# Patient Record
Sex: Female | Born: 1955 | Race: Black or African American | Hispanic: No | Marital: Single | Smoking: Never smoker
Health system: Southern US, Community
[De-identification: ages and names within clinical notes are randomized; demographics above are authoritative.]

## PROBLEM LIST (undated history)

## (undated) DIAGNOSIS — D649 Anemia, unspecified: Secondary | ICD-10-CM

## (undated) DIAGNOSIS — I4891 Unspecified atrial fibrillation: Secondary | ICD-10-CM

## (undated) DIAGNOSIS — E119 Type 2 diabetes mellitus without complications: Secondary | ICD-10-CM

## (undated) DIAGNOSIS — G43909 Migraine, unspecified, not intractable, without status migrainosus: Secondary | ICD-10-CM

## (undated) DIAGNOSIS — F329 Major depressive disorder, single episode, unspecified: Secondary | ICD-10-CM

## (undated) DIAGNOSIS — M797 Fibromyalgia: Secondary | ICD-10-CM

## (undated) DIAGNOSIS — I3139 Other pericardial effusion (noninflammatory): Secondary | ICD-10-CM

## (undated) DIAGNOSIS — E240 Pituitary-dependent Cushing's disease: Secondary | ICD-10-CM

## (undated) DIAGNOSIS — G5622 Lesion of ulnar nerve, left upper limb: Secondary | ICD-10-CM

## (undated) DIAGNOSIS — I10 Essential (primary) hypertension: Secondary | ICD-10-CM

## (undated) DIAGNOSIS — D3500 Benign neoplasm of unspecified adrenal gland: Secondary | ICD-10-CM

## (undated) DIAGNOSIS — E785 Hyperlipidemia, unspecified: Secondary | ICD-10-CM

## (undated) DIAGNOSIS — J45909 Unspecified asthma, uncomplicated: Secondary | ICD-10-CM

## (undated) DIAGNOSIS — F32A Depression, unspecified: Secondary | ICD-10-CM

## (undated) DIAGNOSIS — N189 Chronic kidney disease, unspecified: Secondary | ICD-10-CM

## (undated) DIAGNOSIS — I313 Pericardial effusion (noninflammatory): Secondary | ICD-10-CM

## (undated) DIAGNOSIS — T783XXA Angioneurotic edema, initial encounter: Secondary | ICD-10-CM

## (undated) DIAGNOSIS — G5711 Meralgia paresthetica, right lower limb: Secondary | ICD-10-CM

## (undated) DIAGNOSIS — L509 Urticaria, unspecified: Secondary | ICD-10-CM

## (undated) DIAGNOSIS — E039 Hypothyroidism, unspecified: Secondary | ICD-10-CM

## (undated) DIAGNOSIS — R809 Proteinuria, unspecified: Secondary | ICD-10-CM

## (undated) DIAGNOSIS — M069 Rheumatoid arthritis, unspecified: Secondary | ICD-10-CM

## (undated) HISTORY — DX: Type 2 diabetes mellitus without complications: E11.9

## (undated) HISTORY — DX: Fibromyalgia: M79.7

## (undated) HISTORY — DX: Pituitary-dependent Cushing's disease: E24.0

## (undated) HISTORY — DX: Hypothyroidism, unspecified: E03.9

## (undated) HISTORY — DX: Pericardial effusion (noninflammatory): I31.3

## (undated) HISTORY — DX: Anemia, unspecified: D64.9

## (undated) HISTORY — DX: Migraine, unspecified, not intractable, without status migrainosus: G43.909

## (undated) HISTORY — DX: Depression, unspecified: F32.A

## (undated) HISTORY — DX: Major depressive disorder, single episode, unspecified: F32.9

## (undated) HISTORY — PX: ADENOIDECTOMY: SUR15

## (undated) HISTORY — DX: Other pericardial effusion (noninflammatory): I31.39

## (undated) HISTORY — DX: Unspecified asthma, uncomplicated: J45.909

## (undated) HISTORY — DX: Angioneurotic edema, initial encounter: T78.3XXA

## (undated) HISTORY — PX: APPENDECTOMY: SHX54

## (undated) HISTORY — DX: Benign neoplasm of unspecified adrenal gland: D35.00

## (undated) HISTORY — DX: Urticaria, unspecified: L50.9

## (undated) HISTORY — DX: Essential (primary) hypertension: I10

## (undated) HISTORY — PX: TOTAL VAGINAL HYSTERECTOMY: SHX2548

## (undated) HISTORY — DX: Proteinuria, unspecified: R80.9

## (undated) HISTORY — PX: CYST REMOVAL LEG: SHX6280

## (undated) HISTORY — PX: OTHER SURGICAL HISTORY: SHX169

## (undated) HISTORY — PX: TONSILLECTOMY: SUR1361

## (undated) HISTORY — DX: Unspecified atrial fibrillation: I48.91

## (undated) HISTORY — DX: Meralgia paresthetica, right lower limb: G57.11

## (undated) HISTORY — DX: Lesion of ulnar nerve, left upper limb: G56.22

## (undated) HISTORY — PX: UMBILICAL HERNIA REPAIR: SUR1181

## (undated) HISTORY — DX: Hyperlipidemia, unspecified: E78.5

## (undated) HISTORY — DX: Rheumatoid arthritis, unspecified: M06.9

---

## 1999-04-24 ENCOUNTER — Encounter: Admission: RE | Admit: 1999-04-24 | Discharge: 1999-07-23 | Payer: Self-pay | Admitting: Endocrinology

## 1999-05-25 ENCOUNTER — Encounter (HOSPITAL_BASED_OUTPATIENT_CLINIC_OR_DEPARTMENT_OTHER): Payer: Self-pay | Admitting: General Surgery

## 1999-05-29 ENCOUNTER — Ambulatory Visit (HOSPITAL_COMMUNITY): Admission: RE | Admit: 1999-05-29 | Discharge: 1999-05-30 | Payer: Self-pay | Admitting: General Surgery

## 2000-02-06 ENCOUNTER — Other Ambulatory Visit: Admission: RE | Admit: 2000-02-06 | Discharge: 2000-02-06 | Payer: Self-pay | Admitting: Obstetrics and Gynecology

## 2001-05-13 ENCOUNTER — Other Ambulatory Visit: Admission: RE | Admit: 2001-05-13 | Discharge: 2001-05-13 | Payer: Self-pay | Admitting: Obstetrics and Gynecology

## 2001-05-14 ENCOUNTER — Encounter: Admission: RE | Admit: 2001-05-14 | Discharge: 2001-05-14 | Payer: Self-pay | Admitting: Obstetrics and Gynecology

## 2001-05-14 ENCOUNTER — Encounter: Payer: Self-pay | Admitting: Obstetrics and Gynecology

## 2002-06-23 ENCOUNTER — Other Ambulatory Visit: Admission: RE | Admit: 2002-06-23 | Discharge: 2002-06-23 | Payer: Self-pay | Admitting: Obstetrics and Gynecology

## 2003-07-29 ENCOUNTER — Encounter: Payer: Self-pay | Admitting: Obstetrics and Gynecology

## 2003-07-29 ENCOUNTER — Encounter: Admission: RE | Admit: 2003-07-29 | Discharge: 2003-07-29 | Payer: Self-pay | Admitting: Obstetrics and Gynecology

## 2005-01-15 ENCOUNTER — Encounter: Admission: RE | Admit: 2005-01-15 | Discharge: 2005-01-15 | Payer: Self-pay

## 2005-07-23 ENCOUNTER — Emergency Department (HOSPITAL_COMMUNITY): Admission: EM | Admit: 2005-07-23 | Discharge: 2005-07-23 | Payer: Self-pay | Admitting: Emergency Medicine

## 2006-03-27 ENCOUNTER — Encounter: Admission: RE | Admit: 2006-03-27 | Discharge: 2006-03-27 | Payer: Self-pay | Admitting: Endocrinology

## 2006-06-18 ENCOUNTER — Encounter: Admission: RE | Admit: 2006-06-18 | Discharge: 2006-06-18 | Payer: Self-pay | Admitting: Endocrinology

## 2006-07-16 ENCOUNTER — Encounter: Admission: RE | Admit: 2006-07-16 | Discharge: 2006-07-16 | Payer: Self-pay | Admitting: Obstetrics and Gynecology

## 2006-10-28 ENCOUNTER — Emergency Department (HOSPITAL_COMMUNITY): Admission: EM | Admit: 2006-10-28 | Discharge: 2006-10-28 | Payer: Self-pay | Admitting: Family Medicine

## 2007-03-26 ENCOUNTER — Encounter: Admission: RE | Admit: 2007-03-26 | Discharge: 2007-03-26 | Payer: Self-pay | Admitting: Endocrinology

## 2007-04-18 ENCOUNTER — Ambulatory Visit (HOSPITAL_COMMUNITY): Admission: RE | Admit: 2007-04-18 | Discharge: 2007-04-18 | Payer: Self-pay | Admitting: *Deleted

## 2007-04-18 ENCOUNTER — Encounter (INDEPENDENT_AMBULATORY_CARE_PROVIDER_SITE_OTHER): Payer: Self-pay | Admitting: *Deleted

## 2007-10-08 ENCOUNTER — Ambulatory Visit (HOSPITAL_COMMUNITY): Admission: RE | Admit: 2007-10-08 | Discharge: 2007-10-08 | Payer: Self-pay | Admitting: Cardiology

## 2007-11-14 ENCOUNTER — Encounter: Admission: RE | Admit: 2007-11-14 | Discharge: 2007-11-14 | Payer: Self-pay | Admitting: Obstetrics and Gynecology

## 2009-01-14 ENCOUNTER — Encounter: Admission: RE | Admit: 2009-01-14 | Discharge: 2009-01-14 | Payer: Self-pay | Admitting: Internal Medicine

## 2009-01-26 ENCOUNTER — Encounter: Admission: RE | Admit: 2009-01-26 | Discharge: 2009-01-26 | Payer: Self-pay | Admitting: Endocrinology

## 2010-08-17 ENCOUNTER — Encounter: Admission: RE | Admit: 2010-08-17 | Discharge: 2010-08-17 | Payer: Self-pay | Admitting: Obstetrics and Gynecology

## 2011-03-13 NOTE — Op Note (Signed)
NAMEJOEE, IOVINE                  ACCOUNT NO.:  [id number]   MEDICAL RECORD NO.:  [id number]          PATIENT TYPE:  AMB   LOCATION:  ENDO                         FACILITY:  [facility]   PHYSICIAN:  Georgiana Spinner, M.D.    DATE OF BIRTH:  April 21, 1956   DATE OF PROCEDURE:  04/18/2007  DATE OF DISCHARGE:                               OPERATIVE REPORT   PROCEDURE:  Upper endoscopy.   INDICATIONS:  Gastroesophageal reflux disease.   ANESTHESIA:  Demerol 75 mg, Versed 7 mg.   DESCRIPTION OF PROCEDURE:  With the patient mildly sedated in the left  lateral decubitus position, the Pentax videoscopic endoscope was  inserted into the mouth and passed under direct vision through the  esophagus which appeared normal.  There was no evidence of Barrett's  esophagus.  We entered into the stomach.  The fundus and body appeared  normal.  The antrum showed patchy area of gastritis that was  photographed and biopsied.  The duodenal bulb and second portion of the  duodenum appeared normal.  From this point, the endoscope was slowly  withdrawn taking circumferential views of the duodenal mucosa until the  endoscope had been pulled back into the stomach and placed in  retroflexion to view the stomach from below. The endoscope was  straightened and withdrawn taking circumferential views of the remaining  gastric and esophageal mucosa.  The patient's vital signs and pulse  oximeter remained stable.  The patient tolerated the procedure well  without apparent complications.   FINDINGS:  Patchy area of gastritis biopsied.  Await biopsy report.  The  patient will call me for results and follow-up with me as an outpatient.  Proceed to colonoscopy.           ______________________________  Georgiana Spinner, M.D.     GMO/MEDQ  D:  04/18/2007  T:  04/18/2007  Job:  [id number]

## 2011-03-13 NOTE — Op Note (Signed)
Jocelyn Kaufman, Jocelyn Kaufman                  ACCOUNT NO.:  [id number]   MEDICAL RECORD NO.:  [id number]          PATIENT TYPE:  AMB   LOCATION:  ENDO                         FACILITY:  [facility]   PHYSICIAN:  Georgiana Spinner, M.D.    DATE OF BIRTH:  04-20-1956   DATE OF PROCEDURE:  04/18/2007  DATE OF DISCHARGE:                               OPERATIVE REPORT   PROCEDURE:  Colonoscopy.   INDICATIONS:  Colon cancer screening.   ANESTHESIA:  Demerol 75 mg, Versed 5 mg.   DESCRIPTION OF PROCEDURE:  With the patient mildly sedated in the left  lateral decubitus position, the Pentax videoscopic colonoscope was  inserted in the rectum and passed under direct vision to the cecum  identified by the ileocecal valve and appendiceal orifice, both which  were photographed. From this point, the colonoscope was slowly withdrawn  taking circumferential views of the colonic mucosa as we withdrew all  the way to the rectum which appeared normal on direct and showed  hemorrhoids on retroflexed view. The endoscope was straightened and  withdrawn.  The patient's vital signs and pulse oximeter remained  stable.  The patient tolerated the procedure well without apparent  complications.   FINDINGS:  Internal hemorrhoids, otherwise, unremarkable colonoscopic  examination to the cecum.   PLAN:  Consider repeat examination in 5-10 years.  See endoscopy note  for further details of follow-up.           ______________________________  Georgiana Spinner, M.D.     GMO/MEDQ  D:  04/18/2007  T:  04/18/2007  Job:  [id number]

## 2011-03-13 NOTE — Cardiovascular Report (Signed)
Jocelyn Kaufman, Jocelyn Kaufman                  ACCOUNT NO.:  [id number]   MEDICAL RECORD NO.:  [id number]          PATIENT TYPE:  OIB   LOCATION:  2899                         FACILITY:  [facility]   PHYSICIAN:  Antionette Char, MD    DATE OF BIRTH:  11-Mar-1956   DATE OF PROCEDURE:  10/08/2007  DATE OF DISCHARGE:                            CARDIAC CATHETERIZATION   SURGEON:  Antionette Char, MD.   PROCEDURES:  1. Left heart catheterization.  2. Coronary cineangiography.  3. Left ventricular angiography.  4. Angio-Seal of the right femoral artery.   INDICATIONS FOR PROCEDURE:  This 55 year old female has had marked  anterior chest pain which led to a treadmill exercise tolerance test on  October 02, 2007.  That test was positive for chest pain and borderline  ST-segment changes and with continuation of her chest pain, she was  scheduled for cardiac cath at [facility].  Her pain was  described as typical angina with a pressure and tightness sensation  occurred with rest and with exertion. She also has a long history of  hypertension.   PROCEDURE:  After signing an informed consent, the patient was  premedicated with 50 mg of Benadryl IV because of her SHELLFISH allergy  history and then brought to the cardiac catheterization lab at [facility]. In the cath lab, her right groin was prepped and draped  in a sterile fashion and anesthetized locally with 1% lidocaine.  A 6-  French introducer sheath was inserted percutaneously into the right  femoral artery. 6-French #4 Judkins coronary catheters were used to make  injections into the coronary arteries.  A 6-French pigtail catheter was  used to measure pressures in the left ventricle and aorta and to make a  midstream injection into the left ventricle.  The patient tolerated the  procedure well and no complications were noted. At the end of the  procedure, the catheter and sheath were removed from the right femoral  artery and  hemostasis was easily obtained with an Angio-Seal closure  system.   MEDICATIONS GIVEN:  Versed 1 mg IV.   HEMODYNAMIC DATA:  There was no gradient across the aortic valve.   CINE FINDINGS:  CORONARY CINEANGIOGRAPHY LEFT CORONARY ARTERY:  The  ostium and left main appear normal. Left anterior descending appears  normal.  Circumflex coronary artery appears normal. Right coronary  artery appears normal.   LEFT VENTRICULAR CINE ANGIOGRAM:  The left ventricular chamber size  contractility and wall thickness appear normal.  The left ventricular  contraction pattern is normal with an ejection fraction estimated  between 60 and 70%.  There were no abnormally moving segments.  The  mitral valve appeared normal.  There was no mitral insufficiency.   FINAL DIAGNOSIS:  1. Normal coronary arteries.  2. Normal left ventricular function, ejection fraction 60-70%.  3. Successful Angio-Seal of the right femoral artery.   DISPOSITION:  Will continue on medical treatment.  We will discharge her  home today when stable and have a follow-up office visit in 2 weeks.      Jonny Ruiz  Felipe Drone, MD  Electronically Signed     JRT/MEDQ  D:  10/08/2007  T:  10/08/2007  Job:  [id number]

## 2011-07-06 ENCOUNTER — Ambulatory Visit (HOSPITAL_COMMUNITY)
Admission: RE | Admit: 2011-07-06 | Discharge: 2011-07-06 | Disposition: A | Discharge status: Home / Self Care | Payer: BC Managed Care – PPO | Source: Ambulatory Visit | Attending: Internal Medicine | Admitting: Internal Medicine

## 2011-07-06 DIAGNOSIS — E785 Hyperlipidemia, unspecified: Secondary | ICD-10-CM | POA: Insufficient documentation

## 2011-07-06 DIAGNOSIS — E119 Type 2 diabetes mellitus without complications: Secondary | ICD-10-CM | POA: Insufficient documentation

## 2011-07-06 DIAGNOSIS — R209 Unspecified disturbances of skin sensation: Secondary | ICD-10-CM | POA: Insufficient documentation

## 2011-07-06 DIAGNOSIS — R9439 Abnormal result of other cardiovascular function study: Secondary | ICD-10-CM | POA: Insufficient documentation

## 2011-07-06 DIAGNOSIS — I519 Heart disease, unspecified: Secondary | ICD-10-CM | POA: Insufficient documentation

## 2011-07-06 DIAGNOSIS — R079 Chest pain, unspecified: Secondary | ICD-10-CM | POA: Insufficient documentation

## 2011-07-06 DIAGNOSIS — R0602 Shortness of breath: Secondary | ICD-10-CM | POA: Insufficient documentation

## 2011-07-06 DIAGNOSIS — I10 Essential (primary) hypertension: Secondary | ICD-10-CM | POA: Insufficient documentation

## 2011-07-06 LAB — GLUCOSE, CAPILLARY
Glucose-Capillary: 318 mg/dL — ABNORMAL HIGH (ref 70–99)
Glucose-Capillary: 303 mg/dL — ABNORMAL HIGH (ref 70–99)
Glucose-Capillary: 239 mg/dL — ABNORMAL HIGH (ref 70–99)

## 2011-07-08 NOTE — Cardiovascular Report (Signed)
  Jocelyn, Kaufman NO.:  [id number]  MEDICAL RECORD NO.:  [id number]  LOCATION:  [facility]                         FACILITY:  [facility]  PHYSICIAN:  [place] Faelyn Sigler, MD         DATE OF BIRTH:  1956/02/12  DATE OF PROCEDURE:  07/06/2011 DATE OF DISCHARGE:                           CARDIAC CATHETERIZATION   OPERATOR:  Dr. [place] Jaxie Racanelli.  This is a left heart catheterization.  INDICATIONS:  Shortness of breath and abnormal nuclear stress test indicating anterior ischemia.  HISTORY OF PRESENT ILLNESS:  [name] is a 55 year old female who had presented to the office with increasing shortness of breath, chest pressure, numbness on the left side of her chest.  Her nuclear stress test was performed which showed a small area of anterior reversibility. It could represent shifting breast artifact or anterior wall ischemia. After some discussion, she was agreeable to cardiac catheterization. She had a previous cardiac catheterization in 2008 which showed normal coronaries.  PROCEDURE:  After informed consent was obtained and procedure on radiation safety, time-out, the patient was brought to cardiac catheterization lab, sterilely prepped and draped in usual fashion.  The area around the right radial artery was identified and a positive Allen's test was noted.  The right radial artery was then accessed with a straight needle and a wire and a 5-French right radial access sheath was placed.  Subsequently the patient underwent cardiac catheterization with a 5-French TIG 4.0 catheter and a JR-4 catheter as the TIG 4.0 was unable to cannulate the right coronary.  Estimated blood loss was less than 10 mL.  There were no acute complications.  The patient received 2 mg of Versed and 50 mcg of fentanyl for moderate sedation and 2 mL of 1% lidocaine for local anesthesia.  A total of 5 mL of radial cocktail were given during the case to prevent spasm.  FINDINGS:  Angiographically  normal coronary arteries.  There is a small left circumflex artery which branches high on the lateral wall and a large dominant right coronary system.  PLAN:  I suspect she had a false positive nuclear stress test possibly due to shifting breast artifact.  At this point, we will continue to workup noncardiac causes of her symptoms and I will contact her for office followup.     [place] Rathana Viveros, MD    CH/MEDQ  D:  07/06/2011  T:  07/06/2011  Job:  [id number]  cc:   Brooke Bonito, M.D.  Electronically Signed by Kirtland Bouchard. Maricus Tanzi M.D. on 07/08/2011 12:28:39 PM

## 2013-04-30 ENCOUNTER — Encounter: Payer: Self-pay | Admitting: *Deleted

## 2013-04-30 ENCOUNTER — Encounter: Payer: BC Managed Care – PPO | Attending: Endocrinology | Admitting: *Deleted

## 2013-04-30 VITALS — Ht 64.0 in | Wt 227.2 lb

## 2013-04-30 DIAGNOSIS — Z713 Dietary counseling and surveillance: Secondary | ICD-10-CM | POA: Insufficient documentation

## 2013-04-30 DIAGNOSIS — IMO0001 Reserved for inherently not codable concepts without codable children: Secondary | ICD-10-CM

## 2013-04-30 DIAGNOSIS — E119 Type 2 diabetes mellitus without complications: Secondary | ICD-10-CM | POA: Insufficient documentation

## 2013-04-30 NOTE — Progress Notes (Signed)
  Medical Nutrition Therapy:  Appt start time: 1500 end time:  1600.  Assessment:  Primary concerns today: diabetes. 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 SMBG once in AM, typical range is 109 - 243 mg/dl. Takes Novolog insulin thru V-Go 40. Boluses 3 clicks at each meal (6 units)  MEDICATIONS: see list   DIETARY INTAKE:  Usual eating pattern includes 3 meals and 1-2 snacks per day.  Everyday foods include good variety of all food groups.  Avoided foods include regular sodas.    24-hr recall:  B ( AM): raisin bran with 2% milk, large coffee with 1 tsp sugar, occasionally juice  Snk ( AM): occasionally fresh fruit  L ( PM): buys or brings from home: left overs, sandwich or salad, water or low calorie Gatorade or diet soda Snk ( PM): occasionally a crunchy food like Cheetos D ( PM): prepares a hot meal: pasta with low sodium tomatoes with or without meat, salad or dark green vegetable, water or low calorie Gatorade or diet soda Snk ( PM): popcorn OR Sweet and spicy chili Doritos OR chocolate Beverages: water or low calorie Gatorade or diet soda, coffee  Usual physical activity: walks as able up to 3 days a week for 20 minutes  Estimated energy needs: 1400 calories 158 g carbohydrates 105 g protein 39 g fat  Progress Towards Goal(s):  In progress.   Nutritional Diagnosis:  NB-1.1 Food and nutrition-related knowledge deficit As related to diabetes control.  As evidenced by A1c of 9.6% on 04/11/2013.    Intervention:  Nutrition carb counting with V-Go insulin delivery device. Discussed moving toward a more intensive insulin delivery with use of Carb Ratio of 1 Click (2 units) per carb choice of 15 grams with relates to 1 unit / 7.5 grams CHO if OK with MD.  Plan:  Aim for 3 Carb Choices per meal (45 grams) +/- 1 either way  Aim for 0-2 Carbs per snack if hungry  Consider reading food labels for Total Carbohydrate of foods Consider checking  BG at alternate times per day to collect more data  Consider 1 Click from V-Go for each Carb Choice or each 15 grams of carbohydrate that you eat  Handouts given during visit include: Living Well with Diabetes Carb Counting and Food Label handouts Meal Plan Card Insulin Action handout  Monitoring/Evaluation:  Dietary intake, exercise, reading food labels,, and body weight prn. Patient states she will call if any questions or concerns in future.

## 2013-04-30 NOTE — Patient Instructions (Addendum)
Plan:  Aim for 3 Carb Choices per meal (45 grams) +/- 1 either way  Aim for 0-2 Carbs per snack if hungry  Consider reading food labels for Total Carbohydrate of foods Consider checking BG at alternate times per day to collect more data  Consider 1 Click from V-Go for each Carb Choice or each 15 grams of carbohydrate that you eat

## 2013-06-02 ENCOUNTER — Ambulatory Visit: Payer: BC Managed Care – PPO | Admitting: *Deleted

## 2014-09-08 ENCOUNTER — Other Ambulatory Visit: Payer: Self-pay | Admitting: Gynecology

## 2014-09-08 DIAGNOSIS — N644 Mastodynia: Secondary | ICD-10-CM

## 2014-09-21 ENCOUNTER — Ambulatory Visit
Admission: RE | Admit: 2014-09-21 | Discharge: 2014-09-21 | Disposition: A | Discharge status: Home / Self Care | Payer: BC Managed Care – PPO | Source: Ambulatory Visit | Attending: Gynecology | Admitting: Gynecology

## 2014-09-21 DIAGNOSIS — N644 Mastodynia: Secondary | ICD-10-CM

## 2015-07-09 DIAGNOSIS — H1010 Acute atopic conjunctivitis, unspecified eye: Secondary | ICD-10-CM | POA: Insufficient documentation

## 2015-07-09 DIAGNOSIS — K219 Gastro-esophageal reflux disease without esophagitis: Secondary | ICD-10-CM | POA: Insufficient documentation

## 2015-07-09 DIAGNOSIS — J309 Allergic rhinitis, unspecified: Principal | ICD-10-CM

## 2015-07-09 DIAGNOSIS — J45909 Unspecified asthma, uncomplicated: Secondary | ICD-10-CM | POA: Insufficient documentation

## 2015-08-16 ENCOUNTER — Ambulatory Visit (INDEPENDENT_AMBULATORY_CARE_PROVIDER_SITE_OTHER): Payer: BC Managed Care – PPO | Admitting: *Deleted

## 2015-08-16 DIAGNOSIS — J309 Allergic rhinitis, unspecified: Secondary | ICD-10-CM | POA: Diagnosis not present

## 2015-08-30 ENCOUNTER — Ambulatory Visit (INDEPENDENT_AMBULATORY_CARE_PROVIDER_SITE_OTHER): Payer: BC Managed Care – PPO | Admitting: *Deleted

## 2015-08-30 DIAGNOSIS — J309 Allergic rhinitis, unspecified: Secondary | ICD-10-CM | POA: Diagnosis not present

## 2015-09-03 ENCOUNTER — Other Ambulatory Visit: Payer: Self-pay | Admitting: Endocrinology

## 2015-09-03 DIAGNOSIS — E049 Nontoxic goiter, unspecified: Secondary | ICD-10-CM

## 2015-09-03 DIAGNOSIS — D3501 Benign neoplasm of right adrenal gland: Secondary | ICD-10-CM

## 2015-09-07 ENCOUNTER — Ambulatory Visit (INDEPENDENT_AMBULATORY_CARE_PROVIDER_SITE_OTHER): Payer: BC Managed Care – PPO | Admitting: Neurology

## 2015-09-07 DIAGNOSIS — J309 Allergic rhinitis, unspecified: Secondary | ICD-10-CM | POA: Diagnosis not present

## 2015-09-08 ENCOUNTER — Other Ambulatory Visit: Payer: Self-pay | Admitting: Endocrinology

## 2015-09-08 DIAGNOSIS — D3501 Benign neoplasm of right adrenal gland: Secondary | ICD-10-CM

## 2015-09-09 ENCOUNTER — Ambulatory Visit
Admission: RE | Admit: 2015-09-09 | Discharge: 2015-09-09 | Disposition: A | Discharge status: Home / Self Care | Payer: BC Managed Care – PPO | Source: Ambulatory Visit | Attending: Endocrinology | Admitting: Endocrinology

## 2015-09-09 DIAGNOSIS — D3501 Benign neoplasm of right adrenal gland: Secondary | ICD-10-CM

## 2015-09-09 DIAGNOSIS — E049 Nontoxic goiter, unspecified: Secondary | ICD-10-CM

## 2015-09-15 ENCOUNTER — Ambulatory Visit (INDEPENDENT_AMBULATORY_CARE_PROVIDER_SITE_OTHER): Payer: BC Managed Care – PPO

## 2015-09-15 DIAGNOSIS — J309 Allergic rhinitis, unspecified: Secondary | ICD-10-CM | POA: Diagnosis not present

## 2015-09-20 ENCOUNTER — Ambulatory Visit (INDEPENDENT_AMBULATORY_CARE_PROVIDER_SITE_OTHER): Payer: BC Managed Care – PPO | Admitting: *Deleted

## 2015-09-20 DIAGNOSIS — J309 Allergic rhinitis, unspecified: Secondary | ICD-10-CM

## 2015-10-20 ENCOUNTER — Ambulatory Visit (INDEPENDENT_AMBULATORY_CARE_PROVIDER_SITE_OTHER): Payer: BC Managed Care – PPO

## 2015-10-20 DIAGNOSIS — J309 Allergic rhinitis, unspecified: Secondary | ICD-10-CM | POA: Diagnosis not present

## 2015-11-04 DIAGNOSIS — J301 Allergic rhinitis due to pollen: Secondary | ICD-10-CM | POA: Diagnosis not present

## 2015-11-07 DIAGNOSIS — J3089 Other allergic rhinitis: Secondary | ICD-10-CM | POA: Diagnosis not present

## 2015-11-29 ENCOUNTER — Ambulatory Visit (INDEPENDENT_AMBULATORY_CARE_PROVIDER_SITE_OTHER): Payer: BC Managed Care – PPO

## 2015-11-29 DIAGNOSIS — J309 Allergic rhinitis, unspecified: Secondary | ICD-10-CM | POA: Diagnosis not present

## 2016-01-10 ENCOUNTER — Ambulatory Visit (INDEPENDENT_AMBULATORY_CARE_PROVIDER_SITE_OTHER): Payer: BC Managed Care – PPO

## 2016-01-10 DIAGNOSIS — J309 Allergic rhinitis, unspecified: Secondary | ICD-10-CM

## 2016-02-09 ENCOUNTER — Ambulatory Visit (INDEPENDENT_AMBULATORY_CARE_PROVIDER_SITE_OTHER): Payer: BC Managed Care – PPO

## 2016-02-09 DIAGNOSIS — J309 Allergic rhinitis, unspecified: Secondary | ICD-10-CM

## 2016-03-01 DIAGNOSIS — Z8639 Personal history of other endocrine, nutritional and metabolic disease: Secondary | ICD-10-CM | POA: Insufficient documentation

## 2016-03-13 ENCOUNTER — Ambulatory Visit (INDEPENDENT_AMBULATORY_CARE_PROVIDER_SITE_OTHER): Payer: BC Managed Care – PPO

## 2016-03-13 DIAGNOSIS — J309 Allergic rhinitis, unspecified: Secondary | ICD-10-CM

## 2016-04-18 ENCOUNTER — Ambulatory Visit (INDEPENDENT_AMBULATORY_CARE_PROVIDER_SITE_OTHER): Payer: BC Managed Care – PPO

## 2016-04-18 DIAGNOSIS — J309 Allergic rhinitis, unspecified: Secondary | ICD-10-CM | POA: Diagnosis not present

## 2016-04-27 ENCOUNTER — Ambulatory Visit (INDEPENDENT_AMBULATORY_CARE_PROVIDER_SITE_OTHER): Payer: BC Managed Care – PPO | Admitting: *Deleted

## 2016-04-27 DIAGNOSIS — J309 Allergic rhinitis, unspecified: Secondary | ICD-10-CM

## 2016-05-04 ENCOUNTER — Ambulatory Visit (INDEPENDENT_AMBULATORY_CARE_PROVIDER_SITE_OTHER): Payer: BC Managed Care – PPO | Admitting: *Deleted

## 2016-05-04 DIAGNOSIS — J309 Allergic rhinitis, unspecified: Secondary | ICD-10-CM | POA: Diagnosis not present

## 2016-05-11 ENCOUNTER — Ambulatory Visit (INDEPENDENT_AMBULATORY_CARE_PROVIDER_SITE_OTHER): Payer: BC Managed Care – PPO | Admitting: *Deleted

## 2016-05-11 DIAGNOSIS — J309 Allergic rhinitis, unspecified: Secondary | ICD-10-CM

## 2016-05-17 ENCOUNTER — Ambulatory Visit (INDEPENDENT_AMBULATORY_CARE_PROVIDER_SITE_OTHER): Payer: BC Managed Care – PPO

## 2016-05-17 DIAGNOSIS — J309 Allergic rhinitis, unspecified: Secondary | ICD-10-CM

## 2016-06-14 ENCOUNTER — Ambulatory Visit (INDEPENDENT_AMBULATORY_CARE_PROVIDER_SITE_OTHER): Payer: BC Managed Care – PPO

## 2016-06-14 DIAGNOSIS — J309 Allergic rhinitis, unspecified: Secondary | ICD-10-CM

## 2016-07-05 ENCOUNTER — Ambulatory Visit (INDEPENDENT_AMBULATORY_CARE_PROVIDER_SITE_OTHER): Payer: BC Managed Care – PPO

## 2016-07-05 DIAGNOSIS — R002 Palpitations: Secondary | ICD-10-CM

## 2016-07-12 ENCOUNTER — Ambulatory Visit (INDEPENDENT_AMBULATORY_CARE_PROVIDER_SITE_OTHER): Payer: BC Managed Care – PPO | Admitting: *Deleted

## 2016-07-12 DIAGNOSIS — J309 Allergic rhinitis, unspecified: Secondary | ICD-10-CM

## 2016-07-18 DIAGNOSIS — J301 Allergic rhinitis due to pollen: Secondary | ICD-10-CM | POA: Diagnosis not present

## 2016-07-19 DIAGNOSIS — J3089 Other allergic rhinitis: Secondary | ICD-10-CM | POA: Diagnosis not present

## 2016-08-16 ENCOUNTER — Ambulatory Visit (INDEPENDENT_AMBULATORY_CARE_PROVIDER_SITE_OTHER): Payer: BC Managed Care – PPO | Admitting: *Deleted

## 2016-08-16 DIAGNOSIS — J309 Allergic rhinitis, unspecified: Secondary | ICD-10-CM | POA: Diagnosis not present

## 2016-08-21 ENCOUNTER — Encounter: Payer: Self-pay | Admitting: Allergy and Immunology

## 2016-08-21 ENCOUNTER — Ambulatory Visit (INDEPENDENT_AMBULATORY_CARE_PROVIDER_SITE_OTHER): Payer: BC Managed Care – PPO | Admitting: Allergy and Immunology

## 2016-08-21 ENCOUNTER — Encounter (INDEPENDENT_AMBULATORY_CARE_PROVIDER_SITE_OTHER): Payer: Self-pay

## 2016-08-21 VITALS — BP 130/70 | HR 88 | Temp 98.7°F | Resp 16 | Ht 64.5 in | Wt 235.4 lb

## 2016-08-21 DIAGNOSIS — J3089 Other allergic rhinitis: Secondary | ICD-10-CM | POA: Diagnosis not present

## 2016-08-21 DIAGNOSIS — J4520 Mild intermittent asthma, uncomplicated: Secondary | ICD-10-CM

## 2016-08-21 MED ORDER — EPINEPHRINE 0.3 MG/0.3ML IJ SOAJ
INTRAMUSCULAR | 1 refills | Status: DC
Start: 2016-08-21 — End: 2019-10-19

## 2016-08-21 MED ORDER — ALBUTEROL SULFATE HFA 108 (90 BASE) MCG/ACT IN AERS: INHALATION_SPRAY | RESPIRATORY_TRACT | 1 refills | Status: DC

## 2016-08-21 NOTE — Patient Instructions (Addendum)
  1. OTC Rhinocort one spray each nostril one time per day. Coupon. Takes days to work  2. Proventil HFA 2 puffs every 4-6 hours if needed  3. OTC antihistamine - Claritin/Zyrtec/Allegra - if needed  4. Continue immunotherapy and EpiPen  5. Obtain fall flu vaccine  6. Return to clinic in 1 year or earlier if problem

## 2016-08-21 NOTE — Progress Notes (Signed)
Follow-up Note  Referring Provider: Anda Kraft, MD Primary Provider: Dwan Bolt, MD Date of Office Visit: 08/21/2016  Subjective:   Jocelyn Kaufman (DOB: 02-07-1956) is a 60 y.o. female who returns to [facility] on 08/21/2016 in re-evaluation of the following:  HPI: Talonda returns to this clinic in reevaluation of her allergic rhinitis and intermittent asthma treated with immunotherapy. I've not seen her in his clinic since September 2015.  Her immunotherapy is going quite well. She is able to go through each season of the year now with very little difficulty. She has not had a flare of her asthma requiring a systemic steroid and does not use a short acting bronchodilator. She's no longer on a controller agent. Her nose is a little stuffy on occasion but otherwise has no other upper airway symptoms.  She had been diagnosed with Cushing's syndrome and is on Korlym and is apparently being evaluated for possible removal of a adrenal adenoma.    Medication List      albuterol 108 (90 Base) MCG/ACT inhaler Commonly known as:  PROVENTIL HFA;VENTOLIN HFA Inhale 2 puffs into the lungs daily as needed for wheezing.   amLODipine 5 MG tablet Commonly known as:  NORVASC Take 5 mg by mouth daily.   aspirin 81 MG tablet Take 81 mg by mouth daily.   B-D ULTRAFINE III SHORT PEN 31G X 8 MM Misc Generic drug:  Insulin Pen Needle 1 each by Other route daily.   BASAGLAR KWIKPEN 100 UNIT/ML Sopn Inject 30 Units into the skin daily.   buPROPion 150 MG 24 hr tablet Commonly known as:  WELLBUTRIN XL Take 150 mg by mouth every morning.   celecoxib 200 MG capsule Commonly known as:  CELEBREX Take 200 mg by mouth daily as needed for pain.   cetirizine 10 MG tablet Commonly known as:  ZYRTEC Take 10 mg by mouth daily.   EPIPEN 2-PAK 0.3 mg/0.3 mL Soaj injection Generic drug:  EPINEPHrine Inject 0.3 mg into the muscle once.   escitalopram 20 MG  tablet Commonly known as:  LEXAPRO Take 20 mg by mouth daily.   Fluticasone-Salmeterol 100-50 MCG/DOSE Aepb Commonly known as:  ADVAIR Inhale 1 puff into the lungs every 12 (twelve) hours.   glyBURIDE-metformin 2.5-500 MG tablet Commonly known as:  GLUCOVANCE Take 1 tablet by mouth 2 (two) times daily with a meal.   INVOKANA 300 MG Tabs tablet Generic drug:  canagliflozin Take 300 mg by mouth daily.   KORLYM 300 MG Tabs Generic drug:  MiFEPRIStone Take 1,200 mg by mouth daily.   latanoprost 0.005 % ophthalmic solution Commonly known as:  XALATAN 1 drop at bedtime.   levothyroxine 150 MCG tablet Commonly known as:  SYNTHROID, LEVOTHROID Take 150 mcg by mouth daily before breakfast.   LIVALO 4 MG Tabs Generic drug:  Pitavastatin Calcium Take 4 mg by mouth daily.   meloxicam 15 MG tablet Commonly known as:  MOBIC Take 15 mg by mouth daily.   MICARDIS HCT 80-12.5 MG tablet Generic drug:  telmisartan-hydrochlorothiazide Take 1 tablet by mouth daily.   NOVOLOG [place] Inject 40 Units into the skin daily.   potassium chloride SA 20 MEQ tablet Commonly known as:  K-DUR,KLOR-CON Take 20 mEq by mouth daily.   spironolactone 25 MG tablet Commonly known as:  ALDACTONE Take 75 mg by mouth daily.   torsemide 20 MG tablet Commonly known as:  DEMADEX Take 20 mg by mouth as needed.   TRAVATAN  Z 0.004 % Soln ophthalmic solution Generic drug:  Travoprost (BAK Free) Place 0.004 drops into both eyes daily.   TRILIPIX 135 MG capsule Generic drug:  Choline Fenofibrate Take 135 mg by mouth daily.   TRULICITY 1.5 0000000 Sopn Generic drug:  Dulaglutide Inject 1.5 mLs into the skin daily.       Past Medical History:  Diagnosis Date  . Diabetes mellitus without complication ([place])   . Hyperlipidemia     Past Surgical History:  Procedure Laterality Date  . TONSILLECTOMY      Allergies  Allergen Reactions  . Percocet [Oxycodone-Acetaminophen] Nausea And Vomiting  .  Prilosec [Omeprazole]   . Victoza [Liraglutide] Itching  . Rogaine [Minoxidil] Rash    Review of systems negative except as noted in HPI / PMHx or noted below:  Review of Systems  Constitutional: Negative.   HENT: Negative.   Eyes: Negative.   Respiratory: Negative.   Cardiovascular: Negative.   Gastrointestinal: Negative.   Genitourinary: Negative.   Musculoskeletal: Negative.   Skin: Negative.   Neurological: Negative.   Endo/Heme/Allergies: Negative.   Psychiatric/Behavioral: Negative.      Objective:   Vitals:   08/21/16 1748  BP: 130/70  Pulse: 88  Resp: 16  Temp: 98.7 F (37.1 C)   Height: 5' 4.5" (163.8 cm)  Weight: 235 lb 6.4 oz (106.8 kg)   Physical Exam  Constitutional: She is well-developed, well-nourished, and in no distress.  HENT:  Head: Normocephalic.  Right Ear: Tympanic membrane, external ear and ear canal normal.  Left Ear: Tympanic membrane, external ear and ear canal normal.  Nose: Nose normal. No mucosal edema or rhinorrhea.  Mouth/Throat: Uvula is midline, oropharynx is clear and moist and mucous membranes are normal. No oropharyngeal exudate.  Eyes: Conjunctivae are normal.  Neck: Trachea normal. No tracheal tenderness present. No tracheal deviation present. No thyromegaly present.  Cardiovascular: Normal rate, regular rhythm, S1 normal, S2 normal and normal heart sounds.   No murmur heard. Pulmonary/Chest: Breath sounds normal. No stridor. No respiratory distress. She has no wheezes. She has no rales.  Musculoskeletal: She exhibits no edema.  Lymphadenopathy:       Head (right side): No tonsillar adenopathy present.       Head (left side): No tonsillar adenopathy present.    She has no cervical adenopathy.  Neurological: She is alert. Gait normal.  Skin: No rash noted. She is not diaphoretic. No erythema. Nails show no clubbing.  Psychiatric: Mood and affect normal.    Diagnostics:    Spirometry was performed and demonstrated an  FEV1 of 1.52 at 72 % of predicted.  The patient had an Asthma Control Test with the following results: ACT Total Score: 22.    Assessment and Plan:   1. Other allergic rhinitis   2. Asthma, mild intermittent, well-controlled     1. OTC Rhinocort one spray each nostril one time per day. Coupon. Takes days to work  2. Proventil HFA 2 puffs every 4-6 hours if needed  3. OTC antihistamine - Claritin/Zyrtec/Allegra - if needed  4. Continue immunotherapy and EpiPen  5. Obtain fall flu vaccine  6. Return to clinic in 1 year or earlier if problem  From an atopic respiratory standpoint Mee is doing quite well on her immunotherapy and we'll continue to have her use this form of treatment and see her back in this clinic in 1 year. I did give her a sample of Rhinocort to use for some of her remaining stuffy nose.  She obviously can use a short acting bronchodilator should she develop any problems with her asthma in the future. I've encouraged her to get the flu vaccine this year.  Allena Katz, MD [place]

## 2016-09-14 ENCOUNTER — Other Ambulatory Visit: Payer: Self-pay | Admitting: Occupational Medicine

## 2016-09-14 ENCOUNTER — Ambulatory Visit: Payer: Self-pay

## 2016-09-14 DIAGNOSIS — R0781 Pleurodynia: Secondary | ICD-10-CM

## 2016-09-14 DIAGNOSIS — M79672 Pain in left foot: Secondary | ICD-10-CM

## 2016-09-26 ENCOUNTER — Ambulatory Visit (INDEPENDENT_AMBULATORY_CARE_PROVIDER_SITE_OTHER): Payer: BC Managed Care – PPO | Admitting: *Deleted

## 2016-09-26 DIAGNOSIS — J3089 Other allergic rhinitis: Secondary | ICD-10-CM | POA: Diagnosis not present

## 2016-10-24 ENCOUNTER — Ambulatory Visit (INDEPENDENT_AMBULATORY_CARE_PROVIDER_SITE_OTHER): Payer: BC Managed Care – PPO | Admitting: *Deleted

## 2016-10-24 DIAGNOSIS — J3089 Other allergic rhinitis: Secondary | ICD-10-CM | POA: Diagnosis not present

## 2016-10-24 DIAGNOSIS — J309 Allergic rhinitis, unspecified: Secondary | ICD-10-CM

## 2016-11-17 NOTE — Addendum Note (Signed)
Addended by: Felipa Emory on: 11/17/2016 10:47 AM   Modules accepted: Orders

## 2016-11-27 ENCOUNTER — Ambulatory Visit (INDEPENDENT_AMBULATORY_CARE_PROVIDER_SITE_OTHER): Payer: BC Managed Care – PPO

## 2016-11-27 DIAGNOSIS — J309 Allergic rhinitis, unspecified: Secondary | ICD-10-CM

## 2016-12-06 ENCOUNTER — Ambulatory Visit (INDEPENDENT_AMBULATORY_CARE_PROVIDER_SITE_OTHER): Payer: BC Managed Care – PPO

## 2016-12-06 DIAGNOSIS — J309 Allergic rhinitis, unspecified: Secondary | ICD-10-CM

## 2016-12-11 ENCOUNTER — Ambulatory Visit (INDEPENDENT_AMBULATORY_CARE_PROVIDER_SITE_OTHER): Payer: BC Managed Care – PPO | Admitting: *Deleted

## 2016-12-11 DIAGNOSIS — J309 Allergic rhinitis, unspecified: Secondary | ICD-10-CM | POA: Diagnosis not present

## 2016-12-18 ENCOUNTER — Ambulatory Visit (INDEPENDENT_AMBULATORY_CARE_PROVIDER_SITE_OTHER): Payer: BC Managed Care – PPO

## 2016-12-18 DIAGNOSIS — J309 Allergic rhinitis, unspecified: Secondary | ICD-10-CM | POA: Diagnosis not present

## 2016-12-25 ENCOUNTER — Ambulatory Visit (INDEPENDENT_AMBULATORY_CARE_PROVIDER_SITE_OTHER): Payer: BC Managed Care – PPO | Admitting: *Deleted

## 2016-12-25 DIAGNOSIS — J309 Allergic rhinitis, unspecified: Secondary | ICD-10-CM

## 2017-01-01 ENCOUNTER — Ambulatory Visit: Payer: BC Managed Care – PPO | Admitting: Diagnostic Neuroimaging

## 2017-01-02 ENCOUNTER — Ambulatory Visit: Payer: BC Managed Care – PPO | Admitting: Neurology

## 2017-01-10 ENCOUNTER — Encounter: Payer: Self-pay | Admitting: Neurology

## 2017-01-10 ENCOUNTER — Ambulatory Visit (INDEPENDENT_AMBULATORY_CARE_PROVIDER_SITE_OTHER): Payer: BC Managed Care – PPO | Admitting: Neurology

## 2017-01-10 VITALS — BP 134/67 | HR 76 | Ht 64.5 in | Wt 225.0 lb

## 2017-01-10 DIAGNOSIS — G5711 Meralgia paresthetica, right lower limb: Secondary | ICD-10-CM | POA: Insufficient documentation

## 2017-01-10 DIAGNOSIS — R202 Paresthesia of skin: Secondary | ICD-10-CM | POA: Diagnosis not present

## 2017-01-10 HISTORY — DX: Meralgia paresthetica, right lower limb: G57.11

## 2017-01-10 NOTE — Progress Notes (Signed)
Reason for visit: Numbness  Referring physician: Dr. Marlou Starks is a 61 y.o. female  History of present illness:  Jocelyn Kaufman is a 61 year old right-handed black female with a history of Cushing's disease and diabetes. She is running hemoglobin A1c levels of about 8.3. The patient began noting some intermittent numbness on the right anterolateral thigh over the last one year, but the symptoms have become persistent since January 2018. The patient has also begun to note some numbness in the toes of the feet bilaterally since January 2018 also associated with numbness of the fourth and fifth fingers of the left hand and some weakness of the left hand that began in January 2018. The patient has difficulty spreading the fingers of the left hand apart, she does note some left shoulder discomfort and pain down the entire spine to the low back. She denies pain radiating down arms or the legs, but she does have a rotator cuff problem with the left shoulder. She denies any weakness in the legs per se, but occasionally she may have spasms in the back and feel as if one or the other leg may collapse. The patient has some stress incontinence of the bladder that is chronic in nature, she denies any difficulty controlling the bowels. She has not had any overt falls. She claims that she is sleeping fairly well, the numbness does not keep her awake. The patient is sent to this office for further evaluation.  Past Medical History:  Diagnosis Date  . Cushing disease ([place])   . Depression   . Diabetes mellitus without complication ([place])   . Hyperlipidemia   . Hypertension   . Meralgia paresthetica of right side 01/10/2017  . Migraine     Past Surgical History:  Procedure Laterality Date  . APPENDECTOMY    . CYST REMOVAL LEG Right    Removed from right thigh joint  . TONSILLECTOMY    . TOTAL VAGINAL HYSTERECTOMY    . UMBILICAL HERNIA REPAIR      Family History  Problem Relation Age of Onset  .  Hypertension Mother   . Stroke Mother   . Kidney failure Mother   . Stroke Father   . COPD Father   . Cancer - Prostate Father   . Colon cancer Father     Social history:  reports that she has never smoked. She has never used smokeless tobacco. She reports that she drinks alcohol. She reports that she does not use drugs.  Medications:  Prior to Admission medications   Medication Sig Start Date End Date Taking? Authorizing Provider  albuterol (PROVENTIL HFA;VENTOLIN HFA) 108 (90 Base) MCG/ACT inhaler Inhale two puffs every four to six hours as needed for cough or wheeze. 08/21/16  Yes [name], MD  amLODipine (NORVASC) 5 MG tablet Take 5 mg by mouth daily. 06/04/16  Yes Historical Provider, MD  Aspirin-Acetaminophen-Caffeine (EXCEDRIN MIGRAINE PO) Take 2 tablets by mouth as needed.   Yes Historical Provider, MD  cetirizine (ZYRTEC) 10 MG tablet Take 10 mg by mouth daily.   Yes Historical Provider, MD  escitalopram (LEXAPRO) 20 MG tablet Take 20 mg by mouth daily.   Yes Historical Provider, MD  FERROUS SULFATE PO Take 142 mcg by mouth 3 (three) times a week.   Yes Historical Provider, MD  glyBURIDE-metformin (GLUCOVANCE) 2.5-500 MG per tablet Take 1 tablet by mouth 2 (two) times daily with a meal.   Yes Historical Provider, MD  hydrALAZINE (APRESOLINE) 25  MG tablet Take 25 mg by mouth 2 (two) times daily.   Yes Historical Provider, MD  ibuprofen (ADVIL,MOTRIN) 200 MG tablet Take 200 mg by mouth every 6 (six) hours as needed.   Yes Historical Provider, MD  Insulin Glargine (BASAGLAR KWIKPEN) 100 UNIT/ML SOPN Inject 30-50 Units into the skin daily.  08/01/16  Yes Historical Provider, MD  levothyroxine (SYNTHROID, LEVOTHROID) 150 MCG tablet Take 150 mcg by mouth daily before breakfast.   Yes Historical Provider, MD  LORazepam (ATIVAN) 0.5 MG tablet Take 0.5 mg by mouth as needed for anxiety.   Yes Historical Provider, MD  metaxalone (SKELAXIN) 800 MG tablet Take 800 mg by mouth 3 (three) times  daily as needed for muscle spasms.   Yes Historical Provider, MD  Multiple Vitamins-Minerals (CENTRUM SILVER PO) Take 1 tablet by mouth daily.   Yes Historical Provider, MD  torsemide (DEMADEX) 20 MG tablet Take 20 mg by mouth as needed. 05/23/16  Yes Historical Provider, MD  TRAVATAN Z 0.004 % SOLN ophthalmic solution Place 0.004 drops into both eyes daily. 07/21/16  Yes Historical Provider, MD  TRULICITY 1.5 KK/9.3GH SOPN Inject 1.5 mLs into the skin daily. 07/20/16  Yes Historical Provider, MD  vitamin B-12 (CYANOCOBALAMIN) 100 MCG tablet Take 100 mcg by mouth 3 (three) times a week.   Yes Historical Provider, MD  EPINEPHrine (EPIPEN 2-PAK) 0.3 mg/0.3 mL IJ SOAJ injection Use as directed for life-threatening allergic reaction. Patient not taking: Reported on 01/10/2017 08/21/16   [name], MD  meloxicam (MOBIC) 15 MG tablet Take 15 mg by mouth daily.    Historical Provider, MD  Pitavastatin Calcium (LIVALO) 4 MG TABS Take 4 mg by mouth daily.    Historical Provider, MD      Allergies  Allergen Reactions  . Percocet [Oxycodone-Acetaminophen] Nausea And Vomiting  . Prilosec [Omeprazole]   . Victoza [Liraglutide] Itching  . Rogaine [Minoxidil] Rash    ROS:  Out of a complete 14 system review of symptoms, the patient complains only of the following symptoms, and all other reviewed systems are negative.  Fatigue Snoring Easy bruising Feeling cold Joint pain, achy muscles Allergies, runny nose, skin sensitivity Confusion, headache, numbness, weakness, dizziness Insomnia, sleepiness  Blood pressure 134/67, pulse 76, height 5' 4.5" (1.638 m), weight 225 lb (102.1 kg).  Physical Exam  General: The patient is alert and cooperative at the time of the examination. The patient is markedly obese.  Eyes: Pupils are equal, round, and reactive to light. Discs are flat bilaterally.  Neck: The neck is supple, no carotid bruits are noted.  Respiratory: The respiratory examination is  clear.  Cardiovascular: The cardiovascular examination reveals a regular rate and rhythm, no obvious murmurs or rubs are noted.  Neuromuscular: Range of movement of the cervical spine and lumbar spine are within normal limits.  Skin: Extremities are without significant edema.  Neurologic Exam  Mental status: The patient is alert and oriented x 3 at the time of the examination. The patient has apparent normal recent and remote memory, with an apparently normal attention span and concentration ability.  Cranial nerves: Facial symmetry is present. There is good sensation of the face to pinprick and soft touch bilaterally. The strength of the facial muscles and the muscles to head turning and shoulder shrug are normal bilaterally. Speech is well enunciated, no aphasia or dysarthria is noted. Extraocular movements are full. Visual fields are full. The tongue is midline, and the patient has symmetric elevation of the soft palate.  No obvious hearing deficits are noted.  Motor: The motor testing reveals 5 over 5 strength of all 4 extremities, with exception of some weakness with abduction of the fingers of the left hand, the distal flexors of the fourth and fifth fingers are normal in strength. Good symmetric motor tone is noted throughout.  Sensory: Sensory testing is intact to pinprick, soft touch, vibration sensation, and position sense on all 4 extremities, with exception of some pinprick stocking pattern sensory deficit across the ankles bilaterally, minimal impairment of position sense in both feet. No evidence of extinction is noted.  Coordination: Cerebellar testing reveals good finger-nose-finger and heel-to-shin bilaterally.  Gait and station: Gait is normal. Tandem gait is slightly unsteady. Romberg is negative. No drift is seen. The patient is able to walk on heels and the toes bilaterally.  Reflexes: Deep tendon reflexes are symmetric, but are slightly decreased bilaterally. Toes are  downgoing bilaterally.   Assessment/Plan:  1. 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 Right meralgia paresthetica  3. Left hand weakness, possible distal ulnar neuropathy  The patient will undergo blood work evaluation today, she will be set up for nerve conduction studies of the left arm and both legs, EMG of the right leg and left arm. She will follow-up for the EMG and nerve conduction study evaluation.  Jill Alexanders MD 01/10/2017 9:04 AM  [facility] [address] [place], [place] [address]  Phone [phone] Fax [phone]

## 2017-01-10 NOTE — Patient Instructions (Signed)
   We will check EMG and NCV to look at the nerve function of the left arm and both legs, and we will get blood work today.

## 2017-01-14 ENCOUNTER — Ambulatory Visit (INDEPENDENT_AMBULATORY_CARE_PROVIDER_SITE_OTHER): Payer: BC Managed Care – PPO | Admitting: Neurology

## 2017-01-14 ENCOUNTER — Ambulatory Visit (INDEPENDENT_AMBULATORY_CARE_PROVIDER_SITE_OTHER): Payer: Self-pay | Admitting: Neurology

## 2017-01-14 ENCOUNTER — Encounter: Payer: Self-pay | Admitting: Neurology

## 2017-01-14 DIAGNOSIS — R202 Paresthesia of skin: Secondary | ICD-10-CM | POA: Diagnosis not present

## 2017-01-14 DIAGNOSIS — G5711 Meralgia paresthetica, right lower limb: Secondary | ICD-10-CM

## 2017-01-14 DIAGNOSIS — G5622 Lesion of ulnar nerve, left upper limb: Secondary | ICD-10-CM | POA: Diagnosis not present

## 2017-01-14 HISTORY — DX: Lesion of ulnar nerve, left upper limb: G56.22

## 2017-01-14 LAB — MULTIPLE MYELOMA PANEL, SERUM
Albumin SerPl Elph-Mcnc: 3.3 g/dL (ref 2.9–4.4)
Albumin/Glob SerPl: 1.2 (ref 0.7–1.7)
Alpha 1: 0.2 g/dL (ref 0.0–0.4)
Alpha2 Glob SerPl Elph-Mcnc: 1 g/dL (ref 0.4–1.0)
B-Globulin SerPl Elph-Mcnc: 1.2 g/dL (ref 0.7–1.3)
Gamma Glob SerPl Elph-Mcnc: 0.6 g/dL (ref 0.4–1.8)
Globulin, Total: 3 g/dL (ref 2.2–3.9)
IgA/Immunoglobulin A, Serum: 87 mg/dL (ref 87–352)
IgG (Immunoglobin G), Serum: 657 mg/dL — ABNORMAL LOW (ref 700–1600)
IgM (Immunoglobulin M), Srm: 80 mg/dL (ref 26–217)
Total Protein: 6.3 g/dL (ref 6.0–8.5)

## 2017-01-14 LAB — ANA W/REFLEX: Anti Nuclear Antibody(ANA): NEGATIVE

## 2017-01-14 LAB — VITAMIN B12: Vitamin B-12: 730 pg/mL (ref 232–1245)

## 2017-01-14 LAB — PAN-ANCA
ANCA Proteinase 3: <3.5 U/mL (ref 0.0–3.5)
Atypical pANCA: <1:20 {titer}
C-ANCA: <1:20 {titer}
Myeloperoxidase Ab: <9 U/mL (ref 0.0–9.0)
P-ANCA: <1:20 {titer}

## 2017-01-14 LAB — RHEUMATOID FACTOR: Rhuematoid fact SerPl-aCnc: 12.8 IU/mL (ref 0.0–13.9)

## 2017-01-14 LAB — B. BURGDORFI ANTIBODIES: Lyme IgG/IgM Ab: <0.91 {ISR} (ref 0.00–0.90)

## 2017-01-14 LAB — ANGIOTENSIN CONVERTING ENZYME: Angio Convert Enzyme: 43 U/L (ref 14–82)

## 2017-01-14 LAB — SEDIMENTATION RATE: Sed Rate: 22 mm/hr (ref 0–40)

## 2017-01-14 NOTE — Progress Notes (Signed)
Please refer to EMG and nerve conduction study procedure note. 

## 2017-01-14 NOTE — Procedures (Signed)
HISTORY:  Jocelyn Kaufman is a 61 year old patient with a history of diabetes who complains of some numbness and weakness of the left hand, numbness of the anterior lateral aspect of the right thigh, and numbness in both feet. The patient is being evaluated for a possible neuropathy and for a possible left ulnar neuropathy.  NERVE CONDUCTION STUDIES:  Nerve conduction studies were performed on the left upper extremity. The distal motor latency for the left median nerve was normal, prolonged for the left ulnar nerve. The motor amplitude for the left median nerve was normal, low for the left ulnar nerve. The nerve conduction velocities for the left median nerve were normal, slowed for the left ulnar nerve above and below the elbow. The sensory latency for the left median nerve was slightly prolonged, and was also prolonged for the left ulnar nerve. The F wave latency for the left median nerve was normal, prolonged for the left ulnar nerve.  Nerve conduction studies were performed on both lower extremities. The distal motor latencies for the peroneal nerves were normal bilaterally, with normal motor amplitudes for these nerves bilaterally. The distal motor latencies for the posterior tibial nerves were prolonged bilaterally with low motor amplitudes seen for these nerves bilaterally. The nerve conduction velocity for the right posterior tibial nerve was slowed, normal for the left posterior tibial nerve. The H reflex latencies were unobtainable bilaterally. The peroneal sensory latencies were unobtainable bilaterally.  EMG STUDIES:  EMG study was performed on the left upper extremity:  The first dorsal interosseous muscle reveals 2 to 4 K units with full recruitment. 1+ fibrillations and positive waves were noted. The abductor pollicis brevis muscle reveals 2 to 4 K units with full recruitment. No fibrillations or positive waves were noted. The extensor indicis proprius muscle reveals 1 to 3 K units  with full recruitment. No fibrillations or positive waves were noted. The pronator teres muscle reveals 2 to 3 K units with full recruitment. No fibrillations or positive waves were noted. The biceps muscle reveals 1 to 2 K units with full recruitment. No fibrillations or positive waves were noted. The triceps muscle reveals 2 to 4 K units with full recruitment. No fibrillations or positive waves were noted. The anterior deltoid muscle reveals 2 to 3 K units with full recruitment. No fibrillations or positive waves were noted. The cervical paraspinal muscles were tested at 2 levels. No abnormalities of insertional activity were seen at either level tested. There was good relaxation.  EMG study was performed on the right lower extremity:  The tibialis anterior muscle reveals 2 to 5K motor units with decreased recruitment. No fibrillations or positive waves were seen. The peroneus tertius muscle reveals 2 to 5K motor units with moderately reduced recruitment. No fibrillations or positive waves were seen. The medial gastrocnemius muscle reveals 1 to 3K motor units with slightly reduced recruitment. No fibrillations or positive waves were seen. The vastus lateralis muscle reveals 2 to 4K motor units with full recruitment. No fibrillations or positive waves were seen. The iliopsoas muscle reveals 2 to 4K motor units with full recruitment. No fibrillations or positive waves were seen. The biceps femoris muscle (long head) reveals 2 to 4K motor units with full recruitment. No fibrillations or positive waves were seen. The lumbosacral paraspinal muscles were tested at 3 levels, and revealed no abnormalities of insertional activity at all 3 levels tested. There was good relaxation.   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 There appears to be evidence of a  primarily distal left ulnar neuropathy, and a borderline left carpal tunnel syndrome. EMG evaluation of the left upper extremity confirms the presence of a primarily distal left ulnar neuropathy without evidence of an overlying cervical radiculopathy. EMG evaluation of the right lower extremity shows primarily distal chronic stable signs of denervation consistent with the diagnosis of peripheral neuropathy. There is no evidence of an overlying lumbosacral radiculopathy.  Jill Alexanders MD 01/14/2017 2:42 PM  [facility] [address] [place], [place] [address]  Phone [phone] Fax [phone]

## 2017-01-15 ENCOUNTER — Telehealth: Payer: Self-pay | Admitting: *Deleted

## 2017-01-15 NOTE — Telephone Encounter (Signed)
-----   Message from Kathrynn Ducking, MD sent at 01/14/2017  4:43 PM EDT -----  The blood work results are unremarkable. Please call the patient.  ----- Message ----- From: Lavone Neri Lab Results In Sent: 01/11/2017   7:43 AM To: Kathrynn Ducking, MD

## 2017-01-15 NOTE — Telephone Encounter (Signed)
Noted, thank you

## 2017-01-15 NOTE — Telephone Encounter (Signed)
LVM for pt to call about lab results.   *Ok to inform pt labs unremarkable per CW, MD

## 2017-01-15 NOTE — Telephone Encounter (Signed)
Patient called office and was notified that labs were unremarkable per nurse and Dr. Jannifer Franklin.  Patient voiced understanding

## 2017-01-24 ENCOUNTER — Ambulatory Visit (INDEPENDENT_AMBULATORY_CARE_PROVIDER_SITE_OTHER): Payer: BC Managed Care – PPO | Admitting: *Deleted

## 2017-01-24 ENCOUNTER — Telehealth: Payer: Self-pay | Admitting: Neurology

## 2017-01-24 DIAGNOSIS — J309 Allergic rhinitis, unspecified: Secondary | ICD-10-CM

## 2017-01-24 NOTE — Telephone Encounter (Signed)
Patient calling to find out if there is anything she needs to get besides elbow brace which she has had a hard time finding. What else does she need to do?

## 2017-01-24 NOTE — Telephone Encounter (Signed)
Dr Willis- please advise 

## 2017-01-24 NOTE — Telephone Encounter (Signed)
I called the patient. The patient has had difficulty getting an athletic elbow pad, apparently they are not stocked locally anymore, she may try to get something on [place].  She will want a copy of her EMG study, I will get this for her.

## 2017-02-11 LAB — GLUCOSE, CAPILLARY

## 2017-02-22 ENCOUNTER — Ambulatory Visit (INDEPENDENT_AMBULATORY_CARE_PROVIDER_SITE_OTHER): Payer: BC Managed Care – PPO

## 2017-02-22 DIAGNOSIS — J309 Allergic rhinitis, unspecified: Secondary | ICD-10-CM

## 2017-03-01 ENCOUNTER — Telehealth: Payer: Self-pay | Admitting: Internal Medicine

## 2017-03-01 ENCOUNTER — Ambulatory Visit: Payer: BC Managed Care – PPO | Admitting: Neurology

## 2017-03-01 NOTE — Telephone Encounter (Signed)
5/4/208 Received faxed referral packet from [facility] for upcoming appointment with Dr. Debara Pickett on 03/14/2017.  Records giving to [facility]. cbr

## 2017-03-07 ENCOUNTER — Encounter: Payer: Self-pay | Admitting: Neurology

## 2017-03-07 ENCOUNTER — Ambulatory Visit (INDEPENDENT_AMBULATORY_CARE_PROVIDER_SITE_OTHER): Payer: BC Managed Care – PPO | Admitting: Neurology

## 2017-03-07 VITALS — BP 126/58 | HR 90 | Resp 16 | Ht 64.5 in | Wt 222.0 lb

## 2017-03-07 DIAGNOSIS — G4733 Obstructive sleep apnea (adult) (pediatric): Secondary | ICD-10-CM

## 2017-03-07 DIAGNOSIS — R519 Headache, unspecified: Secondary | ICD-10-CM

## 2017-03-07 DIAGNOSIS — R400 Somnolence: Secondary | ICD-10-CM

## 2017-03-07 DIAGNOSIS — R351 Nocturia: Secondary | ICD-10-CM

## 2017-03-07 DIAGNOSIS — E669 Obesity, unspecified: Secondary | ICD-10-CM | POA: Diagnosis not present

## 2017-03-07 DIAGNOSIS — R51 Headache: Secondary | ICD-10-CM

## 2017-03-07 NOTE — Patient Instructions (Signed)

## 2017-03-07 NOTE — Progress Notes (Signed)
Subjective:    Patient ID: Jocelyn Kaufman is a 61 y.o. female.  HPI     [name], MD, PhD [facility] [address] [address] [place], [place] [address]  Dear Dr. Wilson Singer,   I saw your patient, Jocelyn Kaufman, upon your kind request in my neurologic clinic today for initial consultation of her sleep disorder, in particular, concern for underlying obstructive sleep apnea. The patient is unaccompanied today. As you know, Jocelyn Kaufman is a 61 year old right-handed woman with an underlying medical history of Cushing's disease, depression, diabetes with suboptimal control, hyperlipidemia, hypertension, migraines, meralgia paresthetica on the right side, and paresthesias, and obesity, who reports snoring and excessive daytime somnolence. I reviewed your office note from 02/27/2017, which you kindly included. Her Epworth Sleepiness Scale score is 9 out of 24, fatigue score is 61 out of 63. She reports a prior diagnosis of obstructive sleep apnea and had sleep study testing over 10 years ago. She has not been using CPAP for several years, perhaps for the past 8 years. She is a nonsmoker and drinks alcohol rarely. She drinks caffeine in the form of coffee about 1 12 oz mugs per day. She is single, has no children, she lives alone. She is on medical leave currently.  She has a bedtime of 9-11 PM and WT is 6-8 AM. She denies RLS symptoms, but has numbness in the feet. She reports a family history of sleep apnea and one of her half-sisters and a nephew. She has nocturia about twice per average night and occasional morning headaches. She has her old CPAP machine but could no longer get supplies and her DME company went out of business.  Her Past Medical History Is Significant For: Past Medical History:  Diagnosis Date  . Adrenal adenoma   . Cushing disease ([place])   . Depression   . Diabetes mellitus without complication ([place])   . Hyperlipidemia   . Hypertension   . Hypothyroidism    . Meralgia paresthetica of right side 01/10/2017  . Migraine   . Pericardial effusion   . 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 APPENDECTOMY    . CYST REMOVAL LEG Right    Removed from right thigh joint  . TONSILLECTOMY    . TOTAL VAGINAL HYSTERECTOMY    . UMBILICAL HERNIA REPAIR      Her Family History Is Significant For: Family History  Problem Relation Age of Onset  . Hypertension Mother   . Stroke Mother   . Kidney failure Mother   . Stroke Father   . COPD Father   . Cancer - Prostate Father   . Colon cancer Father     Her Social History Is Significant For: Social History   Social History  . Marital status: Single    Spouse name: N/A  . Number of children: 0  . Years of education: Masters   Occupational History  . [employer]    Social History Main Topics  . Smoking status: Never Smoker  . Smokeless tobacco: Never Used  . Alcohol use Yes     Comment: occ  . Drug use: No  . Sexual activity: No   Other Topics Concern  . None   Social History Narrative   Lives alone   Caffeine use: 2 cups coffee per day   Tea sometimes   Soda- 3 x/week  Right-handed    Her Allergies Are:  Allergies  Allergen Reactions  . Invokana [Canagliflozin] Other (See Comments)    Urinary incontinence  . Percocet [Oxycodone-Acetaminophen] Nausea And Vomiting  . Prilosec [Omeprazole] Rash  . Rogaine [Minoxidil] Rash  . Victoza [Liraglutide] Itching  :   Her Current Medications Are:  Outpatient Encounter Prescriptions as of 03/07/2017  Medication Sig  . albuterol (PROVENTIL HFA;VENTOLIN HFA) 108 (90 Base) MCG/ACT inhaler Inhale two puffs every four to six hours as needed for cough or wheeze.  Marland Kitchen amLODipine (NORVASC) 5 MG tablet Take 5 mg by mouth daily.  . Aspirin-Acetaminophen-Caffeine (EXCEDRIN MIGRAINE PO) Take 2 tablets by mouth as needed.  . Biotin 1  MG CAPS Take 1 % by mouth daily.  . cetirizine (ZYRTEC) 10 MG tablet Take 10 mg by mouth daily.  . Dulaglutide (TRULICITY) 1.5 BJ/6.2GB SOPN Inject into the skin.  Marland Kitchen EPINEPHrine (EPIPEN 2-PAK) 0.3 mg/0.3 mL IJ SOAJ injection Use as directed for life-threatening allergic reaction.  Marland Kitchen escitalopram (LEXAPRO) 20 MG tablet Take 20 mg by mouth daily.  Marland Kitchen FERROUS SULFATE PO Take 142 mcg by mouth daily.   Marland Kitchen glyBURIDE-metformin (GLUCOVANCE) 2.5-500 MG per tablet Take 1 tablet by mouth 2 (two) times daily with a meal.  . hydrALAZINE (APRESOLINE) 25 MG tablet Take 25 mg by mouth 2 (two) times daily.  Marland Kitchen ibuprofen (ADVIL,MOTRIN) 200 MG tablet Take 200 mg by mouth every 6 (six) hours as needed.  . Insulin Glargine (BASAGLAR KWIKPEN) 100 UNIT/ML SOPN Inject 30-50 Units into the skin daily.   . Levothyroxine Sodium 125 MCG CAPS Take 125 mcg by mouth daily before breakfast.   . LORazepam (ATIVAN) 0.5 MG tablet Take 0.5 mg by mouth as needed for anxiety.  . metaxalone (SKELAXIN) 800 MG tablet Take 800 mg by mouth 3 (three) times daily as needed for muscle spasms.  . Multiple Vitamins-Minerals (CENTRUM SILVER PO) Take 1 tablet by mouth daily.  Marland Kitchen torsemide (DEMADEX) 20 MG tablet Take 20 mg by mouth as needed.  . TRAVATAN Z 0.004 % SOLN ophthalmic solution Place 0.004 drops into both eyes daily.  . vitamin B-12 (CYANOCOBALAMIN) 100 MCG tablet Take 100 mcg by mouth 3 (three) times a week.  . [DISCONTINUED] TRULICITY 1.5 TD/1.7OH SOPN Inject 1.5 mLs into the skin daily.  . [DISCONTINUED] meloxicam (MOBIC) 15 MG tablet Take 15 mg by mouth daily.  . [DISCONTINUED] Pitavastatin Calcium (LIVALO) 4 MG TABS Take 4 mg by mouth daily.   No facility-administered encounter medications on file as of 03/07/2017.   :  Review of Systems:  Out of a complete 14 point review of systems, all are reviewed and negative with the exception of these symptoms as listed below: Review of Systems  Neurological:       Patient had a sleep  study about 10+ years ago, last used CPAP about 8 years ago.  Wakes up in the night to go to the bathroom, snores, witnessed apnea, wakes up feeling, sometimes gets morning headaches, daytime fatigue, takes naps during the day.    Epworth Sleepiness Scale 0= would never doze 1= slight chance of dozing 2= moderate chance of dozing 3= high chance of dozing  Sitting and reading: 1 Watching TV:2 Sitting inactive in a public place (ex. Theater or meeting):0 As a passenger in a car for an hour without a break:1 Lying down to rest in the afternoon:3 Sitting and talking to someone:0 Sitting quietly after lunch (no alcohol):2 In a car, while stopped in traffic:0  Total:9   Objective:  Neurologic Exam  Physical Exam Physical Examination:   Vitals:   03/07/17 1342  BP: (!) 126/58  Pulse: 90  Resp: 16   General Examination: The patient is a very pleasant 61 y.o. female in no acute distress. She appears well-developed and well-nourished and well groomed.   HEENT: Normocephalic, atraumatic, pupils are equal, round and reactive to light and accommodation. Funduscopic exam is normal with sharp disc margins noted. Extraocular tracking is good without limitation to gaze excursion or nystagmus noted. Normal smooth pursuit is noted. Hearing is grossly intact. Tympanic membranes are clear bilaterally. Face is symmetric with normal facial animation and normal facial sensation. Speech is clear with no dysarthria noted. There is no hypophonia. There is no lip, neck/head, jaw or voice tremor. Neck is supple with full range of passive and active motion. There are no carotid bruits on auscultation. Oropharynx exam reveals: mild mouth dryness, adequate dental hygiene and moderate airway crowding, due to wider tongue and redundant soft palate. Mallampati is class II. Tongue protrudes centrally and palate elevates symmetrically. Tonsils are absent. Neck size is 16 5/8 inches. She has a Mild overbite.    Chest:  Clear to auscultation without wheezing, rhonchi or crackles noted.  Heart: S1+S2+0, regular and normal without murmurs, rubs or gallops noted.   Abdomen: Soft, non-tender and non-distended with normal bowel sounds appreciated on auscultation.  Extremities: There is no pitting edema in the distal lower extremities bilaterally. Pedal pulses are intact.  Skin: Warm and dry without trophic changes noted. There are no varicose veins in the distal LEs.  Musculoskeletal: exam reveals no obvious joint deformities, tenderness or joint swelling or erythema.   Neurologically:  Mental status: The patient is awake, alert and oriented in all 4 spheres. Her immediate and remote memory, attention, language skills and fund of knowledge are appropriate. There is no evidence of aphasia, agnosia, apraxia or anomia. Speech is clear with normal prosody and enunciation. Thought process is linear. Mood is normal and affect is normal.  Cranial nerves II - XII are as described above under HEENT exam. In addition: shoulder shrug is normal with equal shoulder height noted. Motor exam: Normal bulk, strength and tone is noted. There is no drift, tremor or rebound. Romberg is negative. Reflexes are 1+ throughout. Fine motor skills and coordination: grossly intact.  Cerebellar testing: No dysmetria or intention tremor on finger to nose testing. Heel to shin is unremarkable bilaterally. There is no truncal or gait ataxia.  Sensory exam: intact to light touch in the upper and lower extremities.  Gait, station and balance: She stands with mild difficulty. No veering to one side is noted. No leaning to one side is noted. Posture is age-appropriate and stance is narrow based. Gait shows normal stride length and normal pace. No problems turning are noted. Tandem walk is somewhat difficult for her but doable. Assessment and Plan:  In summary, NICOLLE HEWARD is a very pleasant 61 y.o.-year old female with an underlying medical history of  Cushing's disease, depression, diabetes with suboptimal control, hyperlipidemia, hypertension, migraines, meralgia paresthetica on the right side, and paresthesias, and obesity, whose history and physical exam are in keeping with obstructive sleep apnea (OSA). I had a long chat with the patient about my findings and the diagnosis of OSA, its prognosis and treatment options. We talked about medical treatments, surgical interventions and non-pharmacological approaches. I explained in particular the risks and ramifications of untreated moderate to severe OSA, especially with respect  to developing cardiovascular disease down the Road, including congestive heart failure, difficult to treat hypertension, cardiac arrhythmias, or stroke. Even type 2 diabetes has, in part, been linked to untreated OSA. Symptoms of untreated OSA include daytime sleepiness, memory problems, mood irritability and mood disorder such as depression and anxiety, lack of energy, as well as recurrent headaches, especially morning headaches. We talked about trying to maintain a ealthy lifestyle in general, as well as the importance of weight control. I encouraged the patient to eat healthy, exercise daily and keep well hydrated, to keep a scheduled bedtime and wake time routine, to not skip any meals and eat healthy snacks in between meals. I advised the patient not to drive when feeling sleepy. I recommended the following at this time: sleep study with potential positive airway pressure titration. (We will score hypopneas at 3%).   I explained the sleep test procedure to the patient and also outlined possible surgical and non-surgical treatment options of OSA, including the use of a custom-made dental device (which would require a referral to a specialist dentist or oral surgeon), upper airway surgical options, such as pillar implants, radiofrequency surgery, tongue base surgery, and UPPP (which would involve a referral to an ENT surgeon).  Rarely, jaw surgery such as mandibular advancement may be considered.  I also explained the CPAP treatment option to the patient, who indicated that she would be willing to try CPAP if the need arises. I explained the importance of being compliant with PAP treatment, not only for insurance purposes but primarily to improve Her symptoms, and for the patient's long term health benefit, including to reduce Her cardiovascular risks. I answered all her questions today and the patient was in agreement. I would like to see her back after the sleep study is completed and encouraged her to call with any interim questions, concerns, problems or updates.   Thank you very much for allowing me to participate in the care of this nice patient. If I can be of any further assistance to you please do not hesitate to call me at [phone].  Sincerely,   [name], MD, PhD

## 2017-03-14 ENCOUNTER — Encounter (INDEPENDENT_AMBULATORY_CARE_PROVIDER_SITE_OTHER): Payer: Self-pay

## 2017-03-14 ENCOUNTER — Ambulatory Visit (INDEPENDENT_AMBULATORY_CARE_PROVIDER_SITE_OTHER): Payer: BC Managed Care – PPO | Admitting: Internal Medicine

## 2017-03-14 ENCOUNTER — Encounter: Payer: Self-pay | Admitting: Internal Medicine

## 2017-03-14 VITALS — BP 126/62 | HR 83 | Ht 64.5 in | Wt 227.6 lb

## 2017-03-14 DIAGNOSIS — I313 Pericardial effusion (noninflammatory): Secondary | ICD-10-CM | POA: Diagnosis not present

## 2017-03-14 DIAGNOSIS — E1149 Type 2 diabetes mellitus with other diabetic neurological complication: Secondary | ICD-10-CM | POA: Diagnosis not present

## 2017-03-14 DIAGNOSIS — E119 Type 2 diabetes mellitus without complications: Secondary | ICD-10-CM | POA: Insufficient documentation

## 2017-03-14 DIAGNOSIS — I3139 Other pericardial effusion (noninflammatory): Secondary | ICD-10-CM

## 2017-03-14 DIAGNOSIS — I10 Essential (primary) hypertension: Secondary | ICD-10-CM | POA: Diagnosis not present

## 2017-03-14 DIAGNOSIS — E782 Mixed hyperlipidemia: Secondary | ICD-10-CM

## 2017-03-14 DIAGNOSIS — Z794 Long term (current) use of insulin: Secondary | ICD-10-CM | POA: Diagnosis not present

## 2017-03-14 NOTE — Patient Instructions (Signed)
Medication Instructions:  Your physician recommends that you continue on your current medications as directed. Please refer to the Current Medication list given to you today.  Labwork: none  Testing/Procedures: Your physician has requested that you have an echocardiogram. Echocardiography is a painless test that uses sound waves to create images of your heart. It provides your doctor with information about the size and shape of your heart and how well your heart's chambers and valves are working. This procedure takes approximately one hour. There are no restrictions for this procedure. [address]  Follow-Up: Your physician recommends that you schedule a follow-up appointment in: after Echo  If you need a refill on your cardiac medications before your next appointment, please call your pharmacy.

## 2017-03-14 NOTE — Progress Notes (Signed)
OFFICE CONSULT NOTE  Chief Complaint:  Intermittent chest pressure  Primary Care Physician: Anda Kraft, MD  HPI:  Jocelyn Kaufman is a 61 y.o. female who is being seen today for the evaluation of intermittent chest pressure at the request of Anda Kraft, MD. Jocelyn Kaufman is a 61 year old female with a history of type 2 diabetes, depression, hyperlipidemia, hypertension, left ulnar neuropathy, possible obstructive sleep apnea, and recent extensive workup at the [facility] for possible Cushing's disease. Per their report it does not seem that she has this although she was noted to have bilateral adrenal hyperplasia. I first met Jocelyn Kaufman in 2012 which time she was hospitalized for chest pain and underwent a heart catheterization after an abnormal nuclear stress test, which I performed demonstrating normal coronary arteries. As part of her workup at the [facility] she underwent an echocardiogram. I reviewed the echocardiogram report but did not have the images to directly review. This demonstrated an LVEF of 55%, no regional wall motion abnormalities, mild left atrial enlargement, and a small, hemodynamically significant pericardial effusion. RVSP was normal at 32 mmHg. The study was performed on 02/11/2017. She says that over the past week or 2 she's had some more intermittent chest pressure. This is substernal and does not radiate. It's not worse with exertion or relieved by rest. The etiology of her pericardial effusion is not clear however NIH suggested a workup for immune disorders. When I asked her about family history, she was somewhat unclear about whether autoimmune disorders were in her family. She also inquired today for me to fill out paperwork supporting disability. She is a 6 grade teacher. I reiterated that I did not believe that at this point she hasn't developed disabling cardiovascular condition, although she has multiple medical problems which are currently being addressed by  her primary care provider.  PMHx:  Past Medical History:  Diagnosis Date  . Adrenal adenoma   . Cushing disease ([place])   . Depression   . Diabetes mellitus without complication ([place])   . Hyperlipidemia   . Hypertension   . Hypothyroidism   . Meralgia paresthetica of right side 01/10/2017  . Migraine   . Pericardial effusion   . 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 APPENDECTOMY    . CYST REMOVAL LEG Right    Removed from right thigh joint  . TONSILLECTOMY    . TOTAL VAGINAL HYSTERECTOMY    . UMBILICAL HERNIA REPAIR      FAMHx:  Family History  Problem Relation Age of Onset  . Hypertension Mother   . Stroke Mother   . Kidney failure Mother   . Heart disease Mother        heart murmur  . Glaucoma Mother   . Stroke Father   . COPD Father   . Cancer - Prostate Father   . Colon cancer Father     SOCHx:   reports that she has never smoked. She has never used smokeless tobacco. She reports that she drinks alcohol. She reports that she does not use drugs.  ALLERGIES:  Allergies  Allergen Reactions  . Invokana [Canagliflozin] Other (See Comments)    Urinary incontinence  . Percocet [Oxycodone-Acetaminophen] Nausea And Vomiting  . Prilosec [Omeprazole] Rash  . Rogaine [Minoxidil] Rash  . Victoza [Liraglutide] Itching    ROS: Pertinent items noted in HPI and remainder of comprehensive ROS otherwise negative.  HOME MEDS: Current Outpatient  Prescriptions on File Prior to Visit  Medication Sig Dispense Refill  . albuterol (PROVENTIL HFA;VENTOLIN HFA) 108 (90 Base) MCG/ACT inhaler Inhale two puffs every four to six hours as needed for cough or wheeze. 1 Inhaler 1  . amLODipine (NORVASC) 5 MG tablet Take 5 mg by mouth daily.    . Aspirin-Acetaminophen-Caffeine (EXCEDRIN MIGRAINE PO) Take 2 tablets by mouth as needed.    . Biotin 1 MG CAPS Take 1 % by mouth daily.    . cetirizine (ZYRTEC) 10 MG tablet Take 10  mg by mouth daily.    . Dulaglutide (TRULICITY) 1.5 WU/9.8JX SOPN Inject into the skin.    Marland Kitchen EPINEPHrine (EPIPEN 2-PAK) 0.3 mg/0.3 mL IJ SOAJ injection Use as directed for life-threatening allergic reaction. 2 Device 1  . escitalopram (LEXAPRO) 20 MG tablet Take 20 mg by mouth daily.    Marland Kitchen FERROUS SULFATE PO Take 142 mcg by mouth daily.     Marland Kitchen glyBURIDE-metformin (GLUCOVANCE) 2.5-500 MG per tablet Take 1 tablet by mouth 2 (two) times daily with a meal.    . hydrALAZINE (APRESOLINE) 25 MG tablet Take 25 mg by mouth 2 (two) times daily.    Marland Kitchen ibuprofen (ADVIL,MOTRIN) 200 MG tablet Take 200 mg by mouth every 6 (six) hours as needed.    . Insulin Glargine (BASAGLAR KWIKPEN) 100 UNIT/ML SOPN Inject 30-50 Units into the skin daily.     Marland Kitchen LORazepam (ATIVAN) 0.5 MG tablet Take 0.5 mg by mouth as needed for anxiety.    . metaxalone (SKELAXIN) 800 MG tablet Take 800 mg by mouth 3 (three) times daily as needed for muscle spasms.    . Multiple Vitamins-Minerals (CENTRUM SILVER PO) Take 1 tablet by mouth daily.    Marland Kitchen torsemide (DEMADEX) 20 MG tablet Take 20 mg by mouth as needed.    . TRAVATAN Z 0.004 % SOLN ophthalmic solution Place 0.004 drops into both eyes daily.    . vitamin B-12 (CYANOCOBALAMIN) 100 MCG tablet Take 100 mcg by mouth 3 (three) times a week.     No current facility-administered medications on file prior to visit.     LABS/IMAGING: No results found for this or any previous visit (from the past 48 hour(s)). No results found.  LIPID PANEL: No results found for: CHOL, TRIG, HDL, CHOLHDL, VLDL, LDLCALC, LDLDIRECT  WEIGHTS: Wt Readings from Last 3 Encounters:  03/14/17 227 lb 9.6 oz (103.2 kg)  03/07/17 222 lb (100.7 kg)  01/10/17 225 lb (102.1 kg)    VITALS: BP 126/62 (BP Location: Right Arm, Patient Position: Sitting, Cuff Size: Large)   Pulse 83   Ht 5' 4.5" (1.638 m)   Wt 227 lb 9.6 oz (103.2 kg)   BMI 38.46 kg/m   EXAM: General appearance: alert, no distress and moderately  obese Neck: no carotid bruit and no JVD Lungs: clear to auscultation bilaterally Heart: regular rate and rhythm, S1, S2 normal and friction rub heard At the apex Abdomen: soft, non-tender; bowel sounds normal; no masses,  no organomegaly Extremities: extremities normal, atraumatic, no cyanosis or edema Pulses: 2+ and symmetric Skin: Skin color, texture, turgor normal. No rashes or lesions Neurologic: Grossly normal Psych: Flat affect  EKG: Normal sinus rhythm at 83 - personally reviewed  ASSESSMENT: 1. Pericardial effusion 2. History of false positive Myoview in 2012 with normal coronaries by catheterization 3. 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   Mrs. Gulotta was found to have an incidental pericardial effusion by echocardiography  at the [facility] in April 2018. She reports recently some intermittent chest pressure which could be related to this and does have a friction rub on exam. I like to repeat a limited echo to see if there's been interval change in the size of her pericardial effusion. She does not seem to have any clinical features of cardiac tamponade. The etiology of her pericardial effusion is unclear. She denies any recent significant viral illness. There is a question of possible underlying autoimmune disease evident multiple medical problems without a clear unifying diagnosis. I would agree with continued rheumatologic workup including sedimentation rate and testing for lupus and/or sarcoidosis. She tells me that she has an upcoming appointment with a rheumatologist at the end of the month. Other possibilities include infection, malignancy or most commonly idiopathic causes of pericardial inflammation. I will follow-up with her after we get results back from her echocardiogram and will follow her rheumatologic testing closely. At this time I don't see a clear indication for anti-inflammatory therapy.  Thanks for the consultation. Plan  follow-up to review her echo in a few weeks.  Pixie Casino, MD, [place]  Attending Cardiologist  Direct Dial: [phone]  Fax: [phone]  Website:  [email or web address] 03/14/2017, 11:34 AM

## 2017-03-17 ENCOUNTER — Ambulatory Visit (INDEPENDENT_AMBULATORY_CARE_PROVIDER_SITE_OTHER): Payer: BC Managed Care – PPO | Admitting: Neurology

## 2017-03-17 DIAGNOSIS — G4733 Obstructive sleep apnea (adult) (pediatric): Secondary | ICD-10-CM | POA: Diagnosis not present

## 2017-03-17 DIAGNOSIS — G4720 Circadian rhythm sleep disorder, unspecified type: Secondary | ICD-10-CM

## 2017-03-19 DIAGNOSIS — D3500 Benign neoplasm of unspecified adrenal gland: Secondary | ICD-10-CM | POA: Insufficient documentation

## 2017-03-19 DIAGNOSIS — G629 Polyneuropathy, unspecified: Secondary | ICD-10-CM | POA: Insufficient documentation

## 2017-03-19 DIAGNOSIS — M4800 Spinal stenosis, site unspecified: Secondary | ICD-10-CM | POA: Insufficient documentation

## 2017-03-22 NOTE — Progress Notes (Signed)
Patient referred by Dr. Wilson Singer, seen by me on 03/07/17, diagnostic PSG on 03/17/17.    Please call and notify the patient that the recent sleep study did confirm the diagnosis of mod to severe obstructive sleep apnea and that I recommend treatment for this in the form of CPAP. This will require a repeat sleep study for proper titration and mask fitting. Please explain to patient and arrange for a CPAP titration study. I have placed an order in the chart. Thanks, and please route to [facility] for scheduling next sleep study.  [name], MD, PhD [facility])

## 2017-03-22 NOTE — Addendum Note (Signed)
Addended by: [name] on: 03/22/2017 04:14 PM   Modules accepted: Orders

## 2017-03-22 NOTE — Procedures (Signed)
PATIENT'S NAME:  Jocelyn Kaufman, Jocelyn Kaufman DOB:      [birth year]      MR#:    [id number]     DATE OF RECORDING: 03/17/2017 REFERRING M.D.:  Anda Kraft, MD Study Performed:   Baseline Polysomnogram HISTORY: 61 year old woman with a history of Cushing's disease, depression, diabetes with suboptimal control, hyperlipidemia, hypertension, migraines, meralgia paresthetica on the right side, and paresthesias, and obesity, who reports snoring and excessive daytime somnolence. The patient endorsed the Epworth Sleepiness Scale at 9 points. The patient's weight 222 pounds with a height of 64 (inches), resulting in a BMI of 37.6 kg/m2. The patient's neck circumference measured 16.6 inches.  CURRENT MEDICATIONS: Albuterol inhaler, Norvasc, Excedrin Migraine, Biotin, Zyrtec, Trulicity, Epipen, Lexapro, Ferrous Sulfate, Glucovance, Apresoline, Advil, Insulin Glargine, Levothyroxine, Ativan, Skelaxin, Centrum Silver, Demadex, Travatan eye drops, Vit B-12,   PROCEDURE:  This is a multichannel digital polysomnogram utilizing the Somnostar 11.2 system.  Electrodes and sensors were applied and monitored per AASM Specifications.   EEG, EOG, Chin and Limb EMG, were sampled at 200 Hz.  ECG, Snore and Nasal Pressure, Thermal Airflow, Respiratory Effort, CPAP Flow and Pressure, Oximetry was sampled at 50 Hz. Digital video and audio were recorded.      BASELINE STUDY  Lights Out was at 21:01 and Lights On at 04:30.  Total recording time (TRT) was 449 minutes, with a total sleep time (TST) of  350 minutes.   The patient's sleep latency was 58 minutes, which is delayed.  REM latency was 115 minutes, which is high normal. The sleep efficiency was 78. %.     SLEEP ARCHITECTURE: WASO (Wake after sleep onset) was 52.5 minutes with moderate sleep fragmentation noted.  There were 39 minutes in Stage N1, 175 minutes Stage N2, 36 minutes Stage N3 and 100 minutes in Stage REM.  The percentage of Stage N1 was 11.1%, which is increased, Stage N2 was  50%, which is normal, Stage N3 was 10.3% and Stage R (REM sleep) was 28.6%, which is increased.  The arousals were noted as: 21 were spontaneous, 0 were associated with PLMs, 12 were associated with respiratory events.    Audio and video analysis did not show any abnormal or unusual movements, behaviors, phonations or vocalizations. The patient took 1 bathroom break. Mild to moderate and rare loud snoring was noted. The EKG was in keeping with normal sinus rhythm (NSR).  RESPIRATORY ANALYSIS:  There were a total of 88 respiratory events:  34 obstructive apneas, 0 central apneas and 0 mixed apneas with a total of 34 apneas and an apnea index (AI) of 5.8 /hour. There were 54 hypopneas with a hypopnea index of 9.3 /hour. The patient also had 0 respiratory event related arousals (RERAs).      The total APNEA/HYPOPNEA INDEX (AHI) was 15.1/hour and the total RESPIRATORY DISTURBANCE INDEX was 15.1 /hour.  58 events occurred in REM sleep and 56 events in NREM. The REM AHI was 34.8 /hour, versus a non-REM AHI of 7.2. The patient spent 138 minutes of total sleep time in the supine position and 212 minutes in non-supine.. The supine AHI was 27.4 versus a non-supine AHI of 7.1.  OXYGEN SATURATION & C02:  The Wake baseline 02 saturation was 98%, with the lowest being 85%. Time spent below 89% saturation equaled 9 minutes.  PERIODIC LIMB MOVEMENTS:  The patient had a total of 10 Periodic Limb Movements.  The Periodic Limb Movement (PLM) index was 1.7 and the PLM Arousal index was 0/hour. Post-study, the  patient indicated that sleep was the same as usual.   IMPRESSION:  1. Obstructive Sleep Apnea (OSA) 2. Dysfunctions associated with sleep stages or arousal from sleep  RECOMMENDATIONS:  1. This study demonstrates moderate to severe obstructive sleep apnea, with a total AHI of 15.1/hour, REM AHI of 34.8/hour, supine AHI of 27.4/hour and O2 nadir of 85%. Treatment with positive airway pressure in the form of  CPAP is recommended. This will require a full night titration study to optimize therapy. Other treatment options may include avoidance of supine sleep position along with weight loss, upper airway or jaw surgery in selected patients or the use of an oral appliance in certain patients. ENT evaluation and/or consultation with a maxillofacial surgeon or dentist may be feasible in some instances.    2. Please note that untreated obstructive sleep apnea carries additional perioperative morbidity. Patients with significant obstructive sleep apnea should receive perioperative PAP therapy and the surgeons and particularly the anesthesiologist should be informed of the diagnosis and the severity of the sleep disordered breathing. 3. This study shows sleep fragmentation and abnormal sleep stage percentages; these are nonspecific findings and per se do not signify an intrinsic sleep disorder or a cause for the patient's sleep-related symptoms. Causes include (but are not limited to) the first night effect of the sleep study, circadian rhythm disturbances, medication effect or an underlying mood disorder or medical problem.  4. The patient should be cautioned not to drive, work at heights, or operate dangerous or heavy equipment when tired or sleepy. Review and reiteration of good sleep hygiene measures should be pursued with any patient. 5. The patient will be seen in follow-up by Dr. [name] at [facility] for discussion of the test results and further management strategies. The referring provider will be notified of the test results.  I certify that I have reviewed the entire raw data recording prior to the issuance of this report in accordance with the Standards of Accreditation of the American Academy of Sleep Medicine (AASM)   [name], MD, PhD Diplomat, American Board of Psychiatry and Neurology (Neurology and Sleep Medicine)

## 2017-03-26 ENCOUNTER — Telehealth: Payer: Self-pay

## 2017-03-26 NOTE — Telephone Encounter (Signed)
-----   Message from [name], MD sent at 03/22/2017  4:14 PM EDT ----- Patient referred by Dr. Wilson Singer, seen by me on 03/07/17, diagnostic PSG on 03/17/17.    Please call and notify the patient that the recent sleep study did confirm the diagnosis of mod to severe obstructive sleep apnea and that I recommend treatment for this in the form of CPAP. This will require a repeat sleep study for proper titration and mask fitting. Please explain to patient and arrange for a CPAP titration study. I have placed an order in the chart. Thanks, and please route to [facility] for scheduling next sleep study.  [name], MD, PhD [facility])

## 2017-03-26 NOTE — Telephone Encounter (Signed)
I spoke to patient and she is aware of results and recommendations. She is willing to proceed with titration study. I will send report to PCP.

## 2017-03-27 ENCOUNTER — Ambulatory Visit (INDEPENDENT_AMBULATORY_CARE_PROVIDER_SITE_OTHER): Payer: BC Managed Care – PPO

## 2017-03-27 DIAGNOSIS — J309 Allergic rhinitis, unspecified: Secondary | ICD-10-CM | POA: Diagnosis not present

## 2017-03-28 ENCOUNTER — Other Ambulatory Visit: Payer: Self-pay

## 2017-03-28 ENCOUNTER — Ambulatory Visit (HOSPITAL_COMMUNITY): Payer: BC Managed Care – PPO | Attending: Internal Medicine

## 2017-03-28 DIAGNOSIS — I119 Hypertensive heart disease without heart failure: Secondary | ICD-10-CM | POA: Diagnosis not present

## 2017-03-28 DIAGNOSIS — E785 Hyperlipidemia, unspecified: Secondary | ICD-10-CM | POA: Insufficient documentation

## 2017-03-28 DIAGNOSIS — I3139 Other pericardial effusion (noninflammatory): Secondary | ICD-10-CM

## 2017-03-28 DIAGNOSIS — I313 Pericardial effusion (noninflammatory): Secondary | ICD-10-CM | POA: Insufficient documentation

## 2017-03-28 DIAGNOSIS — E119 Type 2 diabetes mellitus without complications: Secondary | ICD-10-CM | POA: Insufficient documentation

## 2017-03-29 ENCOUNTER — Telehealth: Payer: Self-pay | Admitting: Internal Medicine

## 2017-03-29 DIAGNOSIS — I3139 Other pericardial effusion (noninflammatory): Secondary | ICD-10-CM

## 2017-03-29 DIAGNOSIS — I313 Pericardial effusion (noninflammatory): Secondary | ICD-10-CM

## 2017-03-29 NOTE — Telephone Encounter (Signed)
Follow Up ° °Pt returning call from earlier. Please call. °

## 2017-04-01 ENCOUNTER — Other Ambulatory Visit: Payer: Self-pay | Admitting: Rheumatology

## 2017-04-01 ENCOUNTER — Encounter: Payer: Self-pay | Admitting: Internal Medicine

## 2017-04-01 DIAGNOSIS — C801 Malignant (primary) neoplasm, unspecified: Secondary | ICD-10-CM

## 2017-04-01 NOTE — Telephone Encounter (Signed)
Had attempted to contact patient regarding echo results. She returned call. Patient aware. Limited echo ordered. Message to scheduling pool to contact patient to arrange limited echo & r/s follow up

## 2017-04-02 ENCOUNTER — Ambulatory Visit (INDEPENDENT_AMBULATORY_CARE_PROVIDER_SITE_OTHER): Payer: BC Managed Care – PPO | Admitting: Neurology

## 2017-04-02 DIAGNOSIS — G4733 Obstructive sleep apnea (adult) (pediatric): Secondary | ICD-10-CM

## 2017-04-02 DIAGNOSIS — G4720 Circadian rhythm sleep disorder, unspecified type: Secondary | ICD-10-CM

## 2017-04-05 NOTE — Addendum Note (Signed)
Addended by: [name] on: 04/05/2017 10:17 AM   Modules accepted: Orders

## 2017-04-05 NOTE — Progress Notes (Signed)
Patient referred by Dr. Wilson Singer, seen by me on 03/07/17, diagnostic PSG on 03/17/17, CPAP study on 04/02/17.  Please call and inform patient that I have entered an order for treatment with positive airway pressure (PAP) treatment of obstructive sleep apnea (OSA). She did well during the latest sleep study with CPAP. We will, therefore, arrange for a machine for home use through a DME (durable medical equipment) company of Her choice; and I will see the patient back in follow-up in about 10 weeks or with nurse pract if needed. Please also explain to the patient that I will be looking out for compliance data, which can be downloaded from the machine (stored on an SD card, that is inserted in the machine) or via remote access through a modem, that is built into the machine. At the time of the followup appointment we will discuss sleep study results and how it is going with PAP treatment at home. Please advise patient to bring Her machine at the time of the first FU visit, even though this is cumbersome. Bringing the machine for every visit after that will likely not be needed, but often helps for the first visit to troubleshoot if needed. Please re-enforce the importance of compliance with treatment and the need for [place] to monitor compliance data - often an insurance requirement and actually good feedback for the patient as far as how they are doing.  Also remind patient, that any interim PAP machine or mask issues should be first addressed with the DME company, as they can often help better with technical and mask fit issues. Please ask if patient has a preference regarding DME company.  Please also make sure, the patient has a follow-up appointment with me or one of our nurse practioners  in about 10 weeks from the setup date, thanks.  Once you have spoken to the patient - and faxed/routed report to PCP and referring MD (if other than PCP), you can close this encounter, thanks,   [name], MD, PhD [facility] ([place])

## 2017-04-05 NOTE — Procedures (Signed)
PATIENT'S NAME:  Jocelyn Kaufman, Jocelyn Kaufman DOB:      [birth year]      MR#:    [id number]     DATE OF RECORDING: 04/02/2017 REFERRING M.D.:  Anda Kraft, MD Study Performed:   CPAP  Titration HISTORY:  61 year old woman with a history of Cushing's disease, depression, diabetes with suboptimal control, hyperlipidemia, hypertension, migraines, meralgia paresthetica on the right side, and paresthesias, and obesity, who returns for CPAP titration. She had a baseline sleep study on 03/17/17 with AHI of 15.1/hour, REM AHI of 34.8 /hour, supine AHI of 27.4/hour, O2 nadir of 85%. BMI of 37.6 kg/m2  CURRENT MEDICATIONS: Albuterol inhaler, Norvasc, Excedrin Migraine, Biotin, Zyrtec, Trulicity, Epipen, Lexapro, Ferrous Sulfate, Glucovance, Apresoline, Advil, Insulin Glargine, Levothyroxine, Ativan, Skelaxin, Centrum Silver, Demadex, Travatan eye drops, Vit B-12,   PROCEDURE:  This is a multichannel digital polysomnogram utilizing the SomnoStar 11.2 system.  Electrodes and sensors were applied and monitored per AASM Specifications.   EEG, EOG, Chin and Limb EMG, were sampled at 200 Hz.  ECG, Snore and Nasal Pressure, Thermal Airflow, Respiratory Effort, CPAP Flow and Pressure, Oximetry was sampled at 50 Hz. Digital video and audio were recorded.      The patient was fitted with medium nasal pillows. CPAP was initiated at 5 cmH20 with heated humidity per AASM standards and pressure was advanced to 7 cmH20 because of hypopneas, apneas and desaturations.  At a PAP pressure of 7 cmH20, there was a reduction of the AHI to 0 with O2 nadir of 90%, supine NREM sleep achieved.   Lights Out was at 20:59 and Lights On at 05:06. Total recording time (TRT) was 486.5 minutes, with a total sleep time (TST) of 384 minutes. The patient's sleep latency was 17.5 minutes, REM latency was 202 minutes, which is delayed. The sleep efficiency was 78.9 %.    SLEEP ARCHITECTURE: WASO (Wake after sleep onset)  was 85 minutes with moderate sleep  fragmentation noted.  There were 15 minutes in Stage N1, 254.5 minutes Stage N2, 53 minutes Stage N3 and 61.5 minutes in Stage REM.  The percentage of Stage N1 was 3.9%, Stage N2 was 66.3%, which is increased, Stage N3 was 13.8%, and Stage R (REM sleep) was 16.%, which is mildly reduced.  The arousals were noted as: 92 were spontaneous, 0 were associated with PLMs, 1 were associated with respiratory events. Audio and video analysis did not show any abnormal or unusual movements, behaviors, phonations or vocalizations.  The patient took 2 bathroom breaks. The EKG was in keeping with normal sinus rhythm (NSR).  RESPIRATORY ANALYSIS:  There was a total of 3 respiratory events: 0 obstructive apneas, 3 central apneas and 0 mixed apneas with a total of 3 apneas and an apnea index (AI) of .5 /hour. There were 0 hypopneas with a hypopnea index of 0/hour. The patient also had 0 respiratory event related arousals (RERAs).      The total APNEA/HYPOPNEA INDEX  (AHI) was .5 /hour and the total RESPIRATORY DISTURBANCE INDEX was .5 .hour  2 events occurred in REM sleep and 1 events in NREM. The REM AHI was 2. /hour versus a non-REM AHI of .2 /hour.  The patient spent 126.5 minutes of total sleep time in the supine position and 258 minutes in non-supine. The supine AHI was 0.0, versus a non-supine AHI of 0.7.  OXYGEN SATURATION & C02:  The baseline 02 saturation was 98%, with the lowest being 90%. Time spent below 89% saturation equaled 0 minutes.  PERIODIC LIMB MOVEMENTS:  The patient had a total of 0 Periodic Limb Movements. The Periodic Limb Movement (PLM) index was 0 and the PLM Arousal index was 0 /hour. Post-study, the patient indicated that sleep was better than usual.   IMPRESSION: 1. Obstructive Sleep Apnea (OSA) 2. Dysfunctions associated with sleep stages or arousal from sleep   RECOMMENDATIONS: 1.This study demonstrates resolution of the patient's obstructive sleep apnea with CPAP therapy. I will,  therefore, start the patient on home CPAP treatment at a pressure of 7 cm via medium nasal pillows with heated humidity. The patient should be reminded to be fully compliant with PAP therapy to improve sleep related symptoms and decrease long term cardiovascular risks. The patient should be reminded, that it may take up to 3 months to get fully used to using PAP with all planned sleep. The earlier full compliance is achieved, the better long term compliance tends to be. Please note that untreated obstructive sleep apnea carries additional perioperative morbidity. Patients with significant obstructive sleep apnea should receive perioperative PAP therapy and the surgeons and particularly the anesthesiologist should be informed of the diagnosis and the severity of the sleep disordered breathing. Other treatment options for OSA may include avoidance of supine sleep position along with weight loss, upper airway or jaw surgery in selected patients or the use of an oral appliance in certain patients. ENT evaluation and/or consultation with a maxillofacial surgeon or dentist may be feasible in some instances.    2.This study shows sleep fragmentation and abnormal sleep stage percentages; these are nonspecific findings and per se do not signify an intrinsic sleep disorder or a cause for the patient's sleep-related symptoms. Causes include (but are not limited to) the first night effect of the sleep study, circadian rhythm disturbances, medication effect or an underlying mood disorder or medical problem.  3.The patient should be cautioned not to drive, work at heights, or operate dangerous or heavy equipment when tired or sleepy. Review and reiteration of good sleep hygiene measures should be pursued with any patient. 4.The patient will be seen in follow-up by Dr. [name] at [facility] for discussion of the test results and further management strategies. The referring provider will be notified of the test results.   I certify  that I have reviewed the entire raw data recording prior to the issuance of this report in accordance with the Standards of Accreditation of the American Academy of Sleep Medicine (AASM)     [name], MD, PhD Diplomat, American Board of Psychiatry and Neurology (Neurology and Sleep Medicine)

## 2017-04-11 ENCOUNTER — Telehealth: Payer: Self-pay

## 2017-04-11 NOTE — Telephone Encounter (Signed)
-----   Message from [name], MD sent at 04/05/2017 10:17 AM EDT ----- Patient referred by Dr. Wilson Singer, seen by me on 03/07/17, diagnostic PSG on 03/17/17, CPAP study on 04/02/17.  Please call and inform patient that I have entered an order for treatment with positive airway pressure (PAP) treatment of obstructive sleep apnea (OSA). She did well during the latest sleep study with CPAP. We will, therefore, arrange for a machine for home use through a DME (durable medical equipment) company of Her choice; and I will see the patient back in follow-up in about 10 weeks or with nurse pract if needed. Please also explain to the patient that I will be looking out for compliance data, which can be downloaded from the machine (stored on an SD card, that is inserted in the machine) or via remote access through a modem, that is built into the machine. At the time of the followup appointment we will discuss sleep study results and how it is going with PAP treatment at home. Please advise patient to bring Her machine at the time of the first FU visit, even though this is cumbersome. Bringing the machine for every visit after that will likely not be needed, but often helps for the first visit to troubleshoot if needed. Please re-enforce the importance of compliance with treatment and the need for [place] to monitor compliance data - often an insurance requirement and actually good feedback for the patient as far as how they are doing.  Also remind patient, that any interim PAP machine or mask issues should be first addressed with the DME company, as they can often help better with technical and mask fit issues. Please ask if patient has a preference regarding DME company.  Please also make sure, the patient has a follow-up appointment with me or one of our nurse practioners  in about 10 weeks from the setup date, thanks.  Once you have spoken to the patient - and faxed/routed report to PCP and referring MD (if other than PCP), you  can close this encounter, thanks,   [name], MD, PhD [facility] ([place])

## 2017-04-11 NOTE — Telephone Encounter (Signed)
I called pt. I advised pt that Dr. [name] reviewed their sleep study results and found that pt did well with the cpap. Dr. [name] recommends that pt start a cpap at home. I reviewed PAP compliance expectations with the pt. Pt is agreeable to starting a CPAP. I advised pt that an order will be sent to a DME, [facility], and [employer] will call the pt within about one week after they file with the pt's insurance. [employer] will show the pt how to use the machine, fit for masks, and troubleshoot the CPAP if needed. A follow up appt was made for insurance purposes with Dr. [name] on Monday, August 27th, 2018 at 10:30am. Pt verbalized understanding to arrive 15 minutes early and bring their CPAP. A letter with all of this information in it will be mailed to the pt as a reminder. I verified with the pt that the address we have on file is correct. Pt asked me to give her [employer]'s address and phone number, which I provided to her.  Pt verbalized understanding of results. Pt had no questions at this time but was encouraged to call back if questions arise.

## 2017-04-13 ENCOUNTER — Ambulatory Visit
Admission: RE | Admit: 2017-04-13 | Discharge: 2017-04-13 | Disposition: A | Discharge status: Home / Self Care | Payer: BC Managed Care – PPO | Source: Ambulatory Visit | Attending: Rheumatology | Admitting: Rheumatology

## 2017-04-13 DIAGNOSIS — C801 Malignant (primary) neoplasm, unspecified: Secondary | ICD-10-CM

## 2017-04-13 MED ORDER — GADOBENATE DIMEGLUMINE 529 MG/ML IV SOLN
20.0000 mL | Freq: Once | INTRAVENOUS | Status: AC | PRN
  Administered 2017-04-13: 20 mL via INTRAVENOUS

## 2017-04-16 NOTE — Progress Notes (Signed)
VIALS EXP 04-19-18  HC/JM

## 2017-04-19 ENCOUNTER — Ambulatory Visit: Payer: BC Managed Care – PPO | Admitting: Internal Medicine

## 2017-04-19 DIAGNOSIS — J301 Allergic rhinitis due to pollen: Secondary | ICD-10-CM | POA: Diagnosis not present

## 2017-04-26 ENCOUNTER — Ambulatory Visit (INDEPENDENT_AMBULATORY_CARE_PROVIDER_SITE_OTHER): Payer: BC Managed Care – PPO

## 2017-04-26 DIAGNOSIS — J309 Allergic rhinitis, unspecified: Secondary | ICD-10-CM | POA: Diagnosis not present

## 2017-04-29 ENCOUNTER — Ambulatory Visit (HOSPITAL_COMMUNITY): Payer: BC Managed Care – PPO | Attending: Internal Medicine

## 2017-04-29 ENCOUNTER — Ambulatory Visit (HOSPITAL_BASED_OUTPATIENT_CLINIC_OR_DEPARTMENT_OTHER): Payer: BC Managed Care – PPO | Admitting: Hematology and Oncology

## 2017-04-29 ENCOUNTER — Ambulatory Visit: Payer: BC Managed Care – PPO

## 2017-04-29 ENCOUNTER — Other Ambulatory Visit: Payer: Self-pay

## 2017-04-29 ENCOUNTER — Encounter: Payer: Self-pay | Admitting: Hematology and Oncology

## 2017-04-29 VITALS — BP 139/55 | HR 95 | Temp 98.2°F | Resp 18 | Ht 64.5 in | Wt 229.9 lb

## 2017-04-29 DIAGNOSIS — M899 Disorder of bone, unspecified: Secondary | ICD-10-CM | POA: Diagnosis not present

## 2017-04-29 DIAGNOSIS — I519 Heart disease, unspecified: Secondary | ICD-10-CM | POA: Insufficient documentation

## 2017-04-29 DIAGNOSIS — I420 Dilated cardiomyopathy: Secondary | ICD-10-CM | POA: Diagnosis not present

## 2017-04-29 DIAGNOSIS — I3139 Other pericardial effusion (noninflammatory): Secondary | ICD-10-CM

## 2017-04-29 DIAGNOSIS — R937 Abnormal findings on diagnostic imaging of other parts of musculoskeletal system: Secondary | ICD-10-CM | POA: Diagnosis not present

## 2017-04-29 DIAGNOSIS — I313 Pericardial effusion (noninflammatory): Secondary | ICD-10-CM | POA: Diagnosis not present

## 2017-04-29 NOTE — Progress Notes (Signed)
Per Dr.Gudena, request PET scan CT from [facility] done on 01/2017. Called [facility] medical records [phone] requested for their fax# [phone]) to send consent form, signed by pt. Requesting for CD to be sent to Dr.Gudena as well. Was told that it can take 10-14 business days to get CD in the mail. Received confirmation that consent form was faxed to number provided.  Called Dr.Norris office per Dr.Gudena request. Md out of office until Thursday. Left msg with nurse with Dr.Gudena call back number for when MD available to call. Dr.Gudena is aware.

## 2017-04-29 NOTE — Assessment & Plan Note (Signed)
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  Review: I discussed the findings of the MRI of the humerus. This is concerning for malignancy. Differential diagnosis could include plasmacytoma, metastatic tumor, sarcoma etc.  Recommendation:  1. Blood test for serum protein electrophoresis and immunofixation to rule out plasmacytoma 2. await recommendations from Dr. Levin Bacon orthopedics. Patient will need a biopsy of this lesion.  Return to clinic after biopsy to discuss the pathology report.

## 2017-04-29 NOTE — Progress Notes (Signed)
[facility] NOTE  Patient Care Team: Anda Kraft, MD as PCP - General (Endocrinology)  CHIEF COMPLAINTS/PURPOSE OF CONSULTATION:  Newly diagnosed left humeral head lesion  HISTORY OF PRESENTING ILLNESS:  Jocelyn Kaufman 61 y.o. female is here because of recent diagnosis of left head of the humerus lesion. 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 She was noted to have bilateral adrenal nodules. Because of this they performed a PET/CT scan in April 2018. Radiology report on the PET scan showed lesion of the left humerus that was hypermetabolic with an SUV score of 4.9. She was referred back to her primary care physician or obtain an MRI of the left shoulder. This was performed on 04/11/2017 and it showed a 3.5 cm diameter 5 similar lesion in the left humerus neck. This was concerning for malignancy. The cause of this she was referred to medical oncology for assistance. Patient has appointment to see orthopedic surgeon Dr. Veverly Fells, who she had seen in the past for right shoulder rotator cuff pain. 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 She had right shoulder pain and is receiving treatments to [facility]. I reviewed her records extensively and collaborated the history with the patient.  MEDICAL HISTORY:  Past Medical History:  Diagnosis Date  . Adrenal adenoma   . 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 Depression   . Diabetes mellitus without complication ([place])   . Hyperlipidemia   . Hypertension   . Hypothyroidism   . Meralgia paresthetica of right side 01/10/2017  . Migraine   . Pericardial effusion   . Ulnar neuropathy at wrist, 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    SURGICAL HISTORY: Past Surgical History:  Procedure Laterality Date  . APPENDECTOMY    . CYST REMOVAL LEG Right    Removed from right thigh joint  . TONSILLECTOMY    . TOTAL VAGINAL HYSTERECTOMY    . UMBILICAL HERNIA REPAIR      SOCIAL HISTORY: Social History    Social History  . Marital status: Single    Spouse name: N/A  . Number of children: 0  . Years of education: Masters   Occupational History  . [employer]    Social History Main Topics  . Smoking status: Never Smoker  . Smokeless tobacco: Never Used  . Alcohol use Yes     Comment: occ  . Drug use: No  . Sexual activity: No   Other Topics Concern  . Not on file   Social History Narrative   Lives alone   Caffeine use: 2 cups coffee per day   Tea sometimes   Soda- 3 x/week   Right-handed    FAMILY HISTORY: Family History  Problem Relation Age of Onset  . Hypertension Mother   . Stroke Mother   . Kidney failure Mother   . Heart disease Mother        heart murmur  . Glaucoma Mother   . Stroke Father   . COPD Father   . Cancer - Prostate Father   . Colon cancer Father     ALLERGIES:  is allergic to invokana [canagliflozin]; percocet [oxycodone-acetaminophen]; prilosec [omeprazole]; rogaine [minoxidil]; and victoza [liraglutide].  MEDICATIONS:  Current Outpatient Prescriptions  Medication Sig Dispense Refill  . albuterol (PROVENTIL HFA;VENTOLIN HFA) 108 (90 Base) MCG/ACT inhaler Inhale two puffs every four to six hours as needed for cough or wheeze. 1 Inhaler 1  . amLODipine (NORVASC) 5 MG tablet Take 5 mg by mouth daily.    Marland Kitchen  Aspirin-Acetaminophen-Caffeine (EXCEDRIN MIGRAINE PO) Take 2 tablets by mouth as needed.    . Biotin 1 MG CAPS Take 1 % by mouth daily.    . cetirizine (ZYRTEC) 10 MG tablet Take 10 mg by mouth daily.    . Dulaglutide (TRULICITY) 1.5 GG/2.6RS SOPN Inject into the skin.    Marland Kitchen EPINEPHrine (EPIPEN 2-PAK) 0.3 mg/0.3 mL IJ SOAJ injection Use as directed for life-threatening allergic reaction. 2 Device 1  . escitalopram (LEXAPRO) 20 MG tablet Take 20 mg by mouth daily.    Marland Kitchen FERROUS SULFATE PO Take 142 mcg by mouth daily.     Marland Kitchen glyBURIDE-metformin (GLUCOVANCE) 2.5-500 MG per tablet Take 1 tablet by mouth 2 (two) times daily with a meal.     . hydrALAZINE (APRESOLINE) 25 MG tablet Take 25 mg by mouth 2 (two) times daily.    Marland Kitchen ibuprofen (ADVIL,MOTRIN) 200 MG tablet Take 200 mg by mouth every 6 (six) hours as needed.    . Insulin Glargine (BASAGLAR KWIKPEN) 100 UNIT/ML SOPN Inject 30-50 Units into the skin daily.     Marland Kitchen levothyroxine (SYNTHROID) 125 MCG tablet Take 125 mcg by mouth daily before breakfast. BRAND ONLY    . LORazepam (ATIVAN) 0.5 MG tablet Take 0.5 mg by mouth as needed for anxiety.    . metaxalone (SKELAXIN) 800 MG tablet Take 800 mg by mouth 3 (three) times daily as needed for muscle spasms.    . Multiple Vitamins-Minerals (CENTRUM SILVER PO) Take 1 tablet by mouth daily.    Marland Kitchen torsemide (DEMADEX) 20 MG tablet Take 20 mg by mouth as needed.    . TRAVATAN Z 0.004 % SOLN ophthalmic solution Place 0.004 drops into both eyes daily.    . vitamin B-12 (CYANOCOBALAMIN) 100 MCG tablet Take 100 mcg by mouth 3 (three) times a week.     No current facility-administered medications for this visit.     REVIEW OF SYSTEMS:   Constitutional: Denies fevers, chills or abnormal night sweats Eyes: Denies blurriness of vision, double vision or watery eyes Ears, nose, mouth, throat, and face: Denies mucositis or sore throat Respiratory: Denies cough, dyspnea or wheezes Cardiovascular: Denies palpitation, chest discomfort or lower extremity swelling Gastrointestinal:  Denies nausea, heartburn or change in bowel habits Skin: Denies abnormal skin rashes Lymphatics: Denies new lymphadenopathy or easy bruising Neurological:Denies numbness, tingling or new weaknesses Behavioral/Psych: Mood is stable, no new changes  Extremities: Patient does not have limitation of range of motion of the left shoulder.  All other systems were reviewed with the patient and are negative.  PHYSICAL EXAMINATION: ECOG PERFORMANCE STATUS: 1 - Symptomatic but completely ambulatory  Vitals:   04/29/17 1224  BP: (!) 139/55  Pulse: 95  Resp: 18  Temp: 98.2  F (36.8 C)   Filed Weights   04/29/17 1224  Weight: 229 lb 14.4 oz (104.3 kg)    GENERAL:alert, no distress and comfortable SKIN: skin color, texture, turgor are normal, no rashes or significant lesions EYES: normal, conjunctiva are pink and non-injected, sclera clear OROPHARYNX:no exudate, no erythema and lips, buccal mucosa, and tongue normal  NECK: supple, thyroid normal size, non-tender, without nodularity LYMPH:  no palpable lymphadenopathy in the cervical, axillary or inguinal LUNGS: clear to auscultation and percussion with normal breathing effort HEART: regular rate & rhythm and no murmurs and no lower extremity edema ABDOMEN:abdomen soft, non-tender and normal bowel sounds Musculoskeletal:no cyanosis of digits and no clubbing  PSYCH: alert & oriented x 3 with fluent speech NEURO: no focal motor/sensory  deficits  RADIOGRAPHIC STUDIES: I have personally reviewed the radiological reports and agreed with the findings in the report.  ASSESSMENT AND PLAN:  Lesion of left humerus PET/CT scan 02/18/2017: Abnormal PET positive uptake in the left humeral head SUV 4.9, bilateral atrial hyperplasia MRI left humerus 04/13/2017: 3.5 a 3.1 cm metaphyseal lesion in 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  Review: I discussed the findings of the MRI of the humerus. This is concerning for malignancy. Differential diagnosis could include plasmacytoma, metastatic tumor, sarcoma etc.  Recommendation:  1. Blood test for serum protein electrophoresis and immunofixation to rule out plasmacytoma 2. await recommendations from Dr. Levin Bacon orthopedics. Patient will need a biopsy of this lesion.  Return to clinic after biopsy to discuss the pathology report.    All questions were answered. The patient knows to call the clinic with any problems, questions or concerns.    Rulon Eisenmenger, MD 04/29/17

## 2017-04-30 ENCOUNTER — Encounter: Payer: Self-pay | Admitting: Internal Medicine

## 2017-04-30 ENCOUNTER — Ambulatory Visit (INDEPENDENT_AMBULATORY_CARE_PROVIDER_SITE_OTHER): Payer: BC Managed Care – PPO | Admitting: Internal Medicine

## 2017-04-30 VITALS — BP 140/62 | HR 76 | Ht 64.5 in | Wt 230.0 lb

## 2017-04-30 DIAGNOSIS — I313 Pericardial effusion (noninflammatory): Secondary | ICD-10-CM | POA: Diagnosis not present

## 2017-04-30 DIAGNOSIS — M069 Rheumatoid arthritis, unspecified: Secondary | ICD-10-CM | POA: Diagnosis not present

## 2017-04-30 DIAGNOSIS — I3139 Other pericardial effusion (noninflammatory): Secondary | ICD-10-CM

## 2017-04-30 LAB — MULTIPLE MYELOMA PANEL, SERUM
Albumin SerPl Elph-Mcnc: 3.6 g/dL (ref 2.9–4.4)
Albumin/Glob SerPl: 1.4 (ref 0.7–1.7)
Alpha 1: 0.1 g/dL (ref 0.0–0.4)
Alpha2 Glob SerPl Elph-Mcnc: 1 g/dL (ref 0.4–1.0)
B-Globulin SerPl Elph-Mcnc: 1.1 g/dL (ref 0.7–1.3)
Gamma Glob SerPl Elph-Mcnc: 0.5 g/dL (ref 0.4–1.8)
Globulin, Total: 2.7 g/dL (ref 2.2–3.9)
IgA, Qn, Serum: 97 mg/dL (ref 87–352)
IgG, Qn, Serum: 542 mg/dL — ABNORMAL LOW (ref 700–1600)
IgM, Qn, Serum: 95 mg/dL (ref 26–217)
Total Protein: 6.3 g/dL (ref 6.0–8.5)

## 2017-04-30 NOTE — Patient Instructions (Signed)
Your physician wants you to follow-up in: 6 months with Dr. Hilty. You will receive a reminder letter in the mail two months in advance. If you don't receive a letter, please call our office to schedule the follow-up appointment.    

## 2017-04-30 NOTE — Progress Notes (Signed)
OFFICE CONSULT NOTE  Chief Complaint:  Follow-up studies  Primary Care Physician: Anda Kraft, MD  HPI:  Jocelyn Kaufman is a 61 y.o. female who is being seen today for the evaluation of intermittent chest pressure at the request of Anda Kraft, MD. Jocelyn Kaufman is a 61 year old female with a history of type 2 diabetes, depression, hyperlipidemia, hypertension, left ulnar neuropathy, possible obstructive sleep apnea, and recent extensive workup at the [facility] for possible Cushing's disease. Per their report it does not seem that she has this although she was noted to have bilateral adrenal hyperplasia. I first met [name] in 2012 which time she was hospitalized for chest pain and underwent a heart catheterization after an abnormal nuclear stress test, which I performed demonstrating normal coronary arteries. As part of her workup at the [facility] she underwent an echocardiogram. I reviewed the echocardiogram report but did not have the images to directly review. This demonstrated an LVEF of 55%, no regional wall motion abnormalities, mild left atrial enlargement, and a small, hemodynamically significant pericardial effusion. RVSP was normal at 32 mmHg. The study was performed on 02/11/2017. She says that over the past week or 2 she's had some more intermittent chest pressure. This is substernal and does not radiate. It's not worse with exertion or relieved by rest. The etiology of her pericardial effusion is not clear however [facility] suggested a workup for immune disorders. When I asked her about family history, she was somewhat unclear about whether autoimmune disorders were in her family. She also inquired today for me to fill out paperwork supporting disability. She is a 6 grade teacher. I reiterated that I did not believe that at this point she hasn't developed disabling cardiovascular condition, although she has multiple medical problems which are currently being addressed by her primary  care provider.  04/30/2017  Santiago Glad Mitchum returns today for follow-up. As per our previous plan she underwent a repeat echocardiogram to evaluate her small pericardial effusion. The study on 03/28/2017 showed an EF of 19-37%, grade 2 diastolic dysfunction and a small to moderate sized pericardial effusion without tamponade features. We attempted to contact the patient on 03/29/2017 and 04/01/2017 but received a smart call blocker response. She was then notified of results and a copy of the echo report was left per her request at the front desk (see notes attached to the echo dated 03/28/2017). Based on those findings and the acute change in size of the pericardial effusion, I recommended a repeat echo in one month to rule out any worsening pericardial effusion. She expressed interest in following up after that limited echo. A limited echo to evaluate the pericardial effusion was performed yesterday on 04/29/2017. I personally reviewed the study and EF has improved to 60-65%, again grade 2 diastolic dysfunction is noted with a much smaller pericardial effusion. There was no evidence of tamponade physiology. When we initially spoke she reported that she had not received any information about the studies and was unclear why she needed to echoes. She seems somewhat frustrated and overwhelmed with the myriad of doctors that she's been seeing. Recently she was seen by Dr. Kathlene November, a rheumatologist, who diagnosed her with rheumatoid arthritis. She's been started on methotrexate. She says that she has not noticed a significant difference in swelling of her hands, however it may be improving her pericardial effusion. She does not report any significant persistent chest discomfort or shortness of breath, rather occasional episodes.  PMHx:  Past Medical History:  Diagnosis Date  . Adrenal adenoma   . 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 Depression   . Diabetes mellitus without complication ([place])   . Hyperlipidemia   . Hypertension     . Hypothyroidism   . Meralgia paresthetica of right side 01/10/2017  . Migraine   . Pericardial effusion   . 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 APPENDECTOMY    . CYST REMOVAL LEG Right    Removed from right thigh joint  . TONSILLECTOMY    . TOTAL VAGINAL HYSTERECTOMY    . UMBILICAL HERNIA REPAIR      FAMHx:  Family History  Problem Relation Age of Onset  . Hypertension Mother   . Stroke Mother   . Kidney failure Mother   . Heart disease Mother        heart murmur  . Glaucoma Mother   . Stroke Father   . COPD Father   . Cancer - Prostate Father   . Colon cancer Father     SOCHx:   reports that she has never smoked. She has never used smokeless tobacco. She reports that she drinks alcohol. She reports that she does not use drugs.  ALLERGIES:  Allergies  Allergen Reactions  . Invokana [Canagliflozin] Other (See Comments)    Urinary incontinence  . Percocet [Oxycodone-Acetaminophen] Nausea And Vomiting  . Prilosec [Omeprazole] Rash  . Rogaine [Minoxidil] Rash  . Victoza [Liraglutide] Itching    ROS: Pertinent items noted in HPI and remainder of comprehensive ROS otherwise negative.  HOME MEDS: Current Outpatient Prescriptions on File Prior to Visit  Medication Sig Dispense Refill  . albuterol (PROVENTIL HFA;VENTOLIN HFA) 108 (90 Base) MCG/ACT inhaler Inhale two puffs every four to six hours as needed for cough or wheeze. 1 Inhaler 1  . amLODipine (NORVASC) 5 MG tablet Take 5 mg by mouth daily.    . Aspirin-Acetaminophen-Caffeine (EXCEDRIN MIGRAINE PO) Take 2 tablets by mouth as needed.    . Biotin 1 MG CAPS Take 1 % by mouth daily.    . cetirizine (ZYRTEC) 10 MG tablet Take 10 mg by mouth daily.    . Dulaglutide (TRULICITY) 1.5 SE/3.9RV SOPN Inject into the skin.    Marland Kitchen EPINEPHrine (EPIPEN 2-PAK) 0.3 mg/0.3 mL IJ SOAJ injection Use as directed for life-threatening allergic reaction. 2 Device 1  .  escitalopram (LEXAPRO) 20 MG tablet Take 20 mg by mouth daily.    Marland Kitchen FERROUS SULFATE PO Take 142 mcg by mouth daily.     Marland Kitchen glyBURIDE-metformin (GLUCOVANCE) 2.5-500 MG per tablet Take 1 tablet by mouth 2 (two) times daily with a meal.    . hydrALAZINE (APRESOLINE) 25 MG tablet Take 25 mg by mouth 2 (two) times daily.    Marland Kitchen ibuprofen (ADVIL,MOTRIN) 200 MG tablet Take 200 mg by mouth every 6 (six) hours as needed.    . Insulin Glargine (BASAGLAR KWIKPEN) 100 UNIT/ML SOPN Inject 30-50 Units into the skin daily.     Marland Kitchen levothyroxine (SYNTHROID) 125 MCG tablet Take 125 mcg by mouth daily before breakfast. BRAND ONLY    . LORazepam (ATIVAN) 0.5 MG tablet Take 0.5 mg by mouth as needed for anxiety.    . metaxalone (SKELAXIN) 800 MG tablet Take 800 mg by mouth 3 (three) times daily as needed for muscle spasms.    . Multiple Vitamins-Minerals (CENTRUM SILVER PO) Take 1 tablet by mouth daily.    Marland Kitchen torsemide (DEMADEX) 20 MG tablet  Take 20 mg by mouth as needed.    . TRAVATAN Z 0.004 % SOLN ophthalmic solution Place 0.004 drops into both eyes daily.    . vitamin B-12 (CYANOCOBALAMIN) 100 MCG tablet Take 100 mcg by mouth 3 (three) times a week.     No current facility-administered medications on file prior to visit.     LABS/IMAGING: No results found for this or any previous visit (from the past 48 hour(s)). No results found.  LIPID PANEL: No results found for: CHOL, TRIG, HDL, CHOLHDL, VLDL, LDLCALC, LDLDIRECT  WEIGHTS: Wt Readings from Last 3 Encounters:  04/30/17 230 lb (104.3 kg)  04/29/17 229 lb 14.4 oz (104.3 kg)  03/14/17 227 lb 9.6 oz (103.2 kg)    VITALS: BP 140/62   Pulse 76   Ht 5' 4.5" (1.638 m)   Wt 230 lb (104.3 kg)   BMI 38.87 kg/m   EXAM: Deferred  EKG: Deferred  ASSESSMENT: 1. Pericardial effusion - possibly related to rheumatoid arthritis 2. Newly diagnosed rheumatoid arthritis 3. History of false positive Myoview in 2012 with normal coronaries by  catheterization 4. 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   Mrs. Lynk has subsequently been found to have rheumatoid arthritis and is on methotrexate. She had an enlarging pericardial effusion however it is now small in size, one month after her most recent study. I suspect this could be related to rheumatoid arthritis. The prevalence of pericardial effusion and RA is about 30% however RA as the cause of pericardial effusion is rare, probably less than 5%. Nevertheless, the goal is treating the underlying RA and the effusion may resolve. Most patients with this are asymptomatic. The literature suggests that there is rarely a need for NSAIDs or colchicine. Steroids would only be used as needed for the RA.  I would recommend follow-up for clinical evaluation in 6 months at which time we may obtain another limited echo to see if her effusion has resolved.  Pixie Casino, MD, [place] [facility]  Attending Cardiologist  Direct Dial: [phone]  Fax: [phone]  Website:  [email or web address] 04/30/2017, 1:51 PM

## 2017-05-24 ENCOUNTER — Ambulatory Visit (INDEPENDENT_AMBULATORY_CARE_PROVIDER_SITE_OTHER): Payer: BC Managed Care – PPO

## 2017-05-24 DIAGNOSIS — J309 Allergic rhinitis, unspecified: Secondary | ICD-10-CM

## 2017-05-29 ENCOUNTER — Telehealth: Payer: Self-pay | Admitting: Internal Medicine

## 2017-05-29 NOTE — Telephone Encounter (Signed)
[facility] requests clearance for: 1. Type of surgery: colonoscopy 2. Date of surgery: Sept. 13, 2018 3. Surgeon: Dr. Benson Norway 4. Medications that need to be held & how long: none specified  5. Fax and/or Phone: (p) [phone]  (f) [phone]

## 2017-06-03 NOTE — Telephone Encounter (Signed)
Clearance routed via EPIC 

## 2017-06-03 NOTE — Telephone Encounter (Signed)
Acceptable risk for colonoscopy from a cardiac standpoint. No anticoagulation to hold.  Dr. Lemmie Evens

## 2017-06-24 ENCOUNTER — Ambulatory Visit (INDEPENDENT_AMBULATORY_CARE_PROVIDER_SITE_OTHER): Payer: BC Managed Care – PPO | Admitting: Neurology

## 2017-06-24 ENCOUNTER — Encounter: Payer: Self-pay | Admitting: Neurology

## 2017-06-24 ENCOUNTER — Ambulatory Visit (INDEPENDENT_AMBULATORY_CARE_PROVIDER_SITE_OTHER): Payer: BC Managed Care – PPO

## 2017-06-24 VITALS — BP 162/84 | HR 92 | Ht 64.0 in | Wt 232.0 lb

## 2017-06-24 DIAGNOSIS — J309 Allergic rhinitis, unspecified: Secondary | ICD-10-CM

## 2017-06-24 DIAGNOSIS — R635 Abnormal weight gain: Secondary | ICD-10-CM

## 2017-06-24 DIAGNOSIS — G4733 Obstructive sleep apnea (adult) (pediatric): Secondary | ICD-10-CM

## 2017-06-24 DIAGNOSIS — Z9989 Dependence on other enabling machines and devices: Secondary | ICD-10-CM | POA: Diagnosis not present

## 2017-06-24 NOTE — Patient Instructions (Signed)
Please continue using your CPAP regularly. While your insurance requires that you use CPAP at least 4 hours each night on 70% of the nights, I recommend, that you not skip any nights and use it throughout the night if you can. Getting used to CPAP and staying with the treatment long term does take time and patience and discipline. Untreated obstructive sleep apnea when it is moderate to severe can have an adverse impact on cardiovascular health and raise her risk for heart disease, arrhythmias, hypertension, congestive heart failure, stroke and diabetes. Untreated obstructive sleep apnea causes sleep disruption, nonrestorative sleep, and sleep deprivation. This can have an impact on your day to day functioning and cause daytime sleepiness and impairment of cognitive function, memory loss, mood disturbance, and problems focussing. Using CPAP regularly can improve these symptoms.  Please contact [facility] for replacement supplies. You are up for changing the filter at this time.   As dicussed, we will increase your CPAP pressure to 8 cm to help optimize your treatment.   Keep up the good work! We will see you back in 6 months for sleep apnea check up. You can see one of our nurse practitioners as you are stable.

## 2017-06-24 NOTE — Progress Notes (Signed)
Subjective:    Patient ID: Jocelyn Kaufman is a 61 y.o. female.  HPI     Interim history:   Jocelyn Kaufman is a 61 year old right-handed woman with an underlying medical history of Cushing's disease, depression, diabetes with suboptimal control, hyperlipidemia, hypertension, migraines, meralgia paresthetica on the right side, and paresthesias, and obesity, who presents for follow-up consultation of her obstructive sleep apnea, after recent sleep study testing. The patient is unaccompanied today. I first met her on 03/07/2017 at the request of her primary care physician, at which time she reported snoring and excessive daytime somnolence. She had a prior diagnosis of OSA with sleep study testing more than 10 years prior and had not been using CPAP for at least 8 years. I suggested we proceed with a sleep study. She had a baseline sleep study, followed by a CPAP titration study. I went over her test results with her in detail today. Baseline sleep study from 03/17/2017 showed a sleep efficiency of 78%, sleep latency of 58 minutes, REM latency was 115 minutes. She had an increased percentage of stage I sleep, slow-wave sleep was 10.3% and REM sleep was mildly increased at 28.6%. She had a total AHI of 15.1 per hour, REM AHI of 34.8 per hour, supine AHI of 27.4 per hour. Average oxygen saturation was 98%, nadir was 85%. She had no significant PLMS or EKG or EEG changes. Based on her test results I suggested she return for a full night CPAP titration study. She had this on 04/02/2017. Sleep efficiency was 78.9%, sleep latency 17.5 minutes, REM latency 202 minutes. She was fitted with medium nasal pillows and CPAP was titrated from 5 cm to 7 cm. On the final pressure her AHI was 0 per hour with supine non-REM sleep achieved an O2 nadir of 90%. She had an increased percentage of stage II sleep, slow-wave sleep was 13.8% and REM sleep was 16%. She had no significant PLMS. Based on her test results I prescribed CPAP therapy  for home use at 7 cm.  Today, 06/24/2017 (all dictated new, as well as above notes, some dictation done in note pad or Word, outside of chart, may appear as copied):   I reviewed her CPAP compliance data from 05/24/2017 through 06/22/2017, which is a total of 30 days, during which time she used her machine 29 days with percent used days greater than 4 hours at 93%, indicating excellent compliance with an average usage of 7 hours and 32 minutes, residual AHI borderline at 5.9 per hour, leaked low with the 95th percentile at 4.2 L/m on a pressure of 7 cm with EPR of 3. Residual AHI appears to be primarily obstructive in nature. She reports doing fairly well with her CPAP, she does notice some improvement in her sleep quality. She has had interim weight gain. At the time of her sleep study she weighed about 222 and current weight is about 232 pounds. She does report increase in weight, particularly secondary to stress and also not being able to exercise very much. She injured her left foot recently, second toe. She does notice an improvement in her sleep consolidation and daytime sluggishness, feels less "foggy headed".   The patient's allergies, current medications, family history, past medical history, past social history, past surgical history and problem list were reviewed and updated as appropriate.   Previously (copied from previous notes for reference):   03/07/2017: (She) reports snoring and excessive daytime somnolence. I reviewed your office note from 02/27/2017, which you  kindly included. Her Epworth Sleepiness Scale score is 9 out of 24, fatigue score is 61 out of 63. She reports a prior diagnosis of obstructive sleep apnea and had sleep study testing over 10 years ago. She has not been using CPAP for several years, perhaps for the past 8 years. She is a nonsmoker and drinks alcohol rarely. She drinks caffeine in the form of coffee about 1 12 oz mugs per day. She is single, has no children, she  lives alone. She is on medical leave currently.  She has a bedtime of 9-11 PM and WT is 6-8 AM. She denies RLS symptoms, but has numbness in the feet. She reports a family history of sleep apnea and one of her half-sisters and a nephew. She has nocturia about twice per average night and occasional morning headaches. She has her old CPAP machine but could no longer get supplies and her DME company went out of business.  Her Past Medical History Is Significant For: Past Medical History:  Diagnosis Date  . Adrenal adenoma   . Cushing disease ([place])   . Depression   . Diabetes mellitus without complication ([place])   . Hyperlipidemia   . Hypertension   . Hypothyroidism   . Meralgia paresthetica of right side 01/10/2017  . Migraine   . Pericardial effusion   . 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 APPENDECTOMY    . CYST REMOVAL LEG Right    Removed from right thigh joint  . TONSILLECTOMY    . TOTAL VAGINAL HYSTERECTOMY    . UMBILICAL HERNIA REPAIR      Her Family History Is Significant For: Family History  Problem Relation Age of Onset  . Hypertension Mother   . Stroke Mother   . Kidney failure Mother   . Heart disease Mother        heart murmur  . Glaucoma Mother   . Stroke Father   . COPD Father   . Cancer - Prostate Father   . Colon cancer Father     Her Social History Is Significant For: Social History   Social History  . Marital status: Single    Spouse name: N/A  . Number of children: 0  . Years of education: Masters   Occupational History  . [employer]    Social History Main Topics  . Smoking status: Never Smoker  . Smokeless tobacco: Never Used  . Alcohol use Yes     Comment: occ  . Drug use: No  . Sexual activity: No   Other Topics Concern  . None   Social History Narrative   Lives alone   Caffeine use: 2 cups coffee per day   Tea  sometimes   Soda- 3 x/week   Right-handed    Her Allergies Are:  Allergies  Allergen Reactions  . Invokana [Canagliflozin] Other (See Comments)    Urinary incontinence  . Percocet [Oxycodone-Acetaminophen] Nausea And Vomiting  . Prilosec [Omeprazole] Rash  . Rogaine [Minoxidil] Rash  . Victoza [Liraglutide] Itching  :   Her Current Medications Are:  Outpatient Encounter Prescriptions as of 06/24/2017  Medication Sig  . albuterol (PROVENTIL HFA;VENTOLIN HFA) 108 (90 Base) MCG/ACT inhaler Inhale two puffs every four to six hours as needed for cough or wheeze.  Marland Kitchen amLODipine (NORVASC) 5 MG tablet Take 5 mg by mouth daily.  . Aspirin-Acetaminophen-Caffeine (EXCEDRIN MIGRAINE PO) Take  2 tablets by mouth as needed.  . Biotin 1 MG CAPS Take 1 % by mouth daily.  . cetirizine (ZYRTEC) 10 MG tablet Take 10 mg by mouth daily.  . Cholecalciferol (VITAMIN D-3) 1000 units CAPS Take 1,000 Units by mouth daily.  . Dulaglutide (TRULICITY) 1.5 ZH/0.8MV SOPN Inject into the skin.  Marland Kitchen EPINEPHrine (EPIPEN 2-PAK) 0.3 mg/0.3 mL IJ SOAJ injection Use as directed for life-threatening allergic reaction.  Marland Kitchen escitalopram (LEXAPRO) 20 MG tablet Take 20 mg by mouth daily.  Marland Kitchen FERROUS SULFATE PO Take 142 mcg by mouth daily.   . folic acid (FOLVITE) 1 MG tablet Take 2 mg by mouth daily.  Marland Kitchen glyBURIDE-metformin (GLUCOVANCE) 2.5-500 MG per tablet Take 1 tablet by mouth 2 (two) times daily with a meal.  . hydrALAZINE (APRESOLINE) 25 MG tablet Take 25 mg by mouth 2 (two) times daily.  Marland Kitchen ibuprofen (ADVIL,MOTRIN) 200 MG tablet Take 200 mg by mouth every 6 (six) hours as needed.  . Insulin Glargine (BASAGLAR KWIKPEN) 100 UNIT/ML SOPN Inject 30-50 Units into the skin daily.   Marland Kitchen levothyroxine (SYNTHROID) 125 MCG tablet Take 125 mcg by mouth daily before breakfast. BRAND ONLY  . LORazepam (ATIVAN) 0.5 MG tablet Take 0.5 mg by mouth as needed for anxiety.  . Magnesium 400 MG TABS Take 1 tablet by mouth daily.  . metaxalone  (SKELAXIN) 800 MG tablet Take 800 mg by mouth 3 (three) times daily as needed for muscle spasms.  . methotrexate (RHEUMATREX) 2.5 MG tablet TK 6 TS PO 1 TIME WEEKLY  . Multiple Vitamins-Minerals (CENTRUM SILVER PO) Take 1 tablet by mouth daily.  . ramipril (ALTACE) 5 MG capsule Take 1 capsule by mouth daily.  . TRAVATAN Z 0.004 % SOLN ophthalmic solution Place 0.004 drops into both eyes daily.  . vitamin B-12 (CYANOCOBALAMIN) 100 MCG tablet Take 100 mcg by mouth 3 (three) times a week.  . [DISCONTINUED] folic acid (FOLVITE) 1 MG tablet TK 1 T PO QD  . [DISCONTINUED] torsemide (DEMADEX) 20 MG tablet Take 20 mg by mouth as needed.  . [DISCONTINUED] Vitamin D, Ergocalciferol, (DRISDOL) 50000 units CAPS capsule Take 50,000 Units by mouth every 7 (seven) days.   No facility-administered encounter medications on file as of 06/24/2017.   :  Review of Systems:  Out of a complete 14 point review of systems, all are reviewed and negative with the exception of these symptoms as listed below: Review of Systems  Neurological:       Pt presents today to discuss her cpap. Pt reports using her cpap compliantly and does sometimes feel better after using her cpap.    Objective:  Neurological Exam  Physical Exam Physical Examination:   Vitals:   06/24/17 1024  BP: (!) 162/84  Pulse: 92    General Examination: The patient is a very pleasant 61 y.o. female in no acute distress. She appears well-developed and well-nourished and well groomed.   HEENT: Normocephalic, atraumatic, pupils are equal. Extraocular tracking is good without limitation to gaze excursion or nystagmus noted. Wears corrective eyeglasses. Normal smooth pursuit is noted. Hearing is grossly intact. Face is symmetric with normal facial animation and normal facial sensation. Speech is clear with no dysarthria noted. There is no hypophonia. There is no lip, neck/head, jaw or voice tremor. Neck is supple with full range of passive and active  motion. There are no carotid bruits on auscultation. Oropharynx exam reveals: mild mouth dryness, adequate dental hygiene and moderate airway crowding, due to wider tongue  and redundant soft palate. Mallampati is class II. Tongue protrudes centrally and palate elevates symmetrically.   Chest: Clear to auscultation without wheezing, rhonchi or crackles noted.  Heart: S1+S2+0, regular and normal without murmurs, rubs or gallops noted.   Abdomen: Soft, non-tender and non-distended with normal bowel sounds appreciated on auscultation.  Extremities: There is no pitting edema in the distal lower extremities bilaterally. Pedal pulses are intact.  Skin: Warm and dry without trophic changes noted. There are no varicose veins in the distal LEs.  Musculoskeletal: exam reveals no obvious joint deformities, tenderness or joint swelling or erythema, with the exception of some mild swelling noted in the proximal finger joints, particularly digits 3 and 4 bilaterally, slight deformity noted left second toe, hammertoe-like appearance.   Neurologically:  Mental status: The patient is awake, alert and oriented in all 4 spheres. Her immediate and remote memory, attention, language skills and fund of knowledge are appropriate. There is no evidence of aphasia, agnosia, apraxia or anomia. Speech is clear with normal prosody and enunciation. Thought process is linear. Mood is normal and affect is normal.  Cranial nerves II - XII are as described above under HEENT exam. In addition: shoulder shrug is normal with equal shoulder height noted. Motor exam: Normal bulk, strength and tone is noted. There is no tremor. Romberg is negative. Reflexes are 1+ throughout. Fine motor skills and coordination: grossly intact.  Cerebellar testing: No dysmetria or intention tremor. There is no truncal or gait ataxia. Does not feel comfortable doing tandem walk.  Sensory exam: intact to light touch in the upper and lower  extremities.  Gait, station and balance: She stands with mild difficulty. No veering to one side is noted. No leaning to one side is noted. Posture is age-appropriate and stance is somewhat wider based. Gait shows normal stride length and normal pace. No problems turning are noted.  Assessment and Plan:  In summary, Jocelyn Kaufman is a very pleasant 61 year old female with an underlying medical history of Cushing's disease, depression, diabetes with suboptimal control, hyperlipidemia, hypertension, migraines, meralgia paresthetica on the right side, and paresthesias, and obesity, who presents for follow-up consultation of her obstructive sleep apnea, after recent sleep study testing. She had a baseline sleep study in May 2018 which indicated moderate obstructive sleep apnea. She return for a CPAP titration study in June 2018 and has done well with CPAP of 7 cm. She had some interim weight gain and her residual AHI is borderline at 5.9 at this time. I would like to increase her set pressure to 8 cm at this time. She is agreeable. She is commended for her treatment adherence and has also noticed improvement of her sleep quality, sleep consolidation and daytime symptoms. She is encouraged to continue with CPAP therapy and work on weight loss. I suggested a six-month follow-up with one of our nurse practitioners. I answered all her questions today and she was in agreement.  I spent 25 minutes in total face-to-face time with the patient, more than 50% of which was spent in counseling and coordination of care, reviewing test results, reviewing medication and discussing or reviewing the diagnosis of OSA, its prognosis and treatment options. Pertinent laboratory and imaging test results that were available during this visit with the patient were reviewed by me and considered in my medical decision making (see chart for details).

## 2017-07-31 ENCOUNTER — Ambulatory Visit (INDEPENDENT_AMBULATORY_CARE_PROVIDER_SITE_OTHER): Payer: BC Managed Care – PPO

## 2017-07-31 DIAGNOSIS — J309 Allergic rhinitis, unspecified: Secondary | ICD-10-CM | POA: Diagnosis not present

## 2017-08-08 ENCOUNTER — Ambulatory Visit (INDEPENDENT_AMBULATORY_CARE_PROVIDER_SITE_OTHER): Payer: BC Managed Care – PPO

## 2017-08-08 DIAGNOSIS — J309 Allergic rhinitis, unspecified: Secondary | ICD-10-CM

## 2017-08-13 ENCOUNTER — Ambulatory Visit (INDEPENDENT_AMBULATORY_CARE_PROVIDER_SITE_OTHER): Payer: BC Managed Care – PPO | Admitting: *Deleted

## 2017-08-13 DIAGNOSIS — J309 Allergic rhinitis, unspecified: Secondary | ICD-10-CM | POA: Diagnosis not present

## 2017-08-19 ENCOUNTER — Ambulatory Visit (INDEPENDENT_AMBULATORY_CARE_PROVIDER_SITE_OTHER): Payer: BC Managed Care – PPO

## 2017-08-19 DIAGNOSIS — J309 Allergic rhinitis, unspecified: Secondary | ICD-10-CM | POA: Diagnosis not present

## 2017-08-26 ENCOUNTER — Ambulatory Visit (INDEPENDENT_AMBULATORY_CARE_PROVIDER_SITE_OTHER): Payer: BC Managed Care – PPO | Admitting: *Deleted

## 2017-08-26 DIAGNOSIS — J309 Allergic rhinitis, unspecified: Secondary | ICD-10-CM

## 2017-09-02 ENCOUNTER — Telehealth: Payer: Self-pay | Admitting: Internal Medicine

## 2017-09-02 NOTE — Telephone Encounter (Signed)
New message    Patient request lab order prior to appt 12/10. Please call if lab work needed per patient.

## 2017-09-02 NOTE — Telephone Encounter (Signed)
Returned call to patient, advised per chart review it seems that she will be due for labs in December.  Advised I would route to primary nurse for lab orders.  Patient aware and verbalized understanding.

## 2017-09-04 NOTE — Telephone Encounter (Signed)
Routed to MD to review and advise on what labs should be ordered, if any, prior to MD OV in December. Could not locate in Epic where Dr. Debara Pickett has ordered labs in the past.

## 2017-09-05 NOTE — Telephone Encounter (Signed)
LM that MD has not recommended any lab work be done prior to Dec visit

## 2017-09-05 NOTE — Telephone Encounter (Signed)
I will not need any labs in addition to what Dr. Shelia Media has already obtained.  Dr. Lemmie Evens

## 2017-09-23 ENCOUNTER — Telehealth: Payer: Self-pay | Admitting: Internal Medicine

## 2017-09-23 NOTE — Telephone Encounter (Signed)
Spoke with patient. She wanted to verify when she was due to see Dr. Debara Pickett. She states she got a notice she was due in Dec, called to schedule and was told she was due in Jan, and appt was made for Dec. Advised she was scheduled to come back 6 months from July 2018 visit - approx Jan 2019.. But is OK to come in for Dec 10 appt. She voiced understanding. She inquired on pericardial effusion and if follow up testing is necessary. Spoke with MD, and per guidelines, this test does not need to be repeated every 6 months unless there are new symptoms. Advised this would be reviewed at her upcoming appt.

## 2017-09-23 NOTE — Telephone Encounter (Signed)
New Message  Pt call requesting to speak with RN about appt scheduled for 12/10. Please call back to discuss

## 2017-09-24 ENCOUNTER — Ambulatory Visit (INDEPENDENT_AMBULATORY_CARE_PROVIDER_SITE_OTHER): Payer: BC Managed Care – PPO | Admitting: *Deleted

## 2017-09-24 DIAGNOSIS — J309 Allergic rhinitis, unspecified: Secondary | ICD-10-CM

## 2017-10-07 ENCOUNTER — Ambulatory Visit: Payer: BC Managed Care – PPO | Admitting: Internal Medicine

## 2017-10-17 ENCOUNTER — Encounter: Payer: Self-pay | Admitting: Internal Medicine

## 2017-10-17 ENCOUNTER — Ambulatory Visit: Payer: BC Managed Care – PPO | Admitting: Internal Medicine

## 2017-10-17 VITALS — BP 148/52 | HR 81 | Ht 64.5 in | Wt 238.0 lb

## 2017-10-17 DIAGNOSIS — I313 Pericardial effusion (noninflammatory): Secondary | ICD-10-CM | POA: Diagnosis not present

## 2017-10-17 DIAGNOSIS — I10 Essential (primary) hypertension: Secondary | ICD-10-CM | POA: Diagnosis not present

## 2017-10-17 DIAGNOSIS — I3139 Other pericardial effusion (noninflammatory): Secondary | ICD-10-CM

## 2017-10-17 DIAGNOSIS — M069 Rheumatoid arthritis, unspecified: Secondary | ICD-10-CM

## 2017-10-17 NOTE — Patient Instructions (Addendum)
Your physician has requested that you have a limited echocardiogram in 6 months @ [address] - [address]. Echocardiography is a painless test that uses sound waves to create images of your heart. It provides your doctor with information about the size and shape of your heart and how well your heart's chambers and valves are working. This procedure takes approximately one hour. There are no restrictions for this procedure.  Your physician wants you to follow-up in: 6 months with Dr. Debara Pickett after limited echo. You will receive a reminder letter in the mail two months in advance. If you don't receive a letter, please call our office to schedule the follow-up appointment.

## 2017-10-17 NOTE — Progress Notes (Signed)
OFFICE CONSULT NOTE  Chief Complaint:  Follow-up pericardial effusion  Primary Care Physician: Deland Pretty, MD  HPI:  Jocelyn Kaufman is a 61 y.o. female who is being seen today for the evaluation of intermittent chest pressure at the request of Deland Pretty, MD. Jocelyn Kaufman is a 61 year old female with a history of type 2 diabetes, depression, hyperlipidemia, hypertension, left ulnar neuropathy, possible obstructive sleep apnea, and recent extensive workup at the [facility] for possible Cushing's disease. Per their report it does not seem that she has this although she was noted to have bilateral adrenal hyperplasia. I first met Jocelyn Kaufman in 2012 which time she was hospitalized for chest pain and underwent a heart catheterization after an abnormal nuclear stress test, which I performed demonstrating normal coronary arteries. As part of her workup at the [facility] she underwent an echocardiogram. I reviewed the echocardiogram report but did not have the images to directly review. This demonstrated an LVEF of 55%, no regional wall motion abnormalities, mild left atrial enlargement, and a small, hemodynamically significant pericardial effusion. RVSP was normal at 32 mmHg. The study was performed on 02/11/2017. She says that over the past week or 2 she's had some more intermittent chest pressure. This is substernal and does not radiate. It's not worse with exertion or relieved by rest. The etiology of her pericardial effusion is not clear however NIH suggested a workup for immune disorders. When I asked her about family history, she was somewhat unclear about whether autoimmune disorders were in her family. She also inquired today for me to fill out paperwork supporting disability. She is a 6 grade teacher. I reiterated that I did not believe that at this point she hasn't developed disabling cardiovascular condition, although she has multiple medical problems which are currently being addressed by  her primary care provider.  04/30/2017  Jocelyn Kaufman returns today for follow-up. As per our previous plan she underwent a repeat echocardiogram to evaluate her small pericardial effusion. The study on 03/28/2017 showed an EF of 62-69%, grade 2 diastolic dysfunction and a small to moderate sized pericardial effusion without tamponade features. We attempted to contact the patient on 03/29/2017 and 04/01/2017 but received a smart call blocker response. She was then notified of results and a copy of the echo report was left per her request at the front desk (see notes attached to the echo dated 03/28/2017). Based on those findings and the acute change in size of the pericardial effusion, I recommended a repeat echo in one month to rule out any worsening pericardial effusion. She expressed interest in following up after that limited echo. A limited echo to evaluate the pericardial effusion was performed yesterday on 04/29/2017. I personally reviewed the study and EF has improved to 60-65%, again grade 2 diastolic dysfunction is noted with a much smaller pericardial effusion. There was no evidence of tamponade physiology. When we initially spoke she reported that she had not received any information about the studies and was unclear why she needed to echoes. She seems somewhat frustrated and overwhelmed with the myriad of doctors that she's been seeing. Recently she was seen by Dr. Kathlene November, a rheumatologist, who diagnosed her with rheumatoid arthritis. She's been started on methotrexate. She says that she has not noticed a significant difference in swelling of her hands, however it may be improving her pericardial effusion. She does not report any significant persistent chest discomfort or shortness of breath, rather occasional episodes.  10/17/2017  Jocelyn Kaufman  returns today for follow-up.  It has been 6 months since I last saw her.  Her recent echo in July 2018 showed a slightly higher EF of 60-65% with a small pericardial  effusion which was improved.  Overall she feels much better.  She has started on a medication called Simponi for her RA in addition to methotrexate.  She is only had one dose but she responded well to it.  She denies any further chest heaviness, pressure pain or shortness of breath.  PMHx:  Past Medical History:  Diagnosis Date  . Adrenal adenoma   . 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 Depression   . Diabetes mellitus without complication ([place])   . Hyperlipidemia   . Hypertension   . Hypothyroidism   . Meralgia paresthetica of right side 01/10/2017  . Migraine   . Pericardial effusion   . 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 APPENDECTOMY    . CYST REMOVAL LEG Right    Removed from right thigh joint  . TONSILLECTOMY    . TOTAL VAGINAL HYSTERECTOMY    . UMBILICAL HERNIA REPAIR      FAMHx:  Family History  Problem Relation Age of Onset  . Hypertension Mother   . Stroke Mother   . Kidney failure Mother   . Heart disease Mother        heart murmur  . Glaucoma Mother   . Stroke Father   . COPD Father   . Cancer - Prostate Father   . Colon cancer Father     SOCHx:   reports that  has never smoked. she has never used smokeless tobacco. She reports that she drinks alcohol. She reports that she does not use drugs.  ALLERGIES:  Allergies  Allergen Reactions  . Invokana [Canagliflozin] Other (See Comments)    Urinary incontinence  . Percocet [Oxycodone-Acetaminophen] Nausea And Vomiting  . Prilosec [Omeprazole] Rash  . Rogaine [Minoxidil] Rash  . Victoza [Liraglutide] Itching    ROS: Pertinent items noted in HPI and remainder of comprehensive ROS otherwise negative.  HOME MEDS: Current Outpatient Medications on File Prior to Visit  Medication Sig Dispense Refill  . albuterol (PROVENTIL HFA;VENTOLIN HFA) 108 (90 Base) MCG/ACT inhaler Inhale two puffs every four to six hours as needed for cough or wheeze. 1 Inhaler 1    . amLODipine (NORVASC) 5 MG tablet Take 5 mg by mouth daily.    . Aspirin-Acetaminophen-Caffeine (EXCEDRIN MIGRAINE PO) Take 2 tablets by mouth as needed.    . Biotin 1 MG CAPS Take 1 % by mouth daily.    . cetirizine (ZYRTEC) 10 MG tablet Take 10 mg by mouth daily.    . Cholecalciferol (VITAMIN D-3) 5000 units TABS Take 5,000 Units by mouth once a week.     . Dulaglutide (TRULICITY) 1.5 EZ/6.6QH SOPN Inject 1.5 mg into the skin once a week.     Marland Kitchen EPINEPHrine (EPIPEN 2-PAK) 0.3 mg/0.3 mL IJ SOAJ injection Use as directed for life-threatening allergic reaction. 2 Device 1  . escitalopram (LEXAPRO) 20 MG tablet Take 20 mg by mouth daily.    . ferrous sulfate 325 (65 FE) MG tablet Take 365 mg by mouth daily.     . folic acid (FOLVITE) 1 MG tablet Take 2 mg by mouth daily.    Marland Kitchen glyBURIDE-metformin (GLUCOVANCE) 2.5-500 MG per tablet Take 1 tablet by mouth 2 (two) times daily with a meal.    .  Golimumab (SIMPONI [place]) Inject into the skin.    . hydrALAZINE (APRESOLINE) 25 MG tablet Take 25 mg by mouth 2 (two) times daily.    Marland Kitchen ibuprofen (ADVIL,MOTRIN) 200 MG tablet Take 200 mg by mouth every 6 (six) hours as needed.    . Insulin Glargine (BASAGLAR KWIKPEN) 100 UNIT/ML SOPN Inject 60-66 Units into the skin daily.     Marland Kitchen levothyroxine (SYNTHROID) 125 MCG tablet Take 125 mcg by mouth daily before breakfast. BRAND ONLY    . LORazepam (ATIVAN) 0.5 MG tablet Take 0.5 mg by mouth as needed for anxiety.    . Magnesium 500 MG TABS Take 1 tablet by mouth daily.    . metaxalone (SKELAXIN) 800 MG tablet Take 800 mg by mouth 3 (three) times daily as needed for muscle spasms.    . methotrexate (RHEUMATREX) 2.5 MG tablet TK 6 TS PO 1 TIME WEEKLY  3  . Methotrexate, Anti-Rheumatic, (METHOTREXATE, PF, ) Inject 0.8 Units into the skin once a week.    . Multiple Vitamins-Minerals (CENTRUM SILVER PO) Take 1 tablet by mouth daily.    . TRAVATAN Z 0.004 % SOLN ophthalmic solution Place 0.004 drops into both eyes daily.     . vitamin B-12 (CYANOCOBALAMIN) 100 MCG tablet Take 100 mcg by mouth daily.      No current facility-administered medications on file prior to visit.     LABS/IMAGING: No results found for this or any previous visit (from the past 48 hour(s)). No results found.  LIPID PANEL: No results found for: CHOL, TRIG, HDL, CHOLHDL, VLDL, LDLCALC, LDLDIRECT  WEIGHTS: Wt Readings from Last 3 Encounters:  10/17/17 238 lb (108 kg)  06/24/17 232 lb (105.2 kg)  04/30/17 230 lb (104.3 kg)    VITALS: BP (!) 148/52   Pulse 81   Ht 5' 4.5" (1.638 m)   Wt 238 lb (108 kg)   BMI 40.22 kg/m   EXAM: General appearance: alert, no distress and morbidly obese Neck: no carotid bruit, no JVD and thyroid not enlarged, symmetric, no tenderness/mass/nodules Lungs: clear to auscultation bilaterally Heart: regular rate and rhythm Abdomen: soft, non-tender; bowel sounds normal; no masses,  no organomegaly Extremities: extremities normal, atraumatic, no cyanosis or edema Pulses: 2+ and symmetric Skin: Skin color, texture, turgor normal. No rashes or lesions Neurologic: Grossly normal Psych: Pleasant  EKG: Normal sinus rhythm at 81-personally reviewed  ASSESSMENT: 1. Pericardial effusion - possibly related to rheumatoid arthritis 2. Newly diagnosed rheumatoid arthritis 3. History of false positive Myoview in 2012 with normal coronaries by catheterization 4. 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   Jocelyn Kaufman has had marked improvement in her pericardial effusion which seems to be mirroring rheumatoid arthritis.  She is recently started on additional medication which she seems to be responding to.  I suspect her effusion will continue to shrink or at least remained stable.  She is asymptomatic at this time.  We will plan to repeat a limited echo in about 6 months and follow-up with her at that time.  Pixie Casino, MD, [place]   Attending Cardiologist  Direct Dial: [phone]  Fax: [phone]  Website:  [email or web address] 10/17/2017, 10:22 AM

## 2017-10-24 ENCOUNTER — Ambulatory Visit (INDEPENDENT_AMBULATORY_CARE_PROVIDER_SITE_OTHER): Payer: BC Managed Care – PPO | Admitting: *Deleted

## 2017-10-24 DIAGNOSIS — J309 Allergic rhinitis, unspecified: Secondary | ICD-10-CM | POA: Diagnosis not present

## 2017-11-25 ENCOUNTER — Ambulatory Visit (INDEPENDENT_AMBULATORY_CARE_PROVIDER_SITE_OTHER): Payer: BC Managed Care – PPO | Admitting: *Deleted

## 2017-11-25 DIAGNOSIS — J309 Allergic rhinitis, unspecified: Secondary | ICD-10-CM

## 2017-12-03 DIAGNOSIS — J301 Allergic rhinitis due to pollen: Secondary | ICD-10-CM | POA: Diagnosis not present

## 2017-12-05 NOTE — Progress Notes (Signed)
VIALS EXP 12-06-18

## 2017-12-17 DIAGNOSIS — M7542 Impingement syndrome of left shoulder: Secondary | ICD-10-CM | POA: Insufficient documentation

## 2017-12-23 ENCOUNTER — Encounter: Payer: Self-pay | Admitting: Neurology

## 2017-12-25 ENCOUNTER — Ambulatory Visit (INDEPENDENT_AMBULATORY_CARE_PROVIDER_SITE_OTHER): Payer: BC Managed Care – PPO

## 2017-12-25 ENCOUNTER — Encounter: Payer: Self-pay | Admitting: Adult Health

## 2017-12-25 ENCOUNTER — Ambulatory Visit: Payer: BC Managed Care – PPO

## 2017-12-25 ENCOUNTER — Ambulatory Visit: Payer: BC Managed Care – PPO | Admitting: Adult Health

## 2017-12-25 VITALS — BP 137/75 | HR 83 | Ht 64.5 in | Wt 237.4 lb

## 2017-12-25 DIAGNOSIS — J309 Allergic rhinitis, unspecified: Secondary | ICD-10-CM

## 2017-12-25 DIAGNOSIS — G4733 Obstructive sleep apnea (adult) (pediatric): Secondary | ICD-10-CM | POA: Diagnosis not present

## 2017-12-25 DIAGNOSIS — Z9989 Dependence on other enabling machines and devices: Secondary | ICD-10-CM | POA: Diagnosis not present

## 2017-12-25 NOTE — Progress Notes (Addendum)
PATIENT: Jocelyn Kaufman DOB: 1956/09/12  REASON FOR VISIT: follow up-obstructive sleep apnea on CPAP HISTORY FROM: patient  HISTORY OF PRESENT ILLNESS: Today 12/25/17 Jocelyn Kaufman is a 62 year old female with a history of obstructive sleep apnea on CPAP.  She returns today for follow-up.  Her CPAP download indicates that she use her machine 30 out of 30 days for compliance of 100%.  She use her machine greater than 4 hours 29 out of 30 days for compliance of 97%.  On average she uses her machine 7 hours and 49 minutes.  Her residual AHI is 5.8 on 8 cm of water with EPR 3.  Her leak in the 95th percentile is 10.4 L/min.  She states that she has a hard time keeping her mask in place at night.  Reports that she may be interested in trying a new mask if possible.  She also notes that she has had weight fluctuations since December.  Reports that this could be a result of her Cushing syndrome.  She returns today for evaluation.  HISTORY 06/24/17 Jocelyn Kaufman): reviewed her CPAP compliance data from 05/24/2017 through 06/22/2017, which is a total of 30 days, during which time she used her machine 29 days with percent used days greater than 4 hours at 93%, indicating excellent compliance with an average usage of 7 hours and 32 minutes, residual AHI borderline at 5.9 per hour, leaked low with the 95th percentile at 4.2 L/m on a pressure of 7 cm with EPR of 3. Residual AHI appears to be primarily obstructive in nature. She reports doing fairly well with her CPAP, she does notice some improvement in her sleep quality. She has had interim weight gain. At the time of her sleep study she weighed about 222 and current weight is about 232 pounds. She does report increase in weight, particularly secondary to stress and also not being able to exercise very much. She injured her left foot recently, second toe. She does notice an improvement in her sleep consolidation and daytime sluggishness, feels less "foggy headed".    REVIEW  OF SYSTEMS: Out of a complete 14 system review of symptoms, the patient complains only of the following symptoms, and all other reviewed systems are negative.  ALLERGIES: Allergies  Allergen Reactions  . Invokana [Canagliflozin] Other (See Comments)    Urinary incontinence  . Percocet [Oxycodone-Acetaminophen] Nausea And Vomiting  . Prilosec [Omeprazole] Rash  . Rogaine [Minoxidil] Rash  . Victoza [Liraglutide] Itching    HOME MEDICATIONS: Outpatient Medications Prior to Visit  Medication Sig Dispense Refill  . albuterol (PROVENTIL HFA;VENTOLIN HFA) 108 (90 Base) MCG/ACT inhaler Inhale two puffs every four to six hours as needed for cough or wheeze. 1 Inhaler 1  . amLODipine (NORVASC) 5 MG tablet Take 5 mg by mouth daily.    . Aspirin-Acetaminophen-Caffeine (EXCEDRIN MIGRAINE PO) Take 2 tablets by mouth as needed.    . Biotin 1 MG CAPS Take 1 % by mouth daily.    . cetirizine (ZYRTEC) 10 MG tablet Take 10 mg by mouth daily.    . Cholecalciferol (VITAMIN D-3) 5000 units TABS Take 5,000 Units by mouth once a week.     . Dulaglutide (TRULICITY) 1.5 YH/8.8IL SOPN Inject 1.5 mg into the skin once a week.     Marland Kitchen EPINEPHrine (EPIPEN 2-PAK) 0.3 mg/0.3 mL IJ SOAJ injection Use as directed for life-threatening allergic reaction. 2 Device 1  . escitalopram (LEXAPRO) 20 MG tablet Take 20 mg by mouth daily.    Marland Kitchen  ferrous sulfate 325 (65 FE) MG tablet Take 365 mg by mouth daily.     . folic acid (FOLVITE) 1 MG tablet Take 2 mg by mouth daily.    Marland Kitchen glyBURIDE-metformin (GLUCOVANCE) 2.5-500 MG per tablet Take 1 tablet by mouth 2 (two) times daily with a meal.    . Golimumab (SIMPONI [place]) Inject into the skin.    . hydrALAZINE (APRESOLINE) 25 MG tablet Take 25 mg by mouth 2 (two) times daily.    Marland Kitchen ibuprofen (ADVIL,MOTRIN) 200 MG tablet Take 200 mg by mouth every 6 (six) hours as needed.    . Insulin Glargine (BASAGLAR KWIKPEN) 100 UNIT/ML SOPN Inject 60-66 Units into the skin daily.     Marland Kitchen levothyroxine  (SYNTHROID) 125 MCG tablet Take 125 mcg by mouth daily before breakfast. BRAND ONLY    . LORazepam (ATIVAN) 0.5 MG tablet Take 0.5 mg by mouth as needed for anxiety.    . Magnesium 500 MG TABS Take 1 tablet by mouth daily.    . metaxalone (SKELAXIN) 800 MG tablet Take 800 mg by mouth 3 (three) times daily as needed for muscle spasms.    . Methotrexate, Anti-Rheumatic, (METHOTREXATE, PF, [place]) Inject 0.8 Units into the skin once a week.    . Multiple Vitamins-Minerals (CENTRUM SILVER PO) Take 1 tablet by mouth daily.    . TRAVATAN Z 0.004 % SOLN ophthalmic solution Place 0.004 drops into both eyes daily.    . vitamin B-12 (CYANOCOBALAMIN) 100 MCG tablet Take 100 mcg by mouth daily.     . methotrexate (RHEUMATREX) 2.5 MG tablet TK 6 TS PO 1 TIME WEEKLY  3   No facility-administered medications prior to visit.     PAST MEDICAL HISTORY: Past Medical History:  Diagnosis Date  . Adrenal adenoma   . 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 Depression   . Diabetes mellitus without complication ([place])   . Hyperlipidemia   . Hypertension   . Hypothyroidism   . Meralgia paresthetica of right side 01/10/2017  . Migraine   . Pericardial effusion   . Ulnar neuropathy at wrist, 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    PAST SURGICAL HISTORY: Past Surgical History:  Procedure Laterality Date  . APPENDECTOMY    . CYST REMOVAL LEG Right    Removed from right thigh joint  . TONSILLECTOMY    . TOTAL VAGINAL HYSTERECTOMY    . UMBILICAL HERNIA REPAIR      FAMILY HISTORY: Family History  Problem Relation Age of Onset  . Hypertension Mother   . Stroke Mother   . Kidney failure Mother   . Heart disease Mother        heart murmur  . Glaucoma Mother   . Stroke Father   . COPD Father   . Cancer - Prostate Father   . Colon cancer Father     SOCIAL HISTORY: Social History   Socioeconomic History  . Marital status: Single    Spouse name: Not on file  . Number of children: 0  . Years of education: Masters  . Highest education  level: Not on file  Social Needs  . Financial resource strain: Not on file  . Food insecurity - worry: Not on file  . Food insecurity - inability: Not on file  . Transportation needs - medical: Not on file  . Transportation needs - non-medical: Not on file  Occupational History  . Occupation: [employer]  Tobacco Use  . Smoking status: Never Smoker  . Smokeless tobacco: Never Used  Substance and Sexual Activity  . Alcohol use: Yes    Comment: occ  . Drug use: No  . Sexual activity: No  Other Topics Concern  . Not on file  Social History Narrative   Lives alone   Caffeine use: 2 cups coffee per day   Tea sometimes   Soda- 3 x/week   Right-handed      PHYSICAL EXAM  Vitals:   12/25/17 0842  Weight: 237 lb 6.4 oz (107.7 kg)  Height: 5' 4.5" (1.638 m)   Body mass index is 40.12 kg/m.  Generalized: Well developed, in no acute distress   Neurological examination  Mentation: Alert oriented to time, place, history taking. Follows all commands speech and language fluent Cranial nerve II-XII: Pupils were equal round reactive to light. Extraocular movements were full, visual field were full on confrontational test. Facial sensation and strength were normal. Uvula tongue midline. Head turning and shoulder shrug  were normal and symmetric. Motor: The motor testing reveals 5 over 5 strength of all 4 extremities. Good symmetric motor tone is noted throughout.  Sensory: Sensory testing is intact to soft touch on all 4 extremities. No evidence of extinction is noted.  Coordination: Cerebellar testing reveals good finger-nose-finger and heel-to-shin bilaterally.  Gait and station: Gait is normal. Tandem gait is normal. Romberg is negative. No drift is seen.  Reflexes: Deep tendon reflexes are symmetric and normal bilaterally.   DIAGNOSTIC DATA (LABS, IMAGING, TESTING) - I reviewed patient records, labs, notes, testing and imaging myself where available.  No results found  for: WBC, HGB, HCT, MCV, PLT    Component Value Date/Time   PROT 6.3 04/29/2017 1325   No results found for: CHOL, HDL, LDLCALC, LDLDIRECT, TRIG, CHOLHDL No results found for: HGBA1C Lab Results  Component Value Date   VITAMINB12 730 01/10/2017   No results found for: TSH    ASSESSMENT AND PLAN 62 y.o. 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 here with:  1.  Obstructive sleep apnea on CPAP  The patient CPAP download shows excellent compliance however her residual AHI remains elevated at 5.8 despite an increase in pressure at the last visit.  The patient has had approximately 5 pound weight gain since the last visit.  We will increase her pressure to 9 cm of water to see if this will further decrease her AHI.  Patient is encouraged to monitor her diet.  She is advised that if her symptoms worsen or she develops new symptoms she should let us know.  She will follow-up in 6 months or sooner if needed.     Ward Givens, MSN, NP-C 12/25/2017, 8:48 AM [facility] [address] [place], [place] [address] [phone]  I reviewed the above note and documentation by the Nurse Practitioner and agree with the history, physical exam, assessment and plan as outlined above. I was immediately available for face-to-face consultation. [name], MD, PhD [facility])

## 2017-12-25 NOTE — Patient Instructions (Addendum)
Your Plan:  Continue using the CPAP nightly. I will increase pressure to 9 cm of water Mask refitting today If your symptoms worsen or you develop new symptoms please let us know.   Thank you for coming to see [place] at [facility]. I hope we have been able to provide you high quality care today.  You may receive a patient satisfaction survey over the next few weeks. We would appreciate your feedback and comments so that we may continue to improve ourselves and the health of our patients.

## 2017-12-25 NOTE — Progress Notes (Signed)
Fax confirmation received cpap order pressure change [facility] [phone]. sy

## 2017-12-25 NOTE — Addendum Note (Signed)
Addended by: Trudie Buckler on: 12/25/2017 01:42 PM   Modules accepted: Orders

## 2017-12-26 NOTE — Addendum Note (Signed)
Addended by: Trudie Buckler on: 12/26/2017 08:02 AM   Modules accepted: Orders

## 2018-01-16 ENCOUNTER — Telehealth: Payer: Self-pay | Admitting: Hematology and Oncology

## 2018-01-16 NOTE — Telephone Encounter (Signed)
Spoke to patient regarding upcoming march appointments per 3/19 sch message.

## 2018-01-17 ENCOUNTER — Other Ambulatory Visit: Payer: Self-pay

## 2018-01-17 DIAGNOSIS — D649 Anemia, unspecified: Secondary | ICD-10-CM

## 2018-01-20 ENCOUNTER — Inpatient Hospital Stay: Payer: BC Managed Care – PPO

## 2018-01-20 ENCOUNTER — Inpatient Hospital Stay: Payer: BC Managed Care – PPO | Attending: Hematology and Oncology | Admitting: Hematology and Oncology

## 2018-01-20 ENCOUNTER — Telehealth: Payer: Self-pay | Admitting: Hematology and Oncology

## 2018-01-20 VITALS — BP 147/67 | HR 86 | Temp 99.1°F | Resp 16 | Ht 64.5 in | Wt 234.0 lb

## 2018-01-20 DIAGNOSIS — D649 Anemia, unspecified: Secondary | ICD-10-CM | POA: Insufficient documentation

## 2018-01-20 DIAGNOSIS — M899 Disorder of bone, unspecified: Secondary | ICD-10-CM | POA: Insufficient documentation

## 2018-01-20 DIAGNOSIS — Z79899 Other long term (current) drug therapy: Secondary | ICD-10-CM | POA: Insufficient documentation

## 2018-01-20 LAB — IRON AND TIBC
Iron: 65 ug/dL (ref 41–142)
Saturation Ratios: 23 % (ref 21–57)
TIBC: 285 ug/dL (ref 236–444)
UIBC: 220 ug/dL

## 2018-01-20 LAB — CBC WITH DIFFERENTIAL (CANCER CENTER ONLY)
Basophils Absolute: 0 10*3/uL (ref 0.0–0.1)
Basophils Relative: 1 %
Eosinophils Absolute: 0.1 10*3/uL (ref 0.0–0.5)
Eosinophils Relative: 2 %
HCT: 30.4 % — ABNORMAL LOW (ref 34.8–46.6)
Hemoglobin: 9.6 g/dL — ABNORMAL LOW (ref 11.6–15.9)
Lymphocytes Relative: 36 %
Lymphs Abs: 1.7 10*3/uL (ref 0.9–3.3)
MCH: 28.2 pg (ref 25.1–34.0)
MCHC: 31.6 g/dL (ref 31.5–36.0)
MCV: 89.2 fL (ref 79.5–101.0)
Monocytes Absolute: 0.3 10*3/uL (ref 0.1–0.9)
Monocytes Relative: 5 %
Neutro Abs: 2.7 10*3/uL (ref 1.5–6.5)
Neutrophils Relative %: 56 %
Platelet Count: 184 10*3/uL (ref 145–400)
RBC: 3.41 MIL/uL — ABNORMAL LOW (ref 3.70–5.45)
RDW: 15.2 % — ABNORMAL HIGH (ref 11.2–14.5)
WBC Count: 4.8 10*3/uL (ref 3.9–10.3)

## 2018-01-20 LAB — RETICULOCYTES
RBC.: 3.57 MIL/uL — ABNORMAL LOW (ref 3.70–5.45)
Retic Count, Absolute: 96.4 10*3/uL — ABNORMAL HIGH (ref 33.7–90.7)
Retic Ct Pct: 2.7 % — ABNORMAL HIGH (ref 0.7–2.1)

## 2018-01-20 LAB — FERRITIN: Ferritin: 88 ng/mL (ref 9–269)

## 2018-01-20 LAB — DIRECT ANTIGLOBULIN TEST (NOT AT ARMC)
DAT, IgG: NEGATIVE
DAT, complement: NEGATIVE

## 2018-01-20 LAB — LACTATE DEHYDROGENASE: LDH: 281 U/L — ABNORMAL HIGH (ref 125–245)

## 2018-01-20 LAB — VITAMIN B12: Vitamin B-12: 750 pg/mL (ref 180–914)

## 2018-01-20 LAB — FOLATE: Folate: 23.5 ng/mL (ref >5.9)

## 2018-01-20 NOTE — Telephone Encounter (Signed)
Gave avs and calendar ° °

## 2018-01-20 NOTE — Progress Notes (Signed)
Patient Care Team: Deland Pretty, MD as PCP - General (Internal Medicine)  DIAGNOSIS:  Encounter Diagnoses  Name Primary?  . Lesion of left humerus   . Normochromic normocytic anemia Yes   CHIEF COMPLIANT: Follow-up to evaluate the cause of normocytic anemia  INTERVAL HISTORY: Jocelyn Kaufman is a 62 year old who had seen previously for a lesion of the left humerus which on biopsy at [facility] came back benign and she is being followed by them for this condition.  I was asked to see her back because of her recent onset of worsening normocytic anemia.  She usually runs a hemoglobin of 10.6.  But over the past 3 months her hemoglobin has declined to 9.6.  Iron studies done over last year as well as recently did not show any evidence of iron deficiency.  She was sent to me for further evaluation and discussion of treatment options. She currently complains of mild to moderate fatigue.  She has had diffuse pains intermittently.  REVIEW OF SYSTEMS:   Constitutional: Denies fevers, chills or abnormal weight loss Eyes: Denies blurriness of vision Ears, nose, mouth, throat, and face: Denies mucositis or sore throat Respiratory: Denies cough, dyspnea or wheezes Cardiovascular: Denies palpitation, chest discomfort Gastrointestinal:  Denies nausea, heartburn or change in bowel habits Skin: Denies abnormal skin rashes Lymphatics: Denies new lymphadenopathy or easy bruising Neurological:Denies numbness, tingling or new weaknesses Behavioral/Psych: Mood is stable, no new changes  Extremities: No lower extremity edema All other systems were reviewed with the patient and are negative.  I have reviewed the past medical history, past surgical history, social history and family history with the patient and they are unchanged from previous note.  ALLERGIES:  is allergic to invokana [canagliflozin]; percocet [oxycodone-acetaminophen]; prilosec [omeprazole]; rogaine [minoxidil]; and victoza  [liraglutide].  MEDICATIONS:  Current Outpatient Medications  Medication Sig Dispense Refill  . albuterol (PROVENTIL HFA;VENTOLIN HFA) 108 (90 Base) MCG/ACT inhaler Inhale two puffs every four to six hours as needed for cough or wheeze. 1 Inhaler 1  . amLODipine (NORVASC) 5 MG tablet Take 5 mg by mouth daily.    . Aspirin-Acetaminophen-Caffeine (EXCEDRIN MIGRAINE PO) Take 2 tablets by mouth as needed.    . Biotin 1 MG CAPS Take 1 % by mouth daily.    . cetirizine (ZYRTEC) 10 MG tablet Take 10 mg by mouth daily.    . Cholecalciferol (VITAMIN D-3) 5000 units TABS Take 5,000 Units by mouth once a week.     . Dulaglutide (TRULICITY) 1.5 ZS/0.1UX SOPN Inject 1.5 mg into the skin once a week.     Marland Kitchen EPINEPHrine (EPIPEN 2-PAK) 0.3 mg/0.3 mL IJ SOAJ injection Use as directed for life-threatening allergic reaction. 2 Device 1  . escitalopram (LEXAPRO) 20 MG tablet Take 20 mg by mouth daily.    . ferrous sulfate 325 (65 FE) MG tablet Take 365 mg by mouth daily.     . folic acid (FOLVITE) 1 MG tablet Take 2 mg by mouth daily.    Marland Kitchen glyBURIDE (DIABETA) 5 MG tablet Take 5 mg by mouth daily with breakfast.    . Golimumab (SIMPONI [place]) Inject into the skin.    . hydrALAZINE (APRESOLINE) 25 MG tablet Take 25 mg by mouth 2 (two) times daily.    Marland Kitchen ibuprofen (ADVIL,MOTRIN) 200 MG tablet Take 200 mg by mouth every 6 (six) hours as needed.    . Insulin Glargine (BASAGLAR KWIKPEN) 100 UNIT/ML SOPN Inject 60-66 Units into the skin daily.     Marland Kitchen  levothyroxine (SYNTHROID) 125 MCG tablet Take 125 mcg by mouth daily before breakfast. BRAND ONLY    . LORazepam (ATIVAN) 0.5 MG tablet Take 0.5 mg by mouth as needed for anxiety.    . Magnesium 500 MG TABS Take 1 tablet by mouth daily.    . metaxalone (SKELAXIN) 800 MG tablet Take 800 mg by mouth 3 (three) times daily as needed for muscle spasms.    . metFORMIN (GLUCOPHAGE-XR) 500 MG 24 hr tablet Take 500 mg by mouth 3 (three) times daily.    . Methotrexate, Anti-Rheumatic,  (METHOTREXATE, PF, [place]) Inject 0.8 Units into the skin once a week.    . Multiple Vitamins-Minerals (CENTRUM SILVER PO) Take 1 tablet by mouth daily.    . TRAVATAN Z 0.004 % SOLN ophthalmic solution Place 0.004 drops into both eyes daily.    . vitamin B-12 (CYANOCOBALAMIN) 100 MCG tablet Take 100 mcg by mouth daily.      No current facility-administered medications for this visit.     PHYSICAL EXAMINATION: ECOG PERFORMANCE STATUS: 1 - Symptomatic but completely ambulatory  Vitals:   01/20/18 0939  BP: (!) 147/67  Pulse: 86  Resp: 16  Temp: 99.1 F (37.3 C)  SpO2: 100%   Filed Weights   01/20/18 0939  Weight: 234 lb (106.1 kg)    GENERAL:alert, no distress and comfortable SKIN: skin color, texture, turgor are normal, no rashes or significant lesions EYES: normal, Conjunctiva are pink and non-injected, sclera clear OROPHARYNX:no exudate, no erythema and lips, buccal mucosa, and tongue normal  NECK: supple, thyroid normal size, non-tender, without nodularity LYMPH:  no palpable lymphadenopathy in the cervical, axillary or inguinal LUNGS: clear to auscultation and percussion with normal breathing effort HEART: regular rate & rhythm and no murmurs and no lower extremity edema ABDOMEN:abdomen soft, non-tender and normal bowel sounds MUSCULOSKELETAL:no cyanosis of digits and no clubbing  NEURO: alert & oriented x 3 with fluent speech, no focal motor/sensory deficits EXTREMITIES: No lower extremity edema  LABORATORY DATA:  I have reviewed the data as listed CMP Latest Ref Rng & Units 04/29/2017 01/10/2017  Total Protein 6.0 - 8.5 g/dL 6.3 6.3    Lab Results  Component Value Date   WBC 4.8 01/20/2018   HCT 30.4 (L) 01/20/2018   MCV 89.2 01/20/2018   PLT 184 01/20/2018   NEUTROABS 2.7 01/20/2018    ASSESSMENT & PLAN:  Lesion of left humerus PET/CT scan 02/18/2017: Abnormal PET positive uptake in the left humeral head SUV 4.9, bilateral atrial hyperplasia MRI left humerus  04/13/2017: 3.5 a 3.1 cm metaphyseal lesion in 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    Normochromic normocytic anemia Patient's labs were reviewed Hemoglobin is 9.6 MCV normal Hemoglobin November 2018: 10.6 Patient has history of hypothyroidism as well as possible adrenal dysfunction issues for which she goes to [facility]  Differential diagnosis: 1. Anemia due to chronic disease and inflammation 2.  combined B-12 and iron deficiency anemias (previous blood work has not revealed definite iron deficiency anemia) 4. Hemolysis: Will check have to LDH reticulocyte count and direct Coombs 5. Hypothyroidism: On thyroid replacement therapy 6. Plasma cell disorders myeloma 7. Bone marrow dysfunction  Workup performed: 1. CBC with differential to evaluate the smear 2. Haptoglobin, LDH, reticulocyte count to evaluate hemolysis 3. SPEP 4. H-73 and folic acid levels 5. Erythropoietin   return to clinic in 1 week to review the results of the blood work     I spent 25 minutes talking to the  patient of which more than half was spent in counseling and coordination of care.  Orders Placed This Encounter  Procedures  . Vitamin B12    Standing Status:   Future    Number of Occurrences:   1    Standing Expiration Date:   01/20/2019  . Folate, Serum    Standing Status:   Future    Number of Occurrences:   1    Standing Expiration Date:   01/20/2019  . Multiple Myeloma Panel (SPEP&IFE w/QIG)    Standing Status:   Future    Number of Occurrences:   1    Standing Expiration Date:   02/24/2019  . Haptoglobin    Standing Status:   Future    Number of Occurrences:   1    Standing Expiration Date:   01/20/2019  . Lactate dehydrogenase (LDH)    Standing Status:   Future    Number of Occurrences:   1    Standing Expiration Date:   01/20/2019  . Reticulocytes    Standing Status:   Future    Number of Occurrences:   1    Standing Expiration Date:   01/21/2019  . Erythropoietin     Standing Status:   Future    Number of Occurrences:   1    Standing Expiration Date:   01/20/2019  . Direct antiglobulin test (not at [facility])    Standing Status:   Future    Number of Occurrences:   1    Standing Expiration Date:   02/24/2019   The patient has a good understanding of the overall plan. she agrees with it. she will call with any problems that may develop before the next visit here.   Viinay K , MD 01/20/18    

## 2018-01-20 NOTE — Assessment & Plan Note (Signed)
Patient's labs were reviewed Hemoglobin is 9.6 MCV normal Hemoglobin November 2018: 10.6 Patient has history of hypothyroidism as well as possible adrenal dysfunction issues for which she goes to [facility]  Differential diagnosis: 1. Anemia due to chronic disease and inflammation 2.  combined B-12 and iron deficiency anemias (previous blood work has not revealed definite iron deficiency anemia) 4. Hemolysis: Will check have to LDH reticulocyte count and direct Coombs 5. Hypothyroidism: On thyroid replacement therapy 6. Plasma cell disorders myeloma 7. Bone marrow dysfunction  Workup performed: 1. CBC with differential to evaluate the smear 2. Haptoglobin, LDH, reticulocyte count to evaluate hemolysis 3. SPEP 4. U-98 and folic acid levels 5. Erythropoietin   return to clinic in 1 week to review the results of the blood work

## 2018-01-20 NOTE — Assessment & Plan Note (Signed)
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

## 2018-01-21 LAB — ERYTHROPOIETIN: Erythropoietin: 24.2 m[IU]/mL — ABNORMAL HIGH (ref 2.6–18.5)

## 2018-01-21 LAB — HAPTOGLOBIN: Haptoglobin: 146 mg/dL (ref 34–200)

## 2018-01-22 LAB — MULTIPLE MYELOMA PANEL, SERUM
Albumin SerPl Elph-Mcnc: 3.7 g/dL (ref 2.9–4.4)
Albumin/Glob SerPl: 1.4 (ref 0.7–1.7)
Alpha 1: 0.2 g/dL (ref 0.0–0.4)
Alpha2 Glob SerPl Elph-Mcnc: 0.9 g/dL (ref 0.4–1.0)
B-Globulin SerPl Elph-Mcnc: 1.1 g/dL (ref 0.7–1.3)
Gamma Glob SerPl Elph-Mcnc: 0.6 g/dL (ref 0.4–1.8)
Globulin, Total: 2.8 g/dL (ref 2.2–3.9)
IgA: 59 mg/dL — ABNORMAL LOW (ref 87–352)
IgG (Immunoglobin G), Serum: 640 mg/dL — ABNORMAL LOW (ref 700–1600)
IgM (Immunoglobulin M), Srm: 65 mg/dL (ref 26–217)
Total Protein ELP: 6.5 g/dL (ref 6.0–8.5)

## 2018-01-23 ENCOUNTER — Ambulatory Visit (INDEPENDENT_AMBULATORY_CARE_PROVIDER_SITE_OTHER): Payer: BC Managed Care – PPO | Admitting: *Deleted

## 2018-01-23 DIAGNOSIS — J309 Allergic rhinitis, unspecified: Secondary | ICD-10-CM

## 2018-01-27 ENCOUNTER — Telehealth: Payer: Self-pay | Admitting: Hematology and Oncology

## 2018-01-27 ENCOUNTER — Inpatient Hospital Stay: Payer: BC Managed Care – PPO | Attending: Hematology and Oncology | Admitting: Hematology and Oncology

## 2018-01-27 DIAGNOSIS — D649 Anemia, unspecified: Secondary | ICD-10-CM

## 2018-01-27 DIAGNOSIS — Z79899 Other long term (current) drug therapy: Secondary | ICD-10-CM | POA: Diagnosis not present

## 2018-01-27 DIAGNOSIS — E039 Hypothyroidism, unspecified: Secondary | ICD-10-CM | POA: Insufficient documentation

## 2018-01-27 NOTE — Telephone Encounter (Signed)
Appointments scheduled AVS/Calendar printed per 4/1 los °

## 2018-01-27 NOTE — Assessment & Plan Note (Signed)
Hemoglobin is 9.6 MCV normal Hemoglobin November 2018: 10.6 Patient has history of hypothyroidism as well as possible adrenal dysfunction issues for which she goes to [facility]  Blood work review: 1.  Erythropoietin: 24.2 2. SPEP: No M protein 3.  Haptoglobin: 146, LDH 281, absolute reticulocyte count 96.4, direct Coombs test negative 4.  B12 750  5.  Folic acid 41.4 6.  Ferritin 88, iron saturation 25 7. Hemoglobin 9.6 with MCV 89  Interpretation: Patient appears to have an increase in the absolute reticulocyte count suggesting adequate bone marrow production.  There is no evidence of E39 or folic acid deficiencies or hemolysis.  Serum protein electrophoresis was also normal.  Erythropoietin is slightly decreased.  Possible etiology: Cannot rule out anemia of chronic disease which includes hypothyroidism and adrenal issues. It is also possible that it could be related to methotrexate. I discussed with her that we could perform bone marrow biopsy to make sure there is no other bone marrow dysfunction disorders.  The other option would be to continue to watch and monitor since she is relatively asymptomatic.

## 2018-01-27 NOTE — Progress Notes (Signed)
Patient Care Team: Deland Pretty, MD as PCP - General (Internal Medicine)  DIAGNOSIS:  Encounter Diagnosis  Name Primary?  . Normochromic normocytic anemia     CHIEF COMPLIANT: Follow-up of normocytic anemia  INTERVAL HISTORY: Jocelyn Kaufman is a 62 year old with above-mentioned history of normocytic anemia who had blood work done last week and is here today to discuss the results.  She has chronic fatigue but no other symptoms or concerns.  REVIEW OF SYSTEMS:   Constitutional: Denies fevers, chills or abnormal weight loss Eyes: Denies blurriness of vision Ears, nose, mouth, throat, and face: Denies mucositis or sore throat Respiratory: Denies cough, dyspnea or wheezes Cardiovascular: Denies palpitation, chest discomfort Gastrointestinal:  Denies nausea, heartburn or change in bowel habits Skin: Denies abnormal skin rashes Lymphatics: Denies new lymphadenopathy or easy bruising Neurological:Denies numbness, tingling or new weaknesses Behavioral/Psych: Mood is stable, no new changes  Extremities: No lower extremity edema  All other systems were reviewed with the patient and are negative.  I have reviewed the past medical history, past surgical history, social history and family history with the patient and they are unchanged from previous note.  ALLERGIES:  is allergic to invokana [canagliflozin]; percocet [oxycodone-acetaminophen]; prilosec [omeprazole]; rogaine [minoxidil]; and victoza [liraglutide].  MEDICATIONS:  Current Outpatient Medications  Medication Sig Dispense Refill  . albuterol (PROVENTIL HFA;VENTOLIN HFA) 108 (90 Base) MCG/ACT inhaler Inhale two puffs every four to six hours as needed for cough or wheeze. 1 Inhaler 1  . amLODipine (NORVASC) 5 MG tablet Take 5 mg by mouth daily.    . Aspirin-Acetaminophen-Caffeine (EXCEDRIN MIGRAINE PO) Take 2 tablets by mouth as needed.    . Biotin 1 MG CAPS Take 1 % by mouth daily.    . cetirizine (ZYRTEC) 10 MG tablet Take 10  mg by mouth daily.    . Cholecalciferol (VITAMIN D-3) 5000 units TABS Take 5,000 Units by mouth once a week.     . Dulaglutide (TRULICITY) 1.5 YQ/6.5HQ SOPN Inject 1.5 mg into the skin once a week.     Marland Kitchen EPINEPHrine (EPIPEN 2-PAK) 0.3 mg/0.3 mL IJ SOAJ injection Use as directed for life-threatening allergic reaction. 2 Device 1  . escitalopram (LEXAPRO) 20 MG tablet Take 20 mg by mouth daily.    . ferrous sulfate 325 (65 FE) MG tablet Take 365 mg by mouth daily.     . folic acid (FOLVITE) 1 MG tablet Take 2 mg by mouth daily.    Marland Kitchen glyBURIDE (DIABETA) 5 MG tablet Take 5 mg by mouth daily with breakfast.    . Golimumab (SIMPONI [place]) Inject into the skin.    . hydrALAZINE (APRESOLINE) 25 MG tablet Take 25 mg by mouth 2 (two) times daily.    Marland Kitchen ibuprofen (ADVIL,MOTRIN) 200 MG tablet Take 200 mg by mouth every 6 (six) hours as needed.    . Insulin Glargine (BASAGLAR KWIKPEN) 100 UNIT/ML SOPN Inject 60-66 Units into the skin daily.     Marland Kitchen levothyroxine (SYNTHROID) 125 MCG tablet Take 125 mcg by mouth daily before breakfast. BRAND ONLY    . LORazepam (ATIVAN) 0.5 MG tablet Take 0.5 mg by mouth as needed for anxiety.    . Magnesium 500 MG TABS Take 1 tablet by mouth daily.    . metaxalone (SKELAXIN) 800 MG tablet Take 800 mg by mouth 3 (three) times daily as needed for muscle spasms.    . metFORMIN (GLUCOPHAGE-XR) 500 MG 24 hr tablet Take 500 mg by mouth 3 (three) times daily.    Marland Kitchen  Methotrexate, Anti-Rheumatic, (METHOTREXATE, PF, [place]) Inject 0.8 Units into the skin once a week.    . Multiple Vitamins-Minerals (CENTRUM SILVER PO) Take 1 tablet by mouth daily.    . TRAVATAN Z 0.004 % SOLN ophthalmic solution Place 0.004 drops into both eyes daily.    . vitamin B-12 (CYANOCOBALAMIN) 100 MCG tablet Take 100 mcg by mouth daily.      No current facility-administered medications for this visit.     PHYSICAL EXAMINATION: ECOG PERFORMANCE STATUS: 1 - Symptomatic but completely ambulatory  Vitals:   01/27/18  0818  BP: 140/64  Pulse: 75  Resp: 17  Temp: 97.9 F (36.6 C)  SpO2: 100%   Filed Weights   01/27/18 0818  Weight: 232 lb 6.4 oz (105.4 kg)    GENERAL:alert, no distress and comfortable SKIN: skin color, texture, turgor are normal, no rashes or significant lesions EYES: normal, Conjunctiva are pink and non-injected, sclera clear OROPHARYNX:no exudate, no erythema and lips, buccal mucosa, and tongue normal  NECK: supple, thyroid normal size, non-tender, without nodularity LYMPH:  no palpable lymphadenopathy in the cervical, axillary or inguinal LUNGS: clear to auscultation and percussion with normal breathing effort HEART: regular rate & rhythm and no murmurs and no lower extremity edema ABDOMEN:abdomen soft, non-tender and normal bowel sounds MUSCULOSKELETAL:no cyanosis of digits and no clubbing  NEURO: alert & oriented x 3 with fluent speech, no focal motor/sensory deficits EXTREMITIES: No lower extremity edema  LABORATORY DATA:  I have reviewed the data as listed CMP Latest Ref Rng & Units 04/29/2017 01/10/2017  Total Protein 6.0 - 8.5 g/dL 6.3 6.3    Lab Results  Component Value Date   WBC 4.8 01/20/2018   HCT 30.4 (L) 01/20/2018   MCV 89.2 01/20/2018   PLT 184 01/20/2018   NEUTROABS 2.7 01/20/2018    ASSESSMENT & PLAN:  Normochromic normocytic anemia Hemoglobin is 9.6 MCV normal Hemoglobin November 2018: 10.6 Patient has history of hypothyroidism as well as possible adrenal dysfunction issues for which she goes to [facility]  Blood work review: 1.  Erythropoietin: 24.2 2. SPEP: No M protein 3.  Haptoglobin: 146, LDH 281, absolute reticulocyte count 96.4, direct Coombs test negative 4.  B12 750  5.  Folic acid 94.7 6.  Ferritin 88, iron saturation 25 7. Hemoglobin 9.6 with MCV 89  Interpretation: Patient appears to have an increase in the absolute reticulocyte count suggesting adequate bone marrow production.  There is no evidence of M54 or folic acid deficiencies or  hemolysis.  Serum protein electrophoresis was also normal.  Erythropoietin is slightly decreased.  Possible etiology: Cannot rule out anemia of chronic disease which includes hypothyroidism and adrenal issues. It is also possible that it could be related to methotrexate. I discussed with her that we could perform bone marrow biopsy to make sure there is no other bone marrow dysfunction disorders.  The other option would be to continue to watch and monitor since she is relatively asymptomatic.  Patient elected to watch and monitor for now.  I will see the patient back in 6 months for follow-up.  She will have blood work with her endocrinologist in June.  Orders Placed This Encounter  Procedures  . Reticulocytes    Standing Status:   Future    Standing Expiration Date:   01/28/2019  . CBC with Differential ([facility] Center Only)    Standing Status:   Future    Standing Expiration Date:   01/28/2019   The patient has a good understanding of  the overall plan. she agrees with it. she will call with any problems that may develop before the next visit here.   Harriette Ohara, MD 01/27/18

## 2018-02-13 ENCOUNTER — Telehealth: Payer: Self-pay | Admitting: Hematology and Oncology

## 2018-02-13 NOTE — Telephone Encounter (Signed)
Left voicemail for patient informing Disability forms have been successfully faxed to [place] at [phone]. Copy has been mailed to patient address on file.

## 2018-02-20 ENCOUNTER — Ambulatory Visit (INDEPENDENT_AMBULATORY_CARE_PROVIDER_SITE_OTHER): Payer: BC Managed Care – PPO | Admitting: *Deleted

## 2018-02-20 DIAGNOSIS — J309 Allergic rhinitis, unspecified: Secondary | ICD-10-CM

## 2018-02-28 ENCOUNTER — Ambulatory Visit (INDEPENDENT_AMBULATORY_CARE_PROVIDER_SITE_OTHER): Payer: BC Managed Care – PPO

## 2018-02-28 DIAGNOSIS — J309 Allergic rhinitis, unspecified: Secondary | ICD-10-CM

## 2018-03-07 ENCOUNTER — Other Ambulatory Visit: Payer: Self-pay | Admitting: Internal Medicine

## 2018-03-07 DIAGNOSIS — M5441 Lumbago with sciatica, right side: Secondary | ICD-10-CM

## 2018-03-07 DIAGNOSIS — M4800 Spinal stenosis, site unspecified: Secondary | ICD-10-CM

## 2018-03-08 ENCOUNTER — Ambulatory Visit
Admission: RE | Admit: 2018-03-08 | Discharge: 2018-03-08 | Disposition: A | Discharge status: Home / Self Care | Payer: BC Managed Care – PPO | Source: Ambulatory Visit | Attending: Internal Medicine | Admitting: Internal Medicine

## 2018-03-08 DIAGNOSIS — M5441 Lumbago with sciatica, right side: Secondary | ICD-10-CM

## 2018-03-08 DIAGNOSIS — M4800 Spinal stenosis, site unspecified: Secondary | ICD-10-CM

## 2018-03-10 ENCOUNTER — Ambulatory Visit (INDEPENDENT_AMBULATORY_CARE_PROVIDER_SITE_OTHER): Payer: BC Managed Care – PPO | Admitting: *Deleted

## 2018-03-10 DIAGNOSIS — J309 Allergic rhinitis, unspecified: Secondary | ICD-10-CM | POA: Diagnosis not present

## 2018-03-18 ENCOUNTER — Ambulatory Visit (INDEPENDENT_AMBULATORY_CARE_PROVIDER_SITE_OTHER): Payer: BC Managed Care – PPO | Admitting: *Deleted

## 2018-03-18 DIAGNOSIS — J309 Allergic rhinitis, unspecified: Secondary | ICD-10-CM | POA: Diagnosis not present

## 2018-03-25 ENCOUNTER — Ambulatory Visit (INDEPENDENT_AMBULATORY_CARE_PROVIDER_SITE_OTHER): Payer: BC Managed Care – PPO | Admitting: *Deleted

## 2018-03-25 DIAGNOSIS — J309 Allergic rhinitis, unspecified: Secondary | ICD-10-CM

## 2018-04-25 ENCOUNTER — Ambulatory Visit (INDEPENDENT_AMBULATORY_CARE_PROVIDER_SITE_OTHER): Payer: BC Managed Care – PPO

## 2018-04-25 DIAGNOSIS — J309 Allergic rhinitis, unspecified: Secondary | ICD-10-CM

## 2018-05-02 ENCOUNTER — Other Ambulatory Visit: Payer: Self-pay

## 2018-05-02 ENCOUNTER — Encounter (INDEPENDENT_AMBULATORY_CARE_PROVIDER_SITE_OTHER): Payer: Self-pay

## 2018-05-02 ENCOUNTER — Ambulatory Visit (HOSPITAL_COMMUNITY): Payer: BC Managed Care – PPO | Attending: Cardiovascular Disease

## 2018-05-02 DIAGNOSIS — E669 Obesity, unspecified: Secondary | ICD-10-CM | POA: Insufficient documentation

## 2018-05-02 DIAGNOSIS — I071 Rheumatic tricuspid insufficiency: Secondary | ICD-10-CM | POA: Insufficient documentation

## 2018-05-02 DIAGNOSIS — E119 Type 2 diabetes mellitus without complications: Secondary | ICD-10-CM | POA: Diagnosis not present

## 2018-05-02 DIAGNOSIS — I371 Nonrheumatic pulmonary valve insufficiency: Secondary | ICD-10-CM | POA: Insufficient documentation

## 2018-05-02 DIAGNOSIS — I10 Essential (primary) hypertension: Secondary | ICD-10-CM | POA: Insufficient documentation

## 2018-05-02 DIAGNOSIS — I313 Pericardial effusion (noninflammatory): Secondary | ICD-10-CM | POA: Insufficient documentation

## 2018-05-02 DIAGNOSIS — I3139 Other pericardial effusion (noninflammatory): Secondary | ICD-10-CM

## 2018-05-26 ENCOUNTER — Ambulatory Visit (INDEPENDENT_AMBULATORY_CARE_PROVIDER_SITE_OTHER): Payer: BC Managed Care – PPO

## 2018-05-26 DIAGNOSIS — J309 Allergic rhinitis, unspecified: Secondary | ICD-10-CM

## 2018-06-27 ENCOUNTER — Encounter: Payer: Self-pay | Admitting: Internal Medicine

## 2018-06-27 ENCOUNTER — Ambulatory Visit: Payer: BC Managed Care – PPO | Admitting: Internal Medicine

## 2018-06-27 ENCOUNTER — Ambulatory Visit (INDEPENDENT_AMBULATORY_CARE_PROVIDER_SITE_OTHER): Payer: BC Managed Care – PPO

## 2018-06-27 VITALS — BP 134/56 | HR 74 | Ht 64.5 in | Wt 233.4 lb

## 2018-06-27 DIAGNOSIS — M069 Rheumatoid arthritis, unspecified: Secondary | ICD-10-CM | POA: Diagnosis not present

## 2018-06-27 DIAGNOSIS — J309 Allergic rhinitis, unspecified: Secondary | ICD-10-CM

## 2018-06-27 DIAGNOSIS — I10 Essential (primary) hypertension: Secondary | ICD-10-CM

## 2018-06-27 DIAGNOSIS — I313 Pericardial effusion (noninflammatory): Secondary | ICD-10-CM | POA: Diagnosis not present

## 2018-06-27 DIAGNOSIS — I3139 Other pericardial effusion (noninflammatory): Secondary | ICD-10-CM

## 2018-06-27 NOTE — Patient Instructions (Signed)
Your physician recommends that you schedule a follow-up appointment as needed with Dr. Hilty.  

## 2018-06-27 NOTE — Progress Notes (Signed)
OFFICE CONSULT NOTE  Chief Complaint:  Follow-up pericardial effusion  Primary Care Physician: Deland Pretty, MD  HPI:  Jocelyn Kaufman is a 62 y.o. female who is being seen today for the evaluation of intermittent chest pressure at the request of Deland Pretty, MD. Jocelyn Kaufman is a 62 year old female with a history of type 2 diabetes, depression, hyperlipidemia, hypertension, left ulnar neuropathy, possible obstructive sleep apnea, and recent extensive workup at the [facility] for possible Cushing's disease. Per their report it does not seem that she has this although she was noted to have bilateral adrenal hyperplasia. I first met Jocelyn Kaufman in 2012 which time she was hospitalized for chest pain and underwent a heart catheterization after an abnormal nuclear stress test, which I performed demonstrating normal coronary arteries. As part of her workup at the [facility] she underwent an echocardiogram. I reviewed the echocardiogram report but did not have the images to directly review. This demonstrated an LVEF of 55%, no regional wall motion abnormalities, mild left atrial enlargement, and a small, hemodynamically significant pericardial effusion. RVSP was normal at 32 mmHg. The study was performed on 02/11/2017. She says that over the past week or 2 she's had some more intermittent chest pressure. This is substernal and does not radiate. It's not worse with exertion or relieved by rest. The etiology of her pericardial effusion is not clear however NIH suggested a workup for immune disorders. When I asked her about family history, she was somewhat unclear about whether autoimmune disorders were in her family. She also inquired today for me to fill out paperwork supporting disability. She is a 6 grade teacher. I reiterated that I did not believe that at this point she hasn't developed disabling cardiovascular condition, although she has multiple medical problems which are currently being addressed by  her primary care provider.  04/30/2017  Jocelyn Kaufman returns today for follow-up. As per our previous plan she underwent a repeat echocardiogram to evaluate her small pericardial effusion. The study on 03/28/2017 showed an EF of 83-15%, grade 2 diastolic dysfunction and a small to moderate sized pericardial effusion without tamponade features. We attempted to contact the patient on 03/29/2017 and 04/01/2017 but received a smart call blocker response. She was then notified of results and a copy of the echo report was left per her request at the front desk (see notes attached to the echo dated 03/28/2017). Based on those findings and the acute change in size of the pericardial effusion, I recommended a repeat echo in one month to rule out any worsening pericardial effusion. She expressed interest in following up after that limited echo. A limited echo to evaluate the pericardial effusion was performed yesterday on 04/29/2017. I personally reviewed the study and EF has improved to 60-65%, again grade 2 diastolic dysfunction is noted with a much smaller pericardial effusion. There was no evidence of tamponade physiology. When we initially spoke she reported that she had not received any information about the studies and was unclear why she needed to echoes. She seems somewhat frustrated and overwhelmed with the myriad of doctors that she's been seeing. Recently she was seen by Dr. Kathlene November, a rheumatologist, who diagnosed her with rheumatoid arthritis. She's been started on methotrexate. She says that she has not noticed a significant difference in swelling of her hands, however it may be improving her pericardial effusion. She does not report any significant persistent chest discomfort or shortness of breath, rather occasional episodes.  10/17/2017  Jocelyn Kaufman  returns today for follow-up.  It has been 6 months since I last saw her.  Her recent echo in July 2018 showed a slightly higher EF of 60-65% with a small pericardial  effusion which was improved.  Overall she feels much better.  She has started on a medication called Simponi for her RA in addition to methotrexate.  She is only had one dose but she responded well to it.  She denies any further chest heaviness, pressure pain or shortness of breath.  06/27/2018  Jocelyn Kaufman is seen today in follow-up.  She underwent a limited echo on 05/02/2018 which showed a small, mostly posterior pericardial effusion which is essentially unchanged.  LVEF was normal at 60 to 65%.  Her echoes have been stable compared to studies back in 2018 and the degree of effusion is much less than was originally identified when she underwent evaluation at the [facility].  This is likely related to RA.  Subsequently she has been diagnosed with fibromyalgia.  She is also on insulin and Jardiance for control of blood sugars.  In general she says she feels much better than she had in the past.  PMHx:  Past Medical History:  Diagnosis Date  . Adrenal adenoma   . 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 Depression   . Diabetes mellitus without complication ([place])   . Hyperlipidemia   . Hypertension   . Hypothyroidism   . Meralgia paresthetica of right side 01/10/2017  . Migraine   . Pericardial effusion   . 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 APPENDECTOMY    . CYST REMOVAL LEG Right    Removed from right thigh joint  . TONSILLECTOMY    . TOTAL VAGINAL HYSTERECTOMY    . UMBILICAL HERNIA REPAIR      FAMHx:  Family History  Problem Relation Age of Onset  . Hypertension Mother   . Stroke Mother   . Kidney failure Mother   . Heart disease Mother        heart murmur  . Glaucoma Mother   . Stroke Father   . COPD Father   . Cancer - Prostate Father   . Colon cancer Father     SOCHx:   reports that she has never smoked. She has never used smokeless tobacco. She reports that she drinks alcohol. She reports that she does not use  drugs.  ALLERGIES:  Allergies  Allergen Reactions  . Invokana [Canagliflozin] Other (See Comments)    Urinary incontinence  . Percocet [Oxycodone-Acetaminophen] Nausea And Vomiting  . Prilosec [Omeprazole] Rash  . Rogaine [Minoxidil] Rash  . Victoza [Liraglutide] Itching    ROS: Pertinent items noted in HPI and remainder of comprehensive ROS otherwise negative.  HOME MEDS: Current Outpatient Medications on File Prior to Visit  Medication Sig Dispense Refill  . albuterol (PROVENTIL HFA;VENTOLIN HFA) 108 (90 Base) MCG/ACT inhaler Inhale two puffs every four to six hours as needed for cough or wheeze. 1 Inhaler 1  . amLODipine (NORVASC) 5 MG tablet Take 5 mg by mouth daily.    . Aspirin-Acetaminophen-Caffeine (EXCEDRIN MIGRAINE PO) Take 2 tablets by mouth as needed.    . Biotin 1 MG CAPS Take 1 % by mouth daily.    . cetirizine (ZYRTEC) 10 MG tablet Take 10 mg by mouth daily.    . Cholecalciferol (VITAMIN D-3 PO) Take 50,000 Units by mouth once a week.     . Dulaglutide (TRULICITY)  1.5 MG/0.5ML SOPN Inject 1.5 mg into the skin once a week.     . empagliflozin (JARDIANCE) 25 MG TABS tablet Take 1 tablet by mouth daily.    Marland Kitchen EPINEPHrine (EPIPEN 2-PAK) 0.3 mg/0.3 mL IJ SOAJ injection Use as directed for life-threatening allergic reaction. 2 Device 1  . escitalopram (LEXAPRO) 20 MG tablet Take 20 mg by mouth daily.    . ferrous sulfate 325 (65 FE) MG tablet Take 365 mg by mouth daily.     . folic acid (FOLVITE) 1 MG tablet Take 2 mg by mouth daily.    . Golimumab (SIMPONI Vand[place]) Inject into the skin.    . hydrALAZINE (APRESOLINE) 25 MG tablet Take 25 mg by mouth 2 (two) times daily.    Marland Kitchen ibuprofen (ADVIL,MOTRIN) 200 MG tablet Take 200 mg by mouth every 6 (six) hours as needed.    . Insulin Glargine (BASAGLAR KWIKPEN) 100 UNIT/ML SOPN Inject 60-66 Units into the skin daily.     . irbesartan-hydrochlorothiazide (AVALIDE) 300-12.5 MG tablet Take 1 tablet by mouth daily.    Marland Kitchen levothyroxine  (SYNTHROID) 125 MCG tablet Take 125 mcg by mouth daily before breakfast. BRAND ONLY    . LORazepam (ATIVAN) 0.5 MG tablet Take 0.5 mg by mouth as needed for anxiety.    . Magnesium 500 MG TABS Take 1 tablet by mouth daily.    . metaxalone (SKELAXIN) 800 MG tablet Take 800 mg by mouth 3 (three) times daily as needed for muscle spasms.    . metFORMIN (GLUCOPHAGE-XR) 500 MG 24 hr tablet Take 500 mg by mouth 3 (three) times daily.    . Methotrexate, Anti-Rheumatic, (METHOTREXATE, PF, [place]) Inject 0.8 Units into the skin once a week.    . Multiple Vitamins-Minerals (CENTRUM SILVER PO) Take 1 tablet by mouth daily.    . TRAVATAN Z 0.004 % SOLN ophthalmic solution Place 0.004 drops into both eyes daily.    . vitamin B-12 (CYANOCOBALAMIN) 100 MCG tablet Take 100 mcg by mouth daily.      No current facility-administered medications on file prior to visit.     LABS/IMAGING: No results found for this or any previous visit (from the past 48 hour(s)). No results found.  LIPID PANEL: No results found for: CHOL, TRIG, HDL, CHOLHDL, VLDL, LDLCALC, LDLDIRECT  WEIGHTS: Wt Readings from Last 3 Encounters:  06/27/18 233 lb 6.4 oz (105.9 kg)  01/27/18 232 lb 6.4 oz (105.4 kg)  01/20/18 234 lb (106.1 kg)    VITALS: BP (!) 134/56   Pulse 74   Ht 5' 4.5" (1.638 m)   Wt 233 lb 6.4 oz (105.9 kg)   BMI 39.44 kg/m   EXAM: General appearance: alert, no distress and morbidly obese Neck: no carotid bruit, no JVD and thyroid not enlarged, symmetric, no tenderness/mass/nodules Lungs: clear to auscultation bilaterally Heart: regular rate and rhythm Abdomen: soft, non-tender; bowel sounds normal; no masses,  no organomegaly Extremities: extremities normal, atraumatic, no cyanosis or edema Pulses: 2+ and symmetric Skin: Skin color, texture, turgor normal. No rashes or lesions Neurologic: Grossly normal Psych: Pleasant  EKG: Normal sinus rhythm 74 -only reviewed  ASSESSMENT: 1. Pericardial effusion  -small, stable, likely related to rheumatoid arthritis 2. Newly diagnosed rheumatoid arthritis 3. Fibromyalgia 4. History of false positive Myoview in 2012 with normal coronaries by catheterization 5. Bilateral adrenal hyperplasia without Cushing's disease (per NIH work-up) 6. Dependent diabetes  PLAN: 1.   Jocelyn Kaufman has had a stable, small mostly posterior pericardial effusion which is  essentially unchanged from studies in 2018.  This is likely related to rheumatoid arthritis which is being treated.  She is also been diagnosed with fibromyalgia and has insulin-dependent diabetes.  Overall she says she feels much better with ongoing treatment.  I do not think she needs ongoing clinical follow-up of her pericardial effusion.  Should it change in size or she would become symptomatic with it, I am happy to see her back, otherwise she can follow-up with me as needed.  Thanks for allowing me to participate in her care.  Pixie Casino, MD, [facility], [place] Director of the [facility] Lipid Disorders &  Cardiovascular [facility] Clinic Diplomate of the American Board of Clinical Lipidology Attending Cardiologist  Direct Dial: [phone]  Fax: [phone]  Website:  [email or web address] 06/27/2018, 9:38 AM

## 2018-07-04 ENCOUNTER — Ambulatory Visit: Payer: BC Managed Care – PPO | Admitting: Family Medicine

## 2018-07-04 ENCOUNTER — Encounter: Payer: Self-pay | Admitting: Family Medicine

## 2018-07-04 VITALS — BP 130/50 | HR 80 | Temp 98.3°F | Resp 20 | Ht 63.6 in | Wt 230.2 lb

## 2018-07-04 DIAGNOSIS — J4520 Mild intermittent asthma, uncomplicated: Secondary | ICD-10-CM | POA: Diagnosis not present

## 2018-07-04 DIAGNOSIS — J309 Allergic rhinitis, unspecified: Secondary | ICD-10-CM | POA: Diagnosis not present

## 2018-07-04 MED ORDER — EPINEPHRINE 0.3 MG/0.3ML IJ SOAJ: 0.3000 mg | Freq: Once | INTRAMUSCULAR | 2 refills | Status: AC

## 2018-07-04 MED ORDER — ALBUTEROL SULFATE HFA 108 (90 BASE) MCG/ACT IN AERS: INHALATION_SPRAY | RESPIRATORY_TRACT | 1 refills | Status: DC

## 2018-07-04 NOTE — Progress Notes (Signed)
[address] [place] [address] Dept: [phone]  FOLLOW UP NOTE  Patient ID: EMRYN FLANERY, female    DOB: [birth year]  Age: 62 y.o. MRN: [id number] Date of Office Visit: 07/04/2018  Assessment  Chief Complaint: Follow-up (review for asthma inhaler)  HPI RANEY ANTWINE is a 62 year old female who presents to the clinic for a follow up visit. She was last seen in this clinic on 08/21/2016 by Dr. Neldon Mc for evaluation of allergic rhinitis and intermittent asthma. At that time, she continued allergen immunotherapy directed toward grass pollen, weed pollen, tree pollen, and dust mite.   At today's visit, she reports her asthma has been well controlled with the exception of an occasional coughing attack which is usually triggered by an odor. Some of her triggers include cigarette smoke, perfume, and automobile exhaust. She uses albuterol via inhaler at the onset of the attack with relief of symptoms. Other than during an attack, she denies shortness of breath and wheeze.   Allergic rhinitis is reported as well controlled with symptoms including nasal congestion and runny nose. She uses cetirizine once a day. She recently began using a CPAP and reports an increase in morning nasal congestion. She has used nasal saline rinses in the past with relief of symptoms. She continues allergen immunotherapy once a month from the red vial with no adverse reactions reported.   Her current medications are listed in the chart.    Drug Allergies:  Allergies  Allergen Reactions  . Invokana [Canagliflozin] Other (See Comments)    Urinary incontinence  . Percocet [Oxycodone-Acetaminophen] Nausea And Vomiting  . Prilosec [Omeprazole] Rash  . Rogaine [Minoxidil] Rash  . Victoza [Liraglutide] Itching    Physical Exam: BP (!) 130/50   Pulse 80   Temp 98.3 F (36.8 C) (Oral)   Resp 20   Ht 5' 3.6" (1.615 m)   Wt 230 lb 3.2 oz (104.4 kg)   SpO2 97%   BMI 40.01 kg/m    Physical Exam    Constitutional: She is oriented to person, place, and time. She appears well-developed and well-nourished.  HENT:  Head: Normocephalic.  Right Ear: External ear normal.  Left Ear: External ear normal.  Mouth/Throat: Oropharynx is clear and moist.  Bilateral nares slightly erythematous and edematous with no nasal drainage noted. Pharynx normal. Ears normal. Eyes normal.  Eyes: Conjunctivae are normal.  Neck: Normal range of motion. Neck supple.  Cardiovascular: Normal rate, regular rhythm and normal heart sounds.  No murmur noted  Pulmonary/Chest: Effort normal and breath sounds normal.  Lungs clear to auscultation  Musculoskeletal: Normal range of motion.  Neurological: She is alert and oriented to person, place, and time.  Skin: Skin is warm and dry.  Psychiatric: She has a normal mood and affect. Her behavior is normal. Judgment and thought content normal.  Vitals reviewed.   Diagnostics: FVC 2.35, FEV1 2.02. Predicted FVC 2.62, predicted FEV1 2.06. Spirometry is within the normal range.  Assessment and Plan: 1. Allergic rhinitis, unspecified seasonality, unspecified trigger   2. Asthma, mild intermittent, well-controlled     Meds ordered this encounter  Medications  . albuterol (PROVENTIL HFA;VENTOLIN HFA) 108 (90 Base) MCG/ACT inhaler    Sig: Inhale two puffs every four to six hours as needed for cough or wheeze.    Dispense:  1 Inhaler    Refill:  1    Patient Instructions   1. Continue Proventil HFA 2 puffs every 4-6 hours if needed  2.  Consider nasal saline spray/gel for nasal symptoms  3. OTC antihistamine - Claritin/Zyrtec/Allegra - if needed  4. Continue immunotherapy and AuviQ  5. Obtain fall flu vaccine  6. Return to clinic in 1 year or earlier if problem   Return in about 1 year (around 07/05/2019), or if symptoms worsen or fail to improve.    Thank you for the opportunity to care for this patient.  Please do not hesitate to contact me with  questions.  Gareth Morgan, FNP [facility]

## 2018-07-04 NOTE — Patient Instructions (Signed)
  1. Continue Proventil HFA 2 puffs every 4-6 hours if needed  2. Consider nasal saline spray/gel for nasal symptoms  3. OTC antihistamine - Claritin/Zyrtec/Allegra - if needed  4. Continue immunotherapy and AuviQ  5. Obtain fall flu vaccine  6. Return to clinic in 1 year or earlier if problem

## 2018-07-06 ENCOUNTER — Inpatient Hospital Stay (HOSPITAL_COMMUNITY): Payer: BC Managed Care – PPO

## 2018-07-06 ENCOUNTER — Emergency Department (HOSPITAL_COMMUNITY): Payer: BC Managed Care – PPO

## 2018-07-06 ENCOUNTER — Inpatient Hospital Stay (HOSPITAL_COMMUNITY)
Admission: EM | Admit: 2018-07-06 | Discharge: 2018-07-12 | DRG: 493 | Disposition: A | Discharge status: Skilled Nursing Facility | Payer: BC Managed Care – PPO | Attending: Internal Medicine | Admitting: Internal Medicine

## 2018-07-06 ENCOUNTER — Other Ambulatory Visit: Payer: Self-pay

## 2018-07-06 ENCOUNTER — Encounter (HOSPITAL_COMMUNITY): Payer: Self-pay | Admitting: Emergency Medicine

## 2018-07-06 DIAGNOSIS — E249 Cushing's syndrome, unspecified: Secondary | ICD-10-CM | POA: Diagnosis present

## 2018-07-06 DIAGNOSIS — Z794 Long term (current) use of insulin: Secondary | ICD-10-CM | POA: Diagnosis not present

## 2018-07-06 DIAGNOSIS — W19XXXA Unspecified fall, initial encounter: Secondary | ICD-10-CM

## 2018-07-06 DIAGNOSIS — Y92009 Unspecified place in unspecified non-institutional (private) residence as the place of occurrence of the external cause: Secondary | ICD-10-CM | POA: Diagnosis not present

## 2018-07-06 DIAGNOSIS — Z8249 Family history of ischemic heart disease and other diseases of the circulatory system: Secondary | ICD-10-CM

## 2018-07-06 DIAGNOSIS — Z6841 Body Mass Index (BMI) 40.0 and over, adult: Secondary | ICD-10-CM

## 2018-07-06 DIAGNOSIS — E119 Type 2 diabetes mellitus without complications: Secondary | ICD-10-CM

## 2018-07-06 DIAGNOSIS — E2740 Unspecified adrenocortical insufficiency: Secondary | ICD-10-CM | POA: Diagnosis present

## 2018-07-06 DIAGNOSIS — I951 Orthostatic hypotension: Secondary | ICD-10-CM | POA: Diagnosis not present

## 2018-07-06 DIAGNOSIS — Z90710 Acquired absence of both cervix and uterus: Secondary | ICD-10-CM

## 2018-07-06 DIAGNOSIS — S42341A Displaced spiral fracture of shaft of humerus, right arm, initial encounter for closed fracture: Secondary | ICD-10-CM | POA: Diagnosis not present

## 2018-07-06 DIAGNOSIS — E785 Hyperlipidemia, unspecified: Secondary | ICD-10-CM | POA: Diagnosis present

## 2018-07-06 DIAGNOSIS — K5900 Constipation, unspecified: Secondary | ICD-10-CM | POA: Diagnosis not present

## 2018-07-06 DIAGNOSIS — R4702 Dysphasia: Secondary | ICD-10-CM | POA: Diagnosis present

## 2018-07-06 DIAGNOSIS — M069 Rheumatoid arthritis, unspecified: Secondary | ICD-10-CM | POA: Diagnosis present

## 2018-07-06 DIAGNOSIS — R29700 NIHSS score 0: Secondary | ICD-10-CM | POA: Diagnosis present

## 2018-07-06 DIAGNOSIS — Z09 Encounter for follow-up examination after completed treatment for conditions other than malignant neoplasm: Secondary | ICD-10-CM

## 2018-07-06 DIAGNOSIS — E1149 Type 2 diabetes mellitus with other diabetic neurological complication: Secondary | ICD-10-CM | POA: Diagnosis not present

## 2018-07-06 DIAGNOSIS — Z79899 Other long term (current) drug therapy: Secondary | ICD-10-CM | POA: Diagnosis not present

## 2018-07-06 DIAGNOSIS — E1165 Type 2 diabetes mellitus with hyperglycemia: Secondary | ICD-10-CM | POA: Diagnosis present

## 2018-07-06 DIAGNOSIS — W1830XA Fall on same level, unspecified, initial encounter: Secondary | ICD-10-CM | POA: Diagnosis present

## 2018-07-06 DIAGNOSIS — E039 Hypothyroidism, unspecified: Secondary | ICD-10-CM | POA: Diagnosis present

## 2018-07-06 DIAGNOSIS — I10 Essential (primary) hypertension: Secondary | ICD-10-CM | POA: Diagnosis present

## 2018-07-06 DIAGNOSIS — G459 Transient cerebral ischemic attack, unspecified: Secondary | ICD-10-CM | POA: Diagnosis present

## 2018-07-06 DIAGNOSIS — G4733 Obstructive sleep apnea (adult) (pediatric): Secondary | ICD-10-CM | POA: Diagnosis present

## 2018-07-06 DIAGNOSIS — R4701 Aphasia: Secondary | ICD-10-CM | POA: Diagnosis present

## 2018-07-06 DIAGNOSIS — R55 Syncope and collapse: Secondary | ICD-10-CM

## 2018-07-06 DIAGNOSIS — Y92018 Other place in single-family (private) house as the place of occurrence of the external cause: Secondary | ICD-10-CM

## 2018-07-06 DIAGNOSIS — I5030 Unspecified diastolic (congestive) heart failure: Secondary | ICD-10-CM | POA: Diagnosis not present

## 2018-07-06 DIAGNOSIS — E0800 Diabetes mellitus due to underlying condition with hyperosmolarity without nonketotic hyperglycemic-hyperosmolar coma (NKHHC): Secondary | ICD-10-CM

## 2018-07-06 DIAGNOSIS — F329 Major depressive disorder, single episode, unspecified: Secondary | ICD-10-CM | POA: Diagnosis present

## 2018-07-06 DIAGNOSIS — Z419 Encounter for procedure for purposes other than remedying health state, unspecified: Secondary | ICD-10-CM

## 2018-07-06 DIAGNOSIS — M25531 Pain in right wrist: Secondary | ICD-10-CM | POA: Diagnosis present

## 2018-07-06 DIAGNOSIS — Z79890 Hormone replacement therapy (postmenopausal): Secondary | ICD-10-CM | POA: Diagnosis not present

## 2018-07-06 LAB — BASIC METABOLIC PANEL
Anion gap: 11 (ref 5–15)
BUN: 31 mg/dL — ABNORMAL HIGH (ref 8–23)
CO2: 20 mmol/L — ABNORMAL LOW (ref 22–32)
Calcium: 9.6 mg/dL (ref 8.9–10.3)
Chloride: 107 mmol/L (ref 98–111)
Creatinine, Ser: 1.31 mg/dL — ABNORMAL HIGH (ref 0.44–1.00)
GFR calc Af Amer: 49 mL/min — ABNORMAL LOW (ref >60)
GFR calc non Af Amer: 43 mL/min — ABNORMAL LOW (ref >60)
Glucose, Bld: 219 mg/dL — ABNORMAL HIGH (ref 70–99)
Potassium: 5.1 mmol/L (ref 3.5–5.1)
Sodium: 138 mmol/L (ref 135–145)

## 2018-07-06 LAB — URINALYSIS, ROUTINE W REFLEX MICROSCOPIC
Bilirubin Urine: NEGATIVE
Glucose, UA: >=500 mg/dL — AB
Hgb urine dipstick: NEGATIVE
Ketones, ur: NEGATIVE mg/dL
Leukocytes, UA: NEGATIVE
Nitrite: NEGATIVE
Protein, ur: 30 mg/dL — AB
Specific Gravity, Urine: 1.015 (ref 1.005–1.030)
pH: 5 (ref 5.0–8.0)

## 2018-07-06 LAB — CBC
HCT: 35 % — ABNORMAL LOW (ref 36.0–46.0)
Hemoglobin: 10.3 g/dL — ABNORMAL LOW (ref 12.0–15.0)
MCH: 27 pg (ref 26.0–34.0)
MCHC: 29.4 g/dL — ABNORMAL LOW (ref 30.0–36.0)
MCV: 91.9 fL (ref 78.0–100.0)
Platelets: 215 10*3/uL (ref 150–400)
RBC: 3.81 MIL/uL — ABNORMAL LOW (ref 3.87–5.11)
RDW: 14.1 % (ref 11.5–15.5)
WBC: 5.9 10*3/uL (ref 4.0–10.5)

## 2018-07-06 LAB — GLUCOSE, CAPILLARY: Glucose-Capillary: 107 mg/dL — ABNORMAL HIGH (ref 70–99)

## 2018-07-06 LAB — HEMOGLOBIN A1C
Hgb A1c MFr Bld: 7.9 % — ABNORMAL HIGH (ref 4.8–5.6)
Mean Plasma Glucose: 180.03 mg/dL

## 2018-07-06 LAB — TROPONIN I: Troponin I: <0.03 ng/mL (ref <0.03)

## 2018-07-06 MED ORDER — ACETAMINOPHEN 325 MG PO TABS: 650.0000 mg | ORAL_TABLET | ORAL | Status: DC | PRN

## 2018-07-06 MED ORDER — INSULIN GLARGINE 100 UNIT/ML ~~LOC~~ SOLN
60.0000 [IU] | Freq: Every day | SUBCUTANEOUS | Status: DC
  Administered 2018-07-06 – 2018-07-11 (×6): 60 [IU] via SUBCUTANEOUS
  Filled 2018-07-06 (×7): qty 0.6

## 2018-07-06 MED ORDER — ONDANSETRON HCL 4 MG/2ML IJ SOLN
4.0000 mg | Freq: Once | INTRAMUSCULAR | Status: AC
  Administered 2018-07-06: 4 mg via INTRAVENOUS
  Filled 2018-07-06: qty 2

## 2018-07-06 MED ORDER — AMLODIPINE BESYLATE 5 MG PO TABS
5.0000 mg | ORAL_TABLET | Freq: Every day | ORAL | Status: DC
  Administered 2018-07-07: 5 mg via ORAL
  Filled 2018-07-06 (×2): qty 1

## 2018-07-06 MED ORDER — DULAGLUTIDE 1.5 MG/0.5ML ~~LOC~~ SOAJ: 1.5000 mg | SUBCUTANEOUS | Status: DC

## 2018-07-06 MED ORDER — ENOXAPARIN SODIUM 40 MG/0.4ML ~~LOC~~ SOLN
40.0000 mg | SUBCUTANEOUS | Status: DC
  Administered 2018-07-06 – 2018-07-11 (×6): 40 mg via SUBCUTANEOUS
  Filled 2018-07-06 (×6): qty 0.4

## 2018-07-06 MED ORDER — SODIUM CHLORIDE 0.9 % IV SOLN
INTRAVENOUS | Status: DC
  Administered 2018-07-06: 13:00:00 via INTRAVENOUS

## 2018-07-06 MED ORDER — ADULT MULTIVITAMIN W/MINERALS CH
1.0000 | ORAL_TABLET | Freq: Every day | ORAL | Status: DC
  Administered 2018-07-07 – 2018-07-12 (×6): 1 via ORAL
  Filled 2018-07-06 (×6): qty 1

## 2018-07-06 MED ORDER — SODIUM CHLORIDE 0.9 % IV SOLN: INTRAVENOUS | Status: DC

## 2018-07-06 MED ORDER — VITAMIN B-12 100 MCG PO TABS
100.0000 ug | ORAL_TABLET | Freq: Every day | ORAL | Status: DC
  Administered 2018-07-07 – 2018-07-12 (×6): 100 ug via ORAL
  Filled 2018-07-06 (×6): qty 1

## 2018-07-06 MED ORDER — MORPHINE SULFATE (PF) 2 MG/ML IV SOLN
2.0000 mg | Freq: Once | INTRAVENOUS | Status: AC
  Administered 2018-07-06: 2 mg via INTRAVENOUS
  Filled 2018-07-06: qty 1

## 2018-07-06 MED ORDER — BIOTIN 1 MG PO CAPS: 1.0000 | ORAL_CAPSULE | Freq: Every day | ORAL | Status: DC

## 2018-07-06 MED ORDER — MORPHINE SULFATE (PF) 4 MG/ML IV SOLN
4.0000 mg | Freq: Once | INTRAVENOUS | Status: AC
  Administered 2018-07-06: 4 mg via INTRAVENOUS
  Filled 2018-07-06: qty 1

## 2018-07-06 MED ORDER — INSULIN ASPART 100 UNIT/ML ~~LOC~~ SOLN
0.0000 [IU] | Freq: Three times a day (TID) | SUBCUTANEOUS | Status: DC
  Administered 2018-07-07: 2 [IU] via SUBCUTANEOUS
  Administered 2018-07-07: 5 [IU] via SUBCUTANEOUS
  Administered 2018-07-07: 2 [IU] via SUBCUTANEOUS
  Administered 2018-07-08: 3 [IU] via SUBCUTANEOUS
  Administered 2018-07-08: 2 [IU] via SUBCUTANEOUS
  Administered 2018-07-08 – 2018-07-10 (×5): 3 [IU] via SUBCUTANEOUS
  Administered 2018-07-11: 2 [IU] via SUBCUTANEOUS
  Administered 2018-07-11: 3 [IU] via SUBCUTANEOUS
  Administered 2018-07-12: 5 [IU] via SUBCUTANEOUS
  Administered 2018-07-12: 3 [IU] via SUBCUTANEOUS
  Administered 2018-07-12: 2 [IU] via SUBCUTANEOUS

## 2018-07-06 MED ORDER — LORATADINE 10 MG PO TABS
10.0000 mg | ORAL_TABLET | Freq: Every day | ORAL | Status: DC
  Administered 2018-07-07 – 2018-07-12 (×6): 10 mg via ORAL
  Filled 2018-07-06 (×6): qty 1

## 2018-07-06 MED ORDER — FOLIC ACID 1 MG PO TABS
2.0000 mg | ORAL_TABLET | Freq: Every day | ORAL | Status: DC
  Administered 2018-07-07 – 2018-07-12 (×6): 2 mg via ORAL
  Filled 2018-07-06 (×6): qty 2

## 2018-07-06 MED ORDER — LORAZEPAM 0.5 MG PO TABS
0.5000 mg | ORAL_TABLET | ORAL | Status: DC | PRN
  Administered 2018-07-06 – 2018-07-07 (×2): 0.5 mg via ORAL
  Filled 2018-07-06 (×2): qty 1

## 2018-07-06 MED ORDER — HYDROCHLOROTHIAZIDE 12.5 MG PO CAPS
12.5000 mg | ORAL_CAPSULE | Freq: Every day | ORAL | Status: DC
  Administered 2018-07-07: 12.5 mg via ORAL
  Filled 2018-07-06 (×2): qty 1

## 2018-07-06 MED ORDER — ACETAMINOPHEN 650 MG RE SUPP: 650.0000 mg | RECTAL | Status: DC | PRN

## 2018-07-06 MED ORDER — CENTRUM SILVER PO TABS: ORAL_TABLET | Freq: Every day | ORAL | Status: DC

## 2018-07-06 MED ORDER — ALBUTEROL SULFATE (2.5 MG/3ML) 0.083% IN NEBU: 3.0000 mL | INHALATION_SOLUTION | RESPIRATORY_TRACT | Status: DC | PRN

## 2018-07-06 MED ORDER — ASPIRIN 325 MG PO TABS
325.0000 mg | ORAL_TABLET | Freq: Every day | ORAL | Status: DC
  Administered 2018-07-07 – 2018-07-12 (×6): 325 mg via ORAL
  Filled 2018-07-06 (×6): qty 1

## 2018-07-06 MED ORDER — IRBESARTAN 300 MG PO TABS
300.0000 mg | ORAL_TABLET | Freq: Every day | ORAL | Status: DC
  Administered 2018-07-07: 300 mg via ORAL
  Filled 2018-07-06 (×2): qty 1

## 2018-07-06 MED ORDER — ASPIRIN 300 MG RE SUPP: 300.0000 mg | Freq: Every day | RECTAL | Status: DC

## 2018-07-06 MED ORDER — METFORMIN HCL ER 750 MG PO TB24
1500.0000 mg | ORAL_TABLET | Freq: Every day | ORAL | Status: DC
  Filled 2018-07-06: qty 2

## 2018-07-06 MED ORDER — HYDRALAZINE HCL 25 MG PO TABS
25.0000 mg | ORAL_TABLET | Freq: Two times a day (BID) | ORAL | Status: DC
  Administered 2018-07-06: 25 mg via ORAL
  Filled 2018-07-06 (×3): qty 1

## 2018-07-06 MED ORDER — VITAMIN D 1000 UNITS PO TABS: 50000.0000 [IU] | ORAL_TABLET | ORAL | Status: DC

## 2018-07-06 MED ORDER — BASAGLAR KWIKPEN 100 UNIT/ML ~~LOC~~ SOPN: 60.0000 [IU] | PEN_INJECTOR | Freq: Every day | SUBCUTANEOUS | Status: DC

## 2018-07-06 MED ORDER — ONDANSETRON HCL 4 MG/2ML IJ SOLN
4.0000 mg | Freq: Four times a day (QID) | INTRAMUSCULAR | Status: DC | PRN
  Administered 2018-07-06: 4 mg via INTRAVENOUS
  Filled 2018-07-06: qty 2

## 2018-07-06 MED ORDER — ESCITALOPRAM OXALATE 10 MG PO TABS
20.0000 mg | ORAL_TABLET | Freq: Every day | ORAL | Status: DC
  Administered 2018-07-07 – 2018-07-12 (×6): 20 mg via ORAL
  Filled 2018-07-06 (×6): qty 2

## 2018-07-06 MED ORDER — IBUPROFEN 200 MG PO TABS
200.0000 mg | ORAL_TABLET | Freq: Four times a day (QID) | ORAL | Status: DC | PRN
  Administered 2018-07-07 – 2018-07-08 (×3): 200 mg via ORAL
  Filled 2018-07-06 (×3): qty 1

## 2018-07-06 MED ORDER — LEVOTHYROXINE SODIUM 25 MCG PO TABS
125.0000 ug | ORAL_TABLET | Freq: Every day | ORAL | Status: DC
  Administered 2018-07-07 – 2018-07-12 (×6): 125 ug via ORAL
  Filled 2018-07-06 (×6): qty 1

## 2018-07-06 MED ORDER — MAGNESIUM 500 MG PO TABS: 500.0000 mg | ORAL_TABLET | Freq: Every day | ORAL | Status: DC

## 2018-07-06 MED ORDER — METAXALONE 800 MG PO TABS
400.0000 mg | ORAL_TABLET | Freq: Every day | ORAL | Status: DC | PRN
  Administered 2018-07-06 – 2018-07-11 (×3): 400 mg via ORAL
  Filled 2018-07-06 (×2): qty 0.5
  Filled 2018-07-06: qty 1
  Filled 2018-07-06 (×2): qty 0.5
  Filled 2018-07-06 (×3): qty 1

## 2018-07-06 MED ORDER — MORPHINE SULFATE (PF) 2 MG/ML IV SOLN
1.0000 mg | INTRAVENOUS | Status: DC | PRN
  Administered 2018-07-07 – 2018-07-09 (×10): 1 mg via INTRAVENOUS
  Filled 2018-07-06 (×12): qty 1

## 2018-07-06 MED ORDER — STROKE: EARLY STAGES OF RECOVERY BOOK
Freq: Once | Status: AC
  Administered 2018-07-07: 06:00:00
  Filled 2018-07-06: qty 1

## 2018-07-06 MED ORDER — MAGNESIUM GLUCONATE 500 MG PO TABS
500.0000 mg | ORAL_TABLET | Freq: Every day | ORAL | Status: DC
  Administered 2018-07-06 – 2018-07-12 (×6): 500 mg via ORAL
  Filled 2018-07-06 (×7): qty 1

## 2018-07-06 MED ORDER — ACETAMINOPHEN 160 MG/5ML PO SOLN: 650.0000 mg | ORAL | Status: DC | PRN

## 2018-07-06 MED ORDER — FERROUS SULFATE 325 (65 FE) MG PO TABS
325.0000 mg | ORAL_TABLET | Freq: Every day | ORAL | Status: DC
  Administered 2018-07-07 – 2018-07-12 (×6): 325 mg via ORAL
  Filled 2018-07-06 (×6): qty 1

## 2018-07-06 MED ORDER — ASPIRIN-ACETAMINOPHEN-CAFFEINE 250-250-65 MG PO TABS
2.0000 | ORAL_TABLET | Freq: Four times a day (QID) | ORAL | Status: DC | PRN
  Administered 2018-07-06: 2 via ORAL
  Filled 2018-07-06 (×2): qty 2

## 2018-07-06 MED ORDER — IRBESARTAN-HYDROCHLOROTHIAZIDE 300-12.5 MG PO TABS: 1.0000 | ORAL_TABLET | Freq: Every day | ORAL | Status: DC

## 2018-07-06 MED ORDER — SENNOSIDES-DOCUSATE SODIUM 8.6-50 MG PO TABS: 1.0000 | ORAL_TABLET | Freq: Every evening | ORAL | Status: DC | PRN

## 2018-07-06 MED ORDER — LATANOPROST 0.005 % OP SOLN
1.0000 [drp] | Freq: Every day | OPHTHALMIC | Status: DC
  Administered 2018-07-06 – 2018-07-11 (×6): 1 [drp] via OPHTHALMIC
  Filled 2018-07-06: qty 2.5

## 2018-07-06 NOTE — Consult Note (Addendum)
NEURO HOSPITALIST      Requesting Physician: Dr. Tomi Bamberger    Chief Complaint: fall/dizziness/arm pain  History obtained from:  Patient     HPI:                                                                                                                                         Jocelyn Kaufman is an 62 y.o. female with PMH of HTN, HLD, DM,  Paresthesias, OSA, cushing's disease who presented to [facility] ED after a fall where she was dizzy. She also noted arm pain and hit her head.   Patient stated that around noon yesterday 07/06/18 she felt dizzy. She described the dizziness as she felt like she was spinning.  Denies any diplopia or other vision changes.  She reports that the  dizziness would happen when she was sitting or when she was standing.  She went to bed last night and woke up twice in the middle of the night to urinate. She reports that she was feeling unsteady when walking to the bathroom and this held on to things. She decided she did not feel well enough to go to church today. She stayed home and watched the church service on the computer. While watching she had a sudden hot flash. She decided to go and try and get some water, but on the way to the kitchen she fell. She hit her right arm on the washer, and she did hit her head on the floor. She is unsure if she ever loss consciousness. She could not get off the floor but was able to pull things down and get to her cell phone. She called her sister. When speaking with her sister she was unable to form complete sentences. She could say things like " need help" fell to floor"  "arm broken". Sister Silva Bandy ( who she could only manage to call her phyll) transported her to the hospital. Her speech difficulties have resolved. Right arm is in a sling and ace bandage. Denies HA, N/V, CP, SOB, numbness or tingling ( outside of her normal paresthesias), ETOH use, drugs or smoking history,  ED course:  CT Head: no  acute abnormality BP:157/75 BG:219 UA + for glucose, protein and bacteria  No prior history of stroke.    Date last known well: Date: 07/06/2018 Time last known well: Unable to determine tPA Given: No: contraindicated ; outside of window   Modified Rankin: Rankin Score=0  NIHSS:0 1a Level of Conscious:0 1b LOC Questions: 0 1c LOC Commands: 0 2 Best Gaze: 0 3 Visual: 0 4 Facial Palsy: 0 5a Motor Arm - left: 0  5b Motor Arm - Right: UN d/t arm splint and immobilizer 6a Motor Leg - Left: 0 6b Motor Leg - Right: 0 7 Limb Ataxia: UN left arm in splint but right is fine  8 Sensory: 0 9 Best Language: 0 10 Dysarthria:0 11 Extinct. and Inattention:0 TOTAL: 0   Past Medical History:  Diagnosis Date  . Adrenal adenoma   . Cushing disease ([place])   . Depression   . Diabetes mellitus without complication ([place])   . Hyperlipidemia   . Hypertension   . Hypothyroidism   . Meralgia paresthetica of right side 01/10/2017  . Migraine   . Pericardial effusion   . 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 APPENDECTOMY    . CYST REMOVAL LEG Right    Removed from right thigh joint  . TONSILLECTOMY    . TOTAL VAGINAL HYSTERECTOMY    . UMBILICAL HERNIA REPAIR      Family History  Problem Relation Age of Onset  . Hypertension Mother   . Stroke Mother   . Kidney failure Mother   . Heart disease Mother        heart murmur  . Glaucoma Mother   . Stroke Father   . COPD Father   . Cancer - Prostate Father   . Colon cancer Father          Social History:  reports that she has never smoked. She has never used smokeless tobacco. She reports that she drinks alcohol. She reports that she does not use drugs.  Allergies:  Allergies  Allergen Reactions  . Invokana [Canagliflozin] Other (See Comments)    Urinary incontinence  . Percocet [Oxycodone-Acetaminophen] Nausea And Vomiting  . Prilosec [Omeprazole] Rash  . Rogaine  [Minoxidil] Rash  . Victoza [Liraglutide] Itching    Medications:                                                                                                                           Current Facility-Administered Medications  Medication Dose Route Frequency Provider Last Rate Last Dose  . 0.9 %  sodium chloride infusion   Intravenous Continuous Dorie Rank, MD 125 mL/hr at 07/06/18 1236     Current Outpatient Medications  Medication Sig Dispense Refill  . acetaminophen (TYLENOL) 500 MG tablet Take 1,000 mg by mouth every 6 (six) hours as needed for mild pain.    Marland Kitchen albuterol (PROVENTIL HFA;VENTOLIN HFA) 108 (90 Base) MCG/ACT inhaler Inhale two puffs every four to six hours as needed for cough or wheeze. (Patient taking differently: Inhale 2 puffs into the lungs every 4 (four) hours as needed for wheezing (or cough). ) 1 Inhaler 1  . amLODipine (NORVASC) 5 MG tablet Take 5 mg by mouth daily.    . Aspirin-Acetaminophen-Caffeine (EXCEDRIN MIGRAINE PO) Take 2 tablets by mouth as needed (migraine).     . Biotin 1 MG CAPS Take 1 capsule by mouth  daily.     . cetirizine (ZYRTEC) 10 MG tablet Take 10 mg by mouth daily.    . Cholecalciferol (VITAMIN D-3 PO) Take 50,000 Units by mouth every Monday.     . Dulaglutide (TRULICITY) 1.5 XN/2.3FT SOPN Inject 1.5 mg into the skin once a week.     . empagliflozin (JARDIANCE) 25 MG TABS tablet Take 25 mg by mouth daily.     Marland Kitchen EPINEPHrine (EPIPEN 2-PAK) 0.3 mg/0.3 mL IJ SOAJ injection Use as directed for life-threatening allergic reaction. (Patient taking differently: Inject 0.3 mg into the muscle once. for life-threatening allergic reaction.) 2 Device 1  . escitalopram (LEXAPRO) 20 MG tablet Take 20 mg by mouth daily.    . ferrous sulfate 325 (65 FE) MG tablet Take 365 mg by mouth daily.     . folic acid (FOLVITE) 1 MG tablet Take 2 mg by mouth daily.    . Golimumab (SIMPONI [place]) Inject into the skin.    . hydrALAZINE (APRESOLINE) 25 MG tablet Take 25 mg by  mouth 2 (two) times daily.    Marland Kitchen ibuprofen (ADVIL,MOTRIN) 200 MG tablet Take 200 mg by mouth every 6 (six) hours as needed for moderate pain.     . Insulin Glargine (BASAGLAR KWIKPEN) 100 UNIT/ML SOPN Inject 60-66 Units into the skin daily.     . irbesartan-hydrochlorothiazide (AVALIDE) 300-12.5 MG tablet Take 1 tablet by mouth daily.    Marland Kitchen levothyroxine (SYNTHROID) 125 MCG tablet Take 125 mcg by mouth daily before breakfast. BRAND ONLY    . LORazepam (ATIVAN) 0.5 MG tablet Take 0.5 mg by mouth as needed for anxiety.    . Magnesium 500 MG TABS Take 500 mg by mouth daily.     . metaxalone (SKELAXIN) 800 MG tablet Take 400 mg by mouth daily as needed for muscle spasms.     . metFORMIN (GLUCOPHAGE-XR) 500 MG 24 hr tablet Take 1,500 mg by mouth daily.     . Methotrexate, Anti-Rheumatic, (METHOTREXATE, PF, [place]) Inject 0.8 Units into the skin once a week.    . Multiple Vitamins-Minerals (CENTRUM SILVER PO) Take 1 tablet by mouth daily.    . TRAVATAN Z 0.004 % SOLN ophthalmic solution Place 0.004 drops into both eyes daily.    . vitamin B-12 (CYANOCOBALAMIN) 100 MCG tablet Take 100 mcg by mouth daily.        ROS:                                                                                                                                       History obtained from the patient  General ROS: negative for - chills, fatigue, fever, night sweats, weight gain or weight loss  Ophthalmic ROS: negative for - blurry vision, double vision, eye pain or loss of vision Respiratory ROS: negative for - cough,  shortness of breath or wheezing Cardiovascular ROS: negative for - chest pain, dyspnea on exertion,  Musculoskeletal ROS: positive for - joint tenderness in right arm and pain Neurological ROS: as noted in HPI   General Examination:                                                                                                      Blood pressure (!) 147/69, pulse 79, temperature 98.6 F (37 C),  temperature source Oral, resp. rate 13, height 5' 3.6" (1.615 m), weight 104.4 kg, SpO2 100 %.  HEENT-  Normocephalic, no lesions, without obvious abnormality.  Normal external eye and conjunctiva. Cardiovascular- S1-S2 audible, pulses palpable throughout   Lungs-no rhonchi or wheezing noted, no excessive working breathing.  Saturations within normal limits on RA Extremities- Warm, dry and intact Musculoskeletal- joint tenderness and pain of right arm and shoulder. Right arm in splint and ace bandage Skin-warm and dry, no hyperpigmentation, vitiligo, or suspicious lesions  Neurological Examination Mental Status: Alert, oriented, thought content appropriate.  Speech fluent without evidence of aphasia.  Able to follow  commands without difficulty. Cranial Nerves: II:  Visual fields grossly normal,  III,IV, VI: ptosis not present, extra-ocular motions intact bilaterally, pupils equal, round, reactive to light and accommodation V,VII: smile symmetric, facial light touch sensation normal bilaterally VIII: hearing normal bilaterally IX,X: uvula rises symmetrically XI: bilateral shoulder shrug XII: midline tongue extension Motor: Right : UE Fractured Left:     Upper extremity   5/5  Lower extremity   5/5     Lower extremity   5/5 Tone and bulk:normal tone throughout; no atrophy noted Sensory: light touch intact throughout, bilaterally Deep Tendon Reflexes: 2+ and symmetric  Patella and left bicep.  Plantars: Right: downgoing   Left: downgoing Cerebellar: normal finger-to-nose on left side, unable to perform on right side d/t right arm fracture normal heel-to-shin test Gait: deferred   Lab Results: Basic Metabolic Panel: Recent Labs  Lab 07/06/18 1144  NA 138  K 5.1  CL 107  CO2 20*  GLUCOSE 219*  BUN 31*  CREATININE 1.31*  CALCIUM 9.6    CBC: Recent Labs  Lab 07/06/18 1144  WBC 5.9  HGB 10.3*  HCT 35.0*  MCV 91.9  PLT 215    Lipid Panel: No results for input(s):  CHOL, TRIG, HDL, CHOLHDL, VLDL, LDLCALC in the last 168 hours.  CBG: No results for input(s): GLUCAP in the last 168 hours.  Imaging: Dg Shoulder Right  Result Date: 07/06/2018 CLINICAL DATA:  Dizzy.  Fall.  Right arm and shoulder pain. EXAM: RIGHT SHOULDER - 2+ VIEW COMPARISON:  None. FINDINGS: An oblique midshaft right humerus fracture demonstrates at least 15 degrees angulation. Fracture demonstrates displacement of 1/2 the shaft width. Degenerative changes present at the shoulder without additional fractures. IMPRESSION: 1. Oblique fracture of the midshaft right humerus with moderate displacement and angulation. Electronically Signed   By: San Morelle M.D.   On: 07/06/2018 13:49   Ct Head Wo Contrast  Result Date: 07/06/2018 CLINICAL DATA:  Fall.  Dizziness. EXAM: CT HEAD WITHOUT CONTRAST TECHNIQUE: Contiguous axial images were obtained from the base of the skull through the  vertex without intravenous contrast. COMPARISON:  None. FINDINGS: Brain: No evidence of acute infarction, hemorrhage, hydrocephalus, extra-axial collection or mass lesion/mass effect. Vascular: Calcified atherosclerosis in the intracranial carotids. Skull: Normal. Negative for fracture or focal lesion. Sinuses/Orbits: No acute finding. Other: None. IMPRESSION: No acute intracranial abnormalities. Electronically Signed   By: Dorise Bullion III M.D   On: 07/06/2018 13:28   Dg Humerus Right  Result Date: 07/06/2018 CLINICAL DATA:  Fall.  Right upper arm pain.  Initial encounter. EXAM: RIGHT HUMERUS - 2+ VIEW COMPARISON:  None. FINDINGS: A spiral fracture of the humeral diaphysis is seen with medial displacement and angulation of the distal fracture fragment. Generalized osteopenia noted. IMPRESSION: Displaced spiral fracture of the humeral diaphysis. Electronically Signed   By: Earle Gell M.D.   On: 07/06/2018 13:49       Laurey Morale, MSN, NP-C [facility] [phone]  07/06/2018, 4:00 PM    Attending physician note to follow with Assessment and plan . I have seen the patient and reviewed the above note.  Assessment: 62 y.o. female with PMH of HTN, HLD, DM,  Paresthesias, OSA, cushing's disease who presented to [facility] ED after a fall where she was dizzy with aphasia.  With her not losing consciousness, and never having any clear symptoms of concussion, I think head injury as an etiology for the difficulty speaking is very unlikely.  She felt cognitively clear and clearly describes that she could think of what she wanted to say but could not make her mouth say it, only coming out as broken words.  This description is most consistent with an expressive aphasia.  At this time, I think that the most likely etiology would be a transient ischemic attack and would treat it as such.   Impression: Stroke Risk Factors - diabetes mellitus, hyperlipidemia and hypertension Etiology : further stroke work up needed   Recommendations:  --MRI Brain  --MRA of the head w/o and neck with contrast   --Echocardiogram -- ASA -- High intensity Statin if LDL > 70 -- HgbA1c, fasting lipid panel -- PT consult, OT consult, Speech consult --Telemetry monitoring --Frequent neuro checks --Stroke swallow screen    --please page stroke NP  Or  PA  Or MD from 8am -4 pm  as this patient from this time will be  followed by the stroke.   You can look them up on [email or web address]  Password [email or web address]  Roland Rack, MD [facility] [phone]  If 7pm- 7am, please page neurology on call as listed in [place].

## 2018-07-06 NOTE — ED Notes (Signed)
Main lab called to add on troponin I.

## 2018-07-06 NOTE — ED Notes (Signed)
Patient back from x-ray 

## 2018-07-06 NOTE — Progress Notes (Signed)
PHARMACIST - PHYSICIAN ORDER COMMUNICATION  CONCERNING: P&T Medication Policy on Herbal Medications  DESCRIPTION:  This patient's order for:  biotin  has been noted.  This product(s) is classified as an "herbal" or natural product. Due to a lack of definitive safety studies or FDA approval, nonstandard manufacturing practices, plus the potential risk of unknown drug-drug interactions while on inpatient medications, the Pharmacy and Therapeutics Committee does not permit the use of "herbal" or natural products of this type within [place].   ACTION TAKEN: The pharmacy department is unable to verify this order at this time and your patient has been informed of this safety policy. Please reevaluate patient's clinical condition at discharge and address if the herbal or natural product(s) should be resumed at that time.  Justine Dines A. Enedelia Martorelli, PharmD, BCPS Clinical Pharmacist [place] Pager: [phone] Please utilize Amion for appropriate phone number to reach the unit pharmacist ([facility])   

## 2018-07-06 NOTE — H&P (Signed)
History and Physical  Jocelyn Kaufman FGH:[id number] DOB: Apr 27, 1956 DOA: 07/06/2018  Referring physician:  Dorie Rank, MD   PCP: Deland Pretty, MD  Outpatient Specialists:none Patient coming from: Home & is able to ambulate   Chief Complaint: Fall  HPI: Jocelyn Kaufman is a 62 y.o. female with medical history significant for adrenal insufficiency questions disease depression diabetes mellitus hypothyroidism dizziness pericardial effusion migraine headache whole reported that she had dizziness yesterday and this morning she did not feel quite right she says she was feeling hot around 10 AM so she got up to get something to drink and to turn on the air conditioner she felt lightheaded and fell she says she did not pass out she hit her head and right arm on the washer and landed on the floor she is complaining of right arm pain it was found to be fractured has a spiral fracture she reported that after she fell she did not feel like she could talk properly she says she was only able to sit to help her fall and could not complete the sentence but this has subsequently resolved.  Patient told me she did not feel dizzy or pass out.  ED Course: Neurology consult was obtained  Review of Systems:  Pt complains of dizziness weakness right arm pain  Pt denies any no chest pain no fever no palpitation no abdominal pain nausea vomiting.  Review of systems are otherwise negative   Past Medical History:  Diagnosis Date  . Adrenal adenoma   . 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 Depression   . Diabetes mellitus without complication ([place])   . Hyperlipidemia   . Hypertension   . Hypothyroidism   . Meralgia paresthetica of right side 01/10/2017  . Migraine   . Pericardial effusion   . 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 APPENDECTOMY    . CYST REMOVAL LEG Right    Removed from right thigh joint  . TONSILLECTOMY    . TOTAL VAGINAL HYSTERECTOMY    .  UMBILICAL HERNIA REPAIR      Social History:  reports that she has never smoked. She has never used smokeless tobacco. She reports that she drinks alcohol. She reports that she does not use drugs.   Allergies  Allergen Reactions  . Invokana [Canagliflozin] Other (See Comments)    Urinary incontinence  . Percocet [Oxycodone-Acetaminophen] Nausea And Vomiting  . Prilosec [Omeprazole] Rash  . Rogaine [Minoxidil] Rash  . Victoza [Liraglutide] Itching    Family History  Problem Relation Age of Onset  . Hypertension Mother   . Stroke Mother   . Kidney failure Mother   . Heart disease Mother        heart murmur  . Glaucoma Mother   . Stroke Father   . COPD Father   . Cancer - Prostate Father   . Colon cancer Father       Prior to Admission medications   Medication Sig Start Date End Date Taking? Authorizing Provider  acetaminophen (TYLENOL) 500 MG tablet Take 1,000 mg by mouth every 6 (six) hours as needed for mild pain.   Yes [provider]  albuterol (PROVENTIL HFA;VENTOLIN HFA) 108 (90 Base) MCG/ACT inhaler Inhale two puffs every four to six hours as needed for cough or wheeze. Patient taking differently: Inhale 2 puffs into the lungs every 4 (four) hours as needed for wheezing (or cough).  07/04/18  Yes Gareth Morgan  M, FNP  amLODipine (NORVASC) 5 MG tablet Take 5 mg by mouth daily. 06/04/16  Yes [provider]  Aspirin-Acetaminophen-Caffeine (EXCEDRIN MIGRAINE PO) Take 2 tablets by mouth as needed (migraine).    Yes [provider]  Biotin 1 MG CAPS Take 1 capsule by mouth daily.    Yes [provider]  cetirizine (ZYRTEC) 10 MG tablet Take 10 mg by mouth daily.   Yes [provider]  Cholecalciferol (VITAMIN D-3 PO) Take 50,000 Units by mouth every Monday.    Yes [provider]  Dulaglutide (TRULICITY) 1.5 TK/1.6WF SOPN Inject 1.5 mg into the skin once a week.    Yes [provider]  empagliflozin (JARDIANCE) 25 MG  TABS tablet Take 25 mg by mouth daily.  05/22/18  Yes [provider]  EPINEPHrine (EPIPEN 2-PAK) 0.3 mg/0.3 mL IJ SOAJ injection Use as directed for life-threatening allergic reaction. Patient taking differently: Inject 0.3 mg into the muscle once. for life-threatening allergic reaction. 08/21/16  Yes Kozlow, Donnamarie Poag, MD  escitalopram (LEXAPRO) 20 MG tablet Take 20 mg by mouth daily.   Yes [provider]  ferrous sulfate 325 (65 FE) MG tablet Take 365 mg by mouth daily.    Yes [provider]  folic acid (FOLVITE) 1 MG tablet Take 2 mg by mouth daily.   Yes [provider]  Golimumab ([place]) Inject into the skin.   Yes [provider]  hydrALAZINE (APRESOLINE) 25 MG tablet Take 25 mg by mouth 2 (two) times daily.   Yes [provider]  ibuprofen (ADVIL,MOTRIN) 200 MG tablet Take 200 mg by mouth every 6 (six) hours as needed for moderate pain.    Yes [provider]  Insulin Glargine (BASAGLAR KWIKPEN) 100 UNIT/ML SOPN Inject 60-66 Units into the skin daily.  08/01/16  Yes [provider]  irbesartan-hydrochlorothiazide (AVALIDE) 300-12.5 MG tablet Take 1 tablet by mouth daily.   Yes [provider]  levothyroxine (SYNTHROID) 125 MCG tablet Take 125 mcg by mouth daily before breakfast. BRAND ONLY   Yes [provider]  LORazepam (ATIVAN) 0.5 MG tablet Take 0.5 mg by mouth as needed for anxiety.   Yes [provider]  Magnesium 500 MG TABS Take 500 mg by mouth daily.    Yes [provider]  metaxalone (SKELAXIN) 800 MG tablet Take 400 mg by mouth daily as needed for muscle spasms.    Yes [provider]  metFORMIN (GLUCOPHAGE-XR) 500 MG 24 hr tablet Take 1,500 mg by mouth daily.    Yes [provider]  Methotrexate, Anti-Rheumatic, (METHOTREXATE, PF, [place]) Inject 0.8 Units into the skin once a week.   Yes [provider]  Multiple Vitamins-Minerals (CENTRUM SILVER  PO) Take 1 tablet by mouth daily.   Yes [provider]  TRAVATAN Z 0.004 % SOLN ophthalmic solution Place 0.004 drops into both eyes daily. 07/21/16  Yes [provider]  vitamin B-12 (CYANOCOBALAMIN) 100 MCG tablet Take 100 mcg by mouth daily.    Yes [provider]    Physical Exam: BP (!) 147/69   Pulse 79   Temp 98.6 F (37 C) (Oral)   Resp 13   Ht 5' 3.6" (1.615 m)   Wt 104.4 kg   SpO2 100%   BMI 40.01 kg/m   General: Well-nourished well-developed alert oriented x3 Eyes: PLA EOMI ENT: Normal external ear no rhinorrhea Neck: Neck is supple Cardiovascular: Regular rate and rhythm no murmur pulses palpable Respiratory: No  respiratory distress no wheezing effort is normal Abdomen: Obese normal active bowel sounds soft nontender no hepatosplenomegaly Skin: Skin is intact warm and dry Musculoskeletal: Right upper extremity there is no swelling there is however disc decreased range of motion and tenderness and bony tenderness on the forearm she is wearing an arm sling and Ace wrap Psychiatric: Affect appropriate behavior appropriate Neurologic: Alert and oriented x3          Labs on Admission:  Basic Metabolic Panel: Recent Labs  Lab 07/06/18 1144  NA 138  K 5.1  CL 107  CO2 20*  GLUCOSE 219*  BUN 31*  CREATININE 1.31*  CALCIUM 9.6   Liver Function Tests: No results for input(s): AST, ALT, ALKPHOS, BILITOT, PROT, ALBUMIN in the last 168 hours. No results for input(s): LIPASE, AMYLASE in the last 168 hours. No results for input(s): AMMONIA in the last 168 hours. CBC: Recent Labs  Lab 07/06/18 1144  WBC 5.9  HGB 10.3*  HCT 35.0*  MCV 91.9  PLT 215   Cardiac Enzymes: Recent Labs  Lab 07/06/18 1144  TROPONINI <0.03    BNP (last 3 results) No results for input(s): BNP in the last 8760 hours.  ProBNP (last 3 results) No results for input(s): PROBNP in the last 8760 hours.  CBG: No results for input(s): GLUCAP in the last 168  hours.  Radiological Exams on Admission: Dg Shoulder Right  Result Date: 07/06/2018 CLINICAL DATA:  Dizzy.  Fall.  Right arm and shoulder pain. EXAM: RIGHT SHOULDER - 2+ VIEW COMPARISON:  None. FINDINGS: An oblique midshaft right humerus fracture demonstrates at least 15 degrees angulation. Fracture demonstrates displacement of 1/2 the shaft width. Degenerative changes present at the shoulder without additional fractures. IMPRESSION: 1. Oblique fracture of the midshaft right humerus with moderate displacement and angulation. Electronically Signed   By: San Morelle M.D.   On: 07/06/2018 13:49   Ct Head Wo Contrast  Result Date: 07/06/2018 CLINICAL DATA:  Fall.  Dizziness. EXAM: CT HEAD WITHOUT CONTRAST TECHNIQUE: Contiguous axial images were obtained from the base of the skull through the vertex without intravenous contrast. COMPARISON:  None. FINDINGS: Brain: No evidence of acute infarction, hemorrhage, hydrocephalus, extra-axial collection or mass lesion/mass effect. Vascular: Calcified atherosclerosis in the intracranial carotids. Skull: Normal. Negative for fracture or focal lesion. Sinuses/Orbits: No acute finding. Other: None. IMPRESSION: No acute intracranial abnormalities. Electronically Signed   By: Dorise Bullion III M.D   On: 07/06/2018 13:28   Dg Humerus Right  Result Date: 07/06/2018 CLINICAL DATA:  Fall.  Right upper arm pain.  Initial encounter. EXAM: RIGHT HUMERUS - 2+ VIEW COMPARISON:  None. FINDINGS: A spiral fracture of the humeral diaphysis is seen with medial displacement and angulation of the distal fracture fragment. Generalized osteopenia noted. IMPRESSION: Displaced spiral fracture of the humeral diaphysis. Electronically Signed   By: Earle Gell M.D.   On: 07/06/2018 13:49    EKG:   Per ER MD  Assessment/Plan Present on Admission: . Essential hypertension . Displaced spiral fracture of shaft of humerus, right arm, initial encounter for closed fracture Presyncope  versus stroke versus TIA Patient will be admitted to inpatient stroke work-up in place Principal Problem:   Fall at home, initial encounter Active Problems:   Displaced spiral fracture of shaft of humerus, right arm, initial encounter for closed fracture   Diabetes mellitus ([place])   Essential hypertension   DVT prophylaxis: Lovenox  Code Status: Full  Family Communication: Sister at bedside  Disposition Plan:  To be determined  Consults called: Neurology and orthopedic  Admission status:  inpatient    Cristal Deer MD [facility] Pager [phone]  If 7PM-7AM, please contact night-coverage [email or web address] Password [facility]  07/06/2018, 6:08 PM

## 2018-07-06 NOTE — ED Provider Notes (Signed)
[facility] EMERGENCY DEPARTMENT Provider Note   CSN: [id number] Arrival date & time: 07/06/18  1118     History   Chief Complaint Chief Complaint  Patient presents with  . Fall  . Dizziness  . Arm Pain  . Head Injury    HPI Jocelyn Kaufman is a 62 y.o. female.  HPI Pt started feeling dizzy yesterday.  She did not feel right this am. Pt was walking around 10am when she suddenly felt hot, lightheaded and fell.  She is hit her washer with her right arm and then she hit the floor.   After the fall she did not feel like she was able to talk properly.  She was only able to say, "fell, need help" and could not complete sentences. That has subsequently resolved.  Pt has pain in her shoulder/arm and her arm feels weak.  SHe denies any other injuries.  Past Medical History:  Diagnosis Date  . Adrenal adenoma   . 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 Depression   . Diabetes mellitus without complication ([place])   . Hyperlipidemia   . Hypertension   . Hypothyroidism   . Meralgia paresthetica of right side 01/10/2017  . Migraine   . Pericardial effusion   . 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 Allergic rhinitis 07/04/2018  . Normochromic normocytic anemia 01/20/2018  . Rheumatoid arthritis ([place]) 04/30/2017  . Lesion of left humerus 04/29/2017  . Pericardial effusion 03/14/2017  . Diabetes mellitus ([place]) 03/14/2017  . Essential hypertension 03/14/2017  . Mixed hyperlipidemia 03/14/2017  . 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 Meralgia paresthetica of right side 01/10/2017  . Allergic rhinoconjunctivitis 07/09/2015  . Asthma, well controlled 07/09/2015  . Laryngopharyngeal reflux (LPR) 07/09/2015    Past Surgical History:  Procedure Laterality Date  . APPENDECTOMY    . CYST REMOVAL LEG Right    Removed from right thigh joint  . TONSILLECTOMY    . TOTAL VAGINAL HYSTERECTOMY    . UMBILICAL HERNIA  REPAIR       OB History   None      Home Medications    Prior to Admission medications   Medication Sig Start Date End Date Taking? Authorizing Provider  acetaminophen (TYLENOL) 500 MG tablet Take 1,000 mg by mouth every 6 (six) hours as needed for mild pain.   Yes [provider]  albuterol (PROVENTIL HFA;VENTOLIN HFA) 108 (90 Base) MCG/ACT inhaler Inhale two puffs every four to six hours as needed for cough or wheeze. Patient taking differently: Inhale 2 puffs into the lungs every 4 (four) hours as needed for wheezing (or cough).  07/04/18  Yes Ambs, Kathrine Cords, FNP  amLODipine (NORVASC) 5 MG tablet Take 5 mg by mouth daily. 06/04/16  Yes [provider]  Aspirin-Acetaminophen-Caffeine (EXCEDRIN MIGRAINE PO) Take 2 tablets by mouth as needed (migraine).    Yes [provider]  Biotin 1 MG CAPS Take 1 capsule by mouth daily.    Yes [provider]  cetirizine (ZYRTEC) 10 MG tablet Take 10 mg by mouth daily.   Yes [provider]  Cholecalciferol (VITAMIN D-3 PO) Take 50,000 Units by mouth every Monday.    Yes [provider]  Dulaglutide (TRULICITY) 1.5 PX/1.0GY SOPN Inject 1.5 mg into the skin once a week.    Yes [provider]  empagliflozin (JARDIANCE) 25 MG TABS tablet Take 25 mg by  mouth daily.  05/22/18  Yes [provider]  EPINEPHrine (EPIPEN 2-PAK) 0.3 mg/0.3 mL IJ SOAJ injection Use as directed for life-threatening allergic reaction. Patient taking differently: Inject 0.3 mg into the muscle once. for life-threatening allergic reaction. 08/21/16  Yes Kozlow, Donnamarie Poag, MD  escitalopram (LEXAPRO) 20 MG tablet Take 20 mg by mouth daily.   Yes [provider]  ferrous sulfate 325 (65 FE) MG tablet Take 365 mg by mouth daily.    Yes [provider]  folic acid (FOLVITE) 1 MG tablet Take 2 mg by mouth daily.   Yes [provider]  Golimumab ([place]) Inject into the skin.   Yes [provider]  hydrALAZINE (APRESOLINE) 25 MG tablet Take 25 mg by mouth 2 (two) times daily.   Yes [provider]  ibuprofen (ADVIL,MOTRIN) 200 MG tablet Take 200 mg by mouth every 6 (six) hours as needed for moderate pain.    Yes [provider]  Insulin Glargine (BASAGLAR KWIKPEN) 100 UNIT/ML SOPN Inject 60-66 Units into the skin daily.  08/01/16  Yes [provider]  irbesartan-hydrochlorothiazide (AVALIDE) 300-12.5 MG tablet Take 1 tablet by mouth daily.   Yes [provider]  levothyroxine (SYNTHROID) 125 MCG tablet Take 125 mcg by mouth daily before breakfast. BRAND ONLY   Yes [provider]  LORazepam (ATIVAN) 0.5 MG tablet Take 0.5 mg by mouth as needed for anxiety.   Yes [provider]  Magnesium 500 MG TABS Take 500 mg by mouth daily.    Yes [provider]  metaxalone (SKELAXIN) 800 MG tablet Take 400 mg by mouth daily as needed for muscle spasms.    Yes [provider]  metFORMIN (GLUCOPHAGE-XR) 500 MG 24 hr tablet Take 1,500 mg by mouth daily.    Yes [provider]  Methotrexate, Anti-Rheumatic, (METHOTREXATE, PF, [place]) Inject 0.8 Units into the skin once a week.   Yes [provider]  Multiple Vitamins-Minerals (CENTRUM SILVER PO) Take 1 tablet by mouth daily.   Yes [provider]  TRAVATAN Z 0.004 % SOLN ophthalmic solution Place 0.004 drops into both eyes daily. 07/21/16  Yes [provider]  vitamin B-12 (CYANOCOBALAMIN) 100 MCG tablet Take 100 mcg by mouth daily.    Yes [provider]    Family History Family History  Problem Relation Age of Onset  . Hypertension Mother   . Stroke Mother   . Kidney failure Mother   . Heart disease Mother        heart murmur  . Glaucoma Mother   . Stroke Father   . COPD Father   . Cancer - Prostate Father   . Colon cancer Father     Social History Social History   Tobacco Use  . Smoking status: Never Smoker  .  Smokeless tobacco: Never Used  Substance Use Topics  . Alcohol use: Yes    Comment: occ  . Drug use: No     Allergies   Invokana [canagliflozin]; Percocet [oxycodone-acetaminophen]; Prilosec [omeprazole]; Rogaine [minoxidil]; and Victoza [liraglutide]   Review of Systems Review of Systems  Constitutional: Negative for fever.  Cardiovascular: Negative for chest pain and palpitations.  Gastrointestinal: Negative for abdominal pain, nausea and vomiting.  Neurological: Positive for dizziness and weakness.       She has felt her body vibrating at times, primarily in her wrists as well as other parts of her body  All other systems reviewed and are negative.  Physical Exam Updated Vital Signs BP (!) 147/69   Pulse 79   Temp 98.6 F (37 C) (Oral)   Resp 13   Ht 1.615 m (5' 3.6")   Wt 104.4 kg   SpO2 100%   BMI 40.01 kg/m   Physical Exam  HENT:  Head: Normocephalic and atraumatic.  Right Ear: External ear normal.  Left Ear: External ear normal.  Eyes: Conjunctivae are normal. Right eye exhibits no discharge. Left eye exhibits no discharge. No scleral icterus.  Neck: Neck supple. No tracheal deviation present.  Cardiovascular: Normal rate, regular rhythm and intact distal pulses.  Pulmonary/Chest: Effort normal and breath sounds normal. No stridor. No respiratory distress. She has no wheezes. She has no rales.  Abdominal: Soft. Bowel sounds are normal. She exhibits no distension. There is no tenderness. There is no rebound and no guarding.  Musculoskeletal: She exhibits no edema.       Right shoulder: She exhibits decreased range of motion, tenderness and bony tenderness. She exhibits no swelling.       Left shoulder: She exhibits no tenderness, no bony tenderness and no swelling.       Right elbow: Normal.      Right wrist: She exhibits no tenderness, no bony tenderness and no swelling.       Left wrist: She exhibits no tenderness, no bony tenderness and no swelling.        Right hip: She exhibits normal range of motion, no tenderness, no bony tenderness and no swelling.       Left hip: She exhibits normal range of motion, no tenderness and no bony tenderness.       Right ankle: She exhibits no swelling. No tenderness.       Left ankle: She exhibits no swelling. No tenderness.       Cervical back: She exhibits no tenderness, no bony tenderness and no swelling.       Thoracic back: She exhibits no tenderness, no bony tenderness and no swelling.       Lumbar back: She exhibits no tenderness, no bony tenderness and no swelling.       Right upper arm: She exhibits tenderness and bony tenderness. She exhibits no swelling.  Neurological: She is alert. She has normal strength. No cranial nerve deficit (no facial droop, extraocular movements intact, no slurred speech) or sensory deficit. She exhibits normal muscle tone. She displays no seizure activity. Coordination normal.  5/5 Equal strength bilateral UE grip, 5/5 plantar flexion bilaterally; normal sensation in all extremities, no facial droop, extraocular movements intact, no slurred speech, no aphasia or dysarthria  Skin: Skin is warm and dry. No rash noted. She is not diaphoretic.  Psychiatric: She has a normal mood and affect.  Nursing note and vitals reviewed.    ED Treatments / Results  Labs (all labs ordered are listed, but only abnormal results are displayed) Labs Reviewed  BASIC METABOLIC PANEL - Abnormal; Notable for the following components:      Result Value   CO2 20 (*)    Glucose, Bld 219 (*)    BUN 31 (*)    Creatinine, Ser 1.31 (*)    GFR calc non Af Amer 43 (*)    GFR calc Af Amer 49 (*)    All other components within normal limits  CBC - Abnormal; Notable for the following components:   RBC 3.81 (*)    Hemoglobin 10.3 (*)    HCT 35.0 (*)    MCHC 29.4 (*)  All other components within normal limits  URINALYSIS, ROUTINE W REFLEX MICROSCOPIC - Abnormal; Notable for the following  components:   Glucose, UA >=500 (*)    Protein, ur 30 (*)    Bacteria, UA RARE (*)    All other components within normal limits  TROPONIN I  CBG MONITORING, ED    EKG EKG Interpretation  Date/Time:  Sunday July 06 2018 11:37:09 EDT Ventricular Rate:  79 PR Interval:  180 QRS Duration: 84 QT Interval:  384 QTC Calculation: 440 R Axis:   -39 Text Interpretation:  Normal sinus rhythm Left axis deviation Low voltage QRS Cannot rule out Anterior infarct , age undetermined Abnormal ECG No significant change since last tracing Confirmed by Dorie Rank [id number]) on 07/06/2018 12:23:22 PM   Radiology Dg Shoulder Right  Result Date: 07/06/2018 CLINICAL DATA:  Dizzy.  Fall.  Right arm and shoulder pain. EXAM: RIGHT SHOULDER - 2+ VIEW COMPARISON:  None. FINDINGS: An oblique midshaft right humerus fracture demonstrates at least 15 degrees angulation. Fracture demonstrates displacement of 1/2 the shaft width. Degenerative changes present at the shoulder without additional fractures. IMPRESSION: 1. Oblique fracture of the midshaft right humerus with moderate displacement and angulation. Electronically Signed   By: San Morelle M.D.   On: 07/06/2018 13:49   Ct Head Wo Contrast  Result Date: 07/06/2018 CLINICAL DATA:  Fall.  Dizziness. EXAM: CT HEAD WITHOUT CONTRAST TECHNIQUE: Contiguous axial images were obtained from the base of the skull through the vertex without intravenous contrast. COMPARISON:  None. FINDINGS: Brain: No evidence of acute infarction, hemorrhage, hydrocephalus, extra-axial collection or mass lesion/mass effect. Vascular: Calcified atherosclerosis in the intracranial carotids. Skull: Normal. Negative for fracture or focal lesion. Sinuses/Orbits: No acute finding. Other: None. IMPRESSION: No acute intracranial abnormalities. Electronically Signed   By: Dorise Bullion III M.D   On: 07/06/2018 13:28   Dg Humerus Right  Result Date: 07/06/2018 CLINICAL DATA:  Fall.  Right upper  arm pain.  Initial encounter. EXAM: RIGHT HUMERUS - 2+ VIEW COMPARISON:  None. FINDINGS: A spiral fracture of the humeral diaphysis is seen with medial displacement and angulation of the distal fracture fragment. Generalized osteopenia noted. IMPRESSION: Displaced spiral fracture of the humeral diaphysis. Electronically Signed   By: Earle Gell M.D.   On: 07/06/2018 13:49    Procedures Procedures (including critical care time)  Medications Ordered in ED Medications  0.9 %  sodium chloride infusion ( Intravenous New Bag/Given 07/06/18 1236)  morphine 4 MG/ML injection 4 mg (4 mg Intravenous Given 07/06/18 1232)  ondansetron (ZOFRAN) injection 4 mg (4 mg Intravenous Given 07/06/18 1232)  morphine 4 MG/ML injection 4 mg (4 mg Intravenous Given 07/06/18 1334)  morphine 4 MG/ML injection 4 mg (4 mg Intravenous Given 07/06/18 1456)     Initial Impression / Assessment and Plan / ED Course  I have reviewed the triage vital signs and the nursing notes.  Pertinent labs & imaging results that were available during my care of the patient were reviewed by me and considered in my medical decision making (see chart for details).  Clinical Course as of Jul 07 1531  Sun Jul 06, 2018  1530 X-rays notable for humerus fracture.  Discussed with Dr Griffin Basil. Coaptation splint was applied.  Will see pt in hospital tomorrow   [JK]  1531 Mild renal insufficiency noted  Basic metabolic panel(!) [JK]  3818 Stable anemia  CBC(!) [JK]    Clinical Course User Index [JK] Dorie Rank, MD  Patient presented to  the emergency room for evaluation of arm injury after a syncopal episode.  Initial ED work-up does not reveal the etiology of her syncopal episode.  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  This does not seem to be severe enough to cause her to have a syncopal episode.  No evidence of any cardiac dysrhythmias in the ED.  Patient clearly had a pretty significant event causing  her fracture humerus.  I think is reasonable to bring her in for observation and cardiac monitoring.  I have consulted with orthopedics regarding her arm injury.  Initial plan is for a coaptation splint.  This was applied by orthopedic tech.  Patient tolerated well.  Final Clinical Impressions(s) / ED Diagnoses   Final diagnoses:  Syncope, unspecified syncope type  Closed displaced spiral fracture of shaft of right humerus, initial encounter      Dorie Rank, MD 07/06/18 [id number]

## 2018-07-06 NOTE — ED Notes (Signed)
Patient transported to CT 

## 2018-07-06 NOTE — Progress Notes (Signed)
Orthopedic Tech Progress Note Patient Details:  Jocelyn Kaufman 01-18-1956 [id number]  Ortho Devices Type of Ortho Device: Coapt, Arm sling Ortho Device/Splint Interventions: Application   Post Interventions Patient Tolerated: Well Instructions Provided: Care of device   [place] Pink 07/06/2018, 2:30 PM

## 2018-07-06 NOTE — ED Triage Notes (Signed)
Pt. Stated, I fell this morning, not sure why, Ive been dizzy. I hit my head, my right side, my rt. Arm.

## 2018-07-07 ENCOUNTER — Inpatient Hospital Stay (HOSPITAL_COMMUNITY): Payer: BC Managed Care – PPO

## 2018-07-07 DIAGNOSIS — E1149 Type 2 diabetes mellitus with other diabetic neurological complication: Secondary | ICD-10-CM

## 2018-07-07 DIAGNOSIS — E249 Cushing's syndrome, unspecified: Secondary | ICD-10-CM

## 2018-07-07 DIAGNOSIS — I10 Essential (primary) hypertension: Secondary | ICD-10-CM

## 2018-07-07 DIAGNOSIS — I5030 Unspecified diastolic (congestive) heart failure: Secondary | ICD-10-CM

## 2018-07-07 DIAGNOSIS — I951 Orthostatic hypotension: Secondary | ICD-10-CM

## 2018-07-07 LAB — LIPID PANEL
Cholesterol: 221 mg/dL — ABNORMAL HIGH (ref 0–200)
HDL: 42 mg/dL (ref >40)
LDL Cholesterol: 126 mg/dL — ABNORMAL HIGH (ref 0–99)
Total CHOL/HDL Ratio: 5.3 RATIO
Triglycerides: 266 mg/dL — ABNORMAL HIGH (ref <150)
VLDL: 53 mg/dL — ABNORMAL HIGH (ref 0–40)

## 2018-07-07 LAB — COMPREHENSIVE METABOLIC PANEL
ALT: 47 U/L — ABNORMAL HIGH (ref 0–44)
AST: 30 U/L (ref 15–41)
Albumin: 3.3 g/dL — ABNORMAL LOW (ref 3.5–5.0)
Alkaline Phosphatase: 60 U/L (ref 38–126)
Anion gap: 9 (ref 5–15)
BUN: 27 mg/dL — ABNORMAL HIGH (ref 8–23)
CO2: 20 mmol/L — ABNORMAL LOW (ref 22–32)
Calcium: 9 mg/dL (ref 8.9–10.3)
Chloride: 110 mmol/L (ref 98–111)
Creatinine, Ser: 1.2 mg/dL — ABNORMAL HIGH (ref 0.44–1.00)
GFR calc Af Amer: 55 mL/min — ABNORMAL LOW (ref >60)
GFR calc non Af Amer: 47 mL/min — ABNORMAL LOW (ref >60)
Glucose, Bld: 132 mg/dL — ABNORMAL HIGH (ref 70–99)
Potassium: 4.6 mmol/L (ref 3.5–5.1)
Sodium: 139 mmol/L (ref 135–145)
Total Bilirubin: 0.7 mg/dL (ref 0.3–1.2)
Total Protein: 6 g/dL — ABNORMAL LOW (ref 6.5–8.1)

## 2018-07-07 LAB — ECHOCARDIOGRAM COMPLETE
Height: 64 in
Weight: 3781.33 oz

## 2018-07-07 LAB — GLUCOSE, CAPILLARY
Glucose-Capillary: 135 mg/dL — ABNORMAL HIGH (ref 70–99)
Glucose-Capillary: 209 mg/dL — ABNORMAL HIGH (ref 70–99)
Glucose-Capillary: 134 mg/dL — ABNORMAL HIGH (ref 70–99)
Glucose-Capillary: 200 mg/dL — ABNORMAL HIGH (ref 70–99)

## 2018-07-07 LAB — HIV ANTIBODY (ROUTINE TESTING W REFLEX): HIV Screen 4th Generation wRfx: NONREACTIVE

## 2018-07-07 MED ORDER — VITAMIN D (ERGOCALCIFEROL) 1.25 MG (50000 UNIT) PO CAPS
50000.0000 [IU] | ORAL_CAPSULE | ORAL | Status: DC
  Administered 2018-07-07: 50000 [IU] via ORAL
  Filled 2018-07-07 (×2): qty 1

## 2018-07-07 MED ORDER — ATORVASTATIN CALCIUM 40 MG PO TABS
40.0000 mg | ORAL_TABLET | Freq: Every day | ORAL | Status: DC
  Administered 2018-07-07 – 2018-07-12 (×6): 40 mg via ORAL
  Filled 2018-07-07 (×6): qty 1

## 2018-07-07 MED ORDER — ATORVASTATIN CALCIUM 10 MG PO TABS: 20.0000 mg | ORAL_TABLET | Freq: Every day | ORAL | Status: DC

## 2018-07-07 NOTE — Evaluation (Signed)
Speech Language Pathology Evaluation Patient Details Name: Jocelyn Kaufman MRN: [id number] DOB: 11-Feb-1956 Today's Date: 07/07/2018 Time: 1205-1230 SLP Time Calculation (min) (ACUTE ONLY): 25 min  Problem List:  Patient Active Problem List   Diagnosis Date Noted  . Fall at home, initial encounter 07/06/2018  . Displaced spiral fracture of shaft of humerus, right arm, initial encounter for closed fracture 07/06/2018  . TIA (transient ischemic attack) 07/06/2018  . Allergic rhinitis 07/04/2018  . Normochromic normocytic anemia 01/20/2018  . Rheumatoid arthritis ([place]) 04/30/2017  . Lesion of left humerus 04/29/2017  . Pericardial effusion 03/14/2017  . Diabetes mellitus ([place]) 03/14/2017  . Essential hypertension 03/14/2017  . Mixed hyperlipidemia 03/14/2017  . 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 Meralgia paresthetica of right side 01/10/2017  . Allergic rhinoconjunctivitis 07/09/2015  . Asthma, well controlled 07/09/2015  . Laryngopharyngeal reflux (LPR) 07/09/2015   Past Medical History:  Past Medical History:  Diagnosis Date  . Adrenal adenoma   . 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 Depression   . Diabetes mellitus without complication ([place])   . Hyperlipidemia   . Hypertension   . Hypothyroidism   . Meralgia paresthetica of right side 01/10/2017  . Migraine   . Pericardial effusion   . 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 APPENDECTOMY    . CYST REMOVAL LEG Right    Removed from right thigh joint  . TONSILLECTOMY    . TOTAL VAGINAL HYSTERECTOMY    . UMBILICAL HERNIA REPAIR     HPI:  Patient is a 62 y/o female who presents s/p fall from home. Also with dizziness and speech difficulties. XRAY of humerus-Displaced spiral fracture of the humeral diaphysis. Head CT, MRI-unremarkable. PMH: DM, HTN, HLD, Depression, Cushings disease   Assessment / Plan / Recommendation Clinical Impression  Pt  presents with normal cognitive-linguistic function and no deficits with expressive/receptive language noted. Pt states that expressive deficits have resolved. Education provided and no further ST services are indicated at this time. ST to sign off.     SLP Assessment  SLP Recommendation/Assessment: Patient does not need any further Speech Lanaguage Pathology Services SLP Visit Diagnosis: Cognitive communication deficit (R41.841)    Follow Up Recommendations  None    Frequency and Duration           SLP Evaluation Cognition  Overall Cognitive Status: Within Functional Limits for tasks assessed Arousal/Alertness: Awake/alert Orientation Level: Oriented X4       Comprehension  Auditory Comprehension Overall Auditory Comprehension: Appears within functional limits for tasks assessed Visual Recognition/Discrimination Discrimination: Within Function Limits Reading Comprehension Reading Status: Within funtional limits    Expression Expression Primary Mode of Expression: Verbal Verbal Expression Overall Verbal Expression: Appears within functional limits for tasks assessed Written Expression Dominant Hand: Right Written Expression: Not tested   Oral / Motor  Motor Speech Overall Motor Speech: Appears within functional limits for tasks assessed   GO                    Ruxin Ransome 07/07/2018, 4:41 PM

## 2018-07-07 NOTE — Evaluation (Signed)
Physical Therapy Evaluation Patient Details Name: Jocelyn Kaufman MRN: [id number] DOB: 05-26-1956 Today's Date: 07/07/2018   History of Present Illness  Patient is a 62 y/o frmale who presents s/p fall from home. Also with dizziness and speech difficulties. XRAY of humerus-Displaced spiral fracture of the humeral diaphysis. Head CT, MRI-unremarkable. PMH: DM, HTN, HLD, Depression, Cushings disease.   Clinical Impression  Patient presents with RUE pain, reported "foggyness," dizziness and impaired mobility s/p above. Tolerated bed mobility, transfers and gait training with Min-Mod A for safety. Increased time to perform all movements. Pt lives alone and Mod I PTA. Currently not able to use RUE functionally due to fx- made NWB even though no orders in chart. May requires vestibular evaluation per MD order however will be limited due to pain in RUE. Pt not able to manage self care at his time. Would benefit from SNF to maximize independence and mobility prior to return home. Will follow acutely.     Follow Up Recommendations SNF;Supervision for mobility/OOB    Equipment Recommendations  Other (comment)(defer)    Recommendations for Other Services       Precautions / Restrictions Precautions Precautions: Fall Precaution Comments: dizziness Required Braces or Orthoses: Sling Restrictions Weight Bearing Restrictions: Yes RUE Weight Bearing: Non weight bearing Other Position/Activity Restrictions: no WB status in chart but documented fx so kept NWB in sling      Mobility  Bed Mobility Overal bed mobility: Needs Assistance Bed Mobility: Supine to Sit     Supine to sit: Mod assist;HOB elevated     General bed mobility comments: Able tobring LEs to EOB but assist needed to elevate trunk, increased time and pain in RUE.   Transfers Overall transfer level: Needs assistance Equipment used: 1 person hand held assist Transfers: Sit to/from Stand Sit to Stand: Min assist         General  transfer comment: Assist to power to standing with lots of cues, initially does not want help. Increased time, able to stand with assist. Unsteady.  Ambulation/Gait Ambulation/Gait assistance: Min assist Gait Distance (Feet): 40 Feet Assistive device: 1 person hand held assist Gait Pattern/deviations: Step-through pattern;Step-to pattern;Decreased stride length;Wide base of support Gait velocity: decreased   General Gait Details: Slow, unsteady gait with 2 instances of LOB with Min A to correct. reports foggy feeling.   Stairs            Wheelchair Mobility    Modified Rankin (Stroke Patients Only) Modified Rankin (Stroke Patients Only) Pre-Morbid Rankin Score: Slight disability Modified Rankin: Moderately severe disability     Balance Overall balance assessment: Needs assistance Sitting-balance support: Feet supported;No upper extremity supported Sitting balance-Leahy Scale: Good     Standing balance support: During functional activity Standing balance-Leahy Scale: Poor Standing balance comment: Requires at least 1 UE support esp due to dizziness.                              Pertinent Vitals/Pain Pain Assessment: Faces Faces Pain Scale: Hurts whole lot Pain Location: right arm Pain Descriptors / Indicators: Grimacing;Guarding;Aching;Discomfort Pain Intervention(s): Monitored during session;Repositioned;RN gave pain meds during session;Limited activity within patient's tolerance    Home Living Family/patient expects to be discharged to:: Private residence Living Arrangements: Alone Available Help at Discharge: Family Type of Home: House Home Access: Stairs to enter Entrance Stairs-Rails: Right Entrance Stairs-Number of Steps: 2 Home Layout: One level Home Equipment: Cane - single point  Prior Function Level of Independence: Independent with assistive device(s)         Comments: Uses SPC PRN for ambulation.      Hand Dominance    Dominant Hand: Right(but uses left hand as well for certain things)    Extremity/Trunk Assessment   Upper Extremity Assessment Upper Extremity Assessment: Defer to OT evaluation;RUE deficits/detail RUE Deficits / Details: Swelling in hand/digits.  RUE: Unable to fully assess due to pain;Unable to fully assess due to immobilization    Lower Extremity Assessment Lower Extremity Assessment: Generalized weakness       Communication   Communication: No difficulties  Cognition Arousal/Alertness: Awake/alert Behavior During Therapy: WFL for tasks assessed/performed Overall Cognitive Status: No family/caregiver present to determine baseline cognitive functioning                                 General Comments: Irritated with being in the hospital. "no one has any answers, 3 peopel come in here and all asking me questions, it is not right." Reports feeling foggy headed.       General Comments      Exercises     Assessment/Plan    PT Assessment Patient needs continued PT services  PT Problem List Decreased strength;Decreased mobility;Decreased range of motion;Decreased activity tolerance;Decreased balance;Pain;Decreased skin integrity       PT Treatment Interventions Functional mobility training;Balance training;Patient/family education;Gait training;Therapeutic activities;Therapeutic exercise;Stair training;Neuromuscular re-education    PT Goals (Current goals can be found in the Care Plan section)  Acute Rehab PT Goals Patient Stated Goal: to feel better and get some answers PT Goal Formulation: With patient Time For Goal Achievement: 07/21/18 Potential to Achieve Goals: Good    Frequency Min 3X/week   Barriers to discharge Decreased caregiver support lives alone    Co-evaluation               AM-PAC PT "6 Clicks" Daily Activity  Outcome Measure Difficulty turning over in bed (including adjusting bedclothes, sheets and blankets)?:  Unable Difficulty moving from lying on back to sitting on the side of the bed? : Unable Difficulty sitting down on and standing up from a chair with arms (e.g., wheelchair, bedside commode, etc,.)?: Unable Help needed moving to and from a bed to chair (including a wheelchair)?: A Little Help needed walking in hospital room?: A Little Help needed climbing 3-5 steps with a railing? : A Lot 6 Click Score: 11    End of Session Equipment Utilized During Treatment: Other (comment);Gait belt(sling RUE) Activity Tolerance: Patient limited by pain;Patient tolerated treatment well Patient left: in bed;with call bell/phone within reach(sitting EOB) Nurse Communication: Mobility status;Weight bearing status PT Visit Diagnosis: Unsteadiness on feet (R26.81);Pain;Difficulty in walking, not elsewhere classified (R26.2) Pain - Right/Left: Right Pain - part of body: Arm    Time: 7989-2119 PT Time Calculation (min) (ACUTE ONLY): 35 min   Charges:   PT Evaluation $PT Eval Moderate Complexity: 1 Mod PT Treatments $Therapeutic Activity: 8-22 mins        Wray Kearns, PT, DPT Acute Rehabilitation Services Pager [phone] Office [phone]      Marguarite Arbour A Sabra Heck 07/07/2018, 9:19 AM

## 2018-07-07 NOTE — Consult Note (Signed)
ORTHOPAEDIC CONSULTATION  REQUESTING PHYSICIAN: Purohit, Konrad Dolores, MD  Chief Complaint: Right humerus fracture  HPI: Jocelyn Kaufman is a 62 y.o. female with syncopal episode and work-up for stroke who had a fall resulting in a right humerus fracture.  Patient states that she was somewhat dizzy prior and fell in the arm.  She had no pain in the arm.  She is being admitted for a work-up for stroke and has multiple other medical comorbidities including diabetes rheumatoid arthritis on methotrexate and adrenal insufficiency.  Patient is right-hand dominant.  Past Medical History:  Diagnosis Date  . Adrenal adenoma   . 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 Depression   . Diabetes mellitus without complication ([place])   . Hyperlipidemia   . Hypertension   . Hypothyroidism   . Meralgia paresthetica of right side 01/10/2017  . Migraine   . Pericardial effusion   . 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 APPENDECTOMY    . CYST REMOVAL LEG Right    Removed from right thigh joint  . TONSILLECTOMY    . TOTAL VAGINAL HYSTERECTOMY    . UMBILICAL HERNIA REPAIR     Social History   Socioeconomic History  . Marital status: Single    Spouse name: Not on file  . Number of children: 0  . Years of education: Masters  . Highest education level: Not on file  Occupational History  . Occupation: [employer]  Social Needs  . Financial resource strain: Not on file  . Food insecurity:    Worry: Not on file    Inability: Not on file  . Transportation needs:    Medical: Not on file    Non-medical: Not on file  Tobacco Use  . Smoking status: Never Smoker  . Smokeless tobacco: Never Used  Substance and Sexual Activity  . Alcohol use: Yes    Comment: occ  . Drug use: No  . Sexual activity: Never  Lifestyle  . Physical activity:    Days per week: Not on file    Minutes per session: Not on file  . Stress: Not on file  Relationships   . Social connections:    Talks on phone: Not on file    Gets together: Not on file    Attends religious service: Not on file    Active member of club or organization: Not on file    Attends meetings of clubs or organizations: Not on file    Relationship status: Not on file  Other Topics Concern  . Not on file  Social History Narrative   Lives alone   Caffeine use: 2 cups coffee per day   Tea sometimes   Soda- 3 x/week   Right-handed   Family History  Problem Relation Age of Onset  . Hypertension Mother   . Stroke Mother   . Kidney failure Mother   . Heart disease Mother        heart murmur  . Glaucoma Mother   . Stroke Father   . COPD Father   . Cancer - Prostate Father   . Colon cancer Father    Allergies  Allergen Reactions  . Invokana [Canagliflozin] Other (See Comments)    Urinary incontinence  . Percocet [Oxycodone-Acetaminophen] Nausea And Vomiting  . Prilosec [Omeprazole] Rash  . Rogaine [Minoxidil] Rash  . Victoza [Liraglutide] Itching   Prior to Admission medications   Medication Sig Start  Date End Date Taking? Authorizing Provider  acetaminophen (TYLENOL) 500 MG tablet Take 1,000 mg by mouth every 6 (six) hours as needed for mild pain.   Yes [provider]  albuterol (PROVENTIL HFA;VENTOLIN HFA) 108 (90 Base) MCG/ACT inhaler Inhale two puffs every four to six hours as needed for cough or wheeze. Patient taking differently: Inhale 2 puffs into the lungs every 4 (four) hours as needed for wheezing (or cough).  07/04/18  Yes Ambs, Kathrine Cords, FNP  amLODipine (NORVASC) 5 MG tablet Take 5 mg by mouth daily. 06/04/16  Yes [provider]  Aspirin-Acetaminophen-Caffeine (EXCEDRIN MIGRAINE PO) Take 2 tablets by mouth as needed (migraine).    Yes [provider]  Biotin 1 MG CAPS Take 1 capsule by mouth daily.    Yes [provider]  cetirizine (ZYRTEC) 10 MG tablet Take 10 mg by mouth daily.   Yes [provider]    Cholecalciferol (VITAMIN D-3 PO) Take 50,000 Units by mouth every Monday.    Yes [provider]  Dulaglutide (TRULICITY) 1.5 DE/0.8XK SOPN Inject 1.5 mg into the skin once a week.    Yes [provider]  empagliflozin (JARDIANCE) 25 MG TABS tablet Take 25 mg by mouth daily.  05/22/18  Yes [provider]  EPINEPHrine (EPIPEN 2-PAK) 0.3 mg/0.3 mL IJ SOAJ injection Use as directed for life-threatening allergic reaction. Patient taking differently: Inject 0.3 mg into the muscle once. for life-threatening allergic reaction. 08/21/16  Yes Kozlow, Donnamarie Poag, MD  escitalopram (LEXAPRO) 20 MG tablet Take 20 mg by mouth daily.   Yes [provider]  ferrous sulfate 325 (65 FE) MG tablet Take 365 mg by mouth daily.    Yes [provider]  folic acid (FOLVITE) 1 MG tablet Take 2 mg by mouth daily.   Yes [provider]  Golimumab (Exline [place]) Inject into the skin.   Yes [provider]  hydrALAZINE (APRESOLINE) 25 MG tablet Take 25 mg by mouth 2 (two) times daily.   Yes [provider]  ibuprofen (ADVIL,MOTRIN) 200 MG tablet Take 200 mg by mouth every 6 (six) hours as needed for moderate pain.    Yes [provider]  Insulin Glargine (BASAGLAR KWIKPEN) 100 UNIT/ML SOPN Inject 60-66 Units into the skin daily.  08/01/16  Yes [provider]  irbesartan-hydrochlorothiazide (AVALIDE) 300-12.5 MG tablet Take 1 tablet by mouth daily.   Yes [provider]  levothyroxine (SYNTHROID) 125 MCG tablet Take 125 mcg by mouth daily before breakfast. BRAND ONLY   Yes [provider]  LORazepam (ATIVAN) 0.5 MG tablet Take 0.5 mg by mouth as needed for anxiety.   Yes [provider]  Magnesium 500 MG TABS Take 500 mg by mouth daily.    Yes [provider]  metaxalone (SKELAXIN) 800 MG tablet Take 400 mg by mouth daily as needed for muscle spasms.    Yes [provider]  metFORMIN (GLUCOPHAGE-XR)  500 MG 24 hr tablet Take 1,500 mg by mouth daily.    Yes [provider]  Methotrexate, Anti-Rheumatic, (METHOTREXATE, PF, [place]) Inject 0.8 Units into the skin once a week.   Yes [provider]  Multiple Vitamins-Minerals (CENTRUM SILVER PO) Take 1 tablet by mouth daily.   Yes [provider]  TRAVATAN Z 0.004 % SOLN ophthalmic solution Place 0.004 drops into both eyes daily. 07/21/16  Yes [provider]  vitamin B-12 (CYANOCOBALAMIN) 100 MCG tablet Take 100 mcg by mouth daily.  Yes [provider]   Dg Chest 2 View  Result Date: 07/06/2018 CLINICAL DATA:  Acute RIGHT chest pain following fall. Initial encounter. EXAM: CHEST - 2 VIEW COMPARISON:  04/05/2017 and prior radiographs FINDINGS: Mild cardiomegaly again noted. There is no evidence of focal airspace disease, pulmonary edema, suspicious pulmonary nodule/mass, pleural effusion, or pneumothorax. No acute bony abnormalities are identified. IMPRESSION: Mild cardiomegaly without evidence of acute cardiopulmonary disease. Electronically Signed   By: Margarette Canada M.D.   On: 07/06/2018 21:34   Dg Shoulder Right  Result Date: 07/06/2018 CLINICAL DATA:  Dizzy.  Fall.  Right arm and shoulder pain. EXAM: RIGHT SHOULDER - 2+ VIEW COMPARISON:  None. FINDINGS: An oblique midshaft right humerus fracture demonstrates at least 15 degrees angulation. Fracture demonstrates displacement of 1/2 the shaft width. Degenerative changes present at the shoulder without additional fractures. IMPRESSION: 1. Oblique fracture of the midshaft right humerus with moderate displacement and angulation. Electronically Signed   By: San Morelle M.D.   On: 07/06/2018 13:49   Ct Head Wo Contrast  Result Date: 07/06/2018 CLINICAL DATA:  Fall.  Dizziness. EXAM: CT HEAD WITHOUT CONTRAST TECHNIQUE: Contiguous axial images were obtained from the base of the skull through the vertex without intravenous contrast. COMPARISON:  None. FINDINGS:  Brain: No evidence of acute infarction, hemorrhage, hydrocephalus, extra-axial collection or mass lesion/mass effect. Vascular: Calcified atherosclerosis in the intracranial carotids. Skull: Normal. Negative for fracture or focal lesion. Sinuses/Orbits: No acute finding. Other: None. IMPRESSION: No acute intracranial abnormalities. Electronically Signed   By: Dorise Bullion III M.D   On: 07/06/2018 13:28   Mr Brain Wo Contrast  Result Date: 07/06/2018 CLINICAL DATA:  Dizziness EXAM: MRI HEAD WITHOUT CONTRAST MRA HEAD WITHOUT CONTRAST TECHNIQUE: Multiplanar, multiecho pulse sequences of the brain and surrounding structures were obtained without intravenous contrast. Angiographic images of the head were obtained using MRA technique without contrast. COMPARISON:  Head CT 07/06/2018 FINDINGS: MRI HEAD FINDINGS BRAIN: There is no acute infarct, acute hemorrhage or mass effect. Partially empty sella. There are no old infarcts. The white matter signal is normal for the patient's age. The CSF spaces are normal for age, with no hydrocephalus. Susceptibility-sensitive sequences show no chronic microhemorrhage or superficial siderosis. SKULL AND UPPER CERVICAL SPINE: The visualized skull base, calvarium, upper cervical spine and extracranial soft tissues are normal. SINUSES/ORBITS: No fluid levels or advanced mucosal thickening. No mastoid or middle ear effusion. The orbits are normal. MRA HEAD FINDINGS Intracranial internal carotid arteries: Moderate-to-severe stenosis of the cavernous segment of the right ICA. Anterior cerebral arteries: Normal. Middle cerebral arteries: Normal. Posterior communicating arteries: Present bilaterally. Posterior cerebral arteries: Normal. Basilar artery: Normal. Vertebral arteries: Left dominant. Normal. Superior cerebellar arteries: Normal. Inferior cerebellar arteries: Normal. IMPRESSION: 1. No acute intracranial abnormality. Normal appearance of the brain for age. 2. 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 Otherwise normal intracranial MRA. Electronically Signed   By: Ulyses Jarred M.D.   On: 07/06/2018 22:26   Dg Humerus Right  Result Date: 07/06/2018 CLINICAL DATA:  Fall.  Right upper arm pain.  Initial encounter. EXAM: RIGHT HUMERUS - 2+ VIEW COMPARISON:  None. FINDINGS: A spiral fracture of the humeral diaphysis is seen with medial displacement and angulation of the distal fracture fragment. Generalized osteopenia noted. IMPRESSION: Displaced spiral fracture of the humeral diaphysis. Electronically Signed   By: Earle Gell M.D.   On: 07/06/2018 13:49   Mr Jodene Nam Head Wo Contrast  Result Date: 07/06/2018 CLINICAL DATA:  Dizziness EXAM: MRI HEAD WITHOUT CONTRAST MRA HEAD WITHOUT CONTRAST TECHNIQUE: Multiplanar, multiecho pulse sequences of the brain and surrounding structures were obtained without intravenous contrast. Angiographic images of the head were obtained using MRA technique without contrast. COMPARISON:  Head CT 07/06/2018 FINDINGS: MRI HEAD FINDINGS BRAIN: There is no acute infarct, acute hemorrhage or mass effect. Partially empty sella. There are no old infarcts. The white matter signal is normal for the patient's age. The CSF spaces are normal for age, with no hydrocephalus. Susceptibility-sensitive sequences show no chronic microhemorrhage or superficial siderosis. SKULL AND UPPER CERVICAL SPINE: The visualized skull base, calvarium, upper cervical spine and extracranial soft tissues are normal. SINUSES/ORBITS: No fluid levels or advanced mucosal thickening. No mastoid or middle ear effusion. The orbits are normal. MRA HEAD FINDINGS Intracranial internal carotid arteries: Moderate-to-severe stenosis of the cavernous segment of the right ICA. Anterior cerebral arteries: Normal. Middle cerebral arteries: Normal. Posterior communicating arteries: Present bilaterally. Posterior cerebral arteries: Normal. Basilar artery: Normal. Vertebral  arteries: Left dominant. Normal. Superior cerebellar arteries: Normal. Inferior cerebellar arteries: Normal. IMPRESSION: 1. No acute intracranial abnormality. Normal appearance of the brain for age. 2. 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 Otherwise normal intracranial MRA. Electronically Signed   By: Ulyses Jarred M.D.   On: 07/06/2018 22:26   Family History Reviewed and non-contributory, no pertinent history of problems with bleeding or anesthesia      Review of Systems 14 system ROS conducted and negative except for that noted in HPI   OBJECTIVE  Vitals: Patient Vitals for the past 8 hrs:  BP Temp Temp src Pulse Resp SpO2  07/07/18 0444 - 99.2 F (37.3 C) Oral - - -  07/07/18 0020 135/78 98.6 F (37 C) Oral 71 16 96 %  07/06/18 2341 140/68 99.6 F (37.6 C) Oral 72 18 100 %   General: Alert, no acute distress Cardiovascular: No pedal edema Respiratory: No cyanosis, no use of accessory musculature GI: No organomegaly, abdomen is soft and non-tender Skin: No lesions in the area of chief complaint other than those listed below in MSK exam.  Neurologic: Sensation intact distally save for the below mentioned MSK exam Psychiatric: Patient is competent for consent with normal mood and affect Lymphatic: No axillary or cervical lymphadenopathy Extremities  RUE: Coaptation splint in place, radial, median, ulnar motor function distally preserved the patient reports some numbness in her fingers diffusely.  Unable to check range of motion of the elbow or shoulder secondary to fracture.  Warm well perfused hand.  Patient's arm is relatively large due to body habitus.    Test Results Imaging X-rays of the humerus reviewed prior to splinting demonstrating almost 30 degrees of varus alignment with a spiral oblique midshaft humerus fracture.  Labs cbc Recent Labs    07/06/18 1144  WBC 5.9  HGB 10.3*  HCT 35.0*  PLT 215    Labs inflam No  results for input(s): CRP in the last 72 hours.  Invalid input(s): ESR  Labs coag No results for input(s): INR, PTT in the last 72 hours.  Invalid input(s): PT  Recent Labs    07/06/18 1144 07/07/18 0514  NA 138 139  K 5.1 4.6  CL 107 110  CO2 20* 20*  GLUCOSE 219* 132*  BUN 31* 27*  CREATININE 1.31* 1.20*  CALCIUM 9.6 9.0     ASSESSMENT AND PLAN: 62 y.o. female with the following: Right humeral shaft fracture spiral oblique with angulation  Long discussion with  the patient regarding her options.  Her body habitus makes splinting difficult but her morbidities make her wrist from surgery somewhat higher than others.  She initially needs a work-up for any acute stroke or other pathology prior to talking about surgical management but if she has no other medical concerns she would potentially be a candidate for surgery.  And we talked about the risk benefits and alternatives of ORIF of the humerus she elected to proceed as long as it is medically appropriate.  Depending on the medical team's work-up will make final decision.  If she is still an inpatient on Wednesday we can perform the surgery then if she is medically optimized otherwise she can be discharged and have outpatient surgery.  We will check in on her progress again before making decision.  The risks benefits and alternatives were discussed with the patient including but not limited to the risks of nonoperative treatment, versus surgical intervention including infection, bleeding, nerve injury, periprosthetic fracture, the need for revision surgery,blood clots, cardiopulmonary complications, morbidity, mortality, among others, and they were willing to proceed.    Specific risks of radial nerve injury and wound healing would be elevated in this patient as well as nonunion and she understands this.

## 2018-07-07 NOTE — Progress Notes (Addendum)
[facility] - DAILY PROGRESS NOTE    HISTORY Jocelyn Kaufman is an 62 y.o. female with PMH of HTN, HLD, DM,  Paresthesias, OSA, cushing's disease who presented to [facility] ED after a fall where she was dizzy. She also noted arm pain and hit her head.   Patient stated that around noon yesterday 07/06/18 she felt dizzy. She described the dizziness as she felt like she was spinning.  Denies any diplopia or other vision changes.  She reports that the  dizziness would happen when she was sitting or when she was standing.  She went to bed last night and woke up twice in the middle of the night to urinate. She reports that she was feeling unsteady when walking to the bathroom and this held on to things. She decided she did not feel well enough to go to church today. She stayed home and watched the church service on the computer. While watching she had a sudden hot flash. She decided to go and try and get some water, but on the way to the kitchen she fell. She hit her right arm on the washer, and she did hit her head on the floor. She is unsure if she ever loss consciousness. She could not get off the floor but was able to pull things down and get to her cell phone. She called her sister. When speaking with her sister she was unable to form complete sentences. She could say things like " need help" fell to floor"  "arm broken". Sister Jocelyn Kaufman ( who she could only manage to call her phyll) transported her to the hospital. Her speech difficulties have resolved. Right arm is in a sling and ace bandage. Denies HA, N/V, CP, SOB, numbness or tingling ( outside of her normal paresthesias), ETOH use, drugs or smoking history,  ED course:  CT Head: no acute abnormality No prior history of stroke.   Date last known well: Date: 07/06/2018 Time last known well: Unable to determine tPA Given: No: contraindicated ; outside of window   Modified Rankin:  Rankin Score=0  NIHSS:0   SUBJECTIVE Patient seen in bed, awake alert oriented x3 no acute distress.  Patient denies dysarthria or slurred speech.  Admits to intermittent dizziness which has been occurring for an extended period of time she can date instances last year where she was dizzy and was hospitalized due to that.  She states symptoms are not only associated with getting up, and did not have any precursors.   She describes her dizziness as her head being in a boat, deneisassociated tinnitus, but admits to hx of ear pain over the last several month.  She saw her allergist who evaluated her ear last weeks and states everything was fine. She admits to nausea without emesis denies chest pain, shortness of breath.  Admits to unsteady gait as she has not been able to balance since her right arm has been placed in a sling.  Plan of care for the day discussed with patient who verbalized understanding.  OBJECTIVE Most recent Vital Signs: Vitals:   07/07/18 0020 07/07/18 0444 07/07/18 0715 07/07/18 1204  BP: 135/78  124/67 (!) 117/94  Pulse: 71  69 69  Resp: 16  16 13   Temp: 98.6 F (37 C) 99.2 F (37.3 C)  98.2 F (36.8 C)  TempSrc: Oral Oral  Oral  SpO2: 96%  96% 100%  Weight:      Height:       CBG (last 3)  Recent Labs    07/06/18 2233 07/07/18 0609 07/07/18 1120  GLUCAP 107* 135* 209*    Physical Exam  HEENT-  Normocephalic, no lesions, without obvious abnormality.  Normal external eye and conjunctiva.   Cardiovascular- S1-S2 audible, pulses palpable throughout   Lungs-no rhonchi or wheezing noted, no excessive working breathing.  Saturations within normal limits Abdomen- All 4 quadrants palpated and nontender Musculoskeletal-right arm in sling, status post right humeral fracture.  Patient able to wiggle fingers equally with strength 5/5 in BUE. Cap refill < 3 secs Skin-warm and dry, no hyperpigmentation, vitiligo, or suspicious lesions  Neuro:  Mental Status: Alert,  oriented, thought content appropriate.  Speech fluent without evidence of aphasia.  Able to follow 3 step commands without difficulty. Cranial Nerves: II:  Visual fields grossly normal,  III,IV, VI: ptosis not present, extra-ocular motions intact bilaterally pupils equal, round, reactive to light and accommodation V,VII: smile symmetric, facial light touch sensation normal bilaterally VIII: hearing intact to voice IX,X: uvula rises symmetrically XI: bilateral shoulder shrug XII: midline tongue extension Motor: Right : Upper extremity   5/5    Left:     Upper extremity   5/5  Lower extremity   5/5     Lower extremity   5/5 Tone and bulk:normal tone throughout; no atrophy noted Sensory:sensation intact throughout, bilaterally Deep Tendon Reflexes: 2+ and symmetric throughout Plantars: Right: downgoing   Left: downgoing Cerebellar: normal finger-to-nose, with left arm, unable to perform in right arm due to sling Gait: Not tested  IV Fluid Intake:   . sodium chloride      MEDICATIONS  . aspirin  300 mg Rectal Daily   Or  . aspirin  325 mg Oral Daily  . atorvastatin  40 mg Oral q1800  . enoxaparin (LOVENOX) injection  40 mg Subcutaneous Q24H  . escitalopram  20 mg Oral Daily  . ferrous sulfate  325 mg Oral Daily  . folic acid  2 mg Oral Daily  . insulin aspart  0-15 Units Subcutaneous TID WC  . insulin glargine  60 Units Subcutaneous QHS  . latanoprost  1 drop Both Eyes Daily  . levothyroxine  125 mcg Oral QAC breakfast  . loratadine  10 mg Oral Daily  . magnesium gluconate  500 mg Oral Daily  . multivitamin with minerals  1 tablet Oral Daily  . vitamin B-12  100 mcg Oral Daily  . Vitamin D (Ergocalciferol)  50,000 Units Oral Q Mon   PRN:  acetaminophen **OR** acetaminophen (TYLENOL) oral liquid 160 mg/5 mL **OR** acetaminophen, albuterol, aspirin-acetaminophen-caffeine, ibuprofen, LORazepam, metaxalone, morphine injection, ondansetron, senna-docusate  Diet:   Diet Order             Diet Carb Modified Fluid consistency: Thin; Room service appropriate? Yes  Diet effective now               CLINICALLY SIGNIFICANT STUDIES Basic Metabolic Panel:  Recent Labs  Lab 07/06/18 1144 07/07/18 0514  NA 138 139  K 5.1 4.6  CL 107 110  CO2 20* 20*  GLUCOSE 219* 132*  BUN 31* 27*  CREATININE 1.31* 1.20*  CALCIUM 9.6 9.0   Liver Function Tests:  Recent  Labs  Lab 07/07/18 0514  AST 30  ALT 47*  ALKPHOS 60  BILITOT 0.7  PROT 6.0*  ALBUMIN 3.3*   CBC:  Recent Labs  Lab 07/06/18 1144  WBC 5.9  HGB 10.3*  HCT 35.0*  MCV 91.9  PLT 215   Coagulation: No results for input(s): LABPROT, INR in the last 168 hours. Cardiac Enzymes:  Recent Labs  Lab 07/06/18 1144  TROPONINI <0.03   Urinalysis:  Recent Labs  Lab 07/06/18 1432  COLORURINE YELLOW  LABSPEC 1.015  PHURINE 5.0  GLUCOSEU >=500*  HGBUR NEGATIVE  BILIRUBINUR NEGATIVE  KETONESUR NEGATIVE  PROTEINUR 30*  NITRITE NEGATIVE  LEUKOCYTESUR NEGATIVE   Lipid Panel    Component Value Date/Time   CHOL 221 (H) 07/07/2018 0514   TRIG 266 (H) 07/07/2018 0514   HDL 42 07/07/2018 0514   CHOLHDL 5.3 07/07/2018 0514   VLDL 53 (H) 07/07/2018 0514   LDLCALC 126 (H) 07/07/2018 0514   HgbA1C  Lab Results  Component Value Date   HGBA1C 7.9 (H) 07/06/2018    Urine Drug Screen:  No results found for: LABOPIA, COCAINSCRNUR, LABBENZ, AMPHETMU, THCU, LABBARB  Alcohol Level: No results for input(s): ETH in the last 168 hours.  Dg Chest 2 View  07/06/2018 IMPRESSION:  Mild cardiomegaly without evidence of acute cardiopulmonary disease.   Dg Shoulder Right 07/06/2018 IMPRESSION:  1. Oblique fracture of the midshaft right humerus with moderate displacement and angulation.   Ct Head Wo Contrast 07/06/2018 IMPRESSION:  No acute intracranial abnormalities.   MrI  Brain Wo Contrast/MRA head  07/06/2018 IMPRESSION: 1. No acute intracranial abnormality. Normal appearance of the brain for age.  2.  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 Otherwise normal intracranial MRA.   Dg Humerus Right 07/06/2018 IMPRESSION:  Displaced spiral fracture of the humeral diaphysis.    EKG : NSR  Outstanding Stroke Work-up Studies:     Echocardiogram:                                                    PENDING B/L Carotid U/S:                                                     Final read PENDING  ASSESSMENT/PLAN Likely Dizziness vs. Orthostatic hypotension from cushing syndrome. Symptoms not consistent with BPPV or meniere's disease  pt description of dizziness is vague including "self spinning" "feels in a boat".   Most time dizziness occurs only when getting up or in sitting position  Orthostatic vital no orthostatic hypotension today but also no symptoms (dizziness) on sitting up or standing up today.  CT of the brain: No acute intracranial abnormalities.   MRI of the brain  No acute intracranial abnormalities.  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 Continue on discharge.  Atorvastatin 40mg  daily started in hospital  Continue Rehab with PT consult, OT consult, Speech consult  Therapy recommendations:  SNF  Disposition:  pending  Hypertension - ? 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   Close follow up   Right humeral shaft fracture spiral oblique  with angulation    pt s/p traumatic fall due to her dizzyness.  Further discussions of ORIF which patient is agreeable to to be had after full stroke work-up completed  If patient medically optimized, she can proceed with surgery earliest Wednesday  Managed by ortho   Hyperlipidemia  Home meds:  None  LDL126 goal < 70  Add lipitor 40 mg daily  Continue statin at discharge  Diabetes Mellitus  HGA1C: 7.9  DM not in control  SSI  On Lanuts  CBG monitoring  Close PCP follow up   Other Stroke Risk Factors  Advanced age  Morbid Obesity  Migraine  Other Active Problems  Rheumatoid arthritis on methotrexate   Hypothyroidism  Anxiety  AKI  Hospital day # 1  SIGNED [facility] Neuro-hospitalist Team [phone] 07/07/2018, 3:49 PM   07/07/2018 ATTENDING ASSESSMENT   ATTENDING NOTE: I reviewed above note and agree with the assessment and plan. I have made any additions or clarifications directly to the above note. Pt was seen and examined.   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    Patient stated that she was doing well until Saturday and noontime she felt dizzy on walking, she then sat down but still feel dizzy which describe as self spinning and feeling in the boat.  She then lying down which she felt better but still did not resolve.  On Sunday she decided not to go to church but watching church activities in computer.  While she was sitting in front of computer, she felt very hot whole body, she stood up to get water and turn on the [facility], as soon as she slid up she fell and hitting her right arm to the washer and hit her head on the ground.  She was then calling her sister in broken sentences, EMS was called by sister at the time.  Patient stated that seems after that her dizziness was  resolved.  She denied any ear ringing or hearing loss.  She had a similar feeling in April 2018 when she was diagnosed with pseudo-Cushing's syndrome in the [facility].  She had a follow-up with [facility] and locally for adrenal insufficiency and adrenal adenoma which she was told to have been growing slowly.  On my examination, patient has no focal neurologic deficit.  Her right arm was wrapped with dressing and sling, and plan for surgery on Wednesday.  No nystagmus, no ataxia.  Orthostatic vitals showed no orthostatic hypotension but also patient has no any dizziness during the orthostatic vital checking.  Stroke work-up so far negative except right ICA siphon moderate stenosis, which likely not related to her symptoms.  A1c 7.9 LDL 126.  Patient symptoms not consistent with peripheral or central vertigo, unlikely BPPV or Mnire's disease.  Stroke work-up showed no stroke.  Her symptoms concerning for orthostatic hypotension in the setting of adrenal insufficiency, adrenal adenoma and a pseudo-Cushing's syndrome.  Recommend to continue  aspirin and Lipitor for stroke prevention given risk factors of hypertension, hyperlipidemia and diabetes, OSA and obesity.  Also recommend close follow-up with endocrinologist and her PCP for BP checking and rule out orthostatic hypotension.  Was educated on hydration and BP check at home.  Neurology will sign off. Please call with questions.  No neurology follow-up needed at this time. Thanks for the consult.   Rosalin Hawking, MD PhD Stroke Neurology 07/07/2018 7:36 PM  I spent  35 minutes in total face-to-face time with the patient, more than 50% of which was spent in counseling and coordination of care, reviewing test results, images and medication, and discussing the diagnosis of orthostatic hypotension, dizziness, diabetes, treatment plan and potential prognosis. This patient's care requiresreview of multiple databases, neurological assessment, discussion with family, other  specialists and medical decision making of high complexity.          To contact Stroke Continuity provider, please refer to [email or web address] After hours, contact General Neurology

## 2018-07-07 NOTE — Progress Notes (Signed)
PROGRESS NOTE    Jocelyn Kaufman  MGQ:[id number] DOB: Apr 05, 1956 DOA: 07/06/2018 PCP: Deland Pretty, MD    Brief Narrative: 62 year old with past medical history relevant for type 2 diabetes on insulin, anxiety/depression, hypothyroidism, possible adrenal insufficiency, headache disorder, hypertension who came in with vertigo and fall concerning for TIA complicated by humeral fracture on right arm.   Assessment & Plan:   Principal Problem:   Fall at home, initial encounter Active Problems:   Diabetes mellitus ([place])   Essential hypertension   Displaced spiral fracture of shaft of humerus, right arm, initial encounter for closed fracture   TIA (transient ischemic attack)   #) Possible TIA: Patient describes some expressive aphasia as well as vertigo.  This is resolved on presentation.  She did not have any chest pain or any palpitations when she had her vertigo. 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 -Orthopedic surgery consult, appreciate recommendations -Tentatively plan for OR on 07/09/2018 if medically cleared -Pain control -OT consult - Stress dose steroids per below  #) Hypertension: -Hold amlodipine 5 mg daily  -hold HCTZ 12.5 mg daily -Hold irbesartan 300 mg daily -Hold hydralazine 25 mg twice daily  #) Type 2 diabetes on insulin:  - continue glargine 60 units nightly -Sliding scale insulin, AC at bedtime -Hold home dulaglutide 1.5 mg weekly - Hold metformin 1500 mg daily  #) Hypothyroidism: - Continue levothyroxine 125 mcg daily  #)  Pain/psych: -Continue escitalopram 20 mg daily - Continue PRN metallic sound - Continue PRN lorazepam  #) Possible adrenal insufficiency: This is been noted in her chart before.  It is unclear where this diagnosis comes from as she is not on chronic steroids.  Will consider giving 1 day of stress dose steroids if patient is going to the operating room.  Fluids: Tolerating p.o. Electrodes: Monitor and supplement Nutrition: Carb restricted diet  Prophylaxis: Enoxaparin  Disposition: Pending TIA work-up and surgery  Full code  Consultants:   Neurology  Orthopedic surgery, [facility]  Procedures:   Echo 07/07/2018: Pending  Vascular ultrasound carotid 07/07/2018: Pending  Antimicrobials:   None   Subjective: Patient has many questions about her current diagnoses.  She apparently is quite worried about the report of a possible art right ICA stenosis noted on MRI.  She otherwise reports that her symptoms have completely resolved.  She will reports pain in her right arm.  She denies any nausea, vomiting, diarrhea, chest pain, cough, congestion, rhinorrhea.  Objective: Vitals:   07/06/18 2200 07/06/18 2341 07/07/18 0020 07/07/18 0444  BP: 136/70 140/68 135/78   Pulse: 71 72 71   Resp: 15 18 16    Temp: 98.7 F (37.1 C) 99.6 F (37.6 C) 98.6 F (37 C) 99.2 F (37.3 C)  TempSrc: Oral Oral Oral Oral  SpO2: 97% 100% 96%   Weight:      Height:       No intake or output data in the 24 hours ending 07/07/18 0851 Filed Weights   07/06/18 1427 07/06/18 2011  Weight: 104.4 kg 107.2 kg    Examination:  General  exam: Appears calm and comfortable  Respiratory system: Clear to auscultation. Respiratory effort normal. Cardiovascular system: Regular rate and rhythm, no murmurs Gastrointestinal system: Abdomen is nondistended, soft and nontender. No organomegaly or masses felt. Normal bowel sounds heard. Central nervous system: Alert and oriented. No focal  neurological deficits.  Right upper extremity limited by splint Extremities: Intact distal pulses in right upper extremity trace lower extremity edema Skin: No rashes over visible skin Psychiatry: Judgement and insight appear normal. Mood & affect appropriate.     Data Reviewed: I have personally reviewed following labs and imaging studies  CBC: Recent Labs  Lab 07/06/18 1144  WBC 5.9  HGB 10.3*  HCT 35.0*  MCV 91.9  PLT 709   Basic Metabolic Panel: Recent Labs  Lab 07/06/18 1144 07/07/18 0514  NA 138 139  K 5.1 4.6  CL 107 110  CO2 20* 20*  GLUCOSE 219* 132*  BUN 31* 27*  CREATININE 1.31* 1.20*  CALCIUM 9.6 9.0   GFR: Estimated Creatinine Clearance: 58.1 mL/min (A) (by C-G formula based on SCr of 1.2 mg/dL (H)). Liver Function Tests: Recent Labs  Lab 07/07/18 0514  AST 30  ALT 47*  ALKPHOS 60  BILITOT 0.7  PROT 6.0*  ALBUMIN 3.3*   No results for input(s): LIPASE, AMYLASE in the last 168 hours. No results for input(s): AMMONIA in the last 168 hours. Coagulation Profile: No results for input(s): INR, PROTIME in the last 168 hours. Cardiac Enzymes: Recent Labs  Lab 07/06/18 1144  TROPONINI <0.03   BNP (last 3 results) No results for input(s): PROBNP in the last 8760 hours. HbA1C: Recent Labs    07/06/18 1957  HGBA1C 7.9*   CBG: Recent Labs  Lab 07/06/18 2233 07/07/18 0609  GLUCAP 107* 135*   Lipid Profile: Recent Labs    07/07/18 0514  CHOL 221*  HDL 42  LDLCALC 126*  TRIG 266*  CHOLHDL 5.3   Thyroid Function Tests: No results for input(s): TSH, T4TOTAL, FREET4, T3FREE, THYROIDAB in the last 72 hours. Anemia Panel: No results for input(s): VITAMINB12, FOLATE, FERRITIN, TIBC, IRON, RETICCTPCT in the last 72 hours. Sepsis Labs: No results for input(s): PROCALCITON, LATICACIDVEN in the last 168 hours.  No results found for this or any previous visit (from the past 240 hour(s)).       Radiology Studies: Dg Chest 2  View  Result Date: 07/06/2018 CLINICAL DATA:  Acute RIGHT chest pain following fall. Initial encounter. EXAM: CHEST - 2 VIEW COMPARISON:  04/05/2017 and prior radiographs FINDINGS: Mild cardiomegaly again noted. There is no evidence of focal airspace disease, pulmonary edema, suspicious pulmonary nodule/mass, pleural effusion, or pneumothorax. No acute bony abnormalities are identified. IMPRESSION: Mild cardiomegaly without evidence of acute cardiopulmonary disease. Electronically Signed   By: Margarette Canada M.D.   On: 07/06/2018 21:34   Dg Shoulder Right  Result Date: 07/06/2018 CLINICAL DATA:  Dizzy.  Fall.  Right arm and shoulder pain. EXAM: RIGHT SHOULDER - 2+ VIEW COMPARISON:  None. FINDINGS: An oblique midshaft right humerus fracture demonstrates at least 15 degrees angulation. Fracture demonstrates displacement of 1/2 the shaft width. Degenerative changes present at the shoulder without additional fractures. IMPRESSION: 1. Oblique fracture of the midshaft right humerus with moderate displacement and angulation. Electronically Signed   By: San Morelle M.D.   On: 07/06/2018 13:49   Ct Head Wo Contrast  Result Date: 07/06/2018 CLINICAL DATA:  Fall.  Dizziness. EXAM: CT HEAD WITHOUT CONTRAST TECHNIQUE: Contiguous axial images were obtained from the  base of the skull through the vertex without intravenous contrast. COMPARISON:  None. FINDINGS: Brain: No evidence of acute infarction, hemorrhage, hydrocephalus, extra-axial collection or mass lesion/mass effect. Vascular: Calcified atherosclerosis in the intracranial carotids. Skull: Normal. Negative for fracture or focal lesion. Sinuses/Orbits: No acute finding. Other: None. IMPRESSION: No acute intracranial abnormalities. Electronically Signed   By: Dorise Bullion III M.D   On: 07/06/2018 13:28   Mr Brain Wo Contrast  Result Date: 07/06/2018 CLINICAL DATA:  Dizziness EXAM: MRI HEAD WITHOUT CONTRAST MRA HEAD WITHOUT CONTRAST TECHNIQUE: Multiplanar,  multiecho pulse sequences of the brain and surrounding structures were obtained without intravenous contrast. Angiographic images of the head were obtained using MRA technique without contrast. COMPARISON:  Head CT 07/06/2018 FINDINGS: MRI HEAD FINDINGS BRAIN: There is no acute infarct, acute hemorrhage or mass effect. Partially empty sella. There are no old infarcts. The white matter signal is normal for the patient's age. The CSF spaces are normal for age, with no hydrocephalus. Susceptibility-sensitive sequences show no chronic microhemorrhage or superficial siderosis. SKULL AND UPPER CERVICAL SPINE: The visualized skull base, calvarium, upper cervical spine and extracranial soft tissues are normal. SINUSES/ORBITS: No fluid levels or advanced mucosal thickening. No mastoid or middle ear effusion. The orbits are normal. MRA HEAD FINDINGS Intracranial internal carotid arteries: Moderate-to-severe stenosis of the cavernous segment of the right ICA. Anterior cerebral arteries: Normal. Middle cerebral arteries: Normal. Posterior communicating arteries: Present bilaterally. Posterior cerebral arteries: Normal. Basilar artery: Normal. Vertebral arteries: Left dominant. Normal. Superior cerebellar arteries: Normal. Inferior cerebellar arteries: Normal. IMPRESSION: 1. No acute intracranial abnormality. Normal appearance of the brain for age. 2. 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 Otherwise normal intracranial MRA. Electronically Signed   By: Ulyses Jarred M.D.   On: 07/06/2018 22:26   Dg Humerus Right  Result Date: 07/06/2018 CLINICAL DATA:  Fall.  Right upper arm pain.  Initial encounter. EXAM: RIGHT HUMERUS - 2+ VIEW COMPARISON:  None. FINDINGS: A spiral fracture of the humeral diaphysis is seen with medial displacement and angulation of the distal fracture fragment. Generalized osteopenia noted. IMPRESSION: Displaced spiral fracture of the humeral diaphysis.  Electronically Signed   By: Earle Gell M.D.   On: 07/06/2018 13:49   Mr Jodene Nam Head Wo Contrast  Result Date: 07/06/2018 CLINICAL DATA:  Dizziness EXAM: MRI HEAD WITHOUT CONTRAST MRA HEAD WITHOUT CONTRAST TECHNIQUE: Multiplanar, multiecho pulse sequences of the brain and surrounding structures were obtained without intravenous contrast. Angiographic images of the head were obtained using MRA technique without contrast. COMPARISON:  Head CT 07/06/2018 FINDINGS: MRI HEAD FINDINGS BRAIN: There is no acute infarct, acute hemorrhage or mass effect. Partially empty sella. There are no old infarcts. The white matter signal is normal for the patient's age. The CSF spaces are normal for age, with no hydrocephalus. Susceptibility-sensitive sequences show no chronic microhemorrhage or superficial siderosis. SKULL AND UPPER CERVICAL SPINE: The visualized skull base, calvarium, upper cervical spine and extracranial soft tissues are normal. SINUSES/ORBITS: No fluid levels or advanced mucosal thickening. No mastoid or middle ear effusion. The orbits are normal. MRA HEAD FINDINGS Intracranial internal carotid arteries: Moderate-to-severe stenosis of the cavernous segment of the right ICA. Anterior cerebral arteries: Normal. Middle cerebral arteries: Normal. Posterior communicating arteries: Present bilaterally. Posterior cerebral arteries: Normal. Basilar artery: Normal. Vertebral arteries: Left dominant. Normal. Superior cerebellar arteries: Normal. Inferior cerebellar arteries: Normal. IMPRESSION: 1. No acute intracranial abnormality. Normal appearance of the brain for age. 2. Moderate-to-severe stenosis of the right internal carotid  artery cavernous segment. Otherwise normal intracranial MRA. Electronically Signed   By: Ulyses Jarred M.D.   On: 07/06/2018 22:26        Scheduled Meds: . amLODipine  5 mg Oral Daily  . aspirin  300 mg Rectal Daily   Or  . aspirin  325 mg Oral Daily  . atorvastatin  20 mg Oral q1800  .  cholecalciferol  50,000 Units Oral Q Mon  . Dulaglutide  1.5 mg Subcutaneous Weekly  . enoxaparin (LOVENOX) injection  40 mg Subcutaneous Q24H  . escitalopram  20 mg Oral Daily  . ferrous sulfate  325 mg Oral Daily  . folic acid  2 mg Oral Daily  . hydrALAZINE  25 mg Oral BID  . irbesartan  300 mg Oral Daily   And  . hydrochlorothiazide  12.5 mg Oral Daily  . insulin aspart  0-15 Units Subcutaneous TID WC  . insulin glargine  60 Units Subcutaneous QHS  . latanoprost  1 drop Both Eyes Daily  . levothyroxine  125 mcg Oral QAC breakfast  . loratadine  10 mg Oral Daily  . magnesium gluconate  500 mg Oral Daily  . metFORMIN  1,500 mg Oral Q breakfast  . multivitamin with minerals  1 tablet Oral Daily  . vitamin B-12  100 mcg Oral Daily   Continuous Infusions: . sodium chloride       LOS: 1 day    Time spent: [place], MD [facility]  If 7PM-7AM, please contact night-coverage [email or web address] Password [email or web address] 07/07/2018, 8:51 AM

## 2018-07-07 NOTE — Progress Notes (Signed)
Echocardiogram 2D Echocardiogram has been performed.  Jocelyn Kaufman 07/07/2018, 3:15 PM

## 2018-07-07 NOTE — Progress Notes (Signed)
*  Preliminary Results* Carotid artery duplex has been completed. Bilateral internal carotid arteries are 1-39%. Vertebral arteries are patent with antegrade flow.  07/07/2018 3:10 PM  Keedan Sample Dawna Part

## 2018-07-07 NOTE — Evaluation (Signed)
Occupational Therapy Evaluation Patient Details Name: Jocelyn Kaufman MRN: [id number] DOB: [birth year] Today's Date: 07/07/2018    History of Present Illness Patient is a 62 y/o female who presents s/p fall from home. Also with dizziness and speech difficulties. XRAY of humerus-Displaced spiral fracture of the humeral diaphysis. Head CT, MRI-unremarkable. PMH: DM, HTN, HLD, Depression, Cushings disease.    Clinical Impression   PT admitted with R humerus displaced spiral fx with pending decisions for possible ORIF . Pt currently with functional limitiations due to the deficits listed below (see OT problem list). Pt currently requires (A) for all Adls due to R UE fx and balance deficits. Pt with dizziness s/p bathroom transfer.  Pt will benefit from skilled OT to increase their independence and safety with adls and balance to allow discharge SNF ( pending progress).     Follow Up Recommendations  SNF    Equipment Recommendations  None recommended by OT    Recommendations for Other Services       Precautions / Restrictions Precautions Precautions: Fall Precaution Comments: dizziness Required Braces or Orthoses: Sling Restrictions Weight Bearing Restrictions: Yes RUE Weight Bearing: Non weight bearing Other Position/Activity Restrictions: no WB status in chart but documented fx so kept NWB in sling      Mobility Bed Mobility Overal bed mobility: Needs Assistance Bed Mobility: Supine to Sit     Supine to sit: Mod assist     General bed mobility comments: requires (A) to elevate trunk from bed surface but able to progress bil LE to EOB   Transfers Overall transfer level: Needs assistance Equipment used: 1 person hand held assist Transfers: Sit to/from Stand Sit to Stand: Min assist              Balance Overall balance assessment: Needs assistance Sitting-balance support: Single extremity supported;Feet supported Sitting balance-Leahy Scale: Good     Standing  balance support: Single extremity supported;During functional activity Standing balance-Leahy Scale: Poor                             ADL either performed or assessed with clinical judgement   ADL Overall ADL's : Needs assistance/impaired     Grooming: Wash/dry hands;Moderate assistance   Upper Body Bathing: Moderate assistance   Lower Body Bathing: Maximal assistance   Upper Body Dressing : Moderate assistance Upper Body Dressing Details (indicate cue type and reason): new gown don due to patient having R UE under gown and states "no one has offered to do other wise" Lower Body Dressing: Maximal assistance   Toilet Transfer: Minimal assistance   Toileting- Clothing Manipulation and Hygiene: Min guard       Functional mobility during ADLs: Minimal assistance General ADL Comments: pt does report some dizziness with bathroom transfer     Vision         Perception     Praxis      Pertinent Vitals/Pain Pain Assessment: Faces Faces Pain Scale: Hurts whole lot Pain Location: right arm Pain Descriptors / Indicators: Grimacing;Guarding;Aching;Discomfort Pain Intervention(s): Monitored during session;Premedicated before session;Repositioned     Hand Dominance Right(but uses left hand as well for certain things)   Extremity/Trunk Assessment Upper Extremity Assessment Upper Extremity Assessment: RUE deficits/detail RUE Deficits / Details: educated on edema management positioning and importance of elevation. pt very guarded R UE RUE: Unable to fully assess due to pain   Lower Extremity Assessment Lower Extremity Assessment: Defer to PT evaluation  Cervical / Trunk Assessment Cervical / Trunk Assessment: Normal   Communication Communication Communication: No difficulties   Cognition Arousal/Alertness: Awake/alert Behavior During Therapy: WFL for tasks assessed/performed Overall Cognitive Status: No family/caregiver present to determine baseline  cognitive functioning                                 General Comments: pt with a flat affect but did smile x2 during session. pt able to correctly recall providers by name and POC currently   General Comments  sling adjusted to proper fit    Exercises     Shoulder Instructions      Home Living Family/patient expects to be discharged to:: Private residence Living Arrangements: Alone Available Help at Discharge: Family Type of Home: House Home Access: Stairs to enter Technical brewer of Steps: 2 Entrance Stairs-Rails: Right Home Layout: One level               Home Equipment: [place] - single point          Prior Functioning/Environment Level of Independence: Independent with assistive device(s)        Comments: Uses SPC PRN for ambulation.         OT Problem List: Decreased strength;Decreased range of motion;Decreased activity tolerance;Impaired balance (sitting and/or standing);Decreased safety awareness;Decreased knowledge of use of DME or AE;Decreased knowledge of precautions;Obesity;Impaired UE functional use;Pain;Increased edema      OT Treatment/Interventions: Self-care/ADL training;Therapeutic exercise;Energy conservation;DME and/or AE instruction;Manual therapy;Modalities;Splinting;Therapeutic activities;Patient/family education;Balance training    OT Goals(Current goals can be found in the care plan section) Acute Rehab OT Goals Patient Stated Goal: to be able to go to and from the bathroom OT Goal Formulation: With patient Time For Goal Achievement: 07/21/18 Potential to Achieve Goals: Good  OT Frequency: Min 2X/week   Barriers to D/C: Decreased caregiver support          Co-evaluation              AM-PAC PT "6 Clicks" Daily Activity     Outcome Measure Help from another person eating meals?: A Little Help from another person taking care of personal grooming?: A Lot Help from another person toileting, which includes  using toliet, bedpan, or urinal?: A Lot Help from another person bathing (including washing, rinsing, drying)?: A Lot Help from another person to put on and taking off regular upper body clothing?: A Lot Help from another person to put on and taking off regular lower body clothing?: A Lot 6 Click Score: 13   End of Session Nurse Communication: Mobility status;Precautions;Weight bearing status  Activity Tolerance: Patient tolerated treatment well Patient left: in bed;with call bell/phone within reach;Other (comment)(transport arriving )  OT Visit Diagnosis: Unsteadiness on feet (R26.81);Repeated falls (R29.6);Muscle weakness (generalized) (M62.81);Pain Pain - Right/Left: Right Pain - part of body: Arm                Time: 2800-3491 OT Time Calculation (min): 25 min Charges:  OT General Charges $OT Visit: 1 Visit OT Evaluation $OT Eval Moderate Complexity: 1 Mod OT Treatments $Self Care/Home Management : 8-22 mins   Jeri Modena, OTR/L  Acute Rehabilitation Services Pager: [phone] Office: [phone] .   Parke Poisson B 07/07/2018, 4:05 PM

## 2018-07-07 NOTE — H&P (View-Only) (Signed)
ORTHOPAEDIC CONSULTATION  REQUESTING PHYSICIAN: Purohit, Konrad Dolores, MD  Chief Complaint: Right humerus fracture  HPI: Jocelyn Kaufman is a 62 y.o. female with syncopal episode and work-up for stroke who had a fall resulting in a right humerus fracture.  Patient states that she was somewhat dizzy prior and fell in the arm.  She had no pain in the arm.  She is being admitted for a work-up for stroke and has multiple other medical comorbidities including diabetes rheumatoid arthritis on methotrexate and adrenal insufficiency.  Patient is right-hand dominant.  Past Medical History:  Diagnosis Date  . Adrenal adenoma   . 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 Depression   . Diabetes mellitus without complication ([place])   . Hyperlipidemia   . Hypertension   . Hypothyroidism   . Meralgia paresthetica of right side 01/10/2017  . Migraine   . Pericardial effusion   . 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 APPENDECTOMY    . CYST REMOVAL LEG Right    Removed from right thigh joint  . TONSILLECTOMY    . TOTAL VAGINAL HYSTERECTOMY    . UMBILICAL HERNIA REPAIR     Social History   Socioeconomic History  . Marital status: Single    Spouse name: Not on file  . Number of children: 0  . Years of education: Masters  . Highest education level: Not on file  Occupational History  . Occupation: [employer]  Social Needs  . Financial resource strain: Not on file  . Food insecurity:    Worry: Not on file    Inability: Not on file  . Transportation needs:    Medical: Not on file    Non-medical: Not on file  Tobacco Use  . Smoking status: Never Smoker  . Smokeless tobacco: Never Used  Substance and Sexual Activity  . Alcohol use: Yes    Comment: occ  . Drug use: No  . Sexual activity: Never  Lifestyle  . Physical activity:    Days per week: Not on file    Minutes per session: Not on file  . Stress: Not on file  Relationships   . Social connections:    Talks on phone: Not on file    Gets together: Not on file    Attends religious service: Not on file    Active member of club or organization: Not on file    Attends meetings of clubs or organizations: Not on file    Relationship status: Not on file  Other Topics Concern  . Not on file  Social History Narrative   Lives alone   Caffeine use: 2 cups coffee per day   Tea sometimes   Soda- 3 x/week   Right-handed   Family History  Problem Relation Age of Onset  . Hypertension Mother   . Stroke Mother   . Kidney failure Mother   . Heart disease Mother        heart murmur  . Glaucoma Mother   . Stroke Father   . COPD Father   . Cancer - Prostate Father   . Colon cancer Father    Allergies  Allergen Reactions  . Invokana [Canagliflozin] Other (See Comments)    Urinary incontinence  . Percocet [Oxycodone-Acetaminophen] Nausea And Vomiting  . Prilosec [Omeprazole] Rash  . Rogaine [Minoxidil] Rash  . Victoza [Liraglutide] Itching   Prior to Admission medications   Medication Sig Start  Date End Date Taking? Authorizing Provider  acetaminophen (TYLENOL) 500 MG tablet Take 1,000 mg by mouth every 6 (six) hours as needed for mild pain.   Yes [provider]  albuterol (PROVENTIL HFA;VENTOLIN HFA) 108 (90 Base) MCG/ACT inhaler Inhale two puffs every four to six hours as needed for cough or wheeze. Patient taking differently: Inhale 2 puffs into the lungs every 4 (four) hours as needed for wheezing (or cough).  07/04/18  Yes Ambs, Kathrine Cords, FNP  amLODipine (NORVASC) 5 MG tablet Take 5 mg by mouth daily. 06/04/16  Yes [provider]  Aspirin-Acetaminophen-Caffeine (EXCEDRIN MIGRAINE PO) Take 2 tablets by mouth as needed (migraine).    Yes [provider]  Biotin 1 MG CAPS Take 1 capsule by mouth daily.    Yes [provider]  cetirizine (ZYRTEC) 10 MG tablet Take 10 mg by mouth daily.   Yes [provider]    Cholecalciferol (VITAMIN D-3 PO) Take 50,000 Units by mouth every Monday.    Yes [provider]  Dulaglutide (TRULICITY) 1.5 DE/0.8XK SOPN Inject 1.5 mg into the skin once a week.    Yes [provider]  empagliflozin (JARDIANCE) 25 MG TABS tablet Take 25 mg by mouth daily.  05/22/18  Yes [provider]  EPINEPHrine (EPIPEN 2-PAK) 0.3 mg/0.3 mL IJ SOAJ injection Use as directed for life-threatening allergic reaction. Patient taking differently: Inject 0.3 mg into the muscle once. for life-threatening allergic reaction. 08/21/16  Yes Kozlow, Donnamarie Poag, MD  escitalopram (LEXAPRO) 20 MG tablet Take 20 mg by mouth daily.   Yes [provider]  ferrous sulfate 325 (65 FE) MG tablet Take 365 mg by mouth daily.    Yes [provider]  folic acid (FOLVITE) 1 MG tablet Take 2 mg by mouth daily.   Yes [provider]  Golimumab (Exline [place]) Inject into the skin.   Yes [provider]  hydrALAZINE (APRESOLINE) 25 MG tablet Take 25 mg by mouth 2 (two) times daily.   Yes [provider]  ibuprofen (ADVIL,MOTRIN) 200 MG tablet Take 200 mg by mouth every 6 (six) hours as needed for moderate pain.    Yes [provider]  Insulin Glargine (BASAGLAR KWIKPEN) 100 UNIT/ML SOPN Inject 60-66 Units into the skin daily.  08/01/16  Yes [provider]  irbesartan-hydrochlorothiazide (AVALIDE) 300-12.5 MG tablet Take 1 tablet by mouth daily.   Yes [provider]  levothyroxine (SYNTHROID) 125 MCG tablet Take 125 mcg by mouth daily before breakfast. BRAND ONLY   Yes [provider]  LORazepam (ATIVAN) 0.5 MG tablet Take 0.5 mg by mouth as needed for anxiety.   Yes [provider]  Magnesium 500 MG TABS Take 500 mg by mouth daily.    Yes [provider]  metaxalone (SKELAXIN) 800 MG tablet Take 400 mg by mouth daily as needed for muscle spasms.    Yes [provider]  metFORMIN (GLUCOPHAGE-XR)  500 MG 24 hr tablet Take 1,500 mg by mouth daily.    Yes [provider]  Methotrexate, Anti-Rheumatic, (METHOTREXATE, PF, [place]) Inject 0.8 Units into the skin once a week.   Yes [provider]  Multiple Vitamins-Minerals (CENTRUM SILVER PO) Take 1 tablet by mouth daily.   Yes [provider]  TRAVATAN Z 0.004 % SOLN ophthalmic solution Place 0.004 drops into both eyes daily. 07/21/16  Yes [provider]  vitamin B-12 (CYANOCOBALAMIN) 100 MCG tablet Take 100 mcg by mouth daily.  Yes [provider]   Dg Chest 2 View  Result Date: 07/06/2018 CLINICAL DATA:  Acute RIGHT chest pain following fall. Initial encounter. EXAM: CHEST - 2 VIEW COMPARISON:  04/05/2017 and prior radiographs FINDINGS: Mild cardiomegaly again noted. There is no evidence of focal airspace disease, pulmonary edema, suspicious pulmonary nodule/mass, pleural effusion, or pneumothorax. No acute bony abnormalities are identified. IMPRESSION: Mild cardiomegaly without evidence of acute cardiopulmonary disease. Electronically Signed   By: Margarette Canada M.D.   On: 07/06/2018 21:34   Dg Shoulder Right  Result Date: 07/06/2018 CLINICAL DATA:  Dizzy.  Fall.  Right arm and shoulder pain. EXAM: RIGHT SHOULDER - 2+ VIEW COMPARISON:  None. FINDINGS: An oblique midshaft right humerus fracture demonstrates at least 15 degrees angulation. Fracture demonstrates displacement of 1/2 the shaft width. Degenerative changes present at the shoulder without additional fractures. IMPRESSION: 1. Oblique fracture of the midshaft right humerus with moderate displacement and angulation. Electronically Signed   By: San Morelle M.D.   On: 07/06/2018 13:49   Ct Head Wo Contrast  Result Date: 07/06/2018 CLINICAL DATA:  Fall.  Dizziness. EXAM: CT HEAD WITHOUT CONTRAST TECHNIQUE: Contiguous axial images were obtained from the base of the skull through the vertex without intravenous contrast. COMPARISON:  None. FINDINGS:  Brain: No evidence of acute infarction, hemorrhage, hydrocephalus, extra-axial collection or mass lesion/mass effect. Vascular: Calcified atherosclerosis in the intracranial carotids. Skull: Normal. Negative for fracture or focal lesion. Sinuses/Orbits: No acute finding. Other: None. IMPRESSION: No acute intracranial abnormalities. Electronically Signed   By: Dorise Bullion III M.D   On: 07/06/2018 13:28   Mr Brain Wo Contrast  Result Date: 07/06/2018 CLINICAL DATA:  Dizziness EXAM: MRI HEAD WITHOUT CONTRAST MRA HEAD WITHOUT CONTRAST TECHNIQUE: Multiplanar, multiecho pulse sequences of the brain and surrounding structures were obtained without intravenous contrast. Angiographic images of the head were obtained using MRA technique without contrast. COMPARISON:  Head CT 07/06/2018 FINDINGS: MRI HEAD FINDINGS BRAIN: There is no acute infarct, acute hemorrhage or mass effect. Partially empty sella. There are no old infarcts. The white matter signal is normal for the patient's age. The CSF spaces are normal for age, with no hydrocephalus. Susceptibility-sensitive sequences show no chronic microhemorrhage or superficial siderosis. SKULL AND UPPER CERVICAL SPINE: The visualized skull base, calvarium, upper cervical spine and extracranial soft tissues are normal. SINUSES/ORBITS: No fluid levels or advanced mucosal thickening. No mastoid or middle ear effusion. The orbits are normal. MRA HEAD FINDINGS Intracranial internal carotid arteries: Moderate-to-severe stenosis of the cavernous segment of the right ICA. Anterior cerebral arteries: Normal. Middle cerebral arteries: Normal. Posterior communicating arteries: Present bilaterally. Posterior cerebral arteries: Normal. Basilar artery: Normal. Vertebral arteries: Left dominant. Normal. Superior cerebellar arteries: Normal. Inferior cerebellar arteries: Normal. IMPRESSION: 1. No acute intracranial abnormality. Normal appearance of the brain for age. 2. 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 Otherwise normal intracranial MRA. Electronically Signed   By: Ulyses Jarred M.D.   On: 07/06/2018 22:26   Dg Humerus Right  Result Date: 07/06/2018 CLINICAL DATA:  Fall.  Right upper arm pain.  Initial encounter. EXAM: RIGHT HUMERUS - 2+ VIEW COMPARISON:  None. FINDINGS: A spiral fracture of the humeral diaphysis is seen with medial displacement and angulation of the distal fracture fragment. Generalized osteopenia noted. IMPRESSION: Displaced spiral fracture of the humeral diaphysis. Electronically Signed   By: Earle Gell M.D.   On: 07/06/2018 13:49   Mr Jodene Nam Head Wo Contrast  Result Date: 07/06/2018 CLINICAL DATA:  Dizziness EXAM: MRI HEAD WITHOUT CONTRAST MRA HEAD WITHOUT CONTRAST TECHNIQUE: Multiplanar, multiecho pulse sequences of the brain and surrounding structures were obtained without intravenous contrast. Angiographic images of the head were obtained using MRA technique without contrast. COMPARISON:  Head CT 07/06/2018 FINDINGS: MRI HEAD FINDINGS BRAIN: There is no acute infarct, acute hemorrhage or mass effect. Partially empty sella. There are no old infarcts. The white matter signal is normal for the patient's age. The CSF spaces are normal for age, with no hydrocephalus. Susceptibility-sensitive sequences show no chronic microhemorrhage or superficial siderosis. SKULL AND UPPER CERVICAL SPINE: The visualized skull base, calvarium, upper cervical spine and extracranial soft tissues are normal. SINUSES/ORBITS: No fluid levels or advanced mucosal thickening. No mastoid or middle ear effusion. The orbits are normal. MRA HEAD FINDINGS Intracranial internal carotid arteries: Moderate-to-severe stenosis of the cavernous segment of the right ICA. Anterior cerebral arteries: Normal. Middle cerebral arteries: Normal. Posterior communicating arteries: Present bilaterally. Posterior cerebral arteries: Normal. Basilar artery: Normal. Vertebral  arteries: Left dominant. Normal. Superior cerebellar arteries: Normal. Inferior cerebellar arteries: Normal. IMPRESSION: 1. No acute intracranial abnormality. Normal appearance of the brain for age. 2. 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 Otherwise normal intracranial MRA. Electronically Signed   By: Ulyses Jarred M.D.   On: 07/06/2018 22:26   Family History Reviewed and non-contributory, no pertinent history of problems with bleeding or anesthesia      Review of Systems 14 system ROS conducted and negative except for that noted in HPI   OBJECTIVE  Vitals: Patient Vitals for the past 8 hrs:  BP Temp Temp src Pulse Resp SpO2  07/07/18 0444 - 99.2 F (37.3 C) Oral - - -  07/07/18 0020 135/78 98.6 F (37 C) Oral 71 16 96 %  07/06/18 2341 140/68 99.6 F (37.6 C) Oral 72 18 100 %   General: Alert, no acute distress Cardiovascular: No pedal edema Respiratory: No cyanosis, no use of accessory musculature GI: No organomegaly, abdomen is soft and non-tender Skin: No lesions in the area of chief complaint other than those listed below in MSK exam.  Neurologic: Sensation intact distally save for the below mentioned MSK exam Psychiatric: Patient is competent for consent with normal mood and affect Lymphatic: No axillary or cervical lymphadenopathy Extremities  RUE: Coaptation splint in place, radial, median, ulnar motor function distally preserved the patient reports some numbness in her fingers diffusely.  Unable to check range of motion of the elbow or shoulder secondary to fracture.  Warm well perfused hand.  Patient's arm is relatively large due to body habitus.    Test Results Imaging X-rays of the humerus reviewed prior to splinting demonstrating almost 30 degrees of varus alignment with a spiral oblique midshaft humerus fracture.  Labs cbc Recent Labs    07/06/18 1144  WBC 5.9  HGB 10.3*  HCT 35.0*  PLT 215    Labs inflam No  results for input(s): CRP in the last 72 hours.  Invalid input(s): ESR  Labs coag No results for input(s): INR, PTT in the last 72 hours.  Invalid input(s): PT  Recent Labs    07/06/18 1144 07/07/18 0514  NA 138 139  K 5.1 4.6  CL 107 110  CO2 20* 20*  GLUCOSE 219* 132*  BUN 31* 27*  CREATININE 1.31* 1.20*  CALCIUM 9.6 9.0     ASSESSMENT AND PLAN: 62 y.o. female with the following: Right humeral shaft fracture spiral oblique with angulation  Long discussion with  the patient regarding her options.  Her body habitus makes splinting difficult but her morbidities make her wrist from surgery somewhat higher than others.  She initially needs a work-up for any acute stroke or other pathology prior to talking about surgical management but if she has no other medical concerns she would potentially be a candidate for surgery.  And we talked about the risk benefits and alternatives of ORIF of the humerus she elected to proceed as long as it is medically appropriate.  Depending on the medical team's work-up will make final decision.  If she is still an inpatient on Wednesday we can perform the surgery then if she is medically optimized otherwise she can be discharged and have outpatient surgery.  We will check in on her progress again before making decision.  The risks benefits and alternatives were discussed with the patient including but not limited to the risks of nonoperative treatment, versus surgical intervention including infection, bleeding, nerve injury, periprosthetic fracture, the need for revision surgery,blood clots, cardiopulmonary complications, morbidity, mortality, among others, and they were willing to proceed.    Specific risks of radial nerve injury and wound healing would be elevated in this patient as well as nonunion and she understands this.

## 2018-07-08 LAB — GLUCOSE, CAPILLARY
Glucose-Capillary: 126 mg/dL — ABNORMAL HIGH (ref 70–99)
Glucose-Capillary: 130 mg/dL — ABNORMAL HIGH (ref 70–99)
Glucose-Capillary: 174 mg/dL — ABNORMAL HIGH (ref 70–99)
Glucose-Capillary: 175 mg/dL — ABNORMAL HIGH (ref 70–99)
Glucose-Capillary: 235 mg/dL — ABNORMAL HIGH (ref 70–99)
Glucose-Capillary: 206 mg/dL — ABNORMAL HIGH (ref 70–99)

## 2018-07-08 LAB — CBC
HCT: 32 % — ABNORMAL LOW (ref 36.0–46.0)
Hemoglobin: 9.2 g/dL — ABNORMAL LOW (ref 12.0–15.0)
MCH: 26.8 pg (ref 26.0–34.0)
MCHC: 28.8 g/dL — ABNORMAL LOW (ref 30.0–36.0)
MCV: 93.3 fL (ref 78.0–100.0)
Platelets: 178 10*3/uL (ref 150–400)
RBC: 3.43 MIL/uL — ABNORMAL LOW (ref 3.87–5.11)
RDW: 14.1 % (ref 11.5–15.5)
WBC: 6.9 10*3/uL (ref 4.0–10.5)

## 2018-07-08 LAB — BASIC METABOLIC PANEL
Anion gap: 9 (ref 5–15)
BUN: 22 mg/dL (ref 8–23)
CO2: 22 mmol/L (ref 22–32)
Calcium: 8.9 mg/dL (ref 8.9–10.3)
Chloride: 108 mmol/L (ref 98–111)
Creatinine, Ser: 1.05 mg/dL — ABNORMAL HIGH (ref 0.44–1.00)
GFR calc Af Amer: >60 mL/min (ref >60)
GFR calc non Af Amer: 56 mL/min — ABNORMAL LOW (ref >60)
Glucose, Bld: 138 mg/dL — ABNORMAL HIGH (ref 70–99)
Potassium: 4.5 mmol/L (ref 3.5–5.1)
Sodium: 139 mmol/L (ref 135–145)

## 2018-07-08 LAB — MAGNESIUM: Magnesium: 1.9 mg/dL (ref 1.7–2.4)

## 2018-07-08 MED ORDER — HYDRALAZINE HCL 25 MG PO TABS
25.0000 mg | ORAL_TABLET | Freq: Two times a day (BID) | ORAL | Status: DC
  Administered 2018-07-08 – 2018-07-12 (×8): 25 mg via ORAL
  Filled 2018-07-08 (×8): qty 1

## 2018-07-08 MED ORDER — IRBESARTAN 300 MG PO TABS
300.0000 mg | ORAL_TABLET | Freq: Every day | ORAL | Status: DC
  Administered 2018-07-08 – 2018-07-12 (×4): 300 mg via ORAL
  Filled 2018-07-08 (×4): qty 1

## 2018-07-08 MED ORDER — AMLODIPINE BESYLATE 5 MG PO TABS
5.0000 mg | ORAL_TABLET | Freq: Every day | ORAL | Status: DC
  Administered 2018-07-08 – 2018-07-12 (×4): 5 mg via ORAL
  Filled 2018-07-08 (×4): qty 1

## 2018-07-08 MED ORDER — HYDROCHLOROTHIAZIDE 12.5 MG PO CAPS
12.5000 mg | ORAL_CAPSULE | Freq: Every day | ORAL | Status: DC
  Administered 2018-07-08 – 2018-07-12 (×4): 12.5 mg via ORAL
  Filled 2018-07-08 (×4): qty 1

## 2018-07-08 MED ORDER — IRBESARTAN-HYDROCHLOROTHIAZIDE 300-12.5 MG PO TABS: 1.0000 | ORAL_TABLET | Freq: Every day | ORAL | Status: DC

## 2018-07-08 MED ORDER — CHLORHEXIDINE GLUCONATE 4 % EX LIQD
60.0000 mL | Freq: Once | CUTANEOUS | Status: DC
  Filled 2018-07-08 (×2): qty 60

## 2018-07-08 NOTE — Progress Notes (Signed)
Physical Therapy Treatment Patient Details Name: Jocelyn Kaufman MRN: [id number] DOB: [birth year] Today's Date: 07/08/2018    History of Present Illness Patient is a 62 y/o female who presents s/p fall from home. Also with dizziness and speech difficulties. XRAY of humerus-Displaced spiral fracture of the humeral diaphysis. Head CT, MRI-unremarkable. PMH: DM, HTN, HLD, Depression, Cushings disease.     PT Comments    Patient completed vestibular testing sans positional due to shoulder fracture.  Feel she may have some motion sensitivity which may be improved with outpatient neurorehab once she is done with shoulder rehab.  Improved balance and stability and endurance during ambulation this session not needing HHA.  Feel she will continue to need SNF level rehab upon d/c due to limitations from shoulder and balance and decreased activity tolerance.  PT to follow acutely.  Follow Up Recommendations  SNF;Supervision for mobility/OOB     Equipment Recommendations  Other (comment)(TBA)    Recommendations for Other Services       Precautions / Restrictions Precautions Precautions: Fall Precaution Comments: dizziness Required Braces or Orthoses: Sling Restrictions RUE Weight Bearing: Non weight bearing Other Position/Activity Restrictions: no WB status in chart but documented fx so kept NWB in sling    Mobility  Bed Mobility Overal bed mobility: Needs Assistance Bed Mobility: Supine to Sit;Sit to Supine     Supine to sit: Mod assist Sit to supine: Min assist   General bed mobility comments: lifting assist for trunk; to supine min A for positioning  Transfers Overall transfer level: Needs assistance Equipment used: None   Sit to Stand: Min assist         General transfer comment: for balance  Ambulation/Gait Ambulation/Gait assistance: Min guard Gait Distance (Feet): 100 Feet(x 2)   Gait Pattern/deviations: Step-through pattern;Decreased stride length     General Gait  Details: stopped x 2 with min c/o SOB due to increased pain in shoulder, no UE support during ambulation, but minguard provided for safety & balance   Stairs             Wheelchair Mobility    Modified Rankin (Stroke Patients Only)       Balance Overall balance assessment: Needs assistance Sitting-balance support: Feet supported Sitting balance-Leahy Scale: Good       Standing balance-Leahy Scale: Fair Standing balance comment: no UE support in standing                            Cognition Arousal/Alertness: Awake/alert Behavior During Therapy: WFL for tasks assessed/performed Overall Cognitive Status: Within Functional Limits for tasks assessed                                        Exercises      General Comments General comments (skin integrity, edema, etc.): Adjusted sling for fit  Vestibular Assessment - 07/08/18 1638      Vestibular Assessment   General Observation  Patient reports maybe 10 year history of "dizziness", but most recent bout of "vertigo" last spring when at [place] for her Cushings.  She reports much worse last Saturday and has some feeing of her spinning, but not room spinning around her.       Symptom Behavior   Type of Dizziness  Spinning    Frequency of Dizziness  intermittent    Duration of Dizziness  unknown  Aggravating Factors  No known aggravating factors    Relieving Factors  Lying supine      Occulomotor Exam   Occulomotor Alignment  Normal    Spontaneous  Absent    Gaze-induced  Absent    Head shaking Horizontal  Absent    Smooth Pursuits  Intact    Saccades  Intact      Vestibulo-Occular Reflex   VOR 1 Head Only (x 1 viewing)  intact x 30 sec with some increased symptoms with horizontal head turns, no dizziness with vertical head movements    VOR to Slow Head Movement  Normal    VOR Cancellation  Corrective saccades   bilaterally     Auditory   Comments  muffled in R vs L with scratch test           Pertinent Vitals/Pain Pain Score: 8  Pain Location: R shoulder Pain Descriptors / Indicators: Aching;Sharp;Grimacing;Guarding Pain Intervention(s): RN gave pain meds during session;Repositioned    Home Living                      Prior Function            PT Goals (current goals can now be found in the care plan section) Progress towards PT goals: Progressing toward goals    Frequency    Min 3X/week      PT Plan Current plan remains appropriate    Co-evaluation              AM-PAC PT "6 Clicks" Daily Activity  Outcome Measure  Difficulty turning over in bed (including adjusting bedclothes, sheets and blankets)?: Unable Difficulty moving from lying on back to sitting on the side of the bed? : Unable Difficulty sitting down on and standing up from a chair with arms (e.g., wheelchair, bedside commode, etc,.)?: Unable Help needed moving to and from a bed to chair (including a wheelchair)?: A Little Help needed walking in hospital room?: A Little Help needed climbing 3-5 steps with a railing? : A Lot 6 Click Score: 11    End of Session Equipment Utilized During Treatment: Gait belt;Other (comment)(sling) Activity Tolerance: Patient tolerated treatment well Patient left: in bed;with call bell/phone within reach;with family/visitor present   PT Visit Diagnosis: Unsteadiness on feet (R26.81);Pain;Difficulty in walking, not elsewhere classified (R26.2) Pain - Right/Left: Right Pain - part of body: Arm     Time: 8502-7741 PT Time Calculation (min) (ACUTE ONLY): 27 min  Charges:  $Gait Training: 8-22 mins $Neuromuscular Re-education: 8-22 mins                     Magda Kiel, [place] [facility] [phone] 07/08/2018    Reginia Naas 07/08/2018, 6:02 PM

## 2018-07-08 NOTE — Care Management Note (Signed)
Case Management Note  Patient Details  Name: ANAIA FRITH MRN: [id number] Date of Birth: [birth year]  Subjective/Objective:     Pt admitted with fall at home. She is from home alone.               Action/Plan: Pt to have OR tomorrow for arm fracture. Recommendations are for SNF. CM following for d/c disposition post surgery.  Expected Discharge Date:                  Expected Discharge Plan:  Skilled Nursing Facility  In-House Referral:  Clinical Social Work  Discharge planning Services     Post Acute Care Choice:    Choice offered to:     DME Arranged:    DME Agency:     HH Arranged:    [place] Agency:     Status of Service:  In process, will continue to follow  If discussed at Long Length of Stay Meetings, dates discussed:    Additional Comments:  Pollie Friar, RN 07/08/2018, 11:42 AM

## 2018-07-08 NOTE — Progress Notes (Signed)
PROGRESS NOTE    Jocelyn Kaufman  PFX:[id number] DOB: 03-22-1956 DOA: 07/06/2018 PCP: Deland Pretty, MD    Brief Narrative: 62 year old with past medical history relevant for type 2 diabetes on insulin, anxiety/depression, hypothyroidism, possible adrenal insufficiency, headache disorder, hypertension who came in with vertigo and fall concerning for TIA complicated by humeral fracture on right arm.   Assessment & Plan:   Principal Problem:   Fall at home, initial encounter Active Problems:   Diabetes mellitus ([place])   Essential hypertension   Displaced spiral fracture of shaft of humerus, right arm, initial encounter for closed fracture   TIA (transient ischemic attack)   #) Possible TIA: Presented with possible for expressive aphasia and vertigo. 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 -Orthopedic surgery consult, appreciate recommendations -Tentatively plan for OR on 07/09/2018 -Pain control -OT consult - Stress dose steroids per below  #) Hypertension: -Restart amlodipine 5 mg daily  -Restart HCTZ 12.5 mg daily -Restart irbesartan 300 mg daily -Restart hydralazine 25 mg twice daily  #) Type 2 diabetes on insulin:  - continue glargine 60 units nightly -Sliding scale insulin, AC at bedtime -Hold home dulaglutide 1.5 mg weekly - Hold metformin 1500 mg daily  #) Hypothyroidism: - Continue levothyroxine 125 mcg daily  #)  Pain/psych: -Continue escitalopram 20 mg daily - Continue PRN metallic sound - Continue PRN lorazepam  #) Possible adrenal insufficiency: This is been noted in her chart before.  It is unclear where this diagnosis comes from as she is not on chronic steroids.  Will consider giving 1 day of stress dose steroids if patient is going to the operating room.  Fluids: Tolerating p.o. Electrodes: Monitor and supplement Nutrition: Carb restricted diet  Prophylaxis: Enoxaparin  Disposition: Pending right arm surgery and skilled nursing facility placement  Full code  Consultants:   Neurology  Orthopedic surgery, [facility]  Procedures:  Echo 07/07/2018:  - Left ventricle: The cavity size was normal. There was mild   concentric hypertrophy. Systolic function was normal. The   estimated ejection fraction was in the range of 60% to 65%. Wall   motion was normal; there were no regional wall motion   abnormalities. Doppler parameters are consistent with abnormal   left ventricular relaxation (grade 1 diastolic dysfunction).   Doppler parameters are consistent with high ventricular filling   pressure. - Aortic valve: Transvalvular velocity was within the normal range.   There was no stenosis. There was no regurgitation. - Mitral valve: Calcified annulus. Transvalvular velocity was   within the normal range. There was no evidence for stenosis.   There was no regurgitation. - Left atrium: The atrium was mildly dilated. - Right ventricle: The cavity size was normal. Wall thickness was   normal. Systolic function was normal. - Tricuspid valve: There was trivial regurgitation. - Pulmonary arteries: Systolic pressure was within the normal   range. - Pericardium, extracardiac: A trivial pericardial effusion was   identified.    Vascular ultrasound carotid 07/07/2018:  Final Interpretation: Right Carotid: Velocities in  the right ICA are consistent with a 1-39%  stenosis.  Left Carotid: Velocities in the left ICA are consistent with a 1-39% stenosis.  Vertebrals: Bilateral vertebral arteries demonstrate antegrade flow.  Antimicrobials:   None   Subjective: Patient reports feeling better.  She still has some arm pain.  She denies any nausea, vomiting, diarrhea, cough, congestion.  Objective: Vitals:   07/07/18 1925 07/07/18 2350 07/08/18 0355 07/08/18 0744  BP: (!) 145/74 124/69  140/72  Pulse: 72 78 69 69  Resp: 16 14  14   Temp: 99.1 F (37.3 C) 98.7 F (37.1 C) 98.2 F (36.8 C) 98.1 F (36.7 C)  TempSrc: Oral Oral Oral Oral  SpO2: 97% 96% 99% 99%  Weight:      Height:        Intake/Output Summary (Last 24 hours) at 07/08/2018 1019 Last data filed at 07/07/2018 1600 Gross per 24 hour  Intake 0 ml  Output -  Net 0 ml   Filed Weights   07/06/18 1427 07/06/18 2011  Weight: 104.4 kg 107.2 kg    Examination:  General exam: Appears calm and comfortable  Respiratory system: Clear to auscultation. Respiratory effort normal. Cardiovascular system: Regular rate and rhythm, no murmurs Gastrointestinal system: Abdomen is nondistended, soft and nontender. No organomegaly or masses felt. Normal bowel sounds heard. Central nervous system: Alert and oriented. No focal neurological deficits.  Right upper extremity limited by splint Extremities: Intact distal pulses in right upper extremity trace lower extremity edema Skin: No rashes over visible skin Psychiatry: Judgement and insight appear normal. Mood & affect appropriate.     Data Reviewed: I have personally reviewed following labs and imaging studies  CBC: Recent Labs  Lab 07/06/18 1144 07/08/18 0622  WBC 5.9 6.9  HGB 10.3* 9.2*  HCT 35.0* 32.0*  MCV 91.9 93.3  PLT 215 144   Basic Metabolic Panel: Recent Labs  Lab 07/06/18 1144 07/07/18 0514 07/08/18 0622  NA 138 139 139  K 5.1 4.6 4.5  CL 107 110 108  CO2 20* 20* 22  GLUCOSE 219* 132* 138*  BUN 31* 27* 22   CREATININE 1.31* 1.20* 1.05*  CALCIUM 9.6 9.0 8.9  MG  --   --  1.9   GFR: Estimated Creatinine Clearance: 66.4 mL/min (A) (by C-G formula based on SCr of 1.05 mg/dL (H)). Liver Function Tests: Recent Labs  Lab 07/07/18 0514  AST 30  ALT 47*  ALKPHOS 60  BILITOT 0.7  PROT 6.0*  ALBUMIN 3.3*   No results for input(s): LIPASE, AMYLASE in the last 168 hours. No results for input(s): AMMONIA in the last 168 hours. Coagulation Profile: No results for input(s): INR, PROTIME in the last 168 hours. Cardiac Enzymes: Recent Labs  Lab 07/06/18 1144  TROPONINI <0.03   BNP (last 3 results) No results for input(s): PROBNP in the last 8760 hours. HbA1C: Recent Labs    07/06/18 1957  HGBA1C 7.9*   CBG: Recent Labs  Lab 07/06/18 2233 07/07/18 0609 07/07/18 1120 07/07/18 1555 07/07/18 2107  GLUCAP 107* 135* 209* 134* 200*   Lipid Profile: Recent Labs    07/07/18 0514  CHOL 221*  HDL 42  LDLCALC 126*  TRIG 266*  CHOLHDL 5.3   Thyroid Function Tests: No results for input(s): TSH, T4TOTAL, FREET4, T3FREE, THYROIDAB in the last 72 hours. Anemia Panel: No results for input(s): VITAMINB12, FOLATE, FERRITIN, TIBC, IRON, RETICCTPCT in the last 72 hours. Sepsis Labs: No results for input(s): PROCALCITON, LATICACIDVEN in the last 168  hours.  No results found for this or any previous visit (from the past 240 hour(s)).       Radiology Studies: Dg Chest 2 View  Result Date: 07/06/2018 CLINICAL DATA:  Acute RIGHT chest pain following fall. Initial encounter. EXAM: CHEST - 2 VIEW COMPARISON:  04/05/2017 and prior radiographs FINDINGS: Mild cardiomegaly again noted. There is no evidence of focal airspace disease, pulmonary edema, suspicious pulmonary nodule/mass, pleural effusion, or pneumothorax. No acute bony abnormalities are identified. IMPRESSION: Mild cardiomegaly without evidence of acute cardiopulmonary disease. Electronically Signed   By: Margarette Canada M.D.   On:  07/06/2018 21:34   Dg Shoulder Right  Result Date: 07/06/2018 CLINICAL DATA:  Dizzy.  Fall.  Right arm and shoulder pain. EXAM: RIGHT SHOULDER - 2+ VIEW COMPARISON:  None. FINDINGS: An oblique midshaft right humerus fracture demonstrates at least 15 degrees angulation. Fracture demonstrates displacement of 1/2 the shaft width. Degenerative changes present at the shoulder without additional fractures. IMPRESSION: 1. Oblique fracture of the midshaft right humerus with moderate displacement and angulation. Electronically Signed   By: San Morelle M.D.   On: 07/06/2018 13:49   Ct Head Wo Contrast  Result Date: 07/06/2018 CLINICAL DATA:  Fall.  Dizziness. EXAM: CT HEAD WITHOUT CONTRAST TECHNIQUE: Contiguous axial images were obtained from the base of the skull through the vertex without intravenous contrast. COMPARISON:  None. FINDINGS: Brain: No evidence of acute infarction, hemorrhage, hydrocephalus, extra-axial collection or mass lesion/mass effect. Vascular: Calcified atherosclerosis in the intracranial carotids. Skull: Normal. Negative for fracture or focal lesion. Sinuses/Orbits: No acute finding. Other: None. IMPRESSION: No acute intracranial abnormalities. Electronically Signed   By: Dorise Bullion III M.D   On: 07/06/2018 13:28   Mr Brain Wo Contrast  Result Date: 07/06/2018 CLINICAL DATA:  Dizziness EXAM: MRI HEAD WITHOUT CONTRAST MRA HEAD WITHOUT CONTRAST TECHNIQUE: Multiplanar, multiecho pulse sequences of the brain and surrounding structures were obtained without intravenous contrast. Angiographic images of the head were obtained using MRA technique without contrast. COMPARISON:  Head CT 07/06/2018 FINDINGS: MRI HEAD FINDINGS BRAIN: There is no acute infarct, acute hemorrhage or mass effect. Partially empty sella. There are no old infarcts. The white matter signal is normal for the patient's age. The CSF spaces are normal for age, with no hydrocephalus. Susceptibility-sensitive sequences  show no chronic microhemorrhage or superficial siderosis. SKULL AND UPPER CERVICAL SPINE: The visualized skull base, calvarium, upper cervical spine and extracranial soft tissues are normal. SINUSES/ORBITS: No fluid levels or advanced mucosal thickening. No mastoid or middle ear effusion. The orbits are normal. MRA HEAD FINDINGS Intracranial internal carotid arteries: Moderate-to-severe stenosis of the cavernous segment of the right ICA. Anterior cerebral arteries: Normal. Middle cerebral arteries: Normal. Posterior communicating arteries: Present bilaterally. Posterior cerebral arteries: Normal. Basilar artery: Normal. Vertebral arteries: Left dominant. Normal. Superior cerebellar arteries: Normal. Inferior cerebellar arteries: Normal. IMPRESSION: 1. No acute intracranial abnormality. Normal appearance of the brain for age. 2. 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 Otherwise normal intracranial MRA. Electronically Signed   By: Ulyses Jarred M.D.   On: 07/06/2018 22:26   Dg Humerus Right  Result Date: 07/06/2018 CLINICAL DATA:  Fall.  Right upper arm pain.  Initial encounter. EXAM: RIGHT HUMERUS - 2+ VIEW COMPARISON:  None. FINDINGS: A spiral fracture of the humeral diaphysis is seen with medial displacement and angulation of the distal fracture fragment. Generalized osteopenia noted. IMPRESSION: Displaced spiral fracture of the humeral diaphysis. Electronically Signed   By: Sharrie Rothman.D.  On: 07/06/2018 13:49   Mr Jodene Nam Head Wo Contrast  Result Date: 07/06/2018 CLINICAL DATA:  Dizziness EXAM: MRI HEAD WITHOUT CONTRAST MRA HEAD WITHOUT CONTRAST TECHNIQUE: Multiplanar, multiecho pulse sequences of the brain and surrounding structures were obtained without intravenous contrast. Angiographic images of the head were obtained using MRA technique without contrast. COMPARISON:  Head CT 07/06/2018 FINDINGS: MRI HEAD FINDINGS BRAIN: There is no acute infarct, acute  hemorrhage or mass effect. Partially empty sella. There are no old infarcts. The white matter signal is normal for the patient's age. The CSF spaces are normal for age, with no hydrocephalus. Susceptibility-sensitive sequences show no chronic microhemorrhage or superficial siderosis. SKULL AND UPPER CERVICAL SPINE: The visualized skull base, calvarium, upper cervical spine and extracranial soft tissues are normal. SINUSES/ORBITS: No fluid levels or advanced mucosal thickening. No mastoid or middle ear effusion. The orbits are normal. MRA HEAD FINDINGS Intracranial internal carotid arteries: Moderate-to-severe stenosis of the cavernous segment of the right ICA. Anterior cerebral arteries: Normal. Middle cerebral arteries: Normal. Posterior communicating arteries: Present bilaterally. Posterior cerebral arteries: Normal. Basilar artery: Normal. Vertebral arteries: Left dominant. Normal. Superior cerebellar arteries: Normal. Inferior cerebellar arteries: Normal. IMPRESSION: 1. No acute intracranial abnormality. Normal appearance of the brain for age. 2. 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 Otherwise normal intracranial MRA. Electronically Signed   By: Ulyses Jarred M.D.   On: 07/06/2018 22:26        Scheduled Meds: . amLODipine  5 mg Oral Daily  . aspirin  300 mg Rectal Daily   Or  . aspirin  325 mg Oral Daily  . atorvastatin  40 mg Oral q1800  . enoxaparin (LOVENOX) injection  40 mg Subcutaneous Q24H  . escitalopram  20 mg Oral Daily  . ferrous sulfate  325 mg Oral Daily  . folic acid  2 mg Oral Daily  . hydrALAZINE  25 mg Oral BID  . irbesartan  300 mg Oral Daily   And  . hydrochlorothiazide  12.5 mg Oral Daily  . insulin aspart  0-15 Units Subcutaneous TID WC  . insulin glargine  60 Units Subcutaneous QHS  . latanoprost  1 drop Both Eyes Daily  . levothyroxine  125 mcg Oral QAC breakfast  . loratadine  10 mg Oral Daily  . magnesium gluconate   500 mg Oral Daily  . multivitamin with minerals  1 tablet Oral Daily  . vitamin B-12  100 mcg Oral Daily  . Vitamin D (Ergocalciferol)  50,000 Units Oral Q Mon   Continuous Infusions: . sodium chloride       LOS: 2 days    Time spent: [place], MD [facility]  If 7PM-7AM, please contact night-coverage [email or web address] Password [email or web address] 07/08/2018, 10:19 AM

## 2018-07-09 ENCOUNTER — Encounter (HOSPITAL_COMMUNITY): Payer: Self-pay | Admitting: Certified Registered"

## 2018-07-09 ENCOUNTER — Inpatient Hospital Stay (HOSPITAL_COMMUNITY): Payer: BC Managed Care – PPO

## 2018-07-09 ENCOUNTER — Encounter (HOSPITAL_COMMUNITY)
Admission: EM | Disposition: A | Discharge status: Skilled Nursing Facility | Payer: Self-pay | Source: Home / Self Care | Attending: Internal Medicine

## 2018-07-09 ENCOUNTER — Inpatient Hospital Stay (HOSPITAL_COMMUNITY): Payer: BC Managed Care – PPO | Admitting: Certified Registered"

## 2018-07-09 DIAGNOSIS — Y92009 Unspecified place in unspecified non-institutional (private) residence as the place of occurrence of the external cause: Secondary | ICD-10-CM

## 2018-07-09 DIAGNOSIS — I10 Essential (primary) hypertension: Secondary | ICD-10-CM

## 2018-07-09 DIAGNOSIS — S42341A Displaced spiral fracture of shaft of humerus, right arm, initial encounter for closed fracture: Principal | ICD-10-CM

## 2018-07-09 DIAGNOSIS — W19XXXA Unspecified fall, initial encounter: Secondary | ICD-10-CM

## 2018-07-09 HISTORY — PX: ORIF HUMERUS FRACTURE: SHX2126

## 2018-07-09 LAB — GLUCOSE, CAPILLARY
Glucose-Capillary: 162 mg/dL — ABNORMAL HIGH (ref 70–99)
Glucose-Capillary: 115 mg/dL — ABNORMAL HIGH (ref 70–99)
Glucose-Capillary: 105 mg/dL — ABNORMAL HIGH (ref 70–99)
Glucose-Capillary: 181 mg/dL — ABNORMAL HIGH (ref 70–99)
Glucose-Capillary: 354 mg/dL — ABNORMAL HIGH (ref 70–99)

## 2018-07-09 LAB — BASIC METABOLIC PANEL
Anion gap: 11 (ref 5–15)
BUN: 22 mg/dL (ref 8–23)
CO2: 23 mmol/L (ref 22–32)
Calcium: 9.5 mg/dL (ref 8.9–10.3)
Chloride: 105 mmol/L (ref 98–111)
Creatinine, Ser: 0.97 mg/dL (ref 0.44–1.00)
GFR calc Af Amer: >60 mL/min (ref >60)
GFR calc non Af Amer: >60 mL/min (ref >60)
Glucose, Bld: 141 mg/dL — ABNORMAL HIGH (ref 70–99)
Potassium: 4.6 mmol/L (ref 3.5–5.1)
Sodium: 139 mmol/L (ref 135–145)

## 2018-07-09 LAB — CBC
HCT: 35.5 % — ABNORMAL LOW (ref 36.0–46.0)
Hemoglobin: 10.6 g/dL — ABNORMAL LOW (ref 12.0–15.0)
MCH: 27 pg (ref 26.0–34.0)
MCHC: 29.9 g/dL — ABNORMAL LOW (ref 30.0–36.0)
MCV: 90.3 fL (ref 78.0–100.0)
Platelets: 151 10*3/uL (ref 150–400)
RBC: 3.93 MIL/uL (ref 3.87–5.11)
RDW: 14.2 % (ref 11.5–15.5)
WBC: 7 10*3/uL (ref 4.0–10.5)

## 2018-07-09 LAB — MRSA PCR SCREENING: MRSA by PCR: NEGATIVE

## 2018-07-09 SURGERY — OPEN REDUCTION INTERNAL FIXATION (ORIF) HUMERAL SHAFT FRACTURE
Anesthesia: General | Laterality: Right

## 2018-07-09 MED ORDER — TOBRAMYCIN SULFATE 1.2 G IJ SOLR
INTRAMUSCULAR | Status: DC | PRN
  Administered 2018-07-09: 1.2 g via TOPICAL

## 2018-07-09 MED ORDER — METOCLOPRAMIDE HCL 5 MG/ML IJ SOLN: 5.0000 mg | Freq: Three times a day (TID) | INTRAMUSCULAR | Status: DC | PRN

## 2018-07-09 MED ORDER — OXYCODONE HCL 5 MG PO TABS
5.0000 mg | ORAL_TABLET | ORAL | Status: DC | PRN
  Administered 2018-07-09 – 2018-07-12 (×10): 10 mg via ORAL
  Filled 2018-07-09 (×10): qty 2

## 2018-07-09 MED ORDER — CEFAZOLIN SODIUM-DEXTROSE 2-4 GM/100ML-% IV SOLN
2.0000 g | INTRAVENOUS | Status: AC
  Administered 2018-07-09: 2 g via INTRAVENOUS
  Filled 2018-07-09: qty 100

## 2018-07-09 MED ORDER — LACTATED RINGERS IV SOLN
INTRAVENOUS | Status: DC | PRN
  Administered 2018-07-09 (×2): via INTRAVENOUS

## 2018-07-09 MED ORDER — DEXAMETHASONE SODIUM PHOSPHATE 10 MG/ML IJ SOLN
INTRAMUSCULAR | Status: DC | PRN
  Administered 2018-07-09: 10 mg via INTRAVENOUS

## 2018-07-09 MED ORDER — PROMETHAZINE HCL 25 MG/ML IJ SOLN: 6.2500 mg | INTRAMUSCULAR | Status: DC | PRN

## 2018-07-09 MED ORDER — GLYCOPYRROLATE 0.2 MG/ML IJ SOLN
INTRAMUSCULAR | Status: DC | PRN
  Administered 2018-07-09 (×2): 0.1 mg via INTRAVENOUS

## 2018-07-09 MED ORDER — PHENYLEPHRINE 40 MCG/ML (10ML) SYRINGE FOR IV PUSH (FOR BLOOD PRESSURE SUPPORT)
PREFILLED_SYRINGE | INTRAVENOUS | Status: DC | PRN
  Administered 2018-07-09 (×3): 40 ug via INTRAVENOUS
  Administered 2018-07-09: 120 ug via INTRAVENOUS

## 2018-07-09 MED ORDER — TOBRAMYCIN SULFATE 1.2 G IJ SOLR
INTRAMUSCULAR | Status: AC
  Filled 2018-07-09: qty 1.2

## 2018-07-09 MED ORDER — BUPIVACAINE HCL (PF) 0.25 % IJ SOLN
INTRAMUSCULAR | Status: DC | PRN
  Administered 2018-07-09: 30 mL

## 2018-07-09 MED ORDER — METOCLOPRAMIDE HCL 5 MG PO TABS: 5.0000 mg | ORAL_TABLET | Freq: Three times a day (TID) | ORAL | Status: DC | PRN

## 2018-07-09 MED ORDER — SODIUM CHLORIDE 0.9 % IV SOLN
INTRAVENOUS | Status: DC | PRN
  Administered 2018-07-09: 50 ug/min via INTRAVENOUS

## 2018-07-09 MED ORDER — HYDROMORPHONE HCL 1 MG/ML IJ SOLN
0.2500 mg | INTRAMUSCULAR | Status: DC | PRN
  Administered 2018-07-09 (×2): 0.5 mg via INTRAVENOUS

## 2018-07-09 MED ORDER — HYDROMORPHONE HCL 1 MG/ML IJ SOLN
INTRAMUSCULAR | Status: AC
  Filled 2018-07-09: qty 1

## 2018-07-09 MED ORDER — VANCOMYCIN HCL 1000 MG IV SOLR
INTRAVENOUS | Status: DC | PRN
  Administered 2018-07-09: 1000 mg via TOPICAL

## 2018-07-09 MED ORDER — SODIUM CHLORIDE 0.9 % IR SOLN
Status: DC | PRN
  Administered 2018-07-09: 1000 mL

## 2018-07-09 MED ORDER — HYDROMORPHONE HCL 1 MG/ML IJ SOLN
0.5000 mg | INTRAMUSCULAR | Status: DC | PRN
  Administered 2018-07-09 – 2018-07-10 (×4): 1 mg via INTRAVENOUS
  Filled 2018-07-09 (×4): qty 1

## 2018-07-09 MED ORDER — ACETAMINOPHEN 500 MG PO TABS
1000.0000 mg | ORAL_TABLET | Freq: Three times a day (TID) | ORAL | Status: DC
  Administered 2018-07-09 – 2018-07-12 (×9): 1000 mg via ORAL
  Filled 2018-07-09 (×9): qty 2

## 2018-07-09 MED ORDER — BUPIVACAINE HCL (PF) 0.25 % IJ SOLN
INTRAMUSCULAR | Status: AC
  Filled 2018-07-09: qty 30

## 2018-07-09 MED ORDER — GLYCOPYRROLATE PF 0.2 MG/ML IJ SOSY
PREFILLED_SYRINGE | INTRAMUSCULAR | Status: AC
  Filled 2018-07-09: qty 1

## 2018-07-09 MED ORDER — LACTATED RINGERS IV SOLN: INTRAVENOUS | Status: DC

## 2018-07-09 MED ORDER — LIDOCAINE 2% (20 MG/ML) 5 ML SYRINGE
INTRAMUSCULAR | Status: DC | PRN
  Administered 2018-07-09: 80 mg via INTRAVENOUS

## 2018-07-09 MED ORDER — DOCUSATE SODIUM 100 MG PO CAPS
100.0000 mg | ORAL_CAPSULE | Freq: Two times a day (BID) | ORAL | Status: DC
  Administered 2018-07-09 – 2018-07-12 (×6): 100 mg via ORAL
  Filled 2018-07-09 (×6): qty 1

## 2018-07-09 MED ORDER — FENTANYL CITRATE (PF) 100 MCG/2ML IJ SOLN
INTRAMUSCULAR | Status: DC | PRN
  Administered 2018-07-09 (×5): 50 ug via INTRAVENOUS

## 2018-07-09 MED ORDER — CEFAZOLIN SODIUM-DEXTROSE 1-4 GM/50ML-% IV SOLN
1.0000 g | Freq: Four times a day (QID) | INTRAVENOUS | Status: AC
  Administered 2018-07-09 – 2018-07-10 (×3): 1 g via INTRAVENOUS
  Filled 2018-07-09 (×3): qty 50

## 2018-07-09 MED ORDER — PROPOFOL 10 MG/ML IV BOLUS
INTRAVENOUS | Status: AC
  Filled 2018-07-09: qty 20

## 2018-07-09 MED ORDER — ONDANSETRON HCL 4 MG/2ML IJ SOLN
INTRAMUSCULAR | Status: DC | PRN
  Administered 2018-07-09: 4 mg via INTRAVENOUS

## 2018-07-09 MED ORDER — FENTANYL CITRATE (PF) 250 MCG/5ML IJ SOLN
INTRAMUSCULAR | Status: AC
  Filled 2018-07-09: qty 5

## 2018-07-09 MED ORDER — LIDOCAINE 2% (20 MG/ML) 5 ML SYRINGE
INTRAMUSCULAR | Status: AC
  Filled 2018-07-09: qty 5

## 2018-07-09 MED ORDER — PROPOFOL 10 MG/ML IV BOLUS
INTRAVENOUS | Status: DC | PRN
  Administered 2018-07-09: 180 mg via INTRAVENOUS

## 2018-07-09 MED ORDER — ROCURONIUM BROMIDE 50 MG/5ML IV SOSY
PREFILLED_SYRINGE | INTRAVENOUS | Status: AC
  Filled 2018-07-09: qty 5

## 2018-07-09 MED ORDER — MIDAZOLAM HCL 2 MG/2ML IJ SOLN
INTRAMUSCULAR | Status: AC
  Filled 2018-07-09: qty 2

## 2018-07-09 MED ORDER — INSULIN ASPART 100 UNIT/ML ~~LOC~~ SOLN
5.0000 [IU] | Freq: Once | SUBCUTANEOUS | Status: AC
  Administered 2018-07-09: 5 [IU] via SUBCUTANEOUS

## 2018-07-09 MED ORDER — ONDANSETRON HCL 4 MG/2ML IJ SOLN
INTRAMUSCULAR | Status: AC
  Filled 2018-07-09: qty 2

## 2018-07-09 MED ORDER — SUGAMMADEX SODIUM 200 MG/2ML IV SOLN
INTRAVENOUS | Status: DC | PRN
  Administered 2018-07-09: 200 mg via INTRAVENOUS

## 2018-07-09 MED ORDER — VANCOMYCIN HCL 1000 MG IV SOLR
INTRAVENOUS | Status: AC
  Filled 2018-07-09: qty 1000

## 2018-07-09 MED ORDER — MEPERIDINE HCL 50 MG/ML IJ SOLN: 6.2500 mg | INTRAMUSCULAR | Status: DC | PRN

## 2018-07-09 SURGICAL SUPPLY — 50 items
AID PSTN UNV HD RSTRNT DISP (MISCELLANEOUS) ×1
BIT DRILL 2.5X2.75 QC CALB (BIT) ×1 IMPLANT
BIT DRILL 3.5X5.5 QC CALB (BIT) ×1 IMPLANT
BIT DRILL QC 3.3X195 (BIT) ×1 IMPLANT
CAP LOCK NCB (Cap) ×5 IMPLANT
CHLORAPREP W/TINT 26ML (MISCELLANEOUS) ×3 IMPLANT
COVER SURGICAL LIGHT HANDLE (MISCELLANEOUS) ×3 IMPLANT
DRAPE C-ARM 42X72 X-RAY (DRAPES) ×2 IMPLANT
DRAPE HALF SHEET 40X57 (DRAPES) ×2 IMPLANT
DRAPE IMP U-DRAPE 54X76 (DRAPES) ×2 IMPLANT
DRAPE ORTHO SPLIT 77X108 STRL (DRAPES) ×4
DRAPE SURG 17X23 STRL (DRAPES) ×2 IMPLANT
DRAPE SURG ORHT 6 SPLT 77X108 (DRAPES) ×2 IMPLANT
DRAPE U-SHAPE 47X51 STRL (DRAPES) ×2 IMPLANT
DRSG AQUACEL AG ADV 3.5X10 (GAUZE/BANDAGES/DRESSINGS) ×1 IMPLANT
ELECT BLADE 4.0 EZ CLEAN MEGAD (MISCELLANEOUS)
ELECT REM PT RETURN 9FT ADLT (ELECTROSURGICAL) ×2
ELECTRODE BLDE 4.0 EZ CLN MEGD (MISCELLANEOUS) IMPLANT
ELECTRODE REM PT RTRN 9FT ADLT (ELECTROSURGICAL) ×1 IMPLANT
GLOVE BIOGEL PI IND STRL 8 (GLOVE) ×1 IMPLANT
GLOVE BIOGEL PI INDICATOR 8 (GLOVE) ×1
GLOVE ECLIPSE 8.0 STRL XLNG CF (GLOVE) ×2 IMPLANT
GOWN STRL REUS W/ TWL LRG LVL3 (GOWN DISPOSABLE) ×3 IMPLANT
GOWN STRL REUS W/ TWL XL LVL3 (GOWN DISPOSABLE) ×1 IMPLANT
GOWN STRL REUS W/TWL LRG LVL3 (GOWN DISPOSABLE) ×6
GOWN STRL REUS W/TWL XL LVL3 (GOWN DISPOSABLE) ×2
KIT BASIN OR (CUSTOM PROCEDURE TRAY) ×2 IMPLANT
KIT TURNOVER KIT B (KITS) ×2 IMPLANT
MANIFOLD NEPTUNE II (INSTRUMENTS) ×2 IMPLANT
NEEDLE 22X1 1/2 (OR ONLY) (NEEDLE) ×1 IMPLANT
NS IRRIG 1000ML POUR BTL (IV SOLUTION) ×2 IMPLANT
PACK SHOULDER (CUSTOM PROCEDURE TRAY) ×2 IMPLANT
PAD ARMBOARD 7.5X6 YLW CONV (MISCELLANEOUS) ×4 IMPLANT
PLATE STRT NCB PA 202 14H (Plate) ×1 IMPLANT
RESTRAINT HEAD UNIVERSAL NS (MISCELLANEOUS) ×2 IMPLANT
SCREW CORTICAL 3.5MM  44MM (Screw) ×1 IMPLANT
SCREW CORTICAL 3.5MM 24MM (Screw) ×2 IMPLANT
SCREW CORTICAL 3.5MM 44MM (Screw) IMPLANT
SCREW NCB 4.0 28MM (Screw) ×2 IMPLANT
SCREW NCB 4.0MX38M (Screw) ×1 IMPLANT
SCREW NCB 4.0X24MM (Screw) ×2 IMPLANT
SCREW NCB 4.0X26MM (Screw) ×3 IMPLANT
SUCTION FRAZIER HANDLE 10FR (MISCELLANEOUS) ×1
SUCTION TUBE FRAZIER 10FR DISP (MISCELLANEOUS) ×1 IMPLANT
SUT VIC AB 2-0 CT1 27 (SUTURE) ×4
SUT VIC AB 2-0 CT1 TAPERPNT 27 (SUTURE) ×2 IMPLANT
SYR CONTROL 10ML LL (SYRINGE) IMPLANT
TAPE STRIPS DRAPE STRL (GAUZE/BANDAGES/DRESSINGS) ×1 IMPLANT
TUBE CONNECTING 12X1/4 (SUCTIONS) ×2 IMPLANT
YANKAUER SUCT BULB TIP NO VENT (SUCTIONS) ×2 IMPLANT

## 2018-07-09 NOTE — Interval H&P Note (Signed)
She was seen in the room per her current request and we again discussed the plan as her plan was on Monday we cleared her surgery with her medical team as of yesterday afternoon and plan was to dicuss in the preop area to answer any lingering questions she has.  An extensive discussion with patient on Monday for approximately 20 to 30 minutes about and of care and we went over that again today.    She was upset the morning of surgery but we talked about surgery again per her request.  I offered to have 1 of my partners perform her surgery if she felt more comfortable with that 3 times and she elected to proceed with me.  We will plan for ORIF of the humerus again discussed specific risk of infection wound healing issues and both fracture around the prosthesis, infection and radial nerve injury.  She understood these things like to proceed.

## 2018-07-09 NOTE — Anesthesia Preprocedure Evaluation (Signed)
Anesthesia Evaluation  Patient identified by MRN, date of birth, ID band Patient awake    Reviewed: Allergy & Precautions, NPO status , Patient's Chart, lab work & pertinent test results  Airway Mallampati: I  TM Distance: >3 FB Neck ROM: Full    Dental  (+) Teeth Intact, Dental Advisory Given   Pulmonary asthma ,    breath sounds clear to auscultation       Cardiovascular hypertension, Pt. on medications  Rhythm:Regular Rate:Normal     Neuro/Psych  Headaches, Depression TIA   GI/Hepatic negative GI ROS, Neg liver ROS,   Endo/Other  diabetes, Type 2, Oral Hypoglycemic AgentsHypothyroidism   Renal/GU      Musculoskeletal  (+) Arthritis ,   Abdominal (+) + obese,   Peds  Hematology   Anesthesia Other Findings   Reproductive/Obstetrics                             Lab Results  Component Value Date   WBC 7.0 07/09/2018   HGB 10.6 (L) 07/09/2018   HCT 35.5 (L) 07/09/2018   MCV 90.3 07/09/2018   PLT 151 07/09/2018   Lab Results  Component Value Date   CREATININE 0.97 07/09/2018   BUN 22 07/09/2018   NA 139 07/09/2018   K 4.6 07/09/2018   CL 105 07/09/2018   CO2 23 07/09/2018   Echo: - Left ventricle: The cavity size was normal. There was mild   concentric hypertrophy. Systolic function was normal. The   estimated ejection fraction was in the range of 60% to 65%. Wall   motion was normal; there were no regional wall motion   abnormalities. Doppler parameters are consistent with abnormal   left ventricular relaxation (grade 1 diastolic dysfunction).   Doppler parameters are consistent with high ventricular filling   pressure. - Aortic valve: Transvalvular velocity was within the normal range.   There was no stenosis. There was no regurgitation. - Mitral valve: Calcified annulus. Transvalvular velocity was   within the normal range. There was no evidence for stenosis.   There was no  regurgitation. - Left atrium: The atrium was mildly dilated. - Right ventricle: The cavity size was normal. Wall thickness was   normal. Systolic function was normal. - Tricuspid valve: There was trivial regurgitation. - Pulmonary arteries: Systolic pressure was within the normal   range. - Pericardium, extracardiac: A trivial pericardial effusion was   identified.  Anesthesia Physical Anesthesia Plan  ASA: III  Anesthesia Plan: General   Post-op Pain Management:    Induction: Intravenous  PONV Risk Score and Plan: 4 or greater and Ondansetron, Dexamethasone, Midazolam and Treatment may vary due to age or medical condition  Airway Management Planned: Oral ETT  Additional Equipment: None  Intra-op Plan:   Post-operative Plan: Extubation in OR  Informed Consent: I have reviewed the patients History and Physical, chart, labs and discussed the procedure including the risks, benefits and alternatives for the proposed anesthesia with the patient or authorized representative who has indicated his/her understanding and acceptance.   Dental advisory given  Plan Discussed with: CRNA  Anesthesia Plan Comments: (Consented for post op block if pain not controlled. )        Anesthesia Quick Evaluation

## 2018-07-09 NOTE — Progress Notes (Signed)
[facility]                                                                              Patient Demographics  Jocelyn Kaufman, is a 62 y.o. female, DOB - 01/16/1956, ZES:[id number]  Admit date - 07/06/2018   Admitting Physician Cristal Deer, MD  Outpatient Primary MD for the patient is Deland Pretty, MD  Outpatient specialists:   LOS - 3  days   Medical records reviewed and are as summarized below:    Chief Complaint  Patient presents with  . Fall  . Dizziness  . Arm Pain  . Head Injury       Brief summary   62 year old with past medical history relevant for type 2 diabetes on insulin, anxiety/depression, hypothyroidism, possible adrenal insufficiency, headache disorder, hypertension who came in with vertigo and fall concerning for TIA complicated by humeral fracture on right arm.   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 -LDL 126, continue Lipitor 40 mg daily -Hemoglobin A1c 7.9, continue aspirin 325 mg daily -Neurology consulted. -PT OT consult recommended skilled nursing facility for rehab  Hypertension -BP currently stable, continue amlodipine, HCTZ, irbesartan, hydralazine  Type 2 diabetes mellitus, insulin-dependent, uncontrolled with hyperglycemia -Hemoglobin A1c 7.9 -Continue Lantus, sliding scale insulin -For now hold Trulicity, metformin   Hypothyroidism -Continue Synthroid  Depression -Stable, no SI/HI, continue Lexapro 20 mg daily  Possible adrenal  insufficiency -Questionable diagnosis, not on chronic steroids outpatient   Code Status: Full CODE STATUS DVT Prophylaxis:  Lovenox  Family Communication: Discussed in detail with the patient, all imaging results, lab results explained to the patient    Disposition Plan: Pending OR  Time Spent in minutes 35 minutes  Procedures:  MRI/MRA, 2D echo, carotid Dopplers  Consultants:   Neurology Orthopedics  Antimicrobials:   None   Medications  Scheduled Meds: . amLODipine  5 mg Oral Daily  . aspirin  300 mg Rectal Daily   Or  . aspirin  325 mg Oral Daily  . atorvastatin  40 mg Oral q1800  . chlorhexidine  60 mL Topical Once  . enoxaparin (LOVENOX) injection  40 mg Subcutaneous Q24H  . escitalopram  20 mg Oral Daily  . ferrous sulfate  325 mg Oral Daily  . folic acid  2 mg Oral Daily  . hydrALAZINE  25 mg Oral BID  . irbesartan  300 mg Oral Daily   And  . hydrochlorothiazide  12.5 mg Oral Daily  . insulin aspart  0-15 Units Subcutaneous TID WC  . insulin glargine  60 Units Subcutaneous QHS  . latanoprost  1 drop Both Eyes Daily  . levothyroxine  125 mcg Oral QAC breakfast  . loratadine  10 mg Oral Daily  .  magnesium gluconate  500 mg Oral Daily  . multivitamin with minerals  1 tablet Oral Daily  . vitamin B-12  100 mcg Oral Daily  . Vitamin D (Ergocalciferol)  50,000 Units Oral Q Mon   Continuous Infusions: . sodium chloride    .  ceFAZolin (ANCEF) IV     PRN Meds:.acetaminophen **OR** acetaminophen (TYLENOL) oral liquid 160 mg/5 mL **OR** acetaminophen, albuterol, aspirin-acetaminophen-caffeine, ibuprofen, LORazepam, metaxalone, morphine injection, ondansetron, senna-docusate   Antibiotics   Anti-infectives (From admission, onward)   Start     Dose/Rate Route Frequency Ordered Stop   07/09/18 0730  ceFAZolin (ANCEF) IVPB 2g/100 mL premix     2 g 200 mL/hr over 30 Minutes Intravenous To Short Stay 07/09/18 0707 07/10/18 0730        Subjective:    Jocelyn Kaufman was seen and examined today.  awaiting surgery.  However wants orthopedics to explain the procedure to her.  Right arm pain 7/10, sharp and constant, in sling. Patient denies dizziness, chest pain, shortness of breath, abdominal pain, N/V/D/C, new weakness, numbess, tingling. No acute events overnight.    Objective:   Vitals:   07/08/18 2031 07/09/18 0036 07/09/18 0444 07/09/18 0736  BP: 132/66 140/69 133/67 (!) 142/57  Pulse: 73 72 61 65  Resp: 18 18 16 12   Temp: 98.5 F (36.9 C) 98 F (36.7 C) 98.2 F (36.8 C)   TempSrc: Oral Oral Oral   SpO2: 99% 97% 99%   Weight:      Height:       No intake or output data in the 24 hours ending 07/09/18 0906   Wt Readings from Last 3 Encounters:  07/06/18 107.2 kg  07/04/18 104.4 kg  06/27/18 105.9 kg     Exam  General: Alert and oriented x 3, NAD  Eyes:   HEENT:  Atraumatic, normocephalic  Cardiovascular: S1 S2 auscultated, no rubs, murmurs or gallops. Regular rate and rhythm.  Respiratory: Clear to auscultation bilaterally, no wheezing, rales or rhonchi  Gastrointestinal: Soft, nontender, nondistended, + bowel sounds  Ext: no pedal edema bilaterally, right arm in sling  Neuro: no new focal neurological deficits  Musculoskeletal: No digital cyanosis, clubbing  Skin: No rashes  Psych: Normal affect and demeanor, alert and oriented x3    Data Reviewed:  I have personally reviewed following labs and imaging studies  Micro Results Recent Results (from the past 240 hour(s))  MRSA PCR Screening     Status: None   Collection Time: 07/09/18  4:07 AM  Result Value Ref Range Status   MRSA by PCR NEGATIVE NEGATIVE Final    Comment:        The GeneXpert MRSA Assay (FDA approved for NASAL specimens only), is one component of a comprehensive MRSA colonization surveillance program. It is not intended to diagnose MRSA infection nor to guide or monitor treatment for MRSA infections. Performed at [facility], [place] [address] [place], [place] [address]     Radiology Reports Dg Chest 2 View  Result Date: 07/06/2018 CLINICAL DATA:  Acute RIGHT chest pain following fall. Initial encounter. EXAM: CHEST - 2 VIEW COMPARISON:  04/05/2017 and prior radiographs FINDINGS: Mild cardiomegaly again noted. There is no evidence of focal airspace disease, pulmonary edema, suspicious pulmonary nodule/mass, pleural effusion, or pneumothorax. No acute bony abnormalities are identified. IMPRESSION: Mild cardiomegaly without evidence of acute cardiopulmonary disease. Electronically Signed   By: Margarette Canada M.D.   On: 07/06/2018 21:34   Dg Shoulder Right  Result Date: 07/06/2018 CLINICAL DATA:  Dizzy.  Fall.  Right arm and shoulder pain. EXAM: RIGHT SHOULDER - 2+ VIEW COMPARISON:  None. FINDINGS: An oblique midshaft right humerus fracture demonstrates at least 15 degrees angulation. Fracture demonstrates displacement of 1/2 the shaft width. Degenerative changes present at the shoulder without additional fractures. IMPRESSION: 1. Oblique fracture of the midshaft right humerus with moderate displacement and angulation. Electronically Signed   By: San Morelle M.D.   On: 07/06/2018 13:49   Ct Head Wo Contrast  Result Date: 07/06/2018 CLINICAL DATA:  Fall.  Dizziness. EXAM: CT HEAD WITHOUT CONTRAST TECHNIQUE: Contiguous axial images were obtained from the base of the skull through the vertex without intravenous contrast. COMPARISON:  None. FINDINGS: Brain: No evidence of acute infarction, hemorrhage, hydrocephalus, extra-axial collection or mass lesion/mass effect. Vascular: Calcified atherosclerosis in the intracranial carotids. Skull: Normal. Negative for fracture or focal lesion. Sinuses/Orbits: No acute finding. Other: None. IMPRESSION: No acute intracranial abnormalities. Electronically Signed   By: Dorise Bullion III M.D   On: 07/06/2018 13:28   Mr Brain Wo Contrast  Result Date: 07/06/2018 CLINICAL DATA:   Dizziness EXAM: MRI HEAD WITHOUT CONTRAST MRA HEAD WITHOUT CONTRAST TECHNIQUE: Multiplanar, multiecho pulse sequences of the brain and surrounding structures were obtained without intravenous contrast. Angiographic images of the head were obtained using MRA technique without contrast. COMPARISON:  Head CT 07/06/2018 FINDINGS: MRI HEAD FINDINGS BRAIN: There is no acute infarct, acute hemorrhage or mass effect. Partially empty sella. There are no old infarcts. The white matter signal is normal for the patient's age. The CSF spaces are normal for age, with no hydrocephalus. Susceptibility-sensitive sequences show no chronic microhemorrhage or superficial siderosis. SKULL AND UPPER CERVICAL SPINE: The visualized skull base, calvarium, upper cervical spine and extracranial soft tissues are normal. SINUSES/ORBITS: No fluid levels or advanced mucosal thickening. No mastoid or middle ear effusion. The orbits are normal. MRA HEAD FINDINGS Intracranial internal carotid arteries: Moderate-to-severe stenosis of the cavernous segment of the right ICA. Anterior cerebral arteries: Normal. Middle cerebral arteries: Normal. Posterior communicating arteries: Present bilaterally. Posterior cerebral arteries: Normal. Basilar artery: Normal. Vertebral arteries: Left dominant. Normal. Superior cerebellar arteries: Normal. Inferior cerebellar arteries: Normal. IMPRESSION: 1. No acute intracranial abnormality. Normal appearance of the brain for age. 2. 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 Otherwise normal intracranial MRA. Electronically Signed   By: Ulyses Jarred M.D.   On: 07/06/2018 22:26   Dg Humerus Right  Result Date: 07/06/2018 CLINICAL DATA:  Fall.  Right upper arm pain.  Initial encounter. EXAM: RIGHT HUMERUS - 2+ VIEW COMPARISON:  None. FINDINGS: A spiral fracture of the humeral diaphysis is seen with medial displacement and angulation of the distal fracture fragment. Generalized  osteopenia noted. IMPRESSION: Displaced spiral fracture of the humeral diaphysis. Electronically Signed   By: Earle Gell M.D.   On: 07/06/2018 13:49   Mr Jodene Nam Head Wo Contrast  Result Date: 07/06/2018 CLINICAL DATA:  Dizziness EXAM: MRI HEAD WITHOUT CONTRAST MRA HEAD WITHOUT CONTRAST TECHNIQUE: Multiplanar, multiecho pulse sequences of the brain and surrounding structures were obtained without intravenous contrast. Angiographic images of the head were obtained using MRA technique without contrast. COMPARISON:  Head CT 07/06/2018 FINDINGS: MRI HEAD FINDINGS BRAIN: There is no acute infarct, acute hemorrhage or mass effect. Partially empty sella. There are no old infarcts. The white matter signal is normal for the patient's age. The CSF spaces are normal for age, with no hydrocephalus. Susceptibility-sensitive sequences show no chronic microhemorrhage  or superficial siderosis. SKULL AND UPPER CERVICAL SPINE: The visualized skull base, calvarium, upper cervical spine and extracranial soft tissues are normal. SINUSES/ORBITS: No fluid levels or advanced mucosal thickening. No mastoid or middle ear effusion. The orbits are normal. MRA HEAD FINDINGS Intracranial internal carotid arteries: Moderate-to-severe stenosis of the cavernous segment of the right ICA. Anterior cerebral arteries: Normal. Middle cerebral arteries: Normal. Posterior communicating arteries: Present bilaterally. Posterior cerebral arteries: Normal. Basilar artery: Normal. Vertebral arteries: Left dominant. Normal. Superior cerebellar arteries: Normal. Inferior cerebellar arteries: Normal. IMPRESSION: 1. No acute intracranial abnormality. Normal appearance of the brain for age. 2. 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 Otherwise normal intracranial MRA. Electronically Signed   By: Ulyses Jarred M.D.   On: 07/06/2018 22:26    Lab Data:  CBC: Recent Labs  Lab 07/06/18 1144 07/08/18 0622 07/09/18 0407   WBC 5.9 6.9 7.0  HGB 10.3* 9.2* 10.6*  HCT 35.0* 32.0* 35.5*  MCV 91.9 93.3 90.3  PLT 215 178 097   Basic Metabolic Panel: Recent Labs  Lab 07/06/18 1144 07/07/18 0514 07/08/18 0622 07/09/18 0407  NA 138 139 139 139  K 5.1 4.6 4.5 4.6  CL 107 110 108 105  CO2 20* 20* 22 23  GLUCOSE 219* 132* 138* 141*  BUN 31* 27* 22 22  CREATININE 1.31* 1.20* 1.05* 0.97  CALCIUM 9.6 9.0 8.9 9.5  MG  --   --  1.9  --    GFR: Estimated Creatinine Clearance: 71.9 mL/min (by C-G formula based on SCr of 0.97 mg/dL). Liver Function Tests: Recent Labs  Lab 07/07/18 0514  AST 30  ALT 47*  ALKPHOS 60  BILITOT 0.7  PROT 6.0*  ALBUMIN 3.3*   No results for input(s): LIPASE, AMYLASE in the last 168 hours. No results for input(s): AMMONIA in the last 168 hours. Coagulation Profile: No results for input(s): INR, PROTIME in the last 168 hours. Cardiac Enzymes: Recent Labs  Lab 07/06/18 1144  TROPONINI <0.03   BNP (last 3 results) No results for input(s): PROBNP in the last 8760 hours. HbA1C: Recent Labs    07/06/18 1957  HGBA1C 7.9*   CBG: Recent Labs  Lab 07/08/18 1129 07/08/18 1609 07/08/18 2013 07/08/18 2202 07/09/18 0647  GLUCAP 174* 175* 235* 206* 162*   Lipid Profile: Recent Labs    07/07/18 0514  CHOL 221*  HDL 42  LDLCALC 126*  TRIG 266*  CHOLHDL 5.3   Thyroid Function Tests: No results for input(s): TSH, T4TOTAL, FREET4, T3FREE, THYROIDAB in the last 72 hours. Anemia Panel: No results for input(s): VITAMINB12, FOLATE, FERRITIN, TIBC, IRON, RETICCTPCT in the last 72 hours. Urine analysis:    Component Value Date/Time   COLORURINE YELLOW 07/06/2018 1432   APPEARANCEUR CLEAR 07/06/2018 1432   LABSPEC 1.015 07/06/2018 1432   PHURINE 5.0 07/06/2018 1432   GLUCOSEU >=500 (A) 07/06/2018 1432   HGBUR NEGATIVE 07/06/2018 1432   BILIRUBINUR NEGATIVE 07/06/2018 1432   KETONESUR NEGATIVE 07/06/2018 1432   PROTEINUR 30 (A) 07/06/2018 1432   NITRITE NEGATIVE  07/06/2018 1432   LEUKOCYTESUR NEGATIVE 07/06/2018 1432     Mariko Nowakowski M.D. [facility] 07/09/2018, 9:06 AM  Pager: [phone] Between 7am to 7pm - call Pager - [phone]  After 7pm go to [email or web address] - password [email or web address]  Call night coverage person covering after 7pm

## 2018-07-09 NOTE — Progress Notes (Signed)
Paged Lamar Blinks, NP notifying of CBG of [phone]. Awaiting orders

## 2018-07-09 NOTE — Progress Notes (Addendum)
Documented on the wrong pt.

## 2018-07-09 NOTE — Transfer of Care (Signed)
Immediate Anesthesia Transfer of Care Note  Patient: Jocelyn Kaufman  Procedure(s) Performed: OPEN REDUCTION INTERNAL FIXATION (ORIF) HUMERAL SHAFT FRACTURE (Right )  Patient Location: PACU  Anesthesia Type:General  Level of Consciousness: awake, alert  and oriented  Airway & Oxygen Therapy: Patient Spontanous Breathing and Patient connected to face mask oxygen  Post-op Assessment: Report given to RN and Post -op Vital signs reviewed and stable  Post vital signs: Reviewed and stable  Last Vitals:  Vitals Value Taken Time  BP 149/77 07/09/2018 12:44 PM  Temp    Pulse 83 07/09/2018 12:46 PM  Resp 19 07/09/2018 12:46 PM  SpO2 98 % 07/09/2018 12:46 PM  Vitals shown include unvalidated device data.  Last Pain:  Vitals:   07/09/18 0840  TempSrc:   PainSc: 8       Patients Stated Pain Goal: 3 (57/50/51 8335)  Complications: No apparent anesthesia complications

## 2018-07-09 NOTE — Op Note (Signed)
Orthopaedic Surgery Operative Note (CSN: [id number])  Jocelyn Kaufman  [birth year] Date of Surgery: 07/06/2018 - 07/09/2018   Diagnoses:  R humeral shaft fracture with significant displacement  Procedure: ORIF right humerus shaft fracture   Operative Finding Successful completion of planned procedure.  We had great fixation with to lag screws and a neutralization plate.  Preoperative x-rays and intraoperative x-rays were reviewed and on one view there is a line that is unclear whether it is a nutrient vessel or in a nondisplaced fracture line extending from proximal to distal direction lateral to medial separate from the obvious butterfly fragment.  We took care to identify this and shoot our screws proximally into the proximal fragment itself attached to the head rather than fracture line.  At the end of the case humerus moved as a unit we had great fixation with 4 locking screws above the fractures.  We decided not to use a humeral head plate as we did not want to violate the had any more than we needed to we had good fixation we felt.  4 nonlocking screws were used distally  Post-operative plan: The patient will be nonweightbearing with range of motion as tolerated starting at 2 weeks due to potential for nondisplaced proximal extension.  The patient will be readmitted to the floor.  DVT prophylaxis per medical team.  Pain control with PRN pain medication preferring oral medicines.  Follow up plan will be scheduled in approximately 7 days for incision check and XR.  Post-Op Diagnosis: Same Surgeons:Primary: Hiram Gash, MD Assistants: Joya Gaskins OPAC Location: [facility] OR ROOM 03 Anesthesia: General Antibiotics: Ancef 2g preop, Vancomycin 1000mg  locally , tobramycin 1.25 g locally Tourniquet time: * No tourniquets in log * Estimated Blood Loss: 338 Complications: None Specimens: None Implants: Implant Name Type Inv. Item Serial No. Manufacturer Lot No. LRB No. Used Action  SCREW CORTICAL 3.5MM  24MM - [id number] Screw SCREW CORTICAL 3.5MM 24MM  [employer](ORTH,TRAU,BIO,SG)  Right 2 Implanted  NCB STRAIGHT 14 HOLE PLATE     [employer]ET ORTHO [employer]  Right 1 Implanted  SCREW NCB 4.0 28MM - [id number] Screw SCREW NCB 4.0 28MM  [employer](ORTH,TRAU,BIO,SG)  Right 2 Implanted  SCREW NCB 4.0MX38M - [id number] Screw SCREW NCB 4.0MX38M  ZIMMER [employer]ON(ORTH,TRAU,BIO,SG)  Right 1 Implanted  SCREW NCB 4.0X26MM - [id number] Screw SCREW NCB 4.0X26MM  [employer]MER [employer]ON(ORTH,TRAU,BIO,SG)  Right 3 Implanted  CAP LOCK NCB - [id number] Cap CAP LOCK NCB  Z[employer]MER [employer]ON(ORTH,TRAU,BIO,SG)  Right 5 Implanted    Indications for Surgery:   Jocelyn Kaufman is a 62 y.o. female with significantly displaced spiral oblique distracted fracture of the humeral shaft.  Patient had significant central obesity and we are worried about her ability to mobilize due to pain as well as her varus angulation and the chance of nonunion.  We discussed at length that the patient was a relative indication for open reduction internal fixation and after discussion with the risk of nonunion in the nonoperative setting the patient understood that her risks were improved with operative management and elected to proceed.  Benefits and risks of operative and nonoperative management were discussed prior to surgery with patient/guardian(s) and informed consent form was completed.  Specific risks including infection, need for additional surgery, radial nerve injury, periprosthetic fracture, loss of fixation, wound healing concerns.   Procedure:   The patient was identified in the preoperative holding area where the surgical site was marked. The patient was taken to the OR where a procedural timeout  was called and the above noted anesthesia was induced.  The patient was positioned prone on a regular bed with a radiolucent table.  Preoperative antibiotics were dosed.  The patient's right humerus was prepped and draped in the usual sterile fashion.  A second  preoperative timeout was called.      We began with an anterior incision overlying the humerus.  We used x-ray to guide our incision length and placement.  We dissected through skin sharply and used a Bovie to achieve hemostasis as we progressed.  We identified the bicipital fascia and open this and retracted the biceps medially to protect the medial neurovascular structures.  This point we noted that the fracture itself had significant periosteal stripping and actually caused a rent longitudinally along the brachialis which we would have otherwise made.  We extended this exposure through the brachialis splitting it in its midportion along its fibers.  We identified the proximal aspect of the fracture that actually extended to just about the insertion of the deltoid.  On fluoroscopy views including live views there was one line that could either be a nutrient vessel seen on the lateral views versus a nondisplaced fracture fragment and we took care to ensure that this was addressed by cheating fixation into the proximal fragment itself rather than into this potential butterfly fragment.  This point the fracture site was cleared of hematoma using a rondure and a curette.  We then were able to use point-to-point reduction clamps to achieve a anatomic reduction of the spiral oblique fracture.  We took care not to use retractors that were sharp or went around the humerus to avoid irritation of the radial nerve near the neurovascular structures.  This point we placed 2 lag screws by technique held the fracture anatomically reduced.  We then were able to place a neutralization hybrid large frag plate with 4.0 screws 14 holes long.  This Biomet NCB plate allowed multiple points of fixation while still allowing multidirectional locking and a large fragment thickness of the plate.  We used fluoroscopy to identify where the plate should be located making sure that we had proximal fixation into the calcar as well as  distally past the fracture.  We used locking screws proximal to the fracture site in the setting of potential for comminution and a long oblique fracture.  We used fluoroscopy to ensure that our proximalmost calcar screw was not within the joint.  Proximal screws were all locked with locking caps in the distal 4 screws were all left non-locking bicortical.  These all had great fixation.  The end of the case the humerus his unit were very happy with her reduction which is anatomic in her fixation.    The incision was thoroughly irrigated before local antibiotics placed within the incision and local anesthetic was infiltrated into the skin.  Incision was closed in a multilayer fashion with absorbable sutures. A sterile dressing was placed.  The patient was awoken from general anesthesia and taken to the PACU in stable condition without complication.   Joya Gaskins, OPA-C, present and scrubbed throughout the case, critical for completion in a timely fashion, and for retraction, instrumentation, closure.

## 2018-07-09 NOTE — Anesthesia Procedure Notes (Signed)
Procedure Name: Intubation Date/Time: 07/09/2018 10:24 AM Performed by: Gwyndolyn Saxon, CRNA Pre-anesthesia Checklist: Patient identified, Emergency Drugs available, Suction available and Patient being monitored Patient Re-evaluated:Patient Re-evaluated prior to induction Oxygen Delivery Method: Circle system utilized Preoxygenation: Pre-oxygenation with 100% oxygen Induction Type: IV induction Ventilation: Mask ventilation without difficulty Laryngoscope Size: Miller and 2 Grade View: Grade I Tube type: Oral Tube size: 7.0 mm Number of attempts: 1 Airway Equipment and Method: Patient positioned with wedge pillow Placement Confirmation: ETT inserted through vocal cords under direct vision,  positive ETCO2,  CO2 detector and breath sounds checked- equal and bilateral Secured at: 20 cm Tube secured with: Tape Dental Injury: Teeth and Oropharynx as per pre-operative assessment

## 2018-07-09 NOTE — Progress Notes (Signed)
Pt was upset this morning because DR Griffin Basil did not come up to the unit to educate her on her procedure. OR called top pick patient up for procedure and pat will no talk to RN or comply with Pre op procedure. Nurse notified short stay RN during hand over report

## 2018-07-09 NOTE — Progress Notes (Signed)
ORTHOPAEDIC PROGRESS NOTE  s/p Procedure(s): OPEN REDUCTION INTERNAL FIXATION (ORIF) HUMERAL SHAFT FRACTURE  SUBJECTIVE: Patient seen in pacu, doing well, minimal pain  OBJECTIVE: PE: +AIN/PIN/Ulnar motor function, intact sensation, dressing reinforced, wwp hand.  No pain with gentle IR/ER of shoulder  Vitals:   07/09/18 1414 07/09/18 1423  BP: 139/72   Pulse: 82 80  Resp: 10 19  Temp:    SpO2: 98% 99%    XR demonstrate near anatomic alignment of fracture, good fixation, no abnormalities.   ASSESSMENT: Jocelyn Kaufman is a 62 y.o. female doing well postoperatively.  PLAN: Weightbearing: NWB  In sling for 2 weeks in light of poor bone quality and comminuted fracture.  Will start ROM as tolerated likely then. Insicional and dressing care: Reinforced aquacel, likely leave in place 7 days Orthopedic device(s): none Showering: prn, keep incision dry VTE prophylaxis: per primary  Pain control: prn meds, patient has nausea with oxycodone so minimize but have non-narcotic adjuvants as well. Follow - up plan: 1 week Contact information:  Weekdays 8-5 Ophelia Charter MD [phone], After hours and holidays please check [email or web address] for group call information for [facility]

## 2018-07-10 DIAGNOSIS — E0800 Diabetes mellitus due to underlying condition with hyperosmolarity without nonketotic hyperglycemic-hyperosmolar coma (NKHHC): Secondary | ICD-10-CM

## 2018-07-10 DIAGNOSIS — Z794 Long term (current) use of insulin: Secondary | ICD-10-CM

## 2018-07-10 LAB — CBC
HCT: 29.1 % — ABNORMAL LOW (ref 36.0–46.0)
Hemoglobin: 8.8 g/dL — ABNORMAL LOW (ref 12.0–15.0)
MCH: 27.2 pg (ref 26.0–34.0)
MCHC: 30.2 g/dL (ref 30.0–36.0)
MCV: 89.8 fL (ref 78.0–100.0)
Platelets: 193 10*3/uL (ref 150–400)
RBC: 3.24 MIL/uL — ABNORMAL LOW (ref 3.87–5.11)
RDW: 14.2 % (ref 11.5–15.5)
WBC: 10.1 10*3/uL (ref 4.0–10.5)

## 2018-07-10 LAB — GLUCOSE, CAPILLARY
Glucose-Capillary: 251 mg/dL — ABNORMAL HIGH (ref 70–99)
Glucose-Capillary: 181 mg/dL — ABNORMAL HIGH (ref 70–99)
Glucose-Capillary: 179 mg/dL — ABNORMAL HIGH (ref 70–99)
Glucose-Capillary: 184 mg/dL — ABNORMAL HIGH (ref 70–99)

## 2018-07-10 LAB — BASIC METABOLIC PANEL
Anion gap: 10 (ref 5–15)
BUN: 29 mg/dL — ABNORMAL HIGH (ref 8–23)
CO2: 22 mmol/L (ref 22–32)
Calcium: 8.9 mg/dL (ref 8.9–10.3)
Chloride: 101 mmol/L (ref 98–111)
Creatinine, Ser: 1.35 mg/dL — ABNORMAL HIGH (ref 0.44–1.00)
GFR calc Af Amer: 48 mL/min — ABNORMAL LOW (ref >60)
GFR calc non Af Amer: 41 mL/min — ABNORMAL LOW (ref >60)
Glucose, Bld: 228 mg/dL — ABNORMAL HIGH (ref 70–99)
Potassium: 5.3 mmol/L — ABNORMAL HIGH (ref 3.5–5.1)
Sodium: 133 mmol/L — ABNORMAL LOW (ref 135–145)

## 2018-07-10 MED ORDER — INSULIN ASPART 100 UNIT/ML ~~LOC~~ SOLN
3.0000 [IU] | Freq: Three times a day (TID) | SUBCUTANEOUS | Status: DC
  Administered 2018-07-10 – 2018-07-12 (×8): 3 [IU] via SUBCUTANEOUS

## 2018-07-10 MED ORDER — MAGNESIUM CITRATE PO SOLN
1.0000 | Freq: Once | ORAL | Status: AC
  Administered 2018-07-11: 1 via ORAL
  Filled 2018-07-10: qty 296

## 2018-07-10 NOTE — Anesthesia Postprocedure Evaluation (Signed)
Anesthesia Post Note  Patient: Jocelyn Kaufman  Procedure(s) Performed: OPEN REDUCTION INTERNAL FIXATION (ORIF) HUMERAL SHAFT FRACTURE (Right )     Patient location during evaluation: PACU Anesthesia Type: General Level of consciousness: awake and alert Pain management: pain level controlled Vital Signs Assessment: post-procedure vital signs reviewed and stable Respiratory status: spontaneous breathing, nonlabored ventilation, respiratory function stable and patient connected to nasal cannula oxygen Cardiovascular status: blood pressure returned to baseline and stable Postop Assessment: no apparent nausea or vomiting Anesthetic complications: no    Last Vitals:  Vitals:   07/10/18 0847 07/10/18 1204  BP: 131/68 (!) 120/59  Pulse: 72 73  Resp: 16 20  Temp: 36.9 C 36.7 C  SpO2: 98% 98%    Last Pain:  Vitals:   07/10/18 1455  TempSrc:   PainSc: 7                  Effie Berkshire

## 2018-07-10 NOTE — NC FL2 (Signed)
[place] LEVEL OF CARE SCREENING TOOL     IDENTIFICATION  Patient Name: Jocelyn Kaufman Birthdate: 06/03/1956 Sex: female Admission Date (Current Location): 07/06/2018  [facility] and [place] Number:  Herbalist and Address:  [facility] [place], [place] [address] [place], [place] [address]      Provider Number: [id number]  Attending Physician Name and Address:  Mendel Corning, MD  Relative Name and Phone Number:       Current Level of Care: Hospital Recommended Level of Care: [place] Prior Approval Number:    Date Approved/Denied:   PASRR Number: Pending  Discharge Plan: SNF    Current Diagnoses: Patient Active Problem List   Diagnosis Date Noted  . Fall at home, initial encounter 07/06/2018  . Displaced spiral fracture of shaft of humerus, right arm, initial encounter for closed fracture 07/06/2018  . TIA (transient ischemic attack) 07/06/2018  . Allergic rhinitis 07/04/2018  . Normochromic normocytic anemia 01/20/2018  . Rheumatoid arthritis ([place]) 04/30/2017  . Lesion of left humerus 04/29/2017  . Pericardial effusion 03/14/2017  . Diabetes mellitus ([place]) 03/14/2017  . Essential hypertension 03/14/2017  . Mixed hyperlipidemia 03/14/2017  . 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 Meralgia paresthetica of right side 01/10/2017  . Allergic rhinoconjunctivitis 07/09/2015  . Asthma, well controlled 07/09/2015  . Laryngopharyngeal reflux (LPR) 07/09/2015    Orientation RESPIRATION BLADDER Height & Weight     Self, Time, Place, Situation  Normal Continent Weight: 236 lb 5.3 oz (107.2 kg) Height:  5' 4.02" (162.6 cm)  BEHAVIORAL SYMPTOMS/MOOD NEUROLOGICAL BOWEL NUTRITION STATUS      Continent Diet(carb modified)  AMBULATORY STATUS COMMUNICATION OF NEEDS Skin   Limited Assist Verbally Surgical wounds(right arm, closed incision; abdominal pad dressing)                       Personal Care Assistance  Level of Assistance  Bathing, Feeding, Dressing Bathing Assistance: Limited assistance Feeding assistance: Limited assistance Dressing Assistance: Limited assistance     Functional Limitations Info  Sight, Hearing, Speech Sight Info: Adequate Hearing Info: Adequate Speech Info: Adequate    SPECIAL CARE FACTORS FREQUENCY  PT (By licensed PT), OT (By licensed OT)     PT Frequency: 5x/wk OT Frequency: 5x/wk            Contractures Contractures Info: Not present    Additional Factors Info  Code Status, Allergies, Psychotropic, Insulin Sliding Scale Code Status Info: Full Allergies Info: Invokana Canagliflozin, Percocet Oxycodone-acetaminophen, Prilosec Omeprazole, Rogaine Minoxidil, Victoza Liraglutide Psychotropic Info: Lexapro 20mg  daily Insulin Sliding Scale Info: 0-15 units 3x/day with meals; 3 units 3x/day with meals; Lantus 60 units daily at bed       Current Medications (07/10/2018):  This is the current hospital active medication list Current Facility-Administered Medications  Medication Dose Route Frequency Provider Last Rate Last Dose  . 0.9 %  sodium chloride infusion   Intravenous Continuous Cristal Deer, MD      . acetaminophen (TYLENOL) tablet 1,000 mg  1,000 mg Oral Q8H Ophelia Charter T, MD   1,000 mg at 07/10/18 0659  . albuterol (PROVENTIL) (2.5 MG/3ML) 0.083% nebulizer solution 3 mL  3 mL Inhalation Q4H PRN Cristal Deer, MD      . amLODipine (NORVASC) tablet 5 mg  5 mg Oral Daily Purohit, Shrey C, MD   5 mg at 07/10/18 0946  . aspirin suppository 300 mg  300 mg Rectal Daily  Cristal Deer, MD       Or  . aspirin tablet 325 mg  325 mg Oral Daily Cristal Deer, MD   325 mg at 07/10/18 0945  . aspirin-acetaminophen-caffeine (EXCEDRIN MIGRAINE) per tablet 2 tablet  2 tablet Oral Q6H PRN Cristal Deer, MD   2 tablet at 07/06/18 2321  . atorvastatin (LIPITOR) tablet 40 mg  40 mg Oral q1800 Purohit, Shrey C, MD   40 mg at 07/09/18 1720  . docusate  sodium (COLACE) capsule 100 mg  100 mg Oral BID Ophelia Charter T, MD   100 mg at 07/10/18 0946  . enoxaparin (LOVENOX) injection 40 mg  40 mg Subcutaneous Q24H Cristal Deer, MD   40 mg at 07/09/18 2247  . escitalopram (LEXAPRO) tablet 20 mg  20 mg Oral Daily Cristal Deer, MD   20 mg at 07/10/18 0946  . ferrous sulfate tablet 325 mg  325 mg Oral Daily Cristal Deer, MD   325 mg at 07/10/18 0945  . folic acid (FOLVITE) tablet 2 mg  2 mg Oral Daily Cristal Deer, MD   2 mg at 07/10/18 0946  . hydrALAZINE (APRESOLINE) tablet 25 mg  25 mg Oral BID Purohit, Shrey C, MD   25 mg at 07/10/18 0945  . irbesartan (AVAPRO) tablet 300 mg  300 mg Oral Daily Purohit, Shrey C, MD   300 mg at 07/10/18 0945   And  . hydrochlorothiazide (MICROZIDE) capsule 12.5 mg  12.5 mg Oral Daily Purohit, Shrey C, MD   12.5 mg at 07/10/18 0946  . HYDROmorphone (DILAUDID) injection 0.5-1 mg  0.5-1 mg Intravenous Q4H PRN Hiram Gash, MD   1 mg at 07/10/18 0819  . ibuprofen (ADVIL,MOTRIN) tablet 200 mg  200 mg Oral Q6H PRN Cristal Deer, MD   200 mg at 07/08/18 1244  . insulin aspart (novoLOG) injection 0-15 Units  0-15 Units Subcutaneous TID WC Cristal Deer, MD   3 Units at 07/09/18 1721  . insulin aspart (novoLOG) injection 3 Units  3 Units Subcutaneous TID WC Rai, Ripudeep K, MD      . insulin glargine (LANTUS) injection 60 Units  60 Units Subcutaneous QHS Cristal Deer, MD   60 Units at 07/09/18 2255  . latanoprost (XALATAN) 0.005 % ophthalmic solution 1 drop  1 drop Both Eyes Daily Cristal Deer, MD   1 drop at 07/09/18 2257  . levothyroxine (SYNTHROID, LEVOTHROID) tablet 125 mcg  125 mcg Oral QAC breakfast Cristal Deer, MD   125 mcg at 07/10/18 0820  . loratadine (CLARITIN) tablet 10 mg  10 mg Oral Daily Cristal Deer, MD   10 mg at 07/10/18 0946  . LORazepam (ATIVAN) tablet 0.5 mg  0.5 mg Oral PRN Cristal Deer, MD   0.5 mg at 07/07/18 2130  . magnesium citrate solution 1 Bottle  1 Bottle  Oral Once Rai, Ripudeep K, MD      . magnesium gluconate (MAGONATE) tablet 500 mg  500 mg Oral Daily Cristal Deer, MD   500 mg at 07/10/18 0946  . metaxalone (SKELAXIN) tablet 400 mg  400 mg Oral Daily PRN Cristal Deer, MD   400 mg at 07/10/18 0402  . metoCLOPramide (REGLAN) tablet 5-10 mg  5-10 mg Oral Q8H PRN Hiram Gash, MD       Or  . metoCLOPramide (REGLAN) injection 5-10 mg  5-10 mg Intravenous Q8H PRN Hiram Gash, MD      . multivitamin with minerals tablet 1 tablet  1 tablet Oral Daily  Cristal Deer, MD   1 tablet at 07/10/18 0945  . ondansetron (ZOFRAN) injection 4 mg  4 mg Intravenous Q6H PRN Cristal Deer, MD   4 mg at 07/06/18 2027  . oxyCODONE (Oxy IR/ROXICODONE) immediate release tablet 5-10 mg  5-10 mg Oral Q4H PRN Hiram Gash, MD   10 mg at 07/10/18 0356  . senna-docusate (Senokot-S) tablet 1 tablet  1 tablet Oral QHS PRN Cristal Deer, MD      . vitamin B-12 (CYANOCOBALAMIN) tablet 100 mcg  100 mcg Oral Daily Cristal Deer, MD   100 mcg at 07/10/18 0946  . Vitamin D (Ergocalciferol) (DRISDOL) capsule 50,000 Units  50,000 Units Oral Q Mon Purohit, Konrad Dolores, MD   50,000 Units at 07/07/18 1224     Discharge Medications: Please see discharge summary for a list of discharge medications.  Relevant Imaging Results:  Relevant Lab Results:   Additional Information SS#: [id number]  Geralynn Ochs, LCSW

## 2018-07-10 NOTE — Progress Notes (Signed)
ORTHOPAEDIC PROGRESS NOTE  s/p Procedure(s): OPEN REDUCTION INTERNAL FIXATION (ORIF) HUMERAL SHAFT FRACTURE  SUBJECTIVE: She did not remember being seen in PACU but talked at length about her surgery today again.  Blood glucose is high and counseled her on the importance of managing this both inpatient and outpatient.  OBJECTIVE: PE: +AIN/PIN/Ulnar motor function, intact sensation, dressing with mild strikethrough but did not need to be replaced today, wwp hand.  No pain with gentle IR/ER of shoulder  Vitals:   07/10/18 0344 07/10/18 0847  BP: (!) 164/76 131/68  Pulse: 78 72  Resp: 13 16  Temp: 98.9 F (37.2 C) 98.5 F (36.9 C)  SpO2: 98% 98%    XR demonstrate near anatomic alignment of fracture, good fixation, no abnormalities.   ASSESSMENT: Jocelyn Kaufman is a 62 y.o. female doing well postoperatively.  PLAN: Weightbearing: NWB  In sling for 2 weeks in light of poor bone quality and comminuted fracture.  Will start ROM as tolerated likely then. Insicional and dressing care: Reinforced aquacel, likely leave in place 7 days Orthopedic device(s): none Showering: prn, keep incision dry VTE prophylaxis: per primary  Pain control: prn meds, patient has nausea with oxycodone so minimize but have non-narcotic adjuvants as well. Follow - up plan: 1 week Contact information:  Weekdays 8-5 Ophelia Charter MD [phone], After hours and holidays please check [email or web address] for group call information for [facility]

## 2018-07-10 NOTE — Progress Notes (Signed)
[facility]                                                                              Patient Demographics  Jocelyn Kaufman, is a 62 y.o. female, DOB - 09-Dec-1955, WYO:[id number]  Admit date - 07/06/2018   Admitting Physician Cristal Deer, MD  Outpatient Primary MD for the patient is Deland Pretty, MD  Outpatient specialists:   LOS - 4  days   Medical records reviewed and are as summarized below:    Chief Complaint  Patient presents with  . Fall  . Dizziness  . Arm Pain  . Head Injury       Brief summary   62 year old with past medical history relevant for type 2 diabetes on insulin, anxiety/depression, hypothyroidism, possible adrenal insufficiency, headache disorder, hypertension who came in with vertigo and fall concerning for TIA complicated by humeral fracture on right arm.   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 -LDL 126, continue Lipitor 40 mg daily -Hemoglobin A1c 7.9, continue aspirin 325 mg daily -Neurology consulted. -PT OT consult recommended skilled nursing facility for rehab  Essential hypertension -BP currently stable, continue amlodipine, HCTZ, irbesartan, hydralazine   Type 2 diabetes mellitus, insulin-dependent, uncontrolled with hyperglycemia -Hemoglobin A1c 7.9 -For now hold Trulicity, metformin -CBGs overnight and this morning elevated, per patient had a late dinner brought by family -Continue  Lantus, placed on NovoLog meal coverage 3 units 3 times daily before meals with sliding scale insulin   Hypothyroidism -Continue Synthroid  Depression -Stable, no SI/HI, continue Lexapro 20 mg daily  Constipation -States has not had BM in the last 3 days, mag citrate x1, continue Senokot as   Code Status: Full CODE STATUS DVT Prophylaxis:  Lovenox  Family Communication: Discussed in detail with the patient, all imaging results, lab results explained to the patient    Disposition Plan: Skilled nursing facility  Time Spent in minutes 25  Procedures:  MRI/MRA, 2D echo, carotid Dopplers ORIF right humeral shaft fracture 9/11  Consultants:   Neurology Orthopedics  Antimicrobials:   None   Medications  Scheduled Meds: . acetaminophen  1,000 mg Oral Q8H  . amLODipine  5 mg Oral Daily  . aspirin  300 mg Rectal Daily   Or  . aspirin  325 mg Oral Daily  . atorvastatin  40 mg Oral q1800  . docusate sodium  100 mg Oral BID  . enoxaparin (LOVENOX) injection  40 mg Subcutaneous Q24H  . escitalopram  20 mg Oral Daily  . ferrous sulfate  325 mg Oral Daily  . folic acid  2 mg Oral Daily  . hydrALAZINE  25 mg Oral BID  .  irbesartan  300 mg Oral Daily   And  . hydrochlorothiazide  12.5 mg Oral Daily  . insulin aspart  0-15 Units Subcutaneous TID WC  . insulin aspart  3 Units Subcutaneous TID WC  . insulin glargine  60 Units Subcutaneous QHS  . latanoprost  1 drop Both Eyes Daily  . levothyroxine  125 mcg Oral QAC breakfast  . loratadine  10 mg Oral Daily  . magnesium citrate  1 Bottle Oral Once  . magnesium gluconate  500 mg Oral Daily  . multivitamin with minerals  1 tablet Oral Daily  . vitamin B-12  100 mcg Oral Daily  . Vitamin D (Ergocalciferol)  50,000 Units Oral Q Mon   Continuous Infusions: . sodium chloride     PRN Meds:.albuterol, aspirin-acetaminophen-caffeine, HYDROmorphone (DILAUDID) injection, ibuprofen, LORazepam, metaxalone, metoCLOPramide **OR**  metoCLOPramide (REGLAN) injection, ondansetron, oxyCODONE, senna-docusate   Antibiotics   Anti-infectives (From admission, onward)   Start     Dose/Rate Route Frequency Ordered Stop   07/09/18 1630  ceFAZolin (ANCEF) IVPB 1 g/50 mL premix     1 g 100 mL/hr over 30 Minutes Intravenous Every 6 hours 07/09/18 1518 07/10/18 0432   07/09/18 1157  tobramycin (NEBCIN) powder  Status:  Discontinued       As needed 07/09/18 1157 07/09/18 1240   07/09/18 1156  vancomycin (VANCOCIN) powder  Status:  Discontinued       As needed 07/09/18 1156 07/09/18 1240   07/09/18 0730  ceFAZolin (ANCEF) IVPB 2g/100 mL premix     2 g 200 mL/hr over 30 Minutes Intravenous To Short Stay 07/09/18 0707 07/09/18 1053        Subjective:   Jocelyn Kaufman was seen and examined today.  Complaining of pain in the right arm, 5/10, also feels constipated.  No vomiting, abdominal pain, chest pain or shortness of breath.    Objective:   Vitals:   07/09/18 2019 07/10/18 0055 07/10/18 0344 07/10/18 0847  BP: (!) 153/73 (!) 145/71 (!) 164/76 131/68  Pulse: 83 95 78 72  Resp: 18 18 13 16   Temp: 98 F (36.7 C) 99 F (37.2 C) 98.9 F (37.2 C) 98.5 F (36.9 C)  TempSrc: Oral Oral Oral   SpO2: 97% 97% 98% 98%  Weight:      Height:        Intake/Output Summary (Last 24 hours) at 07/10/2018 1150 Last data filed at 07/10/2018 0432 [place] per 24 hour  Intake 1227.51 ml  Output 25 ml  Net 1202.51 ml     Wt Readings from Last 3 Encounters:  07/09/18 107.2 kg  07/04/18 104.4 kg  06/27/18 105.9 kg     Exam  General: Alert and oriented x 3, NAD Eyes:  HEENT:  Atraumatic, normocephalic Cardiovascular: S1 S2 auscultated,  Regular rate and rhythm. No pedal edema b/l Respiratory: Clear to auscultation bilaterally, no wheezing, rales or rhonchi Gastrointestinal: Soft, nontender, nondistended, + bowel sounds Ext: no pedal edema bilaterally Neuro: No new deficits Musculoskeletal: Right arm in sling Skin: No  rashes Psych: Normal affect and demeanor, alert and oriented x3   Data Reviewed:  I have personally reviewed following labs and imaging studies  Micro Results Recent Results (from the past 240 hour(s))  MRSA PCR Screening     Status: None   Collection Time: 07/09/18  4:07 AM  Result Value Ref Range Status   MRSA by PCR NEGATIVE NEGATIVE Final    Comment:        The GeneXpert MRSA  Assay (FDA approved for NASAL specimens only), is one component of a comprehensive MRSA colonization surveillance program. It is not intended to diagnose MRSA infection nor to guide or monitor treatment for MRSA infections. Performed at [facility], [place] [address], [place], [place] [address]     Radiology Reports Dg Chest 2 View  Result Date: 07/06/2018 CLINICAL DATA:  Acute RIGHT chest pain following fall. Initial encounter. EXAM: CHEST - 2 VIEW COMPARISON:  04/05/2017 and prior radiographs FINDINGS: Mild cardiomegaly again noted. There is no evidence of focal airspace disease, pulmonary edema, suspicious pulmonary nodule/mass, pleural effusion, or pneumothorax. No acute bony abnormalities are identified. IMPRESSION: Mild cardiomegaly without evidence of acute cardiopulmonary disease. Electronically Signed   By: Margarette Canada M.D.   On: 07/06/2018 21:34   Dg Shoulder Right  Result Date: 07/06/2018 CLINICAL DATA:  Dizzy.  Fall.  Right arm and shoulder pain. EXAM: RIGHT SHOULDER - 2+ VIEW COMPARISON:  None. FINDINGS: An oblique midshaft right humerus fracture demonstrates at least 15 degrees angulation. Fracture demonstrates displacement of 1/2 the shaft width. Degenerative changes present at the shoulder without additional fractures. IMPRESSION: 1. Oblique fracture of the midshaft right humerus with moderate displacement and angulation. Electronically Signed   By: San Morelle M.D.   On: 07/06/2018 13:49   Ct Head Wo Contrast  Result Date: 07/06/2018 CLINICAL DATA:  Fall.  Dizziness. EXAM: CT  HEAD WITHOUT CONTRAST TECHNIQUE: Contiguous axial images were obtained from the base of the skull through the vertex without intravenous contrast. COMPARISON:  None. FINDINGS: Brain: No evidence of acute infarction, hemorrhage, hydrocephalus, extra-axial collection or mass lesion/mass effect. Vascular: Calcified atherosclerosis in the intracranial carotids. Skull: Normal. Negative for fracture or focal lesion. Sinuses/Orbits: No acute finding. Other: None. IMPRESSION: No acute intracranial abnormalities. Electronically Signed   By: Dorise Bullion III M.D   On: 07/06/2018 13:28   Mr Brain Wo Contrast  Result Date: 07/06/2018 CLINICAL DATA:  Dizziness EXAM: MRI HEAD WITHOUT CONTRAST MRA HEAD WITHOUT CONTRAST TECHNIQUE: Multiplanar, multiecho pulse sequences of the brain and surrounding structures were obtained without intravenous contrast. Angiographic images of the head were obtained using MRA technique without contrast. COMPARISON:  Head CT 07/06/2018 FINDINGS: MRI HEAD FINDINGS BRAIN: There is no acute infarct, acute hemorrhage or mass effect. Partially empty sella. There are no old infarcts. The white matter signal is normal for the patient's age. The CSF spaces are normal for age, with no hydrocephalus. Susceptibility-sensitive sequences show no chronic microhemorrhage or superficial siderosis. SKULL AND UPPER CERVICAL SPINE: The visualized skull base, calvarium, upper cervical spine and extracranial soft tissues are normal. SINUSES/ORBITS: No fluid levels or advanced mucosal thickening. No mastoid or middle ear effusion. The orbits are normal. MRA HEAD FINDINGS Intracranial internal carotid arteries: Moderate-to-severe stenosis of the cavernous segment of the right ICA. Anterior cerebral arteries: Normal. Middle cerebral arteries: Normal. Posterior communicating arteries: Present bilaterally. Posterior cerebral arteries: Normal. Basilar artery: Normal. Vertebral arteries: Left dominant. Normal. Superior  cerebellar arteries: Normal. Inferior cerebellar arteries: Normal. IMPRESSION: 1. No acute intracranial abnormality. Normal appearance of the brain for age. 2. 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 Otherwise normal intracranial MRA. Electronically Signed   By: Ulyses Jarred M.D.   On: 07/06/2018 22:26   Dg Humerus Right  Result Date: 07/09/2018 CLINICAL DATA:  Status post right humeral ORIF. EXAM: RIGHT HUMERUS - 2+ VIEW COMPARISON:  July 09, 2018 intraoperative film. FINDINGS: Status post fixation of the right humerus with orthopedic side  plate fixated by horizontal screws without malalignment. IMPRESSION: Status post fixation of the right humerus with orthopedic side plate fixated by horizontal screws without malalignment. Electronically Signed   By: Abelardo Diesel M.D.   On: 07/09/2018 15:16   Dg Humerus Right  Result Date: 07/09/2018 CLINICAL DATA:  Status post ORIF of the right humerus EXAM: RIGHT HUMERUS - 2+ VIEW; DG C-ARM 61-120 MIN COMPARISON:  Preoperative radiograph of the right humerus of July 06, 2018 FINDINGS: Fluoro time reported is 1 minutes, 12 seconds. Six fluoro spot images are reviewed. The patient has undergone plate and screw ORIF for a spiral midshaft humeral fracture. Alignment is now near anatomic. IMPRESSION: Status post ORIF for an angulated midshaft spiral fracture of the right humerus. Electronically Signed   By: David  Martinique M.D.   On: 07/09/2018 12:23   Dg Humerus Right  Result Date: 07/06/2018 CLINICAL DATA:  Fall.  Right upper arm pain.  Initial encounter. EXAM: RIGHT HUMERUS - 2+ VIEW COMPARISON:  None. FINDINGS: A spiral fracture of the humeral diaphysis is seen with medial displacement and angulation of the distal fracture fragment. Generalized osteopenia noted. IMPRESSION: Displaced spiral fracture of the humeral diaphysis. Electronically Signed   By: Earle Gell M.D.   On: 07/06/2018 13:49   Dg C-arm 1-60  Min  Result Date: 07/09/2018 CLINICAL DATA:  Status post ORIF of the right humerus EXAM: RIGHT HUMERUS - 2+ VIEW; DG C-ARM 61-120 MIN COMPARISON:  Preoperative radiograph of the right humerus of July 06, 2018 FINDINGS: Fluoro time reported is 1 minutes, 12 seconds. Six fluoro spot images are reviewed. The patient has undergone plate and screw ORIF for a spiral midshaft humeral fracture. Alignment is now near anatomic. IMPRESSION: Status post ORIF for an angulated midshaft spiral fracture of the right humerus. Electronically Signed   By: David  Martinique M.D.   On: 07/09/2018 12:23   Dg C-arm 1-60 Min  Result Date: 07/09/2018 CLINICAL DATA:  Status post ORIF of the right humerus EXAM: RIGHT HUMERUS - 2+ VIEW; DG C-ARM 61-120 MIN COMPARISON:  Preoperative radiograph of the right humerus of July 06, 2018 FINDINGS: Fluoro time reported is 1 minutes, 12 seconds. Six fluoro spot images are reviewed. The patient has undergone plate and screw ORIF for a spiral midshaft humeral fracture. Alignment is now near anatomic. IMPRESSION: Status post ORIF for an angulated midshaft spiral fracture of the right humerus. Electronically Signed   By: David  Martinique M.D.   On: 07/09/2018 12:23   Mr Jodene Nam Head Wo Contrast  Result Date: 07/06/2018 CLINICAL DATA:  Dizziness EXAM: MRI HEAD WITHOUT CONTRAST MRA HEAD WITHOUT CONTRAST TECHNIQUE: Multiplanar, multiecho pulse sequences of the brain and surrounding structures were obtained without intravenous contrast. Angiographic images of the head were obtained using MRA technique without contrast. COMPARISON:  Head CT 07/06/2018 FINDINGS: MRI HEAD FINDINGS BRAIN: There is no acute infarct, acute hemorrhage or mass effect. Partially empty sella. There are no old infarcts. The white matter signal is normal for the patient's age. The CSF spaces are normal for age, with no hydrocephalus. Susceptibility-sensitive sequences show no chronic microhemorrhage or superficial siderosis. SKULL  AND UPPER CERVICAL SPINE: The visualized skull base, calvarium, upper cervical spine and extracranial soft tissues are normal. SINUSES/ORBITS: No fluid levels or advanced mucosal thickening. No mastoid or middle ear effusion. The orbits are normal. MRA HEAD FINDINGS Intracranial internal carotid arteries: Moderate-to-severe stenosis of the cavernous segment of the right ICA. Anterior cerebral arteries: Normal. Middle cerebral arteries: Normal. Posterior communicating  arteries: Present bilaterally. Posterior cerebral arteries: Normal. Basilar artery: Normal. Vertebral arteries: Left dominant. Normal. Superior cerebellar arteries: Normal. Inferior cerebellar arteries: Normal. IMPRESSION: 1. No acute intracranial abnormality. Normal appearance of the brain for age. 2. 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 Otherwise normal intracranial MRA. Electronically Signed   By: Ulyses Jarred M.D.   On: 07/06/2018 22:26    Lab Data:  CBC: Recent Labs  Lab 07/06/18 1144 07/08/18 0622 07/09/18 0407 07/10/18 0754  WBC 5.9 6.9 7.0 10.1  HGB 10.3* 9.2* 10.6* 8.8*  HCT 35.0* 32.0* 35.5* 29.1*  MCV 91.9 93.3 90.3 89.8  PLT 215 178 151 270   Basic Metabolic Panel: Recent Labs  Lab 07/06/18 1144 07/07/18 0514 07/08/18 0622 07/09/18 0407 07/10/18 0754  NA 138 139 139 139 133*  K 5.1 4.6 4.5 4.6 5.3*  CL 107 110 108 105 101  CO2 20* 20* 22 23 22   GLUCOSE 219* 132* 138* 141* 228*  BUN 31* 27* 22 22 29*  CREATININE 1.31* 1.20* 1.05* 0.97 1.35*  CALCIUM 9.6 9.0 8.9 9.5 8.9  MG  --   --  1.9  --   --    GFR: Estimated Creatinine Clearance: 51.6 mL/min (A) (by C-G formula based on SCr of 1.35 mg/dL (H)). Liver Function Tests: Recent Labs  Lab 07/07/18 0514  AST 30  ALT 47*  ALKPHOS 60  BILITOT 0.7  PROT 6.0*  ALBUMIN 3.3*   No results for input(s): LIPASE, AMYLASE in the last 168 hours. No results for input(s): AMMONIA in the last 168  hours. Coagulation Profile: No results for input(s): INR, PROTIME in the last 168 hours. Cardiac Enzymes: Recent Labs  Lab 07/06/18 1144  TROPONINI <0.03   BNP (last 3 results) No results for input(s): PROBNP in the last 8760 hours. HbA1C: No results for input(s): HGBA1C in the last 72 hours. CBG: Recent Labs  Lab 07/09/18 1247 07/09/18 1709 07/09/18 2135 07/10/18 0658 07/10/18 1135  GLUCAP 105* 181* 354* 251* 181*   Lipid Profile: No results for input(s): CHOL, HDL, LDLCALC, TRIG, CHOLHDL, LDLDIRECT in the last 72 hours. Thyroid Function Tests: No results for input(s): TSH, T4TOTAL, FREET4, T3FREE, THYROIDAB in the last 72 hours. Anemia Panel: No results for input(s): VITAMINB12, FOLATE, FERRITIN, TIBC, IRON, RETICCTPCT in the last 72 hours. Urine analysis:    Component Value Date/Time   COLORURINE YELLOW 07/06/2018 1432   APPEARANCEUR CLEAR 07/06/2018 1432   LABSPEC 1.015 07/06/2018 1432   PHURINE 5.0 07/06/2018 1432   GLUCOSEU >=500 (A) 07/06/2018 1432   HGBUR NEGATIVE 07/06/2018 1432   BILIRUBINUR NEGATIVE 07/06/2018 1432   KETONESUR NEGATIVE 07/06/2018 1432   PROTEINUR 30 (A) 07/06/2018 1432   NITRITE NEGATIVE 07/06/2018 1432   LEUKOCYTESUR NEGATIVE 07/06/2018 1432     Ripudeep Rai M.D. [facility] 07/10/2018, 11:50 AM  Pager: [phone] Between 7am to 7pm - call Pager - [phone]  After 7pm go to [email or web address] - password [email or web address]  Call night coverage person covering after 7pm

## 2018-07-10 NOTE — Clinical Social Work Note (Signed)
Clinical Social Work Assessment  Patient Details  Name: Jocelyn Kaufman MRN: [id number] Date of Birth: 1956-05-11  Date of referral:  07/10/18               Reason for consult:  Facility Placement                Permission sought to share information with:  Facility Art therapist granted to share information::  Yes, Verbal Permission Granted  Name::        Agency::  SNF  Relationship::     Contact Information:     Housing/Transportation Living arrangements for the past 2 months:  Single Family Home Source of Information:  Patient, Medical Team Patient Interpreter Needed:  None Criminal Activity/Legal Involvement Pertinent to Current Situation/Hospitalization:  No - Comment as needed Significant Relationships:  Siblings, Other Family Members Lives with:  Self Do you feel safe going back to the place where you live?  Yes Need for family participation in patient care:  No (Coment)  Care giving concerns:  Patient from home alone and will need short term rehab at discharge.   Social Worker assessment / plan:  CSW met with patient to discuss recommendation for SNF. CSW provided bed offers and discussed expectations and answered questions. CSW to follow up with [facility].  Employment status:  Other Network engineer) Insurance information:  Managed Care PT Recommendations:  [place] / Referral to community resources:  [place]  Patient/Family's Response to care:  Patient agreeable to SNF.  Patient/Family's Understanding of and Emotional Response to Diagnosis, Current Treatment, and Prognosis:  Patient was sleeping upon CSW arrival and struggled with waking up, said she didn't know what questions to ask but just needed more information. Patient discussed how she has been to [facility] with her church, as well as her sister was at [employer], so either of those would be OK.  Emotional Assessment Appearance:  Appears stated  age Attitude/Demeanor/Rapport:  Engaged Affect (typically observed):  Pleasant Orientation:  Oriented to Self, Oriented to Place, Oriented to Situation, Oriented to  Time Alcohol / Substance use:  Not Applicable Psych involvement (Current and /or in the community):  No (Comment)  Discharge Needs  Concerns to be addressed:  Care Coordination Readmission within the last 30 days:  No Current discharge risk:  Physical Impairment, Dependent with Mobility, Lives alone Barriers to Discharge:  Continued Medical Work up, Ship broker, Programmer, applications ([place])   Geralynn Ochs, LCSW 07/10/2018, 4:43 PM

## 2018-07-11 ENCOUNTER — Encounter (HOSPITAL_COMMUNITY): Payer: Self-pay | Admitting: Orthopaedic Surgery

## 2018-07-11 LAB — RENAL FUNCTION PANEL
Albumin: 3.2 g/dL — ABNORMAL LOW (ref 3.5–5.0)
Anion gap: 11 (ref 5–15)
BUN: 43 mg/dL — ABNORMAL HIGH (ref 8–23)
CO2: 22 mmol/L (ref 22–32)
Calcium: 8.8 mg/dL — ABNORMAL LOW (ref 8.9–10.3)
Chloride: 103 mmol/L (ref 98–111)
Creatinine, Ser: 1.6 mg/dL — ABNORMAL HIGH (ref 0.44–1.00)
GFR calc Af Amer: 39 mL/min — ABNORMAL LOW (ref >60)
GFR calc non Af Amer: 33 mL/min — ABNORMAL LOW (ref >60)
Glucose, Bld: 143 mg/dL — ABNORMAL HIGH (ref 70–99)
Phosphorus: 5 mg/dL — ABNORMAL HIGH (ref 2.5–4.6)
Potassium: 4.3 mmol/L (ref 3.5–5.1)
Sodium: 136 mmol/L (ref 135–145)

## 2018-07-11 LAB — GLUCOSE, CAPILLARY
Glucose-Capillary: 114 mg/dL — ABNORMAL HIGH (ref 70–99)
Glucose-Capillary: 139 mg/dL — ABNORMAL HIGH (ref 70–99)
Glucose-Capillary: 152 mg/dL — ABNORMAL HIGH (ref 70–99)
Glucose-Capillary: 243 mg/dL — ABNORMAL HIGH (ref 70–99)

## 2018-07-11 LAB — MAGNESIUM: Magnesium: 2.2 mg/dL (ref 1.7–2.4)

## 2018-07-11 NOTE — Progress Notes (Signed)
Occupational Therapy Treatment Patient Details Name: Jocelyn Kaufman MRN: [id number] DOB: 03-13-1956 Today's Date: 07/11/2018    History of present illness Patient is a 62 y/o female who presents s/p fall from home. Also with dizziness and speech difficulties. XRAY of humerus-Displaced spiral fracture of the humeral diaphysis.  s/p ORIF right humerus. Head CT, MRI-unremarkable. PMH: DM, HTN, HLD, Depression, Cushings disease.    OT comments  Pt making steady progress. Continue to recommend short term rehab at SNF to increase independence with ADL and IADL tasks as pt lives alone and was independent PTA. Will continue to follow acutely.   Follow Up Recommendations  SNF    Equipment Recommendations  None recommended by OT    Recommendations for Other Services      Precautions / Restrictions Precautions Precautions: Fall Required Braces or Orthoses: Sling Restrictions RUE Weight Bearing: Non weight bearing       Mobility Bed Mobility Overal bed mobility: Needs Assistance       Supine to sit: Supervision Sit to supine: Supervision      Transfers Overall transfer level: Needs assistance   Transfers: Sit to/from Stand;Stand Pivot Transfers Sit to Stand: Supervision Stand pivot transfers: Supervision            Balance Overall balance assessment: History of Falls                                         ADL either performed or assessed with clinical judgement   ADL Overall ADL's : Needs assistance/impaired     Grooming: Applying deodorant;Minimal assistance;Sitting   Upper Body Bathing: Minimal assistance;Sitting   Lower Body Bathing: Minimal assistance;Sit to/from stand   Upper Body Dressing : Moderate assistance;Sitting   Lower Body Dressing: Minimal assistance;Sit to/from stand   Toilet Transfer: Min guard;Ambulation           Functional mobility during ADLs: Min guard General ADL Comments: Educated on donning/doffing sling; began  education on compensatory techniques for UB ADL     Vision       Perception     Praxis      Cognition Arousal/Alertness: Awake/alert Behavior During Therapy: Anxious Overall Cognitive Status: Within Functional Limits for tasks assessed                                 General Comments: Appears intact. will further assess for IADL tasks        Exercises Exercises: Other exercises Other Exercises Other Exercises: R elbow A/AAROM with palm sliding on stomach within pain tolerance Other Exercises: R composite flexion/extension Other Exercises:  R supination/pronation Other Exercises: Education on positionining and edema control   Shoulder Instructions       General Comments      Pertinent Vitals/ Pain       Pain Assessment: 0-10 Pain Score: 3  Pain Location: R shoulder Pain Descriptors / Indicators: Aching;Discomfort Pain Intervention(s): Limited activity within patient's tolerance;Ice applied;Repositioned  Home Living                                          Prior Functioning/Environment              Frequency  Min 2X/week  Progress Toward Goals  OT Goals(current goals can now be found in the care plan section)  Progress towards OT goals: Progressing toward goals  Acute Rehab OT Goals Patient Stated Goal: to be able to go to and from the bathroom OT Goal Formulation: With patient Time For Goal Achievement: 07/21/18 Potential to Achieve Goals: Good ADL Goals Pt Will Perform Grooming: with set-up;sitting Pt Will Transfer to Toilet: with min guard assist;ambulating;regular height toilet Additional ADL Goal #1: Pt will demonstrate edema management of R UE ( elevation, hand AROM ice) mod i  Plan Discharge plan remains appropriate    Co-evaluation                 AM-PAC PT "6 Clicks" Daily Activity     Outcome Measure   Help from another person eating meals?: A Little Help from another person taking  care of personal grooming?: A Little Help from another person toileting, which includes using toliet, bedpan, or urinal?: A Little Help from another person bathing (including washing, rinsing, drying)?: A Little Help from another person to put on and taking off regular upper body clothing?: A Lot Help from another person to put on and taking off regular lower body clothing?: A Little 6 Click Score: 17    End of Session    OT Visit Diagnosis: Unsteadiness on feet (R26.81);Repeated falls (R29.6);Muscle weakness (generalized) (M62.81);Pain Pain - Right/Left: Right Pain - part of body: Arm   Activity Tolerance Patient tolerated treatment well   Patient Left in bed;with call bell/phone within reach;with bed alarm set   Nurse Communication Mobility status        Time: 9753-0051 OT Time Calculation (min): 23 min  Charges: OT General Charges $OT Visit: 1 Visit OT Treatments $Self Care/Home Management : 23-37 mins  Maurie Boettcher, OT/L   Acute OT Clinical Specialist [place] Pager [phone] Office [phone]    [facility] 07/11/2018, 5:02 PM

## 2018-07-11 NOTE — Clinical Social Work Note (Addendum)
[facility] started insurance authorization yesterday afternoon.  Dayton Scrape, [place] [phone]  12:13 pm Insurance authorization and PASARR still pending.  Dayton Scrape, [place] [phone]  12:20 pm PASARR obtained: [id number]. Expires 10/13.  Dayton Scrape, [place] [phone]  4:32 pm Insurance authorization still pending. SNF admissions coordinator will follow up in the morning.  Dayton Scrape, [place]

## 2018-07-11 NOTE — Plan of Care (Signed)
  Problem: Education: Goal: Knowledge of General Education information will improve Description: Including pain rating scale, medication(s)/side effects and non-pharmacologic comfort measures Outcome: Progressing   Problem: Health Behavior/Discharge Planning: Goal: Ability to manage health-related needs will improve Outcome: Progressing   Problem: Clinical Measurements: Goal: Ability to maintain clinical measurements within normal limits will improve Outcome: Progressing Goal: Will remain free from infection Outcome: Progressing Goal: Diagnostic test results will improve Outcome: Progressing Goal: Respiratory complications will improve Outcome: Progressing Goal: Cardiovascular complication will be avoided Outcome: Progressing   Problem: Activity: Goal: Risk for activity intolerance will decrease Outcome: Progressing   Problem: Nutrition: Goal: Adequate nutrition will be maintained Outcome: Progressing   Problem: Coping: Goal: Level of anxiety will decrease Outcome: Progressing   Problem: Elimination: Goal: Will not experience complications related to bowel motility Outcome: Progressing Goal: Will not experience complications related to urinary retention Outcome: Progressing   Problem: Pain Managment: Goal: General experience of comfort will improve Outcome: Progressing   Problem: Safety: Goal: Ability to remain free from injury will improve Outcome: Progressing   Problem: Skin Integrity: Goal: Risk for impaired skin integrity will decrease Outcome: Progressing   Problem: Education: Goal: Knowledge of disease or condition will improve Outcome: Progressing Goal: Knowledge of secondary prevention will improve Outcome: Progressing Goal: Knowledge of patient specific risk factors addressed and post discharge goals established will improve Outcome: Progressing Goal: Individualized Educational Video(s) Outcome: Progressing   Problem: Coping: Goal: Will verbalize  positive feelings about self Outcome: Progressing Goal: Will identify appropriate support needs Outcome: Progressing   Problem: Health Behavior/Discharge Planning: Goal: Ability to manage health-related needs will improve Outcome: Progressing   Problem: Self-Care: Goal: Ability to participate in self-care as condition permits will improve Outcome: Progressing Goal: Verbalization of feelings and concerns over difficulty with self-care will improve Outcome: Progressing   

## 2018-07-11 NOTE — Progress Notes (Signed)
[facility]                                                                              Patient Demographics  Jocelyn Kaufman, is a 62 y.o. female, DOB - Feb 03, 1956, FYB:[id number]  Admit date - 07/06/2018   Admitting Physician Cristal Deer, MD  Outpatient Primary MD for the patient is Deland Pretty, MD  Outpatient specialists:   LOS - 5  days   Medical records reviewed and are as summarized below:    Chief Complaint  Patient presents with  . Fall  . Dizziness  . Arm Pain  . Head Injury       Brief summary   62 year old with past medical history relevant for type 2 diabetes on insulin, anxiety/depression, hypothyroidism, possible adrenal insufficiency, headache disorder, hypertension who came in with vertigo, fall, expressive dysphasia that is concerning for TIA.  Following the fall, the patient was noted to have sustained humeral fracture on right arm.  Patient has undergone open reduction and internal fixation.  Orthopedic and neurology input is highly appreciated.  Disposition is being awaited.  Discussed life alert on discharge. ??Need for 30 day Holter monitor on discharge.   Assessment & Plan    Principal Problem: Right humeral fracture (right humeral shaft fracture with significant displacement):  -Status post ORIF right humeral shaft fracture, postop day #2 -Pain control and DVT prophylaxis per orthopedics -Start PT today -Patient lives alone at home.  Consider life alert on discharge.   -Pursue disposition.     Active problems TIA -Presented with expressive aphasia, vertigo. 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 -2D echo 9/9 showed grade 1 diastolic dysfunction -LDL 126, continue Lipitor 40 mg daily -Hemoglobin A1c 7.9, continue aspirin 325 mg daily -Neurology input is appreciated.   -PT OT consult recommended skilled nursing  facility for rehab -Probably need for 30-day Holter monitor.  Essential hypertension -BP currently stable, continue amlodipine, HCTZ, irbesartan, hydralazine  -Optimized  Type 2 diabetes mellitus, insulin-dependent, uncontrolled with hyperglycemia -Hemoglobin A1c 7.9 -For now hold Trulicity, metformin -CBGs overnight and this morning elevated, per patient had a late dinner brought by family -Continue Lantus, placed on NovoLog meal coverage 3 units 3 times daily before meals with sliding scale insulin 07/11/2018: Blood sugar this morning was 114.   Hypothyroidism -Continue Synthroid  Depression -Stable, no SI/HI, continue Lexapro 20 mg daily  Constipation -States has not had BM in the last 3 days, mag citrate x1, continue Senokot as   Code Status: Full CODE STATUS DVT Prophylaxis:  Lovenox  Family Communication:  Disposition Plan: Skilled nursing facility  Time Spent in minutes 25  Procedures:  MRI/MRA, 2D echo, carotid Dopplers ORIF right humeral shaft fracture 9/11  Consultants:   Neurology Orthopedics  Antimicrobials:   None   Medications  Scheduled Meds: . acetaminophen  1,000 mg Oral Q8H  . amLODipine  5 mg Oral Daily  . aspirin  300 mg Rectal Daily   Or  . aspirin  325 mg Oral Daily  . atorvastatin  40  mg Oral q1800  . docusate sodium  100 mg Oral BID  . enoxaparin (LOVENOX) injection  40 mg Subcutaneous Q24H  . escitalopram  20 mg Oral Daily  . ferrous sulfate  325 mg Oral Daily  . folic acid  2 mg Oral Daily  . hydrALAZINE  25 mg Oral BID  . irbesartan  300 mg Oral Daily   And  . hydrochlorothiazide  12.5 mg Oral Daily  . insulin aspart  0-15 Units Subcutaneous TID WC  . insulin aspart  3 Units Subcutaneous TID WC  . insulin glargine  60 Units Subcutaneous QHS  . latanoprost  1 drop Both Eyes Daily  . levothyroxine  125 mcg Oral QAC breakfast  . loratadine  10 mg Oral Daily  . magnesium gluconate  500 mg Oral Daily  . multivitamin with  minerals  1 tablet Oral Daily  . vitamin B-12  100 mcg Oral Daily  . Vitamin D (Ergocalciferol)  50,000 Units Oral Q Mon   Continuous Infusions: . sodium chloride     PRN Meds:.albuterol, aspirin-acetaminophen-caffeine, HYDROmorphone (DILAUDID) injection, ibuprofen, LORazepam, metaxalone, metoCLOPramide **OR** metoCLOPramide (REGLAN) injection, ondansetron, oxyCODONE, senna-docusate   Antibiotics   Anti-infectives (From admission, onward)   Start     Dose/Rate Route Frequency Ordered Stop   07/09/18 1630  ceFAZolin (ANCEF) IVPB 1 g/50 mL premix     1 g 100 mL/hr over 30 Minutes Intravenous Every 6 hours 07/09/18 1518 07/10/18 0432   07/09/18 1157  tobramycin (NEBCIN) powder  Status:  Discontinued       As needed 07/09/18 1157 07/09/18 1240   07/09/18 1156  vancomycin (VANCOCIN) powder  Status:  Discontinued       As needed 07/09/18 1156 07/09/18 1240   07/09/18 0730  ceFAZolin (ANCEF) IVPB 2g/100 mL premix     2 g 200 mL/hr over 30 Minutes Intravenous To Short Stay 07/09/18 0707 07/09/18 1053        Subjective:  Patient seen. No vertigo reported Dizziness Pain is well controlled  Objective:   Vitals:   07/10/18 1951 07/10/18 2341 07/11/18 0401 07/11/18 0856  BP: (!) 114/59 118/61 118/68 125/63  Pulse: 72 71 68 71  Resp: 18 18 18 16   Temp: 99.4 F (37.4 C) 98 F (36.7 C) 98.1 F (36.7 C) 98.4 F (36.9 C)  TempSrc: Oral Oral Oral Oral  SpO2: 98% 96% 95% 100%  Weight:      Height:        Intake/Output Summary (Last 24 hours) at 07/11/2018 1028 Last data filed at 07/11/2018 0858 Gross per 24 hour  Intake 320 ml  Output -  Net 320 ml     Wt Readings from Last 3 Encounters:  07/09/18 107.2 kg  07/04/18 104.4 kg  06/27/18 105.9 kg     Exam  General: Alert and oriented x 3, NAD.  Morbidly obese HEENT:  Atraumatic, normocephalic.  Mild pallor, no jaundice. Cardiovascular: S1 S2 auscultated,  Regular rate and rhythm. No pedal edema b/l Respiratory: Clear  to auscultation bilaterally, no wheezing, rales or rhonchi Gastrointestinal: Obese, soft, nontender.  Organs are difficult to assess. Ext: no pedal edema bilaterally Neuro: No new deficits Musculoskeletal: Right arm in sling Skin: No rashes   Data Reviewed:  I have personally reviewed following labs and imaging studies  Micro Results Recent Results (from the past 240 hour(s))  MRSA PCR Screening     Status: None   Collection Time: 07/09/18  4:07 AM  Result Value Ref Range  Status   MRSA by PCR NEGATIVE NEGATIVE Final    Comment:        The GeneXpert MRSA Assay (FDA approved for NASAL specimens only), is one component of a comprehensive MRSA colonization surveillance program. It is not intended to diagnose MRSA infection nor to guide or monitor treatment for MRSA infections. Performed at [facility], [place] [address] [place], [place] [address]     Radiology Reports Dg Chest 2 View  Result Date: 07/06/2018 CLINICAL DATA:  Acute RIGHT chest pain following fall. Initial encounter. EXAM: CHEST - 2 VIEW COMPARISON:  04/05/2017 and prior radiographs FINDINGS: Mild cardiomegaly again noted. There is no evidence of focal airspace disease, pulmonary edema, suspicious pulmonary nodule/mass, pleural effusion, or pneumothorax. No acute bony abnormalities are identified. IMPRESSION: Mild cardiomegaly without evidence of acute cardiopulmonary disease. Electronically Signed   By: Margarette Canada M.D.   On: 07/06/2018 21:34   Dg Shoulder Right  Result Date: 07/06/2018 CLINICAL DATA:  Dizzy.  Fall.  Right arm and shoulder pain. EXAM: RIGHT SHOULDER - 2+ VIEW COMPARISON:  None. FINDINGS: An oblique midshaft right humerus fracture demonstrates at least 15 degrees angulation. Fracture demonstrates displacement of 1/2 the shaft width. Degenerative changes present at the shoulder without additional fractures. IMPRESSION: 1. Oblique fracture of the midshaft right humerus with moderate displacement and  angulation. Electronically Signed   By: San Morelle M.D.   On: 07/06/2018 13:49   Ct Head Wo Contrast  Result Date: 07/06/2018 CLINICAL DATA:  Fall.  Dizziness. EXAM: CT HEAD WITHOUT CONTRAST TECHNIQUE: Contiguous axial images were obtained from the base of the skull through the vertex without intravenous contrast. COMPARISON:  None. FINDINGS: Brain: No evidence of acute infarction, hemorrhage, hydrocephalus, extra-axial collection or mass lesion/mass effect. Vascular: Calcified atherosclerosis in the intracranial carotids. Skull: Normal. Negative for fracture or focal lesion. Sinuses/Orbits: No acute finding. Other: None. IMPRESSION: No acute intracranial abnormalities. Electronically Signed   By: Dorise Bullion III M.D   On: 07/06/2018 13:28   Mr Brain Wo Contrast  Result Date: 07/06/2018 CLINICAL DATA:  Dizziness EXAM: MRI HEAD WITHOUT CONTRAST MRA HEAD WITHOUT CONTRAST TECHNIQUE: Multiplanar, multiecho pulse sequences of the brain and surrounding structures were obtained without intravenous contrast. Angiographic images of the head were obtained using MRA technique without contrast. COMPARISON:  Head CT 07/06/2018 FINDINGS: MRI HEAD FINDINGS BRAIN: There is no acute infarct, acute hemorrhage or mass effect. Partially empty sella. There are no old infarcts. The white matter signal is normal for the patient's age. The CSF spaces are normal for age, with no hydrocephalus. Susceptibility-sensitive sequences show no chronic microhemorrhage or superficial siderosis. SKULL AND UPPER CERVICAL SPINE: The visualized skull base, calvarium, upper cervical spine and extracranial soft tissues are normal. SINUSES/ORBITS: No fluid levels or advanced mucosal thickening. No mastoid or middle ear effusion. The orbits are normal. MRA HEAD FINDINGS Intracranial internal carotid arteries: Moderate-to-severe stenosis of the cavernous segment of the right ICA. Anterior cerebral arteries: Normal. Middle cerebral  arteries: Normal. Posterior communicating arteries: Present bilaterally. Posterior cerebral arteries: Normal. Basilar artery: Normal. Vertebral arteries: Left dominant. Normal. Superior cerebellar arteries: Normal. Inferior cerebellar arteries: Normal. IMPRESSION: 1. No acute intracranial abnormality. Normal appearance of the brain for age. 2. 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 Otherwise normal intracranial MRA. Electronically Signed   By: Ulyses Jarred M.D.   On: 07/06/2018 22:26   Dg Humerus Right  Result Date: 07/09/2018 CLINICAL DATA:  Status post right humeral ORIF. EXAM:  RIGHT HUMERUS - 2+ VIEW COMPARISON:  July 09, 2018 intraoperative film. FINDINGS: Status post fixation of the right humerus with orthopedic side plate fixated by horizontal screws without malalignment. IMPRESSION: Status post fixation of the right humerus with orthopedic side plate fixated by horizontal screws without malalignment. Electronically Signed   By: Abelardo Diesel M.D.   On: 07/09/2018 15:16   Dg Humerus Right  Result Date: 07/09/2018 CLINICAL DATA:  Status post ORIF of the right humerus EXAM: RIGHT HUMERUS - 2+ VIEW; DG C-ARM 61-120 MIN COMPARISON:  Preoperative radiograph of the right humerus of July 06, 2018 FINDINGS: Fluoro time reported is 1 minutes, 12 seconds. Six fluoro spot images are reviewed. The patient has undergone plate and screw ORIF for a spiral midshaft humeral fracture. Alignment is now near anatomic. IMPRESSION: Status post ORIF for an angulated midshaft spiral fracture of the right humerus. Electronically Signed   By: David  Martinique M.D.   On: 07/09/2018 12:23   Dg Humerus Right  Result Date: 07/06/2018 CLINICAL DATA:  Fall.  Right upper arm pain.  Initial encounter. EXAM: RIGHT HUMERUS - 2+ VIEW COMPARISON:  None. FINDINGS: A spiral fracture of the humeral diaphysis is seen with medial displacement and angulation of the distal fracture fragment.  Generalized osteopenia noted. IMPRESSION: Displaced spiral fracture of the humeral diaphysis. Electronically Signed   By: Earle Gell M.D.   On: 07/06/2018 13:49   Dg C-arm 1-60 Min  Result Date: 07/09/2018 CLINICAL DATA:  Status post ORIF of the right humerus EXAM: RIGHT HUMERUS - 2+ VIEW; DG C-ARM 61-120 MIN COMPARISON:  Preoperative radiograph of the right humerus of July 06, 2018 FINDINGS: Fluoro time reported is 1 minutes, 12 seconds. Six fluoro spot images are reviewed. The patient has undergone plate and screw ORIF for a spiral midshaft humeral fracture. Alignment is now near anatomic. IMPRESSION: Status post ORIF for an angulated midshaft spiral fracture of the right humerus. Electronically Signed   By: David  Martinique M.D.   On: 07/09/2018 12:23   Dg C-arm 1-60 Min  Result Date: 07/09/2018 CLINICAL DATA:  Status post ORIF of the right humerus EXAM: RIGHT HUMERUS - 2+ VIEW; DG C-ARM 61-120 MIN COMPARISON:  Preoperative radiograph of the right humerus of July 06, 2018 FINDINGS: Fluoro time reported is 1 minutes, 12 seconds. Six fluoro spot images are reviewed. The patient has undergone plate and screw ORIF for a spiral midshaft humeral fracture. Alignment is now near anatomic. IMPRESSION: Status post ORIF for an angulated midshaft spiral fracture of the right humerus. Electronically Signed   By: David  Martinique M.D.   On: 07/09/2018 12:23   Mr Jodene Nam Head Wo Contrast  Result Date: 07/06/2018 CLINICAL DATA:  Dizziness EXAM: MRI HEAD WITHOUT CONTRAST MRA HEAD WITHOUT CONTRAST TECHNIQUE: Multiplanar, multiecho pulse sequences of the brain and surrounding structures were obtained without intravenous contrast. Angiographic images of the head were obtained using MRA technique without contrast. COMPARISON:  Head CT 07/06/2018 FINDINGS: MRI HEAD FINDINGS BRAIN: There is no acute infarct, acute hemorrhage or mass effect. Partially empty sella. There are no old infarcts. The white matter signal is normal  for the patient's age. The CSF spaces are normal for age, with no hydrocephalus. Susceptibility-sensitive sequences show no chronic microhemorrhage or superficial siderosis. SKULL AND UPPER CERVICAL SPINE: The visualized skull base, calvarium, upper cervical spine and extracranial soft tissues are normal. SINUSES/ORBITS: No fluid levels or advanced mucosal thickening. No mastoid or middle ear effusion. The orbits are normal. MRA HEAD FINDINGS Intracranial  internal carotid arteries: Moderate-to-severe stenosis of the cavernous segment of the right ICA. Anterior cerebral arteries: Normal. Middle cerebral arteries: Normal. Posterior communicating arteries: Present bilaterally. Posterior cerebral arteries: Normal. Basilar artery: Normal. Vertebral arteries: Left dominant. Normal. Superior cerebellar arteries: Normal. Inferior cerebellar arteries: Normal. IMPRESSION: 1. No acute intracranial abnormality. Normal appearance of the brain for age. 2. 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 Otherwise normal intracranial MRA. Electronically Signed   By: Ulyses Jarred M.D.   On: 07/06/2018 22:26    Lab Data:  CBC: Recent Labs  Lab 07/06/18 1144 07/08/18 0622 07/09/18 0407 07/10/18 0754  WBC 5.9 6.9 7.0 10.1  HGB 10.3* 9.2* 10.6* 8.8*  HCT 35.0* 32.0* 35.5* 29.1*  MCV 91.9 93.3 90.3 89.8  PLT 215 178 151 703   Basic Metabolic Panel: Recent Labs  Lab 07/06/18 1144 07/07/18 0514 07/08/18 0622 07/09/18 0407 07/10/18 0754  NA 138 139 139 139 133*  K 5.1 4.6 4.5 4.6 5.3*  CL 107 110 108 105 101  CO2 20* 20* 22 23 22   GLUCOSE 219* 132* 138* 141* 228*  BUN 31* 27* 22 22 29*  CREATININE 1.31* 1.20* 1.05* 0.97 1.35*  CALCIUM 9.6 9.0 8.9 9.5 8.9  MG  --   --  1.9  --   --    GFR: Estimated Creatinine Clearance: 51.6 mL/min (A) (by C-G formula based on SCr of 1.35 mg/dL (H)). Liver Function Tests: Recent Labs  Lab 07/07/18 0514  AST 30  ALT 47*  ALKPHOS  60  BILITOT 0.7  PROT 6.0*  ALBUMIN 3.3*   No results for input(s): LIPASE, AMYLASE in the last 168 hours. No results for input(s): AMMONIA in the last 168 hours. Coagulation Profile: No results for input(s): INR, PROTIME in the last 168 hours. Cardiac Enzymes: Recent Labs  Lab 07/06/18 1144  TROPONINI <0.03   BNP (last 3 results) No results for input(s): PROBNP in the last 8760 hours. HbA1C: No results for input(s): HGBA1C in the last 72 hours. CBG: Recent Labs  Lab 07/10/18 0658 07/10/18 1135 07/10/18 1706 07/10/18 2143 07/11/18 0613  GLUCAP 251* 181* 179* 184* 114*   Lipid Profile: No results for input(s): CHOL, HDL, LDLCALC, TRIG, CHOLHDL, LDLDIRECT in the last 72 hours. Thyroid Function Tests: No results for input(s): TSH, T4TOTAL, FREET4, T3FREE, THYROIDAB in the last 72 hours. Anemia Panel: No results for input(s): VITAMINB12, FOLATE, FERRITIN, TIBC, IRON, RETICCTPCT in the last 72 hours. Urine analysis:    Component Value Date/Time   COLORURINE YELLOW 07/06/2018 1432   APPEARANCEUR CLEAR 07/06/2018 1432   LABSPEC 1.015 07/06/2018 1432   PHURINE 5.0 07/06/2018 1432   GLUCOSEU >=500 (A) 07/06/2018 1432   HGBUR NEGATIVE 07/06/2018 1432   BILIRUBINUR NEGATIVE 07/06/2018 1432   KETONESUR NEGATIVE 07/06/2018 1432   PROTEINUR 30 (A) 07/06/2018 1432   NITRITE NEGATIVE 07/06/2018 1432   LEUKOCYTESUR NEGATIVE 07/06/2018 1432     Bonnell Public M.D. [facility] 07/11/2018, 10:28 AM  Between 7am to 7pm - call Pager - [phone]  After 7pm go to [email or web address] - password [email or web address]  Call night coverage person covering after 7pm

## 2018-07-11 NOTE — Progress Notes (Signed)
Physical Therapy Treatment Patient Details Name: Jocelyn Kaufman MRN: [id number] DOB: October 24, 1956 Today's Date: 07/11/2018    History of Present Illness Patient is a 62 y/o female who presents s/p fall from home. Also with dizziness and speech difficulties. XRAY of humerus-Displaced spiral fracture of the humeral diaphysis.  s/p ORIF right humerus. Head CT, MRI-unremarkable. PMH: DM, HTN, HLD, Depression, Cushings disease.     PT Comments    Patient progressing well towards PT goals. Improved ambulation distance today but still demonstrates balance deficits and dizziness putting pt at increased risk for falls. Reports continued pain in RUE but improved with proper fitting sling. Eager to get to rehab so pt can maximize independence.  Will follow.   Follow Up Recommendations  SNF;Supervision for mobility/OOB     Equipment Recommendations  None recommended by PT    Recommendations for Other Services       Precautions / Restrictions Precautions Precautions: Fall Precaution Comments: dizziness Required Braces or Orthoses: Sling Restrictions Weight Bearing Restrictions: Yes RUE Weight Bearing: Non weight bearing    Mobility  Bed Mobility Overal bed mobility: Needs Assistance Bed Mobility: Supine to Sit     Supine to sit: Min guard;HOB elevated Sit to supine: Min guard;HOB elevated   General bed mobility comments: Able to bring trunk up to sitting position without assist today with increased time. Able to bring LEs into bed without assist.  Transfers Overall transfer level: Needs assistance Equipment used: None Transfers: Sit to/from Stand Sit to Stand: Min guard         General transfer comment: Min guard for safety/balance.   Ambulation/Gait Ambulation/Gait assistance: Min guard Gait Distance (Feet): 120 Feet Assistive device: None Gait Pattern/deviations: Step-through pattern;Decreased stride length Gait velocity: decreased   General Gait Details: Slow, mildly  unsteady gait esp with head turns. Close Min guard for safety due to balance deficits/dizziness.   Stairs             Wheelchair Mobility    Modified Rankin (Stroke Patients Only) Modified Rankin (Stroke Patients Only) Pre-Morbid Rankin Score: Slight disability Modified Rankin: Moderately severe disability     Balance Overall balance assessment: Needs assistance Sitting-balance support: Feet supported;No upper extremity supported Sitting balance-Leahy Scale: Good     Standing balance support: During functional activity Standing balance-Leahy Scale: Fair Standing balance comment: no UE support in standing                            Cognition Arousal/Alertness: Awake/alert Behavior During Therapy: WFL for tasks assessed/performed Overall Cognitive Status: Within Functional Limits for tasks assessed                                        Exercises      General Comments        Pertinent Vitals/Pain Pain Assessment: Faces Faces Pain Scale: Hurts little more Pain Location: R shoulder Pain Descriptors / Indicators: Aching;Grimacing;Guarding;Dull Pain Intervention(s): Monitored during session;Repositioned    Home Living                      Prior Function            PT Goals (current goals can now be found in the care plan section) Progress towards PT goals: Progressing toward goals    Frequency    Min 3X/week  PT Plan Current plan remains appropriate    Co-evaluation              AM-PAC PT "6 Clicks" Daily Activity  Outcome Measure  Difficulty turning over in bed (including adjusting bedclothes, sheets and blankets)?: None Difficulty moving from lying on back to sitting on the side of the bed? : None Difficulty sitting down on and standing up from a chair with arms (e.g., wheelchair, bedside commode, etc,.)?: A Little Help needed moving to and from a bed to chair (including a wheelchair)?: A  Little Help needed walking in hospital room?: A Little Help needed climbing 3-5 steps with a railing? : A Lot 6 Click Score: 19    End of Session Equipment Utilized During Treatment: Gait belt;Other (comment)(sling) Activity Tolerance: Patient tolerated treatment well Patient left: in bed;with call bell/phone within reach;with bed alarm set Nurse Communication: Mobility status PT Visit Diagnosis: Unsteadiness on feet (R26.81);Pain;Difficulty in walking, not elsewhere classified (R26.2) Pain - Right/Left: Right Pain - part of body: Arm     Time: 0935-1005 PT Time Calculation (min) (ACUTE ONLY): 30 min  Charges:  $Gait Training: 8-22 mins $Therapeutic Activity: 8-22 mins                     Wray Kearns, PT, DPT Acute Rehabilitation Services Pager [phone] Office [place] 07/11/2018, 11:58 AM

## 2018-07-12 LAB — RENAL FUNCTION PANEL
Albumin: 3.2 g/dL — ABNORMAL LOW (ref 3.5–5.0)
Anion gap: 10 (ref 5–15)
BUN: 47 mg/dL — ABNORMAL HIGH (ref 8–23)
CO2: 23 mmol/L (ref 22–32)
Calcium: 8.7 mg/dL — ABNORMAL LOW (ref 8.9–10.3)
Chloride: 103 mmol/L (ref 98–111)
Creatinine, Ser: 1.35 mg/dL — ABNORMAL HIGH (ref 0.44–1.00)
GFR calc Af Amer: 48 mL/min — ABNORMAL LOW (ref >60)
GFR calc non Af Amer: 41 mL/min — ABNORMAL LOW (ref >60)
Glucose, Bld: 128 mg/dL — ABNORMAL HIGH (ref 70–99)
Phosphorus: 4.3 mg/dL (ref 2.5–4.6)
Potassium: 4.6 mmol/L (ref 3.5–5.1)
Sodium: 136 mmol/L (ref 135–145)

## 2018-07-12 LAB — GLUCOSE, CAPILLARY
Glucose-Capillary: 126 mg/dL — ABNORMAL HIGH (ref 70–99)
Glucose-Capillary: 183 mg/dL — ABNORMAL HIGH (ref 70–99)
Glucose-Capillary: 224 mg/dL — ABNORMAL HIGH (ref 70–99)

## 2018-07-12 LAB — MAGNESIUM: Magnesium: 2.6 mg/dL — ABNORMAL HIGH (ref 1.7–2.4)

## 2018-07-12 MED ORDER — DOCUSATE SODIUM 100 MG PO CAPS: 100.0000 mg | ORAL_CAPSULE | Freq: Two times a day (BID) | ORAL | 0 refills | Status: DC

## 2018-07-12 MED ORDER — SENNOSIDES-DOCUSATE SODIUM 8.6-50 MG PO TABS: 1.0000 | ORAL_TABLET | Freq: Every evening | ORAL | 0 refills | Status: DC | PRN

## 2018-07-12 MED ORDER — VITAMIN D (ERGOCALCIFEROL) 1.25 MG (50000 UNIT) PO CAPS: 50000.0000 [IU] | ORAL_CAPSULE | ORAL | 0 refills | Status: DC

## 2018-07-12 MED ORDER — ASPIRIN 325 MG PO TABS: 325.0000 mg | ORAL_TABLET | Freq: Every day | ORAL | 0 refills | Status: DC

## 2018-07-12 MED ORDER — HYDROCODONE-ACETAMINOPHEN 5-325 MG PO TABS: 1.0000 | ORAL_TABLET | ORAL | 0 refills | Status: DC | PRN

## 2018-07-12 MED ORDER — INSULIN ASPART 100 UNIT/ML ~~LOC~~ SOLN: 0.0000 [IU] | Freq: Three times a day (TID) | SUBCUTANEOUS | 11 refills | Status: DC

## 2018-07-12 MED ORDER — INSULIN GLARGINE 100 UNIT/ML ~~LOC~~ SOLN: 60.0000 [IU] | Freq: Every day | SUBCUTANEOUS | 11 refills | Status: DC

## 2018-07-12 MED ORDER — ATORVASTATIN CALCIUM 40 MG PO TABS: 40.0000 mg | ORAL_TABLET | Freq: Every day | ORAL | 0 refills | Status: DC

## 2018-07-12 NOTE — Progress Notes (Signed)
Attempted to call report to [facility]. Receptions took down my name and direct number; and stated that a nurse would call back to get report.

## 2018-07-12 NOTE — Clinical Social Work Note (Signed)
Clinical Social Worker facilitated patient discharge including contacting patient family and facility to confirm patient discharge plans.  Clinical information faxed to facility and family agreeable with plan.  CSW arranged ambulance transport via [facility] to [facility].  RN to call [phone] for report prior to discharge.  Clinical Social Worker will sign off for now as social work intervention is no longer needed. Please consult us again if new need arises.  Raheim Beutler, LCSWA [phone]  

## 2018-07-12 NOTE — Clinical Social Work Placement (Signed)
   CLINICAL SOCIAL WORK PLACEMENT  NOTE  Date:  07/12/2018  Patient Details  Name: Jocelyn Kaufman MRN: [id number] Date of Birth: [birth year]  Clinical Social Work is seeking post-discharge placement for this patient at the [place] level of care (*CSW will initial, date and re-position this form in  chart as items are completed):      Patient/family provided with [place] Work Department's list of facilities offering this level of care within the geographic area requested by the patient (or if unable, by the patient's family).  Yes   Patient/family informed of their freedom to choose among providers that offer the needed level of care, that participate in Medicare, Medicaid or managed care program needed by the patient, have an available bed and are willing to accept the patient.      Patient/family informed of [facility]'s ownership interest in [facility] and [facility], as well as of the fact that they are under no obligation to receive care at these facilities.  PASRR submitted to EDS on       PASRR number received on 07/10/18     Existing PASRR number confirmed on       FL2 transmitted to all facilities in geographic area requested by pt/family on 07/10/18     FL2 transmitted to all facilities within larger geographic area on       Patient informed that his/her managed care company has contracts with or will negotiate with certain facilities, including the following:        Yes   Patient/family informed of bed offers received.  Patient chooses bed at [facility]     Physician recommends and patient chooses bed at      Patient to be transferred to [facility] on 07/12/18.  Patient to be transferred to facility by [facility]     Patient family notified on 07/12/18 of transfer.  Name of family member notified:  Silva Bandy, sister     PHYSICIAN       Additional Comment:    _______________________________________________ Eileen Stanford, LCSW 07/12/2018, 1:21 PM

## 2018-07-12 NOTE — Discharge Summary (Signed)
Physician Discharge Summary  Patient ID: VEE BAHE MRN: [id number] DOB/AGE: 62-Feb-1957 62 y.o.  Admit date: 07/06/2018 Discharge date: 07/12/2018  Admission Diagnoses:  Discharge Diagnoses:  Principal Problem:   Fall at home, initial encounter Active Problems:   Diabetes mellitus ([place])   Essential hypertension   Displaced spiral fracture of shaft of humerus, right arm, initial encounter for closed fracture   TIA (transient ischemic attack)   Discharged Condition: stable  Hospital Course: Patient is a 63 year old African-American female, with past medical history significant for type 2 diabetes on insulin, anxiety/depression, hypothyroidism, possible adrenal insufficiency, headache disorder and hypertension.  Patient was admitted with vertigo, fall, expressive dysphasia that ass concerning for TIA.    Patient was worked up extensively for TIA.  Patient will need 30-day Holter monitoring on discharge.  Following the fall, the patient was noted to have sustained humeral fracture on right arm.  Patient underwent open reduction and internal fixation.  Orthopedic and neurology input is highly appreciated.    Patient will be discharged to skilled nursing facility today.  Right humeral fracture (right humeral shaft fracture with significant displacement):  -Status post ORIF right humeral shaft fracture. -Continue to optimize pain control.   -Follow-up with orthopedic team within 1 to 2 weeks.  TIA -Presented with expressive aphasia, vertigo. 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 -2D echo 07/07/18 showed grade 1 diastolic dysfunction -LDL 126, continue Lipitor 40 mg daily -Hemoglobin A1c 7.9, continue aspirin 325 mg daily -Neurology input is appreciated.   -PT OT consult recommended skilled nursing facility for rehab -Follow up with Cardiology team for 30-day Holter  monitor.  Essential hypertension -BP currently stable, continue amlodipine, HCTZ, irbesartan, hydralazine  -Optimized  Type 2 diabetes mellitus, insulin-dependent, uncontrolled with hyperglycemia -Hemoglobin A1c 7.9 -Please monitor patient's blood sugar closely as patient is on significant amount of insulin.   Hypothyroidism -Continue Synthroid  Depression -Stable -Continue Lexapro 20 mg daily  Consults: orthopedic surgery, neurology.  Discharge Exam: Blood pressure (!) 119/59, pulse 71, temperature 98.3 F (36.8 C), temperature source Oral, resp. rate 18, height 5' 4.02" (1.626 m), weight 107.2 kg, SpO2 100 %.   Disposition: Discharge disposition: 03-Skilled Nursing Facility  Discharge Instructions    Diet - low sodium heart healthy   Complete by:  As directed    Diet Carb Modified   Complete by:  As directed    Increase activity slowly   Complete by:  As directed      Allergies as of 07/12/2018      Reactions   Invokana [canagliflozin] Other (See Comments)   Urinary incontinence   Percocet [oxycodone-acetaminophen] Nausea And Vomiting   Prilosec [omeprazole] Rash   Rogaine [minoxidil] Rash   Victoza [liraglutide] Itching      Medication List    STOP taking these medications   acetaminophen 500 MG tablet Commonly known as:  TYLENOL   BASAGLAR KWIKPEN 100 UNIT/ML Sopn Replaced by:  insulin glargine 100 UNIT/ML injection   Biotin 1 MG Caps   EXCEDRIN MIGRAINE PO   metFORMIN 500 MG 24 hr tablet Commonly known as:  GLUCOPHAGE-XR     TAKE these medications   albuterol 108 (90 Base) MCG/ACT inhaler Commonly known as:  PROVENTIL HFA;VENTOLIN HFA Inhale two puffs every four to six hours as needed for cough or wheeze. What changed:    how much to take  how to take this  when to take this  reasons to take this  additional instructions   amLODipine 5 MG tablet Commonly known as:  NORVASC Take 5 mg by mouth daily.   aspirin 325 MG  tablet Take 1 tablet (325 mg total) by mouth daily. Start taking on:  07/13/2018   atorvastatin 40 MG tablet Commonly known as:  LIPITOR Take 1 tablet (40 mg total) by mouth daily at 6 PM.   CENTRUM SILVER PO Take 1 tablet by mouth daily.   cetirizine 10 MG tablet Commonly known as:  ZYRTEC Take 10 mg by mouth daily.   docusate sodium 100 MG capsule Commonly known as:  COLACE Take 1 capsule (100 mg total) by mouth 2 (two) times daily.   EPINEPHrine 0.3 mg/0.3 mL [place] injection Commonly known as:  EPI-PEN Use as directed for life-threatening allergic reaction. What changed:    how much to take  how to take this  when to take this  additional instructions   escitalopram 20 MG tablet Commonly known as:  LEXAPRO Take 20 mg by mouth daily.   ferrous sulfate 325 (65 FE) MG tablet Take 365 mg by mouth daily.   folic acid 1 MG tablet Commonly known as:  FOLVITE Take 2 mg by mouth daily.   hydrALAZINE 25 MG tablet Commonly known as:  APRESOLINE Take 25 mg by mouth 2 (two) times daily.   HYDROcodone-acetaminophen 5-325 MG tablet Commonly known as:  NORCO/VICODIN Take 1 tablet by mouth every 4 (four) hours as needed for moderate pain.   ibuprofen 200 MG tablet Commonly known as:  ADVIL,MOTRIN Take 200 mg by mouth every 6 (six) hours as needed for moderate pain.   insulin aspart 100 UNIT/ML injection Commonly known as:  novoLOG Inject 0-15 Units into the skin 3 (three) times daily with meals.   insulin glargine 100 UNIT/ML injection Commonly known as:  LANTUS Inject 0.6 mLs (60 Units total) into the skin at bedtime. Replaces:  BASAGLAR KWIKPEN 100 UNIT/ML Sopn   irbesartan-hydrochlorothiazide 300-12.5 MG tablet Commonly known as:  AVALIDE Take 1 tablet by mouth daily.   JARDIANCE 25 MG Tabs tablet Generic drug:  empagliflozin Take 25 mg by mouth daily.   LORazepam 0.5 MG tablet Commonly known as:  ATIVAN Take 0.5 mg by mouth as needed for anxiety.    Magnesium 500 MG Tabs Take 500 mg by mouth daily.   metaxalone 800 MG tablet Commonly known as:  SKELAXIN Take 400 mg by mouth daily as needed for muscle spasms.   METHOTREXATE (PF) [place] Inject 0.8 Units into the skin once a week.   senna-docusate 8.6-50 MG tablet Commonly known as:  Senokot-S Take 1 tablet by mouth at bedtime as needed for mild constipation.   SIMPONI [place] Inject into the skin.   SYNTHROID 125 MCG tablet Generic drug:  levothyroxine Take 125 mcg by mouth daily before breakfast. BRAND ONLY   TRAVATAN Z 0.004 % Soln ophthalmic solution Generic drug:  Travoprost (BAK Free) Place 0.004 drops into both eyes daily.   TRULICITY 1.5 BP/1.0CH Sopn Generic drug:  Dulaglutide Inject 1.5 mg into the skin once a week.   vitamin B-12 100 MCG tablet Commonly known as:  CYANOCOBALAMIN Take 100 mcg by mouth daily.   Vitamin D (Ergocalciferol) 50000 units Caps capsule Commonly known as:  DRISDOL Take 1 capsule (50,000 Units total) by mouth every Monday. Start taking on:  07/14/2018   VITAMIN D-3 PO Take 50,000 Units by mouth every Monday.  Contact information for after-discharge care    [facility] .   Service:  Skilled Nursing Contact information: [place] [phone]             33 minutes spent discharging patient.  SignedBonnell Public 07/12/2018, 12:58 PM

## 2018-07-24 NOTE — Progress Notes (Signed)
Vials exp 07-26-19 

## 2018-07-25 ENCOUNTER — Telehealth: Payer: Self-pay | Admitting: Hematology and Oncology

## 2018-07-25 DIAGNOSIS — J3089 Other allergic rhinitis: Secondary | ICD-10-CM | POA: Diagnosis not present

## 2018-07-25 NOTE — Telephone Encounter (Signed)
LVM for pt regarding VM she left to cancel appts. Will call to r/s

## 2018-07-28 ENCOUNTER — Inpatient Hospital Stay: Payer: BC Managed Care – PPO

## 2018-07-29 ENCOUNTER — Encounter: Payer: Self-pay | Admitting: Occupational Therapy

## 2018-07-29 ENCOUNTER — Ambulatory Visit: Payer: BC Managed Care – PPO | Admitting: Occupational Therapy

## 2018-07-29 ENCOUNTER — Ambulatory Visit: Payer: BC Managed Care – PPO | Attending: Internal Medicine | Admitting: Physical Therapy

## 2018-07-29 ENCOUNTER — Inpatient Hospital Stay: Payer: BC Managed Care – PPO | Admitting: Hematology and Oncology

## 2018-07-29 ENCOUNTER — Other Ambulatory Visit: Payer: Self-pay

## 2018-07-29 ENCOUNTER — Encounter: Payer: Self-pay | Admitting: Physical Therapy

## 2018-07-29 DIAGNOSIS — R2681 Unsteadiness on feet: Secondary | ICD-10-CM

## 2018-07-29 DIAGNOSIS — M25611 Stiffness of right shoulder, not elsewhere classified: Secondary | ICD-10-CM | POA: Insufficient documentation

## 2018-07-29 DIAGNOSIS — M79621 Pain in right upper arm: Secondary | ICD-10-CM | POA: Diagnosis present

## 2018-07-29 DIAGNOSIS — Z9181 History of falling: Secondary | ICD-10-CM | POA: Diagnosis present

## 2018-07-29 DIAGNOSIS — R42 Dizziness and giddiness: Secondary | ICD-10-CM | POA: Diagnosis not present

## 2018-07-29 DIAGNOSIS — M6281 Muscle weakness (generalized): Secondary | ICD-10-CM | POA: Diagnosis present

## 2018-07-29 DIAGNOSIS — I69815 Cognitive social or emotional deficit following other cerebrovascular disease: Secondary | ICD-10-CM

## 2018-07-29 NOTE — Therapy (Signed)
[facility] [address] [place], [place], [address] Phone: [phone]   Fax:  [phone]  Physical Therapy Evaluation  Patient Details  Name: Jocelyn Kaufman MRN: [id number] Date of Birth: January 11, 1956 Referring Provider (PT): Dr. Shelia Media   Encounter Date: 07/29/2018  PT End of Session - 07/29/18 1421    Visit Number  1    Number of Visits  17    Date for PT Re-Evaluation  09/23/18    Authorization Type  BCBS    PT Start Time  2993    PT Stop Time  1402    PT Time Calculation (min)  49 min    Equipment Utilized During Treatment  Other (comment)   sling   Activity Tolerance  Patient tolerated treatment well    Behavior During Therapy  [facility] for tasks assessed/performed;Anxious;Flat affect       Past Medical History:  Diagnosis Date  . Adrenal adenoma   . 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 Depression   . Diabetes mellitus without complication ([place])   . Hyperlipidemia   . Hypertension   . Hypothyroidism   . Meralgia paresthetica of right side 01/10/2017  . Migraine   . Pericardial effusion   . 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 APPENDECTOMY    . CYST REMOVAL LEG Right    Removed from right thigh joint  . ORIF HUMERUS FRACTURE Right 07/09/2018   Procedure: OPEN REDUCTION INTERNAL FIXATION (ORIF) HUMERAL SHAFT FRACTURE;  Surgeon: Hiram Gash, MD;  Location: [facility];  Service: Orthopedics;  Laterality: Right;  . TONSILLECTOMY    . TOTAL VAGINAL HYSTERECTOMY    . UMBILICAL HERNIA REPAIR      There were no vitals filed for this visit.   Subjective Assessment - 07/29/18 1314    Subjective  Patient reporting fatigue today. pain at R arm is 4/10, muscle spasms are improving. Dizziness on 07/05/18 - was so severe that she went to bed at 5pm, on 9/8 - was still dizzy but with less intensity, fell with arm hitting washer - fracture. Had some difficulty speaking  following fall adn last 6 hours. Dizziness has persisted, room is not spinning, she is spinning, also feels like she is on a boat and falling to L. Dizziness is daily - 1 x today when turning around. Dizzy sensation is only a few seconds. wears glasses daily. No falls after.     Pertinent History  Cushings, DM, HTN, meralgia paresthetica, Migraine, pericardial effusion    Limitations  Standing;Walking    Patient Stated Goals  "fix the inner ear, reduce need for cane, feel comfortable with mobility"    Currently in Pain?  Yes    Pain Score  4     Pain Location  Arm    Pain Orientation  Right    Pain Descriptors / Indicators  Aching;Sore    Pain Type  Acute pain    Pain Onset  1 to 4 weeks ago    Pain Frequency  Intermittent         [facility] PT Assessment - 07/29/18 1324      Assessment   Medical Diagnosis  dizziness, fall    Referring Provider (PT)  Dr. Shelia Media    Onset Date/Surgical Date  07/06/18    Next MD Visit  --   ~08/22/18   Prior Therapy  Rehab      Precautions  Precautions  Fall;Shoulder    Shoulder Interventions  Shoulder sling/immobilizer    Required Braces or Orthoses  Sling   R UE     Restrictions   Weight Bearing Restrictions  Yes    RUE Weight Bearing  Non weight bearing      Balance Screen   Has the patient fallen in the past 6 months  Yes    How many times?  1    Has the patient had a decrease in activity level because of a fear of falling?   Yes    Is the patient reluctant to leave their home because of a fear of falling?   No      Home Environment   Living Environment  Private residence    Living Arrangements  Alone    Type of [place] to enter    Entrance Stairs-Number of Steps  3    Entrance Stairs-Rails  Can reach both    [place]  One level    [place] - single point      Prior Function   Level of [place]  Retired    Leisure  gardening, exericse (chair YOGA)       Cognition   Overall Cognitive Status  Within Functional Limits for tasks assessed      Posture/Postural Control   Posture/Postural Control  Postural limitations    Postural Limitations  Rounded Shoulders;Forward head;Posterior pelvic tilt;Weight shift left      Ambulation/Gait   Ambulation/Gait  Yes    Ambulation/Gait Assistance  6: Modified independent (Device/Increase time);5: Supervision    Ambulation/Gait Assistance Details  use of SPC; Supervision due to intermittent dizziness with gait and head turns/body turns    Ambulation Distance (Feet)  100 Feet    Assistive device  Straight cane    Gait Pattern  Step-to pattern;Step-through pattern;Decreased stride length;Trunk flexed   downward gaze   Ambulation Surface  Level;Indoor    Gait velocity  2.75 ft/sec    Stairs  Yes    Stairs Assistance  5: Supervision    Stairs Assistance Details (indicate cue type and reason)  supervision for safety    Stair Management Technique  One rail Left;Alternating pattern;Step to pattern   alternating going up, step to going down   Number of Stairs  4    Height of Stairs  6      Balance   Balance Assessed  Yes      Standardized Balance Assessment   Standardized Balance Assessment  Dynamic Gait Index   Romberg: EO: normal, EC: with LOB, need for support     Dynamic Gait Index   Level Surface  Mild Impairment    Change in Gait Speed  Moderate Impairment    Gait with Horizontal Head Turns  Moderate Impairment    Gait with Vertical Head Turns  Mild Impairment    Gait and Pivot Turn  Moderate Impairment    Step Over Obstacle  Moderate Impairment    Step Around Obstacles  Mild Impairment    Steps  Mild Impairment    Total Score  12    DGI comment:  12/24           Vestibular Assessment - 07/29/18 0001      Vestibular Assessment   General Observation  wears glasses all the time; "dizziness" off and on for last 10 years; reports she often wall  walks in her home      Symptom Behavior    Type of Dizziness  Spinning    Frequency of Dizziness  ~5 times a day    Duration of Dizziness  <10 seconds    Aggravating Factors  Forward bending;Turning body quickly;Turning head quickly;Looking up to the ceiling    Relieving Factors  Head stationary;Rest      Occulomotor Exam   Occulomotor Alignment  Normal    Spontaneous  Absent    Gaze-induced  Absent   with L gaze subjective of L eye "jumping"   Head shaking Horizontal  Absent   "dizzy"   Smooth Pursuits  Intact   "a little bit dizzy"   Saccades  Intact   felt like she was "cross-eyed"     Vestibulo-Occular Reflex   Comment  Head Impulse Testing: corrective saccades bilaterally.           Objective measurements completed on examination: See above findings.              PT Education - 07/29/18 1420    Education Details  exam findings, justification of skilled treatment, fall risk, vestibular PT, initial HEP for VOR x 1    Person(s) Educated  Patient    Methods  Explanation;Demonstration;Handout    Comprehension  Verbalized understanding       PT Short Term Goals - 07/29/18 1431      PT SHORT TERM GOAL #1   Title  patient to be independent with HEP for vestibular and balance activities    Time  4    Period  Weeks    Status  New    Target Date  08/26/18      PT SHORT TERM GOAL #2   Title  patient to improve DGI to >/= 15/24    Baseline  12/24    Time  4    Period  Weeks    Status  New    Target Date  08/26/18      PT SHORT TERM GOAL #3   Title  patient to improve gait speed to >/= 3.5 ft/sec with LRAD at Mod I level over level indoor/outdoor surfaces    Baseline  2.7 ft/sec    Time  4    Period  Weeks    Status  New    Target Date  08/26/18        PT Long Term Goals - 07/29/18 1433      PT LONG TERM GOAL #1   Title  patient to be independent with advanced HEP     Time  8    Period  Weeks    Status  New    Target Date  09/23/18      PT LONG TERM GOAL #2   Title  patient to  improve gait speed to >/= 4.37 ft/sec with LRAD at IND over various indoor and outdoor surfaces    Time  8    Period  Weeks    Status  New    Target Date  09/23/18      PT LONG TERM GOAL #3   Title  patient to improve DGI to >/= 19/24 demonstrating reduced fall risk    Time  8    Period  Weeks    Status  New    Target Date  09/23/18      PT LONG TERM GOAL #4   Title  patient to report reduction in symptom frequency, duration, intensity by >/=  50%    Time  8    Period  Weeks    Status  New    Target Date  09/23/18      PT LONG TERM GOAL #5   Title  BERG/SOT/FGA assessment and goal as indicated    Time  8    Period  Weeks    Status  New    Target Date  09/23/18             Plan - 07/29/18 1422    Clinical Impression Statement  Jocelyn Kaufman is a very pleasant 62 y/o female presenting to [facility] today regarding primary complaints of dizziness and gait instability with noted fall on 07/06/18 with resultant  R humeral spiral fracture requiring ORIF. Patient with reports of dizziness "on and off for 10 years" with feeling as if she is spinning, on a boat or falling to the L. Oculomotor exam normal, however with subjective reports of mild dizziness. Vestibular testing sans positional testing due to R shoulder fracture correlating most with motion sensitivity vs hypofunction. With balance testing patient also in high fall risk per DGI with score of 12/24 and with gait speed of 2.7 ft/sec reduced from that of a "normal walking speed." Today given VOR x1 with good tolerance and understanding. Patient to benefit form skilled PT to address dizziness, gait, balance, and overall fall risk.    History and Personal Factors relevant to plan of care:  Cushings, DM, HTN, meralgia paresthetica, Migraine, pericardial effusion    Clinical Presentation  Evolving    Clinical Presentation due to:  Cushings, DM, HTN, meralgia paresthetica, Migraine, pericardial effusion, use of cane, fear of falling, reduction in  activity, R UE humeral fracture with sling    Clinical Decision Making  Moderate    Rehab Potential  Good    PT Frequency  2x / week    PT Duration  8 weeks    PT Treatment/Interventions  ADLs/Self Care Home Management;Canalith Repostioning;DME Instruction;Balance training;Therapeutic exercise;Therapeutic activities;Functional mobility training;Stair training;Gait training;Neuromuscular re-education;Patient/family education;Manual techniques;Passive range of motion;Vestibular;Visual/perceptual remediation/compensation    PT Next Visit Plan  begin to progress VOR, balance, gait. may need to test SOT or Berg    PT [place] and Agree with Plan of Care  Patient       Patient will benefit from skilled therapeutic intervention in order to improve the following deficits and impairments:  Decreased activity tolerance, Dizziness, Pain, Postural dysfunction, Decreased strength, Decreased balance  Visit Diagnosis: Dizziness and giddiness  Unsteadiness on feet  History of falling     Problem List Patient Active Problem List   Diagnosis Date Noted  . Fall at home, initial encounter 07/06/2018  . Displaced spiral fracture of shaft of humerus, right arm, initial encounter for closed fracture 07/06/2018  . TIA (transient ischemic attack) 07/06/2018  . Allergic rhinitis 07/04/2018  . Normochromic normocytic anemia 01/20/2018  . Rheumatoid arthritis ([place]) 04/30/2017  . Lesion of left humerus 04/29/2017  . Pericardial effusion 03/14/2017  . Diabetes mellitus ([place]) 03/14/2017  . Essential hypertension 03/14/2017  . Mixed hyperlipidemia 03/14/2017  . 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 Meralgia paresthetica of right side 01/10/2017  . Allergic rhinoconjunctivitis 07/09/2015  . Asthma, well controlled 07/09/2015  . Laryngopharyngeal reflux (LPR) 07/09/2015    Lanney Gins, PT, DPT Supplemental Physical Therapist 07/29/18 2:51 PM Pager:  [phone] Office: [facility] [place] [address] [place]  [place], [place], [address] Phone: [phone]   Fax:  [phone]  Name: Jocelyn Kaufman MRN: [id number] Date of Birth: [birth year]

## 2018-07-30 ENCOUNTER — Other Ambulatory Visit: Payer: Self-pay

## 2018-07-30 ENCOUNTER — Encounter: Payer: Self-pay | Admitting: Occupational Therapy

## 2018-07-30 NOTE — Therapy (Signed)
[facility] [address] [place], [place], [address] Phone: [phone]   Fax:  [phone]  Occupational Therapy Evaluation  Patient Details  Name: Jocelyn Kaufman MRN: [id number] Date of Birth: [birth year] Referring Provider (OT): Deland Pretty   Encounter Date: 07/29/2018  OT End of Session - 07/30/18 0823    Visit Number  1    Number of Visits  21    Date for OT Re-Evaluation  09/28/18    Authorization Type  VL=MN    OT Start Time  1445    OT Stop Time  1540    OT Time Calculation (min)  55 min    Behavior During Therapy  Flat affect       Past Medical History:  Diagnosis Date  . Adrenal adenoma   . 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 Depression   . Diabetes mellitus without complication ([place] [facility])   . Hyperlipidemia   . Hypertension   . Hypothyroidism   . Meralgia paresthetica of right side 01/10/2017  . Migraine   . Pericardial effusion   . 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 APPENDECTOMY    . CYST REMOVAL LEG Right    Removed from right thigh joint  . ORIF HUMERUS FRACTURE Right 07/09/2018   Procedure: OPEN REDUCTION INTERNAL FIXATION (ORIF) HUMERAL SHAFT FRACTURE;  Surgeon: Hiram Gash, MD;  Location: [facility];  Service: Orthopedics;  Laterality: Right;  . TONSILLECTOMY    . TOTAL VAGINAL HYSTERECTOMY    . UMBILICAL HERNIA REPAIR      There were no vitals filed for this visit.  Subjective Assessment - 07/30/18 0803    Subjective   Prior to fall patient reports attending chair yoga, coloring class, jewelery making class, and honeybee helpers    Currently in Pain?  Yes    Pain Score  4     Pain Location  Arm    Pain Orientation  Right    Pain Descriptors / Indicators  Aching;Sore;Spasm    Pain Type  Acute pain    Pain Onset  1 to 4 weeks ago    Pain Frequency  Intermittent    Aggravating Factors   fast movement    Pain Relieving Factors  rest,  medication        [facility] OT Assessment - 07/30/18 0001      Assessment   Referring Provider (OT)  Deland Pretty    Onset Date/Surgical Date  07/09/18    Hand Dominance  Right    Next MD Visit  08/22/2018    Prior Therapy  Rehab      Precautions   Precautions  Shoulder;Fall    Type of Shoulder Precautions  NWB, PROM    Shoulder Interventions  Shoulder sling/immobilizer    Required Braces or Orthoses  Sling      Restrictions   Weight Bearing Restrictions  Yes    RUE Weight Bearing  Non weight bearing      Balance Screen   Has the patient fallen in the past 6 months  Yes    How many times?  1    Has the patient had a decrease in activity level because of a fear of falling?   Yes      Prior Function   Level of Independence  Independent;Independent with basic ADLs;Independent with homemaking with ambulation    Vocation  Retired    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, exercise class, gardening      ADL   Eating/Feeding  Modified independent    Grooming  Modified independent    Upper Body Bathing  Modified independent    Lower Body Bathing  Modified independent    Upper Body Dressing  Increased time    Lower Body Dressing  Increased time;Modified independent    Armed forces technical officer  Modified independent    Toileting - Clothing Manipulation  Modified independent    Tub/Shower Transfer  Modified independent    Equipment Used  Cane    ADL comments  Using predominantly left non dominant hand      IADL   Prior Level of Function Shopping  Independent    Prior Level of Function Light Housekeeping  Independent    Light Housekeeping  Performs light daily tasks such as dishwashing, bed making    Prior Level of Function Meal Prep  Independent    Prior Level of Function Barrister's clerk Expression   Dominant Hand  Right      Vision Assessment   Eye Alignment  Within Functional Limits      Cognition   Cognition Comments   Patient reports diagnosis of TIA, and some difficulty with word finding - will continue to monitor cognitive status      Observation/Other Assessments   Skin Integrity  Steri strips over incision    Focus on Therapeutic Outcomes (FOTO)   NA      Posture/Postural Control   Posture/Postural Control  Postural limitations    Postural Limitations  Weight shift left;Posterior pelvic tilt;Rounded Shoulders;Forward head      Sensation   Light Touch  Not tested      Coordination   Gross Motor Movements are Fluid and Coordinated  No    Fine Motor Movements are Fluid and Coordinated  No    Coordination and Movement Description  Patient with AROM restrictions in RUE - Unable to use right hand functionally as she has difficulty placing it in position for function.        Praxis   Praxis  Not tested      ROM / Strength   AROM / PROM / Strength  PROM      PROM   Overall PROM   Deficits;Due to precautions;Due to pain    Overall PROM Comments  Patient in high guard position, pain 4-5/10 at rest    PROM Assessment Site  Elbow;Forearm;Shoulder    Right/Left Shoulder  Right    Right Shoulder Extension  15 Degrees    Right Shoulder Flexion  75 Degrees    Right Shoulder Internal Rotation  45 Degrees   humeral dependent position   Right Shoulder External Rotation  30 Degrees   humeral dependent position   Right Elbow Flexion  -30    Right Forearm Supination  -15 Degrees      Hand Function   Right Hand Gross Grasp  Impaired    Right Hand Grip (lbs)  35    Right Hand Lateral Pinch  11 lbs    Left Hand Gross Grasp  Functional    Left Hand Grip (lbs)  48    Left Hand Lateral Pinch  9 lbs    Comment  Osteo and rheumatoid arthritis                      OT Education - 07/30/18  [place]    Education Details  Reviewed precautions regarding NWB status, reveiwed potential goals of OT and plan of care    Person(s) Educated  Patient    Methods  Explanation    Comprehension  Verbalized  understanding       OT Short Term Goals - 07/30/18 0841      OT SHORT TERM GOAL #1   Title  Patient will complete an HEP designed to increase elbow through hand AROM DUE 08/29/2018    Time  4    Period  Weeks    Status  New    Target Date  08/29/18      OT SHORT TERM GOAL #2   Title  Patient will demonstrate knowledge and effective technique for scar mobilization and management    Time  4    Period  Weeks    Status  New      OT SHORT TERM GOAL #3   Title  Patient will demonstrate understanding of safe transition out of sling schedule    Time  4    Period  Weeks    Status  New      OT SHORT TERM GOAL #4   Title  Patient will complete HEP designed for self ROM for right shoulder    Time  4    Period  Weeks    Status  New      OT SHORT TERM GOAL #5   Title  Patient will incorporate right hand into daily functional tasks as able, incorporating elbow, flroearm, wrist and hand motion, e.g. writing, feeding, oral hygiene, aspects of bathing, dresisng    Time  4    Period  Weeks    Status  New        OT Long Term Goals - 07/30/18 0847      OT LONG TERM GOAL #1   Title  Patient will complete update HEP designed to improve AROM RUE DUE 09/28/18    Time  8    Period  Weeks    Status  New    Target Date  09/28/18      OT LONG TERM GOAL #2   Title  Patient will demonstrate adequate shoulder range of motion and strength to obtain / place a lightweight (LESS THAN 2LB) object on chest height shelf with RUE    Time  8    Period  Weeks    Status  New      OT LONG TERM GOAL #3   Title  Patient will demonstrate adequate strength and range of motion to use a curling iron for hair care - one hand for comb, one hand for iron    Time  8    Period  Weeks    Status  New      OT LONG TERM GOAL #4   Title  Patient will demonstrate adequate strength and range of motion to apply and remove seat belt, includes buckle (driver's side or passenger side) without report of increased pain       OT LONG TERM GOAL #5   Title  Patient will demonstrate adequate range of motion and consistent pressure to use BUE's to guide material through sewing machine    Time  8    Period  Weeks    Status  New            Plan - 07/30/18 0825    Clinical Impression Statement  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  Patient had ORIF to stabilize humerus on 07/09/18, and was then sent for short term placement to SNF for rehab.  Patient is now home, living alone and attending OP rehab OT/PT.  Patient presents during OT evaluation with significant pain right arm (surgical site), decreased balance with mobility, very limited / guaded movement repetoire - limited head turns/nods, slow cautious movement, restrictions for range of motion and weight bearing through RUE, difficulty with word finding-  all which impede her safe and efficient performance of ADL/IADL. Patient will benefit from OP OT to address right arm recovery post ORIF to regain range of motion, strength and functional use of dominant arm, balance during ADL.Adele Dan, AND to return to leisure interests of crafts and community exercise.      Occupational Profile and client history currently impacting functional performance  retired Pharmacist, hospital, Psychologist, occupational, enjoys chair yoga, Radiographer, therapeutic    Occupational performance deficits (Please refer to evaluation for details):  ADL's;IADL's;Rest and Sleep;Leisure;Social Participation    Rehab Potential  Good    Current Impairments/barriers affecting progress:  pain, fear, need to establish trust     OT Frequency  3x / week    OT Duration  4 weeks   then 2x/week for 4 weeks   OT Treatment/Interventions  Self-care/ADL training;Therapeutic exercise;Functional Mobility Training;Balance training;Aquatic Therapy;Ultrasound;Neuromuscular  education;Manual Therapy;Splinting;Energy conservation;Therapeutic activities;Coping strategies training;DME and/or AE instruction;Cognitive remediation/compensation;Electrical Stimulation;Scar mobilization;Moist Heat;Cryotherapy;Passive range of motion;Patient/family education    Plan  Need to initiate HEP - elbow, wrist, forearm hand AROM, Scapular range of motion (elevation / depression, etc), gentle PROM to shoulder supine    Clinical Decision Making  Several treatment options, min-mod task modification necessary    Consulted and Agree with Plan of Care  Patient       Patient will benefit from skilled therapeutic intervention in order to improve the following deficits and impairments:  Decreased endurance, Decreased skin integrity, Increased muscle spasms, Decreased coping skills, Decreased knowledge of precautions, Decreased scar mobility, Impaired perceived functional ability, Improper body mechanics, Decreased activity tolerance, Decreased knowledge of use of DME, Decreased strength, Impaired flexibility, Decreased balance, Decreased mobility, Difficulty walking, Decreased cognition, Decreased range of motion, Increased edema, Pain, Decreased coordination, Decreased safety awareness, Increased fascial restrictions, Impaired UE functional use  Visit Diagnosis: Stiffness of right shoulder, not elsewhere classified - Plan: Ot plan of care cert/re-cert  Pain in right upper arm - Plan: Ot plan of care cert/re-cert  Unsteadiness on feet - Plan: Ot plan of care cert/re-cert  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 Fall at home, initial encounter 07/06/2018  . Displaced spiral fracture of shaft of humerus, right arm, initial encounter for closed fracture 07/06/2018  . TIA  (transient ischemic attack) 07/06/2018  . Allergic rhinitis 07/04/2018  . Normochromic normocytic anemia 01/20/2018  . Rheumatoid arthritis ([facility]) 04/30/2017  . Lesion of left humerus 04/29/2017  . Pericardial effusion 03/14/2017  . Diabetes mellitus ([facility]) 03/14/2017  . Essential hypertension 03/14/2017  . Mixed hyperlipidemia 03/14/2017  . 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 Meralgia paresthetica of right side 01/10/2017  . Allergic rhinoconjunctivitis 07/09/2015  . Asthma, well controlled 07/09/2015  . Laryngopharyngeal  reflux (LPR) 07/09/2015    Mariah Milling, OTR/L 07/30/2018, 8:56 AM  [facility] [address] [place], [place], [address] Phone: [phone]   Fax:  [phone]  Name: RODERICK SWEEZY MRN: [id number] Date of Birth: 06-Jun-1956

## 2018-07-31 ENCOUNTER — Ambulatory Visit: Payer: BC Managed Care – PPO | Admitting: Occupational Therapy

## 2018-07-31 DIAGNOSIS — M25611 Stiffness of right shoulder, not elsewhere classified: Secondary | ICD-10-CM

## 2018-07-31 DIAGNOSIS — M6281 Muscle weakness (generalized): Secondary | ICD-10-CM

## 2018-07-31 DIAGNOSIS — M79621 Pain in right upper arm: Secondary | ICD-10-CM

## 2018-07-31 DIAGNOSIS — R42 Dizziness and giddiness: Secondary | ICD-10-CM | POA: Diagnosis not present

## 2018-07-31 NOTE — Patient Instructions (Signed)
Elbow: Flexion   Use other hand to bend elbow with thumb toward the same shoulder. Do NOT force this motion. Hold _10___ seconds. Repeat _10__ times. Do _4-6___ sessions per day. CAUTION: Movement should be gentle, steady and slow.        SHOULDER: Flexion On Table    Place hands on table, help right arm with left arm straight en elbows sliding towel forwards and backwards.Hold _3__ seconds. __10_ reps per set, 4___ sets per day,   Copyright  VHI. All rights reserved.     AROM: Forearm Pronation / Supination   With _right___ arm in handshake position, slowly rotate palm down until stretch is felt. Relax. Then rotate palm up until stretch is felt. Repeat _15___ times per set. Do _4-6___ sessions per day.  Copyright  VHI. All rights reserved.

## 2018-07-31 NOTE — Therapy (Signed)
[facility] [address] [place], [place], [address] Phone: [phone]   Fax:  [phone]  Occupational Therapy Treatment  Patient Details  Name: Jocelyn Kaufman MRN: [id number] Date of Birth: 03-30-1956 Referring Provider (OT): Deland Pretty   Encounter Date: 07/31/2018  OT End of Session - 07/31/18 1547    Visit Number  2    Number of Visits  21    Date for OT Re-Evaluation  09/28/18    Authorization Type  [employer], VL=[place]    OT Start Time  1321    OT Stop Time  1405    OT Time Calculation (min)  44 min    Activity Tolerance  Patient tolerated treatment well    Behavior During Therapy  Flat affect       Past Medical History:  Diagnosis Date  . Adrenal adenoma   . 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 Depression   . Diabetes mellitus without complication ([facility])   . Hyperlipidemia   . Hypertension   . Hypothyroidism   . Meralgia paresthetica of right side 01/10/2017  . Migraine   . Pericardial effusion   . 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 APPENDECTOMY    . CYST REMOVAL LEG Right    Removed from right thigh joint  . ORIF HUMERUS FRACTURE Right 07/09/2018   Procedure: OPEN REDUCTION INTERNAL FIXATION (ORIF) HUMERAL SHAFT FRACTURE;  Surgeon: Hiram Gash, MD;  Location: [facility];  Service: Orthopedics;  Laterality: Right;  . TONSILLECTOMY    . TOTAL VAGINAL HYSTERECTOMY    . UMBILICAL HERNIA REPAIR      There were no vitals filed for this visit.  Subjective Assessment - 07/31/18 1545    Subjective   Pt reports pain in right arm    Currently in Pain?  Yes    Pain Score  4     Pain Location  Arm    Pain Orientation  Right    Pain Descriptors / Indicators  Aching    Pain Type  Acute pain    Pain Onset  1 to 4 weeks ago    Pain Frequency  Intermittent    Aggravating Factors   moving arm    Pain Relieving Factors  rest, meds    Multiple Pain Sites  No               Treatment: hotpack applied to right shoulder x 10 mins while therapist reviewed goals with pt. Pt is in agreement. Supine pt performed gentle scapular retraction followed by self ROM AA/ROM/P/ROM shoulder flexion to grossly 85* P/ROM shoulder abduction to 75* Pt performed P/ROM/ AA/ROM elbow flexion/ extension in seated and forearm supination and pronation. Pt was instruction ins seated P/ROM shoulder flexion along tabletop with LUE assisting RUE on towel. Pt returned demonstration, Pt reports driving to therapy today. Therapist recommends pt removes sling if she is driving for safety.                OT Short Term Goals - 07/30/18 0841      OT SHORT TERM GOAL #1   Title  Patient will complete an HEP designed to increase elbow through hand AROM DUE 08/29/2018    Time  4    Period  Weeks    Status  New    Target Date  08/29/18      OT SHORT TERM GOAL #2   Title  Patient will demonstrate knowledge and effective technique for scar mobilization and management    Time  4    Period  Weeks    Status  New      OT SHORT TERM GOAL #3   Title  Patient will demonstrate understanding of safe transition out of sling schedule    Time  4    Period  Weeks    Status  New      OT SHORT TERM GOAL #4   Title  Patient will complete HEP designed for self ROM for right shoulder    Time  4    Period  Weeks    Status  New      OT SHORT TERM GOAL #5   Title  Patient will incorporate right hand into daily functional tasks as able, incorporating elbow, flroearm, wrist and hand motion, e.g. writing, feeding, oral hygiene, aspects of bathing, dresisng    Time  4    Period  Weeks    Status  New        OT Long Term Goals - 07/30/18 0847      OT LONG TERM GOAL #1   Title  Patient will complete update HEP designed to improve AROM RUE DUE 09/28/18    Time  8    Period  Weeks    Status  New    Target Date  09/28/18      OT LONG TERM GOAL #2   Title  Patient will  demonstrate adequate shoulder range of motion and strength to obtain / place a lightweight (LESS THAN 2LB) object on chest height shelf with RUE    Time  8    Period  Weeks    Status  New      OT LONG TERM GOAL #3   Title  Patient will demonstrate adequate strength and range of motion to use a curling iron for hair care - one hand for comb, one hand for iron    Time  8    Period  Weeks    Status  New      OT LONG TERM GOAL #4   Title  Patient will demonstrate adequate strength and range of motion to apply and remove seat belt, includes buckle (driver's side or passenger side) without report of increased pain      OT LONG TERM GOAL #5   Title  Patient will demonstrate adequate range of motion and consistent pressure to use BUE's to guide material through sewing machine    Time  8    Period  Weeks    Status  New            Plan - 07/31/18 1551    Clinical Impression Statement  Pt is progressing towards goals. She demonstrates progress towards goals with improving ROM.    Occupational Profile and client history currently impacting functional performance  retired Pharmacist, hospital, Psychologist, occupational, enjoys chair yoga, Radiographer, therapeutic    Occupational performance deficits (Please refer to evaluation for details):  ADL's;IADL's;Rest and Sleep;Leisure;Social Participation    Rehab Potential  Good    Current Impairments/barriers affecting progress:  pain, fear, need to establish trust     OT Frequency  3x / week    OT Duration  4 weeks   followed by 2x week for 4 weeks   OT Treatment/Interventions  Self-care/ADL training;Therapeutic exercise;Functional Mobility Training;Balance training;Aquatic Therapy;Ultrasound;Neuromuscular education;Manual Therapy;Splinting;Energy conservation;Therapeutic activities;Coping strategies training;DME and/or AE instruction;Cognitive remediation/compensation;Electrical Stimulation;Scar mobilization;Moist Heat;Cryotherapy;Passive range of motion;Patient/family education  Plan  continue gentle ROM await clarifiaction from MD     Consulted and Agree with Plan of Care  Patient       Patient will benefit from skilled therapeutic intervention in order to improve the following deficits and impairments:     Visit Diagnosis: Stiffness of right shoulder, not elsewhere classified  Pain in right upper arm  Muscle weakness (generalized)    Problem List Patient Active Problem List   Diagnosis Date Noted  . Fall at home, initial encounter 07/06/2018  . Displaced spiral fracture of shaft of humerus, right arm, initial encounter for closed fracture 07/06/2018  . TIA (transient ischemic attack) 07/06/2018  . Allergic rhinitis 07/04/2018  . Normochromic normocytic anemia 01/20/2018  . Rheumatoid arthritis ([facility]) 04/30/2017  . Lesion of left humerus 04/29/2017  . Pericardial effusion 03/14/2017  . Diabetes mellitus ([facility]) 03/14/2017  . Essential hypertension 03/14/2017  . Mixed hyperlipidemia 03/14/2017  . 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 Meralgia paresthetica of right side 01/10/2017  . Allergic rhinoconjunctivitis 07/09/2015  . Asthma, well controlled 07/09/2015  . Laryngopharyngeal reflux (LPR) 07/09/2015    RINE,KATHRYN 07/31/2018, 3:55 PM  [facility] [address] [place], [place], [address] Phone: [phone]   Fax:  [phone]  Name: MARDEE CLUNE MRN: [id number] Date of Birth: [birth year]

## 2018-08-01 ENCOUNTER — Encounter: Payer: Self-pay | Admitting: Physical Therapy

## 2018-08-01 ENCOUNTER — Ambulatory Visit: Payer: BC Managed Care – PPO | Admitting: Physical Therapy

## 2018-08-01 DIAGNOSIS — R42 Dizziness and giddiness: Secondary | ICD-10-CM | POA: Diagnosis not present

## 2018-08-01 DIAGNOSIS — R2681 Unsteadiness on feet: Secondary | ICD-10-CM

## 2018-08-01 DIAGNOSIS — Z9181 History of falling: Secondary | ICD-10-CM

## 2018-08-01 NOTE — Therapy (Addendum)
[facility] [address] [place], [place], [address] Phone: [phone]   Fax:  [phone]  Physical Therapy Treatment  Patient Details  Name: Jocelyn Kaufman MRN: [id number] Date of Birth: May 20, 1956 Referring Provider (PT): Dr. Shelia Media   Encounter Date: 08/01/2018  PT End of Session - 08/01/18 0805    Visit Number  2    Number of Visits  17    Date for PT Re-Evaluation  09/23/18    Authorization Type  BCBS    PT Start Time  0801    PT Stop Time  0845    PT Time Calculation (min)  44 min    Equipment Utilized During Treatment  --   min guard to supervision   Activity Tolerance  Patient tolerated treatment well    Behavior During Therapy  Flat affect       Past Medical History:  Diagnosis Date  . Adrenal adenoma   . Cushing disease ([place])   . Depression   . Diabetes mellitus without complication ([place])   . Hyperlipidemia   . Hypertension   . Hypothyroidism   . Meralgia paresthetica of right side 01/10/2017  . Migraine   . Pericardial effusion   . 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 APPENDECTOMY    . CYST REMOVAL LEG Right    Removed from right thigh joint  . ORIF HUMERUS FRACTURE Right 07/09/2018   Procedure: OPEN REDUCTION INTERNAL FIXATION (ORIF) HUMERAL SHAFT FRACTURE;  Surgeon: Hiram Gash, MD;  Location: [facility];  Service: Orthopedics;  Laterality: Right;  . TONSILLECTOMY    . TOTAL VAGINAL HYSTERECTOMY    . UMBILICAL HERNIA REPAIR      There were no vitals filed for this visit.  Subjective Assessment - 08/01/18 0804    Subjective  has been coming out of sling some without issue. No falls. VOR lateral is difficult    Pertinent History  Cushings, DM, HTN, meralgia paresthetica, Migraine, pericardial effusion    Patient Stated Goals  "fix the inner ear, reduce need for cane, feel comfortable with mobility"    Currently in Pain?  Yes     Pain Score  3     Pain Location  Arm    Pain Orientation  Right    Pain Descriptors / Indicators  Aching;Sore    Pain Type  Acute pain                       [facility] Adult PT Treatment/Exercise - 08/01/18 0808      Exercises   Exercises  Neck      Neck Exercises: Seated   Cervical Rotation  Both;10 reps   slight dizziness   Lateral Flexion  Both;10 reps   slight dizziness   Other Seated Exercise  scap retraction 10 x 3 sec holds      Vestibular Treatment/Exercise - 08/01/18 0820      Vestibular Treatment/Exercise   Vestibular Treatment Provided  Gaze    Gaze Exercises  X1 Viewing Horizontal;X1 Viewing Vertical      X1 Viewing Horizontal   Foot Position  seated, standing feet apart    Time  --   15-30 sec   Reps  2      X1 Viewing Vertical   Foot Position  seated    Time  --   30 sec   Reps  2  Exercises  Neck Rotation - 10 reps - 2 sets - 1x daily - 7x weekly  Neck Sidebending - 10 reps - 2 sets - 1x daily - 7x weekly  Sit to Stand with Arms Crossed - 10 reps - 2 sets - 1x daily - 7x weekly  Standing Marching - 10 reps - 2 sets - 1x daily - 7x weekly  Standing Hip Abduction with Counter Support - 10 reps - 2 sets - 1x daily - 7x weekly    Balance Exercises - 08/01/18 0828      Balance Exercises: Standing   Marching Limitations  alternating high knee marching x 10 each LE - reduced height with R LE - 1 UE support    Sit to Stand Time  10 reps - no UE support - from mat table     Other Standing Exercises  hip abduction x 10 each LE - 1 UE support - difficulty with R LE           PT Short Term Goals - 07/29/18 1431      PT SHORT TERM GOAL #1   Title  patient to be independent with HEP for vestibular and balance activities    Time  4    Period  Weeks    Status  New    Target Date  08/26/18      PT SHORT TERM GOAL #2   Title  patient to improve DGI to >/= 15/24    Baseline  12/24    Time  4    Period  Weeks    Status  New     Target Date  08/26/18      PT SHORT TERM GOAL #3   Title  patient to improve gait speed to >/= 3.5 ft/sec with LRAD at Mod I level over level indoor/outdoor surfaces    Baseline  2.7 ft/sec    Time  4    Period  Weeks    Status  New    Target Date  08/26/18        PT Long Term Goals - 07/29/18 1433      PT LONG TERM GOAL #1   Title  patient to be independent with advanced HEP     Time  8    Period  Weeks    Status  New    Target Date  09/23/18      PT LONG TERM GOAL #2   Title  patient to improve gait speed to >/= 4.37 ft/sec with LRAD at IND over various indoor and outdoor surfaces    Time  8    Period  Weeks    Status  New    Target Date  09/23/18      PT LONG TERM GOAL #3   Title  patient to improve DGI to >/= 19/24 demonstrating reduced fall risk    Time  8    Period  Weeks    Status  New    Target Date  09/23/18      PT LONG TERM GOAL #4   Title  patient to report reduction in symptom frequency, duration, intensity by >/= 50%    Time  8    Period  Weeks    Status  New    Target Date  09/23/18      PT LONG TERM GOAL #5   Title  BERG/SOT/FGA assessment and goal as indicated    Time  8    Period  Weeks    Status  New    Target Date  09/23/18            Plan - 08/01/18 0806    Clinical Impression Statement  Trevor reporting symptom provocation with horizontal VOR x 1 at home. Today attmepting to progress task into standing with patient only tolerable to ~15 seconds of activity before she needs to come to a sitting position. HEP initiated to promote cervical motion as well as LE strengthening/balance with good tolerance and understanding. Noted difficulty with R LE. WIll continue to progress balance, gait, VOR.     Rehab Potential  Good    PT Frequency  2x / week    PT Duration  8 weeks    PT Treatment/Interventions  ADLs/Self Care Home Management;Canalith Repostioning;DME Instruction;Balance training;Therapeutic exercise;Therapeutic activities;Functional  mobility training;Stair training;Gait training;Neuromuscular re-education;Patient/family education;Manual techniques;Passive range of motion;Vestibular;Visual/perceptual remediation/compensation    PT Next Visit Plan  begin to progress VOR, balance, gait. may need to test SOT or Berg    PT Home Exercise Plan  [id number]    Consulted and Agree with Plan of Care  Patient       Patient will benefit from skilled therapeutic intervention in order to improve the following deficits and impairments:  Decreased activity tolerance, Dizziness, Pain, Postural dysfunction, Decreased strength, Decreased balance  Visit Diagnosis: Dizziness and giddiness  Unsteadiness on feet  History of falling     Problem List Patient Active Problem List   Diagnosis Date Noted  . Fall at home, initial encounter 07/06/2018  . Displaced spiral fracture of shaft of humerus, right arm, initial encounter for closed fracture 07/06/2018  . TIA (transient ischemic attack) 07/06/2018  . Allergic rhinitis 07/04/2018  . Normochromic normocytic anemia 01/20/2018  . Rheumatoid arthritis ([facility]) 04/30/2017  . Lesion of left humerus 04/29/2017  . Pericardial effusion 03/14/2017  . Diabetes mellitus ([place]) 03/14/2017  . Essential hypertension 03/14/2017  . Mixed hyperlipidemia 03/14/2017  . 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 Meralgia paresthetica of right side 01/10/2017  . Allergic rhinoconjunctivitis 07/09/2015  . Asthma, well controlled 07/09/2015  . Laryngopharyngeal reflux (LPR) 07/09/2015     Lanney Gins, PT, DPT Supplemental Physical Therapist 08/01/18 11:46 AM Pager: [phone] Office: [facility] [address] [place], [place], [address] Phone: [phone]   Fax:  [phone]  Name: JALA DUNDON MRN: [id number] Date of Birth: 10-Nov-1955

## 2018-08-04 ENCOUNTER — Encounter: Payer: Self-pay | Admitting: Physical Therapy

## 2018-08-04 ENCOUNTER — Ambulatory Visit: Payer: BC Managed Care – PPO | Admitting: Physical Therapy

## 2018-08-04 DIAGNOSIS — R2681 Unsteadiness on feet: Secondary | ICD-10-CM

## 2018-08-04 DIAGNOSIS — R42 Dizziness and giddiness: Secondary | ICD-10-CM

## 2018-08-04 NOTE — Patient Instructions (Addendum)
Access Code: [email or web address]  URL: [email or web address]  Date: 08/04/2018  Prepared by: Barry Brunner   Exercises  Seated Gaze Stabilization with Head Nod - 2 reps - 30-60 hold - 2-3x daily - 7x weekly  Standing Single Leg Stance with Counter Support - 3 reps - 1 sets - 10 seconds hold - 2x daily - 5x weekly

## 2018-08-04 NOTE — Therapy (Signed)
[facility] [address] [place], [place], [address] Phone: [phone]   Fax:  [phone]  Physical Therapy Treatment  Patient Details  Name: Jocelyn Kaufman MRN: [id number] Date of Birth: 01-Sep-1956 Referring Provider (PT): Dr. Shelia Media   Encounter Date: 08/04/2018  PT End of Session - 08/04/18 1515    Visit Number  3    Number of Visits  17    Date for PT Re-Evaluation  09/23/18    Authorization Type  BCBS    PT Start Time  1405    PT Stop Time  1450    PT Time Calculation (min)  45 min    Equipment Utilized During Treatment  --   min guard to supervision   Activity Tolerance  Patient tolerated treatment well    Behavior During Therapy  Flat affect       Past Medical History:  Diagnosis Date  . Adrenal adenoma   . 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 Depression   . Diabetes mellitus without complication ([facility])   . Hyperlipidemia   . Hypertension   . Hypothyroidism   . Meralgia paresthetica of right side 01/10/2017  . Migraine   . Pericardial effusion   . 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 APPENDECTOMY    . CYST REMOVAL LEG Right    Removed from right thigh joint  . ORIF HUMERUS FRACTURE Right 07/09/2018   Procedure: OPEN REDUCTION INTERNAL FIXATION (ORIF) HUMERAL SHAFT FRACTURE;  Surgeon: Hiram Gash, MD;  Location: [facility];  Service: Orthopedics;  Laterality: Right;  . TONSILLECTOMY    . TOTAL VAGINAL HYSTERECTOMY    . UMBILICAL HERNIA REPAIR      There were no vitals filed for this visit.  Subjective Assessment - 08/04/18 1407    Subjective  Upset because her sling has come apart and cannot use it. Has made her arm a little more sore. Has tried to find at drugstore and DME stores without luck and was hoping we had something. (Later during session she became tearful due to frustration and then upset that she was tearful because "this is not who I  am..Marland KitchenI've been more emotional since the TIA.")    Pertinent History  Cushings, DM, HTN, meralgia paresthetica, Migraine, pericardial effusion, TIA (at time of fall)    Patient Stated Goals  "fix the inner ear, reduce need for cane, feel comfortable with mobility"    Currently in Pain?  Yes    Pain Score  4     Pain Location  Arm    Pain Orientation  Right    Pain Descriptors / Indicators  Aching;Sore    Pain Type  Acute pain    Pain Onset  1 to 4 weeks ago    Pain Frequency  Intermittent    Aggravating Factors   moving arm    Pain Relieving Factors  rest, meds                        Vestibular Treatment/Exercise - 08/04/18 1503      Vestibular Treatment/Exercise   Vestibular Treatment Provided  Gaze    Gaze Exercises  X1 Viewing Horizontal;X1 Viewing Vertical      X1 Viewing Horizontal   Foot Position  seated    Time  --   up to 30 seconds   Reps  4    Comments  initially  pt sitting in chair holding the card with gym in background. She reported seeing "edge of a second card" before beginning head turns; attempted various distances to see if could merge the card into one card and could not; moved to sitting in front of plain door and she reported the "second edge" of the card was minimal, but still there. The "A" was clear. Performed at 3 feet from target with pt tolerating only 20 seconds with nausea increasing.       X1 Viewing Vertical   Foot Position  seated, standing    Time  --   30   Reps  2    Comments  no change in symptoms, cannot move farther away from target as she has progressive lenses and cannot see "A" clearly without glasses beyond 18"-24"         Balance Exercises - 08/04/18 1510      Balance Exercises: Standing   SLS  Eyes open;Upper extremity support 1;Solid surface;2 reps;10 secs    Tandem Gait  Forward;Upper extremity support;2 reps   forward, LUE on counter no imbalance   Retro Gait  --   along counter with slight imbalance when  turned head to look   Sidestepping  --   solid surface, no UE support   Other Standing Exercises  hip abduction x 5 each LE to assess technique- 1 UE support - difficulty with R LE         PT Education - 08/04/18 1514    Education Details  updated HEP explaining intention to progress VORx1 each session if it has become too easy for her; discussed ultimate goal is 2 head turns per second as encouraging her to turn her head faster; instructed pt to call her MD re: sling and if they want her to continue to wear and if they have any in their office.    Person(s) Educated  Patient    Methods  Explanation;Demonstration;Verbal cues;Handout    Comprehension  Verbalized understanding;Returned demonstration;Verbal cues required;Need further instruction       PT Short Term Goals - 07/29/18 1431      PT SHORT TERM GOAL #1   Title  patient to be independent with HEP for vestibular and balance activities    Time  4    Period  Weeks    Status  New    Target Date  08/26/18      PT SHORT TERM GOAL #2   Title  patient to improve DGI to >/= 15/24    Baseline  12/24    Time  4    Period  Weeks    Status  New    Target Date  08/26/18      PT SHORT TERM GOAL #3   Title  patient to improve gait speed to >/= 3.5 ft/sec with LRAD at Mod I level over level indoor/outdoor surfaces    Baseline  2.7 ft/sec    Time  4    Period  Weeks    Status  New    Target Date  08/26/18        PT Long Term Goals - 07/29/18 1433      PT LONG TERM GOAL #1   Title  patient to be independent with advanced HEP     Time  8    Period  Weeks    Status  New    Target Date  09/23/18      PT LONG TERM GOAL #2  Title  patient to improve gait speed to >/= 4.37 ft/sec with LRAD at IND over various indoor and outdoor surfaces    Time  8    Period  Weeks    Status  New    Target Date  09/23/18      PT LONG TERM GOAL #3   Title  patient to improve DGI to >/= 19/24 demonstrating reduced fall risk    Time  8     Period  Weeks    Status  New    Target Date  09/23/18      PT LONG TERM GOAL #4   Title  patient to report reduction in symptom frequency, duration, intensity by >/= 50%    Time  8    Period  Weeks    Status  New    Target Date  09/23/18      PT LONG TERM GOAL #5   Title  BERG/SOT/FGA assessment and goal as indicated    Time  8    Period  Weeks    Status  New    Target Date  09/23/18            Plan - 08/04/18 1517    Clinical Impression Statement  Kenzly expressing her frustration of feeling she has to start "all over again" each time she comes and has a new therapist. Allowed pt to voice her frustrations (as much as she felt comfortable, once she became tearful she stopped talking and took a moment to regain composure). Explained plan will be to assess how she is doing with her exercises for dizziness each visit to assure they are still appropriately challenging. Only VORx1 horizontally causing symptoms (had pt remain seated due to some double-vision) and had her stop doing VORx1 vertically due to no symptoms elicited in sitting or standing (she has progressive lenses and when glasses removed she has to stand 18-25" away from target to see target clearly.) By end of session pt expressed gratitude for the session and explanations. Anticipate she can continue to progress if she consistently does HEP.     Rehab Potential  Good    PT Frequency  2x / week    PT Duration  8 weeks    PT Treatment/Interventions  ADLs/Self Care Home Management;Canalith Repostioning;DME Instruction;Balance training;Therapeutic exercise;Therapeutic activities;Functional mobility training;Stair training;Gait training;Neuromuscular re-education;Patient/family education;Manual techniques;Passive range of motion;Vestibular;Visual/perceptual remediation/compensation    PT Next Visit Plan   progress horizontal VOR (incr head speed, ?move to standing), balance, gait. may need to test SOT or Berg    PT Home Exercise  Plan  [id number]    Consulted and Agree with Plan of Care  Patient       Patient will benefit from skilled therapeutic intervention in order to improve the following deficits and impairments:  Decreased activity tolerance, Dizziness, Pain, Postural dysfunction, Decreased strength, Decreased balance  Visit Diagnosis: Dizziness and giddiness  Unsteadiness on feet     Problem List Patient Active Problem List   Diagnosis Date Noted  . Fall at home, initial encounter 07/06/2018  . Displaced spiral fracture of shaft of humerus, right arm, initial encounter for closed fracture 07/06/2018  . TIA (transient ischemic attack) 07/06/2018  . Allergic rhinitis 07/04/2018  . Normochromic normocytic anemia 01/20/2018  . Rheumatoid arthritis ([facility]) 04/30/2017  . Lesion of left humerus 04/29/2017  . Pericardial effusion 03/14/2017  . Diabetes mellitus ([facility]) 03/14/2017  . Essential hypertension 03/14/2017  . Mixed hyperlipidemia 03/14/2017  . Ulnar neuropathy at  wrist, 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  . Meralgia paresthetica of right side 01/10/2017  . Allergic rhinoconjunctivitis 07/09/2015  . Asthma, well controlled 07/09/2015  . Laryngopharyngeal reflux (LPR) 07/09/2015    Rexanne Mano, PT 08/04/2018, 3:27 PM  [facility] [address] [place], [place], [address] Phone: [phone]   Fax:  [phone]  Name: EILA RUNYAN MRN: [id number] Date of Birth: 23-Aug-1956

## 2018-08-05 ENCOUNTER — Ambulatory Visit: Payer: BC Managed Care – PPO | Admitting: Occupational Therapy

## 2018-08-05 DIAGNOSIS — M25611 Stiffness of right shoulder, not elsewhere classified: Secondary | ICD-10-CM

## 2018-08-05 DIAGNOSIS — R42 Dizziness and giddiness: Secondary | ICD-10-CM | POA: Diagnosis not present

## 2018-08-05 DIAGNOSIS — M79621 Pain in right upper arm: Secondary | ICD-10-CM

## 2018-08-05 DIAGNOSIS — M6281 Muscle weakness (generalized): Secondary | ICD-10-CM

## 2018-08-05 NOTE — Patient Instructions (Addendum)
   Lie on back holding wand. Raise arms over head. Hold 5sec. Repeat 10 times per set.  Do 1-2sessions per day.   ROM: Abduction - Wand    Stand holding wand behind back. Raise arms as far as possible. Repeat 10 times per set.  Do 1-2 sessions per day.   Press-Up With Wand   Press wand up until elbows are straight, then reach wand over head to a pain free range. Hold 5 seconds. Repeat 10 times. Do 1-2 sessions per day.     Active Assistive Shoulder External Rotation    SIT with stick at waist level, LEFT palm up, other palm down. Step to side, push forearm out from body with hand palm down, and keep elbows bent. Hold. Side step and return to start position. Perform 10 reps. 2X DAY      Copyright  VHI. All rights reserved.  Flexion (Eccentric) - Active-Assist (Cane)    Use unaffected arm to push affected arm forward. Avoid hiking shoulder. Keep palm relaxed. Slowly lower affected arm for 3-5 seconds, increasing use of affected arm. 10 reps per set, 2 sets per day.     [email or web address]

## 2018-08-06 ENCOUNTER — Ambulatory Visit: Payer: BC Managed Care – PPO | Admitting: Physical Therapy

## 2018-08-06 ENCOUNTER — Encounter: Payer: Self-pay | Admitting: Physical Therapy

## 2018-08-06 DIAGNOSIS — Z9181 History of falling: Secondary | ICD-10-CM

## 2018-08-06 DIAGNOSIS — R42 Dizziness and giddiness: Secondary | ICD-10-CM

## 2018-08-06 DIAGNOSIS — R2681 Unsteadiness on feet: Secondary | ICD-10-CM

## 2018-08-06 NOTE — Therapy (Signed)
[facility] [address] [place], [place], [address] Phone: [phone]   Fax:  [phone]  Physical Therapy Treatment  Patient Details  Name: Jocelyn Kaufman MRN: [id number] Date of Birth: [birth year] Referring Provider (PT): Dr. Shelia Media   Encounter Date: 08/06/2018  PT End of Session - 08/06/18 0936    Visit Number  4    Number of Visits  17    Date for PT Re-Evaluation  09/23/18    Authorization Type  BCBS    PT Start Time  0802    PT Stop Time  0845    PT Time Calculation (min)  43 min    Equipment Utilized During Treatment  --   min guard to SUP throughout   Activity Tolerance  Patient tolerated treatment well    Behavior During Therapy  [facility] for tasks assessed/performed       Past Medical History:  Diagnosis Date  . Adrenal adenoma   . Cushing disease ([place])   . Depression   . Diabetes mellitus without complication ([place])   . Hyperlipidemia   . Hypertension   . Hypothyroidism   . Meralgia paresthetica of right side 01/10/2017  . Migraine   . Pericardial effusion   . 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 APPENDECTOMY    . CYST REMOVAL LEG Right    Removed from right thigh joint  . ORIF HUMERUS FRACTURE Right 07/09/2018   Procedure: OPEN REDUCTION INTERNAL FIXATION (ORIF) HUMERAL SHAFT FRACTURE;  Surgeon: Hiram Gash, MD;  Location: [facility];  Service: Orthopedics;  Laterality: Right;  . TONSILLECTOMY    . TOTAL VAGINAL HYSTERECTOMY    . UMBILICAL HERNIA REPAIR      There were no vitals filed for this visit.  Subjective Assessment - 08/06/18 0933    Subjective  not in sling - stated she did not need it this morning, but later stating she wished she had it. Carrying cane back into treatment gym.     Pertinent History  [place], DM, HTN, meralgia paresthetica, Migraine, pericardial effusion, TIA (at time of fall)    Patient Stated Goals  "fix the  inner ear, reduce need for cane, feel comfortable with mobility"    Currently in Pain?  Yes    Pain Score  3     Pain Location  Arm    Pain Orientation  Right    Pain Descriptors / Indicators  Aching;Sore    Pain Type  Acute pain          Neuro re-ed: sensory organization test performed with following results: Conditions: 1:  3 trials WNL 2:  2 trials below normal limits 3:  3 trials below normal limits 4:  3 trials below normal limits 5:  3 trials below normal limits - tearful during this condition as patient reports "flashbacks" of fall   6:  1 fall, 1 trial below normal limit, and 1 WNL Composite score: 60 - below normal for age group Sensory Analysis Som: below normal for age 23:    below normal for age 14:  below normal for age Pref: WNL for age Strategy analysis:   All within "road" other than on condition 6 due to fall with no hip activation COG alignment:  Posterior bias with noted preference for weight bearing throughout heels    Balance Exercises - 08/06/18 0947      Balance Exercises: Standing  Other Standing Exercises  fwd/bwd stepping from AirEx with 1 UE support x 10 each LE/direction - fear of falling         PT Education - 08/06/18 0935    Education Details  Education on reasoning to perform and results following SOT.     Person(s) Educated  Patient    Methods  Explanation;Demonstration;Handout    Comprehension  Verbalized understanding       PT Short Term Goals - 07/29/18 1431      PT SHORT TERM GOAL #1   Title  patient to be independent with HEP for vestibular and balance activities    Time  4    Period  Weeks    Status  New    Target Date  08/26/18      PT SHORT TERM GOAL #2   Title  patient to improve DGI to >/= 15/24    Baseline  12/24    Time  4    Period  Weeks    Status  New    Target Date  08/26/18      PT SHORT TERM GOAL #3   Title  patient to improve gait speed to >/= 3.5 ft/sec with LRAD at Mod I level over level  indoor/outdoor surfaces    Baseline  2.7 ft/sec    Time  4    Period  Weeks    Status  New    Target Date  08/26/18        PT Long Term Goals - 08/06/18 0938      PT LONG TERM GOAL #1   Title  patient to be independent with advanced HEP     Time  8    Period  Weeks    Status  New      PT LONG TERM GOAL #2   Title  patient to improve gait speed to >/= 4.37 ft/sec with LRAD at IND over various indoor and outdoor surfaces    Time  8    Period  Weeks    Status  New      PT LONG TERM GOAL #3   Title  patient to improve DGI to >/= 19/24 demonstrating reduced fall risk    Time  8    Period  Weeks    Status  New      PT LONG TERM GOAL #4   Title  patient to report reduction in symptom frequency, duration, intensity by >/= 50%    Time  8    Period  Weeks    Status  New      PT LONG TERM GOAL #5   Title  Patient to improve SOT composite score to WNL for age    Baseline  28    Time  8    Period  Weeks    Status  Revised            Plan - 08/06/18 0939    Clinical Impression Statement  SOT assessment today - patient becoming tearful during exam as she reported "flashbacks" of fall and required increased time to compse herself. Composite score of 60 which is below her age related norms with noted deficts in all three areas - somatosensory, vision, and vestibular. Education on results of testing with good verbal understanding. Following SOT PT session focusing on stance on complaint surface with fwd/bwd stepping with patient requiring 1 UE support.     Rehab Potential  Good    PT Frequency  2x /  week    PT Duration  8 weeks    PT Treatment/Interventions  ADLs/Self Care Home Management;Canalith Repostioning;DME Instruction;Balance training;Therapeutic exercise;Therapeutic activities;Functional mobility training;Stair training;Gait training;Neuromuscular re-education;Patient/family education;Manual techniques;Passive range of motion;Vestibular;Visual/perceptual  remediation/compensation    PT Next Visit Plan   progress horizontal VOR (incr head speed, ?move to standing), balance, gait. may need to test [place] and Agree with Plan of Care  Patient       Patient will benefit from skilled therapeutic intervention in order to improve the following deficits and impairments:  Decreased activity tolerance, Dizziness, Pain, Postural dysfunction, Decreased strength, Decreased balance  Visit Diagnosis: Dizziness and giddiness  Unsteadiness on feet  History of falling     Problem List Patient Active Problem List   Diagnosis Date Noted  . Fall at home, initial encounter 07/06/2018  . Displaced spiral fracture of shaft of humerus, right arm, initial encounter for closed fracture 07/06/2018  . TIA (transient ischemic attack) 07/06/2018  . Allergic rhinitis 07/04/2018  . Normochromic normocytic anemia 01/20/2018  . Rheumatoid arthritis ([place]) 04/30/2017  . Lesion of left humerus 04/29/2017  . Pericardial effusion 03/14/2017  . Diabetes mellitus ([place]) 03/14/2017  . Essential hypertension 03/14/2017  . Mixed hyperlipidemia 03/14/2017  . 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 Meralgia paresthetica of right side 01/10/2017  . Allergic rhinoconjunctivitis 07/09/2015  . Asthma, well controlled 07/09/2015  . Laryngopharyngeal reflux (LPR) 07/09/2015    Lanney Gins, PT, DPT Supplemental Physical Therapist 08/06/18 9:50 AM Pager: [phone] Office: [facility] [address] [place], [place], [address] Phone: [phone]   Fax:  [phone]  Name: Jocelyn Kaufman MRN: [id number] Date of Birth: 1956/04/27

## 2018-08-06 NOTE — Therapy (Signed)
[facility] [address] [place], [place], [address] Phone: [phone]   Fax:  [phone]  Occupational Therapy Treatment  Patient Details  Name: Jocelyn Kaufman MRN: [id number] Date of Birth: [birth year] Referring Provider (OT): Deland Pretty   Encounter Date: 08/05/2018  OT End of Session - 08/06/18 1744    Visit Number  3    Number of Visits  21    Date for OT Re-Evaluation  09/28/18    Authorization Type  BCBS, [place]    OT Start Time  1320   2 units, ice pack end of session   OT Stop Time  1400    OT Time Calculation (min)  40 min    Activity Tolerance  Patient tolerated treatment well    Behavior During Therapy  [facility] for tasks assessed/performed       Past Medical History:  Diagnosis Date  . Adrenal adenoma   . 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 Depression   . Diabetes mellitus without complication ([place])   . Hyperlipidemia   . Hypertension   . Hypothyroidism   . Meralgia paresthetica of right side 01/10/2017  . Migraine   . Pericardial effusion   . 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 APPENDECTOMY    . CYST REMOVAL LEG Right    Removed from right thigh joint  . ORIF HUMERUS FRACTURE Right 07/09/2018   Procedure: OPEN REDUCTION INTERNAL FIXATION (ORIF) HUMERAL SHAFT FRACTURE;  Surgeon: Hiram Gash, MD;  Location: [facility];  Service: Orthopedics;  Laterality: Right;  . TONSILLECTOMY    . TOTAL VAGINAL HYSTERECTOMY    . UMBILICAL HERNIA REPAIR      There were no vitals filed for this visit.  Subjective Assessment - 08/06/18 1742    Subjective   Pt reports she is able to move arm more now    Limitations  Pt is now cleared for ROM as tolerated    Currently in Pain?  Yes    Pain Score  3     Pain Location  Arm    Pain Orientation  Right    Pain Descriptors / Indicators  Aching;Sore    Pain Type  Acute pain    Pain Onset  1 to 4 weeks ago    Pain  Frequency  Intermittent    Aggravating Factors   moving arm    Pain Relieving Factors  rest, meds                Supine gentle AA/ROM/P/ROM for shoulder flexion and abduction  Arm bike x 3 mins, no resistance for increased A/ROM Ice pack to upper arm x 8 mins for pain relief, no adverse reactions.            OT Education - 08/06/18 1745    Education Details  updated HEP for AA/ROM with cane -see pt instructions   Person(s) Educated  Patient    Methods  Explanation    Comprehension  Verbalized understanding , returned demo, needs v.c      OT Short Term Goals - 07/30/18 0841      OT SHORT TERM GOAL #1   Title  Patient will complete an HEP designed to increase elbow through hand AROM DUE 08/29/2018    Time  4    Period  Weeks    Status  New    Target Date  08/29/18  OT SHORT TERM GOAL #2   Title  Patient will demonstrate knowledge and effective technique for scar mobilization and management    Time  4    Period  Weeks    Status  New      OT SHORT TERM GOAL #3   Title  Patient will demonstrate understanding of safe transition out of sling schedule    Time  4    Period  Weeks    Status  New      OT SHORT TERM GOAL #4   Title  Patient will complete HEP designed for self ROM for right shoulder    Time  4    Period  Weeks    Status  New      OT SHORT TERM GOAL #5   Title  Patient will incorporate right hand into daily functional tasks as able, incorporating elbow, flroearm, wrist and hand motion, e.g. writing, feeding, oral hygiene, aspects of bathing, dresisng    Time  4    Period  Weeks    Status  New        OT Long Term Goals - 07/30/18 0847      OT LONG TERM GOAL #1   Title  Patient will complete update HEP designed to improve AROM RUE DUE 09/28/18    Time  8    Period  Weeks    Status  New    Target Date  09/28/18      OT LONG TERM GOAL #2   Title  Patient will demonstrate adequate shoulder range of motion and strength to obtain /  place a lightweight (LESS THAN 2LB) object on chest height shelf with RUE    Time  8    Period  Weeks    Status  New      OT LONG TERM GOAL #3   Title  Patient will demonstrate adequate strength and range of motion to use a curling iron for hair care - one hand for comb, one hand for iron    Time  8    Period  Weeks    Status  New      OT LONG TERM GOAL #4   Title  Patient will demonstrate adequate strength and range of motion to apply and remove seat belt, includes buckle (driver's side or passenger side) without report of increased pain      OT LONG TERM GOAL #5   Title  Patient will demonstrate adequate range of motion and consistent pressure to use BUE's to guide material through sewing machine    Time  8    Period  Weeks    Status  New            Plan - 08/06/18 1747    Clinical Impression Statement  Pt is progressing towards goals. She demonstrates progress towards goals with improving ROM and decreasing pain    Occupational Profile and client history currently impacting functional performance  retired Pharmacist, hospital, Psychologist, occupational, enjoys chair yoga, Radiographer, therapeutic    Occupational performance deficits (Please refer to evaluation for details):  ADL's;IADL's;Rest and Sleep;Leisure;Social Participation    Rehab Potential  Good    Current Impairments/barriers affecting progress:  pain, fear, need to establish trust     OT Frequency  3x / week    OT Duration  4 weeks   followed by 2x week for 4 weeks   OT Treatment/Interventions  Self-care/ADL training;Therapeutic exercise;Functional Mobility Training;Balance training;Aquatic Therapy;Ultrasound;Neuromuscular education;Manual Therapy;Splinting;Energy conservation;Therapeutic activities;Coping strategies training;DME  and/or AE instruction;Cognitive remediation/compensation;Electrical Stimulation;Scar mobilization;Moist Heat;Cryotherapy;Passive range of motion;Patient/family education    Plan  continue ROM as tolerated    Consulted  and Agree with Plan of Care  Patient       Patient will benefit from skilled therapeutic intervention in order to improve the following deficits and impairments:  Decreased endurance, Decreased skin integrity, Increased muscle spasms, Decreased coping skills, Decreased knowledge of precautions, Decreased scar mobility, Impaired perceived functional ability, Improper body mechanics, Decreased activity tolerance, Decreased knowledge of use of DME, Decreased strength, Impaired flexibility, Decreased balance, Decreased mobility, Difficulty walking, Decreased cognition, Decreased range of motion, Increased edema, Pain, Decreased coordination, Decreased safety awareness, Increased fascial restrictions, Impaired UE functional use  Visit Diagnosis: Stiffness of right shoulder, not elsewhere classified  Pain in right upper arm  Muscle weakness (generalized)    Problem List Patient Active Problem List   Diagnosis Date Noted  . Fall at home, initial encounter 07/06/2018  . Displaced spiral fracture of shaft of humerus, right arm, initial encounter for closed fracture 07/06/2018  . TIA (transient ischemic attack) 07/06/2018  . Allergic rhinitis 07/04/2018  . Normochromic normocytic anemia 01/20/2018  . Rheumatoid arthritis ([place]) 04/30/2017  . Lesion of left humerus 04/29/2017  . Pericardial effusion 03/14/2017  . Diabetes mellitus ([place]) 03/14/2017  . Essential hypertension 03/14/2017  . Mixed hyperlipidemia 03/14/2017  . 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 Meralgia paresthetica of right side 01/10/2017  . Allergic rhinoconjunctivitis 07/09/2015  . Asthma, well controlled 07/09/2015  . Laryngopharyngeal reflux (LPR) 07/09/2015    Jocelyn Kaufman 08/06/2018, 5:48 PM  [facility] [address] [place], [place], [address] Phone: [phone]   Fax:  [phone]  Name: Jocelyn Kaufman MRN: [id number] Date of Birth: 03/13/1956

## 2018-08-07 ENCOUNTER — Ambulatory Visit: Payer: BC Managed Care – PPO | Admitting: Occupational Therapy

## 2018-08-07 DIAGNOSIS — M79621 Pain in right upper arm: Secondary | ICD-10-CM

## 2018-08-07 DIAGNOSIS — I69815 Cognitive social or emotional deficit following other cerebrovascular disease: Secondary | ICD-10-CM

## 2018-08-07 DIAGNOSIS — M25611 Stiffness of right shoulder, not elsewhere classified: Secondary | ICD-10-CM

## 2018-08-07 DIAGNOSIS — M6281 Muscle weakness (generalized): Secondary | ICD-10-CM

## 2018-08-07 NOTE — Therapy (Signed)
[facility] [address] [place], [place], [address] Phone: [phone]   Fax:  [phone]  Occupational Therapy Treatment  Patient Details  Name: Jocelyn Kaufman MRN: [id number] Date of Birth: [birth year] Referring Provider (OT): Deland Pretty   Encounter Date: 08/07/2018  OT End of Session - 08/07/18 1421    Visit Number  4    Number of Visits  21    Date for OT Re-Evaluation  09/28/18    Authorization Type  BCBS, [place]    OT Start Time  1405   2 units only ice pack    OT Stop Time  1446    OT Time Calculation (min)  41 min    Activity Tolerance  Patient tolerated treatment well    Behavior During Therapy  [facility] for tasks assessed/performed       Past Medical History:  Diagnosis Date  . Adrenal adenoma   . 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)   . Depression   . Diabetes mellitus without complication ([place])   . Hyperlipidemia   . Hypertension   . Hypothyroidism   . Meralgia paresthetica of right side 01/10/2017  . Migraine   . Pericardial effusion   . 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 APPENDECTOMY    . CYST REMOVAL LEG Right    Removed from right thigh joint  . ORIF HUMERUS FRACTURE Right 07/09/2018   Procedure: OPEN REDUCTION INTERNAL FIXATION (ORIF) HUMERAL SHAFT FRACTURE;  Surgeon: Hiram Gash, MD;  Location: [facility];  Service: Orthopedics;  Laterality: Right;  . TONSILLECTOMY    . TOTAL VAGINAL HYSTERECTOMY    . UMBILICAL HERNIA REPAIR      There were no vitals filed for this visit.  Subjective Assessment - 08/07/18 1418    Limitations  Pt is now cleared for ROM as tolerated    Currently in Pain?  Yes    Pain Score  6     Pain Location  Arm    Pain Orientation  Right    Pain Descriptors / Indicators  Aching    Pain Onset  1 to 4 weeks ago    Pain Frequency  Intermittent    Aggravating Factors   movement    Pain Relieving Factors  rest meds      Multiple Pain Sites  No              Treatment: AA/ROM shoulder flexion and abduction in supine unilaterally with therapist assist, followed by closed chain chest press, shoulder flexion with cane in supine.  Seated internal/ external rotation and shoulder abduction, biceps curls A/ROM/AA/ROM with cane, then A/ROM forearm / supination/ pronation Pt became dizzy and nauseous when she transitioned supine- sit, ginger ale provided Ice pack applied to right shoulder and upper arm x 6 mins for pain relief, no adverse reactions.               OT Short Term Goals - 07/30/18 0841      OT SHORT TERM GOAL #1   Title  Patient will complete an HEP designed to increase elbow through hand AROM DUE 08/29/2018    Time  4    Period  Weeks    Status  New    Target Date  08/29/18      OT SHORT TERM GOAL #2   Title  Patient will demonstrate knowledge and effective technique for scar mobilization and management  Time  4    Period  Weeks    Status  New      OT SHORT TERM GOAL #3   Title  Patient will demonstrate understanding of safe transition out of sling schedule    Time  4    Period  Weeks    Status  New      OT SHORT TERM GOAL #4   Title  Patient will complete HEP designed for self ROM for right shoulder    Time  4    Period  Weeks    Status  New      OT SHORT TERM GOAL #5   Title  Patient will incorporate right hand into daily functional tasks as able, incorporating elbow, flroearm, wrist and hand motion, e.g. writing, feeding, oral hygiene, aspects of bathing, dresisng    Time  4    Period  Weeks    Status  New        OT Long Term Goals - 07/30/18 0847      OT LONG TERM GOAL #1   Title  Patient will complete update HEP designed to improve AROM RUE DUE 09/28/18    Time  8    Period  Weeks    Status  New    Target Date  09/28/18      OT LONG TERM GOAL #2   Title  Patient will demonstrate adequate shoulder range of motion and strength to obtain / place a  lightweight (LESS THAN 2LB) object on chest height shelf with RUE    Time  8    Period  Weeks    Status  New      OT LONG TERM GOAL #3   Title  Patient will demonstrate adequate strength and range of motion to use a curling iron for hair care - one hand for comb, one hand for iron    Time  8    Period  Weeks    Status  New      OT LONG TERM GOAL #4   Title  Patient will demonstrate adequate strength and range of motion to apply and remove seat belt, includes buckle (driver's side or passenger side) without report of increased pain      OT LONG TERM GOAL #5   Title  Patient will demonstrate adequate range of motion and consistent pressure to use BUE's to guide material through sewing machine    Time  8    Period  Weeks    Status  New            Plan - 08/07/18 1510    Clinical Impression Statement  Pt is progressing towards goals. She demonstrates progress towards goals with improving ROM however pt demonstrates significant pain today    Occupational Profile and client history currently impacting functional performance  retired Pharmacist, hospital, Psychologist, occupational, enjoys chair yoga, Radiographer, therapeutic    Occupational performance deficits (Please refer to evaluation for details):  ADL's;IADL's;Rest and Sleep;Leisure;Social Participation    Rehab Potential  Good    Current Impairments/barriers affecting progress:  pain, fear, need to establish trust     OT Frequency  3x / week    OT Duration  4 weeks   followed by 2x week for 4 weeks   OT Treatment/Interventions  Self-care/ADL training;Therapeutic exercise;Functional Mobility Training;Balance training;Aquatic Therapy;Ultrasound;Neuromuscular education;Manual Therapy;Splinting;Energy conservation;Therapeutic activities;Coping strategies training;DME and/or AE instruction;Cognitive remediation/compensation;Electrical Stimulation;Scar mobilization;Moist Heat;Cryotherapy;Passive range of motion;Patient/family education    Plan  continue ROM as  tolerated,  ice for pain managment    Consulted and Agree with Plan of Care  Patient       Patient will benefit from skilled therapeutic intervention in order to improve the following deficits and impairments:  Decreased endurance, Decreased skin integrity, Increased muscle spasms, Decreased coping skills, Decreased knowledge of precautions, Decreased scar mobility, Impaired perceived functional ability, Improper body mechanics, Decreased activity tolerance, Decreased knowledge of use of DME, Decreased strength, Impaired flexibility, Decreased balance, Decreased mobility, Difficulty walking, Decreased cognition, Decreased range of motion, Increased edema, Pain, Decreased coordination, Decreased safety awareness, Increased fascial restrictions, Impaired UE functional use  Visit Diagnosis: Stiffness of right shoulder, not elsewhere classified  Pain in right upper arm  Muscle weakness (generalized)  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 Fall at home, initial encounter 07/06/2018  . Displaced spiral fracture of shaft of humerus, right arm, initial encounter for closed fracture 07/06/2018  . TIA (transient ischemic attack) 07/06/2018  . Allergic rhinitis 07/04/2018  . Normochromic normocytic anemia 01/20/2018  . Rheumatoid arthritis ([place]) 04/30/2017  . Lesion of left humerus 04/29/2017  . Pericardial effusion 03/14/2017  . Diabetes mellitus ([place]) 03/14/2017  . Essential hypertension 03/14/2017  . Mixed hyperlipidemia 03/14/2017  . 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 Meralgia paresthetica of right side 01/10/2017  . Allergic rhinoconjunctivitis 07/09/2015  . Asthma, well controlled 07/09/2015  . Laryngopharyngeal reflux (LPR) 07/09/2015    Rainer Mounce 08/07/2018, 3:11 PM  [facility] [address] [place], [place], [address] Phone: [phone]   Fax:  [phone]  Name: Jocelyn Kaufman MRN: [id number] Date of Birth: 07-19-1956

## 2018-08-11 ENCOUNTER — Ambulatory Visit (INDEPENDENT_AMBULATORY_CARE_PROVIDER_SITE_OTHER): Payer: BC Managed Care – PPO | Admitting: *Deleted

## 2018-08-11 DIAGNOSIS — J309 Allergic rhinitis, unspecified: Secondary | ICD-10-CM | POA: Diagnosis not present

## 2018-08-12 ENCOUNTER — Ambulatory Visit: Payer: BC Managed Care – PPO | Admitting: Occupational Therapy

## 2018-08-12 DIAGNOSIS — I69815 Cognitive social or emotional deficit following other cerebrovascular disease: Secondary | ICD-10-CM

## 2018-08-12 DIAGNOSIS — M79621 Pain in right upper arm: Secondary | ICD-10-CM

## 2018-08-12 DIAGNOSIS — M6281 Muscle weakness (generalized): Secondary | ICD-10-CM

## 2018-08-12 DIAGNOSIS — R42 Dizziness and giddiness: Secondary | ICD-10-CM | POA: Diagnosis not present

## 2018-08-12 DIAGNOSIS — M25611 Stiffness of right shoulder, not elsewhere classified: Secondary | ICD-10-CM

## 2018-08-12 NOTE — Therapy (Signed)
[facility] [address] [place], [place], [address] Phone: [phone]   Fax:  [phone]  Occupational Therapy Treatment  Patient Details  Name: Jocelyn Kaufman MRN: [id number] Date of Birth: [birth year] Referring Provider (OT): Deland Pretty   Encounter Date: 08/12/2018  OT End of Session - 08/12/18 1338    Visit Number  5    Number of Visits  21    Date for OT Re-Evaluation  09/28/18    Authorization Type  BCBS, VL=[place]    OT Start Time  1517    OT Stop Time  1408    OT Time Calculation (min)  51 min    Activity Tolerance  Patient tolerated treatment well    Behavior During Therapy  [facility] for tasks assessed/performed       Past Medical History:  Diagnosis Date  . Adrenal adenoma   . 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 Depression   . Diabetes mellitus without complication ([place])   . Hyperlipidemia   . Hypertension   . Hypothyroidism   . Meralgia paresthetica of right side 01/10/2017  . Migraine   . Pericardial effusion   . 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 APPENDECTOMY    . CYST REMOVAL LEG Right    Removed from right thigh joint  . ORIF HUMERUS FRACTURE Right 07/09/2018   Procedure: OPEN REDUCTION INTERNAL FIXATION (ORIF) HUMERAL SHAFT FRACTURE;  Surgeon: Hiram Gash, MD;  Location: [facility];  Service: Orthopedics;  Laterality: Right;  . TONSILLECTOMY    . TOTAL VAGINAL HYSTERECTOMY    . UMBILICAL HERNIA REPAIR      There were no vitals filed for this visit.  Subjective Assessment - 08/12/18 1350    Limitations  Pt is now cleared for ROM as tolerated    Currently in Pain?  Yes    Pain Score  4     Pain Location  Arm    Pain Orientation  Right    Pain Descriptors / Indicators  Aching    Pain Type  Acute pain    Pain Onset  1 to 4 weeks ago    Pain Frequency  Intermittent    Aggravating Factors   movment    Pain Relieving Factors  rest, meds     Multiple Pain Sites  No                Treatment: AA/ROM shoulder flexion and abduction in supine unilaterally with therapist assist, followed by closed chain chest press, shoulder flexion with cane in supine and the triceps extension A/ROM, min v.c.  Seated internal/ external rotation and shoulder abduction, A/ROM/AA/ROM with cane,  Arm bike x 5 mins level 1 for conditioning/ reciprocal movement Functional mid range reaching to place large pegs into pegboard with RUE, min-mod difficulty/ v.c Pt fatigued after placing 1 row of pegs. Ice pack applied to right shoulder and upper arm x 8 mins for pain relief, no adverse reactions.             OT Short Term Goals - 08/12/18 2012      OT SHORT TERM GOAL #1   Title  Patient will complete an HEP designed to increase elbow through hand AROM DUE 08/29/2018    Time  4    Period  Weeks    Status  On-going      OT SHORT TERM GOAL #2   Title  Patient will demonstrate knowledge and effective technique for scar mobilization and management    Time  4    Period  Weeks    Status  On-going      OT SHORT TERM GOAL #3   Title  Patient will demonstrate understanding of safe transition out of sling schedule    Time  4    Period  Weeks    Status  Achieved      OT SHORT TERM GOAL #4   Title  Patient will complete HEP designed for self ROM for right shoulder    Time  4    Period  Weeks    Status  Achieved      OT SHORT TERM GOAL #5   Title  Patient will incorporate right hand into daily functional tasks as able, incorporating elbow, flroearm, wrist and hand motion, e.g. writing, feeding, oral hygiene, aspects of bathing, dresisng    Time  4    Period  Weeks    Status  On-going        OT Long Term Goals - 08/12/18 2012      OT LONG TERM GOAL #1   Title  Patient will complete update HEP designed to improve AROM RUE DUE 09/28/18    Time  8    Period  Weeks    Status  New      OT LONG TERM GOAL #2   Title  Patient will  demonstrate adequate shoulder range of motion and strength to obtain / place a lightweight (LESS THAN 2LB) object on chest height shelf with RUE    Time  8    Period  Weeks    Status  New      OT LONG TERM GOAL #3   Title  Patient will demonstrate adequate strength and range of motion to use a curling iron for hair care - one hand for comb, one hand for iron    Time  8    Period  Weeks    Status  New      OT LONG TERM GOAL #4   Title  Patient will demonstrate adequate strength and range of motion to apply and remove seat belt, includes buckle (driver's side or passenger side) without report of increased pain      OT LONG TERM GOAL #5   Title  Patient will demonstrate adequate range of motion and consistent pressure to use BUE's to guide material through sewing machine    Time  8    Period  Weeks    Status  New            Plan - 08/12/18 1348    Clinical Impression Statement  Pt is progressing towards goals. She demonstrates progress towards goals with improving ROM. Pt's RUE fatigues quickly during light functional activity.    Occupational Profile and client history currently impacting functional performance  retired Pharmacist, hospital, Psychologist, occupational, enjoys chair yoga, Radiographer, therapeutic    Occupational performance deficits (Please refer to evaluation for details):  ADL's;IADL's;Rest and Sleep;Leisure;Social Participation    Rehab Potential  Good    Current Impairments/barriers affecting progress:  pain, fear, need to establish trust     OT Frequency  3x / week    OT Duration  4 weeks   followed by 2x week x 4 weeks.   OT Treatment/Interventions  Self-care/ADL training;Therapeutic exercise;Functional Mobility Training;Balance training;Aquatic Therapy;Ultrasound;Neuromuscular education;Manual Therapy;Splinting;Energy conservation;Therapeutic activities;Coping strategies training;DME and/or AE instruction;Cognitive remediation/compensation;Electrical Stimulation;Scar mobilization;Moist  Heat;Cryotherapy;Passive range of motion;Patient/family  education    Plan  continue ROM as tolerated, ice for pain managment, light functional use    Consulted and Agree with Plan of Care  Patient       Patient will benefit from skilled therapeutic intervention in order to improve the following deficits and impairments:  Decreased endurance, Decreased skin integrity, Increased muscle spasms, Decreased coping skills, Decreased knowledge of precautions, Decreased scar mobility, Impaired perceived functional ability, Improper body mechanics, Decreased activity tolerance, Decreased knowledge of use of DME, Decreased strength, Impaired flexibility, Decreased balance, Decreased mobility, Difficulty walking, Decreased cognition, Decreased range of motion, Increased edema, Pain, Decreased coordination, Decreased safety awareness, Increased fascial restrictions, Impaired UE functional use  Visit Diagnosis: Stiffness of right shoulder, not elsewhere classified  Pain in right upper arm  Muscle weakness (generalized)  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 Fall at home, initial encounter 07/06/2018  . Displaced spiral fracture of shaft of humerus, right arm, initial encounter for closed fracture 07/06/2018  . TIA (transient ischemic attack) 07/06/2018  . Allergic rhinitis 07/04/2018  . Normochromic normocytic anemia 01/20/2018  . Rheumatoid arthritis ([place]) 04/30/2017  . Lesion of left humerus 04/29/2017  . Pericardial effusion 03/14/2017  . Diabetes mellitus ([place]) 03/14/2017  . Essential hypertension 03/14/2017  . Mixed hyperlipidemia 03/14/2017  . 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 Meralgia paresthetica of right side 01/10/2017  . Allergic rhinoconjunctivitis 07/09/2015  . Asthma, well controlled 07/09/2015  . Laryngopharyngeal reflux (LPR) 07/09/2015     RINE,KATHRYN 08/12/2018, 8:13 PM  [facility] [address] [place], [place], [address] Phone: [phone]   Fax:  [phone]  Name: Jocelyn Kaufman MRN: [id number] Date of Birth: 1956-10-26

## 2018-08-13 ENCOUNTER — Ambulatory Visit: Payer: BC Managed Care – PPO | Admitting: Physical Therapy

## 2018-08-13 ENCOUNTER — Encounter: Payer: Self-pay | Admitting: Physical Therapy

## 2018-08-13 ENCOUNTER — Ambulatory Visit: Payer: BC Managed Care – PPO | Admitting: Occupational Therapy

## 2018-08-13 DIAGNOSIS — R2681 Unsteadiness on feet: Secondary | ICD-10-CM

## 2018-08-13 DIAGNOSIS — M79621 Pain in right upper arm: Secondary | ICD-10-CM

## 2018-08-13 DIAGNOSIS — Z9181 History of falling: Secondary | ICD-10-CM

## 2018-08-13 DIAGNOSIS — M6281 Muscle weakness (generalized): Secondary | ICD-10-CM

## 2018-08-13 DIAGNOSIS — R42 Dizziness and giddiness: Secondary | ICD-10-CM

## 2018-08-13 DIAGNOSIS — M25611 Stiffness of right shoulder, not elsewhere classified: Secondary | ICD-10-CM

## 2018-08-13 NOTE — Therapy (Signed)
[facility] [address] [place], [place], [address] Phone: [phone]   Fax:  [phone]  Physical Therapy Treatment  Patient Details  Name: Jocelyn Kaufman MRN: [id number] Date of Birth: [birth year] Referring Provider (PT): Dr. Shelia Media   Encounter Date: 08/13/2018  PT End of Session - 08/13/18 1025    Visit Number  5    Number of Visits  17    Date for PT Re-Evaluation  09/23/18    Authorization Type  BCBS    PT Start Time  1020    PT Stop Time  1100    PT Time Calculation (min)  40 min    Activity Tolerance  Patient tolerated treatment well    Behavior During Therapy  [facility] for tasks assessed/performed       Past Medical History:  Diagnosis Date  . Adrenal adenoma   . 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 Depression   . Diabetes mellitus without complication ([place])   . Hyperlipidemia   . Hypertension   . Hypothyroidism   . Meralgia paresthetica of right side 01/10/2017  . Migraine   . Pericardial effusion   . 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 APPENDECTOMY    . CYST REMOVAL LEG Right    Removed from right thigh joint  . ORIF HUMERUS FRACTURE Right 07/09/2018   Procedure: OPEN REDUCTION INTERNAL FIXATION (ORIF) HUMERAL SHAFT FRACTURE;  Surgeon: Hiram Gash, MD;  Location: [facility];  Service: Orthopedics;  Laterality: Right;  . TONSILLECTOMY    . TOTAL VAGINAL HYSTERECTOMY    . UMBILICAL HERNIA REPAIR      There were no vitals filed for this visit.  Subjective Assessment - 08/13/18 1022    Subjective  things are going well - no falls; currently wearing heart monitor - takes off on 10/23    Pertinent History  Cushings, DM, HTN, meralgia paresthetica, Migraine, pericardial effusion, TIA (at time of fall)    Patient Stated Goals  "fix the inner ear, reduce need for cane, feel comfortable with mobility"    Currently in Pain?  Yes    Pain Score  2     Pain  Location  Arm    Pain Orientation  Right    Pain Descriptors / Indicators  Aching;Dull    Pain Type  Acute pain                            Balance Exercises - 08/13/18 1322      Balance Exercises: Standing   Standing Eyes Opened  Narrow base of support (BOS);Wide (BOA);Head turns;Foam/compliant surface;3 reps;30 secs   horizontal and vertical - progess wide BOS to narrow   Standing Eyes Closed  Narrow base of support (BOS);Foam/compliant surface;3 reps;30 secs    Rockerboard  Anterior/posterior;EO;5 reps;20 seconds;Lateral   increased difficulty with forward gaze   Other Standing Exercises  stnading on ramp (up/down) with EC 3 x 30 sec          PT Short Term Goals - 07/29/18 1431      PT SHORT TERM GOAL #1   Title  patient to be independent with HEP for vestibular and balance activities    Time  4    Period  Weeks    Status  New    Target Date  08/26/18      PT SHORT TERM GOAL #  2   Title  patient to improve DGI to >/= 15/24    Baseline  12/24    Time  4    Period  Weeks    Status  New    Target Date  08/26/18      PT SHORT TERM GOAL #3   Title  patient to improve gait speed to >/= 3.5 ft/sec with LRAD at Mod I level over level indoor/outdoor surfaces    Baseline  2.7 ft/sec    Time  4    Period  Weeks    Status  New    Target Date  08/26/18        PT Long Term Goals - 08/06/18 0938      PT LONG TERM GOAL #1   Title  patient to be independent with advanced HEP     Time  8    Period  Weeks    Status  New      PT LONG TERM GOAL #2   Title  patient to improve gait speed to >/= 4.37 ft/sec with LRAD at IND over various indoor and outdoor surfaces    Time  8    Period  Weeks    Status  New      PT LONG TERM GOAL #3   Title  patient to improve DGI to >/= 19/24 demonstrating reduced fall risk    Time  8    Period  Weeks    Status  New      PT LONG TERM GOAL #4   Title  patient to report reduction in symptom frequency, duration,  intensity by >/= 50%    Time  8    Period  Weeks    Status  New      PT LONG TERM GOAL #5   Title  Patient to improve SOT composite score to WNL for age    Baseline  60    Time  8    Period  Weeks    Status  Revised            Plan - 08/13/18 1318    Clinical Impression Statement  [name] doing well today - Skiiled PT session today focusing on challenging vestibular system with work on compliant surfaces and with head turns and eyes closed. Patient with seemingly less fear in session allowing herself to porgress and work on more challenging activities. Will continue to progress balance challenge as patient tolerates.     Rehab Potential  Good    PT Frequency  2x / week    PT Duration  8 weeks    PT Treatment/Interventions  ADLs/Self Care Home Management;Canalith Repostioning;DME Instruction;Balance training;Therapeutic exercise;Therapeutic activities;Functional mobility training;Stair training;Gait training;Neuromuscular re-education;Patient/family education;Manual techniques;Passive range of motion;Vestibular;Visual/perceptual remediation/compensation    PT Next Visit Plan   progress horizontal VOR (incr head speed, ?move to standing), balance, gait. may need to test Berg; increase vestibular input with balance    PT Home Exercise Plan  [id number]    Consulted and Agree with Plan of Care  Patient       Patient will benefit from skilled therapeutic intervention in order to improve the following deficits and impairments:  Decreased activity tolerance, Dizziness, Pain, Postural dysfunction, Decreased strength, Decreased balance  Visit Diagnosis: Dizziness and giddiness  Unsteadiness on feet  History of falling     Problem List Patient Active Problem List   Diagnosis Date Noted  . Fall at home, initial encounter 07/06/2018  . Displaced spiral  fracture of shaft of humerus, right arm, initial encounter for closed fracture 07/06/2018  . TIA (transient ischemic attack) 07/06/2018   . Allergic rhinitis 07/04/2018  . Normochromic normocytic anemia 01/20/2018  . Rheumatoid arthritis ([place]) 04/30/2017  . Lesion of left humerus 04/29/2017  . Pericardial effusion 03/14/2017  . Diabetes mellitus ([place]) 03/14/2017  . Essential hypertension 03/14/2017  . Mixed hyperlipidemia 03/14/2017  . 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 Meralgia paresthetica of right side 01/10/2017  . Allergic rhinoconjunctivitis 07/09/2015  . Asthma, well controlled 07/09/2015  . Laryngopharyngeal reflux (LPR) 07/09/2015   Lanney Gins, PT, DPT Supplemental Physical Therapist 08/13/18 1:28 PM Pager: [phone] Office: [facility] [address] [place], [place], [address] Phone: [phone]   Fax:  [phone]  Name: Jocelyn Kaufman MRN: [id number] Date of Birth: 1956-06-17

## 2018-08-13 NOTE — Therapy (Signed)
[facility] [address] [place], [place], [address] Phone: [phone]   Fax:  [phone]  Occupational Therapy Treatment  Patient Details  Name: Jocelyn Kaufman MRN: [id number] Date of Birth: 02-03-1956 Referring Provider (OT): Deland Pretty   Encounter Date: 08/13/2018  OT End of Session - 08/13/18 1149    Visit Number  6    Number of Visits  21    Date for OT Re-Evaluation  09/28/18    Authorization Type  BCBS, VL=[place]    OT Start Time  1100    OT Stop Time  1150   10 min. of ice at end of session   OT Time Calculation (min)  50 min    Activity Tolerance  Patient tolerated treatment well    Behavior During Therapy  [facility] for tasks assessed/performed       Past Medical History:  Diagnosis Date  . Adrenal adenoma   . 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 Depression   . Diabetes mellitus without complication ([place])   . Hyperlipidemia   . Hypertension   . Hypothyroidism   . Meralgia paresthetica of right side 01/10/2017  . Migraine   . Pericardial effusion   . 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 APPENDECTOMY    . CYST REMOVAL LEG Right    Removed from right thigh joint  . ORIF HUMERUS FRACTURE Right 07/09/2018   Procedure: OPEN REDUCTION INTERNAL FIXATION (ORIF) HUMERAL SHAFT FRACTURE;  Surgeon: Hiram Gash, MD;  Location: [facility];  Service: Orthopedics;  Laterality: Right;  . TONSILLECTOMY    . TOTAL VAGINAL HYSTERECTOMY    . UMBILICAL HERNIA REPAIR      There were no vitals filed for this visit.  Subjective Assessment - 08/13/18 1108    Pertinent History  surgery RUE 07/09/18    Limitations  Pt is now cleared for ROM as tolerated    Currently in Pain?  Yes    Pain Score  2    up to 6/10 at times during session   Pain Location  Arm    Pain Orientation  Right    Pain Descriptors / Indicators  Aching;Dull    Pain Type  Acute pain    Pain Onset  1 to 4  weeks ago    Pain Frequency  Intermittent       Supine: BUE sh flex/ext with cane, then with ball. ER/IR first w/ elbows bent towards side, then with arms in abduction using cane. Horizontal abduction/adduction w/ cane (added to HEP). All ex's performed 5-10 reps as tolerated.  Seated: horizontal abduction x 5 reps to Rt side. Shoulder ladder x 5 full reps BUE's.  Standing: bilateral shoulder extension with cane, bilateral IR w/ cane (both added to HEP). Pt w/ increased pain during these ex's due to stiffness.  RUE AA/ROM in higher range sh. Flexion using UE Ranger x 5 reps Ice pack to Rt shoulder and upper arm x 10 min. At end of session                    OT Education - 08/13/18 1145    Education Details  updates to HEP for AA/ROM with cane    Person(s) Educated  Patient    Methods  Explanation;Demonstration;Handout    Comprehension  Verbalized understanding;Returned demonstration       OT Short Term Goals - 08/12/18 2012  OT SHORT TERM GOAL #1   Title  Patient will complete an HEP designed to increase elbow through hand AROM DUE 08/29/2018    Time  4    Period  Weeks    Status  On-going      OT SHORT TERM GOAL #2   Title  Patient will demonstrate knowledge and effective technique for scar mobilization and management    Time  4    Period  Weeks    Status  On-going      OT SHORT TERM GOAL #3   Title  Patient will demonstrate understanding of safe transition out of sling schedule    Time  4    Period  Weeks    Status  Achieved      OT SHORT TERM GOAL #4   Title  Patient will complete HEP designed for self ROM for right shoulder    Time  4    Period  Weeks    Status  Achieved      OT SHORT TERM GOAL #5   Title  Patient will incorporate right hand into daily functional tasks as able, incorporating elbow, flroearm, wrist and hand motion, e.g. writing, feeding, oral hygiene, aspects of bathing, dresisng    Time  4    Period  Weeks    Status  On-going         OT Long Term Goals - 08/12/18 2012      OT LONG TERM GOAL #1   Title  Patient will complete update HEP designed to improve AROM RUE DUE 09/28/18    Time  8    Period  Weeks    Status  New      OT LONG TERM GOAL #2   Title  Patient will demonstrate adequate shoulder range of motion and strength to obtain / place a lightweight (LESS THAN 2LB) object on chest height shelf with RUE    Time  8    Period  Weeks    Status  New      OT LONG TERM GOAL #3   Title  Patient will demonstrate adequate strength and range of motion to use a curling iron for hair care - one hand for comb, one hand for iron    Time  8    Period  Weeks    Status  New      OT LONG TERM GOAL #4   Title  Patient will demonstrate adequate strength and range of motion to apply and remove seat belt, includes buckle (driver's side or passenger side) without report of increased pain      OT LONG TERM GOAL #5   Title  Patient will demonstrate adequate range of motion and consistent pressure to use BUE's to guide material through sewing machine    Time  8    Period  Weeks    Status  New            Plan - 08/13/18 1150    Clinical Impression Statement  Pt gradually progressing with ROM RUE. Pt w/ increased pain above midrange and some sh. hiking    Occupational Profile and client history currently impacting functional performance  retired Pharmacist, hospital, Psychologist, occupational, enjoys chair yoga, Radiographer, therapeutic    Occupational performance deficits (Please refer to evaluation for details):  ADL's;IADL's;Rest and Sleep;Leisure;Social Participation    Rehab Potential  Good    OT Frequency  3x / week    OT Duration  4 weeks  OT Treatment/Interventions  Self-care/ADL training;Therapeutic exercise;Functional Mobility Training;Balance training;Aquatic Therapy;Ultrasound;Neuromuscular education;Manual Therapy;Splinting;Energy conservation;Therapeutic activities;Coping strategies training;DME and/or AE instruction;Cognitive  remediation/compensation;Electrical Stimulation;Scar mobilization;Moist Heat;Cryotherapy;Passive range of motion;Patient/family education    Plan  continue ROM as tolerated, ice for pain managment, light functional use       Patient will benefit from skilled therapeutic intervention in order to improve the following deficits and impairments:  Decreased endurance, Decreased skin integrity, Increased muscle spasms, Decreased coping skills, Decreased knowledge of precautions, Decreased scar mobility, Impaired perceived functional ability, Improper body mechanics, Decreased activity tolerance, Decreased knowledge of use of DME, Decreased strength, Impaired flexibility, Decreased balance, Decreased mobility, Difficulty walking, Decreased cognition, Decreased range of motion, Increased edema, Pain, Decreased coordination, Decreased safety awareness, Increased fascial restrictions, Impaired UE functional use  Visit Diagnosis: Stiffness of right shoulder, not elsewhere classified  Pain in right upper arm  Muscle weakness (generalized)    Problem List Patient Active Problem List   Diagnosis Date Noted  . Fall at home, initial encounter 07/06/2018  . Displaced spiral fracture of shaft of humerus, right arm, initial encounter for closed fracture 07/06/2018  . TIA (transient ischemic attack) 07/06/2018  . Allergic rhinitis 07/04/2018  . Normochromic normocytic anemia 01/20/2018  . Rheumatoid arthritis ([place]) 04/30/2017  . Lesion of left humerus 04/29/2017  . Pericardial effusion 03/14/2017  . Diabetes mellitus ([place]) 03/14/2017  . Essential hypertension 03/14/2017  . Mixed hyperlipidemia 03/14/2017  . 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 Meralgia paresthetica of right side 01/10/2017  . Allergic rhinoconjunctivitis 07/09/2015  . Asthma, well controlled 07/09/2015  . Laryngopharyngeal reflux (LPR) 07/09/2015    Carey Bullocks, OTR/L 08/13/2018, 12:01 PM  [facility] [address] [place], [place], [address] Phone: [phone]   Fax:  [phone]  Name: Jocelyn Kaufman MRN: [id number] Date of Birth: [birth year]

## 2018-08-13 NOTE — Patient Instructions (Signed)
ROM: Horizontal Abduction / Adduction - Wand    Keeping both palms down, push right hand across body with other hand. Then pull back across body, keeping arms parallel to floor. Do not allow trunk to twist.  Repeat __5__ times per set. Do _2___ sessions per day.    ROM: Extension - Wand (Standing)    Stand holding wand behind back. Palms facing forward. Raise arms as far as possible. Do not hike shoulders or lean forward Repeat __5-10__ times per set. Do __2__ sessions per day.  Chest / Shoulder Stretch: Yardstick Along Spine    Hold stick horizontally against buttocks, hands shoulder width apart. Bend elbows, and raise stick along spine. Hold 1 count. Slowly return to starting position. Repeat _5-10___ times.

## 2018-08-15 ENCOUNTER — Encounter: Payer: Self-pay | Admitting: Physical Therapy

## 2018-08-15 ENCOUNTER — Ambulatory Visit: Payer: BC Managed Care – PPO | Admitting: Physical Therapy

## 2018-08-15 DIAGNOSIS — R2681 Unsteadiness on feet: Secondary | ICD-10-CM

## 2018-08-15 DIAGNOSIS — Z9181 History of falling: Secondary | ICD-10-CM

## 2018-08-15 DIAGNOSIS — R42 Dizziness and giddiness: Secondary | ICD-10-CM

## 2018-08-15 NOTE — Therapy (Signed)
[facility] [address] [place], [place], [address] Phone: [phone]   Fax:  [phone]  Physical Therapy Treatment  Patient Details  Name: Jocelyn Kaufman MRN: [id number] Date of Birth: 06-Oct-1956 Referring Provider (PT): Dr. Shelia Media   Encounter Date: 08/15/2018  PT End of Session - 08/15/18 1101    Visit Number  6    Number of Visits  17    Date for PT Re-Evaluation  09/23/18    Authorization Type  BCBS    PT Start Time  1058    PT Stop Time  1138    PT Time Calculation (min)  40 min    Activity Tolerance  Patient tolerated treatment well    Behavior During Therapy  [facility] for tasks assessed/performed       Past Medical History:  Diagnosis Date  . Adrenal adenoma   . 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 Depression   . Diabetes mellitus without complication ([place])   . Hyperlipidemia   . Hypertension   . Hypothyroidism   . Meralgia paresthetica of right side 01/10/2017  . Migraine   . Pericardial effusion   . 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 APPENDECTOMY    . CYST REMOVAL LEG Right    Removed from right thigh joint  . ORIF HUMERUS FRACTURE Right 07/09/2018   Procedure: OPEN REDUCTION INTERNAL FIXATION (ORIF) HUMERAL SHAFT FRACTURE;  Surgeon: Hiram Gash, MD;  Location: [place];  Service: Orthopedics;  Laterality: Right;  . TONSILLECTOMY    . TOTAL VAGINAL HYSTERECTOMY    . UMBILICAL HERNIA REPAIR      There were no vitals filed for this visit.  Subjective Assessment - 08/15/18 1059    Subjective  doing well - no falls; no new complaints    Pertinent History  Cushings, DM, HTN, meralgia paresthetica, Migraine, pericardial effusion, TIA (at time of fall)    Patient Stated Goals  "fix the inner ear, reduce need for cane, feel comfortable with mobility"    Currently in Pain?  Yes    Pain Score  2     Pain Location  Arm    Pain Orientation  Right    Pain Descriptors / Indicators  Aching;Sore;Nagging    Pain Type  Acute pain                       [facility] Adult PT Treatment/Exercise - 08/15/18 0001      Exercises   Exercises  Other Exercises    Other Exercises   step ups: firm surface to 6" step x 12; foam surface to 6" step x 12; firm surface to 6" step + foam x 12          Balance Exercises - 08/15/18 1248      Balance Exercises: Standing   Rockerboard  Anterior/posterior;EO;20 seconds;5 reps   1/2 stagger stance focusing on weight shift   Other Standing Exercises  rockerboard A/P maintianing midlin with EC 3 x 30 sec; forward toe taps to 3 cones x 10 each LE          PT Short Term Goals - 07/29/18 1431      PT SHORT TERM GOAL #1   Title  patient to be independent with HEP for vestibular and balance activities    Time  4    Period  Weeks    Status  New  Target Date  08/26/18      PT SHORT TERM GOAL #2   Title  patient to improve DGI to >/= 15/24    Baseline  12/24    Time  4    Period  Weeks    Status  New    Target Date  08/26/18      PT SHORT TERM GOAL #3   Title  patient to improve gait speed to >/= 3.5 ft/sec with LRAD at Mod I level over level indoor/outdoor surfaces    Baseline  2.7 ft/sec    Time  4    Period  Weeks    Status  New    Target Date  08/26/18        PT Long Term Goals - 08/06/18 0938      PT LONG TERM GOAL #1   Title  patient to be independent with advanced HEP     Time  8    Period  Weeks    Status  New      PT LONG TERM GOAL #2   Title  patient to improve gait speed to >/= 4.37 ft/sec with LRAD at IND over various indoor and outdoor surfaces    Time  8    Period  Weeks    Status  New      PT LONG TERM GOAL #3   Title  patient to improve DGI to >/= 19/24 demonstrating reduced fall risk    Time  8    Period  Weeks    Status  New      PT LONG TERM GOAL #4   Title  patient to report reduction in symptom frequency, duration, intensity by >/= 50%    Time   8    Period  Weeks    Status  New      PT LONG TERM GOAL #5   Title  Patient to improve SOT composite score to WNL for age    Baseline  39    Time  8    Period  Weeks    Status  Revised            Plan - 08/15/18 1101    Clinical Impression Statement  Session today focusing on strengthening and balance. Patient reporting reduced confidence throughout session, however able to perform all activities without LOB or overt instability. Intermittent use of UE at // bars throughout session and does not need physical assist to maintain balance. Will begin assessing STG at next session.     Rehab Potential  Good    PT Frequency  2x / week    PT Duration  8 weeks    PT Treatment/Interventions  ADLs/Self Care Home Management;Canalith Repostioning;DME Instruction;Balance training;Therapeutic exercise;Therapeutic activities;Functional mobility training;Stair training;Gait training;Neuromuscular re-education;Patient/family education;Manual techniques;Passive range of motion;Vestibular;Visual/perceptual remediation/compensation    PT Next Visit Plan   progress horizontal VOR (incr head speed, ?move to standing), balance, gait. may need to test Berg; increase vestibular input with balance    PT Home Exercise Plan  [id number]    Consulted and Agree with Plan of Care  Patient       Patient will benefit from skilled therapeutic intervention in order to improve the following deficits and impairments:  Decreased activity tolerance, Dizziness, Pain, Postural dysfunction, Decreased strength, Decreased balance  Visit Diagnosis: Dizziness and giddiness  Unsteadiness on feet  History of falling     Problem List Patient Active Problem List   Diagnosis Date Noted  .  Fall at home, initial encounter 07/06/2018  . Displaced spiral fracture of shaft of humerus, right arm, initial encounter for closed fracture 07/06/2018  . TIA (transient ischemic attack) 07/06/2018  . Allergic rhinitis 07/04/2018  .  Normochromic normocytic anemia 01/20/2018  . Rheumatoid arthritis ([place]) 04/30/2017  . Lesion of left humerus 04/29/2017  . Pericardial effusion 03/14/2017  . Diabetes mellitus ([place]) 03/14/2017  . Essential hypertension 03/14/2017  . Mixed hyperlipidemia 03/14/2017  . 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 Meralgia paresthetica of right side 01/10/2017  . Allergic rhinoconjunctivitis 07/09/2015  . Asthma, well controlled 07/09/2015  . Laryngopharyngeal reflux (LPR) 07/09/2015    Lanney Gins, PT, DPT Supplemental Physical Therapist 08/15/18 12:53 PM Pager: [phone] Office: [facility] [facility] [address] [place], [place], [address] Phone: [phone]   Fax:  [phone]  Name: BRANDYN THIEN MRN: [id number] Date of Birth: May 29, 1956

## 2018-08-19 ENCOUNTER — Ambulatory Visit (INDEPENDENT_AMBULATORY_CARE_PROVIDER_SITE_OTHER): Payer: BC Managed Care – PPO | Admitting: *Deleted

## 2018-08-19 DIAGNOSIS — J309 Allergic rhinitis, unspecified: Secondary | ICD-10-CM | POA: Diagnosis not present

## 2018-08-20 ENCOUNTER — Ambulatory Visit: Payer: BC Managed Care – PPO | Admitting: Physical Therapy

## 2018-08-20 ENCOUNTER — Ambulatory Visit: Payer: BC Managed Care – PPO | Admitting: Occupational Therapy

## 2018-08-20 ENCOUNTER — Encounter: Payer: Self-pay | Admitting: Physical Therapy

## 2018-08-20 ENCOUNTER — Encounter: Payer: Self-pay | Admitting: Occupational Therapy

## 2018-08-20 DIAGNOSIS — R42 Dizziness and giddiness: Secondary | ICD-10-CM

## 2018-08-20 DIAGNOSIS — M79621 Pain in right upper arm: Secondary | ICD-10-CM

## 2018-08-20 DIAGNOSIS — R2681 Unsteadiness on feet: Secondary | ICD-10-CM

## 2018-08-20 DIAGNOSIS — I69815 Cognitive social or emotional deficit following other cerebrovascular disease: Secondary | ICD-10-CM

## 2018-08-20 DIAGNOSIS — Z9181 History of falling: Secondary | ICD-10-CM

## 2018-08-20 DIAGNOSIS — M25611 Stiffness of right shoulder, not elsewhere classified: Secondary | ICD-10-CM

## 2018-08-20 DIAGNOSIS — M6281 Muscle weakness (generalized): Secondary | ICD-10-CM

## 2018-08-20 NOTE — Therapy (Signed)
[facility] [address] [place], [place], [address] Phone: [phone]   Fax:  [phone]  Occupational Therapy Treatment  Patient Details  Name: Jocelyn Kaufman MRN: [id number] Date of Birth: [birth year] Referring Provider (OT): Deland Pretty   Encounter Date: 08/20/2018  OT End of Session - 08/20/18 1535    Visit Number  7    Number of Visits  21    Date for OT Re-Evaluation  09/28/18    Authorization Type  BCBS, VL=[place]    OT Start Time  1319    OT Stop Time  1400    OT Time Calculation (min)  41 min    Activity Tolerance  Patient tolerated treatment well    Behavior During Therapy  [facility] for tasks assessed/performed       Past Medical History:  Diagnosis Date  . Adrenal adenoma   . 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 Depression   . Diabetes mellitus without complication ([place])   . Hyperlipidemia   . Hypertension   . Hypothyroidism   . Meralgia paresthetica of right side 01/10/2017  . Migraine   . Pericardial effusion   . 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 APPENDECTOMY    . CYST REMOVAL LEG Right    Removed from right thigh joint  . ORIF HUMERUS FRACTURE Right 07/09/2018   Procedure: OPEN REDUCTION INTERNAL FIXATION (ORIF) HUMERAL SHAFT FRACTURE;  Surgeon: Hiram Gash, MD;  Location: [facility];  Service: Orthopedics;  Laterality: Right;  . TONSILLECTOMY    . TOTAL VAGINAL HYSTERECTOMY    . UMBILICAL HERNIA REPAIR      There were no vitals filed for this visit.  Subjective Assessment - 08/20/18 1527    Subjective   Patient reports she is moving arm more freely, but still guards it in crowded places.      Pertinent History  surgery RUE 07/09/18    Limitations  Pt is now cleared for ROM as tolerated    Currently in Pain?  No/denies    Pain Score  0-No pain                   OT Treatments/Exercises (OP) - 08/20/18 0001      ADLs   Leisure   Patient was able to return to jewelery making class, and although she felt her movements were slower than before, she continued to work to improve.  Patient also reports she went to church, choosing early service to avoid crowded situation.      ADL Comments  Discussion with patient about where she is functionally using her right hand.  Patient reports using her arm to bath, to dress, to wash dishes (lightweight) to style her hair, to hold steering wheel, adjust directionals, etc.  Encouraged her to use arm for tasks requiring bigger motions, e.g. removing bed linenes and replacing.        Exercises   Exercises  Shoulder      Shoulder Exercises: Supine   Flexion Limitations  170 AAROM - used breathing - exhalation to aide with range      Shoulder Exercises: Seated   Other Seated Exercises  Used mirror to allow patient to see amount of shoulder hiking withhorizontal abd/adduction cane exercise.  Patient encourgaed to work below 90 degrees of shoulder flexion initially to reduce scap elevation      Shoulder Exercises: Standing   Other Standing Exercises  Reviewed home exercises - especially relating to extension with cane behind back.               OT Education - 08/20/18 1535    Education Details  patient sees MD Friday - encouraged to ask about lifting restrictions    Person(s) Educated  Patient    Methods  Handout;Explanation    Comprehension  Verbalized understanding       OT Short Term Goals - 08/20/18 1540      OT SHORT TERM GOAL #1   Title  Patient will complete an HEP designed to increase elbow through hand AROM DUE 08/29/2018    Status  Achieved      OT SHORT TERM GOAL #2   Title  Patient will demonstrate knowledge and effective technique for scar mobilization and management    Status  Achieved      OT SHORT TERM GOAL #3   Title  Patient will demonstrate understanding of safe transition out of sling schedule    Status  Achieved      OT SHORT TERM GOAL #4   Title   Patient will complete HEP designed for self ROM for right shoulder    Status  Achieved      OT SHORT TERM GOAL #5   Title  Patient will incorporate right hand into daily functional tasks as able, incorporating elbow, flroearm, wrist and hand motion, e.g. writing, feeding, oral hygiene, aspects of bathing, dresisng    Status  Achieved        OT Long Term Goals - 08/20/18 1540      OT [place] #1   Title  Patient will complete update HEP designed to improve AROM RUE DUE 09/28/18    Status  On-going      OT LONG TERM GOAL #2   Title  Patient will demonstrate adequate shoulder range of motion and strength to obtain / place a lightweight (LESS THAN 2LB) object on chest height shelf with RUE    Status  On-going      OT LONG TERM GOAL #3   Title  Patient will demonstrate adequate strength and range of motion to use a curling iron for hair care - one hand for comb, one hand for iron    Status  On-going      OT LONG TERM GOAL #4   Title  Patient will demonstrate adequate strength and range of motion to apply and remove seat belt, includes buckle (driver's side or passenger side) without report of increased pain    Status  On-going      OT LONG TERM GOAL #5   Title  Patient will demonstrate adequate range of motion and consistent pressure to use BUE's to guide material through sewing machine    Status  On-going            Plan - 08/20/18 1536    Clinical Impression Statement  Patient making steady improvement with range of motion and functional use of RUE.      Occupational Profile and client history currently impacting functional performance  retired Pharmacist, hospital, Psychologist, occupational, enjoys chair yoga, Radiographer, therapeutic    Occupational performance deficits (Please refer to evaluation for details):  ADL's;IADL's;Rest and Sleep;Leisure;Social Participation    Rehab Potential  Good    Current Impairments/barriers affecting progress:  pain, fear, need to establish trust     OT Frequency   3x / week    OT Duration  4 weeks    OT  Treatment/Interventions  Self-care/ADL training;Therapeutic exercise;Functional Mobility Training;Balance training;Aquatic Therapy;Ultrasound;Neuromuscular education;Manual Therapy;Splinting;Energy conservation;Therapeutic activities;Coping strategies training;DME and/or AE instruction;Cognitive remediation/compensation;Electrical Stimulation;Scar mobilization;Moist Heat;Cryotherapy;Passive range of motion;Patient/family education    Plan  continue ROM as tolerated, ice for pain managment, light functional use    Clinical Decision Making  Several treatment options, min-mod task modification necessary    Consulted and Agree with Plan of Care  Patient       Patient will benefit from skilled therapeutic intervention in order to improve the following deficits and impairments:  Decreased endurance, Decreased skin integrity, Increased muscle spasms, Decreased coping skills, Decreased knowledge of precautions, Decreased scar mobility, Impaired perceived functional ability, Improper body mechanics, Decreased activity tolerance, Decreased knowledge of use of DME, Decreased strength, Impaired flexibility, Decreased balance, Decreased mobility, Difficulty walking, Decreased cognition, Decreased range of motion, Increased edema, Pain, Decreased coordination, Decreased safety awareness, Increased fascial restrictions, Impaired UE functional use  Visit Diagnosis: Unsteadiness on feet  Stiffness of right shoulder, not elsewhere classified  Pain in right upper arm  Muscle weakness (generalized)  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 Fall at home, initial encounter 07/06/2018  . Displaced spiral fracture of shaft of humerus, right arm, initial encounter for closed fracture 07/06/2018  . TIA (transient ischemic attack) 07/06/2018  . Allergic rhinitis  07/04/2018  . Normochromic normocytic anemia 01/20/2018  . Rheumatoid arthritis ([place]) 04/30/2017  . Lesion of left humerus 04/29/2017  . Pericardial effusion 03/14/2017  . Diabetes mellitus ([place]) 03/14/2017  . Essential hypertension 03/14/2017  . Mixed hyperlipidemia 03/14/2017  . 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 Meralgia paresthetica of right side 01/10/2017  . Allergic rhinoconjunctivitis 07/09/2015  . Asthma, well controlled 07/09/2015  . Laryngopharyngeal reflux (LPR) 07/09/2015    Mariah Milling, OTR/L 08/20/2018, 3:42 PM  [facility] [address] [place], [place], [address] Phone: [phone]   Fax:  [phone]  Name: Jocelyn Kaufman MRN: [id number] Date of Birth: 08/08/1956

## 2018-08-20 NOTE — Therapy (Signed)
[facility] [address] [place], [place], [address] Phone: [phone]   Fax:  [phone]  Physical Therapy Treatment  Patient Details  Name: Jocelyn Kaufman MRN: [id number] Date of Birth: 10/23/1956 Referring Provider (PT): Dr. Shelia Media   Encounter Date: 08/20/2018  PT End of Session - 08/20/18 1633    Visit Number  7    Number of Visits  17    Date for PT Re-Evaluation  09/23/18    Authorization Type  BCBS    PT Start Time  1402    PT Stop Time  1445    PT Time Calculation (min)  43 min    Equipment Utilized During Treatment  Gait belt    Activity Tolerance  Patient tolerated treatment well    Behavior During Therapy  [facility] for tasks assessed/performed       Past Medical History:  Diagnosis Date  . Adrenal adenoma   . 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 Depression   . Diabetes mellitus without complication ([place])   . Hyperlipidemia   . Hypertension   . Hypothyroidism   . Meralgia paresthetica of right side 01/10/2017  . Migraine   . Pericardial effusion   . 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 APPENDECTOMY    . CYST REMOVAL LEG Right    Removed from right thigh joint  . ORIF HUMERUS FRACTURE Right 07/09/2018   Procedure: OPEN REDUCTION INTERNAL FIXATION (ORIF) HUMERAL SHAFT FRACTURE;  Surgeon: Hiram Gash, MD;  Location: [facility];  Service: Orthopedics;  Laterality: Right;  . TONSILLECTOMY    . TOTAL VAGINAL HYSTERECTOMY    . UMBILICAL HERNIA REPAIR      There were no vitals filed for this visit.  Subjective Assessment - 08/20/18 1629    Subjective  doing well - was able to attend church this past wekeend with no issues    Pertinent History  Cushings, DM, HTN, meralgia paresthetica, Migraine, pericardial effusion, TIA (at time of fall)    Patient Stated Goals  "fix the inner ear, reduce need for cane, feel comfortable with mobility"    Currently in  Pain?  No/denies    Pain Score  0-No pain                       [facility] Adult PT Treatment/Exercise - 08/20/18 1631      Neuro Re-ed    Neuro Re-ed Details   standing on ramp with quick head turns: vertical and horizontal 3 x 20 sec each; gait with gaze stabilization on ball CW/CCW x 20 feet each; standing with quick trunk rotations x 10 each side; standing with forward bend x 10; gait with horizontal and vertical head turns x 1 lap around PT gym each, gait with dual task naming animals with 2 laps around PT gym; seated on blue physioball with alternating LAQ and marches x 10 each; seated on blue physioball with perturbations by PT x 2 min - all activities performed without AD or UE support.                PT Short Term Goals - 07/29/18 1431      PT SHORT TERM GOAL #1   Title  patient to be independent with HEP for vestibular and balance activities    Time  4    Period  Weeks    Status  New  Target Date  08/26/18      PT SHORT TERM GOAL #2   Title  patient to improve DGI to >/= 15/24    Baseline  12/24    Time  4    Period  Weeks    Status  New    Target Date  08/26/18      PT SHORT TERM GOAL #3   Title  patient to improve gait speed to >/= 3.5 ft/sec with LRAD at Mod I level over level indoor/outdoor surfaces    Baseline  2.7 ft/sec    Time  4    Period  Weeks    Status  New    Target Date  08/26/18        PT Long Term Goals - 08/06/18 0938      PT LONG TERM GOAL #1   Title  patient to be independent with advanced HEP     Time  8    Period  Weeks    Status  New      PT LONG TERM GOAL #2   Title  patient to improve gait speed to >/= 4.37 ft/sec with LRAD at IND over various indoor and outdoor surfaces    Time  8    Period  Weeks    Status  New      PT LONG TERM GOAL #3   Title  patient to improve DGI to >/= 19/24 demonstrating reduced fall risk    Time  8    Period  Weeks    Status  New      PT LONG TERM GOAL #4   Title  patient to  report reduction in symptom frequency, duration, intensity by >/= 50%    Time  8    Period  Weeks    Status  New      PT LONG TERM GOAL #5   Title  Patient to improve SOT composite score to WNL for age    Baseline  53    Time  8    Period  Weeks    Status  Revised            Plan - 08/20/18 1634    Clinical Impression Statement  All activities in session today able to be performed without AD or UE support demonstrating improve dynamic balance. Challenged balance with head turns, dual task, and on various surfaces with patient doing well - does have intermittent bouts of dizziness, however no need for physical assist for steadying. Discussed VOR x 1 progression today at home - progresed to standing with understanding ot perform in corner with optimal safety precautions. Continues to make good progress towards goals.     Rehab Potential  Good    PT Frequency  2x / week    PT Duration  8 weeks    PT Treatment/Interventions  ADLs/Self Care Home Management;Canalith Repostioning;DME Instruction;Balance training;Therapeutic exercise;Therapeutic activities;Functional mobility training;Stair training;Gait training;Neuromuscular re-education;Patient/family education;Manual techniques;Passive range of motion;Vestibular;Visual/perceptual remediation/compensation    PT Next Visit Plan   progress horizontal VOR (incr head speed, ?move to standing), balance, gait. may need to test Berg; increase vestibular input with balance    PT Home Exercise Plan  [id number]    Consulted and Agree with Plan of Care  Patient       Patient will benefit from skilled therapeutic intervention in order to improve the following deficits and impairments:  Decreased activity tolerance, Dizziness, Pain, Postural dysfunction, Decreased strength, Decreased balance  Visit Diagnosis:  Dizziness and giddiness  Unsteadiness on feet  History of falling     Problem List Patient Active Problem List   Diagnosis Date Noted   . Fall at home, initial encounter 07/06/2018  . Displaced spiral fracture of shaft of humerus, right arm, initial encounter for closed fracture 07/06/2018  . TIA (transient ischemic attack) 07/06/2018  . Allergic rhinitis 07/04/2018  . Normochromic normocytic anemia 01/20/2018  . Rheumatoid arthritis ([place]) 04/30/2017  . Lesion of left humerus 04/29/2017  . Pericardial effusion 03/14/2017  . Diabetes mellitus ([place]) 03/14/2017  . Essential hypertension 03/14/2017  . Mixed hyperlipidemia 03/14/2017  . 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 Meralgia paresthetica of right side 01/10/2017  . Allergic rhinoconjunctivitis 07/09/2015  . Asthma, well controlled 07/09/2015  . Laryngopharyngeal reflux (LPR) 07/09/2015     Lanney Gins, PT, DPT Supplemental Physical Therapist 08/20/18 4:37 PM Pager: [phone] Office: [facility] [address] [place], [place], [address] Phone: [phone]   Fax:  [phone]  Name: Jocelyn Kaufman MRN: [id number] Date of Birth: 1956-10-20

## 2018-08-22 ENCOUNTER — Ambulatory Visit: Payer: BC Managed Care – PPO | Admitting: Occupational Therapy

## 2018-08-22 ENCOUNTER — Ambulatory Visit: Payer: BC Managed Care – PPO | Admitting: Physical Therapy

## 2018-08-26 ENCOUNTER — Ambulatory Visit: Payer: BC Managed Care – PPO | Admitting: Occupational Therapy

## 2018-08-26 ENCOUNTER — Encounter: Payer: Self-pay | Admitting: Physical Therapy

## 2018-08-26 ENCOUNTER — Ambulatory Visit: Payer: BC Managed Care – PPO | Admitting: Physical Therapy

## 2018-08-26 DIAGNOSIS — M79621 Pain in right upper arm: Secondary | ICD-10-CM

## 2018-08-26 DIAGNOSIS — R42 Dizziness and giddiness: Secondary | ICD-10-CM

## 2018-08-26 DIAGNOSIS — R2681 Unsteadiness on feet: Secondary | ICD-10-CM

## 2018-08-26 DIAGNOSIS — I69815 Cognitive social or emotional deficit following other cerebrovascular disease: Secondary | ICD-10-CM

## 2018-08-26 DIAGNOSIS — M25611 Stiffness of right shoulder, not elsewhere classified: Secondary | ICD-10-CM

## 2018-08-26 DIAGNOSIS — M6281 Muscle weakness (generalized): Secondary | ICD-10-CM

## 2018-08-26 NOTE — Patient Instructions (Signed)
1. Grip Strengthening (Resistive Putty)   Squeeze putty using thumb and all fingers. Repeat _20___ times. Do __2__ sessions per day.   2. Roll putty into tube on table and pinch between each finger and thumb x 10 reps each. (can do ring and small finger together)     Copyright  VHI. All rights reserved.   

## 2018-08-26 NOTE — Therapy (Signed)
[facility] [address] [place], [place], [address] Phone: [phone]   Fax:  [phone]  Physical Therapy Treatment  Patient Details  Name: Jocelyn Kaufman MRN: [id number] Date of Birth: [birth year] Referring Provider (PT): Dr. Shelia Media   Encounter Date: 08/26/2018  PT End of Session - 08/26/18 1539    Visit Number  8    Number of Visits  17    Date for PT Re-Evaluation  09/23/18    Authorization Type  BCBS    PT Start Time  1400    PT Stop Time  1445    PT Time Calculation (min)  45 min    Activity Tolerance  Patient tolerated treatment well    Behavior During Therapy  [facility] for tasks assessed/performed       Past Medical History:  Diagnosis Date  . Adrenal adenoma   . 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 Depression   . Diabetes mellitus without complication ([place])   . Hyperlipidemia   . Hypertension   . Hypothyroidism   . Meralgia paresthetica of right side 01/10/2017  . Migraine   . Pericardial effusion   . 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 APPENDECTOMY    . CYST REMOVAL LEG Right    Removed from right thigh joint  . ORIF HUMERUS FRACTURE Right 07/09/2018   Procedure: OPEN REDUCTION INTERNAL FIXATION (ORIF) HUMERAL SHAFT FRACTURE;  Surgeon: Hiram Gash, MD;  Location: [facility];  Service: Orthopedics;  Laterality: Right;  . TONSILLECTOMY    . TOTAL VAGINAL HYSTERECTOMY    . UMBILICAL HERNIA REPAIR      There were no vitals filed for this visit.  Subjective Assessment - 08/26/18 1404    Subjective  Reports experiencing a blood vessel burst in her L eye and continues to experience grey and black "floaters." Began to start using her cane s/p blood vessel burst for safety. Has not experienced any falls recently.     Pertinent History  Cushings, DM, HTN, meralgia paresthetica, Migraine, pericardial effusion, TIA (at time of fall)    Limitations   Standing;Walking    Patient Stated Goals  "fix the inner ear, reduce need for cane, feel comfortable with mobility"    Currently in Pain?  No/denies         [facility] PT Assessment - 08/26/18 1409      Dynamic Gait Index   Level Surface  Normal    Change in Gait Speed  Mild Impairment    Gait with Horizontal Head Turns  Mild Impairment    Gait with Vertical Head Turns  Mild Impairment    Gait and Pivot Turn  Moderate Impairment    Step Over Obstacle  Mild Impairment    Step Around Obstacles  Normal    Steps  Mild Impairment    Total Score  17    DGI comment:  17/24         [facility] Adult PT Treatment/Exercise - 08/26/18 1533      Ambulation/Gait   Ambulation/Gait  Yes    Ambulation/Gait Assistance  5: Supervision;4: Min guard    Ambulation/Gait Assistance Details  Pt performs ambulation trial outdoors with no AD navigating uneven terrain, curbs, ramps, and demonstrates ability to vary gait speed when cued. Patient performs head turns in all directions for increased challenge with therapist cueing to maintain forward gaze. Pt reports increase in dizziness with vertical head nods  2/2 to light sensitivity and occassional symptoms when ambulating up hill requiring therapist to provide min guard for safety.    Ambulation Distance (Feet)  500 Feet    Assistive device  None    Gait Pattern  Step-through pattern;Decreased stride length;Trunk flexed    Ambulation Surface  Level;Indoor;Outdoor;Paved;Unlevel    Gait velocity  3.93 ft/sec with no AD indicative of community ambulator    Stairs  Yes    Stairs Assistance  5: Supervision    Stairs Assistance Details (indicate cue type and reason)  Pt performs initial stair trial with single HR and reciprocal stepping technique ascending and step to pattern descending. Therapist cued patient to attempt to perform stair negotiation with recirpocal stepping when descending with patient able to perform.    Stair Management Technique  One rail  Right;Alternating pattern;Forwards;Step to pattern    Number of Stairs  4   x2 attempts   Height of Stairs  6    Curb  5: Supervision      Vestibular Treatment/Exercise - 08/26/18 1537      Vestibular Treatment/Exercise   Vestibular Treatment Provided  Gaze    Gaze Exercises  X1 Viewing Horizontal      X1 Viewing Horizontal   Foot Position  seated    Time  --   30 seconds    Reps  3    Comments  mild increase in dizziness symptoms with exercise performance            PT Education - 08/26/18 1538    Education Details  Therapist provided education regarding clinical findings for STG reassessment and revised gaze stabilization exercise in seated position 2/2 to increased symptoms during standing.     Person(s) Educated  Patient    Methods  Explanation    Comprehension  Verbalized understanding;Returned demonstration       PT Short Term Goals - 08/26/18 1418      PT SHORT TERM GOAL #1   Title  patient to be independent with HEP for vestibular and balance activities    Baseline  10/29: Patient reports that she was performing her HEP exercises before a blood vessel burst in her L eye on 10/25    Time  4    Period  Weeks    Status  Achieved      PT SHORT TERM GOAL #2   Title  patient to improve DGI to >/= 15/24    Baseline  10/29: 17/24 indicative of high fall risk    Time  4    Period  Weeks    Status  Achieved      PT SHORT TERM GOAL #3   Title  patient to improve gait speed to >/= 3.5 ft/sec with LRAD at Mod I level over level indoor/outdoor surfaces    Baseline  10/29: 3.93 ft/sec with no AD    Time  4    Period  Weeks    Status  Achieved        PT Long Term Goals - 08/06/18 8768      PT LONG TERM GOAL #1   Title  patient to be independent with advanced HEP     Time  8    Period  Weeks    Status  New      PT LONG TERM GOAL #2   Title  patient to improve gait speed to >/= 4.37 ft/sec with LRAD at IND over various indoor and outdoor surfaces    Time  8    Period  Weeks    Status  New      PT LONG TERM GOAL #3   Title  patient to improve DGI to >/= 19/24 demonstrating reduced fall risk    Time  8    Period  Weeks    Status  New      PT LONG TERM GOAL #4   Title  patient to report reduction in symptom frequency, duration, intensity by >/= 50%    Time  8    Period  Weeks    Status  New      PT LONG TERM GOAL #5   Title  Patient to improve SOT composite score to WNL for age    Baseline  56    Time  8    Period  Weeks    Status  Revised            Plan - 08/26/18 1541    Clinical Impression Statement  Today's skilled session focused on reassessment of patient STG's and revising patient gaze stabilization exercise 2/2 to change in status. Overall patient tolerated session well and met 3/3 STG's indicating improvement in safety during community ambulation, reduction in fall risk potential indicated by DGI improvement, and independence with HEP. Therapist revised patient gaze stabilization exercise 2/2 to patient reports of experiencing a blood vessel rupture to L eye creating visual obstructions in which she refers to as "floaters." Pt reports increased challenge performing gaze stabilization exercises in standing 2/2 to significant onset of dizziness and nausea which she attributes to her blood vessel rupture. Based on these findings, therapist modified exercise to be performed in sitting with patient demonstrating and verbalizing understanding.    Rehab Potential  Good    PT Frequency  2x / week    PT Duration  8 weeks    PT Treatment/Interventions  ADLs/Self Care Home Management;Canalith Repostioning;DME Instruction;Balance training;Therapeutic exercise;Therapeutic activities;Functional mobility training;Stair training;Gait training;Neuromuscular re-education;Patient/family education;Manual techniques;Passive range of motion;Vestibular;Visual/perceptual remediation/compensation    PT Next Visit Plan   how is L eye doing/ visual  obstructions, progress horizontal VOR (incr head speed, ?move to standing), balance, gait. may need to test Berg; increase vestibular input with balance    PT Home Exercise Plan  [id number]    Consulted and Agree with Plan of Care  Patient       Patient will benefit from skilled therapeutic intervention in order to improve the following deficits and impairments:  Decreased activity tolerance, Dizziness, Pain, Postural dysfunction, Decreased strength, Decreased balance  Visit Diagnosis: Unsteadiness on feet  Dizziness and giddiness     Problem List Patient Active Problem List   Diagnosis Date Noted  . Fall at home, initial encounter 07/06/2018  . Displaced spiral fracture of shaft of humerus, right arm, initial encounter for closed fracture 07/06/2018  . TIA (transient ischemic attack) 07/06/2018  . Allergic rhinitis 07/04/2018  . Normochromic normocytic anemia 01/20/2018  . Rheumatoid arthritis ([place]) 04/30/2017  . Lesion of left humerus 04/29/2017  . Pericardial effusion 03/14/2017  . Diabetes mellitus ([place]) 03/14/2017  . Essential hypertension 03/14/2017  . Mixed hyperlipidemia 03/14/2017  . 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 Meralgia paresthetica of right side 01/10/2017  . Allergic rhinoconjunctivitis 07/09/2015  . Asthma, well controlled 07/09/2015  . Laryngopharyngeal reflux (LPR) 07/09/2015    Moshe Salisbury Idamae Coccia,SPT 08/26/2018, 3:45 PM  [facility] [address] [place], [place], [address] Phone: [phone]   Fax:  [phone]  Name: PRECILLA PURNELL MRN: [id number] Date of Birth: 09-Apr-1956

## 2018-08-27 ENCOUNTER — Encounter: Payer: Self-pay | Admitting: Occupational Therapy

## 2018-08-27 ENCOUNTER — Encounter: Payer: Self-pay | Admitting: Rehabilitative and Restorative Service Providers"

## 2018-08-27 ENCOUNTER — Ambulatory Visit: Payer: BC Managed Care – PPO | Admitting: Occupational Therapy

## 2018-08-27 ENCOUNTER — Ambulatory Visit: Payer: BC Managed Care – PPO | Admitting: Rehabilitative and Restorative Service Providers"

## 2018-08-27 VITALS — BP 122/68

## 2018-08-27 DIAGNOSIS — M79621 Pain in right upper arm: Secondary | ICD-10-CM

## 2018-08-27 DIAGNOSIS — Z9181 History of falling: Secondary | ICD-10-CM

## 2018-08-27 DIAGNOSIS — M25611 Stiffness of right shoulder, not elsewhere classified: Secondary | ICD-10-CM

## 2018-08-27 DIAGNOSIS — I69815 Cognitive social or emotional deficit following other cerebrovascular disease: Secondary | ICD-10-CM

## 2018-08-27 DIAGNOSIS — R2681 Unsteadiness on feet: Secondary | ICD-10-CM

## 2018-08-27 DIAGNOSIS — R42 Dizziness and giddiness: Secondary | ICD-10-CM

## 2018-08-27 DIAGNOSIS — M6281 Muscle weakness (generalized): Secondary | ICD-10-CM

## 2018-08-27 NOTE — Therapy (Signed)
[facility] [address] [place], [place], [address] Phone: [phone]   Fax:  [phone]  Occupational Therapy Treatment  Patient Details  Name: Jocelyn Kaufman MRN: [id number] Date of Birth: Apr 15, 1956 Referring Provider (OT): Deland Pretty   Encounter Date: 08/27/2018  OT End of Session - 08/27/18 1400    Visit Number  9    Number of Visits  21    Date for OT Re-Evaluation  09/28/18    Authorization Type  BCBS, VL=[place]    OT Start Time  1321   2 units only ice pack at end of session   OT Stop Time  1400    OT Time Calculation (min)  39 min    Activity Tolerance  Patient tolerated treatment well    Behavior During Therapy  [facility] for tasks assessed/performed       Past Medical History:  Diagnosis Date  . Adrenal adenoma   . 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 Depression   . Diabetes mellitus without complication ([place])   . Hyperlipidemia   . Hypertension   . Hypothyroidism   . Meralgia paresthetica of right side 01/10/2017  . Migraine   . Pericardial effusion   . 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 APPENDECTOMY    . CYST REMOVAL LEG Right    Removed from right thigh joint  . ORIF HUMERUS FRACTURE Right 07/09/2018   Procedure: OPEN REDUCTION INTERNAL FIXATION (ORIF) HUMERAL SHAFT FRACTURE;  Surgeon: Hiram Gash, MD;  Location: [facility];  Service: Orthopedics;  Laterality: Right;  . TONSILLECTOMY    . TOTAL VAGINAL HYSTERECTOMY    . UMBILICAL HERNIA REPAIR      There were no vitals filed for this visit.  Subjective Assessment - 08/27/18 1609    Subjective   Pt reports increased shoulder pain today    Limitations  Pt is now cleared for ROM as tolerated, and gentle strengthening as tolerated    Currently in Pain?  Yes    Pain Score  5    following exercises   Pain Location  Arm    Pain Orientation  Right    Pain Descriptors / Indicators  Aching    Pain  Type  Surgical pain    Pain Onset  More than a month ago    Pain Frequency  Intermittent    Aggravating Factors   exercises    Pain Relieving Factors  rest, meds            Treatment: Supine closed chain shoulder flexion and chest press, then unilateral shoulder flexion and circumduction with 1 lbs weight, and then triceps extension A/ROM ,  min facilitation/ v.c AA/ROM shoulder abduction and shoulder flexion within tolerated ROM. Arm bike x 5 mins level 1 for conditioning Ice pack to shoulder and upper arm x 8 mins end of session,for pain relief  no adverse reactions                OT Short Term Goals - 08/20/18 1540      OT SHORT TERM GOAL #1   Title  Patient will complete an HEP designed to increase elbow through hand AROM DUE 08/29/2018    Status  Achieved      OT SHORT TERM GOAL #2   Title  Patient will demonstrate knowledge and effective technique for scar mobilization and management    Status  Achieved  OT SHORT TERM GOAL #3   Title  Patient will demonstrate understanding of safe transition out of sling schedule    Status  Achieved      OT SHORT TERM GOAL #4   Title  Patient will complete HEP designed for self ROM for right shoulder    Status  Achieved      OT SHORT TERM GOAL #5   Title  Patient will incorporate right hand into daily functional tasks as able, incorporating elbow, flroearm, wrist and hand motion, e.g. writing, feeding, oral hygiene, aspects of bathing, dresisng    Status  Achieved        OT Long Term Goals - 08/20/18 1540      OT LONG TERM GOAL #1   Title  Patient will complete update HEP designed to improve AROM RUE DUE 09/28/18    Status  On-going      OT LONG TERM GOAL #2   Title  Patient will demonstrate adequate shoulder range of motion and strength to obtain / place a lightweight (LESS THAN 2LB) object on chest height shelf with RUE    Status  On-going      OT LONG TERM GOAL #3   Title  Patient will demonstrate  adequate strength and range of motion to use a curling iron for hair care - one hand for comb, one hand for iron    Status  On-going      OT LONG TERM GOAL #4   Title  Patient will demonstrate adequate strength and range of motion to apply and remove seat belt, includes buckle (driver's side or passenger side) without report of increased pain    Status  On-going      OT LONG TERM GOAL #5   Title  Patient will demonstrate adequate range of motion and consistent pressure to use BUE's to guide material through sewing machine    Status  On-going            Plan - 08/27/18 1603    Clinical Impression Statement  Pt is progressing towards goals. She demonstrates improving ROM and strength. Pt was expering more pain today, therefore weighted exercises were not issued as HEP.    Occupational Profile and client history currently impacting functional performance  retired Pharmacist, hospital, Psychologist, occupational, enjoys chair yoga, Radiographer, therapeutic    Occupational performance deficits (Please refer to evaluation for details):  ADL's;IADL's;Rest and Sleep;Leisure;Social Participation    OT Frequency  2x / week    OT Duration  8 weeks    OT Treatment/Interventions  Self-care/ADL training;Therapeutic exercise;Functional Mobility Training;Balance training;Aquatic Therapy;Ultrasound;Neuromuscular education;Manual Therapy;Splinting;Energy conservation;Therapeutic activities;Coping strategies training;DME and/or AE instruction;Cognitive remediation/compensation;Electrical Stimulation;Scar mobilization;Moist Heat;Cryotherapy;Passive range of motion;Patient/family education    Plan  continue ROM, and light strengthening, issue updated HEP when possible    Consulted and Agree with Plan of Care  Patient       Patient will benefit from skilled therapeutic intervention in order to improve the following deficits and impairments:  Decreased endurance, Decreased skin integrity, Increased muscle spasms, Decreased coping skills,  Decreased knowledge of precautions, Decreased scar mobility, Improper body mechanics, Decreased activity tolerance, Decreased knowledge of use of DME, Decreased strength, Impaired flexibility, Decreased balance, Decreased mobility, Difficulty walking, Decreased cognition, Decreased range of motion, Increased edema, Pain, Decreased coordination, Decreased safety awareness, Increased fascial restrictions, Impaired UE functional use  Visit Diagnosis: Stiffness of right shoulder, not elsewhere classified  Pain in right upper arm  Muscle weakness (generalized)  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 Fall at home, initial encounter 07/06/2018  . Displaced spiral fracture of shaft of humerus, right arm, initial encounter for closed fracture 07/06/2018  . TIA (transient ischemic attack) 07/06/2018  . Allergic rhinitis 07/04/2018  . Normochromic normocytic anemia 01/20/2018  . Rheumatoid arthritis ([place]) 04/30/2017  . Lesion of left humerus 04/29/2017  . Pericardial effusion 03/14/2017  . Diabetes mellitus ([place]) 03/14/2017  . Essential hypertension 03/14/2017  . Mixed hyperlipidemia 03/14/2017  . 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 Meralgia paresthetica of right side 01/10/2017  . Allergic rhinoconjunctivitis 07/09/2015  . Asthma, well controlled 07/09/2015  . Laryngopharyngeal reflux (LPR) 07/09/2015    Livana Yerian 08/27/2018, 4:11 PM  [facility] [address] [place], [place], [address] Phone: [phone]   Fax:  [phone]  Name: BOOTS MCGLOWN MRN: [id number] Date of Birth: [birth year]

## 2018-08-27 NOTE — Therapy (Signed)
[facility] [address] [place], [place], [address] Phone: [phone]   Fax:  [phone]  Occupational Therapy Treatment  Patient Details  Name: Jocelyn Kaufman MRN: [id number] Date of Birth: 09/13/1956 Referring Provider (OT): Deland Pretty   Encounter Date: 08/26/2018  OT End of Session - 08/26/18 1526    Visit Number  8    Number of Visits  21    Authorization Type  BCBS, VL=[place]    OT Start Time  1451    OT Stop Time  1530    OT Time Calculation (min)  39 min    Activity Tolerance  Patient tolerated treatment well    Behavior During Therapy  [facility] for tasks assessed/performed       Past Medical History:  Diagnosis Date  . Adrenal adenoma   . 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 Depression   . Diabetes mellitus without complication ([place])   . Hyperlipidemia   . Hypertension   . Hypothyroidism   . Meralgia paresthetica of right side 01/10/2017  . Migraine   . Pericardial effusion   . 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 APPENDECTOMY    . CYST REMOVAL LEG Right    Removed from right thigh joint  . ORIF HUMERUS FRACTURE Right 07/09/2018   Procedure: OPEN REDUCTION INTERNAL FIXATION (ORIF) HUMERAL SHAFT FRACTURE;  Surgeon: Hiram Gash, MD;  Location: [facility];  Service: Orthopedics;  Laterality: Right;  . TONSILLECTOMY    . TOTAL VAGINAL HYSTERECTOMY    . UMBILICAL HERNIA REPAIR      There were no vitals filed for this visit.  Subjective Assessment - 08/26/18 1525    Pertinent History  surgery RUE 07/09/18    Limitations  Pt is now cleared for ROM as tolerated, and gentle strengthening as tolerated    Currently in Pain?  No/denies                  Treatment: Supine closed chain shoulder flexion and chest press followed by closed chain shoulder flexion and chest press with 4 lbs weight, then unilateral shoulder flexion with 1 lbs weight, and  triceps extension min facilitation/ v.c Seated mid range closed chain shoulder flexion, and abduction, min v.c to avoid compensation. Arm bike x 5 mins level 1 for conditioning.          OT Education - 08/27/18 0916    Education Details  red putty HEP, see pt instructions    Person(s) Educated  Patient    Methods  Handout;Explanation    Comprehension  Verbalized understanding       OT Short Term Goals - 08/20/18 1540      OT SHORT TERM GOAL #1   Title  Patient will complete an HEP designed to increase elbow through hand AROM DUE 08/29/2018    Status  Achieved      OT SHORT TERM GOAL #2   Title  Patient will demonstrate knowledge and effective technique for scar mobilization and management    Status  Achieved      OT SHORT TERM GOAL #3   Title  Patient will demonstrate understanding of safe transition out of sling schedule    Status  Achieved      OT SHORT TERM GOAL #4   Title  Patient will complete HEP designed for self ROM for right shoulder    Status  Achieved  OT SHORT TERM GOAL #5   Title  Patient will incorporate right hand into daily functional tasks as able, incorporating elbow, flroearm, wrist and hand motion, e.g. writing, feeding, oral hygiene, aspects of bathing, dresisng    Status  Achieved        OT Long Term Goals - 08/20/18 1540      OT [place] #1   Title  Patient will complete update HEP designed to improve AROM RUE DUE 09/28/18    Status  On-going      OT LONG TERM GOAL #2   Title  Patient will demonstrate adequate shoulder range of motion and strength to obtain / place a lightweight (LESS THAN 2LB) object on chest height shelf with RUE    Status  On-going      OT LONG TERM GOAL #3   Title  Patient will demonstrate adequate strength and range of motion to use a curling iron for hair care - one hand for comb, one hand for iron    Status  On-going      OT LONG TERM GOAL #4   Title  Patient will demonstrate adequate strength and  range of motion to apply and remove seat belt, includes buckle (driver's side or passenger side) without report of increased pain    Status  On-going      OT LONG TERM GOAL #5   Title  Patient will demonstrate adequate range of motion and consistent pressure to use BUE's to guide material through sewing machine    Status  On-going            Plan - 08/27/18 0917    Clinical Impression Statement  Patient making steady improvement with range of motion and functional use of RUE.  she was cleared by MD to begin working with weights as tolerated.    Occupational Profile and client history currently impacting functional performance  retired Pharmacist, hospital, Psychologist, occupational, enjoys chair yoga, Radiographer, therapeutic    Occupational performance deficits (Please refer to evaluation for details):  ADL's;IADL's;Rest and Sleep;Leisure;Social Participation    Rehab Potential  Good    Current Impairments/barriers affecting progress:  pain, fear, need to establish trust     OT Frequency  2x / week    OT Duration  8 weeks   frequency corrected original frequency 3x week x 4 weeks followed by 2x week x 4 weeks, pt is being seen 2x week   OT Treatment/Interventions  Self-care/ADL training;Therapeutic exercise;Functional Mobility Training;Balance training;Aquatic Therapy;Ultrasound;Neuromuscular education;Manual Therapy;Splinting;Energy conservation;Therapeutic activities;Coping strategies training;DME and/or AE instruction;Cognitive remediation/compensation;Electrical Stimulation;Scar mobilization;Moist Heat;Cryotherapy;Passive range of motion;Patient/family education    Plan  continue ROM, and light strengthening    Consulted and Agree with Plan of Care  Patient       Patient will benefit from skilled therapeutic intervention in order to improve the following deficits and impairments:  Decreased endurance, Decreased skin integrity, Increased muscle spasms, Decreased coping skills, Decreased knowledge of precautions,  Decreased scar mobility, Improper body mechanics, Decreased activity tolerance, Decreased knowledge of use of DME, Decreased strength, Impaired flexibility, Decreased balance, Decreased mobility, Difficulty walking, Decreased cognition, Decreased range of motion, Increased edema, Pain, Decreased coordination, Decreased safety awareness, Increased fascial restrictions, Impaired UE functional use  Visit Diagnosis: Stiffness of right shoulder, not elsewhere classified  Pain in right upper arm  Muscle weakness (generalized)  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 Fall at  home, initial encounter 07/06/2018  . Displaced spiral fracture of shaft of humerus, right arm, initial encounter for closed fracture 07/06/2018  . TIA (transient ischemic attack) 07/06/2018  . Allergic rhinitis 07/04/2018  . Normochromic normocytic anemia 01/20/2018  . Rheumatoid arthritis ([place]) 04/30/2017  . Lesion of left humerus 04/29/2017  . Pericardial effusion 03/14/2017  . Diabetes mellitus ([place]) 03/14/2017  . Essential hypertension 03/14/2017  . Mixed hyperlipidemia 03/14/2017  . 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 Meralgia paresthetica of right side 01/10/2017  . Allergic rhinoconjunctivitis 07/09/2015  . Asthma, well controlled 07/09/2015  . Laryngopharyngeal reflux (LPR) 07/09/2015    RINE,KATHRYN 08/27/2018, 9:19 AM  [facility] [address] [place], [place], [address] Phone: [phone]   Fax:  [phone]  Name: KEELEE YANKEY MRN: [id number] Date of Birth: 1956-04-10

## 2018-08-28 NOTE — Therapy (Signed)
[facility] [address] [place], [place], [address] Phone: [phone]   Fax:  [phone]  Physical Therapy Treatment  Patient Details  Name: Jocelyn Kaufman MRN: [id number] Date of Birth: 1956/09/14 Referring Provider (PT): Dr. Shelia Media   Encounter Date: 08/27/2018  PT End of Session - 08/28/18 1449    Visit Number  9    Number of Visits  17    Date for PT Re-Evaluation  09/23/18    Authorization Type  BCBS    PT Start Time  1238    PT Stop Time  1320    PT Time Calculation (min)  42 min    Equipment Utilized During Treatment  Gait belt    Activity Tolerance  Patient tolerated treatment well    Behavior During Therapy  [facility] for tasks assessed/performed;Agitated   gets frustrated with PT multiple times during session      Past Medical History:  Diagnosis Date  . Adrenal adenoma   . 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 Depression   . Diabetes mellitus without complication ([place])   . Hyperlipidemia   . Hypertension   . Hypothyroidism   . Meralgia paresthetica of right side 01/10/2017  . Migraine   . Pericardial effusion   . 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 APPENDECTOMY    . CYST REMOVAL LEG Right    Removed from right thigh joint  . ORIF HUMERUS FRACTURE Right 07/09/2018   Procedure: OPEN REDUCTION INTERNAL FIXATION (ORIF) HUMERAL SHAFT FRACTURE;  Surgeon: Hiram Gash, MD;  Location: [facility];  Service: Orthopedics;  Laterality: Right;  . TONSILLECTOMY    . TOTAL VAGINAL HYSTERECTOMY    . UMBILICAL HERNIA REPAIR      Vitals:   08/27/18 1252  BP: 122/68    Subjective Assessment - 08/27/18 1240    Subjective  The patient corrects PT that she is "Ms. Stringfellow" versus "Mrs. Nace".  She notes no changes in vision since yesterday.    She continues with a general dizziness with tendency to move to the left.   The patient also notes she is being referred to  cardiologist due to EKG results of ventricular tachycardia.      Pertinent History  Cushings, DM, HTN, meralgia paresthetica, Migraine, pericardial effusion, TIA (at time of fall)    Patient Stated Goals  "fix the inner ear, reduce need for cane, feel comfortable with mobility"                       [facility] Adult PT Treatment/Exercise - 08/27/18 1250      Ambulation/Gait   Ambulation/Gait  Yes    Ambulation/Gait Assistance  5: Supervision;4: Min guard    Ambulation/Gait Assistance Details  The patient performed gait activities with horizontal head turns for 8 steps to right and left and then dec'ing to every 6 steps x 250 feet.    Assistive device  None    Gait Pattern  Step-through pattern;Decreased stride length;Trunk flexed    Ambulation Surface  Level;Indoor      Neuro Re-ed    Neuro Re-ed Details   Corner standing exercises with feet together on solid surfaces with horiozntal head motion. Patient c/o performing this task b/c this exercise was just taken away from her HEP per her report (PT reviewed in chart gaze x 1 moved from standing to sitting).  We discussed that this  is a different exercise, although feels similar.  She became short of breath and PT had patient sit down.  She discussed feeling unsure of seeing a different PT today and noting that the realization that she has to see a cardiologist is just settling in from an earlier appt today.  Performed seated gaze x 1 adaptation.               PT Short Term Goals - 08/26/18 1418      PT SHORT TERM GOAL #1   Title  patient to be independent with HEP for vestibular and balance activities    Baseline  10/29: Patient reports that she was performing her HEP exercises before a blood vessel burst in her L eye on 10/25    Time  4    Period  Weeks    Status  Achieved      PT SHORT TERM GOAL #2   Title  patient to improve DGI to >/= 15/24    Baseline  10/29: 17/24 indicative of high fall risk    Time  4     Period  Weeks    Status  Achieved      PT SHORT TERM GOAL #3   Title  patient to improve gait speed to >/= 3.5 ft/sec with LRAD at Mod I level over level indoor/outdoor surfaces    Baseline  10/29: 3.93 ft/sec with no AD    Time  4    Period  Weeks    Status  Achieved        PT Long Term Goals - 08/06/18 1638      PT LONG TERM GOAL #1   Title  patient to be independent with advanced HEP     Time  8    Period  Weeks    Status  New      PT LONG TERM GOAL #2   Title  patient to improve gait speed to >/= 4.37 ft/sec with LRAD at IND over various indoor and outdoor surfaces    Time  8    Period  Weeks    Status  New      PT LONG TERM GOAL #3   Title  patient to improve DGI to >/= 19/24 demonstrating reduced fall risk    Time  8    Period  Weeks    Status  New      PT LONG TERM GOAL #4   Title  patient to report reduction in symptom frequency, duration, intensity by >/= 50%    Time  8    Period  Weeks    Status  New      PT LONG TERM GOAL #5   Title  Patient to improve SOT composite score to WNL for age    Baseline  60    Time  8    Period  Weeks    Status  Revised            Plan - 08/28/18 1453    Clinical Impression Statement  The patient is continuing with L eye visual changes (floaters).  Patient tolerated standing exercises well today without c/o nausea during balance activities.  The patient appeared frustrated at various times during our sessions for multiple reasons.     PT Treatment/Interventions  ADLs/Self Care Home Management;Canalith Repostioning;DME Instruction;Balance training;Therapeutic exercise;Therapeutic activities;Functional mobility training;Stair training;Gait training;Neuromuscular re-education;Patient/family education;Manual techniques;Passive range of motion;Vestibular;Visual/perceptual remediation/compensation    PT Next Visit Plan  Status of L eye (  had floaters), increase speed during seated VOR And try to move to standing, Balance and  gait training, increasing vestibular input during balance tasks.    Consulted and Agree with Plan of Care  Patient       Patient will benefit from skilled therapeutic intervention in order to improve the following deficits and impairments:  Decreased activity tolerance, Dizziness, Pain, Postural dysfunction, Decreased strength, Decreased balance  Visit Diagnosis: Dizziness and giddiness  Unsteadiness on feet  History of falling     Problem List Patient Active Problem List   Diagnosis Date Noted  . Fall at home, initial encounter 07/06/2018  . Displaced spiral fracture of shaft of humerus, right arm, initial encounter for closed fracture 07/06/2018  . TIA (transient ischemic attack) 07/06/2018  . Allergic rhinitis 07/04/2018  . Normochromic normocytic anemia 01/20/2018  . Rheumatoid arthritis ([place]) 04/30/2017  . Lesion of left humerus 04/29/2017  . Pericardial effusion 03/14/2017  . Diabetes mellitus ([place]) 03/14/2017  . Essential hypertension 03/14/2017  . Mixed hyperlipidemia 03/14/2017  . 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 Meralgia paresthetica of right side 01/10/2017  . Allergic rhinoconjunctivitis 07/09/2015  . Asthma, well controlled 07/09/2015  . Laryngopharyngeal reflux (LPR) 07/09/2015    Leonte Horrigan, PT 08/28/2018, 2:56 PM  [facility] [address] [place], [place], [address] Phone: [phone]   Fax:  [phone]  Name: VELVIA MEHRER MRN: [id number] Date of Birth: [birth year]

## 2018-09-02 ENCOUNTER — Ambulatory Visit: Payer: BC Managed Care – PPO | Attending: Internal Medicine | Admitting: Occupational Therapy

## 2018-09-02 ENCOUNTER — Ambulatory Visit: Payer: BC Managed Care – PPO | Admitting: Physical Therapy

## 2018-09-02 ENCOUNTER — Encounter: Payer: Self-pay | Admitting: Physical Therapy

## 2018-09-02 DIAGNOSIS — M25611 Stiffness of right shoulder, not elsewhere classified: Secondary | ICD-10-CM | POA: Diagnosis not present

## 2018-09-02 DIAGNOSIS — I69815 Cognitive social or emotional deficit following other cerebrovascular disease: Secondary | ICD-10-CM | POA: Diagnosis present

## 2018-09-02 DIAGNOSIS — R2681 Unsteadiness on feet: Secondary | ICD-10-CM | POA: Insufficient documentation

## 2018-09-02 DIAGNOSIS — M6281 Muscle weakness (generalized): Secondary | ICD-10-CM | POA: Insufficient documentation

## 2018-09-02 DIAGNOSIS — R42 Dizziness and giddiness: Secondary | ICD-10-CM | POA: Diagnosis present

## 2018-09-02 DIAGNOSIS — M79621 Pain in right upper arm: Secondary | ICD-10-CM | POA: Diagnosis present

## 2018-09-02 NOTE — Patient Instructions (Signed)
AROM: Elbow Flexion / Extension    Gently bend elbow as far as possible. Then straighten arm as far as possible. Repeat _10-15___ times per set. Do _1-2__ sessions per day. With 1 lbs weight or can  Flexion - Supine (Dumbbell)      Hold a 1 lbs weight or can in right hand raise over head 10 reps 1-2 x day  Then raise arm overhead and make 10 small circles in 1 direction and 10 small circles in another direction, rest in between.   Copyright  VHI. All rights reserved.     Laying on your back, perform triceps extension by bringing  right arm across your chest with elbow bent , hold back of right arm with left hand, bend and straighten elbow while holding 1  Lbs weight, 10 reps 1x day

## 2018-09-02 NOTE — Therapy (Signed)
[facility] [address] [place], [place], [address] Phone: [phone]   Fax:  [phone]  Physical Therapy Treatment  Patient Details  Name: Jocelyn Kaufman MRN: [id number] Date of Birth: 01-10-1956 Referring Provider (PT): Dr. Shelia Media   Encounter Date: 09/02/2018  PT End of Session - 09/02/18 1220    Visit Number  10    Number of Visits  17    Date for PT Re-Evaluation  09/23/18    Authorization Type  BCBS    PT Start Time  1106   pt in restroom at beginning of session   PT Stop Time  1145    PT Time Calculation (min)  39 min    Equipment Utilized During Treatment  Gait belt    Activity Tolerance  Patient tolerated treatment well    Behavior During Therapy  [facility] for tasks assessed/performed       Past Medical History:  Diagnosis Date  . Adrenal adenoma   . 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 Depression   . Diabetes mellitus without complication ([place])   . Hyperlipidemia   . Hypertension   . Hypothyroidism   . Meralgia paresthetica of right side 01/10/2017  . Migraine   . Pericardial effusion   . 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 APPENDECTOMY    . CYST REMOVAL LEG Right    Removed from right thigh joint  . ORIF HUMERUS FRACTURE Right 07/09/2018   Procedure: OPEN REDUCTION INTERNAL FIXATION (ORIF) HUMERAL SHAFT FRACTURE;  Surgeon: Hiram Gash, MD;  Location: [facility];  Service: Orthopedics;  Laterality: Right;  . TONSILLECTOMY    . TOTAL VAGINAL HYSTERECTOMY    . UMBILICAL HERNIA REPAIR      There were no vitals filed for this visit.  Subjective Assessment - 09/02/18 1109    Subjective  Arm is not too painful today. Feels more heavy than anything. Feels that the floaters in her left eye are making the exercises more difficult and has started having nausea again. Chose to not bring cane in and is using it less. Has been practicing walking in her yard  (very uneven) with and without cane. Reports in addition to the rt lateral thigh numbness/tingling she has had (PMH) she also has numbness and tingling along outside of her rt foot. Reports when she drifts she always goes to her left.     Pertinent History  Cushings, DM, HTN, meralgia paresthetica, Migraine, pericardial effusion, TIA (at time of fall)    Patient Stated Goals  "fix the inner ear, reduce need for cane, feel comfortable with mobility"    Currently in Pain?  Yes    Pain Score  2     Pain Location  Arm    Pain Orientation  Right    Pain Descriptors / Indicators  Aching;Heaviness    Pain Type  Surgical pain    Pain Onset  More than a month ago    Pain Frequency  Intermittent    Aggravating Factors   when using RUE    Pain Relieving Factors  rest, meds                            Balance Exercises - 09/02/18 1211      Balance Exercises: Standing   SLS  Eyes open;Solid surface;Intermittent upper extremity support;3 reps;30 secs   lifted foot on  upright yoga block; std on RLE most difficult; moved to kitchen sink with cabinet door open and foot on shelf; added head turns   Gait with Head Turns  Forward;Retro;4 reps   40 ft hallway; min guard; pt stops and recovers sm imbalance   Retro Gait  Head turns    Other Standing Exercises  walking with head nods did not provoke imbalance      Treadmill at 1.0 mph with bil UE support x 3 min 41 seconds. Gait very symmetric (very slight left hip trendelenburg). Stopped due to left hip "beginning to catch" with pt reporting usually it is her right hip and this has been a prior issue.    PT Education - 09/02/18 1218    Education Details  revised SLS for HEP to practice SLS only on RLE to encourage improved balance/habituation over RLE    Person(s) Educated  Patient    Methods  Explanation;Demonstration;Verbal cues;Handout    Comprehension  Verbalized understanding;Returned demonstration;Verbal cues required;Tactile  cues required       PT Short Term Goals - 08/26/18 1418      PT SHORT TERM GOAL #1   Title  patient to be independent with HEP for vestibular and balance activities    Baseline  10/29: Patient reports that she was performing her HEP exercises before a blood vessel burst in her L eye on 10/25    Time  4    Period  Weeks    Status  Achieved      PT SHORT TERM GOAL #2   Title  patient to improve DGI to >/= 15/24    Baseline  10/29: 17/24 indicative of high fall risk    Time  4    Period  Weeks    Status  Achieved      PT SHORT TERM GOAL #3   Title  patient to improve gait speed to >/= 3.5 ft/sec with LRAD at Mod I level over level indoor/outdoor surfaces    Baseline  10/29: 3.93 ft/sec with no AD    Time  4    Period  Weeks    Status  Achieved        PT Long Term Goals - 08/06/18 2992      PT LONG TERM GOAL #1   Title  patient to be independent with advanced HEP     Time  8    Period  Weeks    Status  New      PT LONG TERM GOAL #2   Title  patient to improve gait speed to >/= 4.37 ft/sec with LRAD at IND over various indoor and outdoor surfaces    Time  8    Period  Weeks    Status  New      PT LONG TERM GOAL #3   Title  patient to improve DGI to >/= 19/24 demonstrating reduced fall risk    Time  8    Period  Weeks    Status  New      PT LONG TERM GOAL #4   Title  patient to report reduction in symptom frequency, duration, intensity by >/= 50%    Time  8    Period  Weeks    Status  New      PT LONG TERM GOAL #5   Title  Patient to improve SOT composite score to WNL for age    Baseline  60    Time  8  Period  Weeks    Status  Revised            Plan - 09/02/18 1326    Clinical Impression Statement  Session focused on dynamic activities stimulating vestibular system to patient's tolerance. Patient continues to report feeling of drifting to her left and worked on RLE SLS tasks (which were more challenging than LLE SLS). She also reports rt lateral  foot numbness (rt only). Patient continues to progress and today tolerated 3 min 41 seconds on the treadmill as beginning to look towards ongoing exercise program when discharges from PT. She reports she enjoys walking outside, however would also use the treadmill at the gym. Will continue to progress towards LTGs     PT Frequency  2x / week    PT Duration  8 weeks    PT Treatment/Interventions  ADLs/Self Care Home Management;Canalith Repostioning;DME Instruction;Balance training;Therapeutic exercise;Therapeutic activities;Functional mobility training;Stair training;Gait training;Neuromuscular re-education;Patient/family education;Manual techniques;Passive range of motion;Vestibular;Visual/perceptual remediation/compensation    PT Next Visit Plan  Check on status of seated VOR And try to move to standing (was having incr difficulty with floaters left eye when last assessed), Balance (including encouraging RLE SLS and confidence over RLE) and gait training, increasing vestibular input during balance tasks.    PT Home Exercise Plan  [id number] (initial neck ROM, STS, etc ex's); [id number] (VOR, SLS)    Consulted and Agree with Plan of Care  Patient       Patient will benefit from skilled therapeutic intervention in order to improve the following deficits and impairments:  Decreased activity tolerance, Dizziness, Pain, Postural dysfunction, Decreased strength, Decreased balance  Visit Diagnosis: Dizziness and giddiness  Unsteadiness on feet     Problem List Patient Active Problem List   Diagnosis Date Noted  . Fall at home, initial encounter 07/06/2018  . Displaced spiral fracture of shaft of humerus, right arm, initial encounter for closed fracture 07/06/2018  . TIA (transient ischemic attack) 07/06/2018  . Allergic rhinitis 07/04/2018  . Normochromic normocytic anemia 01/20/2018  . Rheumatoid arthritis ([place]) 04/30/2017  . Lesion of left humerus 04/29/2017  . Pericardial effusion 03/14/2017   . Diabetes mellitus ([place]) 03/14/2017  . Essential hypertension 03/14/2017  . Mixed hyperlipidemia 03/14/2017  . 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 Meralgia paresthetica of right side 01/10/2017  . Allergic rhinoconjunctivitis 07/09/2015  . Asthma, well controlled 07/09/2015  . Laryngopharyngeal reflux (LPR) 07/09/2015    Rexanne Mano, PT 09/02/2018, 1:47 PM  [facility] [address] [place], [place], [address] Phone: [phone]   Fax:  [phone]  Name: JANELIE GOLTZ MRN: [id number] Date of Birth: 23-Oct-1956

## 2018-09-02 NOTE — Patient Instructions (Signed)
Access Code: [email or web address]  URL: [email or web address]  Date: 09/02/2018  Prepared by: Barry Brunner   Exercises  Seated Gaze Stabilization with Head Nod - 2 reps - 30-60 hold - 2-3x daily - 7x weekly  Standing Single Leg Stance with Counter Support - 3 reps - 1 sets - 30 seconds hold - 1x daily - 5x weekly (see modifications)

## 2018-09-03 ENCOUNTER — Encounter: Payer: Self-pay | Admitting: Internal Medicine

## 2018-09-03 ENCOUNTER — Ambulatory Visit (INDEPENDENT_AMBULATORY_CARE_PROVIDER_SITE_OTHER): Payer: BC Managed Care – PPO | Admitting: Internal Medicine

## 2018-09-03 VITALS — BP 134/58 | HR 73 | Ht 64.5 in | Wt 233.0 lb

## 2018-09-03 DIAGNOSIS — R079 Chest pain, unspecified: Secondary | ICD-10-CM

## 2018-09-03 DIAGNOSIS — Z01812 Encounter for preprocedural laboratory examination: Secondary | ICD-10-CM

## 2018-09-03 DIAGNOSIS — I4729 Other ventricular tachycardia: Secondary | ICD-10-CM

## 2018-09-03 DIAGNOSIS — I472 Ventricular tachycardia: Secondary | ICD-10-CM

## 2018-09-03 DIAGNOSIS — R0602 Shortness of breath: Secondary | ICD-10-CM

## 2018-09-03 MED ORDER — METOPROLOL SUCCINATE ER 25 MG PO TB24: 25.0000 mg | ORAL_TABLET | Freq: Every day | ORAL | 3 refills | Status: DC

## 2018-09-03 NOTE — Therapy (Signed)
[facility] [address] [place], [place], [address] Phone: [phone]   Fax:  [phone]  Occupational Therapy Treatment  Patient Details  Name: Jocelyn Kaufman MRN: [id number] Date of Birth: 24-May-1956 Referring Provider (OT): Deland Pretty   Encounter Date: 09/02/2018  OT End of Session - 09/02/18 1526    Visit Number  10    Number of Visits  21    Date for OT Re-Evaluation  09/28/18    Authorization Type  BCBS, VL=[place]    OT Start Time  1150    OT Stop Time  1230    OT Time Calculation (min)  40 min    Activity Tolerance  Patient tolerated treatment well    Behavior During Therapy  [facility] for tasks assessed/performed       Past Medical History:  Diagnosis Date  . Adrenal adenoma   . 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 Depression   . Diabetes mellitus without complication ([place])   . Hyperlipidemia   . Hypertension   . Hypothyroidism   . Meralgia paresthetica of right side 01/10/2017  . Migraine   . Pericardial effusion   . 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 APPENDECTOMY    . CYST REMOVAL LEG Right    Removed from right thigh joint  . ORIF HUMERUS FRACTURE Right 07/09/2018   Procedure: OPEN REDUCTION INTERNAL FIXATION (ORIF) HUMERAL SHAFT FRACTURE;  Surgeon: Hiram Gash, MD;  Location: [facility];  Service: Orthopedics;  Laterality: Right;  . TONSILLECTOMY    . TOTAL VAGINAL HYSTERECTOMY    . UMBILICAL HERNIA REPAIR      There were no vitals filed for this visit.  Subjective Assessment - 09/02/18 1159    Subjective   Pt reports she has been having alot of muscle spasms    Limitations  Pt is now cleared for ROM as tolerated, and gentle strengthening as tolerated    Currently in Pain?  Yes    Pain Score  2     Pain Location  Arm    Pain Orientation  Right    Pain Descriptors / Indicators  Aching    Pain Type  Surgical pain    Pain Onset  More than a  month ago    Pain Frequency  Intermittent    Aggravating Factors   exercises    Pain Relieving Factors  rest, meds              Treatment:Supine closed chain shoulder flexion and chest press, then unilateral shoulder flexion and circumduction with 1 lbs weight, and then triceps extension A/ROM with 1 lbs weight,  min facilitation/ v.c AA/ROM shoulder abduction and shoulder flexion within tolerated ROM. Arm bike x 5 mins level 1 for conditioning             OT Education - 09/02/18 1414    Education Details  updated strengthening HEP with 1 lbs weight, see pt instructions    Person(s) Educated  Patient    Methods  Handout;Explanation    Comprehension  Verbalized understanding       OT Short Term Goals - 09/02/18 1527      OT SHORT TERM GOAL #1   Title  Patient will complete an HEP designed to increase elbow through hand AROM DUE 08/29/2018    Status  Achieved      OT SHORT TERM GOAL #2   Title  Patient will demonstrate knowledge and effective technique for scar mobilization and management    Status  Achieved      OT SHORT TERM GOAL #3   Title  Patient will demonstrate understanding of safe transition out of sling schedule    Status  Achieved      OT SHORT TERM GOAL #4   Title  Patient will complete HEP designed for self ROM for right shoulder    Status  Achieved      OT SHORT TERM GOAL #5   Title  Patient will incorporate right hand into daily functional tasks as able, incorporating elbow, flroearm, wrist and hand motion, e.g. writing, feeding, oral hygiene, aspects of bathing, dresisng    Status  Achieved        OT Long Term Goals - 08/20/18 1540      OT LONG TERM GOAL #1   Title  Patient will complete update HEP designed to improve AROM RUE DUE 09/28/18    Status  On-going      OT LONG TERM GOAL #2   Title  Patient will demonstrate adequate shoulder range of motion and strength to obtain / place a lightweight (LESS THAN 2LB) object on chest height  shelf with RUE    Status  On-going      OT LONG TERM GOAL #3   Title  Patient will demonstrate adequate strength and range of motion to use a curling iron for hair care - one hand for comb, one hand for iron    Status  On-going      OT LONG TERM GOAL #4   Title  Patient will demonstrate adequate strength and range of motion to apply and remove seat belt, includes buckle (driver's side or passenger side) without report of increased pain    Status  On-going      OT LONG TERM GOAL #5   Title  Patient will demonstrate adequate range of motion and consistent pressure to use BUE's to guide material through sewing machine    Status  On-going            Plan - 09/02/18 1527    Clinical Impression Statement  Pt is progressing towards goals. She demonstrates improving ROM and strength. Pt's pain was better today and there for updated HEP was issued.    Occupational Profile and client history currently impacting functional performance  retired Pharmacist, hospital, Psychologist, occupational, enjoys chair yoga, Radiographer, therapeutic    Occupational performance deficits (Please refer to evaluation for details):  ADL's;IADL's;Rest and Sleep;Leisure;Social Participation    Rehab Potential  Good    OT Frequency  2x / week    OT Duration  8 weeks    OT Treatment/Interventions  Self-care/ADL training;Therapeutic exercise;Functional Mobility Training;Balance training;Aquatic Therapy;Ultrasound;Neuromuscular education;Manual Therapy;Splinting;Energy conservation;Therapeutic activities;Coping strategies training;DME and/or AE instruction;Cognitive remediation/compensation;Electrical Stimulation;Scar mobilization;Moist Heat;Cryotherapy;Passive range of motion;Patient/family education    Plan  continue ROM, and light strengthening, issue updated HEP when possible    Consulted and Agree with Plan of Care  Patient       Patient will benefit from skilled therapeutic intervention in order to improve the following deficits and  impairments:  Decreased endurance, Decreased skin integrity, Increased muscle spasms, Decreased coping skills, Decreased knowledge of precautions, Decreased scar mobility, Improper body mechanics, Decreased activity tolerance, Decreased knowledge of use of DME, Decreased strength, Impaired flexibility, Decreased balance, Decreased mobility, Difficulty walking, Decreased cognition, Decreased range of motion, Increased edema, Pain, Decreased coordination, Decreased safety awareness, Increased fascial restrictions, Impaired UE functional  use  Visit Diagnosis: Stiffness of right shoulder, not elsewhere classified  Pain in right upper arm  Muscle weakness (generalized)    Problem List Patient Active Problem List   Diagnosis Date Noted  . Fall at home, initial encounter 07/06/2018  . Displaced spiral fracture of shaft of humerus, right arm, initial encounter for closed fracture 07/06/2018  . TIA (transient ischemic attack) 07/06/2018  . Allergic rhinitis 07/04/2018  . Normochromic normocytic anemia 01/20/2018  . Rheumatoid arthritis ([place]) 04/30/2017  . Lesion of left humerus 04/29/2017  . Pericardial effusion 03/14/2017  . Diabetes mellitus ([place]) 03/14/2017  . Essential hypertension 03/14/2017  . Mixed hyperlipidemia 03/14/2017  . 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 Meralgia paresthetica of right side 01/10/2017  . Allergic rhinoconjunctivitis 07/09/2015  . Asthma, well controlled 07/09/2015  . Laryngopharyngeal reflux (LPR) 07/09/2015    RINE,KATHRYN 09/03/2018, 4:52 PM Theone Murdoch, OTR/L [phone] Phone: [phone] 4:53 PM 09/03/18 [facility] [address] [place], [place], [address] Phone: [phone]   Fax:  [phone]  Name: Jocelyn Kaufman MRN: [id number] Date of Birth: 1956/02/16

## 2018-09-03 NOTE — Progress Notes (Signed)
OFFICE CONSULT NOTE  Chief Complaint:  NSVT  Primary Care Physician: Deland Pretty, MD  HPI:  Jocelyn Kaufman is a 62 y.o. female who is being seen today for the evaluation of intermittent chest pressure at the request of Deland Pretty, MD. Jocelyn Kaufman is a 63 year old female with a history of type 2 diabetes, depression, hyperlipidemia, hypertension, left ulnar neuropathy, possible obstructive sleep apnea, and recent extensive workup at the [facility] for possible Cushing's disease. Per their report it does not seem that she has this although she was noted to have bilateral adrenal hyperplasia. I first met Jocelyn Kaufman in 2012 which time she was hospitalized for chest pain and underwent a heart catheterization after an abnormal nuclear stress test, which I performed demonstrating normal coronary arteries. As part of her workup at the [facility] she underwent an echocardiogram. I reviewed the echocardiogram report but did not have the images to directly review. This demonstrated an LVEF of 55%, no regional wall motion abnormalities, mild left atrial enlargement, and a small, hemodynamically significant pericardial effusion. RVSP was normal at 32 mmHg. The study was performed on 02/11/2017. She says that over the past week or 2 she's had some more intermittent chest pressure. This is substernal and does not radiate. It's not worse with exertion or relieved by rest. The etiology of her pericardial effusion is not clear however NIH suggested a workup for immune disorders. When I asked her about family history, she was somewhat unclear about whether autoimmune disorders were in her family. She also inquired today for me to fill out paperwork supporting disability. She is a 6 grade teacher. I reiterated that I did not believe that at this point she hasn't developed disabling cardiovascular condition, although she has multiple medical problems which are currently being addressed by her primary care  provider.  04/30/2017  [name] returns today for follow-up. As per our previous plan she underwent a repeat echocardiogram to evaluate her small pericardial effusion. The study on 03/28/2017 showed an EF of 27-51%, grade 2 diastolic dysfunction and a small to moderate sized pericardial effusion without tamponade features. We attempted to contact the patient on 03/29/2017 and 04/01/2017 but received a smart call blocker response. She was then notified of results and a copy of the echo report was left per her request at the front desk (see notes attached to the echo dated 03/28/2017). Based on those findings and the acute change in size of the pericardial effusion, I recommended a repeat echo in one month to rule out any worsening pericardial effusion. She expressed interest in following up after that limited echo. A limited echo to evaluate the pericardial effusion was performed yesterday on 04/29/2017. I personally reviewed the study and EF has improved to 60-65%, again grade 2 diastolic dysfunction is noted with a much smaller pericardial effusion. There was no evidence of tamponade physiology. When we initially spoke she reported that she had not received any information about the studies and was unclear why she needed to echoes. She seems somewhat frustrated and overwhelmed with the myriad of doctors that she's been seeing. Recently she was seen by Dr. Kathlene November, a rheumatologist, who diagnosed her with rheumatoid arthritis. She's been started on methotrexate. She says that she has not noticed a significant difference in swelling of her hands, however it may be improving her pericardial effusion. She does not report any significant persistent chest discomfort or shortness of breath, rather occasional episodes.  10/17/2017  Jocelyn Kaufman returns today  for follow-up.  It has been 6 months since I last saw her.  Her recent echo in July 2018 showed a slightly higher EF of 60-65% with a small pericardial effusion which  was improved.  Overall she feels much better.  She has started on a medication called Simponi for her RA in addition to methotrexate.  She is only had one dose but she responded well to it.  She denies any further chest heaviness, pressure pain or shortness of breath.  06/27/2018  Jocelyn Kaufman is seen today in follow-up.  She underwent a limited echo on 05/02/2018 which showed a small, mostly posterior pericardial effusion which is essentially unchanged.  LVEF was normal at 60 to 65%.  Her echoes have been stable compared to studies back in 2018 and the degree of effusion is much less than was originally identified when she underwent evaluation at the [facility].  This is likely related to RA.  Subsequently she has been diagnosed with fibromyalgia.  She is also on insulin and Jardiance for control of blood sugars.  In general she says she feels much better than she had in the past.  09/05/2018  Jocelyn Kaufman is seen today in follow-up.  Recently I had seen her and she had a stable trivial pericardial effusion.  Based on no subsequent changes I felt follow-up could be as necessary.  Subsequently she was hospitalized after an episode of possible TIA including speech difficulties and associated fall, sustaining a humeral fracture that required open reduction and internal fixation.  Post discharge she was placed on a monitor which did show brief episodes of nonsustained VT and PVCs.  She is subsequently referred back for evaluation of nonsustained VT.  He denies any chest pain.  It was noted that she had normal coronary arteries in 2012 by heart catheterization.  PMHx:  Past Medical History:  Diagnosis Date  . Adrenal adenoma   . 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 Depression   . Diabetes mellitus without complication ([place])   . Hyperlipidemia   . Hypertension   . Hypothyroidism   . Meralgia paresthetica of right side 01/10/2017  . Migraine   . Pericardial effusion   . Ulnar neuropathy at wrist, 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    Past  Surgical History:  Procedure Laterality Date  . APPENDECTOMY    . CYST REMOVAL LEG Right    Removed from right thigh joint  . ORIF HUMERUS FRACTURE Right 07/09/2018   Procedure: OPEN REDUCTION INTERNAL FIXATION (ORIF) HUMERAL SHAFT FRACTURE;  Surgeon: Hiram Gash, MD;  Location: [facility];  Service: Orthopedics;  Laterality: Right;  . TONSILLECTOMY    . TOTAL VAGINAL HYSTERECTOMY    . UMBILICAL HERNIA REPAIR      FAMHx:  Family History  Problem Relation Age of Onset  . Hypertension Mother   . Stroke Mother   . Kidney failure Mother   . Heart disease Mother        heart murmur  . Glaucoma Mother   . Stroke Father   . COPD Father   . Cancer - Prostate Father   . Colon cancer Father     SOCHx:   reports that she has never smoked. She has never used smokeless tobacco. She reports that she drinks alcohol. She reports that she does not use drugs.  ALLERGIES:  Allergies  Allergen Reactions  . Invokana [Canagliflozin] Other (See Comments)    Urinary incontinence  . Percocet [Oxycodone-Acetaminophen] Nausea And Vomiting  . Prilosec [Omeprazole] Rash  .  Rogaine [Minoxidil] Rash  . Victoza [Liraglutide] Itching    ROS: Pertinent items noted in HPI and remainder of comprehensive ROS otherwise negative.  HOME MEDS: Current Outpatient Medications on File Prior to Visit  Medication Sig Dispense Refill  . albuterol (PROVENTIL HFA;VENTOLIN HFA) 108 (90 Base) MCG/ACT inhaler Inhale two puffs every four to six hours as needed for cough or wheeze. (Patient taking differently: Inhale 2 puffs into the lungs every 4 (four) hours as needed for wheezing (or cough). ) 1 Inhaler 1  . amLODipine (NORVASC) 5 MG tablet Take 5 mg by mouth daily.    Marland Kitchen aspirin 325 MG tablet Take 1 tablet (325 mg total) by mouth daily. 30 tablet 0  . atorvastatin (LIPITOR) 40 MG tablet Take 1 tablet (40 mg total) by mouth daily at 6 PM. 30 tablet 0  . cetirizine (ZYRTEC) 10 MG tablet Take 10 mg by mouth daily.    .  Cholecalciferol (VITAMIN D-3 PO) Take 50,000 Units by mouth every Monday.     . docusate sodium (COLACE) 100 MG capsule Take 1 capsule (100 mg total) by mouth 2 (two) times daily. 10 capsule 0  . Dulaglutide (TRULICITY) 1.5 AY/3.0ZS SOPN Inject 1.5 mg into the skin once a week.     . empagliflozin (JARDIANCE) 25 MG TABS tablet Take 25 mg by mouth daily.     Marland Kitchen EPINEPHrine (EPIPEN 2-PAK) 0.3 mg/0.3 mL IJ SOAJ injection Use as directed for life-threatening allergic reaction. (Patient taking differently: Inject 0.3 mg into the muscle once. for life-threatening allergic reaction.) 2 Device 1  . escitalopram (LEXAPRO) 20 MG tablet Take 20 mg by mouth daily.    . ferrous sulfate 325 (65 FE) MG tablet Take 365 mg by mouth daily.     . folic acid (FOLVITE) 1 MG tablet Take 2 mg by mouth daily.    . Golimumab (SIMPONI [place]) Inject into the skin.    . hydrALAZINE (APRESOLINE) 25 MG tablet Take 25 mg by mouth 2 (two) times daily.    . irbesartan-hydrochlorothiazide (AVALIDE) 300-12.5 MG tablet Take 1 tablet by mouth daily.    Marland Kitchen levothyroxine (SYNTHROID) 125 MCG tablet Take 125 mcg by mouth daily before breakfast. BRAND ONLY    . LORazepam (ATIVAN) 0.5 MG tablet Take 0.5 mg by mouth as needed for anxiety.    . Magnesium 500 MG TABS Take 500 mg by mouth daily.     . metaxalone (SKELAXIN) 800 MG tablet Take 400 mg by mouth daily as needed for muscle spasms.     . Methotrexate, Anti-Rheumatic, (METHOTREXATE, PF, ) Inject 0.8 Units into the skin once a week.    . Multiple Vitamins-Minerals (CENTRUM SILVER PO) Take 1 tablet by mouth daily.    . TRAVATAN Z 0.004 % SOLN ophthalmic solution Place 0.004 drops into both eyes daily.    . vitamin B-12 (CYANOCOBALAMIN) 100 MCG tablet Take 100 mcg by mouth daily.     Marland Kitchen VITAMIN D, ERGOCALCIFEROL, PO Take 2,000 mg by mouth 2 (two) times daily.     No current facility-administered medications on file prior to visit.     LABS/IMAGING: No results found for this or any  previous visit (from the past 48 hour(s)). No results found.  LIPID PANEL:    Component Value Date/Time   CHOL 221 (H) 07/07/2018 0514   TRIG 266 (H) 07/07/2018 0514   HDL 42 07/07/2018 0514   CHOLHDL 5.3 07/07/2018 0514   VLDL 53 (H) 07/07/2018 0514   LDLCALC 126 (  H) 07/07/2018 0514    WEIGHTS: Wt Readings from Last 3 Encounters:  09/03/18 233 lb (105.7 kg)  07/09/18 236 lb 5.3 oz (107.2 kg)  07/04/18 230 lb 3.2 oz (104.4 kg)    VITALS: BP (!) 134/58 (BP Location: Left Arm, Patient Position: Sitting, Cuff Size: Large)   Pulse 73   Ht 5' 4.5" (1.638 m)   Wt 233 lb (105.7 kg)   BMI 39.38 kg/m   EXAM: General appearance: alert, no distress and morbidly obese Neck: no carotid bruit, no JVD and thyroid not enlarged, symmetric, no tenderness/mass/nodules Lungs: clear to auscultation bilaterally Heart: regular rate and rhythm Abdomen: soft, non-tender; bowel sounds normal; no masses,  no organomegaly Extremities: extremities normal, atraumatic, no cyanosis or edema Pulses: 2+ and symmetric Skin: Skin color, texture, turgor normal. No rashes or lesions Neurologic: Grossly normal Psych: Pleasant  EKG: Sinus rhythm with frequent PVCs in a trigeminal pattern at 73-personally reviewed  ASSESSMENT: 1. Mechanical fall-no syncope 2. Possible TIA 3. NSVT 4. Pericardial effusion -small, stable, likely related to rheumatoid arthritis 5. Newly diagnosed rheumatoid arthritis 6. Fibromyalgia 7. History of false positive Myoview in 2012 with normal coronaries by catheterization 8. Bilateral adrenal hyperplasia without Cushing's disease (per NIH work-up) 9. Dependent diabetes  PLAN: 1.   Mrs. Maes unfortunately has been unwell over the past couple months with a possible TIA event.  Her monitor did demonstrate some nonsustained VT of unknown significance.  It does not sound like she had a syncopal event, rather mechanical fall.  She did have what sounds like a TIA event.  She had  normal coronaries by cath in 2012.  I would like to repeat an ischemia evaluation with a CT coronary angiogram.  If this does not show obstructive disease then my concern for the nonsustained VT being an issue is less.  He is noted to have some PVCs in a trigeminal pattern today.  I recommend starting low-dose beta-blocker to see if we can suppress those.  Follow-up with me afterwards.  Pixie Casino, MD, [facility], [place] Director of the Advanced Lipid Disorders &  Cardiovascular Risk Reduction Clinic Diplomate of the American Board of Clinical Lipidology Attending Cardiologist  Direct Dial: [phone]  Fax: [phone]  Website:  [email or web address]  09/03/2018, 11:45 AM

## 2018-09-03 NOTE — Patient Instructions (Addendum)
Medication Instructions:  START metoprolol succinate 25mg  daily DECREASE amlodipine to 2.5mg  daily If you need a refill on your cardiac medications before your next appointment, please call your pharmacy.   Lab work: BMET - due 1 week prior to CT test If you have labs (blood work) drawn today and your tests are completely normal, you will receive your results only by: Marland Kitchen MyChart Message (if you have MyChart) OR . A paper copy in the mail If you have any lab test that is abnormal or we need to change your treatment, we will call you to review the results.  Testing/Procedures: Dr. Debara Pickett has ordered a coronary CT angiogram. This will be preauthorized with your insurance and then you will be called to schedule. The test is done at [facility]: At [facility], you and your health needs are our priority.  As part of our continuing mission to provide you with exceptional heart care, we have created designated Provider Care Teams.  These Care Teams include your primary Cardiologist (physician) and Advanced Practice Providers (APPs -  Physician Assistants and Nurse Practitioners) who all work together to provide you with the care you need, when you need it. . You will need a follow up appointment in 8-10 weeks with Dr. Debara Pickett (after CT study)  Any Other Special Instructions Will Be Listed Below (If Applicable).    Please arrive at the [facility] main entrance of [facility] at _____ AM/PM (30-45 minutes prior to test start time)  [facility] [place], [place] [address] [phone]  Proceed to the [facility] Radiology Department (First Floor).  Please follow these instructions carefully (unless otherwise directed):   On the Night Before the Test: . Be sure to Drink plenty of water. . Do not consume any caffeinated/decaffeinated beverages or chocolate 12 hours prior to your test. . Do not take any antihistamines 12 hours prior to your  test.  On the Day of the Test: . Drink plenty of water. Do not drink any water within one hour of the test. . Do not eat any food 4 hours prior to the test. . You may take your regular medications prior to the test.  . Take metoprolol succinate TWO HOURS prior to test  After the Test: . Drink plenty of water. . After receiving IV contrast, you may experience a mild flushed feeling. This is normal. . On occasion, you may experience a mild rash up to 24 hours after the test. This is not dangerous. If this occurs, you can take Benadryl 25 mg and increase your fluid intake. . If you experience trouble breathing, this can be serious. If it is severe call 911 IMMEDIATELY. If it is mild, please call our office. . If you take any of these medications: Glipizide/Metformin, Avandament, Glucavance, please do not take 48 hours after completing test.

## 2018-09-04 ENCOUNTER — Encounter: Payer: Self-pay | Admitting: Rehabilitative and Restorative Service Providers"

## 2018-09-04 ENCOUNTER — Ambulatory Visit: Payer: BC Managed Care – PPO | Admitting: Rehabilitative and Restorative Service Providers"

## 2018-09-04 ENCOUNTER — Ambulatory Visit: Payer: BC Managed Care – PPO | Admitting: Occupational Therapy

## 2018-09-04 DIAGNOSIS — M6281 Muscle weakness (generalized): Secondary | ICD-10-CM

## 2018-09-04 DIAGNOSIS — M25611 Stiffness of right shoulder, not elsewhere classified: Secondary | ICD-10-CM

## 2018-09-04 DIAGNOSIS — R2681 Unsteadiness on feet: Secondary | ICD-10-CM

## 2018-09-04 DIAGNOSIS — I69815 Cognitive social or emotional deficit following other cerebrovascular disease: Secondary | ICD-10-CM

## 2018-09-04 DIAGNOSIS — M79621 Pain in right upper arm: Secondary | ICD-10-CM

## 2018-09-04 DIAGNOSIS — R42 Dizziness and giddiness: Secondary | ICD-10-CM

## 2018-09-04 NOTE — Patient Instructions (Signed)
Access Code: [email or web address]  URL: [email or web address]  Date: 09/04/2018  Prepared by: Rudell Cobb   Exercises Neck Rotation - 10 reps - 2 sets - 1x daily - 7x weekly Neck Sidebending - 10 reps - 2 sets - 1x daily - 7x weekly Sit to Stand with Arms Crossed - 5 reps - 2 sets - 1x daily - 7x weekly Standing Marching - 10 reps - 2 sets - 1x daily - 7x weekly Standing Hip Abduction with Counter Support - 10 reps - 2 sets - 1x daily - 7x weekly Standing Gaze Stabilization with Head Rotation - 2 sets - 30 seconds hold - 2x daily - 7x weekly Single Leg Stance - 3 reps - 1 sets - 10 seconds hold - 1x daily - 7x weekly

## 2018-09-04 NOTE — Therapy (Signed)
[facility] [address] [place], [place], [address] Phone: [phone]   Fax:  [phone]  Occupational Therapy Treatment  Patient Details  Name: [name] MRN: [id number] Date of Birth: 1956-05-28 Referring Provider (OT): Deland Pretty   Encounter Date: 09/04/2018  OT End of Session - 09/04/18 1138    Visit Number  11    Number of Visits  21    Date for OT Re-Evaluation  09/28/18    Authorization Type  BCBS, [place]    OT Start Time  1110   pt in BR   OT Stop Time  1145    OT Time Calculation (min)  35 min    Activity Tolerance  Patient tolerated treatment well    Behavior During Therapy  [facility] for tasks assessed/performed       Past Medical History:  Diagnosis Date  . Adrenal adenoma   . 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 Depression   . Diabetes mellitus without complication ([place])   . Hyperlipidemia   . Hypertension   . Hypothyroidism   . Meralgia paresthetica of right side 01/10/2017  . Migraine   . Pericardial effusion   . 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 APPENDECTOMY    . CYST REMOVAL LEG Right    Removed from right thigh joint  . ORIF HUMERUS FRACTURE Right 07/09/2018   Procedure: OPEN REDUCTION INTERNAL FIXATION (ORIF) HUMERAL SHAFT FRACTURE;  Surgeon: Hiram Gash, MD;  Location: [facility];  Service: Orthopedics;  Laterality: Right;  . TONSILLECTOMY    . TOTAL VAGINAL HYSTERECTOMY    . UMBILICAL HERNIA REPAIR      There were no vitals filed for this visit.  Subjective Assessment - 09/04/18 1137    Subjective   Pt reports they put her on a beta blocker    Limitations  Pt is now cleared for ROM as tolerated, and gentle strengthening as tolerated    Currently in Pain?  No/denies             Treatment: Supine closed chain shoulder flexion and chest press, then unilateral shoulder flexion and circumduction with 1 lbs weight, and then  triceps extension A/ROM with 1 lbs weight,  min facilitation/ v.c AA/ROM shoulder abduction,. Standing for mid range functional reaching, supervision/ min v.c to avoid compensation. Pt fatigues very quickly requiring frequent rest breaks. Arm bike x 6 mins level 3 for conditioning                OT Short Term Goals - 09/02/18 1527      OT SHORT TERM GOAL #1   Title  Patient will complete an HEP designed to increase elbow through hand AROM DUE 08/29/2018    Status  Achieved      OT SHORT TERM GOAL #2   Title  Patient will demonstrate knowledge and effective technique for scar mobilization and management    Status  Achieved      OT SHORT TERM GOAL #3   Title  Patient will demonstrate understanding of safe transition out of sling schedule    Status  Achieved      OT SHORT TERM GOAL #4   Title  Patient will complete HEP designed for self ROM for right shoulder    Status  Achieved      OT SHORT TERM GOAL #5   Title  Patient will incorporate right hand into daily  functional tasks as able, incorporating elbow, flroearm, wrist and hand motion, e.g. writing, feeding, oral hygiene, aspects of bathing, dresisng    Status  Achieved        OT Long Term Goals - 08/20/18 1540      OT [place] #1   Title  Patient will complete update HEP designed to improve AROM RUE DUE 09/28/18    Status  On-going      OT LONG TERM GOAL #2   Title  Patient will demonstrate adequate shoulder range of motion and strength to obtain / place a lightweight (LESS THAN 2LB) object on chest height shelf with RUE    Status  On-going      OT LONG TERM GOAL #3   Title  Patient will demonstrate adequate strength and range of motion to use a curling iron for hair care - one hand for comb, one hand for iron    Status  On-going      OT LONG TERM GOAL #4   Title  Patient will demonstrate adequate strength and range of motion to apply and remove seat belt, includes buckle (driver's side or passenger  side) without report of increased pain    Status  On-going      OT LONG TERM GOAL #5   Title  Patient will demonstrate adequate range of motion and consistent pressure to use BUE's to guide material through sewing machine    Status  On-going            Plan - 09/04/18 1138    Clinical Impression Statement  Pt is progressing towards goals. She demonstrates improving ROM and strength. Pt reports updated HEP is going okay.    Occupational Profile and client history currently impacting functional performance  retired Pharmacist, hospital, Psychologist, occupational, enjoys chair yoga, Radiographer, therapeutic    Occupational performance deficits (Please refer to evaluation for details):  ADL's;IADL's;Rest and Sleep;Leisure;Social Participation    Rehab Potential  Good    Current Impairments/barriers affecting progress:  pain, fear, need to establish trust     OT Frequency  2x / week    OT Duration  8 weeks    OT Treatment/Interventions  Self-care/ADL training;Therapeutic exercise;Functional Mobility Training;Balance training;Aquatic Therapy;Ultrasound;Neuromuscular education;Manual Therapy;Splinting;Energy conservation;Therapeutic activities;Coping strategies training;DME and/or AE instruction;Cognitive remediation/compensation;Electrical Stimulation;Scar mobilization;Moist Heat;Cryotherapy;Passive range of motion;Patient/family education    Plan  continue ROM, and light strengthening, issue updated HEP when possible       Patient will benefit from skilled therapeutic intervention in order to improve the following deficits and impairments:  Decreased endurance, Decreased skin integrity, Increased muscle spasms, Decreased coping skills, Decreased knowledge of precautions, Decreased scar mobility, Improper body mechanics, Decreased activity tolerance, Decreased knowledge of use of DME, Decreased strength, Impaired flexibility, Decreased balance, Decreased mobility, Difficulty walking, Decreased cognition, Decreased range of  motion, Increased edema, Pain, Decreased coordination, Decreased safety awareness, Increased fascial restrictions, Impaired UE functional use  Visit Diagnosis: Stiffness of right shoulder, not elsewhere classified  Pain in right upper arm  Muscle weakness (generalized)  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 Fall at home, initial encounter 07/06/2018  . Displaced spiral fracture of shaft of humerus, right arm, initial encounter for closed fracture 07/06/2018  . TIA (transient ischemic attack) 07/06/2018  . Allergic rhinitis 07/04/2018  . Normochromic normocytic anemia 01/20/2018  . Rheumatoid arthritis ([place]) 04/30/2017  . Lesion of left humerus 04/29/2017  .  Pericardial effusion 03/14/2017  . Diabetes mellitus ([place]) 03/14/2017  . Essential hypertension 03/14/2017  . Mixed hyperlipidemia 03/14/2017  . 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 Meralgia paresthetica of right side 01/10/2017  . Allergic rhinoconjunctivitis 07/09/2015  . Asthma, well controlled 07/09/2015  . Laryngopharyngeal reflux (LPR) 07/09/2015    Abelardo Seidner 09/04/2018, 12:49 PM  [facility] [address] [place], [place], [address] Phone: [phone]   Fax:  [phone]  Name: MALYNDA SMOLINSKI MRN: [id number] Date of Birth: June 22, 1956

## 2018-09-04 NOTE — Therapy (Signed)
[facility] [address] [place], [place], [address] Phone: [phone]   Fax:  [phone]  Physical Therapy Treatment  Patient Details  Name: Jocelyn Kaufman MRN: [id number] Date of Birth: 1956/06/02 Referring Provider (PT): Dr. Shelia Media   Encounter Date: 09/04/2018  PT End of Session - 09/04/18 1208    Visit Number  11    Number of Visits  17    Date for PT Re-Evaluation  09/23/18    Authorization Type  BCBS    PT Start Time  1022    PT Stop Time  1102    PT Time Calculation (min)  40 min    Equipment Utilized During Treatment  Gait belt    Activity Tolerance  Patient tolerated treatment well    Behavior During Therapy  [facility] for tasks assessed/performed       Past Medical History:  Diagnosis Date  . Adrenal adenoma   . 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 Depression   . Diabetes mellitus without complication ([place])   . Hyperlipidemia   . Hypertension   . Hypothyroidism   . Meralgia paresthetica of right side 01/10/2017  . Migraine   . Pericardial effusion   . 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 APPENDECTOMY    . CYST REMOVAL LEG Right    Removed from right thigh joint  . ORIF HUMERUS FRACTURE Right 07/09/2018   Procedure: OPEN REDUCTION INTERNAL FIXATION (ORIF) HUMERAL SHAFT FRACTURE;  Surgeon: Hiram Gash, MD;  Location: [facility];  Service: Orthopedics;  Laterality: Right;  . TONSILLECTOMY    . TOTAL VAGINAL HYSTERECTOMY    . UMBILICAL HERNIA REPAIR      There were no vitals filed for this visit.  Subjective Assessment - 09/04/18 1026    Subjective  The patient notes she is not doing the single leg stance exercise at home b/c it is uncomfortable.  The dizziness is a little bit better, she is still having the floaters in the left eye, which throw her off.  She is  not using the cane at all.  She keeps it in the car.  She has established care with  cardiologist.    Pertinent History  Cushings, DM, HTN, meralgia paresthetica, Migraine, pericardial effusion, TIA (at time of fall)    Patient Stated Goals  "fix the inner ear, reduce need for cane, feel comfortable with mobility"    Currently in Pain?  No/denies   "I know it is there, but the pain is not even registering."                      [facility] Adult PT Treatment/Exercise - 09/04/18 1209      Ambulation/Gait   Ambulation/Gait  Yes    Ambulation/Gait Assistance  6: Modified independent (Device/Increase time);5: Supervision    Ambulation/Gait Assistance Details  The patient ambulates into clinic without a device mod indep (slowed pace with guarded trunk motion).  When performing dynamic gait activitites with horizontal and vertical head motion, the patient requires close supervision.  She has occasional imbalance and is able to stop, realign to midline before continuing gait.      Ambulation Distance (Feet)  400 Feet    Assistive device  None    Gait Pattern  Step-through pattern;Decreased stride length;Trunk flexed    Ambulation Surface  Level;Indoor      Neuro Re-ed    Neuro  Re-ed Details   Gaze x 1 viewing performed in a standing position near UE support if needed.  Initiated standing with slowed pace (patient self regulated), initially x 10 reps, and then for 30 seconds.  The patient performed a review of all prior HEP including marching, single leg stance discussing ways to tolerate this at home.        Exercises   Exercises  Other Exercises    Other Exercises   Reviewed prior HEP including neck AROM, standing hip abduction with correction in hip hiking.               PT Education - 09/04/18 1207    Education Details  CONSOLIDATED HEP to be on one sheet.      Person(s) Educated  Patient    Methods  Explanation;Demonstration;Handout    Comprehension  Verbalized understanding;Returned demonstration       PT Short Term Goals - 08/26/18 1418      PT  SHORT TERM GOAL #1   Title  patient to be independent with HEP for vestibular and balance activities    Baseline  10/29: Patient reports that she was performing her HEP exercises before a blood vessel burst in her L eye on 10/25    Time  4    Period  Weeks    Status  Achieved      PT SHORT TERM GOAL #2   Title  patient to improve DGI to >/= 15/24    Baseline  10/29: 17/24 indicative of high fall risk    Time  4    Period  Weeks    Status  Achieved      PT SHORT TERM GOAL #3   Title  patient to improve gait speed to >/= 3.5 ft/sec with LRAD at Mod I level over level indoor/outdoor surfaces    Baseline  10/29: 3.93 ft/sec with no AD    Time  4    Period  Weeks    Status  Achieved        PT Long Term Goals - 08/06/18 0737      PT LONG TERM GOAL #1   Title  patient to be independent with advanced HEP     Time  8    Period  Weeks    Status  New      PT LONG TERM GOAL #2   Title  patient to improve gait speed to >/= 4.37 ft/sec with LRAD at IND over various indoor and outdoor surfaces    Time  8    Period  Weeks    Status  New      PT LONG TERM GOAL #3   Title  patient to improve DGI to >/= 19/24 demonstrating reduced fall risk    Time  8    Period  Weeks    Status  New      PT LONG TERM GOAL #4   Title  patient to report reduction in symptom frequency, duration, intensity by >/= 50%    Time  8    Period  Weeks    Status  New      PT LONG TERM GOAL #5   Title  Patient to improve SOT composite score to WNL for age    Baseline  60    Time  8    Period  Weeks    Status  Revised            Plan - 09/04/18 1213  Clinical Impression Statement  The patient tolerated standing gaze at self regulated pace today without increased nausea, but with some increase in L side "pulling".  She has some imbalance with dynamic gait activities.  PT working towards remaining LTGs and can encourage return to prior community activities ( chair yoga) as the patient will tolerate.      PT Treatment/Interventions  ADLs/Self Care Home Management;Canalith Repostioning;DME Instruction;Balance training;Therapeutic exercise;Therapeutic activities;Functional mobility training;Stair training;Gait training;Neuromuscular re-education;Patient/family education;Manual techniques;Passive range of motion;Vestibular;Visual/perceptual remediation/compensation    PT Next Visit Plan  Check to see how modifications to SLS went for the patient.  Check to see how gaze x 1 is going.  Dynamic gait, begin working to transition to prior community exercise (chair yoga) as tolerated.      PT Home Exercise Plan  *COMBINED BOTH HEPs to [id number]**    Consulted and Agree with Plan of Care  Patient       Patient will benefit from skilled therapeutic intervention in order to improve the following deficits and impairments:  Decreased activity tolerance, Dizziness, Pain, Postural dysfunction, Decreased strength, Decreased balance  Visit Diagnosis: Muscle weakness (generalized)  Dizziness and giddiness  Unsteadiness on feet     Problem List Patient Active Problem List   Diagnosis Date Noted  . Fall at home, initial encounter 07/06/2018  . Displaced spiral fracture of shaft of humerus, right arm, initial encounter for closed fracture 07/06/2018  . TIA (transient ischemic attack) 07/06/2018  . Allergic rhinitis 07/04/2018  . Normochromic normocytic anemia 01/20/2018  . Rheumatoid arthritis ([place]) 04/30/2017  . Lesion of left humerus 04/29/2017  . Pericardial effusion 03/14/2017  . Diabetes mellitus ([place]) 03/14/2017  . Essential hypertension 03/14/2017  . Mixed hyperlipidemia 03/14/2017  . 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 Meralgia paresthetica of right side 01/10/2017  . Allergic rhinoconjunctivitis 07/09/2015  . Asthma, well controlled 07/09/2015  . Laryngopharyngeal reflux (LPR) 07/09/2015    Nakeda Lebron, PT 09/04/2018, 12:17 PM  [facility] [address] [place], [place], [address] Phone: [phone]   Fax:  [phone]  Name: Jocelyn Kaufman MRN: [id number] Date of Birth: June 12, 1956

## 2018-09-05 ENCOUNTER — Encounter: Payer: Self-pay | Admitting: Internal Medicine

## 2018-09-09 ENCOUNTER — Encounter: Payer: Self-pay | Admitting: Physical Therapy

## 2018-09-09 ENCOUNTER — Ambulatory Visit: Payer: BC Managed Care – PPO | Admitting: Physical Therapy

## 2018-09-09 ENCOUNTER — Ambulatory Visit: Payer: BC Managed Care – PPO | Admitting: Occupational Therapy

## 2018-09-09 DIAGNOSIS — R2681 Unsteadiness on feet: Secondary | ICD-10-CM

## 2018-09-09 DIAGNOSIS — M6281 Muscle weakness (generalized): Secondary | ICD-10-CM

## 2018-09-09 DIAGNOSIS — M25611 Stiffness of right shoulder, not elsewhere classified: Secondary | ICD-10-CM | POA: Diagnosis not present

## 2018-09-09 DIAGNOSIS — M79621 Pain in right upper arm: Secondary | ICD-10-CM

## 2018-09-09 NOTE — Therapy (Signed)
[facility] [address] [place], [place], [address] Phone: [phone]   Fax:  [phone]  Physical Therapy Treatment  Patient Details  Name: Jocelyn Kaufman MRN: [id number] Date of Birth: [birth year] Referring Provider (PT): Dr. Shelia Media   Encounter Date: 09/09/2018  PT End of Session - 09/09/18 1224    Visit Number  12    Number of Visits  17    Date for PT Re-Evaluation  09/23/18    Authorization Type  BCBS    PT Start Time  1107   arrived late   PT Stop Time  1148    PT Time Calculation (min)  41 min    Activity Tolerance  Patient tolerated treatment well    Behavior During Therapy  [facility] for tasks assessed/performed       Past Medical History:  Diagnosis Date  . Adrenal adenoma   . 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 Depression   . Diabetes mellitus without complication ([place])   . Hyperlipidemia   . Hypertension   . Hypothyroidism   . Meralgia paresthetica of right side 01/10/2017  . Migraine   . Pericardial effusion   . 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 APPENDECTOMY    . CYST REMOVAL LEG Right    Removed from right thigh joint  . ORIF HUMERUS FRACTURE Right 07/09/2018   Procedure: OPEN REDUCTION INTERNAL FIXATION (ORIF) HUMERAL SHAFT FRACTURE;  Surgeon: Hiram Gash, MD;  Location: [facility];  Service: Orthopedics;  Laterality: Right;  . TONSILLECTOMY    . TOTAL VAGINAL HYSTERECTOMY    . UMBILICAL HERNIA REPAIR      There were no vitals filed for this visit.  Subjective Assessment - 09/09/18 1109    Subjective  Found out yesterday that both my eyes have floaters and I'm going to need to get injections and then ultimately have to have laser surgery. For post-discharge is considering chair yoga. Has some concerns re: her rt shoulder and chair yoga (directed to discuss further with OT)    Pertinent History  Cushings, DM, HTN, meralgia paresthetica,  Migraine, pericardial effusion, TIA (at time of fall)    Patient Stated Goals  "fix the inner ear, reduce need for cane, feel comfortable with mobility"    Currently in Pain?  Yes    Pain Score  3     Pain Location  Neck    Pain Orientation  Right    Pain Descriptors / Indicators  Guarding;Tender    Pain Type  Acute pain    Pain Onset  In the past 7 days    Pain Frequency  Intermittent    Aggravating Factors   the way she holds her arm sometimes "like it's still in a sling"    Pain Relieving Factors  nothing much recently has helped                       [facility] Adult PT Treatment/Exercise - 09/09/18 1214      Ambulation/Gait   Ambulation/Gait Assistance  7: Independent    Ambulation/Gait Assistance Details  good arm swing, symmetrical step length    Ambulation Distance (Feet)  200 Feet    Assistive device  None    Gait Pattern  Within Functional Limits    Ambulation Surface  Level;Indoor      Exercises   Other Exercises   left step ups with  rt foot tap to 6" step, no UE support x 10; same with RLE x2 but too painful and stopped; attempted standing on step sideways on RLE with LLE hanging off step and lowering/raising left hemi-pelvis-pt with difficulty with this motion, but ultimately gained ROM and completed x 10          Balance Exercises - 09/09/18 1221      Balance Exercises: Standing   Standing Eyes Opened  Narrow base of support (BOS);Wide (BOA);Head turns;Foam/compliant surface   rt foot on 1" foam; lt foot on green theraband foam;    Standing Eyes Closed  Narrow base of support (BOS);Wide (BOA);Head turns;Foam/compliant surface   rt foot on 1" foam; lt foot on green theraband foam;    Gait with Head Turns  --   each diagonal x 40 ft with no dizziness or imbalance;       Self-Care-reviewed Patient goals she verbalized on evaluation and pt feels we have met those goals. Discussed any issues she is continuing to have with mobility and balance. She  reports she walked a long way from church to her car the other day and was fatigued, but did not feel unsteady. Notes she has not used her cane in some time. Discussed option of returning to chair yoga class she enjoyed and she would like to do this (mentioning she feels it will help her mental health as much as her physical health). Referred her to discuss with OT any restrictions they would recommend for return to chair yoga.    PT Short Term Goals - 08/26/18 1418      PT SHORT TERM GOAL #1   Title  patient to be independent with HEP for vestibular and balance activities    Baseline  10/29: Patient reports that she was performing her HEP exercises before a blood vessel burst in her L eye on 10/25    Time  4    Period  Weeks    Status  Achieved      PT SHORT TERM GOAL #2   Title  patient to improve DGI to >/= 15/24    Baseline  10/29: 17/24 indicative of high fall risk    Time  4    Period  Weeks    Status  Achieved      PT SHORT TERM GOAL #3   Title  patient to improve gait speed to >/= 3.5 ft/sec with LRAD at Mod I level over level indoor/outdoor surfaces    Baseline  10/29: 3.93 ft/sec with no AD    Time  4    Period  Weeks    Status  Achieved        PT Long Term Goals - 08/06/18 3532      PT LONG TERM GOAL #1   Title  patient to be independent with advanced HEP     Time  8    Period  Weeks    Status  New      PT LONG TERM GOAL #2   Title  patient to improve gait speed to >/= 4.37 ft/sec with LRAD at IND over various indoor and outdoor surfaces    Time  8    Period  Weeks    Status  New      PT LONG TERM GOAL #3   Title  patient to improve DGI to >/= 19/24 demonstrating reduced fall risk    Time  8    Period  Weeks    Status  New      PT LONG TERM GOAL #4   Title  patient to report reduction in symptom frequency, duration, intensity by >/= 50%    Time  8    Period  Weeks    Status  New      PT LONG TERM GOAL #5   Title  Patient to improve SOT composite  score to WNL for age    Baseline  6    Time  8    Period  Weeks    Status  Revised            Plan - 09/09/18 1226    Clinical Impression Statement  Patient doing very well with balance and vestibular challenges this date. No dizziness throughout session and no imbalance. Discussed with pt if she feels she is feeling close to her baseline in terms of balance. She reports her episodes of dizziness are much less frequent (maybe once per day). She is not sure of a specific trigger, but just waits for a moment and it passes. (? if these episodes are related to her arrhythmias--pt reports she has never felt the "skipped beats." We discussed discharging early (prior to predicted 17 visits) and pt agrees with this plan. Again discussed return to chair yoga and referred her concerns re: her shoulder to OT to discuss. Will check LTGs next visit and plan to discharge   PT Frequency  2x / week    PT Duration  8 weeks    PT Treatment/Interventions  ADLs/Self Care Home Management;Canalith Repostioning;DME Instruction;Balance training;Therapeutic exercise;Therapeutic activities;Functional mobility training;Stair training;Gait training;Neuromuscular re-education;Patient/family education;Manual techniques;Passive range of motion;Vestibular;Visual/perceptual remediation/compensation    PT Next Visit Plan  CHeck LTGs (+/- SOT) and discharge    PT Home Exercise Plan  *COMBINED BOTH HEPs to [id number]**    Consulted and Agree with Plan of Care  Patient       Patient will benefit from skilled therapeutic intervention in order to improve the following deficits and impairments:  Decreased activity tolerance, Dizziness, Pain, Postural dysfunction, Decreased strength, Decreased balance  Visit Diagnosis: Unsteadiness on feet     Problem List Patient Active Problem List   Diagnosis Date Noted  . Fall at home, initial encounter 07/06/2018  . Displaced spiral fracture of shaft of humerus, right arm, initial  encounter for closed fracture 07/06/2018  . TIA (transient ischemic attack) 07/06/2018  . Allergic rhinitis 07/04/2018  . Normochromic normocytic anemia 01/20/2018  . Rheumatoid arthritis ([place]) 04/30/2017  . Lesion of left humerus 04/29/2017  . Pericardial effusion 03/14/2017  . Diabetes mellitus ([place]) 03/14/2017  . Essential hypertension 03/14/2017  . Mixed hyperlipidemia 03/14/2017  . 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 Meralgia paresthetica of right side 01/10/2017  . Allergic rhinoconjunctivitis 07/09/2015  . Asthma, well controlled 07/09/2015  . Laryngopharyngeal reflux (LPR) 07/09/2015    Rexanne Mano, PT 09/09/2018, 12:55 PM  [facility] [address] [place], [place], [address] Phone: [phone]   Fax:  [phone]  Name: LARSEN ZETTEL MRN: [id number] Date of Birth: [birth year]

## 2018-09-09 NOTE — Therapy (Signed)
[facility] [address] [place], [place], [address] Phone: [phone]   Fax:  [phone]  Occupational Therapy Treatment  Patient Details  Name: Jocelyn Kaufman MRN: [id number] Date of Birth: 02-16-1956 Referring Provider (OT): Deland Pretty   Encounter Date: 09/09/2018  OT End of Session - 09/09/18 1257    Visit Number  12    Number of Visits  21    Date for OT Re-Evaluation  09/28/18    Authorization Type  BCBS, VL=[place]    OT Start Time  1150    OT Stop Time  1230    OT Time Calculation (min)  40 min    Activity Tolerance  Patient tolerated treatment well    Behavior During Therapy  [facility] for tasks assessed/performed       Past Medical History:  Diagnosis Date  . Adrenal adenoma   . 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 Depression   . Diabetes mellitus without complication ([place])   . Hyperlipidemia   . Hypertension   . Hypothyroidism   . Meralgia paresthetica of right side 01/10/2017  . Migraine   . Pericardial effusion   . 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 APPENDECTOMY    . CYST REMOVAL LEG Right    Removed from right thigh joint  . ORIF HUMERUS FRACTURE Right 07/09/2018   Procedure: OPEN REDUCTION INTERNAL FIXATION (ORIF) HUMERAL SHAFT FRACTURE;  Surgeon: Hiram Gash, MD;  Location: [facility];  Service: Orthopedics;  Laterality: Right;  . TONSILLECTOMY    . TOTAL VAGINAL HYSTERECTOMY    . UMBILICAL HERNIA REPAIR      There were no vitals filed for this visit.  Subjective Assessment - 09/09/18 1255    Subjective   Pt wants to know if she can start chair yoga    Pertinent History  surgery RUE 07/09/18    Limitations  Pt is now cleared for ROM as tolerated, and gentle strengthening as tolerated    Currently in Pain?  Yes    Pain Score  --   Fluctuates w/ certain movements   Pain Location  Shoulder    Pain Orientation  Right    Pain Descriptors /  Indicators  Guarding;Tender    Pain Onset  More than a month ago    Pain Frequency  Intermittent    Aggravating Factors   certain movements    Pain Relieving Factors  rest       Supine: BUE shoulder flexion with 2 lb weight (palms facing each other), followed by unilateral Rt sh flexion with 1 lb weight x 10 reps each.  Seated: Active Rt shoulder abduction w/ max difficulty x 5 reps. Sh. Ladder x 7 reps.  Standing: BUE IR w/ dowel, followed by unilateral IR RUE. Light modified wt bearing over UE's in standing over table w/ wt shifts (feet slightly back outside BOS). Pt shown how to modify using yoga blocks for wrist pain. Pt also performed modified push ups at wall. Recommended pt not do chair yoga until pt can tolerate and further progress with modified wt bearing ex's today.                       OT Short Term Goals - 09/02/18 1527      OT SHORT TERM GOAL #1   Title  Patient will complete an HEP designed to increase elbow through hand AROM  DUE 08/29/2018    Status  Achieved      OT SHORT TERM GOAL #2   Title  Patient will demonstrate knowledge and effective technique for scar mobilization and management    Status  Achieved      OT SHORT TERM GOAL #3   Title  Patient will demonstrate understanding of safe transition out of sling schedule    Status  Achieved      OT SHORT TERM GOAL #4   Title  Patient will complete HEP designed for self ROM for right shoulder    Status  Achieved      OT SHORT TERM GOAL #5   Title  Patient will incorporate right hand into daily functional tasks as able, incorporating elbow, flroearm, wrist and hand motion, e.g. writing, feeding, oral hygiene, aspects of bathing, dresisng    Status  Achieved        OT Long Term Goals - 08/20/18 1540      OT LONG TERM GOAL #1   Title  Patient will complete update HEP designed to improve AROM RUE DUE 09/28/18    Status  On-going      OT LONG TERM GOAL #2   Title  Patient will demonstrate  adequate shoulder range of motion and strength to obtain / place a lightweight (LESS THAN 2LB) object on chest height shelf with RUE    Status  On-going      OT LONG TERM GOAL #3   Title  Patient will demonstrate adequate strength and range of motion to use a curling iron for hair care - one hand for comb, one hand for iron    Status  On-going      OT LONG TERM GOAL #4   Title  Patient will demonstrate adequate strength and range of motion to apply and remove seat belt, includes buckle (driver's side or passenger side) without report of increased pain    Status  On-going      OT LONG TERM GOAL #5   Title  Patient will demonstrate adequate range of motion and consistent pressure to use BUE's to guide material through sewing machine    Status  On-going            Plan - 09/09/18 1257    Clinical Impression Statement  Pt is progressing towards goals. She demonstrates improving ROM and strength.     Occupational Profile and client history currently impacting functional performance  retired Pharmacist, hospital, Psychologist, occupational, enjoys chair yoga, Radiographer, therapeutic    Occupational performance deficits (Please refer to evaluation for details):  ADL's;IADL's;Rest and Sleep;Leisure;Social Participation    Rehab Potential  Good    Current Impairments/barriers affecting progress:  pain, fear, need to establish trust     OT Frequency  2x / week    OT Duration  8 weeks    OT Treatment/Interventions  Self-care/ADL training;Therapeutic exercise;Functional Mobility Training;Balance training;Aquatic Therapy;Ultrasound;Neuromuscular education;Manual Therapy;Splinting;Energy conservation;Therapeutic activities;Coping strategies training;DME and/or AE instruction;Cognitive remediation/compensation;Electrical Stimulation;Scar mobilization;Moist Heat;Cryotherapy;Passive range of motion;Patient/family education    Plan  continue ROM, and light strengthening, light weight bearing - issue updated HEP (in prep for chair  yoga)     Consulted and Agree with Plan of Care  Patient       Patient will benefit from skilled therapeutic intervention in order to improve the following deficits and impairments:  Decreased endurance, Decreased skin integrity, Increased muscle spasms, Decreased coping skills, Decreased knowledge of precautions, Decreased scar mobility, Improper body mechanics, Decreased activity tolerance, Decreased knowledge  of use of DME, Decreased strength, Impaired flexibility, Decreased balance, Decreased mobility, Difficulty walking, Decreased cognition, Decreased range of motion, Increased edema, Pain, Decreased coordination, Decreased safety awareness, Increased fascial restrictions, Impaired UE functional use  Visit Diagnosis: Muscle weakness (generalized)  Stiffness of right shoulder, not elsewhere classified  Pain in right upper arm    Problem List Patient Active Problem List   Diagnosis Date Noted  . Fall at home, initial encounter 07/06/2018  . Displaced spiral fracture of shaft of humerus, right arm, initial encounter for closed fracture 07/06/2018  . TIA (transient ischemic attack) 07/06/2018  . Allergic rhinitis 07/04/2018  . Normochromic normocytic anemia 01/20/2018  . Rheumatoid arthritis ([place]) 04/30/2017  . Lesion of left humerus 04/29/2017  . Pericardial effusion 03/14/2017  . Diabetes mellitus ([place]) 03/14/2017  . Essential hypertension 03/14/2017  . Mixed hyperlipidemia 03/14/2017  . 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 Meralgia paresthetica of right side 01/10/2017  . Allergic rhinoconjunctivitis 07/09/2015  . Asthma, well controlled 07/09/2015  . Laryngopharyngeal reflux (LPR) 07/09/2015    Carey Bullocks, OTR/L 09/09/2018, 1:01 PM  [facility] [address] [place], [place], [address] Phone: [phone]   Fax:  [phone]  Name: Jocelyn Kaufman MRN: [id number] Date of Birth: [birth year]

## 2018-09-11 ENCOUNTER — Ambulatory Visit: Payer: BC Managed Care – PPO | Admitting: Occupational Therapy

## 2018-09-11 ENCOUNTER — Ambulatory Visit: Payer: BC Managed Care – PPO | Admitting: Rehabilitative and Restorative Service Providers"

## 2018-09-11 ENCOUNTER — Encounter: Payer: Self-pay | Admitting: Rehabilitative and Restorative Service Providers"

## 2018-09-11 DIAGNOSIS — R2681 Unsteadiness on feet: Secondary | ICD-10-CM

## 2018-09-11 DIAGNOSIS — R42 Dizziness and giddiness: Secondary | ICD-10-CM

## 2018-09-11 DIAGNOSIS — M25611 Stiffness of right shoulder, not elsewhere classified: Secondary | ICD-10-CM

## 2018-09-11 DIAGNOSIS — M79621 Pain in right upper arm: Secondary | ICD-10-CM

## 2018-09-11 DIAGNOSIS — M6281 Muscle weakness (generalized): Secondary | ICD-10-CM

## 2018-09-11 NOTE — Therapy (Signed)
[facility] [address] [place], [place], [address] Phone: [phone]   Fax:  [phone]  Physical Therapy Treatment and Discharge Summary  Patient Details  Name: Jocelyn Kaufman MRN: [id number] Date of Birth: [birth year] Referring Provider (PT): Dr. Shelia Media   Encounter Date: 09/11/2018  PT End of Session - 09/11/18 1444    Visit Number  13    Number of Visits  17    Date for PT Re-Evaluation  09/23/18    Authorization Type  BCBS    PT Start Time  1325    PT Stop Time  1403    PT Time Calculation (min)  38 min    Activity Tolerance  Patient tolerated treatment well    Behavior During Therapy  [facility] for tasks assessed/performed       Past Medical History:  Diagnosis Date  . Adrenal adenoma   . 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)   . Depression   . Diabetes mellitus without complication ([place])   . Hyperlipidemia   . Hypertension   . Hypothyroidism   . Meralgia paresthetica of right side 01/10/2017  . Migraine   . Pericardial effusion   . 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 APPENDECTOMY    . CYST REMOVAL LEG Right    Removed from right thigh joint  . ORIF HUMERUS FRACTURE Right 07/09/2018   Procedure: OPEN REDUCTION INTERNAL FIXATION (ORIF) HUMERAL SHAFT FRACTURE;  Surgeon: Hiram Gash, MD;  Location: [facility];  Service: Orthopedics;  Laterality: Right;  . TONSILLECTOMY    . TOTAL VAGINAL HYSTERECTOMY    . UMBILICAL HERNIA REPAIR      There were no vitals filed for this visit.  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  Side to side movements appear to be more provocative.      Pertinent History  [place], DM, HTN, meralgia paresthetica, Migraine, pericardial effusion, TIA (at  time of fall)    Patient Stated Goals  "fix the inner ear, reduce need for cane, feel comfortable with mobility"    Currently in Pain?  Yes    Pain Score  3     Pain Location  Shoulder    Pain Onset  More than a month ago    Pain Frequency  Intermittent    Aggravating Factors   certain movements    Pain Relieving Factors  rest         [facility] PT Assessment - 09/11/18 1327      Dynamic Gait Index   Level Surface  Normal    Change in Gait Speed  Normal    Gait with Horizontal Head Turns  Mild Impairment    Gait with Vertical Head Turns  Normal    Gait and Pivot Turn  Moderate Impairment    Step Over Obstacle  Mild Impairment    Step Around Obstacles  Normal    Steps  Mild Impairment    Total Score  19    DGI comment:  19/24, improved from 12/24                   [facility] Adult PT Treatment/Exercise - 09/11/18 1444      Self-Care   Self-Care  Other Self-Care Comments  Other Self-Care Comments   Discussed continued progression of HEP post d/c.       Neuro Re-ed    Neuro Re-ed Details   [employer] Testing improving score from 60% up to 74%.   Patient with mildly dec'd performance on condition 2 (eyes closed, stable support), and therefore showed mild decrease in use of somatosensory feedback for balance (by 2% compared to normative levels).   360 degree turns near support surface with intermittent UE support.               PT Education - 09/11/18 1443    Education Details  Added turning in one place to HEP    Person(s) Educated  Patient    Methods  Explanation;Demonstration;Handout    Comprehension  Verbalized understanding;Returned demonstration       PT Short Term Goals - 08/26/18 1418      PT SHORT TERM GOAL #1   Title  patient to be independent with HEP for vestibular and balance activities    Baseline  10/29: Patient reports that she was performing her HEP exercises before a blood vessel burst in her L eye on 10/25    Time  4    Period   Weeks    Status  Achieved      PT SHORT TERM GOAL #2   Title  patient to improve DGI to >/= 15/24    Baseline  10/29: 17/24 indicative of high fall risk    Time  4    Period  Weeks    Status  Achieved      PT SHORT TERM GOAL #3   Title  patient to improve gait speed to >/= 3.5 ft/sec with LRAD at Mod I level over level indoor/outdoor surfaces    Baseline  10/29: 3.93 ft/sec with no AD    Time  4    Period  Weeks    Status  Achieved        PT Long Term Goals - 09/11/18 1326      PT LONG TERM GOAL #1   Title  patient to be independent with advanced HEP     Time  8    Period  Weeks    Status  Achieved      PT LONG TERM GOAL #2   Title  patient to improve gait speed to >/= 4.37 ft/sec with LRAD at IND over various indoor and outdoor surfaces    Baseline  3.93 ft/sec at last assessment    Time  8    Period  Weeks    Status  Partially Met      PT LONG TERM GOAL #3   Title  patient to improve DGI to >/= 19/24 demonstrating reduced fall risk    Baseline  19/24    Time  8    Period  Weeks    Status  Achieved      PT LONG TERM GOAL #4   Title  patient to report reduction in symptom frequency, duration, intensity by >/= 50%    Baseline  Improved by 70% per subjective report    Time  8    Period  Weeks    Status  Achieved      PT LONG TERM GOAL #5   Title  Patient to improve SOT composite score to WNL for age    Baseline  60 improved to 74%.    Time  8    Period  Weeks    Status  Achieved            Plan - 09/11/18 1507    Clinical Impression Statement  The patient has met LTGs and has HEP to address remaining deficits.  Discharge today.     PT Treatment/Interventions  ADLs/Self Care Home Management;Canalith Repostioning;DME Instruction;Balance training;Therapeutic exercise;Therapeutic activities;Functional mobility training;Stair training;Gait training;Neuromuscular re-education;Patient/family education;Manual techniques;Passive range of  motion;Vestibular;Visual/perceptual remediation/compensation    PT Next Visit Plan  Discharge    Consulted and Agree with Plan of Care  Patient       Patient will benefit from skilled therapeutic intervention in order to improve the following deficits and impairments:  Decreased activity tolerance, Dizziness, Pain, Postural dysfunction, Decreased strength, Decreased balance  Visit Diagnosis: Muscle weakness (generalized)  Unsteadiness on feet  Dizziness and giddiness    PHYSICAL THERAPY DISCHARGE SUMMARY  Visits from Start of Care: 13  Current functional level related to goals / functional outcomes: See above   Remaining deficits: Intermittent instability with turns addressed through HEP   Education / Equipment: HEP, home safety.  Plan: Patient agrees to discharge.  Patient goals were met. Patient is being discharged due to meeting the stated rehab goals.  ?????        Thank you for the referral of this patient. Rudell Cobb, MPT  [place], PT 09/11/2018, 3:08 PM  [facility] [address] [place], [place], [address] Phone: [phone]   Fax:  [phone]  Name: Jocelyn Kaufman MRN: [id number] Date of Birth: [birth year]

## 2018-09-11 NOTE — Patient Instructions (Signed)
Turning in Place: Solid Surface    Standing in place, lead with head and turn slowly making full turns toward the left. Repeat __3__ times to each side, resting in between each repetition.  Do 2 sessions per day.  Copyright  VHI. All rights reserved.

## 2018-09-11 NOTE — Therapy (Signed)
[facility] [address] [place], [place], [address] Phone: [phone]   Fax:  [phone]  Occupational Therapy Treatment  Patient Details  Name: Jocelyn Kaufman MRN: [id number] Date of Birth: 1956-03-03 Referring Provider (OT): Deland Pretty   Encounter Date: 09/11/2018  OT End of Session - 09/11/18 1649    Visit Number  13    Number of Visits  21    Date for OT Re-Evaluation  09/28/18    Authorization Type  BCBS, VL=[place]    OT Start Time  1406    OT Stop Time  1445    OT Time Calculation (min)  39 min    Activity Tolerance  Patient tolerated treatment well    Behavior During Therapy  [facility] for tasks assessed/performed       Past Medical History:  Diagnosis Date  . Adrenal adenoma   . 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 Depression   . Diabetes mellitus without complication ([place])   . Hyperlipidemia   . Hypertension   . Hypothyroidism   . Meralgia paresthetica of right side 01/10/2017  . Migraine   . Pericardial effusion   . 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 APPENDECTOMY    . CYST REMOVAL LEG Right    Removed from right thigh joint  . ORIF HUMERUS FRACTURE Right 07/09/2018   Procedure: OPEN REDUCTION INTERNAL FIXATION (ORIF) HUMERAL SHAFT FRACTURE;  Surgeon: Hiram Gash, MD;  Location: [facility];  Service: Orthopedics;  Laterality: Right;  . TONSILLECTOMY    . TOTAL VAGINAL HYSTERECTOMY    . UMBILICAL HERNIA REPAIR      There were no vitals filed for this visit.  Subjective Assessment - 09/11/18 1410    Limitations  Pt is now cleared for ROM as tolerated, and gentle strengthening as tolerated    Currently in Pain?  Yes    Pain Score  3     Pain Location  Shoulder    Pain Orientation  Right    Pain Descriptors / Indicators  Aching    Pain Type  Acute pain    Pain Onset  More than a month ago    Pain Frequency  Intermittent    Aggravating Factors    certain movements    Pain Relieving Factors  rest           Treatment: supine closed chain shoulder flexion, chest press, followed by closed chain shoulder flexion with bilateral UE's with a 2 lbs weight, min v.c Unilateral UE AA/ROM shoulder flexion and abduction within tolerated ROM. Pt performed weightbearing at table through yoga blocks rocking forwards and backwards. A/ROM wrist flexion extension with 1 lbs weight, updated HEP issued.                OT Education - 09/11/18 1700    Education Details  wrist / forearm ROM    Person(s) Educated  Patient    Methods  Handout;Explanation    Comprehension  Verbalized understanding       OT Short Term Goals - 09/02/18 1527      OT SHORT TERM GOAL #1   Title  Patient will complete an HEP designed to increase elbow through hand AROM DUE 08/29/2018    Status  Achieved      OT SHORT TERM GOAL #2   Title  Patient will demonstrate knowledge and effective technique for scar mobilization and management  Status  Achieved      OT SHORT TERM GOAL #3   Title  Patient will demonstrate understanding of safe transition out of sling schedule    Status  Achieved      OT SHORT TERM GOAL #4   Title  Patient will complete HEP designed for self ROM for right shoulder    Status  Achieved      OT SHORT TERM GOAL #5   Title  Patient will incorporate right hand into daily functional tasks as able, incorporating elbow, flroearm, wrist and hand motion, e.g. writing, feeding, oral hygiene, aspects of bathing, dresisng    Status  Achieved        OT Long Term Goals - 08/20/18 1540      OT LONG TERM GOAL #1   Title  Patient will complete update HEP designed to improve AROM RUE DUE 09/28/18    Status  On-going      OT LONG TERM GOAL #2   Title  Patient will demonstrate adequate shoulder range of motion and strength to obtain / place a lightweight (LESS THAN 2LB) object on chest height shelf with RUE    Status  On-going      OT LONG  TERM GOAL #3   Title  Patient will demonstrate adequate strength and range of motion to use a curling iron for hair care - one hand for comb, one hand for iron    Status  On-going      OT LONG TERM GOAL #4   Title  Patient will demonstrate adequate strength and range of motion to apply and remove seat belt, includes buckle (driver's side or passenger side) without report of increased pain    Status  On-going      OT LONG TERM GOAL #5   Title  Patient will demonstrate adequate range of motion and consistent pressure to use BUE's to guide material through sewing machine    Status  On-going            Plan - 09/11/18 1658    Clinical Impression Statement  Pt is progressing towards goals. She demonstrates improving ROM and strength.     Rehab Potential  Good    OT Frequency  2x / week    OT Duration  8 weeks    OT Treatment/Interventions  Self-care/ADL training;Therapeutic exercise;Functional Mobility Training;Balance training;Aquatic Therapy;Ultrasound;Neuromuscular education;Manual Therapy;Splinting;Energy conservation;Therapeutic activities;Coping strategies training;DME and/or AE instruction;Cognitive remediation/compensation;Electrical Stimulation;Scar mobilization;Moist Heat;Cryotherapy;Passive range of motion;Patient/family education    Plan  continue ROM, and light strengthening, light weight bearing -     Consulted and Agree with Plan of Care  Patient       Patient will benefit from skilled therapeutic intervention in order to improve the following deficits and impairments:  Decreased endurance, Decreased skin integrity, Increased muscle spasms, Decreased coping skills, Decreased knowledge of precautions, Decreased scar mobility, Improper body mechanics, Decreased activity tolerance, Decreased knowledge of use of DME, Decreased strength, Impaired flexibility, Decreased balance, Decreased mobility, Difficulty walking, Decreased cognition, Decreased range of motion, Increased edema,  Pain, Decreased coordination, Decreased safety awareness, Increased fascial restrictions, Impaired UE functional use  Visit Diagnosis: Muscle weakness (generalized)  Stiffness of right shoulder, not elsewhere classified  Pain in right upper arm    Problem List Patient Active Problem List   Diagnosis Date Noted  . Fall at home, initial encounter 07/06/2018  . Displaced spiral fracture of shaft of humerus, right arm, initial encounter for closed fracture 07/06/2018  . TIA (transient  ischemic attack) 07/06/2018  . Allergic rhinitis 07/04/2018  . Normochromic normocytic anemia 01/20/2018  . Rheumatoid arthritis ([place]) 04/30/2017  . Lesion of left humerus 04/29/2017  . Pericardial effusion 03/14/2017  . Diabetes mellitus ([place]) 03/14/2017  . Essential hypertension 03/14/2017  . Mixed hyperlipidemia 03/14/2017  . 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 Meralgia paresthetica of right side 01/10/2017  . Allergic rhinoconjunctivitis 07/09/2015  . Asthma, well controlled 07/09/2015  . Laryngopharyngeal reflux (LPR) 07/09/2015    Dayven Linsley 09/11/2018, 5:01 PM  [facility] [address] [place], [place], [address] Phone: [phone]   Fax:  [phone]  Name: Jocelyn Kaufman MRN: [id number] Date of Birth: 30-Jul-1956

## 2018-09-11 NOTE — Patient Instructions (Signed)
AROM: Wrist Extension   .  With __right__ palm down, bend wrist up. Repeat __10_ times per set.  Do __2-3__ sessions per day.    AROM: Wrist Flexion   With__right___ palm up, bend wrist up. Repeat __10_ times per set.  Do _2-3__ sessions per day.   AROM: Forearm Pronation / Supination   With __right__ arm in handshake position, slowly rotate palm down until stretch is felt. Relax. Then rotate palm up until stretch is felt. Repeat _10__ times per set. Do _3___ sessions per day.  Copyright  VHI. All rights reserved.

## 2018-09-16 ENCOUNTER — Ambulatory Visit: Payer: BC Managed Care – PPO | Admitting: Physical Therapy

## 2018-09-16 ENCOUNTER — Ambulatory Visit: Payer: BC Managed Care – PPO | Admitting: Occupational Therapy

## 2018-09-16 DIAGNOSIS — M6281 Muscle weakness (generalized): Secondary | ICD-10-CM

## 2018-09-16 DIAGNOSIS — M25611 Stiffness of right shoulder, not elsewhere classified: Secondary | ICD-10-CM

## 2018-09-16 DIAGNOSIS — I69815 Cognitive social or emotional deficit following other cerebrovascular disease: Secondary | ICD-10-CM

## 2018-09-16 DIAGNOSIS — M79621 Pain in right upper arm: Secondary | ICD-10-CM

## 2018-09-16 NOTE — Therapy (Signed)
[facility] [address] [place], [place], [address] Phone: [phone]   Fax:  [phone]  Occupational Therapy Treatment  Patient Details  Name: Jocelyn Kaufman MRN: [id number] Date of Birth: [birth year] Referring Provider (OT): Deland Pretty   Encounter Date: 09/16/2018  OT End of Session - 09/16/18 1514    Visit Number  14    Number of Visits  21    Date for OT Re-Evaluation  09/28/18    Authorization Type  BCBS, [place]    OT Start Time  1321    OT Stop Time  1400    OT Time Calculation (min)  39 min    Activity Tolerance  Patient tolerated treatment well    Behavior During Therapy  [facility] for tasks assessed/performed       Past Medical History:  Diagnosis Date  . Adrenal adenoma   . 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 Depression   . Diabetes mellitus without complication ([place])   . Hyperlipidemia   . Hypertension   . Hypothyroidism   . Meralgia paresthetica of right side 01/10/2017  . Migraine   . Pericardial effusion   . 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 APPENDECTOMY    . CYST REMOVAL LEG Right    Removed from right thigh joint  . ORIF HUMERUS FRACTURE Right 07/09/2018   Procedure: OPEN REDUCTION INTERNAL FIXATION (ORIF) HUMERAL SHAFT FRACTURE;  Surgeon: Hiram Gash, MD;  Location: [facility];  Service: Orthopedics;  Laterality: Right;  . TONSILLECTOMY    . TOTAL VAGINAL HYSTERECTOMY    . UMBILICAL HERNIA REPAIR      There were no vitals filed for this visit.  Subjective Assessment - 09/16/18 1528    Subjective   Pt reports she went to chair yoga yesterday and it went well    Limitations  Pt is now cleared for ROM as tolerated, and gentle strengthening as tolerated    Currently in Pain?  Yes    Pain Score  3     Pain Location  Shoulder    Pain Orientation  Right    Pain Descriptors / Indicators  Aching    Pain Type  Acute pain    Pain Onset  More  than a month ago    Pain Frequency  Intermittent    Aggravating Factors   certain movements    Pain Relieving Factors  rest    Multiple Pain Sites  No             Treatment:Treatment: supine closed chain shoulder flexion, chest press, followed by  shoulder flexion with  with a 1 lbs weight, in RUE then shoulder circles each direction min v.c   [place] 1 mhz, 0.8 w./cm 2, 20% x 8 mins  To RUE incision site, upper arm for scar mobilizations, no adverse reactions Corner stretch x 10 reps min v.c, shoulder abduction and extension closed chain with cane, min v.c 10-20 reps each exercise           OT Short Term Goals - 09/02/18 1527      OT SHORT TERM GOAL #1   Title  Patient will complete an HEP designed to increase elbow through hand AROM DUE 08/29/2018    Status  Achieved      OT SHORT TERM GOAL #2   Title  Patient will demonstrate knowledge and effective technique for scar mobilization and management  Status  Achieved      OT SHORT TERM GOAL #3   Title  Patient will demonstrate understanding of safe transition out of sling schedule    Status  Achieved      OT SHORT TERM GOAL #4   Title  Patient will complete HEP designed for self ROM for right shoulder    Status  Achieved      OT SHORT TERM GOAL #5   Title  Patient will incorporate right hand into daily functional tasks as able, incorporating elbow, flroearm, wrist and hand motion, e.g. writing, feeding, oral hygiene, aspects of bathing, dresisng    Status  Achieved        OT Long Term Goals - 08/20/18 1540      OT LONG TERM GOAL #1   Title  Patient will complete update HEP designed to improve AROM RUE DUE 09/28/18    Status  On-going      OT LONG TERM GOAL #2   Title  Patient will demonstrate adequate shoulder range of motion and strength to obtain / place a lightweight (LESS THAN 2LB) object on chest height shelf with RUE    Status  On-going      OT LONG TERM GOAL #3   Title  Patient will demonstrate  adequate strength and range of motion to use a curling iron for hair care - one hand for comb, one hand for iron    Status  On-going      OT LONG TERM GOAL #4   Title  Patient will demonstrate adequate strength and range of motion to apply and remove seat belt, includes buckle (driver's side or passenger side) without report of increased pain    Status  On-going      OT LONG TERM GOAL #5   Title  Patient will demonstrate adequate range of motion and consistent pressure to use BUE's to guide material through sewing machine    Status  On-going            Plan - 09/16/18 1514    Clinical Impression Statement  Pt is progressing towards goals. She demonstrates improving ROM. She attended chair yoga yesterday and said it went okay.    Occupational Profile and client history currently impacting functional performance  retired Pharmacist, hospital, Psychologist, occupational, enjoys chair yoga, Radiographer, therapeutic    Occupational performance deficits (Please refer to evaluation for details):  ADL's;IADL's;Rest and Sleep;Leisure;Social Participation    Rehab Potential  Good    Current Impairments/barriers affecting progress:  pain, fear, need to establish trust     OT Frequency  2x / week    OT Duration  8 weeks    OT Treatment/Interventions  Self-care/ADL training;Therapeutic exercise;Functional Mobility Training;Balance training;Aquatic Therapy;Ultrasound;Neuromuscular education;Manual Therapy;Splinting;Energy conservation;Therapeutic activities;Coping strategies training;DME and/or AE instruction;Cognitive remediation/compensation;Electrical Stimulation;Scar mobilization;Moist Heat;Cryotherapy;Passive range of motion;Patient/family education    Plan  continue ROM, and light strengthening, light weight bearing -     Consulted and Agree with Plan of Care  Patient       Patient will benefit from skilled therapeutic intervention in order to improve the following deficits and impairments:  Decreased endurance, Decreased  skin integrity, Increased muscle spasms, Decreased coping skills, Decreased knowledge of precautions, Decreased scar mobility, Improper body mechanics, Decreased activity tolerance, Decreased knowledge of use of DME, Decreased strength, Impaired flexibility, Decreased balance, Decreased mobility, Difficulty walking, Decreased cognition, Decreased range of motion, Increased edema, Pain, Decreased coordination, Decreased safety awareness, Increased fascial restrictions, Impaired UE functional use  Visit Diagnosis:  Muscle weakness (generalized)  Stiffness of right shoulder, not elsewhere classified  Pain in right upper arm  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 Fall at home, initial encounter 07/06/2018  . Displaced spiral fracture of shaft of humerus, right arm, initial encounter for closed fracture 07/06/2018  . TIA (transient ischemic attack) 07/06/2018  . Allergic rhinitis 07/04/2018  . Normochromic normocytic anemia 01/20/2018  . Rheumatoid arthritis ([place]) 04/30/2017  . Lesion of left humerus 04/29/2017  . Pericardial effusion 03/14/2017  . Diabetes mellitus ([place]) 03/14/2017  . Essential hypertension 03/14/2017  . Mixed hyperlipidemia 03/14/2017  . 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 Meralgia paresthetica of right side 01/10/2017  . Allergic rhinoconjunctivitis 07/09/2015  . Asthma, well controlled 07/09/2015  . Laryngopharyngeal reflux (LPR) 07/09/2015    Collin Rengel 09/16/2018, 5:15 PM  [facility] [address] [place], [place], [address] Phone: [phone]   Fax:  [phone]  Name: Jocelyn Kaufman MRN: [id number] Date of Birth: 02-Sep-1956

## 2018-09-19 ENCOUNTER — Ambulatory Visit (INDEPENDENT_AMBULATORY_CARE_PROVIDER_SITE_OTHER): Payer: BC Managed Care – PPO

## 2018-09-19 ENCOUNTER — Ambulatory Visit: Payer: BC Managed Care – PPO | Admitting: Occupational Therapy

## 2018-09-19 DIAGNOSIS — J309 Allergic rhinitis, unspecified: Secondary | ICD-10-CM

## 2018-09-19 DIAGNOSIS — M25611 Stiffness of right shoulder, not elsewhere classified: Secondary | ICD-10-CM

## 2018-09-19 DIAGNOSIS — M79621 Pain in right upper arm: Secondary | ICD-10-CM

## 2018-09-19 DIAGNOSIS — I69815 Cognitive social or emotional deficit following other cerebrovascular disease: Secondary | ICD-10-CM

## 2018-09-19 DIAGNOSIS — M6281 Muscle weakness (generalized): Secondary | ICD-10-CM

## 2018-09-19 NOTE — Therapy (Signed)
[facility] [address] [place], [place], [address] Phone: [phone]   Fax:  [phone]  Occupational Therapy Treatment  Patient Details  Name: Jocelyn Kaufman MRN: [id number] Date of Birth: Dec 05, 1955 Referring Provider (OT): Deland Pretty   Encounter Date: 09/19/2018  OT End of Session - 09/19/18 0809    Visit Number  15    Number of Visits  21    Date for OT Re-Evaluation  09/28/18    Authorization Type  BCBS, [place]    OT Start Time  0805    OT Stop Time  0845    OT Time Calculation (min)  40 min    Activity Tolerance  Patient tolerated treatment well    Behavior During Therapy  [facility] for tasks assessed/performed       Past Medical History:  Diagnosis Date  . Adrenal adenoma   . 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 Depression   . Diabetes mellitus without complication ([place])   . Hyperlipidemia   . Hypertension   . Hypothyroidism   . Meralgia paresthetica of right side 01/10/2017  . Migraine   . Pericardial effusion   . 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 APPENDECTOMY    . CYST REMOVAL LEG Right    Removed from right thigh joint  . ORIF HUMERUS FRACTURE Right 07/09/2018   Procedure: OPEN REDUCTION INTERNAL FIXATION (ORIF) HUMERAL SHAFT FRACTURE;  Surgeon: Hiram Gash, MD;  Location: [facility];  Service: Orthopedics;  Laterality: Right;  . TONSILLECTOMY    . TOTAL VAGINAL HYSTERECTOMY    . UMBILICAL HERNIA REPAIR      There were no vitals filed for this visit.  Subjective Assessment - 09/19/18 0808    Limitations  Pt is now cleared for ROM as tolerated, and gentle strengthening as tolerated    Currently in Pain?  No/denies           Treatment:supine closed chain shoulder flexion, chest press, followed by  shoulder flexion with  with a 1 lbs weight, in RUE then shoulder circles each direction min v.c   [place] 1 mhz, 0.8 w./cm 2, 20% x 8 mins  To RUE  incision site, upper arm for scar mobilizations, no adverse reactions Wall slides x 10 reps min v.c, shoulder abduction closed chain with cane, min v.c 10-20 reps each exercise Weightbearing through tabletop with bilateral UE's, while rocking forwards and backwards, x 10 reps  Arm bike x 5 mins level 3 for conditioning.                      OT Short Term Goals - 09/02/18 1527      OT SHORT TERM GOAL #1   Title  Patient will complete an HEP designed to increase elbow through hand AROM DUE 08/29/2018    Status  Achieved      OT SHORT TERM GOAL #2   Title  Patient will demonstrate knowledge and effective technique for scar mobilization and management    Status  Achieved      OT SHORT TERM GOAL #3   Title  Patient will demonstrate understanding of safe transition out of sling schedule    Status  Achieved      OT SHORT TERM GOAL #4   Title  Patient will complete HEP designed for self ROM for right shoulder    Status  Achieved  OT SHORT TERM GOAL #5   Title  Patient will incorporate right hand into daily functional tasks as able, incorporating elbow, flroearm, wrist and hand motion, e.g. writing, feeding, oral hygiene, aspects of bathing, dresisng    Status  Achieved        OT Long Term Goals - 08/20/18 1540      OT [place] #1   Title  Patient will complete update HEP designed to improve AROM RUE DUE 09/28/18    Status  On-going      OT LONG TERM GOAL #2   Title  Patient will demonstrate adequate shoulder range of motion and strength to obtain / place a lightweight (LESS THAN 2LB) object on chest height shelf with RUE    Status  On-going      OT LONG TERM GOAL #3   Title  Patient will demonstrate adequate strength and range of motion to use a curling iron for hair care - one hand for comb, one hand for iron    Status  On-going      OT LONG TERM GOAL #4   Title  Patient will demonstrate adequate strength and range of motion to apply and remove seat  belt, includes buckle (driver's side or passenger side) without report of increased pain    Status  On-going      OT LONG TERM GOAL #5   Title  Patient will demonstrate adequate range of motion and consistent pressure to use BUE's to guide material through sewing machine    Status  On-going            Plan - 09/19/18 0909    Clinical Impression Statement  Pt is progressing towards goals. She demonstrates improving strength and ROM.    Occupational Profile and client history currently impacting functional performance  retired Pharmacist, hospital, Psychologist, occupational, enjoys chair yoga, Radiographer, therapeutic    Occupational performance deficits (Please refer to evaluation for details):  ADL's;IADL's;Rest and Sleep;Leisure;Social Participation    Rehab Potential  Good    Current Impairments/barriers affecting progress:  pain, fear, need to establish trust     OT Frequency  2x / week    OT Duration  8 weeks    OT Treatment/Interventions  Self-care/ADL training;Therapeutic exercise;Functional Mobility Training;Balance training;Aquatic Therapy;Ultrasound;Neuromuscular education;Manual Therapy;Splinting;Energy conservation;Therapeutic activities;Coping strategies training;DME and/or AE instruction;Cognitive remediation/compensation;Electrical Stimulation;Scar mobilization;Moist Heat;Cryotherapy;Passive range of motion;Patient/family education    Plan  continue ROM, and light strengthening, light weight bearing -     Consulted and Agree with Plan of Care  Patient       Patient will benefit from skilled therapeutic intervention in order to improve the following deficits and impairments:  Decreased endurance, Decreased skin integrity, Increased muscle spasms, Decreased coping skills, Decreased knowledge of precautions, Decreased scar mobility, Improper body mechanics, Decreased activity tolerance, Decreased knowledge of use of DME, Decreased strength, Impaired flexibility, Decreased balance, Decreased mobility,  Difficulty walking, Decreased cognition, Decreased range of motion, Increased edema, Pain, Decreased coordination, Decreased safety awareness, Increased fascial restrictions, Impaired UE functional use  Visit Diagnosis: Muscle weakness (generalized)  Stiffness of right shoulder, not elsewhere classified  Pain in right upper arm  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 Fall at home, initial encounter 07/06/2018  . Displaced spiral fracture of shaft of humerus, right arm, initial encounter for closed fracture 07/06/2018  . TIA (transient ischemic attack) 07/06/2018  . Allergic rhinitis 07/04/2018  .  Normochromic normocytic anemia 01/20/2018  . Rheumatoid arthritis ([place]) 04/30/2017  . Lesion of left humerus 04/29/2017  . Pericardial effusion 03/14/2017  . Diabetes mellitus ([place]) 03/14/2017  . Essential hypertension 03/14/2017  . Mixed hyperlipidemia 03/14/2017  . 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 Meralgia paresthetica of right side 01/10/2017  . Allergic rhinoconjunctivitis 07/09/2015  . Asthma, well controlled 07/09/2015  . Laryngopharyngeal reflux (LPR) 07/09/2015    Markitta Ausburn 09/19/2018, 9:12 AM  [facility] [address] [place], [place], [address] Phone: [phone]   Fax:  [phone]  Name: SWATI GRANBERRY MRN: [id number] Date of Birth: [birth year]

## 2018-09-22 ENCOUNTER — Ambulatory Visit: Payer: BC Managed Care – PPO | Admitting: Physical Therapy

## 2018-09-22 ENCOUNTER — Ambulatory Visit: Payer: BC Managed Care – PPO | Admitting: Occupational Therapy

## 2018-09-22 DIAGNOSIS — R2681 Unsteadiness on feet: Secondary | ICD-10-CM

## 2018-09-22 DIAGNOSIS — M25611 Stiffness of right shoulder, not elsewhere classified: Secondary | ICD-10-CM | POA: Diagnosis not present

## 2018-09-22 DIAGNOSIS — I69815 Cognitive social or emotional deficit following other cerebrovascular disease: Secondary | ICD-10-CM

## 2018-09-22 DIAGNOSIS — M6281 Muscle weakness (generalized): Secondary | ICD-10-CM

## 2018-09-22 DIAGNOSIS — M79621 Pain in right upper arm: Secondary | ICD-10-CM

## 2018-09-22 NOTE — Therapy (Signed)
[facility] [address] [place], [place], [address] Phone: [phone]   Fax:  [phone]  Occupational Therapy Treatment  Patient Details  Name: Jocelyn Kaufman MRN: [id number] Date of Birth: 1956-06-16 Referring Provider (OT): Deland Pretty   Encounter Date: 09/22/2018  OT End of Session - 09/22/18 1513    Visit Number  16    Number of Visits  21    Date for OT Re-Evaluation  09/28/18    Authorization Type  BCBS, VL=[place]    OT Start Time  1317    OT Stop Time  1400    OT Time Calculation (min)  43 min    Activity Tolerance  Patient tolerated treatment well    Behavior During Therapy  [facility] for tasks assessed/performed       Past Medical History:  Diagnosis Date  . Adrenal adenoma   . 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 Depression   . Diabetes mellitus without complication ([place])   . Hyperlipidemia   . Hypertension   . Hypothyroidism   . Meralgia paresthetica of right side 01/10/2017  . Migraine   . Pericardial effusion   . 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 APPENDECTOMY    . CYST REMOVAL LEG Right    Removed from right thigh joint  . ORIF HUMERUS FRACTURE Right 07/09/2018   Procedure: OPEN REDUCTION INTERNAL FIXATION (ORIF) HUMERAL SHAFT FRACTURE;  Surgeon: Hiram Gash, MD;  Location: [facility];  Service: Orthopedics;  Laterality: Right;  . TONSILLECTOMY    . TOTAL VAGINAL HYSTERECTOMY    . UMBILICAL HERNIA REPAIR      There were no vitals filed for this visit.  Subjective Assessment - 09/22/18 1517    Limitations  Pt is now cleared for ROM as tolerated, and gentle strengthening as tolerated    Currently in Pain?  Yes    Pain Score  3     Pain Location  Shoulder    Pain Orientation  Right    Pain Descriptors / Indicators  Aching    Pain Type  Acute pain    Pain Onset  More than a month ago    Pain Frequency  Intermittent    Aggravating Factors    certain movements    Pain Relieving Factors  rest    Multiple Pain Sites  No                Treatment: supine closed chain shoulder flexion, and abduction, followed by yellow theraband exercises for shoulder abduction, shoulder extension, shoulder flexion, biceps curls 5-10 reps each Functional overhead reaching to place graded clothespins on vertical antenna with RUE for sustained pinch, min difficulty/ v.c Hotpack applied x 61mins end of session, no adverse reactions              OT Short Term Goals - 09/02/18 1527      OT SHORT TERM GOAL #1   Title  Patient will complete an HEP designed to increase elbow through hand AROM DUE 08/29/2018    Status  Achieved      OT SHORT TERM GOAL #2   Title  Patient will demonstrate knowledge and effective technique for scar mobilization and management    Status  Achieved      OT SHORT TERM GOAL #3   Title  Patient will demonstrate understanding of safe transition out of sling schedule    Status  Achieved      OT SHORT TERM GOAL #4   Title  Patient will complete HEP designed for self ROM for right shoulder    Status  Achieved      OT SHORT TERM GOAL #5   Title  Patient will incorporate right hand into daily functional tasks as able, incorporating elbow, flroearm, wrist and hand motion, e.g. writing, feeding, oral hygiene, aspects of bathing, dresisng    Status  Achieved        OT Long Term Goals - 08/20/18 1540      OT LONG TERM GOAL #1   Title  Patient will complete update HEP designed to improve AROM RUE DUE 09/28/18    Status  On-going      OT LONG TERM GOAL #2   Title  Patient will demonstrate adequate shoulder range of motion and strength to obtain / place a lightweight (LESS THAN 2LB) object on chest height shelf with RUE    Status  On-going      OT LONG TERM GOAL #3   Title  Patient will demonstrate adequate strength and range of motion to use a curling iron for hair care - one hand for comb, one hand for  iron    Status  On-going      OT LONG TERM GOAL #4   Title  Patient will demonstrate adequate strength and range of motion to apply and remove seat belt, includes buckle (driver's side or passenger side) without report of increased pain    Status  On-going      OT LONG TERM GOAL #5   Title  Patient will demonstrate adequate range of motion and consistent pressure to use BUE's to guide material through sewing machine    Status  On-going            Plan - 09/22/18 1515    Clinical Impression Statement  Pt is progressing towards goals. She demonstrates improving RUE strength.    Occupational Profile and client history currently impacting functional performance  retired Pharmacist, hospital, Psychologist, occupational, enjoys chair yoga, Radiographer, therapeutic    Occupational performance deficits (Please refer to evaluation for details):  ADL's;IADL's;Rest and Sleep;Leisure;Social Participation    Rehab Potential  Good    Current Impairments/barriers affecting progress:  pain, fear, need to establish trust     OT Frequency  2x / week    OT Duration  8 weeks    OT Treatment/Interventions  Self-care/ADL training;Therapeutic exercise;Functional Mobility Training;Balance training;Aquatic Therapy;Ultrasound;Neuromuscular education;Manual Therapy;Splinting;Energy conservation;Therapeutic activities;Coping strategies training;DME and/or AE instruction;Cognitive remediation/compensation;Electrical Stimulation;Scar mobilization;Moist Heat;Cryotherapy;Passive range of motion;Patient/family education    Plan  continue ROM, and strengthening    Consulted and Agree with Plan of Care  Patient       Patient will benefit from skilled therapeutic intervention in order to improve the following deficits and impairments:  Decreased endurance, Decreased skin integrity, Increased muscle spasms, Decreased coping skills, Decreased knowledge of precautions, Decreased scar mobility, Improper body mechanics, Decreased activity tolerance,  Decreased knowledge of use of DME, Decreased strength, Impaired flexibility, Decreased balance, Decreased mobility, Difficulty walking, Decreased cognition, Decreased range of motion, Increased edema, Pain, Decreased coordination, Decreased safety awareness, Increased fascial restrictions, Impaired UE functional use  Visit Diagnosis: Muscle weakness (generalized)  Stiffness of right shoulder, not elsewhere classified  Pain in right upper arm  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 Fall  at home, initial encounter 07/06/2018  . Displaced spiral fracture of shaft of humerus, right arm, initial encounter for closed fracture 07/06/2018  . TIA (transient ischemic attack) 07/06/2018  . Allergic rhinitis 07/04/2018  . Normochromic normocytic anemia 01/20/2018  . Rheumatoid arthritis ([place]) 04/30/2017  . Lesion of left humerus 04/29/2017  . Pericardial effusion 03/14/2017  . Diabetes mellitus ([place]) 03/14/2017  . Essential hypertension 03/14/2017  . Mixed hyperlipidemia 03/14/2017  . 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 Meralgia paresthetica of right side 01/10/2017  . Allergic rhinoconjunctivitis 07/09/2015  . Asthma, well controlled 07/09/2015  . Laryngopharyngeal reflux (LPR) 07/09/2015    [place] 09/22/2018, 3:18 PM  [facility] [address] [place], [place], [address] Phone: [phone]   Fax:  [phone]  Name: Jocelyn Kaufman MRN: [id number] Date of Birth: [birth year]

## 2018-09-23 ENCOUNTER — Encounter

## 2018-09-23 ENCOUNTER — Telehealth: Payer: Self-pay | Admitting: Occupational Therapy

## 2018-09-23 ENCOUNTER — Ambulatory Visit: Payer: BC Managed Care – PPO | Admitting: Occupational Therapy

## 2018-09-23 ENCOUNTER — Ambulatory Visit: Payer: BC Managed Care – PPO | Admitting: Physical Therapy

## 2018-09-23 ENCOUNTER — Institutional Professional Consult (permissible substitution): Payer: BC Managed Care – PPO | Admitting: Neurology

## 2018-09-23 DIAGNOSIS — M25611 Stiffness of right shoulder, not elsewhere classified: Secondary | ICD-10-CM | POA: Diagnosis not present

## 2018-09-23 DIAGNOSIS — M6281 Muscle weakness (generalized): Secondary | ICD-10-CM

## 2018-09-23 DIAGNOSIS — I69815 Cognitive social or emotional deficit following other cerebrovascular disease: Secondary | ICD-10-CM

## 2018-09-23 DIAGNOSIS — M79621 Pain in right upper arm: Secondary | ICD-10-CM

## 2018-09-23 NOTE — Therapy (Signed)
[facility] [address] [place], [place], [address] Phone: [phone]   Fax:  [phone]  Occupational Therapy Treatment  Patient Details  Name: Jocelyn Kaufman MRN: [id number] Date of Birth: November 12, 1955 Referring Provider (OT): Deland Pretty   Encounter Date: 09/23/2018  OT End of Session - 09/23/18 1732    Visit Number  17    Number of Visits  21    Date for OT Re-Evaluation  09/28/18    Authorization Type  BCBS, VL=[place]    OT Start Time  1320    OT Stop Time  1400    OT Time Calculation (min)  40 min    Activity Tolerance  Patient tolerated treatment well    Behavior During Therapy  [facility] for tasks assessed/performed       Past Medical History:  Diagnosis Date  . Adrenal adenoma   . 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 Depression   . Diabetes mellitus without complication ([place])   . Hyperlipidemia   . Hypertension   . Hypothyroidism   . Meralgia paresthetica of right side 01/10/2017  . Migraine   . Pericardial effusion   . 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 APPENDECTOMY    . CYST REMOVAL LEG Right    Removed from right thigh joint  . ORIF HUMERUS FRACTURE Right 07/09/2018   Procedure: OPEN REDUCTION INTERNAL FIXATION (ORIF) HUMERAL SHAFT FRACTURE;  Surgeon: Hiram Gash, MD;  Location: [facility];  Service: Orthopedics;  Laterality: Right;  . TONSILLECTOMY    . TOTAL VAGINAL HYSTERECTOMY    . UMBILICAL HERNIA REPAIR      There were no vitals filed for this visit.  Subjective Assessment - 09/23/18 1730    Subjective   Pt reports nagging shoulder pain    Limitations  Pt is now cleared for ROM as tolerated, and gentle strengthening as tolerated    Currently in Pain?  Yes    Pain Score  3     Pain Location  Shoulder    Pain Orientation  Right    Pain Descriptors / Indicators  Aching    Pain Type  Acute pain    Pain Onset  More than a month ago    Pain  Frequency  Intermittent    Aggravating Factors   certain movements    Pain Relieving Factors  rest            Treatment:supine closed chain shoulder flexion, chest press, followed by  shoulder flexion with  with a 1 lbs weight, in RUE then shoulder circles each direction min v.c   [place] 3 mhz, 0.8 w./cm 2, 20% x 8 mins  To RUE incision site, upper arm for scar mobilizations, no adverse reactions Functional mid-high range reaching to place large pegs into pegboard with RUE, improved performance, min v.c to avoid compensation, several rest breaks required. Hotpack applied to pt's right shoulder at end of session x 10 mins, no adverse reactions               OT Short Term Goals - 09/02/18 1527      OT SHORT TERM GOAL #1   Title  Patient will complete an HEP designed to increase elbow through hand AROM DUE 08/29/2018    Status  Achieved      OT SHORT TERM GOAL #2   Title  Patient will demonstrate knowledge and effective technique for scar  mobilization and management    Status  Achieved      OT SHORT TERM GOAL #3   Title  Patient will demonstrate understanding of safe transition out of sling schedule    Status  Achieved      OT SHORT TERM GOAL #4   Title  Patient will complete HEP designed for self ROM for right shoulder    Status  Achieved      OT SHORT TERM GOAL #5   Title  Patient will incorporate right hand into daily functional tasks as able, incorporating elbow, flroearm, wrist and hand motion, e.g. writing, feeding, oral hygiene, aspects of bathing, dresisng    Status  Achieved        OT Long Term Goals - 08/20/18 1540      OT LONG TERM GOAL #1   Title  Patient will complete update HEP designed to improve AROM RUE DUE 09/28/18    Status  On-going      OT LONG TERM GOAL #2   Title  Patient will demonstrate adequate shoulder range of motion and strength to obtain / place a lightweight (LESS THAN 2LB) object on chest height shelf with RUE    Status  On-going       OT LONG TERM GOAL #3   Title  Patient will demonstrate adequate strength and range of motion to use a curling iron for hair care - one hand for comb, one hand for iron    Status  On-going      OT LONG TERM GOAL #4   Title  Patient will demonstrate adequate strength and range of motion to apply and remove seat belt, includes buckle (driver's side or passenger side) without report of increased pain    Status  On-going      OT LONG TERM GOAL #5   Title  Patient will demonstrate adequate range of motion and consistent pressure to use BUE's to guide material through sewing machine    Status  On-going            Plan - 09/23/18 1728    Clinical Impression Statement  Pt is progressing towards goals. Pt reports discomfort at her incision site.    Occupational Profile and client history currently impacting functional performance  retired Pharmacist, hospital, Psychologist, occupational, enjoys chair yoga, Radiographer, therapeutic    Occupational performance deficits (Please refer to evaluation for details):  ADL's;IADL's;Rest and Sleep;Leisure;Social Participation    Rehab Potential  Good    Current Impairments/barriers affecting progress:  pain, fear, need to establish trust     OT Frequency  2x / week    OT Duration  8 weeks    OT Treatment/Interventions  Self-care/ADL training;Therapeutic exercise;Functional Mobility Training;Balance training;Aquatic Therapy;Ultrasound;Neuromuscular education;Manual Therapy;Splinting;Energy conservation;Therapeutic activities;Coping strategies training;DME and/or AE instruction;Cognitive remediation/compensation;Electrical Stimulation;Scar mobilization;Moist Heat;Cryotherapy;Passive range of motion;Patient/family education    Plan  continue ROM, and strengthening    Consulted and Agree with Plan of Care  Patient       Patient will benefit from skilled therapeutic intervention in order to improve the following deficits and impairments:  Decreased endurance, Decreased skin integrity,  Increased muscle spasms, Decreased coping skills, Decreased knowledge of precautions, Decreased scar mobility, Improper body mechanics, Decreased activity tolerance, Decreased knowledge of use of DME, Decreased strength, Impaired flexibility, Decreased balance, Decreased mobility, Difficulty walking, Decreased cognition, Decreased range of motion, Increased edema, Pain, Decreased coordination, Decreased safety awareness, Increased fascial restrictions, Impaired UE functional use  Visit Diagnosis: No diagnosis found.    Problem  List Patient Active Problem List   Diagnosis Date Noted  . Fall at home, initial encounter 07/06/2018  . Displaced spiral fracture of shaft of humerus, right arm, initial encounter for closed fracture 07/06/2018  . TIA (transient ischemic attack) 07/06/2018  . Allergic rhinitis 07/04/2018  . Normochromic normocytic anemia 01/20/2018  . Rheumatoid arthritis ([place]) 04/30/2017  . Lesion of left humerus 04/29/2017  . Pericardial effusion 03/14/2017  . Diabetes mellitus ([place]) 03/14/2017  . Essential hypertension 03/14/2017  . Mixed hyperlipidemia 03/14/2017  . 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 Meralgia paresthetica of right side 01/10/2017  . Allergic rhinoconjunctivitis 07/09/2015  . Asthma, well controlled 07/09/2015  . Laryngopharyngeal reflux (LPR) 07/09/2015    , 09/23/2018, 5:33 PM  [facility] [address] [place], [place], [address] Phone: [phone]   Fax:  [phone]  Name: Jocelyn Kaufman MRN: [id number] Date of Birth: 10/21/1956

## 2018-09-30 ENCOUNTER — Ambulatory Visit: Payer: BC Managed Care – PPO | Admitting: Physical Therapy

## 2018-09-30 ENCOUNTER — Ambulatory Visit: Payer: BC Managed Care – PPO | Attending: Internal Medicine | Admitting: Occupational Therapy

## 2018-09-30 DIAGNOSIS — M79621 Pain in right upper arm: Secondary | ICD-10-CM | POA: Diagnosis present

## 2018-09-30 DIAGNOSIS — M25611 Stiffness of right shoulder, not elsewhere classified: Secondary | ICD-10-CM

## 2018-09-30 DIAGNOSIS — I69815 Cognitive social or emotional deficit following other cerebrovascular disease: Secondary | ICD-10-CM | POA: Diagnosis present

## 2018-09-30 DIAGNOSIS — M6281 Muscle weakness (generalized): Secondary | ICD-10-CM | POA: Diagnosis not present

## 2018-09-30 NOTE — Patient Instructions (Signed)
SHOULDER: Flexion Unilateral    Raise one arm overhead. Keep thumb pointed up.hold 1 lbs weight, move in low range in seated or standing   10___ reps per set, __1-2_ sets per day, ___ days per week  SHOULDER: Abduction Unilateral    Raise arm out and up palm up. Keep elbow straight. Do not shrug shoulders. 10___ reps per set, __2_ sets per day, __5_ days per week  Copyright  VHI. All rights reserved.   Copyright  VHI. All rights reserved.  AROM: Elbow Flexion / Extension    Gently bend elbow as far as possible. Then straighten arm as far as possible. Repeat _10-15___ times per set. Do _2___ sessions per day. Hold 2 lbs weight

## 2018-10-01 NOTE — Therapy (Signed)
[facility] [address] [place], [place], [address] Phone: [phone]   Fax:  [phone]  Occupational Therapy Treatment  Patient Details  Name: Jocelyn Kaufman MRN: [id number] Date of Birth: 1956-01-30 Referring Provider (OT): Deland Pretty   Encounter Date: 09/30/2018  OT End of Session - 09/30/18 1119    Visit Number  18    Number of Visits  28    Date for OT Re-Evaluation  11/07/18    Authorization Type  BCBS, [place]    Authorization Time Period  renewed 09/30/18 for 2x week x 5 weeks or 10 visits total    OT Start Time  1105    OT Stop Time  1155   3 units only, hotpack end of session   OT Time Calculation (min)  50 min       Past Medical History:  Diagnosis Date  . Adrenal adenoma   . 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 Depression   . Diabetes mellitus without complication ([place])   . Hyperlipidemia   . Hypertension   . Hypothyroidism   . Meralgia paresthetica of right side 01/10/2017  . Migraine   . Pericardial effusion   . 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 APPENDECTOMY    . CYST REMOVAL LEG Right    Removed from right thigh joint  . ORIF HUMERUS FRACTURE Right 07/09/2018   Procedure: OPEN REDUCTION INTERNAL FIXATION (ORIF) HUMERAL SHAFT FRACTURE;  Surgeon: Hiram Gash, MD;  Location: [facility];  Service: Orthopedics;  Laterality: Right;  . TONSILLECTOMY    . TOTAL VAGINAL HYSTERECTOMY    . UMBILICAL HERNIA REPAIR      There were no vitals filed for this visit.  Subjective Assessment - 09/30/18 1108    Limitations  Pt is now cleared for ROM as tolerated, and gentle strengthening as tolerated    Currently in Pain?  Yes    Pain Score  2     Pain Location  Shoulder    Pain Orientation  Right    Pain Descriptors / Indicators  Aching    Pain Type  Surgical pain    Pain Onset  More than a month ago    Pain Frequency  Intermittent    Aggravating  Factors   certain movements    Pain Relieving Factors  rest    Multiple Pain Sites  No         [facility] OT Assessment - 10/01/18 0001      ROM / Strength   AROM / PROM / Strength  AROM          Treatment: supine closed chain shoulder flexion, chest press, followed by shoulder flexion with with a 1 lbs weight, chest press with 2 lbs weight, in RUE then shoulder circles each direction min v.c   Wall slides x 10 reps, Arm bike x 5 mins level 3 for conditioning/ reciprocal movement Hotpack applied to pt's right shoulder and hand at end of session x 10 mins, no adverse reactions             OT Education - 10/01/18 0930    Education Details  strengthening HEP, 10 reps each, min v.c- see pt instructions    Person(s) Educated  Patient    Methods  Handout;Explanation       OT Short Term Goals - 09/30/18 1135      OT SHORT TERM GOAL #1  Title  Patient will complete an HEP designed to increase elbow through hand AROM DUE 08/29/2018    Status  Achieved      OT SHORT TERM GOAL #2   Title  Patient will demonstrate knowledge and effective technique for scar mobilization and management    Status  Achieved      OT SHORT TERM GOAL #3   Title  Patient will demonstrate understanding of safe transition out of sling schedule    Status  Achieved      OT SHORT TERM GOAL #4   Title  Patient will complete HEP designed for self ROM for right shoulder    Status  Achieved      OT SHORT TERM GOAL #5   Title  Patient will incorporate right hand into daily functional tasks as able, incorporating elbow, flroearm, wrist and hand motion, e.g. writing, feeding, oral hygiene, aspects of bathing, dresisng    Status  Achieved        OT Long Term Goals - 09/30/18 1122      OT LONG TERM GOAL #1   Title  Patient will complete update HEP designed to improve AROM RUE    Status  Achieved      OT LONG TERM GOAL #2   Title  Patient will demonstrate adequate shoulder range of motion and  strength to obtain / place a lightweight (LESS THAN 2LB) object on chest height shelf with RUE    Status  Achieved      OT LONG TERM GOAL #3   Title  Patient will demonstrate adequate strength and range of motion to use a curling iron for hair care - one hand for comb, one hand for iron- updated goals due 11/07/17    Time  5    Period  Weeks    Status  On-going   pt reports she does not have adequate endurance yet   Target Date  11/07/18      OT LONG TERM GOAL #4   Title  Patient will demonstrate adequate strength and range of motion to apply and remove seat belt, includes buckle (driver's side or passenger side) without report of increased pain    Status  Achieved      OT LONG TERM GOAL #5   Title  Patient will demonstrate adequate range of motion and consistent pressure to use BUE's to guide material through sewing machine    Time  5    Period  Weeks    Status  On-going   does not feel as though arm is strong enough     Long Term Additional Goals   Additional Long Term Goals  Yes      OT LONG TERM GOAL #6   Title  Pt will demonstrate adequate strength and control to retrieve a 1 lbs weight at 110* shoulder flexion.    Time  5    Period  Weeks    Status  On-going      OT LONG TERM GOAL #7   Title  I with updated HEP.    Time  5    Period  Weeks    Status  New            Plan - 10/01/18 0910    Clinical Impression Statement  Pt demonstrates good overall progress with improved ROM, decreased pain and increased RUE functional use. Pt has not fully achieved goals for functional overhead reach. Pt can benefit from continued skilled occupational therapy to maximize  pt's RUE strength and ROM for increased independence with ADLs/IADLs.    Occupational Profile and client history currently impacting functional performance  retired Pharmacist, hospital, Psychologist, occupational, enjoys chair yoga, Radiographer, therapeutic    Occupational performance deficits (Please refer to evaluation for details):   ADL's;IADL's;Rest and Sleep;Leisure;Social Participation    Rehab Potential  Good    Current Impairments/barriers affecting progress:  pain, fear, need to establish trust     OT Frequency  2x / week    OT Duration  --   5 weeks or (10 visits total over 8 weeks)   OT Treatment/Interventions  Self-care/ADL training;Therapeutic exercise;Functional Mobility Training;Balance training;Aquatic Therapy;Ultrasound;Neuromuscular education;Manual Therapy;Splinting;Energy conservation;Therapeutic activities;Coping strategies training;DME and/or AE instruction;Cognitive remediation/compensation;Electrical Stimulation;Scar mobilization;Moist Heat;Cryotherapy;Passive range of motion;Patient/family education    Plan  continue ROM, and strengthening, work towards unmet and updated goals    Consulted and Agree with Plan of Care  Patient       Patient will benefit from skilled therapeutic intervention in order to improve the following deficits and impairments:  Decreased endurance, Decreased skin integrity, Increased muscle spasms, Decreased coping skills, Decreased knowledge of precautions, Decreased scar mobility, Improper body mechanics, Decreased activity tolerance, Decreased knowledge of use of DME, Decreased strength, Impaired flexibility, Decreased balance, Decreased mobility, Difficulty walking, Decreased cognition, Decreased range of motion, Increased edema, Pain, Decreased coordination, Decreased safety awareness, Increased fascial restrictions, Impaired UE functional use  Visit Diagnosis: Muscle weakness (generalized) - Plan: Ot plan of care cert/re-cert  Stiffness of right shoulder, not elsewhere classified - Plan: Ot plan of care cert/re-cert  Pain in right upper arm - Plan: Ot plan of care cert/re-cert  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 Fall at  home, initial encounter 07/06/2018  . Displaced spiral fracture of shaft of humerus, right arm, initial encounter for closed fracture 07/06/2018  . TIA (transient ischemic attack) 07/06/2018  . Allergic rhinitis 07/04/2018  . Normochromic normocytic anemia 01/20/2018  . Rheumatoid arthritis ([facility]) 04/30/2017  . Lesion of left humerus 04/29/2017  . Pericardial effusion 03/14/2017  . Diabetes mellitus ([facility]) 03/14/2017  . Essential hypertension 03/14/2017  . Mixed hyperlipidemia 03/14/2017  . 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 Meralgia paresthetica of right side 01/10/2017  . Allergic rhinoconjunctivitis 07/09/2015  . Asthma, well controlled 07/09/2015  . Laryngopharyngeal reflux (LPR) 07/09/2015    RINE,KATHRYN 10/01/2018, 1:09 PM  [facility] [address] [place], [place], [address] Phone: [phone]   Fax:  [phone]  Name: GRISELLE RUFER MRN: [id number] Date of Birth: 1955/11/25

## 2018-10-02 ENCOUNTER — Ambulatory Visit: Payer: BC Managed Care – PPO | Admitting: Occupational Therapy

## 2018-10-02 ENCOUNTER — Ambulatory Visit: Payer: BC Managed Care – PPO | Admitting: Rehabilitative and Restorative Service Providers"

## 2018-10-02 DIAGNOSIS — M6281 Muscle weakness (generalized): Secondary | ICD-10-CM

## 2018-10-02 DIAGNOSIS — I69815 Cognitive social or emotional deficit following other cerebrovascular disease: Secondary | ICD-10-CM

## 2018-10-02 DIAGNOSIS — M25611 Stiffness of right shoulder, not elsewhere classified: Secondary | ICD-10-CM

## 2018-10-02 DIAGNOSIS — M79621 Pain in right upper arm: Secondary | ICD-10-CM

## 2018-10-02 NOTE — Therapy (Signed)
[facility] [address] [place], [place], [address] Phone: [phone]   Fax:  [phone]  Occupational Therapy Treatment  Patient Details  Name: Jocelyn Kaufman MRN: [id number] Date of Birth: [birth year] Referring Provider (OT): Deland Pretty   Encounter Date: 10/02/2018  OT End of Session - 10/02/18 1724    Visit Number  19    Number of Visits  28    Date for OT Re-Evaluation  11/07/18    Authorization Type  BCBS, [place]    Authorization Time Period  renewed 09/30/18 for 2x week x 5 weeks or 10 visits total    OT Start Time  8527   pt was moved to later slot due to therapist emergency   OT Stop Time  1325    OT Time Calculation (min)  50 min       Past Medical History:  Diagnosis Date  . Adrenal adenoma   . 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 Depression   . Diabetes mellitus without complication ([facility])   . Hyperlipidemia   . Hypertension   . Hypothyroidism   . Meralgia paresthetica of right side 01/10/2017  . Migraine   . Pericardial effusion   . 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 APPENDECTOMY    . CYST REMOVAL LEG Right    Removed from right thigh joint  . ORIF HUMERUS FRACTURE Right 07/09/2018   Procedure: OPEN REDUCTION INTERNAL FIXATION (ORIF) HUMERAL SHAFT FRACTURE;  Surgeon: Hiram Gash, MD;  Location: [facility];  Service: Orthopedics;  Laterality: Right;  . TONSILLECTOMY    . TOTAL VAGINAL HYSTERECTOMY    . UMBILICAL HERNIA REPAIR      There were no vitals filed for this visit.  Subjective Assessment - 10/02/18 1723    Currently in Pain?  Yes    Pain Score  2     Pain Location  Shoulder    Pain Orientation  Right    Pain Descriptors / Indicators  Aching    Pain Type  Surgical pain    Pain Onset  More than a month ago    Pain Frequency  Intermittent    Aggravating Factors   malpositioning    Pain Relieving Factors  rest                  Treatment: supine closed chain shoulder flexion, chest press, followed by shoulder flexion with with a 1 lbs weight, chest press with 2 lbs weight, in RUE then shoulder circles each direction min v.c   Wall slides x 10 reps, Standing unilateral shoulder flexion, extension and abduction, rows with 1 lbs weight x 10 reps, then biceps curls x 10 reps with 2 lbs weight Note sent to MD including the following ROM measurements: Shoulder flexion 110 with compensation, 90 without compensation, shoulder abduction 95, shoulder ext 55 Arm bike x 5 mins level 3 for conditioning/ reciprocal movement Hotpack applied to pt's right shoulder and hand at end of session x 10 mins, no adverse reactions            OT Short Term Goals - 09/30/18 1135      OT SHORT TERM GOAL #1   Title  Patient will complete an HEP designed to increase elbow through hand AROM DUE 08/29/2018    Status  Achieved      OT SHORT TERM GOAL #2   Title  Patient will demonstrate  knowledge and effective technique for scar mobilization and management    Status  Achieved      OT SHORT TERM GOAL #3   Title  Patient will demonstrate understanding of safe transition out of sling schedule    Status  Achieved      OT SHORT TERM GOAL #4   Title  Patient will complete HEP designed for self ROM for right shoulder    Status  Achieved      OT SHORT TERM GOAL #5   Title  Patient will incorporate right hand into daily functional tasks as able, incorporating elbow, flroearm, wrist and hand motion, e.g. writing, feeding, oral hygiene, aspects of bathing, dresisng    Status  Achieved        OT Long Term Goals - 09/30/18 1122      OT LONG TERM GOAL #1   Title  Patient will complete update HEP designed to improve AROM RUE    Status  Achieved      OT LONG TERM GOAL #2   Title  Patient will demonstrate adequate shoulder range of motion and strength to obtain / place a lightweight (LESS THAN 2LB) object on chest  height shelf with RUE    Status  Achieved      OT LONG TERM GOAL #3   Title  Patient will demonstrate adequate strength and range of motion to use a curling iron for hair care - one hand for comb, one hand for iron- updated goals due 11/07/17    Time  5    Period  Weeks    Status  On-going   pt reports she does not have adequate endurance yet   Target Date  11/07/18      OT LONG TERM GOAL #4   Title  Patient will demonstrate adequate strength and range of motion to apply and remove seat belt, includes buckle (driver's side or passenger side) without report of increased pain    Status  Achieved      OT LONG TERM GOAL #5   Title  Patient will demonstrate adequate range of motion and consistent pressure to use BUE's to guide material through sewing machine    Time  5    Period  Weeks    Status  On-going   does not feel as though arm is strong enough     Long Term Additional Goals   Additional Long Term Goals  Yes      OT LONG TERM GOAL #6   Title  Pt will demonstrate adequate strength and control to retrieve a 1 lbs weight at 110* shoulder flexion.    Time  5    Period  Weeks    Status  On-going      OT LONG TERM GOAL #7   Title  I with updated HEP.    Time  5    Period  Weeks    Status  New            Plan - 10/02/18 1725    Clinical Impression Statement  Pt demonstrates good overall progress with improved ROM, decreased pain and increased RUE functional use. Pt sees ortho MD tomorrow, note sent with pt. including ROM measurements.    Occupational Profile and client history currently impacting functional performance  retired Pharmacist, hospital, Psychologist, occupational, enjoys chair yoga, Radiographer, therapeutic    Occupational performance deficits (Please refer to evaluation for details):  ADL's;IADL's;Rest and Sleep;Leisure;Social Participation    Rehab Potential  Good  Current Impairments/barriers affecting progress:  pain, fear, need to establish trust     OT Frequency  2x / week    OT  Duration  --   5 weeks or 10 visits   OT Treatment/Interventions  Self-care/ADL training;Therapeutic exercise;Functional Mobility Training;Balance training;Aquatic Therapy;Ultrasound;Neuromuscular education;Manual Therapy;Splinting;Energy conservation;Therapeutic activities;Coping strategies training;DME and/or AE instruction;Cognitive remediation/compensation;Electrical Stimulation;Scar mobilization;Moist Heat;Cryotherapy;Passive range of motion;Patient/family education    Plan  continue ROM, and strengthening, work towards unmet and updated goals    Consulted and Agree with Plan of Care  Patient       Patient will benefit from skilled therapeutic intervention in order to improve the following deficits and impairments:  Decreased endurance, Decreased skin integrity, Increased muscle spasms, Decreased coping skills, Decreased knowledge of precautions, Decreased scar mobility, Improper body mechanics, Decreased activity tolerance, Decreased knowledge of use of DME, Decreased strength, Impaired flexibility, Decreased balance, Decreased mobility, Difficulty walking, Decreased cognition, Decreased range of motion, Increased edema, Pain, Decreased coordination, Decreased safety awareness, Increased fascial restrictions, Impaired UE functional use  Visit Diagnosis: Muscle weakness (generalized)  Stiffness of right shoulder, not elsewhere classified  Pain in right upper arm  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 Fall at home, initial encounter 07/06/2018  . Displaced spiral fracture of shaft of humerus, right arm, initial encounter for closed fracture 07/06/2018  . TIA (transient ischemic attack) 07/06/2018  . Allergic rhinitis 07/04/2018  . Normochromic normocytic anemia 01/20/2018  . Rheumatoid arthritis ([facility]) 04/30/2017  . Lesion of left humerus 04/29/2017  . Pericardial effusion  03/14/2017  . Diabetes mellitus ([facility]) 03/14/2017  . Essential hypertension 03/14/2017  . Mixed hyperlipidemia 03/14/2017  . 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 Meralgia paresthetica of right side 01/10/2017  . Allergic rhinoconjunctivitis 07/09/2015  . Asthma, well controlled 07/09/2015  . Laryngopharyngeal reflux (LPR) 07/09/2015    Olyver Hawes 10/02/2018, 5:27 PM  [facility] [address] [place], [place], [address] Phone: [phone]   Fax:  [phone]  Name: Jocelyn Kaufman MRN: [id number] Date of Birth: [birth year]

## 2018-10-06 ENCOUNTER — Telehealth: Payer: Self-pay | Admitting: *Deleted

## 2018-10-06 DIAGNOSIS — I472 Ventricular tachycardia: Secondary | ICD-10-CM

## 2018-10-06 DIAGNOSIS — R0602 Shortness of breath: Secondary | ICD-10-CM

## 2018-10-06 DIAGNOSIS — I4729 Other ventricular tachycardia: Secondary | ICD-10-CM

## 2018-10-06 DIAGNOSIS — R079 Chest pain, unspecified: Secondary | ICD-10-CM

## 2018-10-06 NOTE — Telephone Encounter (Signed)
Received call from [facility]-Dr Bethann Punches, the patient is scheduled for a cardiac CTA and they do not want the patient to have any form of contrast. They are letting us know so incase another test needs to be ordered. Will let dr hilty know.

## 2018-10-07 ENCOUNTER — Ambulatory Visit: Payer: BC Managed Care – PPO | Admitting: Physical Therapy

## 2018-10-07 ENCOUNTER — Ambulatory Visit: Payer: BC Managed Care – PPO | Admitting: Occupational Therapy

## 2018-10-07 DIAGNOSIS — M25611 Stiffness of right shoulder, not elsewhere classified: Secondary | ICD-10-CM

## 2018-10-07 DIAGNOSIS — M6281 Muscle weakness (generalized): Secondary | ICD-10-CM | POA: Diagnosis not present

## 2018-10-07 DIAGNOSIS — I69815 Cognitive social or emotional deficit following other cerebrovascular disease: Secondary | ICD-10-CM

## 2018-10-07 DIAGNOSIS — M79621 Pain in right upper arm: Secondary | ICD-10-CM

## 2018-10-07 LAB — BASIC METABOLIC PANEL
BUN/Creatinine Ratio: 23 (ref 12–28)
BUN: 26 mg/dL (ref 8–27)
CO2: 21 mmol/L (ref 20–29)
Calcium: 9.4 mg/dL (ref 8.7–10.3)
Chloride: 105 mmol/L (ref 96–106)
Creatinine, Ser: 1.13 mg/dL — ABNORMAL HIGH (ref 0.57–1.00)
GFR calc Af Amer: 60 mL/min/{1.73_m2} (ref >59)
GFR calc non Af Amer: 52 mL/min/{1.73_m2} — ABNORMAL LOW (ref >59)
Glucose: 123 mg/dL — ABNORMAL HIGH (ref 65–99)
Potassium: 5.1 mmol/L (ref 3.5–5.2)
Sodium: 140 mmol/L (ref 134–144)

## 2018-10-07 NOTE — Telephone Encounter (Signed)
Prior to MD comment, patient was in office for lab work and requested copy of coronary CT instructions.   LM for patient to call back to discuss MD recommendations

## 2018-10-07 NOTE — Therapy (Signed)
[facility] [address] [place], [place], [address] Phone: [phone]   Fax:  [phone]  Occupational Therapy Treatment  Patient Details  Name: Jocelyn Kaufman MRN: [id number] Date of Birth: [birth year] Referring Provider (OT): Deland Pretty   Encounter Date: 10/07/2018  OT End of Session - 10/07/18 1156    Visit Number  20    Number of Visits  28    Date for OT Re-Evaluation  11/07/18    Authorization Type  BCBS, [place]    Authorization Time Period  renewed 09/30/18 for 2x week x 5 weeks or 10 visits total    OT Start Time  1105    OT Stop Time  1155    OT Time Calculation (min)  50 min    Activity Tolerance  Patient tolerated treatment well    Behavior During Therapy  [facility] for tasks assessed/performed       Past Medical History:  Diagnosis Date  . Adrenal adenoma   . 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 Depression   . Diabetes mellitus without complication ([place])   . Hyperlipidemia   . Hypertension   . Hypothyroidism   . Meralgia paresthetica of right side 01/10/2017  . Migraine   . Pericardial effusion   . 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 APPENDECTOMY    . CYST REMOVAL LEG Right    Removed from right thigh joint  . ORIF HUMERUS FRACTURE Right 07/09/2018   Procedure: OPEN REDUCTION INTERNAL FIXATION (ORIF) HUMERAL SHAFT FRACTURE;  Surgeon: Hiram Gash, MD;  Location: [facility];  Service: Orthopedics;  Laterality: Right;  . TONSILLECTOMY    . TOTAL VAGINAL HYSTERECTOMY    . UMBILICAL HERNIA REPAIR      There were no vitals filed for this visit.  Subjective Assessment - 10/07/18 1206    Subjective   Pt reports that she is trying to get in to see the worker's comp MD    Limitations  Pt is now cleared for ROM as tolerated, and gentle strengthening as tolerated    Currently in Pain?  Yes    Pain Score  3     Pain Location  Shoulder    Pain Orientation   Right    Pain Descriptors / Indicators  Aching    Pain Type  Surgical pain    Pain Onset  More than a month ago    Pain Frequency  Intermittent    Aggravating Factors   malpositioning    Pain Relieving Factors  rest            reatment: supine closed chain shoulder flexion, chest press, 10-15 reps each Pt was tearful today discussing multiple medical issues. Pt requests to place therapy on hold pending her visit with worker's comp MD who previously addressed her rotator cuff. Wall slides x 10 reps, wall push ups x 10 reps Standing unilateral shoulder flexion, extension and abduction,  with 1 lbs weight x 10 reps, then biceps curls x 10 reps with 2 lbs weight Arm bike x 5 mins level 3 for conditioning/ reciprocal movement Hotpack applied to pt's right shoulder  at end of session x 8 mins, no adverse reactions Therapist checked on progress towards goals. Pt is still unable to use curling iron and sewing machine.        ROM measurements from last visit: Shoulder flexion 110 with compensation, 90 without compensation, shoulder abduction  95, shoulder ext 55              OT Short Term Goals - 09/30/18 1135      OT SHORT TERM GOAL #1   Title  Patient will complete an HEP designed to increase elbow through hand AROM DUE 08/29/2018    Status  Achieved      OT SHORT TERM GOAL #2   Title  Patient will demonstrate knowledge and effective technique for scar mobilization and management    Status  Achieved      OT SHORT TERM GOAL #3   Title  Patient will demonstrate understanding of safe transition out of sling schedule    Status  Achieved      OT SHORT TERM GOAL #4   Title  Patient will complete HEP designed for self ROM for right shoulder    Status  Achieved      OT SHORT TERM GOAL #5   Title  Patient will incorporate right hand into daily functional tasks as able, incorporating elbow, flroearm, wrist and hand motion, e.g. writing, feeding, oral hygiene, aspects of  bathing, dresisng    Status  Achieved        OT Long Term Goals - 10/07/18 1132      OT LONG TERM GOAL #1   Title  Patient will complete update HEP designed to improve AROM RUE    Status  Achieved      OT LONG TERM GOAL #2   Title  Patient will demonstrate adequate shoulder range of motion and strength to obtain / place a lightweight (LESS THAN 2LB) object on chest height shelf with RUE    Status  Achieved      OT LONG TERM GOAL #3   Title  Patient will demonstrate adequate strength and range of motion to use a curling iron for hair care - one hand for comb, one hand for iron- updated goals due 11/07/17    Time  5    Period  Weeks    Status  On-going   pt reports she does not have adequate endurance yet     OT LONG TERM GOAL #4   Title  Patient will demonstrate adequate strength and range of motion to apply and remove seat belt, includes buckle (driver's side or passenger side) without report of increased pain    Status  Achieved      OT LONG TERM GOAL #5   Title  Patient will demonstrate adequate range of motion and consistent pressure to use BUE's to guide material through sewing machine    Time  5    Period  Weeks    Status  On-going   does not feel as though arm is strong enough     OT LONG TERM GOAL #6   Title  Pt will demonstrate adequate strength and control to retrieve a 1 lbs weight at 110* shoulder flexion.    Time  5    Period  Weeks    Status  On-going      OT LONG TERM GOAL #7   Title  I with updated HEP.    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 Pt is working  with worker's comp to get in to see the worker's comp MD who initally addressed her rotator cuff injury. Pt requests to be placed on hold at this time. Pt to call to schedule additional visits PRN after seeing  MD for her rotator cuff.    Occupational Profile and client history currently impacting functional performance  retired Pharmacist, hospital, Psychologist, occupational, enjoys chair yoga, Radiographer, therapeutic    Occupational performance deficits (Please refer to evaluation for details):  ADL's;IADL's;Rest and Sleep;Leisure;Social Participation    Rehab Potential  Good    Current Impairments/barriers affecting progress:  pain, fear, need to establish trust     OT Frequency  2x / week    OT Duration  --   5 weeks or 10 visits total   OT Treatment/Interventions  Self-care/ADL training;Therapeutic exercise;Functional Mobility Training;Balance training;Aquatic Therapy;Ultrasound;Neuromuscular education;Manual Therapy;Splinting;Energy conservation;Therapeutic activities;Coping strategies training;DME and/or AE instruction;Cognitive remediation/compensation;Electrical Stimulation;Scar mobilization;Moist Heat;Cryotherapy;Passive range of motion;Patient/family education    Plan  Pt placed on hold, will plan to d/c if pt does not contact therapist in 60 days.    Clinical Decision Making  Several treatment options, min-mod task modification necessary    Consulted and Agree with Plan of Care  Patient       Patient will benefit from skilled therapeutic intervention in order to improve the following deficits and impairments:  Decreased endurance, Decreased skin integrity, Increased muscle spasms, Decreased coping skills, Decreased knowledge of precautions, Decreased scar mobility, Improper body mechanics, Decreased activity tolerance, Decreased knowledge of use of DME, Decreased strength, Impaired flexibility, Decreased balance, Decreased mobility, Difficulty walking, Decreased cognition, Decreased range of motion, Increased edema, Pain, Decreased coordination, Decreased safety awareness, Increased fascial restrictions, Impaired UE functional use  Visit Diagnosis: Muscle weakness (generalized)  Stiffness of right shoulder, not elsewhere  classified  Pain in right upper arm  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 Fall at home, initial encounter 07/06/2018  . Displaced spiral fracture of shaft of humerus, right arm, initial encounter for closed fracture 07/06/2018  . TIA (transient ischemic attack) 07/06/2018  . Allergic rhinitis 07/04/2018  . Normochromic normocytic anemia 01/20/2018  . Rheumatoid arthritis ([place]) 04/30/2017  . Lesion of left humerus 04/29/2017  . Pericardial effusion 03/14/2017  . Diabetes mellitus ([place]) 03/14/2017  . Essential hypertension 03/14/2017  . Mixed hyperlipidemia 03/14/2017  . 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 Meralgia paresthetica of right side 01/10/2017  . Allergic rhinoconjunctivitis 07/09/2015  . Asthma, well controlled 07/09/2015  . Laryngopharyngeal reflux (LPR) 07/09/2015    RINE,KATHRYN 10/07/2018, 12:07 PM Theone Murdoch, OTR/L [phone] Phone: [phone] 12:11 PM 10/07/18 [facility] [address] [place], [place], [address] Phone: [phone]   Fax:  [phone]  Name: Jocelyn Kaufman MRN: [id number] Date of Birth: 15-Jun-1956

## 2018-10-07 NOTE — Telephone Encounter (Addendum)
Spoke with patient and provided MD recommendations concerning testing. She is agreeable to [facility]. Test ordered and message sent to scheduler to arrange.   She reports she has stage 3 CKD. Will reach out to [facility] for records

## 2018-10-07 NOTE — Telephone Encounter (Signed)
Ok to cancel cardiac CTA - would like to pursue ischemia evaluation with lexiscan myoview at [facility] for NSVT.  Thanks,  Dr. Lemmie Evens

## 2018-10-08 ENCOUNTER — Ambulatory Visit
Admission: RE | Admit: 2018-10-08 | Discharge: 2018-10-08 | Disposition: A | Discharge status: Home / Self Care | Payer: BC Managed Care – PPO | Source: Ambulatory Visit | Attending: Nephrology | Admitting: Nephrology

## 2018-10-08 ENCOUNTER — Other Ambulatory Visit: Payer: Self-pay | Admitting: Nephrology

## 2018-10-08 DIAGNOSIS — N183 Chronic kidney disease, stage 3 unspecified: Secondary | ICD-10-CM

## 2018-10-09 ENCOUNTER — Encounter: Payer: BC Managed Care – PPO | Admitting: Occupational Therapy

## 2018-10-10 ENCOUNTER — Ambulatory Visit (INDEPENDENT_AMBULATORY_CARE_PROVIDER_SITE_OTHER): Payer: BC Managed Care – PPO | Admitting: Neurology

## 2018-10-10 ENCOUNTER — Encounter: Payer: Self-pay | Admitting: Neurology

## 2018-10-10 VITALS — BP 153/79 | HR 68 | Ht 64.5 in | Wt 236.0 lb

## 2018-10-10 DIAGNOSIS — G459 Transient cerebral ischemic attack, unspecified: Secondary | ICD-10-CM | POA: Diagnosis not present

## 2018-10-10 DIAGNOSIS — R202 Paresthesia of skin: Secondary | ICD-10-CM

## 2018-10-10 NOTE — Progress Notes (Signed)
Reason for visit: TIA  Referring physician: Dr. Karolee Stamps is a 62 y.o. female  History of present illness:  Ms. Lonsway is a 62 year old right-handed black female with a history of diabetes, dyslipidemia, and hypertension.  The patient was admitted to the hospital on 06 July 2018.  The patient began having symptoms the day before with dizziness, she was dizzy all day long and did not feel well.  The patient continued to have some dizziness the next day, at one point she had stood up to get a drink of water because she felt hot and then she fell over and fractured the humerus on the right.  From that point on, she began having a stuttering speech problem, word finding issues that persisted for 6 or 7 hours and then finally cleared.  The patient had some right-sided numbness and weakness, she went to the hospital and was admitted for a stroke evaluation.  MRI of the brain was done and did not show evidence of a stroke.  The dizziness problem persisted for 3 or 4 weeks, and then was intermittent from that point but has gone away at this time.  The patient had MRA of the head that showed atherosclerotic changes in the carotid siphon on the right, carotid Doppler study was unremarkable, 2D echocardiogram was unremarkable.  The patient was placed on aspirin, her LDL was elevated at 126.  The patient has since been started on Lipitor.  She comes to this office for an evaluation.  She checks her blood pressures at home, they generally run in the 097-353 range systolic.  The patient indicates that she has had dizziness in the past that has resolved, the last such event was in April 2018.  Past Medical History:  Diagnosis Date  . Adrenal adenoma   . 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 Depression   . Diabetes mellitus without complication ([place])   . Hyperlipidemia   . Hypertension   . Hypothyroidism   . Meralgia paresthetica of right side 01/10/2017  . Migraine   . Pericardial effusion   . Ulnar  neuropathy at wrist, 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    Past Surgical History:  Procedure Laterality Date  . APPENDECTOMY    . CYST REMOVAL LEG Right    Removed from right thigh joint  . ORIF HUMERUS FRACTURE Right 07/09/2018   Procedure: OPEN REDUCTION INTERNAL FIXATION (ORIF) HUMERAL SHAFT FRACTURE;  Surgeon: Hiram Gash, MD;  Location: [facility];  Service: Orthopedics;  Laterality: Right;  . TONSILLECTOMY    . TOTAL VAGINAL HYSTERECTOMY    . UMBILICAL HERNIA REPAIR      Family History  Problem Relation Age of Onset  . Hypertension Mother   . Stroke Mother   . Kidney failure Mother   . Heart disease Mother        heart murmur  . Glaucoma Mother   . Stroke Father   . COPD Father   . Cancer - Prostate Father   . Colon cancer Father     Social history:  reports that she has never smoked. She has never used smokeless tobacco. She reports current alcohol use. She reports that she does not use drugs.  Medications:  Prior to Admission medications   Medication Sig Start Date End Date Taking? Authorizing Provider  albuterol (PROVENTIL HFA;VENTOLIN HFA) 108 (90 Base) MCG/ACT inhaler Inhale two puffs every four to six hours as needed for cough or wheeze. Patient taking differently: Inhale 2 puffs into the  lungs every 4 (four) hours as needed for wheezing (or cough).  07/04/18  Yes Ambs, Kathrine Cords, FNP  amLODipine (NORVASC) 5 MG tablet Take 2.5 mg by mouth daily.  06/04/16  Yes [provider]  aspirin 325 MG tablet Take 1 tablet (325 mg total) by mouth daily. 07/13/18  Yes Bonnell Public, MD  atorvastatin (LIPITOR) 40 MG tablet Take 1 tablet (40 mg total) by mouth daily at 6 PM. 07/12/18  Yes Ogbata, Babs Bertin, MD  cetirizine (ZYRTEC) 10 MG tablet Take 10 mg by mouth daily.   Yes [provider]  Cholecalciferol (VITAMIN D-3 PO) Take 50,000 Units by mouth every Monday.    Yes [provider]  Dulaglutide (TRULICITY) 1.5 OQ/9.4TM SOPN Inject 1.5 mg into the skin once a week.     Yes [provider]  EPINEPHrine (EPIPEN 2-PAK) 0.3 mg/0.3 mL IJ SOAJ injection Use as directed for life-threatening allergic reaction. Patient taking differently: Inject 0.3 mg into the muscle once. for life-threatening allergic reaction. 08/21/16  Yes Kozlow, Donnamarie Poag, MD  escitalopram (LEXAPRO) 20 MG tablet Take 20 mg by mouth daily.   Yes [provider]  ferrous sulfate 325 (65 FE) MG tablet Take 365 mg by mouth daily.    Yes [provider]  folic acid (FOLVITE) 1 MG tablet Take 2 mg by mouth daily.   Yes [provider]  Golimumab ([place] Bliss) Inject into the skin.   Yes [provider]  hydrALAZINE (APRESOLINE) 25 MG tablet Take 25 mg by mouth 2 (two) times daily.   Yes [provider]  irbesartan (AVAPRO) 300 MG tablet Take 150 mg by mouth daily.   Yes [provider]  levothyroxine (SYNTHROID) 125 MCG tablet Take 125 mcg by mouth daily before breakfast. BRAND ONLY   Yes [provider]  LORazepam (ATIVAN) 0.5 MG tablet Take 0.5 mg by mouth as needed for anxiety.   Yes [provider]  Magnesium 500 MG TABS Take 500 mg by mouth daily.    Yes [provider]  metaxalone (SKELAXIN) 800 MG tablet Take 400 mg by mouth daily as needed for muscle spasms.    Yes [provider]  Methotrexate, Anti-Rheumatic, (METHOTREXATE, PF, [place]) Inject 0.8 Units into the skin once a week.   Yes [provider]  metoprolol succinate (TOPROL-XL) 25 MG 24 hr tablet Take 1 tablet (25 mg total) by mouth daily. 09/03/18  Yes Hilty, Nadean Corwin, MD  Multiple Vitamins-Minerals (CENTRUM SILVER PO) Take 1 tablet by mouth daily.   Yes [provider]  TRAVATAN Z 0.004 % SOLN ophthalmic solution Place 0.004 drops into both eyes daily. 07/21/16  Yes [provider]  vitamin B-12 (CYANOCOBALAMIN) 100 MCG tablet Take 100 mcg by mouth daily.    Yes [provider]  VITAMIN D, ERGOCALCIFEROL, PO Take  2,000 mg by mouth 2 (two) times daily.   Yes [provider]      Allergies  Allergen Reactions  . Invokana [Canagliflozin] Other (See Comments)    Urinary incontinence  . Percocet [Oxycodone-Acetaminophen] Nausea And Vomiting  . Prilosec [Omeprazole] Rash  . Rogaine [Minoxidil] Rash  . Victoza [Liraglutide] Itching    ROS:  Out of a complete 14 system review of symptoms, the patient complains only of the following symptoms, and all other reviewed systems are negative.  Fatigue Anemia Memory loss, confusion, weakness  Blood pressure (!) 153/79, pulse 68, height 5' 4.5" (1.638 m), weight 236 lb (107 kg).  Physical Exam  General: The patient is alert and cooperative at the time of the examination.  The patient is markedly obese.  Eyes: Pupils are equal, round, and reactive to light. Discs are flat bilaterally.  Ears: Tympanic membranes are clear bilaterally.  Neck: The neck is supple, no carotid bruits are noted.  Respiratory: The respiratory examination is clear.  Cardiovascular: The cardiovascular examination reveals a regular rate and rhythm, no obvious murmurs or rubs are noted.  Skin: Extremities are without significant edema.  Neurologic Exam  Mental status: The patient is alert and oriented x 3 at the time of the examination. The patient has apparent normal recent and remote memory, with an apparently normal attention span and concentration ability.  Cranial nerves: Facial symmetry is present. There is good sensation of the face to pinprick and soft touch bilaterally. The strength of the facial muscles and the muscles to head turning and shoulder shrug are normal bilaterally. Speech is well enunciated, no aphasia or dysarthria is noted. Extraocular movements are full. Visual fields are full. The tongue is midline, and the patient has symmetric elevation of the soft palate. No obvious hearing deficits are noted.  Motor: The motor testing reveals 5 over 5  strength of all 4 extremities. Good symmetric motor tone is noted throughout.  Sensory: Sensory testing is intact to pinprick, soft touch, vibration sensation, and position sense on all 4 extremities, with exception of some slight decrease in pinprick sensation on the right forearm and the right lower leg. No evidence of extinction is noted.  Coordination: Cerebellar testing reveals good finger-nose-finger and heel-to-shin bilaterally.  Gait and station: Gait is normal. Tandem gait is unsteady. Romberg is negative. No drift is seen.  Reflexes: Deep tendon reflexes are symmetric, but are depressed bilaterally. Toes are downgoing bilaterally.   Assessment/Plan:  1.  Possible TIA event, dizziness  The patient does have multiple risk factors for stroke.  The patient had dizziness lasting several weeks and had speech changes and weakness lasting 6 or 7 hours, but no stroke was seen by MRI of the brain.  The patient is still running blood pressures that are too high for a diabetic, I have indicated that the systolic pressure should be always less than 130, she is now being treated for her dyslipidemia.  She will remain on low-dose aspirin.  I have recommended she get into a low-grade exercise program and aggressively pursue a low carbohydrate diet and weight loss.  She will follow-up through this office on an as-needed basis.  Jill Alexanders MD 10/10/2018 10:43 AM  [facility] [address] [place], [place] [address]  Phone [phone] Fax [phone]

## 2018-10-13 ENCOUNTER — Telehealth (HOSPITAL_COMMUNITY): Payer: Self-pay | Admitting: *Deleted

## 2018-10-13 NOTE — Telephone Encounter (Signed)
Left message on voicemail per DPR in reference to upcoming appointment scheduled on 12/18/ 19at 0830 with detailed instructions given per Myocardial Perfusion Study Information Sheet for the test. LM to arrive 15 minutes early, and that it is imperative to arrive on time for appointment to keep from having the test rescheduled. If you need to cancel or reschedule your appointment, please call the office within 24 hours of your appointment. Failure to do so may result in a cancellation of your appointment, and a $50 no show fee. Phone number given for call back for any questions. Marylene Buerger, Ranae Palms

## 2018-10-14 ENCOUNTER — Encounter: Payer: BC Managed Care – PPO | Admitting: Occupational Therapy

## 2018-10-14 ENCOUNTER — Ambulatory Visit (HOSPITAL_COMMUNITY): Payer: BC Managed Care – PPO

## 2018-10-15 ENCOUNTER — Ambulatory Visit (HOSPITAL_COMMUNITY): Payer: BC Managed Care – PPO | Attending: Cardiovascular Disease

## 2018-10-15 DIAGNOSIS — R0602 Shortness of breath: Secondary | ICD-10-CM | POA: Diagnosis not present

## 2018-10-15 DIAGNOSIS — I472 Ventricular tachycardia: Secondary | ICD-10-CM | POA: Diagnosis not present

## 2018-10-15 DIAGNOSIS — R079 Chest pain, unspecified: Secondary | ICD-10-CM | POA: Diagnosis not present

## 2018-10-15 DIAGNOSIS — I4729 Other ventricular tachycardia: Secondary | ICD-10-CM

## 2018-10-15 MED ORDER — REGADENOSON 0.4 MG/5ML IV SOLN
0.4000 mg | Freq: Once | INTRAVENOUS | Status: AC
  Administered 2018-10-15: 0.4 mg via INTRAVENOUS

## 2018-10-15 MED ORDER — TECHNETIUM TC 99M TETROFOSMIN IV KIT
27.5000 | PACK | Freq: Once | INTRAVENOUS | Status: AC | PRN
  Administered 2018-10-15: 27.5 via INTRAVENOUS
  Filled 2018-10-15: qty 28

## 2018-10-16 ENCOUNTER — Ambulatory Visit (HOSPITAL_COMMUNITY): Payer: BC Managed Care – PPO | Attending: Cardiology

## 2018-10-16 ENCOUNTER — Encounter: Payer: BC Managed Care – PPO | Admitting: Occupational Therapy

## 2018-10-16 LAB — MYOCARDIAL PERFUSION IMAGING
LV dias vol: 105 mL (ref 46–106)
LV sys vol: 45 mL
Peak HR: 86 {beats}/min
Rest HR: 67 {beats}/min
SDS: 1
SRS: 0
SSS: 1
TID: 1.12

## 2018-10-16 MED ORDER — TECHNETIUM TC 99M TETROFOSMIN IV KIT
31.6000 | PACK | Freq: Once | INTRAVENOUS | Status: AC | PRN
  Administered 2018-10-16: 31.6 via INTRAVENOUS
  Filled 2018-10-16: qty 32

## 2018-10-17 ENCOUNTER — Ambulatory Visit (INDEPENDENT_AMBULATORY_CARE_PROVIDER_SITE_OTHER): Payer: BC Managed Care – PPO | Admitting: *Deleted

## 2018-10-17 DIAGNOSIS — J309 Allergic rhinitis, unspecified: Secondary | ICD-10-CM

## 2018-10-23 ENCOUNTER — Encounter: Payer: BC Managed Care – PPO | Admitting: Occupational Therapy

## 2018-10-24 ENCOUNTER — Ambulatory Visit (INDEPENDENT_AMBULATORY_CARE_PROVIDER_SITE_OTHER): Payer: BC Managed Care – PPO | Admitting: *Deleted

## 2018-10-24 ENCOUNTER — Encounter: Payer: BC Managed Care – PPO | Admitting: Occupational Therapy

## 2018-10-24 DIAGNOSIS — J309 Allergic rhinitis, unspecified: Secondary | ICD-10-CM | POA: Diagnosis not present

## 2018-10-28 ENCOUNTER — Encounter: Payer: BC Managed Care – PPO | Admitting: Occupational Therapy

## 2018-10-31 ENCOUNTER — Ambulatory Visit (INDEPENDENT_AMBULATORY_CARE_PROVIDER_SITE_OTHER): Payer: BC Managed Care – PPO | Admitting: *Deleted

## 2018-10-31 DIAGNOSIS — J309 Allergic rhinitis, unspecified: Secondary | ICD-10-CM | POA: Diagnosis not present

## 2018-11-03 DIAGNOSIS — M19011 Primary osteoarthritis, right shoulder: Secondary | ICD-10-CM | POA: Insufficient documentation

## 2018-11-03 DIAGNOSIS — M25511 Pain in right shoulder: Secondary | ICD-10-CM | POA: Insufficient documentation

## 2018-11-07 ENCOUNTER — Ambulatory Visit (INDEPENDENT_AMBULATORY_CARE_PROVIDER_SITE_OTHER): Payer: BC Managed Care – PPO | Admitting: *Deleted

## 2018-11-07 DIAGNOSIS — J309 Allergic rhinitis, unspecified: Secondary | ICD-10-CM

## 2018-11-10 ENCOUNTER — Ambulatory Visit (HOSPITAL_COMMUNITY): Payer: BC Managed Care – PPO

## 2018-11-11 ENCOUNTER — Ambulatory Visit (HOSPITAL_COMMUNITY): Payer: BC Managed Care – PPO

## 2018-11-13 ENCOUNTER — Encounter: Payer: Self-pay | Admitting: Internal Medicine

## 2018-11-13 ENCOUNTER — Ambulatory Visit: Payer: BC Managed Care – PPO | Admitting: Internal Medicine

## 2018-11-13 VITALS — BP 134/66 | HR 73 | Ht 64.5 in | Wt 236.8 lb

## 2018-11-13 DIAGNOSIS — I4729 Other ventricular tachycardia: Secondary | ICD-10-CM

## 2018-11-13 DIAGNOSIS — I472 Ventricular tachycardia: Secondary | ICD-10-CM

## 2018-11-13 DIAGNOSIS — R079 Chest pain, unspecified: Secondary | ICD-10-CM | POA: Diagnosis not present

## 2018-11-13 NOTE — Progress Notes (Signed)
OFFICE CONSULT NOTE  Chief Complaint:  Follow-up NSVT  Primary Care Physician: Deland Pretty, MD  HPI:  Jocelyn Kaufman is a 63 y.o. female who is being seen today for the evaluation of intermittent chest pressure at the request of Deland Pretty, MD. Jocelyn Kaufman is a 63 year old female with a history of type 2 diabetes, depression, hyperlipidemia, hypertension, left ulnar neuropathy, possible obstructive Kaufman apnea, and recent extensive workup at the [facility] for possible Cushing's disease. Per their report it does not seem that she has this although she was noted to have bilateral adrenal hyperplasia. I first met Jocelyn Kaufman in 2012 which time she was hospitalized for chest pain and underwent a heart catheterization after an abnormal nuclear stress test, which I performed demonstrating normal coronary arteries. As part of her workup at the [facility] she underwent an echocardiogram. I reviewed the echocardiogram report but did not have the images to directly review. This demonstrated an LVEF of 55%, no regional wall motion abnormalities, mild left atrial enlargement, and a small, hemodynamically significant pericardial effusion. RVSP was normal at 32 mmHg. The study was performed on 02/11/2017. She says that over the past week or 2 she's had some more intermittent chest pressure. This is substernal and does not radiate. It's not worse with exertion or relieved by rest. The etiology of her pericardial effusion is not clear however NIH suggested a workup for immune disorders. When I asked her about family history, she was somewhat unclear about whether autoimmune disorders were in her family. She also inquired today for me to fill out paperwork supporting disability. She is a 6 grade teacher. I reiterated that I did not believe that at this point she hasn't developed disabling cardiovascular condition, although she has multiple medical problems which are currently being addressed by her primary  care provider.  04/30/2017  Jocelyn Kaufman returns today for follow-up. As per our previous plan she underwent a repeat echocardiogram to evaluate her small pericardial effusion. The study on 03/28/2017 showed an EF of 62-13%, grade 2 diastolic dysfunction and a small to moderate sized pericardial effusion without tamponade features. We attempted to contact the patient on 03/29/2017 and 04/01/2017 but received a smart call blocker response. She was then notified of results and a copy of the echo report was left per her request at the front desk (see notes attached to the echo dated 03/28/2017). Based on those findings and the acute change in size of the pericardial effusion, I recommended a repeat echo in one month to rule out any worsening pericardial effusion. She expressed interest in following up after that limited echo. A limited echo to evaluate the pericardial effusion was performed yesterday on 04/29/2017. I personally reviewed the study and EF has improved to 60-65%, again grade 2 diastolic dysfunction is noted with a much smaller pericardial effusion. There was no evidence of tamponade physiology. When we initially spoke she reported that she had not received any information about the studies and was unclear why she needed to echoes. She seems somewhat frustrated and overwhelmed with the myriad of doctors that she's been seeing. Recently she was seen by Dr. Kathlene November, a rheumatologist, who diagnosed her with rheumatoid arthritis. She's been started on methotrexate. She says that she has not noticed a significant difference in swelling of her hands, however it may be improving her pericardial effusion. She does not report any significant persistent chest discomfort or shortness of breath, rather occasional episodes.  10/17/2017  Jocelyn Kaufman returns  today for follow-up.  It has been 6 months since I last saw her.  Her recent echo in July 2018 showed a slightly higher EF of 60-65% with a small pericardial effusion  which was improved.  Overall she feels much better.  She has started on a medication called Simponi for her RA in addition to methotrexate.  She is only had one dose but she responded well to it.  She denies any further chest heaviness, pressure pain or shortness of breath.  06/27/2018  Jocelyn Kaufman is seen today in follow-up.  She underwent a limited echo on 05/02/2018 which showed a small, mostly posterior pericardial effusion which is essentially unchanged.  LVEF was normal at 60 to 65%.  Her echoes have been stable compared to studies back in 2018 and the degree of effusion is much less than was originally identified when she underwent evaluation at the [facility].  This is likely related to RA.  Subsequently she has been diagnosed with fibromyalgia.  She is also on insulin and Jardiance for control of blood sugars.  In general she says she feels much better than she had in the past.  09/05/2018  Jocelyn Kaufman is seen today in follow-up.  Recently I had seen her and she had a stable trivial pericardial effusion.  Based on no subsequent changes I felt follow-up could be as necessary.  Subsequently she was hospitalized after an episode of possible TIA including speech difficulties and associated fall, sustaining a humeral fracture that required open reduction and internal fixation.  Post discharge she was placed on a monitor which did show brief episodes of nonsustained VT and PVCs.  She is subsequently referred back for evaluation of nonsustained VT.  He denies any chest pain.  It was noted that she had normal coronary arteries in 2012 by heart catheterization.  11/13/2018  Jocelyn Kaufman returns today for follow-up.  She believes that the beta-blocker has suppressed her NSVT and PVCs.  She was ordered for a CT coronary angiogram however due to concerns by her nephrologist about dye contrast, she was switched to a [employer].  Fortunately this Lexiscan Myoview was negative for ischemia and showed normal LV  function.  PMHx:  Past Medical History:  Diagnosis Date  . Adrenal adenoma   . 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 Depression   . Diabetes mellitus without complication ([place])   . Hyperlipidemia   . Hypertension   . Hypothyroidism   . Meralgia paresthetica of right side 01/10/2017  . Migraine   . Pericardial effusion   . 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 APPENDECTOMY    . CYST REMOVAL LEG Right    Removed from right thigh joint  . ORIF HUMERUS FRACTURE Right 07/09/2018   Procedure: OPEN REDUCTION INTERNAL FIXATION (ORIF) HUMERAL SHAFT FRACTURE;  Surgeon: Hiram Gash, MD;  Location: [facility];  Service: Orthopedics;  Laterality: Right;  . TONSILLECTOMY    . TOTAL VAGINAL HYSTERECTOMY    . UMBILICAL HERNIA REPAIR      FAMHx:  Family History  Problem Relation Age of Onset  . Hypertension Mother   . Stroke Mother   . Kidney failure Mother   . Heart disease Mother        heart murmur  . Glaucoma Mother   . Stroke Father   . COPD Father   . Cancer - Prostate Father   . Colon cancer Father  SOCHx:   reports that she has never smoked. She has never used smokeless tobacco. She reports current alcohol use. She reports that she does not use drugs.  ALLERGIES:  Allergies  Allergen Reactions  . Invokana [Canagliflozin] Other (See Comments)    Urinary incontinence  . Percocet [Oxycodone-Acetaminophen] Nausea And Vomiting  . Prilosec [Omeprazole] Rash  . Rogaine [Minoxidil] Rash  . Victoza [Liraglutide] Itching    ROS: Pertinent items noted in HPI and remainder of comprehensive ROS otherwise negative.  HOME MEDS: Current Outpatient Medications on File Prior to Visit  Medication Sig Dispense Refill  . albuterol (PROVENTIL HFA;VENTOLIN HFA) 108 (90 Base) MCG/ACT inhaler Inhale two puffs every four to six hours as needed for cough or wheeze. (Patient taking differently: Inhale 2 puffs into the lungs every 4 (four)  hours as needed for wheezing (or cough). ) 1 Inhaler 1  . amLODipine (NORVASC) 5 MG tablet Take 2.5 mg by mouth daily.     Marland Kitchen aspirin 325 MG tablet Take 1 tablet (325 mg total) by mouth daily. 30 tablet 0  . atorvastatin (LIPITOR) 40 MG tablet Take 1 tablet (40 mg total) by mouth daily at 6 PM. 30 tablet 0  . calcium carbonate (CALCIUM 600) 1500 (600 Ca) MG TABS tablet Calcium 600    . cetirizine (ZYRTEC) 10 MG tablet Take 10 mg by mouth daily.    . Dulaglutide (TRULICITY) 1.5 VO/1.6WV SOPN Inject 1.5 mg into the skin once a week.     Marland Kitchen EPINEPHrine (EPIPEN 2-PAK) 0.3 mg/0.3 mL IJ SOAJ injection Use as directed for life-threatening allergic reaction. (Patient taking differently: Inject 0.3 mg into the muscle once. for life-threatening allergic reaction.) 2 Device 1  . escitalopram (LEXAPRO) 20 MG tablet Take 20 mg by mouth daily.    . ferrous sulfate 325 (65 FE) MG tablet Take 365 mg by mouth daily.     . folic acid (FOLVITE) 1 MG tablet Take 1 mg by mouth 2 (two) times daily.     . Golimumab (SIMPONI [place]) Inject into the skin.    . hydrALAZINE (APRESOLINE) 25 MG tablet Take 25 mg by mouth 2 (two) times daily.    . irbesartan (AVAPRO) 300 MG tablet Take 150 mg by mouth daily.    Marland Kitchen levothyroxine (SYNTHROID) 125 MCG tablet Take 125 mcg by mouth daily before breakfast. BRAND ONLY    . LORazepam (ATIVAN) 0.5 MG tablet Take 0.5 mg by mouth as needed for anxiety.    . metaxalone (SKELAXIN) 800 MG tablet Take 400 mg by mouth daily as needed for muscle spasms.     . Methotrexate, Anti-Rheumatic, (METHOTREXATE, PF, [place]) Inject 0.8 Units into the skin once a week.    . metoprolol succinate (TOPROL-XL) 25 MG 24 hr tablet Take 1 tablet (25 mg total) by mouth daily. 90 tablet 3  . Multiple Vitamins-Minerals (CENTRUM SILVER PO) Take 1 tablet by mouth daily.    . TRAVATAN Z 0.004 % SOLN ophthalmic solution Place 0.004 drops into both eyes daily.    . vitamin B-12 (CYANOCOBALAMIN) 100 MCG tablet Take 100 mcg by  mouth daily.     Marland Kitchen VITAMIN D, ERGOCALCIFEROL, PO Take 2,000 mg by mouth 2 (two) times daily.    . Magnesium 500 MG TABS Take 500 mg by mouth daily.      No current facility-administered medications on file prior to visit.     LABS/IMAGING: No results found for this or any previous visit (from the past 48 hour(s)). No  results found.  LIPID PANEL:    Component Value Date/Time   CHOL 221 (H) 07/07/2018 0514   TRIG 266 (H) 07/07/2018 0514   HDL 42 07/07/2018 0514   CHOLHDL 5.3 07/07/2018 0514   VLDL 53 (H) 07/07/2018 0514   LDLCALC 126 (H) 07/07/2018 0514    WEIGHTS: Wt Readings from Last 3 Encounters:  11/13/18 236 lb 12.8 oz (107.4 kg)  10/15/18 233 lb (105.7 kg)  10/10/18 236 lb (107 kg)    VITALS: BP 134/66   Pulse 73   Ht 5' 4.5" (1.638 m)   Wt 236 lb 12.8 oz (107.4 kg)   BMI 40.02 kg/m   EXAM: Defeerred  EKG: Deferred  ASSESSMENT: 1. Mechanical fall-no syncope 2. Possible TIA 3. NSVT -low risk Myoview stress test (09/2018) 4. Pericardial effusion -small, stable, likely related to rheumatoid arthritis 5. Newly diagnosed rheumatoid arthritis 6. Fibromyalgia 7. History of false positive Myoview in 2012 with normal coronaries by catheterization 8. Bilateral adrenal hyperplasia without Cushing's disease (per NIH work-up) 9. Dependent diabetes  PLAN: 1.   Jocelyn Kaufman had a low risk Myoview stress test.  This suggest that her NSVT is not likely a high risk finding.  Especially since she had recent echo that showed normal LV function.  Her NSVT/PVCs are suppressed on beta-blocker.  I would continue that indefinitely.  Follow-up with me can be as needed.  Pixie Casino, MD, [facility] [place] Director of the Advanced Lipid Disorders &  Cardiovascular Risk Reduction Clinic Diplomate of the American Board of Clinical Lipidology Attending Cardiologist  Direct Dial: [phone]  Fax: [phone]  Website:   [email or web address] Judah [email or web address] 11/13/2018, 10:07 AM

## 2018-11-13 NOTE — Patient Instructions (Signed)
Medication Instructions:  Continue current medications If you need a refill on your cardiac medications before your next appointment, please call your pharmacy.   Follow-Up: At [facility], you and your health needs are our priority.  As part of our continuing mission to provide you with exceptional heart care, we have created designated Provider Care Teams.  These Care Teams include your primary Cardiologist (physician) and Advanced Practice Providers (APPs -  Physician Assistants and Nurse Practitioners) who all work together to provide you with the care you need, when you need it. You will need a follow up appointment as needed with Dr. Debara Pickett. If you need an appointment you may see Dr. Debara Pickett or one of the following Advanced Practice Providers on your designated Care Team: Almyra Deforest, [place] . Fabian Sharp, PA-C  Any Other Special Instructions Will Be Listed Below (If Applicable).

## 2018-11-14 ENCOUNTER — Ambulatory Visit (INDEPENDENT_AMBULATORY_CARE_PROVIDER_SITE_OTHER): Payer: BC Managed Care – PPO | Admitting: *Deleted

## 2018-11-14 DIAGNOSIS — J309 Allergic rhinitis, unspecified: Secondary | ICD-10-CM | POA: Diagnosis not present

## 2018-11-25 DIAGNOSIS — M7541 Impingement syndrome of right shoulder: Secondary | ICD-10-CM | POA: Insufficient documentation

## 2018-12-15 ENCOUNTER — Ambulatory Visit (INDEPENDENT_AMBULATORY_CARE_PROVIDER_SITE_OTHER): Payer: BC Managed Care – PPO

## 2018-12-15 DIAGNOSIS — J309 Allergic rhinitis, unspecified: Secondary | ICD-10-CM

## 2018-12-25 ENCOUNTER — Telehealth: Payer: Self-pay | Admitting: Internal Medicine

## 2018-12-25 NOTE — Telephone Encounter (Signed)
New Message   Jocelyn Kaufman is calling to follow up on a Disability form for the PT that was faxed over 2 weeks ago  Please call back

## 2018-12-25 NOTE — Telephone Encounter (Signed)
Per medical records spoke with pt and info was sent via my chart and addressed per pt ./cy

## 2018-12-29 ENCOUNTER — Ambulatory Visit: Payer: BC Managed Care – PPO | Admitting: Adult Health

## 2018-12-30 ENCOUNTER — Telehealth: Payer: Self-pay | Admitting: Internal Medicine

## 2018-12-30 NOTE — Telephone Encounter (Signed)
FORWARD TO HIM DEPT TO DISCUSS ABOUT FORMS

## 2018-12-30 NOTE — Telephone Encounter (Signed)
New Message   Jocelyn Kaufman is calling about the disability form for the Pt.  Please call back

## 2019-01-06 ENCOUNTER — Ambulatory Visit: Payer: BC Managed Care – PPO | Admitting: Adult Health

## 2019-01-12 ENCOUNTER — Ambulatory Visit (INDEPENDENT_AMBULATORY_CARE_PROVIDER_SITE_OTHER): Payer: BC Managed Care – PPO | Admitting: *Deleted

## 2019-01-12 ENCOUNTER — Other Ambulatory Visit: Payer: Self-pay

## 2019-01-12 DIAGNOSIS — J309 Allergic rhinitis, unspecified: Secondary | ICD-10-CM | POA: Diagnosis not present

## 2019-01-23 ENCOUNTER — Telehealth: Payer: Self-pay | Admitting: Internal Medicine

## 2019-01-23 NOTE — Telephone Encounter (Signed)
01/22/2019 Received [employer] [place] Attending Physicians Statement from [place] I gave form to [place] for him to fill out.  cbr

## 2019-02-10 ENCOUNTER — Ambulatory Visit (INDEPENDENT_AMBULATORY_CARE_PROVIDER_SITE_OTHER): Payer: BC Managed Care – PPO | Admitting: *Deleted

## 2019-02-10 DIAGNOSIS — J309 Allergic rhinitis, unspecified: Secondary | ICD-10-CM

## 2019-02-17 NOTE — Progress Notes (Signed)
VIALS EXP 02-17-2020

## 2019-02-18 DIAGNOSIS — J301 Allergic rhinitis due to pollen: Secondary | ICD-10-CM | POA: Diagnosis not present

## 2019-03-02 ENCOUNTER — Ambulatory Visit: Payer: BC Managed Care – PPO | Admitting: Adult Health

## 2019-03-02 ENCOUNTER — Ambulatory Visit: Payer: Self-pay | Admitting: Family Medicine

## 2019-03-11 ENCOUNTER — Ambulatory Visit (INDEPENDENT_AMBULATORY_CARE_PROVIDER_SITE_OTHER): Payer: BC Managed Care – PPO | Admitting: *Deleted

## 2019-03-11 DIAGNOSIS — J309 Allergic rhinitis, unspecified: Secondary | ICD-10-CM

## 2019-04-10 ENCOUNTER — Ambulatory Visit (INDEPENDENT_AMBULATORY_CARE_PROVIDER_SITE_OTHER): Payer: BC Managed Care – PPO | Admitting: *Deleted

## 2019-04-10 DIAGNOSIS — J309 Allergic rhinitis, unspecified: Secondary | ICD-10-CM | POA: Diagnosis not present

## 2019-05-08 ENCOUNTER — Ambulatory Visit (INDEPENDENT_AMBULATORY_CARE_PROVIDER_SITE_OTHER): Payer: BC Managed Care – PPO | Admitting: *Deleted

## 2019-05-08 DIAGNOSIS — J309 Allergic rhinitis, unspecified: Secondary | ICD-10-CM | POA: Diagnosis not present

## 2019-05-15 ENCOUNTER — Ambulatory Visit (INDEPENDENT_AMBULATORY_CARE_PROVIDER_SITE_OTHER): Payer: BC Managed Care – PPO | Admitting: *Deleted

## 2019-05-15 DIAGNOSIS — J309 Allergic rhinitis, unspecified: Secondary | ICD-10-CM

## 2019-05-25 ENCOUNTER — Ambulatory Visit (INDEPENDENT_AMBULATORY_CARE_PROVIDER_SITE_OTHER): Payer: BC Managed Care – PPO

## 2019-05-25 DIAGNOSIS — J309 Allergic rhinitis, unspecified: Secondary | ICD-10-CM | POA: Diagnosis not present

## 2019-06-02 ENCOUNTER — Ambulatory Visit (INDEPENDENT_AMBULATORY_CARE_PROVIDER_SITE_OTHER): Payer: BC Managed Care – PPO | Admitting: *Deleted

## 2019-06-02 DIAGNOSIS — J309 Allergic rhinitis, unspecified: Secondary | ICD-10-CM

## 2019-06-19 ENCOUNTER — Ambulatory Visit (INDEPENDENT_AMBULATORY_CARE_PROVIDER_SITE_OTHER): Payer: BC Managed Care – PPO

## 2019-06-19 DIAGNOSIS — J309 Allergic rhinitis, unspecified: Secondary | ICD-10-CM | POA: Diagnosis not present

## 2019-06-29 ENCOUNTER — Ambulatory Visit (INDEPENDENT_AMBULATORY_CARE_PROVIDER_SITE_OTHER): Payer: BC Managed Care – PPO

## 2019-06-29 DIAGNOSIS — J309 Allergic rhinitis, unspecified: Secondary | ICD-10-CM

## 2019-07-24 ENCOUNTER — Other Ambulatory Visit: Payer: Self-pay | Admitting: Family Medicine

## 2019-07-31 ENCOUNTER — Encounter: Payer: Self-pay | Admitting: Allergy

## 2019-07-31 ENCOUNTER — Other Ambulatory Visit: Payer: Self-pay

## 2019-07-31 ENCOUNTER — Ambulatory Visit (INDEPENDENT_AMBULATORY_CARE_PROVIDER_SITE_OTHER): Payer: BC Managed Care – PPO | Admitting: Allergy

## 2019-07-31 VITALS — BP 148/76 | HR 78 | Temp 97.2°F | Resp 18 | Ht 63.0 in | Wt 243.8 lb

## 2019-07-31 DIAGNOSIS — J4521 Mild intermittent asthma with (acute) exacerbation: Secondary | ICD-10-CM

## 2019-07-31 DIAGNOSIS — J3089 Other allergic rhinitis: Secondary | ICD-10-CM | POA: Diagnosis not present

## 2019-07-31 MED ORDER — ALBUTEROL SULFATE HFA 108 (90 BASE) MCG/ACT IN AERS: INHALATION_SPRAY | RESPIRATORY_TRACT | 1 refills | Status: DC

## 2019-07-31 MED ORDER — BECLOMETHASONE DIPROP HFA 40 MCG/ACT IN AERB: 2.0000 | INHALATION_SPRAY | Freq: Two times a day (BID) | RESPIRATORY_TRACT | 5 refills | Status: DC

## 2019-07-31 NOTE — Progress Notes (Signed)
Follow-up Note  RE: Jocelyn Kaufman MRN: [id number] DOB: [birth year] Date of Office Visit: 07/31/2019   History of present illness: Jocelyn Kaufman is a 63 y.o. female presenting today for sick visit for asthma exacerbation.  She has history of mild intermittent asthma and allergic rhinitis.  She was last seen in the office on July 04, 2018 by our nurse practitioner, Gareth Morgan.   She reports over the past month she has had coughing, wheezing, shortness of breath and occasional chest tightness.  She has needed to use her albuterol 2-3 times a week over the past month.  She states prior to this she had been doing well without any major asthma symptoms.  She has not gone to an urgent care or ED for the symptoms.  She has not needed systemic steroids.  She currently only has her albuterol inhaler. She states she is having some itchy eyes and sneezing and is taking Zyrtec daily. She has plans to get her flu vaccine next week. She is on immunotherapy however her last injection was the end of August and since she has been having asthma symptoms she has not been able to receive her monthly injection.  Since her last visit she states she did fall on 07/06/2018 and broke her right humerus and required surgery including plates and screws to repair the fracture.  She states that she had a TIA but she is not sure if that was the cause of her fall however since she has had irregular heartbeat.  Review of systems: Review of Systems  Constitutional: Negative for chills, fever and malaise/fatigue.  HENT: Negative for congestion, ear discharge, nosebleeds, sinus pain and sore throat.   Eyes: Negative for pain, discharge and redness.  Respiratory: Positive for cough, shortness of breath and wheezing. Negative for hemoptysis and sputum production.   Cardiovascular: Negative for chest pain.  Gastrointestinal: Negative.   Musculoskeletal: Negative.   Skin: Negative for itching and rash.  Neurological: Negative for  headaches.    All other systems negative unless noted above in HPI  Past medical/social/surgical/family history have been reviewed and are unchanged unless specifically indicated below.  No changes  Medication List: Current Outpatient Medications  Medication Sig Dispense Refill  . albuterol (VENTOLIN HFA) 108 (90 Base) MCG/ACT inhaler Inhale two puffs every four to six hours as needed for cough or wheeze. 18 g 1  . amLODipine (NORVASC) 5 MG tablet Take 2.5 mg by mouth daily.     Marland Kitchen aspirin 325 MG tablet Take 1 tablet (325 mg total) by mouth daily. (Patient taking differently: Take 81 mg by mouth daily. ) 30 tablet 0  . atorvastatin (LIPITOR) 40 MG tablet Take 1 tablet (40 mg total) by mouth daily at 6 PM. 30 tablet 0  . calcium carbonate (CALCIUM 600) 1500 (600 Ca) MG TABS tablet Calcium 600    . cetirizine (ZYRTEC) 10 MG tablet Take 10 mg by mouth daily.    . Dulaglutide (TRULICITY) 1.5 0000000 SOPN Inject 1.5 mg into the skin once a week.     Marland Kitchen EPINEPHrine (EPIPEN 2-PAK) 0.3 mg/0.3 mL IJ SOAJ injection Use as directed for life-threatening allergic reaction. (Patient taking differently: Inject 0.3 mg into the muscle once. for life-threatening allergic reaction.) 2 Device 1  . escitalopram (LEXAPRO) 20 MG tablet Take 20 mg by mouth daily.    . ferrous sulfate 325 (65 FE) MG tablet Take 365 mg by mouth daily.     . folic acid (FOLVITE) 1  MG tablet Take 1 mg by mouth 2 (two) times daily.     . hydrALAZINE (APRESOLINE) 25 MG tablet Take 25 mg by mouth 2 (two) times daily.    . irbesartan (AVAPRO) 300 MG tablet Take 150 mg by mouth daily.    Marland Kitchen levothyroxine (SYNTHROID) 125 MCG tablet Take 125 mcg by mouth daily before breakfast. BRAND ONLY    . LORazepam (ATIVAN) 0.5 MG tablet Take 0.5 mg by mouth as needed for anxiety.    . Magnesium 500 MG TABS Take 500 mg by mouth daily.     . metaxalone (SKELAXIN) 800 MG tablet Take 400 mg by mouth daily as needed for muscle spasms.     . Methotrexate,  Anti-Rheumatic, (METHOTREXATE, PF, [place]) Inject 0.8 Units into the skin once a week.    . metoprolol succinate (TOPROL-XL) 25 MG 24 hr tablet Take 1 tablet (25 mg total) by mouth daily. 90 tablet 3  . Multiple Vitamins-Minerals (CENTRUM SILVER PO) Take 1 tablet by mouth daily.    . TRAVATAN Z 0.004 % SOLN ophthalmic solution Place 0.004 drops into both eyes daily.    . vitamin B-12 (CYANOCOBALAMIN) 100 MCG tablet Take 100 mcg by mouth daily.     Marland Kitchen VITAMIN D, ERGOCALCIFEROL, PO Take 2,000 mg by mouth 2 (two) times daily.    . beclomethasone (QVAR) 40 MCG/ACT inhaler Inhale 2 puffs into the lungs 2 (two) times daily. 10.6 g 5  . Golimumab (SIMPONI [place]) Inject into the skin.     No current facility-administered medications for this visit.      Known medication allergies: Allergies  Allergen Reactions  . Jardiance [Empagliflozin] Itching and Swelling  . Invokana [Canagliflozin] Other (See Comments)    Urinary incontinence  . Percocet [Oxycodone-Acetaminophen] Nausea And Vomiting  . Prilosec [Omeprazole] Rash  . Rogaine [Minoxidil] Rash  . Victoza [Liraglutide] Itching    Physical examination: Blood pressure (!) 148/76, pulse 78, temperature (!) 97.2 F (36.2 C), temperature source Temporal, resp. rate 18, height 5\' 3"  (1.6 m), weight 243 lb 12.8 oz (110.6 kg), SpO2 96 %.  General: Alert, interactive, in no acute distress. HEENT: PERRLA, TMs pearly gray, turbinates minimally edematous without discharge, post-pharynx non erythematous. Neck: Supple without lymphadenopathy. Lungs: Mildly decreased breath sounds bilaterally without wheezing, rhonchi or rales. {no increased work of breathing. CV: Normal S1, S2 without murmurs. Abdomen: Nondistended, nontender. Skin: Warm and dry, without lesions or rashes. Extremities:  No clubbing, cyanosis or edema. Neuro:   Grossly intact.  Diagnositics/Labs:  Spirometry: FEV1: 1.51L 74%, FVC: 1.73L 67% predicted. Pattern consistent with mild  restriction.  Previous study was normal  Assessment and plan: Mild intermittent asthma with acute exacerbation -she has had persistent symptoms of cough, wheeze, shortness of breath and chest tightness over the past month.  Symptoms are improved with use of albuterol.  She does not have any controller therapies on board at this time and will initiate today.  Due to her history of diabetes she and I would like to try to avoid need of systemic steroids. Allergic rhinitis -continue daily Zyrtec.   1. Start Qvar Redihaler 2 puffs TWICE a day at this time  2.  Continue Albuterol HFA 2 puffs every 4-6 hours if needed  3. Consider nasal saline spray/gel for nasal symptoms  4. OTC antihistamine - Zyrtec 10mg  daily  5. Continue immunotherapy (resume injections once asthma symptoms have improved) and access to [place]  6. Obtain fall flu vaccine next week  7. Return to  clinic in 3-4 months or earlier if problem  I appreciate the opportunity to take part in [place] care. Please do not hesitate to contact me with questions.  Sincerely,   Prudy Feeler, MD Allergy/Immunology [facility]

## 2019-07-31 NOTE — Patient Instructions (Addendum)
  1. Start Qvar Redihaler 2 puffs TWICE a day at this time  2.  Continue Albuterol HFA 2 puffs every 4-6 hours if needed  3. Consider nasal saline spray/gel for nasal symptoms  4. OTC antihistamine - Zyrtec 10mg  daily  5. Continue immunotherapy (resume injections once asthma symptoms have improved) and access to [place]  6. Obtain fall flu vaccine next week  7. Return to clinic in 3-4 months or earlier if problem

## 2019-08-13 ENCOUNTER — Ambulatory Visit (INDEPENDENT_AMBULATORY_CARE_PROVIDER_SITE_OTHER): Payer: BC Managed Care – PPO

## 2019-08-13 DIAGNOSIS — J309 Allergic rhinitis, unspecified: Secondary | ICD-10-CM | POA: Diagnosis not present

## 2019-08-20 ENCOUNTER — Ambulatory Visit (INDEPENDENT_AMBULATORY_CARE_PROVIDER_SITE_OTHER): Payer: BC Managed Care – PPO | Admitting: *Deleted

## 2019-08-20 DIAGNOSIS — J309 Allergic rhinitis, unspecified: Secondary | ICD-10-CM | POA: Diagnosis not present

## 2019-08-26 ENCOUNTER — Other Ambulatory Visit: Payer: Self-pay | Admitting: Internal Medicine

## 2019-09-17 ENCOUNTER — Ambulatory Visit (INDEPENDENT_AMBULATORY_CARE_PROVIDER_SITE_OTHER): Payer: BC Managed Care – PPO

## 2019-09-17 DIAGNOSIS — J309 Allergic rhinitis, unspecified: Secondary | ICD-10-CM

## 2019-10-16 ENCOUNTER — Ambulatory Visit (INDEPENDENT_AMBULATORY_CARE_PROVIDER_SITE_OTHER): Payer: BC Managed Care – PPO | Admitting: *Deleted

## 2019-10-16 DIAGNOSIS — J309 Allergic rhinitis, unspecified: Secondary | ICD-10-CM | POA: Diagnosis not present

## 2019-10-19 ENCOUNTER — Other Ambulatory Visit: Payer: Self-pay

## 2019-10-19 ENCOUNTER — Other Ambulatory Visit: Payer: Self-pay | Admitting: Allergy

## 2019-10-19 DIAGNOSIS — J301 Allergic rhinitis due to pollen: Secondary | ICD-10-CM | POA: Diagnosis not present

## 2019-10-19 MED ORDER — EPINEPHRINE 0.3 MG/0.3ML IJ SOAJ: INTRAMUSCULAR | 1 refills | Status: DC

## 2019-10-19 NOTE — Telephone Encounter (Signed)
Patient made aware that Epi-Pens were sent to [facility] on [address]. Patient verbalized understanding.

## 2019-10-19 NOTE — Telephone Encounter (Signed)
Patient is requesting at least 2 Auvi-Q Epi Pens. She said she got it filled through [facility]. But, she said she also uses [facility] on [address].

## 2019-10-19 NOTE — Progress Notes (Signed)
Vials exp 10-18-20

## 2019-10-30 HISTORY — PX: OTHER SURGICAL HISTORY: SHX169

## 2019-11-13 ENCOUNTER — Ambulatory Visit (INDEPENDENT_AMBULATORY_CARE_PROVIDER_SITE_OTHER): Payer: BC Managed Care – PPO | Admitting: *Deleted

## 2019-11-13 DIAGNOSIS — J309 Allergic rhinitis, unspecified: Secondary | ICD-10-CM

## 2019-11-26 ENCOUNTER — Other Ambulatory Visit: Payer: Self-pay | Admitting: Internal Medicine

## 2019-12-03 ENCOUNTER — Other Ambulatory Visit: Payer: Self-pay

## 2019-12-03 ENCOUNTER — Ambulatory Visit: Payer: BC Managed Care – PPO | Admitting: Allergy

## 2019-12-03 ENCOUNTER — Encounter: Payer: Self-pay | Admitting: Allergy

## 2019-12-03 VITALS — BP 150/80 | HR 95 | Temp 97.8°F | Resp 18 | Ht 63.0 in

## 2019-12-03 DIAGNOSIS — J4520 Mild intermittent asthma, uncomplicated: Secondary | ICD-10-CM

## 2019-12-03 DIAGNOSIS — J3089 Other allergic rhinitis: Secondary | ICD-10-CM | POA: Diagnosis not present

## 2019-12-03 NOTE — Progress Notes (Signed)
Follow-up Note  RE: Jocelyn Kaufman MRN: [id number] DOB: February 21, 1956 Date of Office Visit: 12/03/2019   History of present illness: Jocelyn Kaufman is a 64 y.o. female presenting today for follow-up of allergic rhinitis and asthma.  She was last seen in the office on 07/31/2019 by myself.  Since her last visit she states that she is doing better from a respiratory standpoint.  She is using the Qvar as needed and states she has used it 3 times in the past month.  She states she will reach forward if she feels a tickle sensation in her chest that it does relieve.  She has also have access to an albuterol inhaler and will use this instead of the Qvar if she is having more deeper respiratory symptoms for quick relief.  She denies any nighttime awakenings.  She has not required any ED or urgent care visits or any systemic steroid needs. She is on allergen immunotherapy at maintenance dosing.  She states she does have increase in allergy symptoms about 6 days before she is due for her monthly injection.  She does feel that the immunotherapy has been helpful but at this time is not ready to try to stop.  She does take Zyrtec on a daily basis.  She has had her flu vaccine this season. We did discuss benefits and risk associated with the available Covid vaccines at this time.   Review of systems: Review of Systems  Constitutional: Negative.   HENT: Negative.   Eyes: Negative.   Respiratory: Negative.   Cardiovascular: Negative.   Gastrointestinal: Negative.   Musculoskeletal: Negative.   Skin: Negative.   Neurological: Negative.     All other systems negative unless noted above in HPI  Past medical/social/surgical/family history have been reviewed and are unchanged unless specifically indicated below.  No changes  Medication List: Current Outpatient Medications  Medication Sig Dispense Refill  . albuterol (VENTOLIN HFA) 108 (90 Base) MCG/ACT inhaler Inhale two puffs every four to six hours as  needed for cough or wheeze. 18 g 1  . amLODipine (NORVASC) 5 MG tablet Take 2.5 mg by mouth daily.     Marland Kitchen aspirin 325 MG tablet Take 1 tablet (325 mg total) by mouth daily. (Patient taking differently: Take 81 mg by mouth daily. ) 30 tablet 0  . atorvastatin (LIPITOR) 40 MG tablet Take 1 tablet (40 mg total) by mouth daily at 6 PM. 30 tablet 0  . beclomethasone (QVAR) 40 MCG/ACT inhaler Inhale 2 puffs into the lungs 2 (two) times daily. (Patient taking differently: Inhale 2 puffs into the lungs 2 (two) times daily as needed. ) 10.6 g 5  . calcium carbonate (CALCIUM 600) 1500 (600 Ca) MG TABS tablet Calcium 600    . cetirizine (ZYRTEC) 10 MG tablet Take 10 mg by mouth daily.    . Dulaglutide (TRULICITY) 1.5 0000000 SOPN Inject 1.5 mg into the skin once a week.     Marland Kitchen EPINEPHrine (EPIPEN 2-PAK) 0.3 mg/0.3 mL IJ SOAJ injection Use as directed for life-threatening allergic reaction. 2 each 1  . escitalopram (LEXAPRO) 20 MG tablet Take 20 mg by mouth daily.    . ferrous sulfate 325 (65 FE) MG tablet Take 365 mg by mouth daily.     . folic acid (FOLVITE) 1 MG tablet Take 1 mg by mouth 2 (two) times daily.     . Golimumab (SIMPONI Foley) Inject into the skin.    . hydrALAZINE (APRESOLINE) 25 MG tablet Take  25 mg by mouth 2 (two) times daily.    . irbesartan (AVAPRO) 300 MG tablet Take 150 mg by mouth daily.    Marland Kitchen levothyroxine (SYNTHROID) 125 MCG tablet Take 125 mcg by mouth daily before breakfast. BRAND ONLY    . LORazepam (ATIVAN) 0.5 MG tablet Take 0.5 mg by mouth as needed for anxiety.    . Magnesium 500 MG TABS Take 500 mg by mouth daily.     . metaxalone (SKELAXIN) 800 MG tablet Take 400 mg by mouth daily as needed for muscle spasms.     . methotrexate 250 MG/10ML injection methotrexate sodium 25 mg/mL injection solution  INJECT 0.8 MLS ONCE A WEEK AS DIRECTED    . Methotrexate, Anti-Rheumatic, (METHOTREXATE, PF, [place]) Inject 0.8 Units into the skin once a week.    . metoprolol succinate (TOPROL-XL) 25  MG 24 hr tablet Take 1 tablet (25 mg total) by mouth daily. Pt needs to make appt with provider for further refills - 1st attempt 60 tablet 0  . Multiple Vitamins-Minerals (CENTRUM SILVER PO) Take 1 tablet by mouth daily.    . TRAVATAN Z 0.004 % SOLN ophthalmic solution Place 0.004 drops into both eyes daily.    . vitamin B-12 (CYANOCOBALAMIN) 100 MCG tablet Take 100 mcg by mouth daily.     Marland Kitchen VITAMIN D, ERGOCALCIFEROL, PO Take 2,000 mg by mouth 2 (two) times daily.     No current facility-administered medications for this visit.     Known medication allergies: Allergies  Allergen Reactions  . Jardiance [Empagliflozin] Itching and Swelling  . Invokana [Canagliflozin] Other (See Comments)    Urinary incontinence  . Percocet [Oxycodone-Acetaminophen] Nausea And Vomiting  . Prilosec [Omeprazole] Rash  . Rogaine [Minoxidil] Rash  . Victoza [Liraglutide] Itching     Physical examination: Blood pressure (!) 150/80, pulse 95, temperature 97.8 F (36.6 C), temperature source Temporal, resp. rate 18, height 5\' 3"  (1.6 m), SpO2 96 %.  General: Alert, interactive, in no acute distress. HEENT: PERRLA, TMs pearly gray, turbinates non-edematous without discharge, post-pharynx non erythematous. Neck: Supple without lymphadenopathy. Lungs: Clear to auscultation without wheezing, rhonchi or rales. {no increased work of breathing. CV: Normal S1, S2 without murmurs. Abdomen: Nondistended, nontender. Skin: Warm and dry, without lesions or rashes. Extremities:  No clubbing, cyanosis or edema. Neuro:   Grossly intact.  Diagnositics/Labs:  Spirometry: FEV1: 1.64L 85%, FVC: 1.91L 77%, ratio consistent with nonbstructive pattern for age/demographics  Assessment and plan:  Mild intermittent asthma -improved with ascending use of Qvar which we will continue.  We discussed today if symptoms become more frequent and not meeting the below goals then Qvar should become a daily controller  medication. Allergic rhinitis -continue daily Zyrtec.  Discussed allergen immunotherapy and at this time we are not in a place to stop therapy as she does have increased symptoms the week prior to her next injection.   1. Continue Qvar Redihaler 2 puffs TWICE a day as needed.  If you are not meeting the below goals let me know and take Qvar daily at that time for improved control.    2.  Continue Albuterol HFA 2 puffs every 4-6 hours if needed  3. Consider nasal saline spray/gel for nasal symptoms  4. OTC antihistamine - Zyrtec 10mg  daily  5. Continue immunotherapy monthly and access to [facility]  Respiratory control goals:   Full participation in all desired activities (may need albuterol before activity)  Albuterol use two time or less a week on average (not  counting use with activity)  Cough interfering with sleep two time or less a month  Oral steroids no more than once a year  No hospitalizations   Discussed Covid vaccine options today as well as the benefits and risks.  Recommend receiving Covid vaccine once available to her.  6. Return to clinic in 4-6 months or earlier if problem  I appreciate the opportunity to take part in [place] care. Please do not hesitate to contact me with questions.  Sincerely,   Prudy Feeler, MD Allergy/Immunology [facility] [place] [facility] [place]

## 2019-12-03 NOTE — Patient Instructions (Addendum)
   1. Continue Qvar Redihaler 2 puffs TWICE a day as needed.  If you are not meeting the below goals let me know and take Qvar daily at that time for improved control.    2.  Continue Albuterol HFA 2 puffs every 4-6 hours if needed  3. Consider nasal saline spray/gel for nasal symptoms  4. OTC antihistamine - Zyrtec 10mg  daily  5. Continue immunotherapy monthly and access to [facility]  Respiratory control goals:   Full participation in all desired activities (may need albuterol before activity)  Albuterol use two time or less a week on average (not counting use with activity)  Cough interfering with sleep two time or less a month  Oral steroids no more than once a year  No hospitalizations   6. Return to clinic in 4-6 months or earlier if problem

## 2019-12-10 ENCOUNTER — Ambulatory Visit (INDEPENDENT_AMBULATORY_CARE_PROVIDER_SITE_OTHER): Payer: BC Managed Care – PPO

## 2019-12-10 DIAGNOSIS — J309 Allergic rhinitis, unspecified: Secondary | ICD-10-CM

## 2019-12-16 ENCOUNTER — Ambulatory Visit (INDEPENDENT_AMBULATORY_CARE_PROVIDER_SITE_OTHER): Payer: BC Managed Care – PPO

## 2019-12-16 DIAGNOSIS — J309 Allergic rhinitis, unspecified: Secondary | ICD-10-CM

## 2019-12-22 ENCOUNTER — Ambulatory Visit (INDEPENDENT_AMBULATORY_CARE_PROVIDER_SITE_OTHER): Payer: BC Managed Care – PPO

## 2019-12-22 DIAGNOSIS — J309 Allergic rhinitis, unspecified: Secondary | ICD-10-CM

## 2019-12-31 ENCOUNTER — Ambulatory Visit (INDEPENDENT_AMBULATORY_CARE_PROVIDER_SITE_OTHER): Payer: BC Managed Care – PPO

## 2019-12-31 DIAGNOSIS — J309 Allergic rhinitis, unspecified: Secondary | ICD-10-CM

## 2020-01-06 ENCOUNTER — Ambulatory Visit (INDEPENDENT_AMBULATORY_CARE_PROVIDER_SITE_OTHER): Payer: BC Managed Care – PPO | Admitting: *Deleted

## 2020-01-06 DIAGNOSIS — J309 Allergic rhinitis, unspecified: Secondary | ICD-10-CM

## 2020-01-14 ENCOUNTER — Other Ambulatory Visit: Payer: Self-pay | Admitting: *Deleted

## 2020-01-14 ENCOUNTER — Telehealth: Payer: Self-pay | Admitting: Hematology and Oncology

## 2020-01-14 DIAGNOSIS — D649 Anemia, unspecified: Secondary | ICD-10-CM

## 2020-01-14 NOTE — Telephone Encounter (Signed)
Scheduled appt per 3/18 sch message - pt is aware of appt date and time

## 2020-01-14 NOTE — Progress Notes (Signed)
Referral received from Dr. Shelia Media for pt to be seen by Dr. Lindi Adie for anemia evaluation.  Pt last seen in 2019 by MD.  Scheduling message sent.

## 2020-01-19 ENCOUNTER — Inpatient Hospital Stay: Payer: BC Managed Care – PPO | Attending: Hematology and Oncology

## 2020-01-19 ENCOUNTER — Other Ambulatory Visit: Payer: Self-pay

## 2020-01-19 DIAGNOSIS — D649 Anemia, unspecified: Secondary | ICD-10-CM

## 2020-01-19 DIAGNOSIS — R5383 Other fatigue: Secondary | ICD-10-CM | POA: Insufficient documentation

## 2020-01-19 DIAGNOSIS — E039 Hypothyroidism, unspecified: Secondary | ICD-10-CM | POA: Insufficient documentation

## 2020-01-19 DIAGNOSIS — N189 Chronic kidney disease, unspecified: Secondary | ICD-10-CM | POA: Diagnosis not present

## 2020-01-19 DIAGNOSIS — E1122 Type 2 diabetes mellitus with diabetic chronic kidney disease: Secondary | ICD-10-CM | POA: Insufficient documentation

## 2020-01-19 LAB — CBC WITH DIFFERENTIAL (CANCER CENTER ONLY)
Abs Immature Granulocytes: 0.02 10*3/uL (ref 0.00–0.07)
Basophils Absolute: 0 10*3/uL (ref 0.0–0.1)
Basophils Relative: 0 %
Eosinophils Absolute: 0.1 10*3/uL (ref 0.0–0.5)
Eosinophils Relative: 2 %
HCT: 34.2 % — ABNORMAL LOW (ref 36.0–46.0)
Hemoglobin: 10.3 g/dL — ABNORMAL LOW (ref 12.0–15.0)
Immature Granulocytes: 0 %
Lymphocytes Relative: 24 %
Lymphs Abs: 1.7 10*3/uL (ref 0.7–4.0)
MCH: 26.8 pg (ref 26.0–34.0)
MCHC: 30.1 g/dL (ref 30.0–36.0)
MCV: 89.1 fL (ref 80.0–100.0)
Monocytes Absolute: 0.4 10*3/uL (ref 0.1–1.0)
Monocytes Relative: 5 %
Neutro Abs: 4.9 10*3/uL (ref 1.7–7.7)
Neutrophils Relative %: 69 %
Platelet Count: 195 10*3/uL (ref 150–400)
RBC: 3.84 MIL/uL — ABNORMAL LOW (ref 3.87–5.11)
RDW: 14.2 % (ref 11.5–15.5)
WBC Count: 7.2 10*3/uL (ref 4.0–10.5)
nRBC: 0 % (ref 0.0–0.2)

## 2020-01-19 LAB — FERRITIN: Ferritin: 141 ng/mL (ref 11–307)

## 2020-01-19 LAB — IRON AND TIBC
Iron: 67 ug/dL (ref 41–142)
Saturation Ratios: 24 % (ref 21–57)
TIBC: 279 ug/dL (ref 236–444)
UIBC: 211 ug/dL (ref 120–384)

## 2020-01-20 NOTE — Progress Notes (Signed)
Patient Care Team: Deland Pretty, MD as PCP - General (Internal Medicine)  DIAGNOSIS:    ICD-10-CM   1. Normochromic normocytic anemia  D64.9     CHIEF COMPLIANT: Follow-up of normocytic anemia  INTERVAL HISTORY: Jocelyn Kaufman is a 63 y.o. with above-mentioned history of normocytic anemia. Labs on 01/19/20 showed: Hg 10.3, HCT 34.2, iron saturation 24%, ferritin 141. She presents to the clinic today for follow-up.  She has extensive chronic medical problems including diabetes and chronic kidney disease along with hypothyroidism as well as adrenal disorder.  She continues to have severe fatigue with minimal exertion and she has been frustrated that her symptoms have not been improving.  Blood work done by multiple providers has shown chronic anemia and she was referred back to [place] to discuss the management of her chronic anemia.  Patient tells me that she has been taking oral iron therapy once or twice a day and in spite of that her hemoglobin has been fairly steady and not improving.      ALLERGIES:  is allergic to jardiance [empagliflozin]; invokana [canagliflozin]; percocet [oxycodone-acetaminophen]; prilosec [omeprazole]; rogaine [minoxidil]; and victoza [liraglutide].  MEDICATIONS:  Current Outpatient Medications  Medication Sig Dispense Refill  . albuterol (VENTOLIN HFA) 108 (90 Base) MCG/ACT inhaler Inhale two puffs every four to six hours as needed for cough or wheeze. 18 g 1  . amLODipine (NORVASC) 5 MG tablet Take 2.5 mg by mouth daily.     Marland Kitchen aspirin 325 MG tablet Take 1 tablet (325 mg total) by mouth daily. (Patient taking differently: Take 81 mg by mouth daily. ) 30 tablet 0  . atorvastatin (LIPITOR) 40 MG tablet Take 1 tablet (40 mg total) by mouth daily at 6 PM. 30 tablet 0  . beclomethasone (QVAR) 40 MCG/ACT inhaler Inhale 2 puffs into the lungs 2 (two) times daily. (Patient taking differently: Inhale 2 puffs into the lungs 2 (two) times daily as needed. ) 10.6 g 5  . cetirizine  (ZYRTEC) 10 MG tablet Take 10 mg by mouth daily.    . Dulaglutide (TRULICITY) 1.5 EX/5.2WU SOPN Inject 3 mg into the skin once a week.     Marland Kitchen EPINEPHrine (EPIPEN 2-PAK) 0.3 mg/0.3 mL IJ SOAJ injection Use as directed for life-threatening allergic reaction. 2 each 1  . escitalopram (LEXAPRO) 20 MG tablet Take 20 mg by mouth daily.    . ferrous sulfate 325 (65 FE) MG tablet Take 365 mg by mouth daily.     . folic acid (FOLVITE) 1 MG tablet Take 1 mg by mouth 2 (two) times daily.     . hydrALAZINE (APRESOLINE) 25 MG tablet Take 25 mg by mouth 3 (three) times daily.     . irbesartan (AVAPRO) 300 MG tablet Take 300 mg by mouth daily.     Marland Kitchen levothyroxine (SYNTHROID) 125 MCG tablet Take 125 mcg by mouth daily before breakfast. BRAND ONLY    . LORazepam (ATIVAN) 0.5 MG tablet Take 0.5 mg by mouth as needed for anxiety.    . Methotrexate, Anti-Rheumatic, (METHOTREXATE, PF, [place]) Inject 0.8 Units into the skin once a week.    . metoprolol succinate (TOPROL-XL) 25 MG 24 hr tablet Take 1 tablet (25 mg total) by mouth daily. Pt needs to make appt with provider for further refills - 1st attempt 60 tablet 0  . Multiple Vitamins-Minerals (CENTRUM SILVER PO) Take 1 tablet by mouth daily.    . TRAVATAN Z 0.004 % SOLN ophthalmic solution Place 0.004 drops into both  eyes daily.    . vitamin B-12 (CYANOCOBALAMIN) 100 MCG tablet Take 100 mcg by mouth daily.     Marland Kitchen VITAMIN D, ERGOCALCIFEROL, PO Take 2,000 mg by mouth 2 (two) times daily.    . calcium carbonate (CALCIUM 600) 1500 (600 Ca) MG TABS tablet Calcium 600    . Golimumab (SIMPONI [place]) Inject into the skin.    . Magnesium 500 MG TABS Take 500 mg by mouth daily.     . metaxalone (SKELAXIN) 800 MG tablet Take 400 mg by mouth daily as needed for muscle spasms.     . methotrexate 250 MG/10ML injection methotrexate sodium 25 mg/mL injection solution  INJECT 0.8 MLS ONCE A WEEK AS DIRECTED     No current facility-administered medications for this visit.    PHYSICAL  EXAMINATION: ECOG PERFORMANCE STATUS: 1 - Symptomatic but completely ambulatory  Vitals:   01/21/20 0947  BP: 139/66  Pulse: 78  Resp: 18  Temp: 98.5 F (36.9 C)  SpO2: 100%   Filed Weights   01/21/20 0947  Weight: 241 lb 9.6 oz (109.6 kg)    LABORATORY DATA:  I have reviewed the data as listed CMP Latest Ref Rng & Units 10/07/2018 07/12/2018 07/11/2018  Glucose 65 - 99 mg/dL 123(H) 128(H) 143(H)  BUN 8 - 27 mg/dL 26 47(H) 43(H)  Creatinine 0.57 - 1.00 mg/dL 1.13(H) 1.35(H) 1.60(H)  Sodium 134 - 144 mmol/L 140 136 136  Potassium 3.5 - 5.2 mmol/L 5.1 4.6 4.3  Chloride 96 - 106 mmol/L 105 103 103  CO2 20 - 29 mmol/L '21 23 22  ' Calcium 8.7 - 10.3 mg/dL 9.4 8.7(L) 8.8(L)  Total Protein 6.5 - 8.1 g/dL - - -  Total Bilirubin 0.3 - 1.2 mg/dL - - -  Alkaline Phos 38 - 126 U/L - - -  AST 15 - 41 U/L - - -  ALT 0 - 44 U/L - - -    Lab Results  Component Value Date   WBC 7.2 01/19/2020   HGB 10.3 (L) 01/19/2020   HCT 34.2 (L) 01/19/2020   MCV 89.1 01/19/2020   PLT 195 01/19/2020   NEUTROABS 4.9 01/19/2020    ASSESSMENT & PLAN:  Normochromic normocytic anemia Patient has history of hypothyroidism as well as possible adrenal dysfunction issues for which she goes to [facility]  Previous blood work review: 1.  Erythropoietin: 24.2 2. SPEP: No M protein 3.  Haptoglobin: 146, LDH 281, absolute reticulocyte count 96.4, direct Coombs test negative 4.  B12 750  5.  Folic acid 46.2 6.  Ferritin 88, iron saturation 25 7. Hemoglobin 9.6 with MCV 89  Etiology: Anemia of chronic disease secondary to chronic kidney disease, hypothyroidism and adrenal issues    Lab review 01/19/2020: Hemoglobin 10.3, MCV 89.1, iron saturation 24% Treatment plan: 1.  Obtain erythropoietin level.  If the EPO level is less than 50 then we will consider giving her growth factor injections to keep hemoglobin greater than 10. I discussed with her that insurance would not authorize for erythropoietin stimulating  agents for hemoglobin above 10 g. If the erythropoietin level is elevated then we will consider doing a bone marrow biopsy to evaluate for stem cell dysfunction. 2.  Severe chronic fatigue: I discussed with her that the hemoglobin is less likely to be the cause of her severe fatigue.  She has extensive other illnesses that could explain the cause of her fatigue.  We will call her with the results of the erythropoietin level and plan  the subsequent steps.    No orders of the defined types were placed in this encounter.  The patient has a good understanding of the overall plan. she agrees with it. she will call with any problems that may develop before the next visit here.  Total time spent: 30 mins including face to face time and time spent for planning, charting and coordination of care  Nicholas Lose, MD 01/21/2020  I, Cloyde Reams Dorshimer, am acting as scribe for Dr. Nicholas Lose.  I have reviewed the above documentation for accuracy and completeness, and I agree with the above.

## 2020-01-21 ENCOUNTER — Other Ambulatory Visit: Payer: Self-pay

## 2020-01-21 ENCOUNTER — Inpatient Hospital Stay: Payer: BC Managed Care – PPO

## 2020-01-21 ENCOUNTER — Inpatient Hospital Stay: Payer: BC Managed Care – PPO | Admitting: Hematology and Oncology

## 2020-01-21 ENCOUNTER — Encounter: Payer: Self-pay | Admitting: Adult Health

## 2020-01-21 DIAGNOSIS — D649 Anemia, unspecified: Secondary | ICD-10-CM | POA: Diagnosis not present

## 2020-01-21 NOTE — Progress Notes (Signed)
Went to see patient on behalf of Dr. Lindi Adie, as she was delayed getting through registration to her appointment with him and he had an appointment.  Patient was upset that her appointment was changed at the last minute and she was not aware.  She asked me for my years of experience and my degree.  I apologized to her and let her know that she could certainly wait until he returned.  She noted that it was ok.  I reviewed with her that her hemoglobin was 10.3 which is stable.  Per Dr. Geralyn Flash recent note we are monitoring her hemoglobin and a bone marrow has been discussed with her in the past.  Alaina noted to me that monitoring is not sufficient and she was never informed about a bone marrow.  That her PCP, gynecologist, and other specialists have told her that her hemoglobin is too low, and that it needs to be normal.  Patient had aggressive tone, and was obviously unhappy.  I let [place] know that I was leaving the room to call Dr. Lindi Adie to find out when he plans to return so that she could see him.    Dr. Lindi Adie notified, and into room to see patient, as he had just returned.     Wilber Bihari, NP

## 2020-01-21 NOTE — Assessment & Plan Note (Signed)
Patient has history of hypothyroidism as well as possible adrenal dysfunction issues for which she goes to [facility]  Previous blood work review: 1.  Erythropoietin: 24.2 2. SPEP: No M protein 3.  Haptoglobin: 146, LDH 281, absolute reticulocyte count 96.4, direct Coombs test negative 4.  B12 750  5.  Folic acid 14.3 6.  Ferritin 88, iron saturation 25 7. Hemoglobin 9.6 with MCV 89  Etiology: Anemia of chronic disease secondary to hypothyroidism and adrenal issues.  Also could be methotrexate related. Bone marrow biopsy was previously offered but she elected to watch and wait.  Lab review 01/19/2020: Hemoglobin 10.3, MCV 89.1, iron saturation 24% I discussed with her that having hemoglobin greater than 10 ensures that she has adequate amount of blood.  There is no indication to start her on erythropoietin replacement therapy. This can be watched and monitored. I instructed her to get labs every 6 months and I will see her back in a year.

## 2020-01-22 ENCOUNTER — Other Ambulatory Visit: Payer: Self-pay

## 2020-01-22 DIAGNOSIS — D649 Anemia, unspecified: Secondary | ICD-10-CM

## 2020-01-22 LAB — ERYTHROPOIETIN: Erythropoietin: 17.7 m[IU]/mL (ref 2.6–18.5)

## 2020-01-30 ENCOUNTER — Other Ambulatory Visit: Payer: Self-pay | Admitting: Internal Medicine

## 2020-02-03 ENCOUNTER — Ambulatory Visit (INDEPENDENT_AMBULATORY_CARE_PROVIDER_SITE_OTHER): Payer: BC Managed Care – PPO | Admitting: *Deleted

## 2020-02-03 ENCOUNTER — Other Ambulatory Visit: Payer: Self-pay | Admitting: *Deleted

## 2020-02-03 DIAGNOSIS — J309 Allergic rhinitis, unspecified: Secondary | ICD-10-CM | POA: Diagnosis not present

## 2020-02-03 MED ORDER — METOPROLOL SUCCINATE ER 25 MG PO TB24: 25.0000 mg | ORAL_TABLET | Freq: Every day | ORAL | 0 refills | Status: DC

## 2020-02-03 NOTE — Telephone Encounter (Signed)
*  STAT* If patient is at the pharmacy, call can be transferred to refill team.   1. Which medications need to be refilled? (please list name of each medication and dose if known)  Metoprolol- pt said pharmacist said they had sent refills here 3 times  2. Which pharmacy/location (including street and city if local pharmacy) is medication to be sent to? [place]  3. Do they need a 30 day or 90 day supply? 90 days siupply

## 2020-02-03 NOTE — Telephone Encounter (Signed)
Rx has been sent to the pharmacy electronically. ° °

## 2020-02-08 ENCOUNTER — Other Ambulatory Visit: Payer: Self-pay

## 2020-02-15 ENCOUNTER — Encounter: Payer: Self-pay | Admitting: Internal Medicine

## 2020-02-15 ENCOUNTER — Ambulatory Visit: Payer: BC Managed Care – PPO | Admitting: Internal Medicine

## 2020-02-15 ENCOUNTER — Other Ambulatory Visit: Payer: Self-pay

## 2020-02-15 VITALS — BP 148/72 | HR 72 | Temp 97.2°F | Ht 64.5 in | Wt 245.0 lb

## 2020-02-15 DIAGNOSIS — I3139 Other pericardial effusion (noninflammatory): Secondary | ICD-10-CM

## 2020-02-15 DIAGNOSIS — I10 Essential (primary) hypertension: Secondary | ICD-10-CM

## 2020-02-15 DIAGNOSIS — I313 Pericardial effusion (noninflammatory): Secondary | ICD-10-CM

## 2020-02-15 DIAGNOSIS — I472 Ventricular tachycardia: Secondary | ICD-10-CM | POA: Diagnosis not present

## 2020-02-15 DIAGNOSIS — I4729 Other ventricular tachycardia: Secondary | ICD-10-CM

## 2020-02-15 MED ORDER — METOPROLOL SUCCINATE ER 25 MG PO TB24: 25.0000 mg | ORAL_TABLET | Freq: Every day | ORAL | 3 refills | Status: DC

## 2020-02-15 NOTE — Patient Instructions (Signed)

## 2020-02-15 NOTE — Progress Notes (Signed)
OFFICE CONSULT NOTE  Chief Complaint:  Follow-up NSVT  Primary Care Physician: Deland Pretty, MD  HPI:  Jocelyn Kaufman is a 64 y.o. female who is being seen today for the evaluation of intermittent chest pressure at the request of Deland Pretty, MD. Jocelyn Kaufman is a 64 year old female with a history of type 2 diabetes, depression, hyperlipidemia, hypertension, left ulnar neuropathy, possible obstructive sleep apnea, and recent extensive workup at the [facility] for possible Cushing's disease. Per their report it does not seem that she has this although she was noted to have bilateral adrenal hyperplasia. I first met Jocelyn Kaufman in 2012 which time she was hospitalized for chest pain and underwent a heart catheterization after an abnormal nuclear stress test, which I performed demonstrating normal coronary arteries. As part of her workup at the [facility] she underwent an echocardiogram. I reviewed the echocardiogram report but did not have the images to directly review. This demonstrated an LVEF of 55%, no regional wall motion abnormalities, mild left atrial enlargement, and a small, hemodynamically significant pericardial effusion. RVSP was normal at 32 mmHg. The study was performed on 02/11/2017. She says that over the past week or 2 she's had some more intermittent chest pressure. This is substernal and does not radiate. It's not worse with exertion or relieved by rest. The etiology of her pericardial effusion is not clear however NIH suggested a workup for immune disorders. When I asked her about family history, she was somewhat unclear about whether autoimmune disorders were in her family. She also inquired today for me to fill out paperwork supporting disability. She is a 6 grade teacher. I reiterated that I did not believe that at this point she hasn't developed disabling cardiovascular condition, although she has multiple medical problems which are currently being addressed by her primary  care provider.  04/30/2017  Jocelyn Kaufman returns today for follow-up. As per our previous plan she underwent a repeat echocardiogram to evaluate her small pericardial effusion. The study on 03/28/2017 showed an EF of 28-63%, grade 2 diastolic dysfunction and a small to moderate sized pericardial effusion without tamponade features. We attempted to contact the patient on 03/29/2017 and 04/01/2017 but received a smart call blocker response. She was then notified of results and a copy of the echo report was left per her request at the front desk (see notes attached to the echo dated 03/28/2017). Based on those findings and the acute change in size of the pericardial effusion, I recommended a repeat echo in one month to rule out any worsening pericardial effusion. She expressed interest in following up after that limited echo. A limited echo to evaluate the pericardial effusion was performed yesterday on 04/29/2017. I personally reviewed the study and EF has improved to 60-65%, again grade 2 diastolic dysfunction is noted with a much smaller pericardial effusion. There was no evidence of tamponade physiology. When we initially spoke she reported that she had not received any information about the studies and was unclear why she needed to echoes. She seems somewhat frustrated and overwhelmed with the myriad of doctors that she's been seeing. Recently she was seen by Dr. Kathlene November, a rheumatologist, who diagnosed her with rheumatoid arthritis. She's been started on methotrexate. She says that she has not noticed a significant difference in swelling of her hands, however it may be improving her pericardial effusion. She does not report any significant persistent chest discomfort or shortness of breath, rather occasional episodes.  10/17/2017  Jocelyn Kaufman returns  today for follow-up.  It has been 6 months since I last saw her.  Her recent echo in July 2018 showed a slightly higher EF of 60-65% with a small pericardial effusion  which was improved.  Overall she feels much better.  She has started on a medication called Simponi for her RA in addition to methotrexate.  She is only had one dose but she responded well to it.  She denies any further chest heaviness, pressure pain or shortness of breath.  06/27/2018  Jocelyn Kaufman is seen today in follow-up.  She underwent a limited echo on 05/02/2018 which showed a small, mostly posterior pericardial effusion which is essentially unchanged.  LVEF was normal at 60 to 65%.  Her echoes have been stable compared to studies back in 2018 and the degree of effusion is much less than was originally identified when she underwent evaluation at the [facility].  This is likely related to RA.  Subsequently she has been diagnosed with fibromyalgia.  She is also on insulin and Jardiance for control of blood sugars.  In general she says she feels much better than she had in the past.  09/05/2018  Jocelyn Kaufman is seen today in follow-up.  Recently I had seen her and she had a stable trivial pericardial effusion.  Based on no subsequent changes I felt follow-up could be as necessary.  Subsequently she was hospitalized after an episode of possible TIA including speech difficulties and associated fall, sustaining a humeral fracture that required open reduction and internal fixation.  Post discharge she was placed on a monitor which did show brief episodes of nonsustained VT and PVCs.  She is subsequently referred back for evaluation of nonsustained VT.  He denies any chest pain.  It was noted that she had normal coronary arteries in 2012 by heart catheterization.  11/13/2018  Jocelyn Kaufman returns today for follow-up.  She believes that the beta-blocker has suppressed her NSVT and PVCs.  She was ordered for a CT coronary angiogram however due to concerns by her nephrologist about dye contrast, she was switched to a [employer].  Fortunately this Lexiscan Myoview was negative for ischemia and showed normal LV  function.  02/15/2020  Jocelyn Kaufman is seen today in follow-up.  This is for ongoing management and evaluation of PVCs and nonsustained VT.  She says this happens fairly infrequently.  She denies any recurrent symptoms associated with her previous pericarditis.  Today she is struggling with some pain and weakness in her left hand for which she was wearing a compression sleeve.  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[place])    Depression    Diabetes mellitus without complication ([place])    Hyperlipidemia    Hypertension    Hypothyroidism    Meralgia paresthetica of right side 01/10/2017   Migraine    Pericardial effusion    Ulnar neuropathy at wrist, 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   Urticaria     Past Surgical History:  Procedure Laterality Date   ADENOIDECTOMY     APPENDECTOMY     CYST REMOVAL LEG Right    Removed from right thigh joint   ORIF HUMERUS FRACTURE Right 07/09/2018   Procedure: OPEN REDUCTION INTERNAL FIXATION (ORIF) HUMERAL SHAFT FRACTURE;  Surgeon: Hiram Gash, MD;  Location: [facility];  Service: Orthopedics;  Laterality: Right;   TONSILLECTOMY     TOTAL VAGINAL HYSTERECTOMY  UMBILICAL HERNIA REPAIR      FAMHx:  Family History  Problem Relation Age of Onset   Hypertension Mother    Stroke Mother    Kidney failure Mother    Heart disease Mother        heart murmur   Glaucoma Mother    Stroke Father    COPD Father    Cancer - Prostate Father    Colon cancer Father     SOCHx:   reports that she has never smoked. She has never used smokeless tobacco. She reports current alcohol use. She reports that she does not use drugs.  ALLERGIES:  Allergies  Allergen Reactions   Jardiance [Empagliflozin] Itching and Swelling   Latex    Invokana [Canagliflozin] Other (See Comments)    Urinary incontinence   Percocet [Oxycodone-Acetaminophen] Nausea And Vomiting   Prilosec  [Omeprazole] Rash   Rogaine [Minoxidil] Rash   Victoza [Liraglutide] Itching    ROS: Pertinent items noted in HPI and remainder of comprehensive ROS otherwise negative.  HOME MEDS: Current Outpatient Medications on File Prior to Visit  Medication Sig Dispense Refill   albuterol (VENTOLIN HFA) 108 (90 Base) MCG/ACT inhaler Inhale two puffs every four to six hours as needed for cough or wheeze. 18 g 1   amLODipine (NORVASC) 5 MG tablet Take 2.5 mg by mouth daily.      atorvastatin (LIPITOR) 40 MG tablet Take 1 tablet (40 mg total) by mouth daily at 6 PM. 30 tablet 0   B-D ULTRAFINE III SHORT PEN 31G X 8 MM MISC SMARTSIG:1 Each SUB-Q Daily     beclomethasone (QVAR) 40 MCG/ACT inhaler Inhale 2 puffs into the lungs 2 (two) times daily. (Patient taking differently: Inhale 2 puffs into the lungs 2 (two) times daily as needed. ) 10.6 g 5   cetirizine (ZYRTEC) 10 MG tablet Take 10 mg by mouth daily.     EPINEPHrine (EPIPEN 2-PAK) 0.3 mg/0.3 mL IJ SOAJ injection Use as directed for life-threatening allergic reaction. 2 each 1   escitalopram (LEXAPRO) 20 MG tablet Take 20 mg by mouth daily.     ferrous sulfate 325 (65 FE) MG tablet Take 365 mg by mouth daily.      folic acid (FOLVITE) 1 MG tablet Take 1 mg by mouth 2 (two) times daily.      Golimumab (SIMPONI [place]) Inject into the skin.     hydrALAZINE (APRESOLINE) 25 MG tablet Take 25 mg by mouth 3 (three) times daily.      Insulin Glargine (BASAGLAR KWIKPEN) 100 UNIT/ML SMARTSIG:72 Unit(s) SUB-Q Daily     irbesartan (AVAPRO) 300 MG tablet Take 300 mg by mouth daily.      latanoprost (XALATAN) 0.005 % ophthalmic solution 1 drop at bedtime.     levothyroxine (SYNTHROID) 125 MCG tablet Take 125 mcg by mouth daily before breakfast. BRAND ONLY     LORazepam (ATIVAN) 0.5 MG tablet Take 0.5 mg by mouth as needed for anxiety.     metFORMIN (GLUCOPHAGE-XR) 500 MG 24 hr tablet Take 1,500 mg by mouth daily.     Methotrexate Sodium  (METHOTREXATE, PF,) 50 MG/2ML injection INJECT 0.8ML ONCE PER WEEK     metoprolol succinate (TOPROL-XL) 25 MG 24 hr tablet Take 1 tablet (25 mg total) by mouth daily. NEED APPOINTMENT 30 tablet 0   Multiple Vitamins-Minerals (CENTRUM SILVER PO) Take 1 tablet by mouth daily.     TRULICITY 3 CW/8.8QB SOPN ADMINISTER 3 MG UNDER THE SKIN 1 TIME A WEEK  vitamin B-12 (CYANOCOBALAMIN) 100 MCG tablet Take 100 mcg by mouth daily.      VITAMIN D, ERGOCALCIFEROL, PO Take 2,000 mg by mouth 2 (two) times daily.     No current facility-administered medications on file prior to visit.    LABS/IMAGING: No results found for this or any previous visit (from the past 48 hour(s)). No results found.  LIPID PANEL:    Component Value Date/Time   CHOL 221 (H) 07/07/2018 0514   TRIG 266 (H) 07/07/2018 0514   HDL 42 07/07/2018 0514   CHOLHDL 5.3 07/07/2018 0514   VLDL 53 (H) 07/07/2018 0514   LDLCALC 126 (H) 07/07/2018 0514    WEIGHTS: Wt Readings from Last 3 Encounters:  02/15/20 245 lb (111.1 kg)  01/21/20 241 lb 9.6 oz (109.6 kg)  07/31/19 243 lb 12.8 oz (110.6 kg)    VITALS: BP (!) 148/72    Pulse 72    Temp (!) 97.2 F (36.2 C)    Ht 5' 4.5" (1.638 m)    Wt 245 lb (111.1 kg)    SpO2 98%    BMI 41.40 kg/m   EXAM: General appearance: alert and no distress Lungs: clear to auscultation bilaterally Heart: regular rate and rhythm, S1, S2 normal, no murmur, click, rub or gallop Extremities: extremities normal, atraumatic, no cyanosis or edema and Compression sleeve of left hand Neurologic: Mental status: Alert, oriented, thought content appropriate  EKG: Normal sinus rhythm 72-personally reviewed  ASSESSMENT: 1. History of mechanical fall-no syncope 2. Possible TIA 3. NSVT -low risk Myoview stress test (09/2018) 4. Pericardial effusion -small, stable, likely related to rheumatoid arthritis 5. Newly diagnosed rheumatoid arthritis 6. Fibromyalgia 7. History of false positive Myoview  in 2012 with normal coronaries by catheterization 8. Bilateral adrenal hyperplasia without Cushing's disease (per NIH work-up) 9. Insulin-dependent diabetes  PLAN: 1.   Jocelyn Kaufman has had less frequent PVCs and NSVT on beta-blockade.  I will go ahead and refill that today.  She denies any recurrent TIAs.  She continues to struggle with some arthritis and has significant left hand weakness.  No recurrent pericardial effusion as far as I can tell.  Follow-up with me annually or sooner as necessary.  Pixie Casino, MD, [facility], [place] Director of the Advanced Lipid Disorders &  Cardiovascular Risk Reduction Clinic Diplomate of the American Board of Clinical Lipidology Attending Cardiologist  Direct Dial: [phone]   Fax: [phone]  Website:  [email or web address] Sundquist 02/15/2020, 4:04 PM

## 2020-02-16 ENCOUNTER — Encounter: Payer: Self-pay | Admitting: Internal Medicine

## 2020-02-22 ENCOUNTER — Inpatient Hospital Stay: Payer: BC Managed Care – PPO | Attending: Hematology and Oncology

## 2020-02-22 ENCOUNTER — Other Ambulatory Visit: Payer: Self-pay

## 2020-02-22 DIAGNOSIS — D649 Anemia, unspecified: Secondary | ICD-10-CM | POA: Insufficient documentation

## 2020-02-22 LAB — CBC WITH DIFFERENTIAL (CANCER CENTER ONLY)
Abs Immature Granulocytes: 0.03 10*3/uL (ref 0.00–0.07)
Basophils Absolute: 0 10*3/uL (ref 0.0–0.1)
Basophils Relative: 1 %
Eosinophils Absolute: 0.1 10*3/uL (ref 0.0–0.5)
Eosinophils Relative: 2 %
HCT: 34.3 % — ABNORMAL LOW (ref 36.0–46.0)
Hemoglobin: 10.2 g/dL — ABNORMAL LOW (ref 12.0–15.0)
Immature Granulocytes: 1 %
Lymphocytes Relative: 23 %
Lymphs Abs: 1.6 10*3/uL (ref 0.7–4.0)
MCH: 27.3 pg (ref 26.0–34.0)
MCHC: 29.7 g/dL — ABNORMAL LOW (ref 30.0–36.0)
MCV: 91.7 fL (ref 80.0–100.0)
Monocytes Absolute: 0.5 10*3/uL (ref 0.1–1.0)
Monocytes Relative: 7 %
Neutro Abs: 4.4 10*3/uL (ref 1.7–7.7)
Neutrophils Relative %: 66 %
Platelet Count: 230 10*3/uL (ref 150–400)
RBC: 3.74 MIL/uL — ABNORMAL LOW (ref 3.87–5.11)
RDW: 14.7 % (ref 11.5–15.5)
WBC Count: 6.6 10*3/uL (ref 4.0–10.5)
nRBC: 0 % (ref 0.0–0.2)

## 2020-03-04 ENCOUNTER — Ambulatory Visit (INDEPENDENT_AMBULATORY_CARE_PROVIDER_SITE_OTHER): Payer: BC Managed Care – PPO

## 2020-03-04 DIAGNOSIS — J309 Allergic rhinitis, unspecified: Secondary | ICD-10-CM | POA: Diagnosis not present

## 2020-03-23 ENCOUNTER — Inpatient Hospital Stay: Payer: BC Managed Care – PPO | Attending: Hematology and Oncology

## 2020-03-23 ENCOUNTER — Other Ambulatory Visit: Payer: Self-pay

## 2020-03-23 DIAGNOSIS — D649 Anemia, unspecified: Secondary | ICD-10-CM | POA: Insufficient documentation

## 2020-03-23 LAB — CBC WITH DIFFERENTIAL (CANCER CENTER ONLY)
Abs Immature Granulocytes: 0.02 10*3/uL (ref 0.00–0.07)
Basophils Absolute: 0 10*3/uL (ref 0.0–0.1)
Basophils Relative: 1 %
Eosinophils Absolute: 0.2 10*3/uL (ref 0.0–0.5)
Eosinophils Relative: 3 %
HCT: 33.5 % — ABNORMAL LOW (ref 36.0–46.0)
Hemoglobin: 9.8 g/dL — ABNORMAL LOW (ref 12.0–15.0)
Immature Granulocytes: 0 %
Lymphocytes Relative: 21 %
Lymphs Abs: 1.3 10*3/uL (ref 0.7–4.0)
MCH: 27.1 pg (ref 26.0–34.0)
MCHC: 29.3 g/dL — ABNORMAL LOW (ref 30.0–36.0)
MCV: 92.5 fL (ref 80.0–100.0)
Monocytes Absolute: 0.4 10*3/uL (ref 0.1–1.0)
Monocytes Relative: 7 %
Neutro Abs: 4.4 10*3/uL (ref 1.7–7.7)
Neutrophils Relative %: 68 %
Platelet Count: 201 10*3/uL (ref 150–400)
RBC: 3.62 MIL/uL — ABNORMAL LOW (ref 3.87–5.11)
RDW: 14.6 % (ref 11.5–15.5)
WBC Count: 6.3 10*3/uL (ref 4.0–10.5)
nRBC: 0 % (ref 0.0–0.2)

## 2020-03-24 LAB — ERYTHROPOIETIN: Erythropoietin: 27.9 m[IU]/mL — ABNORMAL HIGH (ref 2.6–18.5)

## 2020-04-01 ENCOUNTER — Ambulatory Visit (INDEPENDENT_AMBULATORY_CARE_PROVIDER_SITE_OTHER): Payer: BC Managed Care – PPO

## 2020-04-01 DIAGNOSIS — J309 Allergic rhinitis, unspecified: Secondary | ICD-10-CM | POA: Diagnosis not present

## 2020-04-06 ENCOUNTER — Encounter: Payer: Self-pay | Admitting: Allergy

## 2020-04-06 ENCOUNTER — Other Ambulatory Visit: Payer: Self-pay

## 2020-04-06 ENCOUNTER — Ambulatory Visit: Payer: BC Managed Care – PPO | Admitting: Allergy

## 2020-04-06 VITALS — BP 130/60 | HR 78 | Temp 98.0°F | Wt 246.2 lb

## 2020-04-06 DIAGNOSIS — J3089 Other allergic rhinitis: Secondary | ICD-10-CM | POA: Diagnosis not present

## 2020-04-06 DIAGNOSIS — J4520 Mild intermittent asthma, uncomplicated: Secondary | ICD-10-CM | POA: Diagnosis not present

## 2020-04-06 NOTE — Patient Instructions (Addendum)
   1. Continue Qvar Redihaler 2 puffs TWICE a day as needed.  If you are not meeting the below goals let me know and take Qvar daily at that time for improved control.    2.  Continue Albuterol HFA 2 puffs every 4-6 hours if needed for cough, wheezing, shortness of breath or chest tightness.  Monitor frequency of use.   3. Consider nasal saline spray/gel for nasal symptoms  4. OTC antihistamine - stop Zyrtec.  Start Xyzal 5mg  daily (samples provided)  5. Continue immunotherapy monthly and access to [facility]  Respiratory control goals:   Full participation in all desired activities (may need albuterol before activity)  Albuterol use two time or less a week on average (not counting use with activity)  Cough interfering with sleep two time or less a month  Oral steroids no more than once a year  No hospitalizations   6. Return to clinic in 6 months or earlier if problem

## 2020-04-06 NOTE — Progress Notes (Signed)
Follow-up Note  RE: Jocelyn Kaufman MRN: [id number] DOB: 1956-02-03 Date of Office Visit: 04/06/2020   History of present illness: Jocelyn Kaufman is a 64 y.o. female presenting today for follow-up of asthma and allergies.  She was last seen in the office on 12/03/19 by myself.  She states has done well since last visit without any major health changes.  In regards to her asthma she states he has had 3 episodes since last visit where she did need to use albuterol (2 occasions) and Qvar (1 occasion).  The time hse used the Qvar she reports having shortness of breath that seemed to involve lung "deeper" than usual.  With the albuterol use she reports symptoms of shortness of breath, chest tightness, wheeze and feels that is involves the upper parts of her lungs.  She does feel that maybe pollen exposure was a triggering event.  She did get relief of symptoms with albuterol/Qvar use.  She has not required any ED/UC visits or any systemic steroid needs.  With her allergies she continues on immunotherapy at maintenance monthly dosing.  She feels zyrtec may not be as effective any more.   She did receive Pfizer covid vaccine on 12/25/19 and 01/22/20.     Review of systems: Review of Systems  Constitutional: Negative.   HENT: Negative.   Eyes: Negative.   Respiratory: Negative.   Cardiovascular: Negative.   Gastrointestinal: Negative.   Skin: Negative.   Neurological: Negative.     All other systems negative unless noted above in HPI  Past medical/social/surgical/family history have been reviewed and are unchanged unless specifically indicated below.  No changes  Medication List: Current Outpatient Medications  Medication Sig Dispense Refill  . albuterol (VENTOLIN HFA) 108 (90 Base) MCG/ACT inhaler Inhale two puffs every four to six hours as needed for cough or wheeze. 18 g 1  . amLODipine (NORVASC) 5 MG tablet Take 2.5 mg by mouth daily.     Marland Kitchen atorvastatin (LIPITOR) 40 MG tablet Take 1 tablet  (40 mg total) by mouth daily at 6 PM. 30 tablet 0  . B-D ULTRAFINE III SHORT PEN 31G X 8 MM MISC SMARTSIG:1 Each SUB-Q Daily    . cetirizine (ZYRTEC) 10 MG tablet Take 10 mg by mouth daily.    Marland Kitchen EPINEPHrine (EPIPEN 2-PAK) 0.3 mg/0.3 mL IJ SOAJ injection Use as directed for life-threatening allergic reaction. 2 each 1  . escitalopram (LEXAPRO) 20 MG tablet Take 20 mg by mouth daily.    . ferrous sulfate 325 (65 FE) MG tablet Take 365 mg by mouth daily.     . folic acid (FOLVITE) 1 MG tablet Take 1 mg by mouth 2 (two) times daily.     . hydrALAZINE (APRESOLINE) 25 MG tablet Take 25 mg by mouth 3 (three) times daily.     . Insulin Glargine (BASAGLAR KWIKPEN) 100 UNIT/ML SMARTSIG:72 Unit(s) SUB-Q Daily    . irbesartan (AVAPRO) 300 MG tablet Take 300 mg by mouth daily.     Marland Kitchen latanoprost (XALATAN) 0.005 % ophthalmic solution 1 drop at bedtime.    Marland Kitchen levothyroxine (SYNTHROID) 125 MCG tablet Take 125 mcg by mouth daily before breakfast. BRAND ONLY    . LORazepam (ATIVAN) 0.5 MG tablet Take 0.5 mg by mouth as needed for anxiety.    . metFORMIN (GLUCOPHAGE-XR) 500 MG 24 hr tablet Take 1,500 mg by mouth daily.    . Methotrexate Sodium (METHOTREXATE, PF,) 50 MG/2ML injection INJECT 0.8ML ONCE PER WEEK    .  metoprolol succinate (TOPROL-XL) 25 MG 24 hr tablet Take 1 tablet (25 mg total) by mouth daily. 90 tablet 3  . Multiple Vitamins-Minerals (CENTRUM SILVER PO) Take 1 tablet by mouth daily.    . TRULICITY 3 EE/1.0OF SOPN ADMINISTER 3 MG UNDER THE SKIN 1 TIME A WEEK    . vitamin B-12 (CYANOCOBALAMIN) 100 MCG tablet Take 100 mcg by mouth daily.     Marland Kitchen VITAMIN D, ERGOCALCIFEROL, PO Take 2,000 mg by mouth 2 (two) times daily.    . beclomethasone (QVAR) 40 MCG/ACT inhaler Inhale 2 puffs into the lungs 2 (two) times daily. (Patient taking differently: Inhale 2 puffs into the lungs 2 (two) times daily as needed. ) 10.6 g 5  . Golimumab (SIMPONI [place]) Inject into the skin.     No current facility-administered  medications for this visit.     Known medication allergies: Allergies  Allergen Reactions  . Jardiance [Empagliflozin] Itching and Swelling  . Latex   . Invokana [Canagliflozin] Other (See Comments)    Urinary incontinence  . Percocet [Oxycodone-Acetaminophen] Nausea And Vomiting  . Prilosec [Omeprazole] Rash  . Rogaine [Minoxidil] Rash  . Victoza [Liraglutide] Itching     Physical examination: Blood pressure 130/60, pulse 78, temperature 98 F (36.7 C), temperature source Temporal, weight 246 lb 3.2 oz (111.7 kg), SpO2 97 %.  General: Alert, interactive, in no acute distress. HEENT: PERRLA, TMs pearly gray, turbinates minimally edematous without discharge, post-pharynx non erythematous. Neck: Supple without lymphadenopathy. Lungs: Clear to auscultation without wheezing, rhonchi or rales. {no increased work of breathing. CV: Normal S1, S2 without murmurs. Abdomen: Nondistended, nontender. Skin: Warm and dry, without lesions or rashes. Extremities:  No clubbing, cyanosis or edema. Neuro:   Grossly intact.  Diagnositics/Labs:  Spirometry: FEV1: 1.7L 85%, FVC: 1.95L 76%, ratio consistent with nonobstructive pattern for age and stable  ACT score 21  Assessment and plan: Mild intermittent asthma -at this time remains under good control.   We discussed today if symptoms become more frequent and not meeting the below goals then Qvar should become a daily controller medication. Allergic rhinitis -continue allergen immunotherapy monthly.  Will change Zyrtec to Xyzal for hopeful better symptom control.    1. Continue Qvar Redihaler 2 puffs TWICE a day as needed.  If you are not meeting the below goals let me know and take Qvar daily at that time for improved control.    2.  Continue Albuterol HFA 2 puffs every 4-6 hours if needed for cough, wheezing, shortness of breath or chest tightness.  Monitor frequency of use.   3. Consider nasal saline spray/gel for nasal symptoms  4. OTC  antihistamine - stop Zyrtec.  Start Xyzal 5mg  daily (samples provided)  5. Continue immunotherapy monthly and access to [facility]  Respiratory control goals:   Full participation in all desired activities (may need albuterol before activity)  Albuterol use two time or less a week on average (not counting use with activity)  Cough interfering with sleep two time or less a month  Oral steroids no more than once a year  No hospitalizations   6. Return to clinic in 6 months or earlier if problem   I appreciate the opportunity to take part in [facility] care. Please do not hesitate to contact me with questions.  Sincerely,   Prudy Feeler, MD Allergy/Immunology [facility] [place]

## 2020-04-07 ENCOUNTER — Ambulatory Visit: Payer: BC Managed Care – PPO | Admitting: Allergy

## 2020-04-18 NOTE — Progress Notes (Signed)
EXP 04/19/21 

## 2020-04-21 DIAGNOSIS — J301 Allergic rhinitis due to pollen: Secondary | ICD-10-CM | POA: Diagnosis not present

## 2020-04-24 NOTE — Progress Notes (Signed)
Patient Care Team: Deland Pretty, MD as PCP - General (Internal Medicine)  DIAGNOSIS:    ICD-10-CM   1. Normochromic normocytic anemia  D64.9     CHIEF COMPLIANT: Follow-up of normocytic anemia  INTERVAL HISTORY: Jocelyn Kaufman is a 64 y.o. with above-mentioned history of normocytic anemia. She presents to the clinic today for follow-up.   Patient is telling me that she is feeling more fatigued than before and would like [place] to do something about her anemia.  She also feels dizziness and shortness of breath to minimal exertion.  She has had multiple other comorbidities.  ALLERGIES:  is allergic to jardiance [empagliflozin], latex, invokana [canagliflozin], percocet [oxycodone-acetaminophen], prilosec [omeprazole], rogaine [minoxidil], and victoza [liraglutide].  MEDICATIONS:  Current Outpatient Medications  Medication Sig Dispense Refill  . albuterol (VENTOLIN HFA) 108 (90 Base) MCG/ACT inhaler Inhale two puffs every four to six hours as needed for cough or wheeze. 18 g 1  . amLODipine (NORVASC) 5 MG tablet Take 2.5 mg by mouth daily.     Marland Kitchen atorvastatin (LIPITOR) 40 MG tablet Take 1 tablet (40 mg total) by mouth daily at 6 PM. 30 tablet 0  . B-D ULTRAFINE III SHORT PEN 31G X 8 MM MISC SMARTSIG:1 Each SUB-Q Daily    . beclomethasone (QVAR) 40 MCG/ACT inhaler Inhale 2 puffs into the lungs 2 (two) times daily. (Patient taking differently: Inhale 2 puffs into the lungs 2 (two) times daily as needed. ) 10.6 g 5  . cetirizine (ZYRTEC) 10 MG tablet Take 10 mg by mouth daily.    Marland Kitchen EPINEPHrine (EPIPEN 2-PAK) 0.3 mg/0.3 mL IJ SOAJ injection Use as directed for life-threatening allergic reaction. 2 each 1  . escitalopram (LEXAPRO) 20 MG tablet Take 20 mg by mouth daily.    . ferrous sulfate 325 (65 FE) MG tablet Take 365 mg by mouth daily.     . folic acid (FOLVITE) 1 MG tablet Take 1 mg by mouth 2 (two) times daily.     . hydrALAZINE (APRESOLINE) 25 MG tablet Take 25 mg by mouth 3 (three) times  daily.     . Insulin Glargine (BASAGLAR KWIKPEN) 100 UNIT/ML SMARTSIG:72 Unit(s) SUB-Q Daily    . irbesartan (AVAPRO) 300 MG tablet Take 300 mg by mouth daily.     Marland Kitchen latanoprost (XALATAN) 0.005 % ophthalmic solution 1 drop at bedtime.    Marland Kitchen levothyroxine (SYNTHROID) 125 MCG tablet Take 125 mcg by mouth daily before breakfast. BRAND ONLY    . LORazepam (ATIVAN) 0.5 MG tablet Take 0.5 mg by mouth as needed for anxiety.    . metFORMIN (GLUCOPHAGE-XR) 500 MG 24 hr tablet Take 1,500 mg by mouth daily.    . Methotrexate Sodium (METHOTREXATE, PF,) 50 MG/2ML injection INJECT 0.8ML ONCE PER WEEK    . metoprolol succinate (TOPROL-XL) 25 MG 24 hr tablet Take 1 tablet (25 mg total) by mouth daily. 90 tablet 3  . Multiple Vitamins-Minerals (CENTRUM SILVER PO) Take 1 tablet by mouth daily.    . TRULICITY 3 OV/7.8HY SOPN ADMINISTER 3 MG UNDER THE SKIN 1 TIME A WEEK    . vitamin B-12 (CYANOCOBALAMIN) 100 MCG tablet Take 100 mcg by mouth daily.     Marland Kitchen VITAMIN D, ERGOCALCIFEROL, PO Take 2,000 mg by mouth 2 (two) times daily.     No current facility-administered medications for this visit.    PHYSICAL EXAMINATION: ECOG PERFORMANCE STATUS: 1 - Symptomatic but completely ambulatory  Vitals:   04/25/20 1003  BP: 138/67  Pulse:  82  Resp: 17  Temp: 98.5 F (36.9 C)  SpO2: 100%   Filed Weights   04/25/20 1003  Weight: 246 lb 14.4 oz (112 kg)    LABORATORY DATA:  I have reviewed the data as listed CMP Latest Ref Rng & Units 10/07/2018 07/12/2018 07/11/2018  Glucose 65 - 99 mg/dL 123(H) 128(H) 143(H)  BUN 8 - 27 mg/dL 26 47(H) 43(H)  Creatinine 0.57 - 1.00 mg/dL 1.13(H) 1.35(H) 1.60(H)  Sodium 134 - 144 mmol/L 140 136 136  Potassium 3.5 - 5.2 mmol/L 5.1 4.6 4.3  Chloride 96 - 106 mmol/L 105 103 103  CO2 20 - 29 mmol/L 21 23 22   Calcium 8.7 - 10.3 mg/dL 9.4 8.7(L) 8.8(L)  Total Protein 6.5 - 8.1 g/dL - - -  Total Bilirubin 0.3 - 1.2 mg/dL - - -  Alkaline Phos 38 - 126 U/L - - -  AST 15 - 41 U/L - - -   ALT 0 - 44 U/L - - -    Lab Results  Component Value Date   WBC 6.6 04/25/2020   HGB 9.6 (L) 04/25/2020   HCT 31.9 (L) 04/25/2020   MCV 89.6 04/25/2020   PLT 207 04/25/2020   NEUTROABS 4.5 04/25/2020    ASSESSMENT & PLAN:  Normochromic normocytic anemia Patient has history of hypothyroidism as well as possible adrenal dysfunction issues for which she goes to [facility]  Previous blood work review: 1.Erythropoietin: 24.2 2. 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 We will request starting the patient on Aranesp on 05/06/2020.  No orders of the defined types were placed in this encounter.  The patient has a good understanding of the overall plan. she agrees with it. she will call with any problems that may develop before the next visit here.  Total time spent: 20 mins including face to face time and time spent for planning, charting and coordination of care  Nicholas Lose, MD 04/25/2020  I, Cloyde Reams Dorshimer, am acting as scribe for Dr. Nicholas Lose.  I have reviewed the above documentation for accuracy and completeness, and I agree with the above.

## 2020-04-25 ENCOUNTER — Other Ambulatory Visit: Payer: Self-pay

## 2020-04-25 ENCOUNTER — Inpatient Hospital Stay: Payer: BC Managed Care – PPO

## 2020-04-25 ENCOUNTER — Inpatient Hospital Stay: Payer: BC Managed Care – PPO | Attending: Hematology and Oncology | Admitting: Hematology and Oncology

## 2020-04-25 DIAGNOSIS — D649 Anemia, unspecified: Secondary | ICD-10-CM

## 2020-04-25 DIAGNOSIS — D631 Anemia in chronic kidney disease: Secondary | ICD-10-CM | POA: Diagnosis not present

## 2020-04-25 DIAGNOSIS — D638 Anemia in other chronic diseases classified elsewhere: Secondary | ICD-10-CM

## 2020-04-25 DIAGNOSIS — Z79899 Other long term (current) drug therapy: Secondary | ICD-10-CM | POA: Diagnosis not present

## 2020-04-25 DIAGNOSIS — E039 Hypothyroidism, unspecified: Secondary | ICD-10-CM | POA: Diagnosis not present

## 2020-04-25 DIAGNOSIS — N189 Chronic kidney disease, unspecified: Secondary | ICD-10-CM | POA: Diagnosis not present

## 2020-04-25 LAB — CBC WITH DIFFERENTIAL (CANCER CENTER ONLY)
Abs Immature Granulocytes: 0.01 10*3/uL (ref 0.00–0.07)
Basophils Absolute: 0 10*3/uL (ref 0.0–0.1)
Basophils Relative: 0 %
Eosinophils Absolute: 0.1 10*3/uL (ref 0.0–0.5)
Eosinophils Relative: 2 %
HCT: 31.9 % — ABNORMAL LOW (ref 36.0–46.0)
Hemoglobin: 9.6 g/dL — ABNORMAL LOW (ref 12.0–15.0)
Immature Granulocytes: 0 %
Lymphocytes Relative: 24 %
Lymphs Abs: 1.6 10*3/uL (ref 0.7–4.0)
MCH: 27 pg (ref 26.0–34.0)
MCHC: 30.1 g/dL (ref 30.0–36.0)
MCV: 89.6 fL (ref 80.0–100.0)
Monocytes Absolute: 0.3 10*3/uL (ref 0.1–1.0)
Monocytes Relative: 5 %
Neutro Abs: 4.5 10*3/uL (ref 1.7–7.7)
Neutrophils Relative %: 69 %
Platelet Count: 207 10*3/uL (ref 150–400)
RBC: 3.56 MIL/uL — ABNORMAL LOW (ref 3.87–5.11)
RDW: 14.3 % (ref 11.5–15.5)
WBC Count: 6.6 10*3/uL (ref 4.0–10.5)
nRBC: 0 % (ref 0.0–0.2)

## 2020-04-25 MED ORDER — IRBESARTAN 150 MG PO TABS: 150.0000 mg | ORAL_TABLET | Freq: Every day | ORAL | Status: DC

## 2020-04-25 MED ORDER — EYLEA 2 MG/0.05ML IZ SOLN: 2.0000 mg | INTRAVITREAL | Status: AC

## 2020-04-25 MED ORDER — EZETIMIBE 10 MG PO TABS: 10.0000 mg | ORAL_TABLET | Freq: Every day | ORAL | Status: DC

## 2020-04-25 MED ORDER — TRULICITY 3 MG/0.5ML ~~LOC~~ SOAJ: 4.5000 mg | SUBCUTANEOUS | Status: DC

## 2020-04-25 MED ORDER — LEVOCETIRIZINE DIHYDROCHLORIDE 5 MG PO TABS: 5.0000 mg | ORAL_TABLET | Freq: Every evening | ORAL | Status: DC

## 2020-04-25 NOTE — Assessment & Plan Note (Signed)
Patient has history of hypothyroidism as well as possible adrenal dysfunction issues for which she goes to [facility]  Previous blood work review: 1.Erythropoietin: 24.2 2. 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

## 2020-05-03 ENCOUNTER — Ambulatory Visit (INDEPENDENT_AMBULATORY_CARE_PROVIDER_SITE_OTHER): Payer: BC Managed Care – PPO

## 2020-05-03 DIAGNOSIS — J309 Allergic rhinitis, unspecified: Secondary | ICD-10-CM

## 2020-05-06 ENCOUNTER — Inpatient Hospital Stay: Payer: BC Managed Care – PPO

## 2020-05-06 ENCOUNTER — Other Ambulatory Visit: Payer: Self-pay

## 2020-05-06 ENCOUNTER — Inpatient Hospital Stay: Payer: BC Managed Care – PPO | Attending: Hematology and Oncology

## 2020-05-06 VITALS — BP 148/69 | HR 80 | Temp 99.2°F | Resp 18

## 2020-05-06 DIAGNOSIS — D638 Anemia in other chronic diseases classified elsewhere: Secondary | ICD-10-CM

## 2020-05-06 DIAGNOSIS — D631 Anemia in chronic kidney disease: Secondary | ICD-10-CM | POA: Diagnosis not present

## 2020-05-06 DIAGNOSIS — N189 Chronic kidney disease, unspecified: Secondary | ICD-10-CM | POA: Insufficient documentation

## 2020-05-06 DIAGNOSIS — D649 Anemia, unspecified: Secondary | ICD-10-CM

## 2020-05-06 LAB — CBC WITH DIFFERENTIAL (CANCER CENTER ONLY)
Abs Immature Granulocytes: 0.01 10*3/uL (ref 0.00–0.07)
Basophils Absolute: 0.1 10*3/uL (ref 0.0–0.1)
Basophils Relative: 1 %
Eosinophils Absolute: 0.2 10*3/uL (ref 0.0–0.5)
Eosinophils Relative: 2 %
HCT: 33.4 % — ABNORMAL LOW (ref 36.0–46.0)
Hemoglobin: 9.9 g/dL — ABNORMAL LOW (ref 12.0–15.0)
Immature Granulocytes: 0 %
Lymphocytes Relative: 21 %
Lymphs Abs: 1.5 10*3/uL (ref 0.7–4.0)
MCH: 26.8 pg (ref 26.0–34.0)
MCHC: 29.6 g/dL — ABNORMAL LOW (ref 30.0–36.0)
MCV: 90.5 fL (ref 80.0–100.0)
Monocytes Absolute: 0.4 10*3/uL (ref 0.1–1.0)
Monocytes Relative: 6 %
Neutro Abs: 5.1 10*3/uL (ref 1.7–7.7)
Neutrophils Relative %: 70 %
Platelet Count: 209 10*3/uL (ref 150–400)
RBC: 3.69 MIL/uL — ABNORMAL LOW (ref 3.87–5.11)
RDW: 14.3 % (ref 11.5–15.5)
WBC Count: 7.2 10*3/uL (ref 4.0–10.5)
nRBC: 0 % (ref 0.0–0.2)

## 2020-05-06 MED ORDER — DARBEPOETIN ALFA 300 MCG/0.6ML IJ SOSY
PREFILLED_SYRINGE | INTRAMUSCULAR | Status: AC
  Filled 2020-05-06: qty 0.6

## 2020-05-06 MED ORDER — DARBEPOETIN ALFA 300 MCG/0.6ML IJ SOSY
300.0000 ug | PREFILLED_SYRINGE | Freq: Once | INTRAMUSCULAR | Status: AC
  Administered 2020-05-06: 300 ug via SUBCUTANEOUS

## 2020-05-06 NOTE — Patient Instructions (Signed)
Darbepoetin Alfa injection What is this medicine? DARBEPOETIN ALFA (dar be POE e tin AL fa) helps your body make more red blood cells. It is used to treat anemia caused by chronic kidney failure and chemotherapy. This medicine may be used for other purposes; ask your health care provider or pharmacist if you have questions. COMMON BRAND NAME(S): Aranesp What should I tell my health care provider before I take this medicine? They need to know if you have any of these conditions:  blood clotting disorders or history of blood clots  cancer patient not on chemotherapy  cystic fibrosis  heart disease, such as angina, heart failure, or a history of a heart attack  hemoglobin level of 12 g/dL or greater  high blood pressure  low levels of folate, iron, or vitamin B12  seizures  an unusual or allergic reaction to darbepoetin, erythropoietin, albumin, hamster proteins, latex, other medicines, foods, dyes, or preservatives  pregnant or trying to get pregnant  breast-feeding How should I use this medicine? This medicine is for injection into a vein or under the skin. It is usually given by a health care professional in a hospital or clinic setting. If you get this medicine at home, you will be taught how to prepare and give this medicine. Use exactly as directed. Take your medicine at regular intervals. Do not take your medicine more often than directed. It is important that you put your used needles and syringes in a special sharps container. Do not put them in a trash can. If you do not have a sharps container, call your pharmacist or healthcare provider to get one. A special MedGuide will be given to you by the pharmacist with each prescription and refill. Be sure to read this information carefully each time. Talk to your pediatrician regarding the use of this medicine in children. While this medicine may be used in children as young as 1 month of age for selected conditions, precautions do  apply. Overdosage: If you think you have taken too much of this medicine contact a poison control center or emergency room at once. NOTE: This medicine is only for you. Do not share this medicine with others. What if I miss a dose? If you miss a dose, take it as soon as you can. If it is almost time for your next dose, take only that dose. Do not take double or extra doses. What may interact with this medicine? Do not take this medicine with any of the following medications:  epoetin alfa This list may not describe all possible interactions. Give your health care provider a list of all the medicines, herbs, non-prescription drugs, or dietary supplements you use. Also tell them if you smoke, drink alcohol, or use illegal drugs. Some items may interact with your medicine. What should I watch for while using this medicine? Your condition will be monitored carefully while you are receiving this medicine. You may need blood work done while you are taking this medicine. This medicine may cause a decrease in vitamin B6. You should make sure that you get enough vitamin B6 while you are taking this medicine. Discuss the foods you eat and the vitamins you take with your health care professional. What side effects may I notice from receiving this medicine? Side effects that you should report to your doctor or health care professional as soon as possible:  allergic reactions like skin rash, itching or hives, swelling of the face, lips, or tongue  breathing problems  changes in   vision  chest pain  confusion, trouble speaking or understanding  feeling faint or lightheaded, falls  high blood pressure  muscle aches or pains  pain, swelling, warmth in the leg  rapid weight gain  severe headaches  sudden numbness or weakness of the face, arm or leg  trouble walking, dizziness, loss of balance or coordination  seizures (convulsions)  swelling of the ankles, feet, hands  unusually weak or  tired Side effects that usually do not require medical attention (report to your doctor or health care professional if they continue or are bothersome):  diarrhea  fever, chills (flu-like symptoms)  headaches  nausea, vomiting  redness, stinging, or swelling at site where injected This list may not describe all possible side effects. Call your doctor for medical advice about side effects. You may report side effects to FDA at 1-800-FDA-1088. Where should I keep my medicine? Keep out of the reach of children. Store in a refrigerator between 2 and 8 degrees C (36 and 46 degrees F). Do not freeze. Do not shake. Throw away any unused portion if using a single-dose vial. Throw away any unused medicine after the expiration date. NOTE: This sheet is a summary. It may not cover all possible information. If you have questions about this medicine, talk to your doctor, pharmacist, or health care provider.  2020 [employer] Standard (2017-10-30 16:44:20)  

## 2020-05-22 ENCOUNTER — Encounter (HOSPITAL_COMMUNITY): Payer: Self-pay

## 2020-05-22 ENCOUNTER — Other Ambulatory Visit: Payer: Self-pay

## 2020-05-22 ENCOUNTER — Emergency Department (HOSPITAL_COMMUNITY): Payer: BC Managed Care – PPO

## 2020-05-22 ENCOUNTER — Emergency Department (HOSPITAL_COMMUNITY)
Admission: EM | Admit: 2020-05-22 | Discharge: 2020-05-22 | Disposition: A | Discharge status: Home / Self Care | Payer: BC Managed Care – PPO | Attending: Emergency Medicine | Admitting: Emergency Medicine

## 2020-05-22 DIAGNOSIS — W19XXXA Unspecified fall, initial encounter: Secondary | ICD-10-CM

## 2020-05-22 DIAGNOSIS — M79621 Pain in right upper arm: Secondary | ICD-10-CM | POA: Diagnosis not present

## 2020-05-22 DIAGNOSIS — I10 Essential (primary) hypertension: Secondary | ICD-10-CM | POA: Insufficient documentation

## 2020-05-22 DIAGNOSIS — Z91040 Latex allergy status: Secondary | ICD-10-CM | POA: Insufficient documentation

## 2020-05-22 DIAGNOSIS — G5711 Meralgia paresthetica, right lower limb: Secondary | ICD-10-CM | POA: Diagnosis not present

## 2020-05-22 DIAGNOSIS — E119 Type 2 diabetes mellitus without complications: Secondary | ICD-10-CM | POA: Diagnosis not present

## 2020-05-22 DIAGNOSIS — Y999 Unspecified external cause status: Secondary | ICD-10-CM | POA: Insufficient documentation

## 2020-05-22 DIAGNOSIS — Y9389 Activity, other specified: Secondary | ICD-10-CM | POA: Diagnosis not present

## 2020-05-22 DIAGNOSIS — Z794 Long term (current) use of insulin: Secondary | ICD-10-CM | POA: Insufficient documentation

## 2020-05-22 DIAGNOSIS — Y929 Unspecified place or not applicable: Secondary | ICD-10-CM | POA: Insufficient documentation

## 2020-05-22 DIAGNOSIS — J45909 Unspecified asthma, uncomplicated: Secondary | ICD-10-CM | POA: Insufficient documentation

## 2020-05-22 DIAGNOSIS — M25511 Pain in right shoulder: Secondary | ICD-10-CM | POA: Diagnosis not present

## 2020-05-22 DIAGNOSIS — Z8673 Personal history of transient ischemic attack (TIA), 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 deficits: Secondary | ICD-10-CM | POA: Insufficient documentation

## 2020-05-22 DIAGNOSIS — W010XXA Fall on same level from slipping, tripping and stumbling without subsequent striking against object, initial encounter: Secondary | ICD-10-CM | POA: Insufficient documentation

## 2020-05-22 DIAGNOSIS — M25521 Pain in right elbow: Secondary | ICD-10-CM | POA: Diagnosis not present

## 2020-05-22 DIAGNOSIS — Z79899 Other long term (current) drug therapy: Secondary | ICD-10-CM | POA: Diagnosis not present

## 2020-05-22 MED ORDER — LIDOCAINE 5 % EX PTCH: 1.0000 | MEDICATED_PATCH | CUTANEOUS | 0 refills | Status: DC

## 2020-05-22 MED ORDER — METHOCARBAMOL 500 MG PO TABS
500.0000 mg | ORAL_TABLET | Freq: Two times a day (BID) | ORAL | 0 refills | Status: DC
Start: 2020-05-22 — End: 2020-08-12

## 2020-05-22 NOTE — Discharge Instructions (Addendum)
You may take your pain medication you have at home.  May also add anti-inflammatory such as naproxen or ibuprofen if you do not have a history of GI bleeds.  I would suggest icing your shoulder as well as applying the lidocaine patches.  These patches stay in place for 12 hours and remove for 12 hours.  He must be patch free for 12 hours before completion additional patch.  I have also written you for a muscle relaxer which she may take as well.  This may make you sleepy.  Do not take if planning on driving or operating heavy machinery.  Follow-up with Dr. Griffin Basil.  Return for any worsening symptoms.
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
## 2020-05-22 NOTE — ED Triage Notes (Signed)
Pt presents with c/o fall that occurred yesterday. Pt reports she fell onto her right side. Pt c/o right arm pain and right shoulder pain radiating to her right elbow.
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
## 2020-05-27 ENCOUNTER — Other Ambulatory Visit: Payer: Self-pay

## 2020-05-27 ENCOUNTER — Telehealth: Payer: Self-pay | Admitting: Hematology and Oncology

## 2020-05-27 ENCOUNTER — Inpatient Hospital Stay: Payer: BC Managed Care – PPO | Admitting: Hematology and Oncology

## 2020-05-27 ENCOUNTER — Inpatient Hospital Stay: Payer: BC Managed Care – PPO

## 2020-05-27 DIAGNOSIS — N189 Chronic kidney disease, unspecified: Secondary | ICD-10-CM | POA: Diagnosis not present

## 2020-05-27 DIAGNOSIS — D649 Anemia, unspecified: Secondary | ICD-10-CM

## 2020-05-27 LAB — CBC WITH DIFFERENTIAL (CANCER CENTER ONLY)
Abs Immature Granulocytes: 0.01 10*3/uL (ref 0.00–0.07)
Basophils Absolute: 0 10*3/uL (ref 0.0–0.1)
Basophils Relative: 1 %
Eosinophils Absolute: 0.2 10*3/uL (ref 0.0–0.5)
Eosinophils Relative: 2 %
HCT: 35.5 % — ABNORMAL LOW (ref 36.0–46.0)
Hemoglobin: 10.6 g/dL — ABNORMAL LOW (ref 12.0–15.0)
Immature Granulocytes: 0 %
Lymphocytes Relative: 26 %
Lymphs Abs: 2 10*3/uL (ref 0.7–4.0)
MCH: 26.8 pg (ref 26.0–34.0)
MCHC: 29.9 g/dL — ABNORMAL LOW (ref 30.0–36.0)
MCV: 89.9 fL (ref 80.0–100.0)
Monocytes Absolute: 0.5 10*3/uL (ref 0.1–1.0)
Monocytes Relative: 6 %
Neutro Abs: 4.8 10*3/uL (ref 1.7–7.7)
Neutrophils Relative %: 65 %
Platelet Count: 199 10*3/uL (ref 150–400)
RBC: 3.95 MIL/uL (ref 3.87–5.11)
RDW: 14.8 % (ref 11.5–15.5)
WBC Count: 7.4 10*3/uL (ref 4.0–10.5)
nRBC: 0 % (ref 0.0–0.2)

## 2020-05-27 NOTE — Telephone Encounter (Signed)
Scheduled appts per 7/30 los. Gave pt a print out of AVS.

## 2020-05-27 NOTE — Assessment & Plan Note (Signed)
Patient has history of hypothyroidism as well as possible adrenal dysfunction issues for which she goes to [facility]  Previous blood work review: 1.Erythropoietin: 24.2 2. 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 Lab review 03/23/2020: Erythropoietin 27.9, hemoglobin 9.6 May 27, 2020:  Return to clinic every 3 weeks for Aranesp injections and every 6 weeks of follow-up with me with labs

## 2020-06-15 ENCOUNTER — Other Ambulatory Visit: Payer: Self-pay | Admitting: *Deleted

## 2020-06-15 DIAGNOSIS — D638 Anemia in other chronic diseases classified elsewhere: Secondary | ICD-10-CM

## 2020-06-16 NOTE — Progress Notes (Signed)
Patient Care Team: Deland Pretty, MD as PCP - General (Internal Medicine)  DIAGNOSIS:    ICD-10-CM   1. Normochromic normocytic anemia  D64.9     CHIEF COMPLIANT: Follow-up of normocytic anemia  INTERVAL HISTORY: Jocelyn Kaufman is a 64 y.o. with above-mentioned history of normocytic anemia currently receiving treatment with Aranesp.She presents to the clinic todayfor follow-up.  ALLERGIES:  is allergic to jardiance [empagliflozin], latex, invokana [canagliflozin], percocet [oxycodone-acetaminophen], prilosec [omeprazole], rogaine [minoxidil], and victoza [liraglutide].  MEDICATIONS:  Current Outpatient Medications  Medication Sig Dispense Refill  . Aflibercept (EYLEA) 2 MG/0.05ML SOLN 2 mg by Intravitreal route every 3 (three) months.    Marland Kitchen albuterol (VENTOLIN HFA) 108 (90 Base) MCG/ACT inhaler Inhale two puffs every four to six hours as needed for cough or wheeze. 18 g 1  . amLODipine (NORVASC) 5 MG tablet Take 2.5 mg by mouth daily.     Marland Kitchen atorvastatin (LIPITOR) 40 MG tablet Take 1 tablet (40 mg total) by mouth daily at 6 PM. 30 tablet 0  . B-D ULTRAFINE III SHORT PEN 31G X 8 MM MISC SMARTSIG:1 Each SUB-Q Daily    . beclomethasone (QVAR) 40 MCG/ACT inhaler Inhale 2 puffs into the lungs 2 (two) times daily. (Patient taking differently: Inhale 2 puffs into the lungs 2 (two) times daily as needed. ) 10.6 g 5  . EPINEPHrine (EPIPEN 2-PAK) 0.3 mg/0.3 mL IJ SOAJ injection Use as directed for life-threatening allergic reaction. 2 each 1  . escitalopram (LEXAPRO) 20 MG tablet Take 20 mg by mouth daily.    Marland Kitchen ezetimibe (ZETIA) 10 MG tablet Take 1 tablet (10 mg total) by mouth daily.    . ferrous sulfate 325 (65 FE) MG tablet Take 365 mg by mouth daily.     . folic acid (FOLVITE) 1 MG tablet Take 1 mg by mouth 2 (two) times daily.     . hydrALAZINE (APRESOLINE) 25 MG tablet Take 25 mg by mouth 3 (three) times daily.     . Insulin Glargine (BASAGLAR KWIKPEN) 100 UNIT/ML SMARTSIG:72 Unit(s)  SUB-Q Daily    . irbesartan (AVAPRO) 150 MG tablet Take 1 tablet (150 mg total) by mouth daily.    Marland Kitchen latanoprost (XALATAN) 0.005 % ophthalmic solution 1 drop at bedtime.    Marland Kitchen levocetirizine (XYZAL) 5 MG tablet Take 1 tablet (5 mg total) by mouth every evening.    Marland Kitchen levothyroxine (SYNTHROID) 125 MCG tablet Take 125 mcg by mouth daily before breakfast. BRAND ONLY    . LORazepam (ATIVAN) 0.5 MG tablet Take 0.5 mg by mouth as needed for anxiety.    . metFORMIN (GLUCOPHAGE-XR) 500 MG 24 hr tablet Take 1,500 mg by mouth daily.    . methocarbamol (ROBAXIN) 500 MG tablet Take 1 tablet (500 mg total) by mouth 2 (two) times daily. 20 tablet 0  . Methotrexate Sodium (METHOTREXATE, PF,) 50 MG/2ML injection INJECT 0.8ML ONCE PER WEEK    . metoprolol succinate (TOPROL-XL) 25 MG 24 hr tablet Take 1 tablet (25 mg total) by mouth daily. 90 tablet 3  . Multiple Vitamins-Minerals (CENTRUM SILVER PO) Take 1 tablet by mouth daily.    . TRULICITY 3 XB/2.8UX SOPN Inject 0.75 mLs (4.5 mg total) into the skin once a week.    . vitamin B-12 (CYANOCOBALAMIN) 100 MCG tablet Take 100 mcg by mouth daily.     Marland Kitchen VITAMIN D, ERGOCALCIFEROL, PO Take 2,000 mg by mouth 2 (two) times daily.     No current facility-administered medications for this  visit.    PHYSICAL EXAMINATION: ECOG PERFORMANCE STATUS: 1 - Symptomatic but completely ambulatory  There were no vitals filed for this visit. There were no vitals filed for this visit.  LABORATORY DATA:  I have reviewed the data as listed CMP Latest Ref Rng & Units 10/07/2018 07/12/2018 07/11/2018  Glucose 65 - 99 mg/dL 123(H) 128(H) 143(H)  BUN 8 - 27 mg/dL 26 47(H) 43(H)  Creatinine 0.57 - 1.00 mg/dL 1.13(H) 1.35(H) 1.60(H)  Sodium 134 - 144 mmol/L 140 136 136  Potassium 3.5 - 5.2 mmol/L 5.1 4.6 4.3  Chloride 96 - 106 mmol/L 105 103 103  CO2 20 - 29 mmol/L 21 23 22   Calcium 8.7 - 10.3 mg/dL 9.4 8.7(L) 8.8(L)  Total Protein 6.5 - 8.1 g/dL - - -  Total Bilirubin 0.3 - 1.2  mg/dL - - -  Alkaline Phos 38 - 126 U/L - - -  AST 15 - 41 U/L - - -  ALT 0 - 44 U/L - - -    Lab Results  Component Value Date   WBC 7.1 06/17/2020   HGB 10.0 (L) 06/17/2020   HCT 33.5 (L) 06/17/2020   MCV 89.6 06/17/2020   PLT 197 06/17/2020   NEUTROABS 4.4 06/17/2020    ASSESSMENT & PLAN:  Normochromic normocytic anemia Patient has history of hypothyroidism as well as possible adrenal dysfunction issues for which she goes to [facility]  Previous blood work review: 1.Erythropoietin: 24.2 2. 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 Lab review 03/23/2020: Erythropoietin 27.9, hemoglobin 9.6 May 27, 2020: Hemoglobin 10.6 06/17/2020: Hemoglobin 10, MCV 89.6  She is going to need Aranesp injection today.  Return to clinic every 4 weeks for Aranesp injections and follow-up with me so that we can figure out how often she will need Aranesp injections.  We will plan to give her Aranesp as long as it is less than 10.4 hemoglobin. I will see her every 8 weeks for follow-up.    No orders of the defined types were placed in this encounter.  The patient has a good understanding of the overall plan. she agrees with it. she will call with any problems that may develop before the next visit here.  Total time spent: 30 mins including face to face time and time spent for planning, charting and coordination of care  Nicholas Lose, MD 06/17/2020  I, Cloyde Reams Dorshimer, am acting as scribe for Dr. Nicholas Lose.  I have  reviewed the above documentation for accuracy and completeness, and I agree with the above.

## 2020-06-17 ENCOUNTER — Ambulatory Visit: Payer: BC Managed Care – PPO

## 2020-06-17 ENCOUNTER — Inpatient Hospital Stay: Payer: BC Managed Care – PPO | Attending: Hematology and Oncology

## 2020-06-17 ENCOUNTER — Ambulatory Visit (INDEPENDENT_AMBULATORY_CARE_PROVIDER_SITE_OTHER): Payer: BC Managed Care – PPO | Admitting: *Deleted

## 2020-06-17 ENCOUNTER — Inpatient Hospital Stay: Payer: BC Managed Care – PPO

## 2020-06-17 ENCOUNTER — Other Ambulatory Visit: Payer: BC Managed Care – PPO

## 2020-06-17 ENCOUNTER — Inpatient Hospital Stay: Payer: BC Managed Care – PPO | Admitting: Hematology and Oncology

## 2020-06-17 ENCOUNTER — Telehealth: Payer: Self-pay | Admitting: Hematology and Oncology

## 2020-06-17 ENCOUNTER — Other Ambulatory Visit: Payer: Self-pay

## 2020-06-17 DIAGNOSIS — J309 Allergic rhinitis, unspecified: Secondary | ICD-10-CM | POA: Diagnosis not present

## 2020-06-17 DIAGNOSIS — D638 Anemia in other chronic diseases classified elsewhere: Secondary | ICD-10-CM

## 2020-06-17 DIAGNOSIS — N183 Chronic kidney disease, stage 3 unspecified: Secondary | ICD-10-CM

## 2020-06-17 DIAGNOSIS — N189 Chronic kidney disease, unspecified: Secondary | ICD-10-CM | POA: Diagnosis not present

## 2020-06-17 DIAGNOSIS — D631 Anemia in chronic kidney disease: Secondary | ICD-10-CM | POA: Insufficient documentation

## 2020-06-17 DIAGNOSIS — D649 Anemia, unspecified: Secondary | ICD-10-CM | POA: Diagnosis not present

## 2020-06-17 LAB — CBC WITH DIFFERENTIAL (CANCER CENTER ONLY)
Abs Immature Granulocytes: 0.02 10*3/uL (ref 0.00–0.07)
Basophils Absolute: 0 10*3/uL (ref 0.0–0.1)
Basophils Relative: 0 %
Eosinophils Absolute: 0.2 10*3/uL (ref 0.0–0.5)
Eosinophils Relative: 3 %
HCT: 33.5 % — ABNORMAL LOW (ref 36.0–46.0)
Hemoglobin: 10 g/dL — ABNORMAL LOW (ref 12.0–15.0)
Immature Granulocytes: 0 %
Lymphocytes Relative: 27 %
Lymphs Abs: 1.9 10*3/uL (ref 0.7–4.0)
MCH: 26.7 pg (ref 26.0–34.0)
MCHC: 29.9 g/dL — ABNORMAL LOW (ref 30.0–36.0)
MCV: 89.6 fL (ref 80.0–100.0)
Monocytes Absolute: 0.5 10*3/uL (ref 0.1–1.0)
Monocytes Relative: 7 %
Neutro Abs: 4.4 10*3/uL (ref 1.7–7.7)
Neutrophils Relative %: 63 %
Platelet Count: 197 10*3/uL (ref 150–400)
RBC: 3.74 MIL/uL — ABNORMAL LOW (ref 3.87–5.11)
RDW: 14.8 % (ref 11.5–15.5)
WBC Count: 7.1 10*3/uL (ref 4.0–10.5)
nRBC: 0 % (ref 0.0–0.2)

## 2020-06-17 MED ORDER — DARBEPOETIN ALFA 300 MCG/0.6ML IJ SOSY
300.0000 ug | PREFILLED_SYRINGE | Freq: Once | INTRAMUSCULAR | Status: AC
  Administered 2020-06-17: 300 ug via SUBCUTANEOUS

## 2020-06-17 MED ORDER — DARBEPOETIN ALFA 300 MCG/0.6ML IJ SOSY
PREFILLED_SYRINGE | INTRAMUSCULAR | Status: AC
  Filled 2020-06-17: qty 0.6

## 2020-06-17 NOTE — Assessment & Plan Note (Signed)
Patient has history of hypothyroidism as well as possible adrenal dysfunction issues for which she goes to [facility]  Previous blood work review: 1.Erythropoietin: 24.2 2. 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 Lab review 03/23/2020: Erythropoietin 27.9, hemoglobin 9.6 May 27, 2020: Hemoglobin 10.6 06/17/2020:  Therefore we are not giving her Aranesp injection today.  Return to clinic every 3 weeks for Aranesp injections and follow-up with me so that we can figure out how often she will need Aranesp injections.  We will plan to give her Aranesp as long as it is less than 10.4 hemoglobin.

## 2020-06-17 NOTE — Patient Instructions (Signed)
Darbepoetin Alfa injection What is this medicine? DARBEPOETIN ALFA (dar be POE e tin AL fa) helps your body make more red blood cells. It is used to treat anemia caused by chronic kidney failure and chemotherapy. This medicine may be used for other purposes; ask your health care provider or pharmacist if you have questions. COMMON BRAND NAME(S): Aranesp What should I tell my health care provider before I take this medicine? They need to know if you have any of these conditions:  blood clotting disorders or history of blood clots  cancer patient not on chemotherapy  cystic fibrosis  heart disease, such as angina, heart failure, or a history of a heart attack  hemoglobin level of 12 g/dL or greater  high blood pressure  low levels of folate, iron, or vitamin B12  seizures  an unusual or allergic reaction to darbepoetin, erythropoietin, albumin, hamster proteins, latex, other medicines, foods, dyes, or preservatives  pregnant or trying to get pregnant  breast-feeding How should I use this medicine? This medicine is for injection into a vein or under the skin. It is usually given by a health care professional in a hospital or clinic setting. If you get this medicine at home, you will be taught how to prepare and give this medicine. Use exactly as directed. Take your medicine at regular intervals. Do not take your medicine more often than directed. It is important that you put your used needles and syringes in a special sharps container. Do not put them in a trash can. If you do not have a sharps container, call your pharmacist or healthcare provider to get one. A special MedGuide will be given to you by the pharmacist with each prescription and refill. Be sure to read this information carefully each time. Talk to your pediatrician regarding the use of this medicine in children. While this medicine may be used in children as young as 1 month of age for selected conditions, precautions do  apply. Overdosage: If you think you have taken too much of this medicine contact a poison control center or emergency room at once. NOTE: This medicine is only for you. Do not share this medicine with others. What if I miss a dose? If you miss a dose, take it as soon as you can. If it is almost time for your next dose, take only that dose. Do not take double or extra doses. What may interact with this medicine? Do not take this medicine with any of the following medications:  epoetin alfa This list may not describe all possible interactions. Give your health care provider a list of all the medicines, herbs, non-prescription drugs, or dietary supplements you use. Also tell them if you smoke, drink alcohol, or use illegal drugs. Some items may interact with your medicine. What should I watch for while using this medicine? Your condition will be monitored carefully while you are receiving this medicine. You may need blood work done while you are taking this medicine. This medicine may cause a decrease in vitamin B6. You should make sure that you get enough vitamin B6 while you are taking this medicine. Discuss the foods you eat and the vitamins you take with your health care professional. What side effects may I notice from receiving this medicine? Side effects that you should report to your doctor or health care professional as soon as possible:  allergic reactions like skin rash, itching or hives, swelling of the face, lips, or tongue  breathing problems  changes in   vision  chest pain  confusion, trouble speaking or understanding  feeling faint or lightheaded, falls  high blood pressure  muscle aches or pains  pain, swelling, warmth in the leg  rapid weight gain  severe headaches  sudden numbness or weakness of the face, arm or leg  trouble walking, dizziness, loss of balance or coordination  seizures (convulsions)  swelling of the ankles, feet, hands  unusually weak or  tired Side effects that usually do not require medical attention (report to your doctor or health care professional if they continue or are bothersome):  diarrhea  fever, chills (flu-like symptoms)  headaches  nausea, vomiting  redness, stinging, or swelling at site where injected This list may not describe all possible side effects. Call your doctor for medical advice about side effects. You may report side effects to FDA at 1-800-FDA-1088. Where should I keep my medicine? Keep out of the reach of children. Store in a refrigerator between 2 and 8 degrees C (36 and 46 degrees F). Do not freeze. Do not shake. Throw away any unused portion if using a single-dose vial. Throw away any unused medicine after the expiration date. NOTE: This sheet is a summary. It may not cover all possible information. If you have questions about this medicine, talk to your doctor, pharmacist, or health care provider.  2020 [employer] Standard (2017-10-30 16:44:20)  

## 2020-06-17 NOTE — Telephone Encounter (Signed)
Scheduled appts per 8/20 los. Gave pt a print out of AVS.

## 2020-07-14 ENCOUNTER — Other Ambulatory Visit: Payer: Self-pay

## 2020-07-14 ENCOUNTER — Ambulatory Visit (INDEPENDENT_AMBULATORY_CARE_PROVIDER_SITE_OTHER): Payer: BC Managed Care – PPO | Admitting: *Deleted

## 2020-07-14 DIAGNOSIS — D649 Anemia, unspecified: Secondary | ICD-10-CM

## 2020-07-14 DIAGNOSIS — J309 Allergic rhinitis, unspecified: Secondary | ICD-10-CM

## 2020-07-15 ENCOUNTER — Inpatient Hospital Stay: Payer: BC Managed Care – PPO

## 2020-07-15 ENCOUNTER — Other Ambulatory Visit: Payer: Self-pay

## 2020-07-15 ENCOUNTER — Inpatient Hospital Stay: Payer: BC Managed Care – PPO | Attending: Hematology and Oncology

## 2020-07-15 VITALS — BP 139/63 | HR 69 | Temp 98.2°F | Resp 18

## 2020-07-15 DIAGNOSIS — D649 Anemia, unspecified: Secondary | ICD-10-CM

## 2020-07-15 DIAGNOSIS — N189 Chronic kidney disease, unspecified: Secondary | ICD-10-CM | POA: Insufficient documentation

## 2020-07-15 DIAGNOSIS — D631 Anemia in chronic kidney disease: Secondary | ICD-10-CM | POA: Insufficient documentation

## 2020-07-15 DIAGNOSIS — D638 Anemia in other chronic diseases classified elsewhere: Secondary | ICD-10-CM

## 2020-07-15 LAB — CBC WITH DIFFERENTIAL (CANCER CENTER ONLY)
Abs Immature Granulocytes: 0.01 10*3/uL (ref 0.00–0.07)
Basophils Absolute: 0 10*3/uL (ref 0.0–0.1)
Basophils Relative: 1 %
Eosinophils Absolute: 0.2 10*3/uL (ref 0.0–0.5)
Eosinophils Relative: 2 %
HCT: 35.1 % — ABNORMAL LOW (ref 36.0–46.0)
Hemoglobin: 10.3 g/dL — ABNORMAL LOW (ref 12.0–15.0)
Immature Granulocytes: 0 %
Lymphocytes Relative: 27 %
Lymphs Abs: 1.9 10*3/uL (ref 0.7–4.0)
MCH: 26.3 pg (ref 26.0–34.0)
MCHC: 29.3 g/dL — ABNORMAL LOW (ref 30.0–36.0)
MCV: 89.5 fL (ref 80.0–100.0)
Monocytes Absolute: 0.5 10*3/uL (ref 0.1–1.0)
Monocytes Relative: 7 %
Neutro Abs: 4.4 10*3/uL (ref 1.7–7.7)
Neutrophils Relative %: 63 %
Platelet Count: 203 10*3/uL (ref 150–400)
RBC: 3.92 MIL/uL (ref 3.87–5.11)
RDW: 15.7 % — ABNORMAL HIGH (ref 11.5–15.5)
WBC Count: 6.9 10*3/uL (ref 4.0–10.5)
nRBC: 0 % (ref 0.0–0.2)

## 2020-07-15 MED ORDER — DARBEPOETIN ALFA 300 MCG/0.6ML IJ SOSY
PREFILLED_SYRINGE | INTRAMUSCULAR | Status: AC
  Filled 2020-07-15: qty 0.6

## 2020-07-15 MED ORDER — DARBEPOETIN ALFA 300 MCG/0.6ML IJ SOSY
300.0000 ug | PREFILLED_SYRINGE | Freq: Once | INTRAMUSCULAR | Status: AC
  Administered 2020-07-15: 300 ug via SUBCUTANEOUS

## 2020-08-12 ENCOUNTER — Inpatient Hospital Stay: Payer: BC Managed Care – PPO | Admitting: Hematology and Oncology

## 2020-08-12 ENCOUNTER — Inpatient Hospital Stay: Payer: BC Managed Care – PPO

## 2020-08-12 ENCOUNTER — Ambulatory Visit (INDEPENDENT_AMBULATORY_CARE_PROVIDER_SITE_OTHER): Payer: BC Managed Care – PPO

## 2020-08-12 ENCOUNTER — Other Ambulatory Visit: Payer: Self-pay

## 2020-08-12 ENCOUNTER — Inpatient Hospital Stay: Payer: BC Managed Care – PPO | Attending: Hematology and Oncology

## 2020-08-12 DIAGNOSIS — E039 Hypothyroidism, unspecified: Secondary | ICD-10-CM | POA: Diagnosis not present

## 2020-08-12 DIAGNOSIS — N183 Chronic kidney disease, stage 3 unspecified: Secondary | ICD-10-CM

## 2020-08-12 DIAGNOSIS — J309 Allergic rhinitis, unspecified: Secondary | ICD-10-CM | POA: Diagnosis not present

## 2020-08-12 DIAGNOSIS — Z79899 Other long term (current) drug therapy: Secondary | ICD-10-CM | POA: Diagnosis not present

## 2020-08-12 DIAGNOSIS — D649 Anemia, unspecified: Secondary | ICD-10-CM

## 2020-08-12 DIAGNOSIS — D631 Anemia in chronic kidney disease: Secondary | ICD-10-CM

## 2020-08-12 LAB — CBC WITH DIFFERENTIAL (CANCER CENTER ONLY)
Abs Immature Granulocytes: 0.02 10*3/uL (ref 0.00–0.07)
Basophils Absolute: 0 10*3/uL (ref 0.0–0.1)
Basophils Relative: 1 %
Eosinophils Absolute: 0.3 10*3/uL (ref 0.0–0.5)
Eosinophils Relative: 3 %
HCT: 35.8 % — ABNORMAL LOW (ref 36.0–46.0)
Hemoglobin: 10.6 g/dL — ABNORMAL LOW (ref 12.0–15.0)
Immature Granulocytes: 0 %
Lymphocytes Relative: 25 %
Lymphs Abs: 1.9 10*3/uL (ref 0.7–4.0)
MCH: 26.7 pg (ref 26.0–34.0)
MCHC: 29.6 g/dL — ABNORMAL LOW (ref 30.0–36.0)
MCV: 90.2 fL (ref 80.0–100.0)
Monocytes Absolute: 0.5 10*3/uL (ref 0.1–1.0)
Monocytes Relative: 6 %
Neutro Abs: 5 10*3/uL (ref 1.7–7.7)
Neutrophils Relative %: 65 %
Platelet Count: 210 10*3/uL (ref 150–400)
RBC: 3.97 MIL/uL (ref 3.87–5.11)
RDW: 15.7 % — ABNORMAL HIGH (ref 11.5–15.5)
WBC Count: 7.6 10*3/uL (ref 4.0–10.5)
nRBC: 0 % (ref 0.0–0.2)

## 2020-08-12 MED ORDER — BASAGLAR KWIKPEN 100 UNIT/ML ~~LOC~~ SOPN: 45.0000 [IU] | PEN_INJECTOR | Freq: Every day | SUBCUTANEOUS | Status: DC

## 2020-08-12 MED ORDER — NOVOLOG 70/30 FLEXPEN RELION (70-30) 100 UNIT/ML ~~LOC~~ SUPN: 15.0000 [IU] | PEN_INJECTOR | Freq: Three times a day (TID) | SUBCUTANEOUS | 11 refills | Status: DC

## 2020-08-12 MED ORDER — ASPIRIN EC 81 MG PO TBEC
81.0000 mg | DELAYED_RELEASE_TABLET | Freq: Every day | ORAL | 11 refills | Status: DC
Start: 2020-08-12 — End: 2022-08-22

## 2020-08-12 NOTE — Progress Notes (Signed)
Patient Care Team: Deland Pretty, MD as PCP - General (Internal Medicine)  DIAGNOSIS:  Encounter Diagnosis  Name Primary?  Marland Kitchen Anemia in stage 3 chronic kidney disease, unspecified whether stage 3a or 3b CKD (HCC)     CHIEF COMPLIANT: Follow-up of anemia of chronic kidney disease on Aranesp injections  INTERVAL HISTORY: ROSAN CALBERT is a 64 year old above-mentioned history of anemia of chronic kidney disease who is currently receiving Aranesp injections.  She has received 3 injections so far since July.  She is tolerating the shots very well.  She does feel somewhat tired periodically but overall she is doing relatively well.   ALLERGIES:  is allergic to jardiance [empagliflozin], latex, invokana [canagliflozin], percocet [oxycodone-acetaminophen], prilosec [omeprazole], rogaine [minoxidil], and victoza [liraglutide].  MEDICATIONS:  Current Outpatient Medications  Medication Sig Dispense Refill  . Aflibercept (EYLEA) 2 MG/0.05ML SOLN 2 mg by Intravitreal route every 3 (three) months.    Marland Kitchen albuterol (VENTOLIN HFA) 108 (90 Base) MCG/ACT inhaler Inhale two puffs every four to six hours as needed for cough or wheeze. 18 g 1  . amLODipine (NORVASC) 5 MG tablet Take 2.5 mg by mouth daily.     Marland Kitchen atorvastatin (LIPITOR) 40 MG tablet Take 1 tablet (40 mg total) by mouth daily at 6 PM. 30 tablet 0  . B-D ULTRAFINE III SHORT PEN 31G X 8 MM MISC SMARTSIG:1 Each SUB-Q Daily    . beclomethasone (QVAR) 40 MCG/ACT inhaler Inhale 2 puffs into the lungs 2 (two) times daily. (Patient taking differently: Inhale 2 puffs into the lungs 2 (two) times daily as needed. ) 10.6 g 5  . EPINEPHrine (EPIPEN 2-PAK) 0.3 mg/0.3 mL IJ SOAJ injection Use as directed for life-threatening allergic reaction. 2 each 1  . escitalopram (LEXAPRO) 20 MG tablet Take 20 mg by mouth daily.    Marland Kitchen ezetimibe (ZETIA) 10 MG tablet Take 1 tablet (10 mg total) by mouth daily.    . ferrous sulfate 325 (65 FE) MG tablet Take 365 mg by mouth  daily.     . folic acid (FOLVITE) 1 MG tablet Take 1 mg by mouth 2 (two) times daily.     . hydrALAZINE (APRESOLINE) 25 MG tablet Take 25 mg by mouth 3 (three) times daily.     . Insulin Glargine (BASAGLAR KWIKPEN) 100 UNIT/ML SMARTSIG:72 Unit(s) SUB-Q Daily    . irbesartan (AVAPRO) 150 MG tablet Take 1 tablet (150 mg total) by mouth daily.    Marland Kitchen latanoprost (XALATAN) 0.005 % ophthalmic solution 1 drop at bedtime.    Marland Kitchen levocetirizine (XYZAL) 5 MG tablet Take 1 tablet (5 mg total) by mouth every evening.    Marland Kitchen levothyroxine (SYNTHROID) 125 MCG tablet Take 125 mcg by mouth daily before breakfast. BRAND ONLY    . LORazepam (ATIVAN) 0.5 MG tablet Take 0.5 mg by mouth as needed for anxiety.    . metFORMIN (GLUCOPHAGE-XR) 500 MG 24 hr tablet Take 1,500 mg by mouth daily.    . methocarbamol (ROBAXIN) 500 MG tablet Take 1 tablet (500 mg total) by mouth 2 (two) times daily. 20 tablet 0  . Methotrexate Sodium (METHOTREXATE, PF,) 50 MG/2ML injection INJECT 0.8ML ONCE PER WEEK    . metoprolol succinate (TOPROL-XL) 25 MG 24 hr tablet Take 1 tablet (25 mg total) by mouth daily. 90 tablet 3  . Multiple Vitamins-Minerals (CENTRUM SILVER PO) Take 1 tablet by mouth daily.    . TRULICITY 3 MH/9.6QI SOPN Inject 0.75 mLs (4.5 mg total) into the skin once  a week.    . vitamin B-12 (CYANOCOBALAMIN) 100 MCG tablet Take 100 mcg by mouth daily.     Marland Kitchen VITAMIN D, ERGOCALCIFEROL, PO Take 2,000 mg by mouth 2 (two) times daily.     No current facility-administered medications for this visit.    PHYSICAL EXAMINATION: ECOG PERFORMANCE STATUS: 1 - Symptomatic but completely ambulatory  Vitals:   08/12/20 0837  BP: 140/67  Pulse: 70  Resp: 18  Temp: (!) 97.1 F (36.2 C)  SpO2: 98%   Filed Weights   08/12/20 0837  Weight: 245 lb 9.6 oz (111.4 kg)      LABORATORY DATA:  I have reviewed the data as listed CMP Latest Ref Rng & Units 10/07/2018 07/12/2018 07/11/2018  Glucose 65 - 99 mg/dL 123(H) 128(H) 143(H)  BUN 8  - 27 mg/dL 26 47(H) 43(H)  Creatinine 0.57 - 1.00 mg/dL 1.13(H) 1.35(H) 1.60(H)  Sodium 134 - 144 mmol/L 140 136 136  Potassium 3.5 - 5.2 mmol/L 5.1 4.6 4.3  Chloride 96 - 106 mmol/L 105 103 103  CO2 20 - 29 mmol/L 21 23 22   Calcium 8.7 - 10.3 mg/dL 9.4 8.7(L) 8.8(L)  Total Protein 6.5 - 8.1 g/dL - - -  Total Bilirubin 0.3 - 1.2 mg/dL - - -  Alkaline Phos 38 - 126 U/L - - -  AST 15 - 41 U/L - - -  ALT 0 - 44 U/L - - -    Lab Results  Component Value Date   WBC 7.6 08/12/2020   HGB 10.6 (L) 08/12/2020   HCT 35.8 (L) 08/12/2020   MCV 90.2 08/12/2020   PLT 210 08/12/2020   NEUTROABS 5.0 08/12/2020    ASSESSMENT & PLAN:  Anemia in chronic kidney disease Patient has history of hypothyroidism as well as possible adrenal dysfunction issues for which she goes to [facility]  Previous blood work review: 1.Erythropoietin: 24.2 2. 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 Lab review 03/23/2020: Erythropoietin 27.9, hemoglobin 9.6 May 27, 2020:Hemoglobin 10.6 06/17/2020: Hemoglobin 10, MCV 89.6 07/15/2020: Hemoglobin 10.3 08/12/2020: Hb 10.6  Patient will return to 6 weeks for Aranesp injections with labs. We will plan to give her Aranesp as long as it is less than 10.4 hemoglobin. I will see her back in February.    No orders of the defined types were placed in this encounter.  The patient has a good understanding of the overall plan. she agrees with it. she will call with any problems that may develop before  the next visit here. Total time spent: 30 mins including face to face time and time spent for planning, charting and co-ordination of care   Harriette Ohara, MD 08/12/20

## 2020-08-12 NOTE — Assessment & Plan Note (Signed)
Patient has history of hypothyroidism as well as possible adrenal dysfunction issues for which she goes to [facility]  Previous blood work review: 1.Erythropoietin: 24.2 2. 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 Lab review 03/23/2020: Erythropoietin 27.9, hemoglobin 9.6 May 27, 2020:Hemoglobin 10.6 06/17/2020: Hemoglobin 10, MCV 89.6 07/15/2020: Hemoglobin 10.3 08/12/2020:  Patient will return monthly for Aranesp injections with labs. We will plan to give her Aranesp as long as it is less than 10.4 hemoglobin. I will see her back in 3 months for labs and follow-up

## 2020-08-19 ENCOUNTER — Ambulatory Visit (INDEPENDENT_AMBULATORY_CARE_PROVIDER_SITE_OTHER): Payer: BC Managed Care – PPO | Admitting: *Deleted

## 2020-08-19 DIAGNOSIS — J309 Allergic rhinitis, unspecified: Secondary | ICD-10-CM | POA: Diagnosis not present

## 2020-08-24 ENCOUNTER — Telehealth: Payer: Self-pay

## 2020-08-24 NOTE — Telephone Encounter (Signed)
   [facility] Pre-operative Risk Assessment    HEARTCARE STAFF: - Please ensure there is not already an duplicate clearance open for this procedure. - Under Visit Info/Reason for Call, type in Other and utilize the format Clearance MM/DD/YY or Clearance TBD. Do not use dashes or single digits. - If request is for dental extraction, please clarify the # of teeth to be extracted.  Request for surgical clearance:  1. What type of surgery is being performed? EGD and Colonoscopy   2. When is this surgery scheduled? 09/27/20   3. What type of clearance is required (medical clearance vs. Pharmacy clearance to hold med vs. Both)? Medical  4. Are there any medications that need to be held prior to surgery and how long?Aspirin  5. Practice name and name of physician performing surgery? Conway Benson Norway, MD  6. What is the office phone number? [phone]   7.   What is the office fax number? [phone]  8.   Anesthesia type (None, local, MAC, general) ? Propofol   Monia Pouch 08/24/2020, 3:32 PM  _________________________________________________________________   (provider comments below)

## 2020-08-25 ENCOUNTER — Ambulatory Visit (INDEPENDENT_AMBULATORY_CARE_PROVIDER_SITE_OTHER): Payer: BC Managed Care – PPO

## 2020-08-25 DIAGNOSIS — J309 Allergic rhinitis, unspecified: Secondary | ICD-10-CM | POA: Diagnosis not present

## 2020-08-25 NOTE — Telephone Encounter (Signed)
   Primary Cardiologist: Pixie Casino, MD  Chart reviewed as part of pre-operative protocol coverage. Patient was contacted 08/25/2020 in reference to pre-operative risk assessment for pending surgery as outlined below.  Jocelyn Kaufman was last seen on 02/15/20 by Dr. Debara Pickett.  Since that day, Jocelyn Kaufman has done fine from a cardiac standpoint. She continues to have palpitations, though they are not increasing in frequency/duration since her last visit. She has some chronic DOE which is unchanged. No complaints of exertional chest pain. She can complete 4 METs without anginal complaints.   Therefore, based on ACC/AHA guidelines, the patient would be at acceptable risk for the planned procedure without further cardiovascular testing.   No cardiac contraindications to holding aspirin 7 days prior to her upcoming procedure. 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   The patient was advised that if she develops new symptoms prior to surgery to contact our office to arrange for a follow-up visit, and she verbalized understanding.  I will route this recommendation to the requesting party via Epic fax function and remove from pre-op pool. Please call with questions.  Abigail Butts, PA-C 08/25/2020, 11:04 AM

## 2020-08-29 DIAGNOSIS — R42 Dizziness and giddiness: Secondary | ICD-10-CM | POA: Insufficient documentation

## 2020-08-29 DIAGNOSIS — H9193 Unspecified hearing loss, bilateral: Secondary | ICD-10-CM | POA: Insufficient documentation

## 2020-09-04 IMAGING — DX DG HUMERUS 2V *R*
2 series · 2 of 2 positions shown · non-contrast
Comparison: None.

CLINICAL DATA: Fall.  Right upper arm pain.  Initial encounter.

EXAM:
RIGHT HUMERUS - 2+ VIEW

[humerus ap]
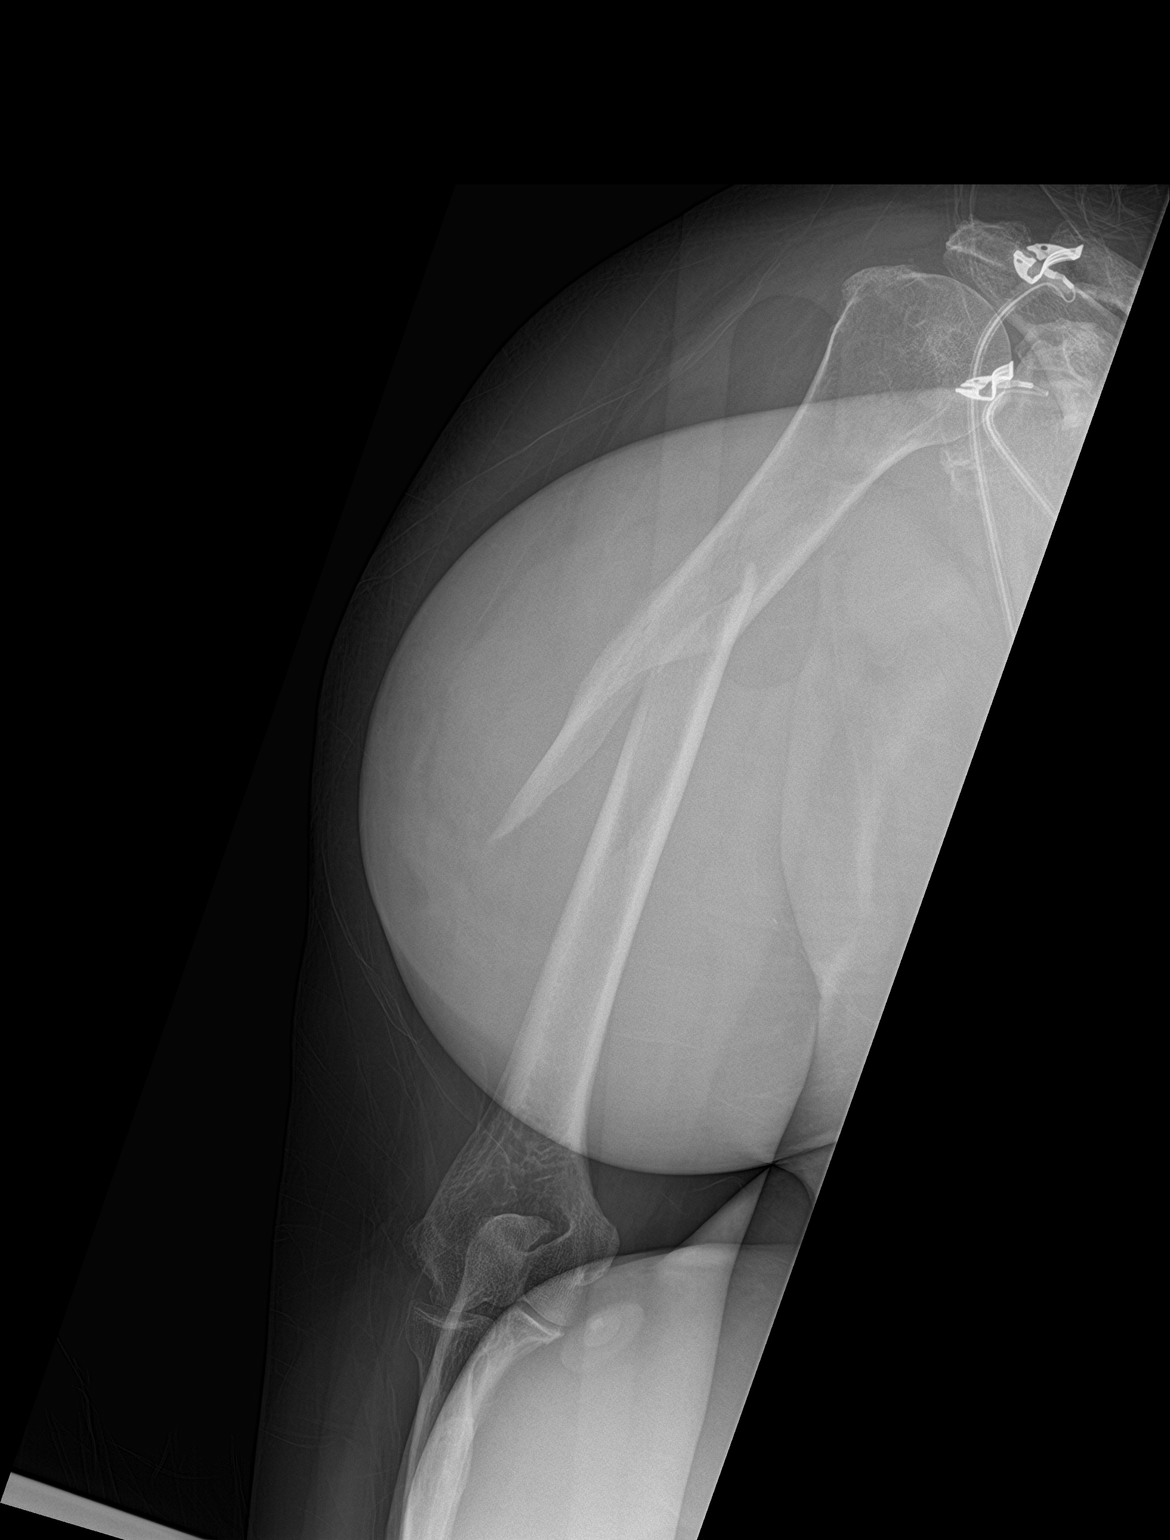

[humerus lat]
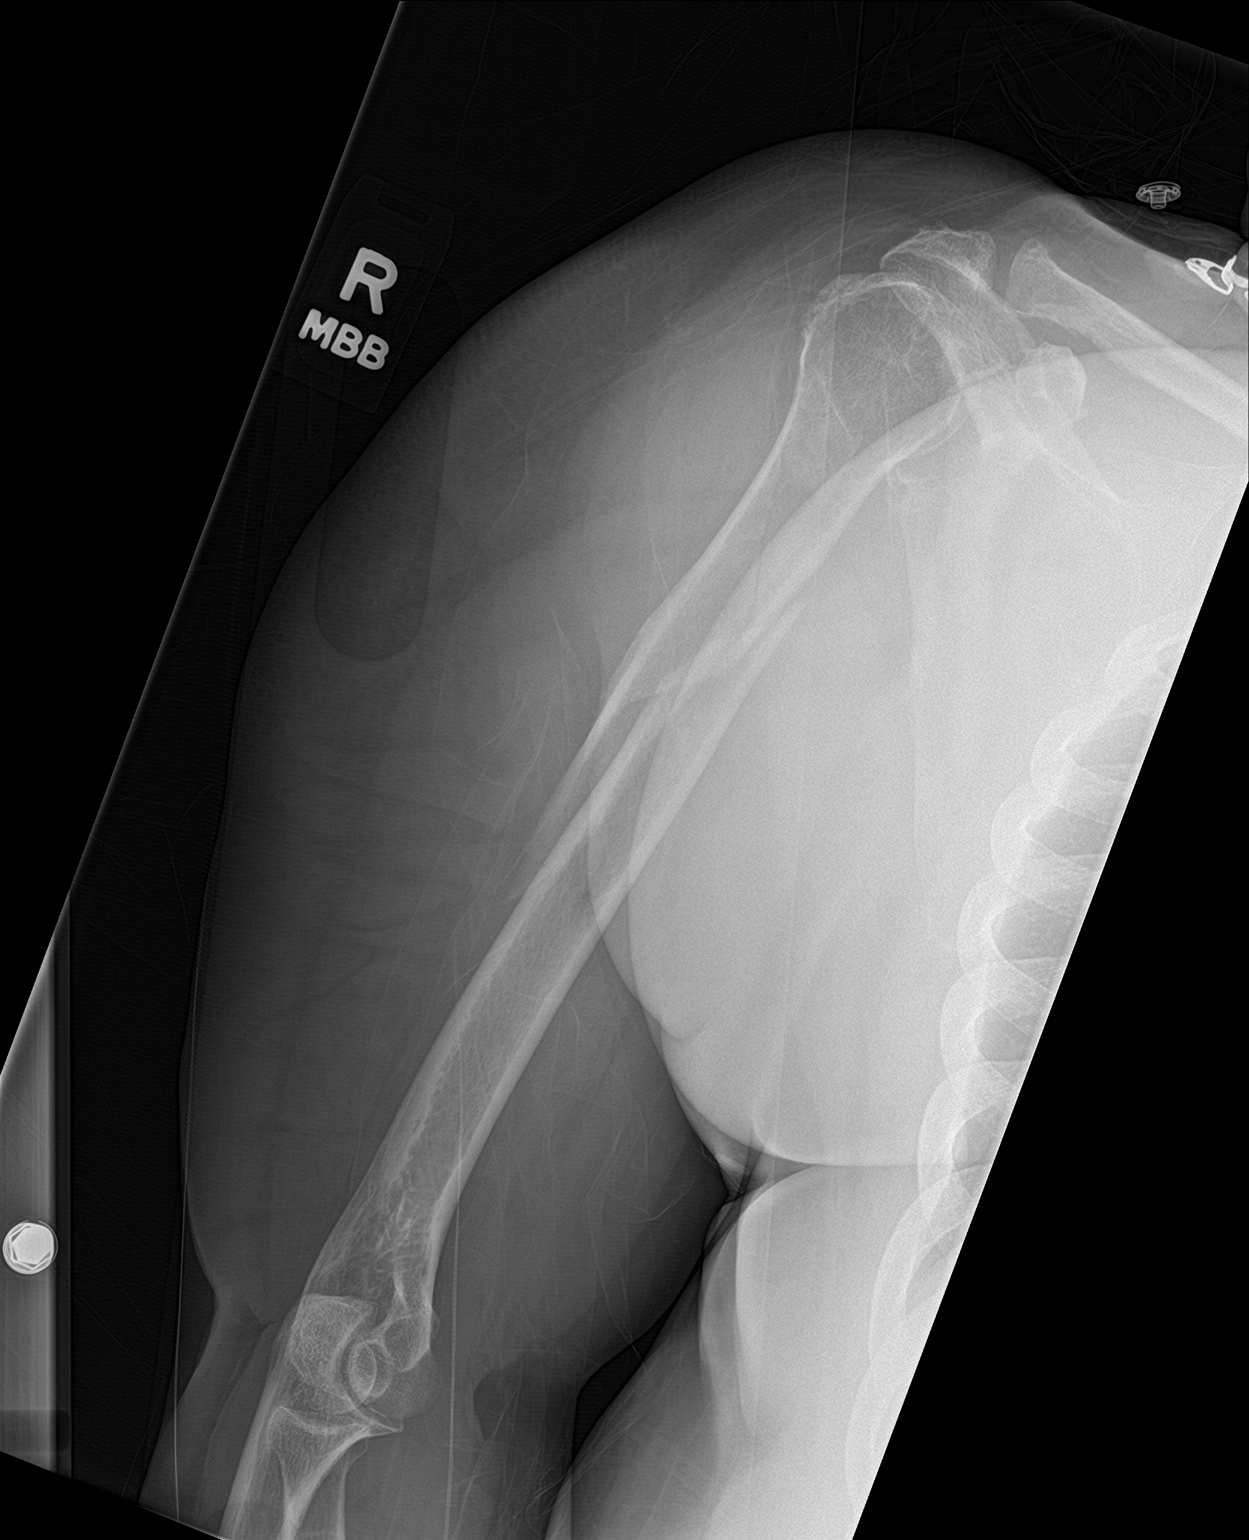

[2 of 2 positions shown; findings below may reference images not displayed]

FINDINGS: A spiral fracture of the humeral diaphysis is seen with medial
displacement and angulation of the distal fracture fragment.
Generalized osteopenia noted.
IMPRESSION: Displaced spiral fracture of the humeral diaphysis.

## 2020-09-05 ENCOUNTER — Ambulatory Visit (INDEPENDENT_AMBULATORY_CARE_PROVIDER_SITE_OTHER): Payer: BC Managed Care – PPO | Admitting: *Deleted

## 2020-09-05 DIAGNOSIS — J309 Allergic rhinitis, unspecified: Secondary | ICD-10-CM | POA: Diagnosis not present

## 2020-09-09 ENCOUNTER — Other Ambulatory Visit: Payer: Self-pay

## 2020-09-09 ENCOUNTER — Inpatient Hospital Stay: Payer: BC Managed Care – PPO | Attending: Hematology and Oncology

## 2020-09-09 ENCOUNTER — Inpatient Hospital Stay: Payer: BC Managed Care – PPO

## 2020-09-09 VITALS — BP 136/62 | HR 79 | Temp 98.7°F | Resp 18

## 2020-09-09 DIAGNOSIS — N183 Chronic kidney disease, stage 3 unspecified: Secondary | ICD-10-CM | POA: Insufficient documentation

## 2020-09-09 DIAGNOSIS — D631 Anemia in chronic kidney disease: Secondary | ICD-10-CM

## 2020-09-09 DIAGNOSIS — D649 Anemia, unspecified: Secondary | ICD-10-CM

## 2020-09-09 DIAGNOSIS — D638 Anemia in other chronic diseases classified elsewhere: Secondary | ICD-10-CM

## 2020-09-09 LAB — CBC WITH DIFFERENTIAL (CANCER CENTER ONLY)
Abs Immature Granulocytes: 0.02 10*3/uL (ref 0.00–0.07)
Basophils Absolute: 0 10*3/uL (ref 0.0–0.1)
Basophils Relative: 1 %
Eosinophils Absolute: 0.2 10*3/uL (ref 0.0–0.5)
Eosinophils Relative: 2 %
HCT: 32.4 % — ABNORMAL LOW (ref 36.0–46.0)
Hemoglobin: 9.6 g/dL — ABNORMAL LOW (ref 12.0–15.0)
Immature Granulocytes: 0 %
Lymphocytes Relative: 27 %
Lymphs Abs: 1.8 10*3/uL (ref 0.7–4.0)
MCH: 26.4 pg (ref 26.0–34.0)
MCHC: 29.6 g/dL — ABNORMAL LOW (ref 30.0–36.0)
MCV: 89.3 fL (ref 80.0–100.0)
Monocytes Absolute: 0.4 10*3/uL (ref 0.1–1.0)
Monocytes Relative: 7 %
Neutro Abs: 4.3 10*3/uL (ref 1.7–7.7)
Neutrophils Relative %: 63 %
Platelet Count: 199 10*3/uL (ref 150–400)
RBC: 3.63 MIL/uL — ABNORMAL LOW (ref 3.87–5.11)
RDW: 15.6 % — ABNORMAL HIGH (ref 11.5–15.5)
WBC Count: 6.8 10*3/uL (ref 4.0–10.5)
nRBC: 0 % (ref 0.0–0.2)

## 2020-09-09 MED ORDER — DARBEPOETIN ALFA 300 MCG/0.6ML IJ SOSY
PREFILLED_SYRINGE | INTRAMUSCULAR | Status: AC
  Filled 2020-09-09: qty 0.6

## 2020-09-09 MED ORDER — DARBEPOETIN ALFA 300 MCG/0.6ML IJ SOSY
300.0000 ug | PREFILLED_SYRINGE | Freq: Once | INTRAMUSCULAR | Status: AC
  Administered 2020-09-09: 300 ug via SUBCUTANEOUS

## 2020-09-13 ENCOUNTER — Ambulatory Visit (INDEPENDENT_AMBULATORY_CARE_PROVIDER_SITE_OTHER): Payer: BC Managed Care – PPO

## 2020-09-13 DIAGNOSIS — J309 Allergic rhinitis, unspecified: Secondary | ICD-10-CM

## 2020-10-06 ENCOUNTER — Encounter: Payer: Self-pay | Admitting: Allergy

## 2020-10-06 ENCOUNTER — Ambulatory Visit (INDEPENDENT_AMBULATORY_CARE_PROVIDER_SITE_OTHER): Payer: BC Managed Care – PPO | Admitting: Allergy

## 2020-10-06 ENCOUNTER — Other Ambulatory Visit: Payer: Self-pay

## 2020-10-06 VITALS — BP 142/68 | HR 68 | Temp 97.3°F | Resp 14 | Ht 64.5 in | Wt 244.0 lb

## 2020-10-06 DIAGNOSIS — J4520 Mild intermittent asthma, uncomplicated: Secondary | ICD-10-CM

## 2020-10-06 DIAGNOSIS — Z91038 Other insect allergy status: Secondary | ICD-10-CM | POA: Diagnosis not present

## 2020-10-06 DIAGNOSIS — J3089 Other allergic rhinitis: Secondary | ICD-10-CM | POA: Diagnosis not present

## 2020-10-06 NOTE — Progress Notes (Signed)
Follow-up Note  RE: TERSA FOTOPOULOS MRN: [id number] DOB: Jun 30, 1956 Date of Office Visit: 10/06/2020   History of present illness: Jocelyn Kaufman is a 64 y.o. female presenting today for follow-up of asthma and allergies.  She was last seen in the office on 04/06/20 by myself.  She states she did have asthma symptoms about a week before Thanksgiving and another episode after thanksgiving.  She states she was noting shortness of breath, wheezing.  She did use the Qvar once with the episode after thanksgiving.  Did use albuterol with the first episode.  Otherwise has not used albuterol or Qvar in any other time since the last visit.  She did get relief of symptoms with the inhaler use.  Denies any nighttime awakenings.  No ED or urgent care visits or any systemic steroid needs.  She does still note symptoms including cough when approaching her monthly allergy immunotherapy injection.  She did switch to Xyzal after the last visit and does feel like this is more effective than the Zyrtec was.  Since the last visit she was stung by some stinging insects 4 times.  She states she was on her porch and reached over to grab something in the next was nearby and they came out and stung her.  She states she was stung on her forehead, her neck, and 2 places on her arm.  She had localized swelling at these sites.  She also states she felt like she was having intermittent throat tightening symptoms.  She did use her epinephrine for this.  She did see her PCP a next day and received a steroid injection.  Review of systems: Review of Systems  Constitutional: Negative.   HENT: Negative.   Eyes: Negative.   Respiratory: Positive for cough.   Cardiovascular: Negative.   Gastrointestinal: Negative.   Musculoskeletal: Negative.   Skin: Negative.   Neurological: Negative.     All other systems negative unless noted above in HPI  Past medical/social/surgical/family history have been reviewed and are unchanged  unless specifically indicated below.  No changes  Medication List: Current Outpatient Medications  Medication Sig Dispense Refill  . Aflibercept (EYLEA) 2 MG/0.05ML SOLN 2 mg by Intravitreal route every 3 (three) months.    Marland Kitchen albuterol (VENTOLIN HFA) 108 (90 Base) MCG/ACT inhaler Inhale two puffs every four to six hours as needed for cough or wheeze. 18 g 1  . amLODipine (NORVASC) 5 MG tablet Take 2.5 mg by mouth daily.     Marland Kitchen aspirin EC 81 MG tablet Take 1 tablet (81 mg total) by mouth daily. Swallow whole. 30 tablet 11  . atorvastatin (LIPITOR) 40 MG tablet Take 1 tablet (40 mg total) by mouth daily at 6 PM. 30 tablet 0  . B-D ULTRAFINE III SHORT PEN 31G X 8 MM MISC SMARTSIG:1 Each SUB-Q Daily    . beclomethasone (QVAR) 40 MCG/ACT inhaler Inhale 2 puffs into the lungs 2 (two) times daily. (Patient taking differently: Inhale 2 puffs into the lungs 2 (two) times daily as needed.) 10.6 g 5  . EPINEPHrine (EPIPEN 2-PAK) 0.3 mg/0.3 mL IJ SOAJ injection Use as directed for life-threatening allergic reaction. 2 each 1  . escitalopram (LEXAPRO) 20 MG tablet Take 20 mg by mouth daily.    Marland Kitchen ezetimibe (ZETIA) 10 MG tablet Take 1 tablet (10 mg total) by mouth daily.    . ferrous sulfate 325 (65 FE) MG tablet Take 365 mg by mouth daily.     . folic  acid (FOLVITE) 1 MG tablet Take 1 mg by mouth 2 (two) times daily.     . hydrALAZINE (APRESOLINE) 25 MG tablet Take 25 mg by mouth 3 (three) times daily.     . insulin aspart protamine - aspart (NOVOLOG 70/30 FLEXPEN RELION) (70-30) 100 UNIT/ML FlexPen Inject 0.15-0.2 mLs (15-20 Units total) into the skin in the morning, at noon, and at bedtime. 15 mL 11  . Insulin Glargine (BASAGLAR KWIKPEN) 100 UNIT/ML Inject 45 Units into the skin daily.    . irbesartan (AVAPRO) 150 MG tablet Take 1 tablet (150 mg total) by mouth daily.    Marland Kitchen latanoprost (XALATAN) 0.005 % ophthalmic solution 1 drop at bedtime.    Marland Kitchen levocetirizine (XYZAL) 5 MG tablet Take 1 tablet (5 mg  total) by mouth every evening.    Marland Kitchen levothyroxine (SYNTHROID) 125 MCG tablet Take 125 mcg by mouth daily before breakfast. BRAND ONLY    . LORazepam (ATIVAN) 0.5 MG tablet Take 0.5 mg by mouth as needed for anxiety.    . metFORMIN (GLUCOPHAGE-XR) 500 MG 24 hr tablet Take 1,500 mg by mouth daily.    . Methotrexate Sodium (METHOTREXATE, PF,) 50 MG/2ML injection INJECT 0.8ML ONCE PER WEEK    . metoprolol succinate (TOPROL-XL) 25 MG 24 hr tablet Take 1 tablet (25 mg total) by mouth daily. 90 tablet 3  . Multiple Vitamins-Minerals (CENTRUM SILVER PO) Take 1 tablet by mouth daily.    . TRULICITY 3 CH/8.8FO SOPN Inject 0.75 mLs (4.5 mg total) into the skin once a week.    . vitamin B-12 (CYANOCOBALAMIN) 100 MCG tablet Take 100 mcg by mouth daily.     Marland Kitchen VITAMIN D, ERGOCALCIFEROL, PO Take 2,000 mg by mouth 2 (two) times daily.     No current facility-administered medications for this visit.     Known medication allergies: Allergies  Allergen Reactions  . Jardiance [Empagliflozin] Itching and Swelling  . Latex   . Invokana [Canagliflozin] Other (See Comments)    Urinary incontinence  . Percocet [Oxycodone-Acetaminophen] Nausea And Vomiting  . Prilosec [Omeprazole] Rash  . Rogaine [Minoxidil] Rash  . Victoza [Liraglutide] Itching     Physical examination: Blood pressure (!) 142/68, pulse 68, temperature (!) 97.3 F (36.3 C), resp. rate 14, height 5' 4.5" (1.638 m), weight 244 lb (110.7 kg), SpO2 98 %.  General: Alert, interactive, in no acute distress. HEENT: PERRLA, TMs pearly gray, turbinates non-edematous without discharge, post-pharynx non erythematous. Neck: Supple without lymphadenopathy. Lungs: Clear to auscultation without wheezing, rhonchi or rales. {no increased work of breathing. CV: Normal S1, S2 without murmurs. Abdomen: Nondistended, nontender. Skin: Warm and dry, without lesions or rashes. Extremities:  No clubbing, cyanosis or edema. Neuro:   Grossly  intact.  Diagnositics/Labs:  Spirometry: FEV1: 1.66L 80%, FVC: 2.14L 80%, ratio consistent with Nonobstructive pattern  Allergen immunotherapy injections provided today  Assessment and plan:  Mild intermittent asthma -at this time remains under good control.   Discussed again today if symptoms become more frequent and not meeting the below goals then Qvar should become a daily controller medication. Allergic rhinitis -continue allergen immunotherapy monthly as still develops symptoms within the week prior to her monthly injection.  Has seen an improvement in symptom control with change to Xyzal. 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 Continue Qvar Redihaler 2 puffs twice a day as needed.  If you are not meeting the below goals let me know  and take Qvar daily at that time for improved control.    2.  Continue Albuterol HFA 2 puffs every 4-6 hours if needed for cough, wheezing, shortness of breath or chest tightness.  Monitor frequency of use.   3. Consider nasal saline spray/gel for nasal symptoms  4. OTC antihistamine- Xyzal 5mg  daily   5. Continue immunotherapy monthly and access to [place] or Epipen  Respiratory control goals:   Full participation in all desired activities (may need albuterol before activity)  Albuterol use two time or less a week on average (not counting use with activity)  Cough interfering with sleep two time or less a month  Oral steroids no more than once a year  No hospitalizations  6. Lung function looks great today  7. Return to clinic in 6 months or earlier if problem   I appreciate the opportunity to take part in [place] care. Please do not hesitate to contact me with questions.  Sincerely,   Prudy Feeler, MD Allergy/Immunology [facility]

## 2020-10-06 NOTE — Patient Instructions (Addendum)
   1. Continue Qvar Redihaler 2 puffs twice a day as needed.  If you are not meeting the below goals let me know and take Qvar daily at that time for improved control.    2.  Continue Albuterol HFA 2 puffs every 4-6 hours if needed for cough, wheezing, shortness of breath or chest tightness.  Monitor frequency of use.   3. Consider nasal saline spray/gel for nasal symptoms  4. OTC antihistamine- Xyzal 5mg  daily   5. Continue immunotherapy monthly and access to [place] or Epipen  Respiratory control goals:   Full participation in all desired activities (may need albuterol before activity)  Albuterol use two time or less a week on average (not counting use with activity)  Cough interfering with sleep two time or less a month  Oral steroids no more than once a year  No hospitalizations  6. Lung function looks great today  7. Return to clinic in 6 months or earlier if problem

## 2020-10-07 ENCOUNTER — Other Ambulatory Visit: Payer: BC Managed Care – PPO

## 2020-10-07 ENCOUNTER — Ambulatory Visit: Payer: BC Managed Care – PPO

## 2020-10-27 ENCOUNTER — Other Ambulatory Visit: Payer: Self-pay

## 2020-10-27 ENCOUNTER — Telehealth: Payer: Self-pay | Admitting: *Deleted

## 2020-10-27 ENCOUNTER — Inpatient Hospital Stay: Payer: BC Managed Care – PPO | Attending: Hematology and Oncology

## 2020-10-27 ENCOUNTER — Other Ambulatory Visit: Payer: Self-pay | Admitting: *Deleted

## 2020-10-27 ENCOUNTER — Inpatient Hospital Stay: Payer: BC Managed Care – PPO

## 2020-10-27 ENCOUNTER — Other Ambulatory Visit: Payer: Self-pay | Admitting: Adult Health

## 2020-10-27 VITALS — BP 142/60 | HR 94 | Temp 99.2°F | Resp 19 | Ht 64.5 in

## 2020-10-27 DIAGNOSIS — D649 Anemia, unspecified: Secondary | ICD-10-CM

## 2020-10-27 DIAGNOSIS — D631 Anemia in chronic kidney disease: Secondary | ICD-10-CM | POA: Insufficient documentation

## 2020-10-27 DIAGNOSIS — D638 Anemia in other chronic diseases classified elsewhere: Secondary | ICD-10-CM

## 2020-10-27 DIAGNOSIS — N183 Chronic kidney disease, stage 3 unspecified: Secondary | ICD-10-CM | POA: Diagnosis not present

## 2020-10-27 LAB — CBC WITH DIFFERENTIAL (CANCER CENTER ONLY)
Abs Immature Granulocytes: 0.06 10*3/uL (ref 0.00–0.07)
Basophils Absolute: 0 10*3/uL (ref 0.0–0.1)
Basophils Relative: 0 %
Eosinophils Absolute: 0.1 10*3/uL (ref 0.0–0.5)
Eosinophils Relative: 1 %
HCT: 29.7 % — ABNORMAL LOW (ref 36.0–46.0)
Hemoglobin: 9.1 g/dL — ABNORMAL LOW (ref 12.0–15.0)
Immature Granulocytes: 1 %
Lymphocytes Relative: 16 %
Lymphs Abs: 1.6 10*3/uL (ref 0.7–4.0)
MCH: 27.6 pg (ref 26.0–34.0)
MCHC: 30.6 g/dL (ref 30.0–36.0)
MCV: 90 fL (ref 80.0–100.0)
Monocytes Absolute: 0.6 10*3/uL (ref 0.1–1.0)
Monocytes Relative: 6 %
Neutro Abs: 7.6 10*3/uL (ref 1.7–7.7)
Neutrophils Relative %: 76 %
Platelet Count: 168 10*3/uL (ref 150–400)
RBC: 3.3 MIL/uL — ABNORMAL LOW (ref 3.87–5.11)
RDW: 15.8 % — ABNORMAL HIGH (ref 11.5–15.5)
WBC Count: 10 10*3/uL (ref 4.0–10.5)
nRBC: 0 % (ref 0.0–0.2)

## 2020-10-27 MED ORDER — DARBEPOETIN ALFA 300 MCG/0.6ML IJ SOSY
PREFILLED_SYRINGE | INTRAMUSCULAR | Status: AC
  Filled 2020-10-27: qty 0.6

## 2020-10-27 MED ORDER — DARBEPOETIN ALFA 300 MCG/0.6ML IJ SOSY
300.0000 ug | PREFILLED_SYRINGE | Freq: Once | INTRAMUSCULAR | Status: AC
  Administered 2020-10-27: 300 ug via SUBCUTANEOUS

## 2020-10-27 NOTE — Progress Notes (Signed)
Received call from [facility] LPN, patient needs renewed aranesp orders.  Her hemoglobin today is 9.1.  Orders placed.  Lillard Anes, NP

## 2020-10-27 NOTE — Patient Instructions (Signed)
Darbepoetin Alfa injection What is this medicine? DARBEPOETIN ALFA (dar be POE e tin AL fa) helps your body make more red blood cells. It is used to treat anemia caused by chronic kidney failure and chemotherapy. This medicine may be used for other purposes; ask your health care provider or pharmacist if you have questions. COMMON BRAND NAME(S): Aranesp What should I tell my health care provider before I take this medicine? They need to know if you have any of these conditions:  blood clotting disorders or history of blood clots  cancer patient not on chemotherapy  cystic fibrosis  heart disease, such as angina, heart failure, or a history of a heart attack  hemoglobin level of 12 g/dL or greater  high blood pressure  low levels of folate, iron, or vitamin B12  seizures  an unusual or allergic reaction to darbepoetin, erythropoietin, albumin, hamster proteins, latex, other medicines, foods, dyes, or preservatives  pregnant or trying to get pregnant  breast-feeding How should I use this medicine? This medicine is for injection into a vein or under the skin. It is usually given by a health care professional in a hospital or clinic setting. If you get this medicine at home, you will be taught how to prepare and give this medicine. Use exactly as directed. Take your medicine at regular intervals. Do not take your medicine more often than directed. It is important that you put your used needles and syringes in a special sharps container. Do not put them in a trash can. If you do not have a sharps container, call your pharmacist or healthcare provider to get one. A special MedGuide will be given to you by the pharmacist with each prescription and refill. Be sure to read this information carefully each time. Talk to your pediatrician regarding the use of this medicine in children. While this medicine may be used in children as young as 1 month of age for selected conditions, precautions do  apply. Overdosage: If you think you have taken too much of this medicine contact a poison control center or emergency room at once. NOTE: This medicine is only for you. Do not share this medicine with others. What if I miss a dose? If you miss a dose, take it as soon as you can. If it is almost time for your next dose, take only that dose. Do not take double or extra doses. What may interact with this medicine? Do not take this medicine with any of the following medications:  epoetin alfa This list may not describe all possible interactions. Give your health care provider a list of all the medicines, herbs, non-prescription drugs, or dietary supplements you use. Also tell them if you smoke, drink alcohol, or use illegal drugs. Some items may interact with your medicine. What should I watch for while using this medicine? Your condition will be monitored carefully while you are receiving this medicine. You may need blood work done while you are taking this medicine. This medicine may cause a decrease in vitamin B6. You should make sure that you get enough vitamin B6 while you are taking this medicine. Discuss the foods you eat and the vitamins you take with your health care professional. What side effects may I notice from receiving this medicine? Side effects that you should report to your doctor or health care professional as soon as possible:  allergic reactions like skin rash, itching or hives, swelling of the face, lips, or tongue  breathing problems  changes in   vision  chest pain  confusion, trouble speaking or understanding  feeling faint or lightheaded, falls  high blood pressure  muscle aches or pains  pain, swelling, warmth in the leg  rapid weight gain  severe headaches  sudden numbness or weakness of the face, arm or leg  trouble walking, dizziness, loss of balance or coordination  seizures (convulsions)  swelling of the ankles, feet, hands  unusually weak or  tired Side effects that usually do not require medical attention (report to your doctor or health care professional if they continue or are bothersome):  diarrhea  fever, chills (flu-like symptoms)  headaches  nausea, vomiting  redness, stinging, or swelling at site where injected This list may not describe all possible side effects. Call your doctor for medical advice about side effects. You may report side effects to FDA at 1-800-FDA-1088. Where should I keep my medicine? Keep out of the reach of children. Store in a refrigerator between 2 and 8 degrees C (36 and 46 degrees F). Do not freeze. Do not shake. Throw away any unused portion if using a single-dose vial. Throw away any unused medicine after the expiration date. NOTE: This sheet is a summary. It may not cover all possible information. If you have questions about this medicine, talk to your doctor, pharmacist, or health care provider.  2020 [employer] Standard (2017-10-30 16:44:20)  

## 2020-11-07 ENCOUNTER — Other Ambulatory Visit: Payer: Self-pay

## 2020-11-07 DIAGNOSIS — Z20822 Contact with and (suspected) exposure to covid-19: Secondary | ICD-10-CM

## 2020-11-09 LAB — NOVEL CORONAVIRUS, NAA: SARS-CoV-2, NAA: NOT DETECTED

## 2020-11-09 LAB — SARS-COV-2, NAA 2 DAY TAT

## 2020-11-10 ENCOUNTER — Ambulatory Visit (INDEPENDENT_AMBULATORY_CARE_PROVIDER_SITE_OTHER): Payer: BC Managed Care – PPO | Admitting: *Deleted

## 2020-11-10 DIAGNOSIS — J309 Allergic rhinitis, unspecified: Secondary | ICD-10-CM | POA: Diagnosis not present

## 2020-11-11 NOTE — Telephone Encounter (Signed)
No entry 

## 2020-12-05 ENCOUNTER — Other Ambulatory Visit: Payer: Self-pay | Admitting: Hematology and Oncology

## 2020-12-06 ENCOUNTER — Other Ambulatory Visit: Payer: Self-pay | Admitting: Hematology and Oncology

## 2020-12-07 NOTE — Progress Notes (Signed)
  HEMATOLOGY-ONCOLOGY PROGRESS NOTE  CHIEF COMPLIANT: Follow-up of anemia of chronic kidney disease on Aranesp injections  INTERVAL HISTORY: Jocelyn Kaufman is a 65 y.o. female with above-mentioned history of anemia of chronic kidney disease who is currently receiving Aranesp injections (last 10/28/20). She presents over MyChart today for follow-up.  C/O worsening fatgue especially since we reduced the frequency of Aranesp injections. Her Hb has come down to low 9s and she feels very tired as a result of that.  Observations/Objective:  Today's Vitals   12/08/20 0910  BP: (!) 130/54  Pulse: 89  Resp: 18  Temp: (!) 97.5 F (36.4 C)  SpO2: 100%  Weight: 248 lb 4.8 oz (112.6 kg)  Height: 5' 4.5" (1.638 m)   Body mass index is 41.96 kg/m.  I have reviewed the data as listed CMP Latest Ref Rng & Units 10/07/2018 07/12/2018 07/11/2018  Glucose 65 - 99 mg/dL 123(H) 128(H) 143(H)  BUN 8 - 27 mg/dL 26 47(H) 43(H)  Creatinine 0.57 - 1.00 mg/dL 1.13(H) 1.35(H) 1.60(H)  Sodium 134 - 144 mmol/L 140 136 136  Potassium 3.5 - 5.2 mmol/L 5.1 4.6 4.3  Chloride 96 - 106 mmol/L 105 103 103  CO2 20 - 29 mmol/L 21 23 22   Calcium 8.7 - 10.3 mg/dL 9.4 8.7(L) 8.8(L)  Total Protein 6.5 - 8.1 g/dL - - -  Total Bilirubin 0.3 - 1.2 mg/dL - - -  Alkaline Phos 38 - 126 U/L - - -  AST 15 - 41 U/L - - -  ALT 0 - 44 U/L - - -    Lab Results  Component Value Date   WBC 7.6 12/08/2020   HGB 9.2 (L) 12/08/2020   HCT 30.5 (L) 12/08/2020   MCV 89.7 12/08/2020   PLT 203 12/08/2020   NEUTROABS 5.4 12/08/2020      Assessment Plan:  Anemia in chronic kidney disease Patient has history of hypothyroidism as well as possible adrenal dysfunction issues for which [facility] goes to [facility]  Previous blood work review: 1.Erythropoietin: 24.2 2. SPEP:No M protein 3.Haptoglobin: 146, LDH 281, absolute reticulocyte count 96.4, direct Coombs test negative 4.B12 750  5.Folic acid 56.3 6.Ferritin 88, iron saturation  25 7.Hemoglobin 9.6 with MCV 89 ----------------------------------------------------------------------------------------------------------------------------------------------- Etiology: Anemia of chronic disease secondary tochronic kidney disease,hypothyroidism and adrenal issues   Current treatment:Aranesp300 mcg started7/06/2020. Lab review 03/23/2020: Erythropoietin 27.9, hemoglobin 9.6 May 27, 2020:Hemoglobin 10.6 06/17/2020:Hemoglobin 10, MCV 89.6 07/15/2020: Hemoglobin 10.3 08/12/2020: Hb 10.6 10/27/20: Hb 9.1 12/09/19: Hb 9.2  Change Aranesp to q 4 weeks since shes feeling extremely exhausted.  I will see her in 2 months    I discussed the assessment and treatment plan with the patient. The patient was provided an opportunity to ask questions and all were answered. The patient agreed with the plan and demonstrated an understanding of the instructions. The patient was advised to call back or seek an in-person evaluation if the symptoms worsen or if the condition fails to improve as anticipated.   Total time spent: 20 minutes including face-to-face MyChart video visit time and time spent for planning, charting and coordination of care  Rulon Eisenmenger, MD 12/08/2020   I, Cloyde Reams Dorshimer, am acting as scribe for Nicholas Lose, MD.  I have reviewed the above documentation for accuracy and completeness, and I agree with the above.

## 2020-12-08 ENCOUNTER — Inpatient Hospital Stay: Payer: Medicare PPO

## 2020-12-08 ENCOUNTER — Inpatient Hospital Stay: Payer: Medicare PPO | Attending: Hematology and Oncology | Admitting: Hematology and Oncology

## 2020-12-08 ENCOUNTER — Other Ambulatory Visit: Payer: Self-pay

## 2020-12-08 ENCOUNTER — Telehealth: Payer: Self-pay | Admitting: Hematology and Oncology

## 2020-12-08 VITALS — BP 130/54 | HR 89 | Temp 97.5°F | Resp 18

## 2020-12-08 DIAGNOSIS — D649 Anemia, unspecified: Secondary | ICD-10-CM

## 2020-12-08 DIAGNOSIS — D638 Anemia in other chronic diseases classified elsewhere: Secondary | ICD-10-CM

## 2020-12-08 DIAGNOSIS — N183 Chronic kidney disease, stage 3 unspecified: Secondary | ICD-10-CM | POA: Diagnosis not present

## 2020-12-08 DIAGNOSIS — D631 Anemia in chronic kidney disease: Secondary | ICD-10-CM | POA: Insufficient documentation

## 2020-12-08 LAB — CBC WITH DIFFERENTIAL (CANCER CENTER ONLY)
Abs Immature Granulocytes: 0.03 10*3/uL (ref 0.00–0.07)
Basophils Absolute: 0 10*3/uL (ref 0.0–0.1)
Basophils Relative: 0 %
Eosinophils Absolute: 0.1 10*3/uL (ref 0.0–0.5)
Eosinophils Relative: 2 %
HCT: 30.5 % — ABNORMAL LOW (ref 36.0–46.0)
Hemoglobin: 9.2 g/dL — ABNORMAL LOW (ref 12.0–15.0)
Immature Granulocytes: 0 %
Lymphocytes Relative: 20 %
Lymphs Abs: 1.5 10*3/uL (ref 0.7–4.0)
MCH: 27.1 pg (ref 26.0–34.0)
MCHC: 30.2 g/dL (ref 30.0–36.0)
MCV: 89.7 fL (ref 80.0–100.0)
Monocytes Absolute: 0.4 10*3/uL (ref 0.1–1.0)
Monocytes Relative: 6 %
Neutro Abs: 5.4 10*3/uL (ref 1.7–7.7)
Neutrophils Relative %: 72 %
Platelet Count: 203 10*3/uL (ref 150–400)
RBC: 3.4 MIL/uL — ABNORMAL LOW (ref 3.87–5.11)
RDW: 16.8 % — ABNORMAL HIGH (ref 11.5–15.5)
WBC Count: 7.6 10*3/uL (ref 4.0–10.5)
nRBC: 0 % (ref 0.0–0.2)

## 2020-12-08 MED ORDER — DARBEPOETIN ALFA 300 MCG/0.6ML IJ SOSY
PREFILLED_SYRINGE | INTRAMUSCULAR | Status: AC
  Filled 2020-12-08: qty 0.6

## 2020-12-08 MED ORDER — METHOTREXATE SODIUM CHEMO INJECTION (PF) 50 MG/2ML: INTRAMUSCULAR | Status: DC

## 2020-12-08 MED ORDER — METFORMIN HCL ER 500 MG PO TB24: 1000.0000 mg | ORAL_TABLET | Freq: Every day | ORAL | Status: DC

## 2020-12-08 MED ORDER — DARBEPOETIN ALFA 300 MCG/0.6ML IJ SOSY
300.0000 ug | PREFILLED_SYRINGE | Freq: Once | INTRAMUSCULAR | Status: AC
  Administered 2020-12-08: 300 ug via SUBCUTANEOUS

## 2020-12-08 NOTE — Assessment & Plan Note (Signed)
Patient has history of hypothyroidism as well as possible adrenal dysfunction issues for which she goes to [facility]  Previous blood work review: 1.Erythropoietin: 24.2 2. SPEP:No M protein 3.Haptoglobin: 146, LDH 281, absolute reticulocyte count 96.4, direct Coombs test negative 4.B12 750  5.Folic acid 57.5 6.Ferritin 88, iron saturation 25 7.Hemoglobin 9.6 with MCV 89 ----------------------------------------------------------------------------------------------------------------------------------------------- Etiology: Anemia of chronic disease secondary tochronic kidney disease,hypothyroidism and adrenal issues   Current treatment:Aranesp300 mcg started7/06/2020. Lab review 03/23/2020: Erythropoietin 27.9, hemoglobin 9.6 May 27, 2020:Hemoglobin 10.6 06/17/2020:Hemoglobin 10, MCV 89.6 07/15/2020: Hemoglobin 10.3 08/12/2020: Hb 10.6 10/27/20: Hb 9.1  Patient will return  q 6 weeks for Aranesp injections with labs. We will plan to give her Aranesp as long as it is less than 10.4 hemoglobin. I will see her in 3 months

## 2020-12-08 NOTE — Telephone Encounter (Signed)
Scheduled appts per 2/10 los. Gave pt a print out of AVS.

## 2020-12-12 ENCOUNTER — Ambulatory Visit (INDEPENDENT_AMBULATORY_CARE_PROVIDER_SITE_OTHER): Payer: Medicare PPO | Admitting: *Deleted

## 2020-12-12 DIAGNOSIS — J309 Allergic rhinitis, unspecified: Secondary | ICD-10-CM

## 2021-01-05 ENCOUNTER — Inpatient Hospital Stay: Payer: Medicare PPO | Attending: Hematology and Oncology

## 2021-01-05 ENCOUNTER — Other Ambulatory Visit: Payer: Self-pay

## 2021-01-05 ENCOUNTER — Inpatient Hospital Stay: Payer: Medicare PPO

## 2021-01-05 VITALS — BP 143/65 | HR 80 | Temp 98.2°F | Resp 20

## 2021-01-05 DIAGNOSIS — D638 Anemia in other chronic diseases classified elsewhere: Secondary | ICD-10-CM

## 2021-01-05 DIAGNOSIS — D631 Anemia in chronic kidney disease: Secondary | ICD-10-CM | POA: Insufficient documentation

## 2021-01-05 DIAGNOSIS — N1831 Chronic kidney disease, stage 3a: Secondary | ICD-10-CM | POA: Diagnosis not present

## 2021-01-05 DIAGNOSIS — D649 Anemia, unspecified: Secondary | ICD-10-CM

## 2021-01-05 LAB — CBC WITH DIFFERENTIAL (CANCER CENTER ONLY)
Abs Immature Granulocytes: 0.03 10*3/uL (ref 0.00–0.07)
Basophils Absolute: 0 10*3/uL (ref 0.0–0.1)
Basophils Relative: 1 %
Eosinophils Absolute: 0.2 10*3/uL (ref 0.0–0.5)
Eosinophils Relative: 3 %
HCT: 33.6 % — ABNORMAL LOW (ref 36.0–46.0)
Hemoglobin: 9.9 g/dL — ABNORMAL LOW (ref 12.0–15.0)
Immature Granulocytes: 1 %
Lymphocytes Relative: 19 %
Lymphs Abs: 1.2 10*3/uL (ref 0.7–4.0)
MCH: 26.3 pg (ref 26.0–34.0)
MCHC: 29.5 g/dL — ABNORMAL LOW (ref 30.0–36.0)
MCV: 89.4 fL (ref 80.0–100.0)
Monocytes Absolute: 0.2 10*3/uL (ref 0.1–1.0)
Monocytes Relative: 4 %
Neutro Abs: 4.8 10*3/uL (ref 1.7–7.7)
Neutrophils Relative %: 72 %
Platelet Count: 228 10*3/uL (ref 150–400)
RBC: 3.76 MIL/uL — ABNORMAL LOW (ref 3.87–5.11)
RDW: 16.5 % — ABNORMAL HIGH (ref 11.5–15.5)
WBC Count: 6.5 10*3/uL (ref 4.0–10.5)
nRBC: 0 % (ref 0.0–0.2)

## 2021-01-05 MED ORDER — DARBEPOETIN ALFA 300 MCG/0.6ML IJ SOSY
300.0000 ug | PREFILLED_SYRINGE | Freq: Once | INTRAMUSCULAR | Status: AC
  Administered 2021-01-05: 300 ug via SUBCUTANEOUS

## 2021-01-05 MED ORDER — DARBEPOETIN ALFA 300 MCG/0.6ML IJ SOSY
PREFILLED_SYRINGE | INTRAMUSCULAR | Status: AC
  Filled 2021-01-05: qty 0.6

## 2021-01-05 NOTE — Patient Instructions (Signed)
Darbepoetin Alfa injection What is this medicine? DARBEPOETIN ALFA (dar be POE e tin AL fa) helps your body make more red blood cells. It is used to treat anemia caused by chronic kidney failure and chemotherapy. This medicine may be used for other purposes; ask your health care provider or pharmacist if you have questions. COMMON BRAND NAME(S): Aranesp What should I tell my health care provider before I take this medicine? They need to know if you have any of these conditions:  blood clotting disorders or history of blood clots  cancer patient not on chemotherapy  cystic fibrosis  heart disease, such as angina, heart failure, or a history of a heart attack  hemoglobin level of 12 g/dL or greater  high blood pressure  low levels of folate, iron, or vitamin B12  seizures  an unusual or allergic reaction to darbepoetin, erythropoietin, albumin, hamster proteins, latex, other medicines, foods, dyes, or preservatives  pregnant or trying to get pregnant  breast-feeding How should I use this medicine? This medicine is for injection into a vein or under the skin. It is usually given by a health care professional in a hospital or clinic setting. If you get this medicine at home, you will be taught how to prepare and give this medicine. Use exactly as directed. Take your medicine at regular intervals. Do not take your medicine more often than directed. It is important that you put your used needles and syringes in a special sharps container. Do not put them in a trash can. If you do not have a sharps container, call your pharmacist or healthcare provider to get one. A special MedGuide will be given to you by the pharmacist with each prescription and refill. Be sure to read this information carefully each time. Talk to your pediatrician regarding the use of this medicine in children. While this medicine may be used in children as young as 1 month of age for selected conditions, precautions do  apply. Overdosage: If you think you have taken too much of this medicine contact a poison control center or emergency room at once. NOTE: This medicine is only for you. Do not share this medicine with others. What if I miss a dose? If you miss a dose, take it as soon as you can. If it is almost time for your next dose, take only that dose. Do not take double or extra doses. What may interact with this medicine? Do not take this medicine with any of the following medications:  epoetin alfa This list may not describe all possible interactions. Give your health care provider a list of all the medicines, herbs, non-prescription drugs, or dietary supplements you use. Also tell them if you smoke, drink alcohol, or use illegal drugs. Some items may interact with your medicine. What should I watch for while using this medicine? Your condition will be monitored carefully while you are receiving this medicine. You may need blood work done while you are taking this medicine. This medicine may cause a decrease in vitamin B6. You should make sure that you get enough vitamin B6 while you are taking this medicine. Discuss the foods you eat and the vitamins you take with your health care professional. What side effects may I notice from receiving this medicine? Side effects that you should report to your doctor or health care professional as soon as possible:  allergic reactions like skin rash, itching or hives, swelling of the face, lips, or tongue  breathing problems  changes in   vision  chest pain  confusion, trouble speaking or understanding  feeling faint or lightheaded, falls  high blood pressure  muscle aches or pains  pain, swelling, warmth in the leg  rapid weight gain  severe headaches  sudden numbness or weakness of the face, arm or leg  trouble walking, dizziness, loss of balance or coordination  seizures (convulsions)  swelling of the ankles, feet, hands  unusually weak or  tired Side effects that usually do not require medical attention (report to your doctor or health care professional if they continue or are bothersome):  diarrhea  fever, chills (flu-like symptoms)  headaches  nausea, vomiting  redness, stinging, or swelling at site where injected This list may not describe all possible side effects. Call your doctor for medical advice about side effects. You may report side effects to FDA at 1-800-FDA-1088. Where should I keep my medicine? Keep out of the reach of children. Store in a refrigerator between 2 and 8 degrees C (36 and 46 degrees F). Do not freeze. Do not shake. Throw away any unused portion if using a single-dose vial. Throw away any unused medicine after the expiration date. NOTE: This sheet is a summary. It may not cover all possible information. If you have questions about this medicine, talk to your doctor, pharmacist, or health care provider.  2021 Elsevier/Gold Standard (2017-10-30 16:44:20)  

## 2021-01-10 ENCOUNTER — Ambulatory Visit (INDEPENDENT_AMBULATORY_CARE_PROVIDER_SITE_OTHER): Payer: Medicare PPO | Admitting: *Deleted

## 2021-01-10 DIAGNOSIS — J309 Allergic rhinitis, unspecified: Secondary | ICD-10-CM | POA: Diagnosis not present

## 2021-01-18 DIAGNOSIS — J301 Allergic rhinitis due to pollen: Secondary | ICD-10-CM | POA: Diagnosis not present

## 2021-01-18 NOTE — Progress Notes (Signed)
Vials exp 01-18-22

## 2021-01-19 DIAGNOSIS — J3089 Other allergic rhinitis: Secondary | ICD-10-CM | POA: Diagnosis not present

## 2021-02-01 NOTE — Assessment & Plan Note (Signed)
Patient has history of hypothyroidism as well as possible adrenal dysfunction issues for which she goes to [facility]  Previous blood work review: 1.Erythropoietin: 24.2 2. SPEP:No M protein 3.Haptoglobin: 146, LDH 281, absolute reticulocyte count 96.4, direct Coombs test negative 4.B12 750  5.Folic acid 73.5 6.Ferritin 88, iron saturation 25 7.Hemoglobin 9.6 with MCV 89 ----------------------------------------------------------------------------------------------------------------------------------------------- Etiology: Anemia of chronic disease secondary tochronic kidney disease,hypothyroidism and adrenal issues   Current treatment:Aranesp300 mcg started7/06/2020. Lab review 03/23/2020: Erythropoietin 27.9, hemoglobin 9.6 May 27, 2020:Hemoglobin 10.6 06/17/2020:Hemoglobin 10, MCV 89.6 07/15/2020: Hemoglobin 10.3 08/12/2020:Hb 10.6 10/27/20: Hb 9.1 12/09/19: Hb 9.2 01/05/21: Hb 9.9  Continue Aranesp to q 4 weeks since shes feeling extremely exhausted.  I will see her in 2 months

## 2021-02-01 NOTE — Progress Notes (Signed)
Patient Care Team: Deland Pretty, MD as PCP - General (Internal Medicine) Debara Pickett Nadean Corwin, MD as PCP - Cardiology (Cardiology)  DIAGNOSIS:    ICD-10-CM   1. Anemia in stage 3 chronic kidney disease, unspecified whether stage 3a or 3b CKD (HCC)  N18.30    D63.1     CHIEF COMPLIANT: Follow-up of anemia of chronic kidney disease on Aranesp injections  INTERVAL HISTORY: Jocelyn Kaufman is a 65 y.o. with above-mentioned history of anemia of chronic kidney disease who is currently receiving Aranesp injections (last 01/05/21). She presents to the clinic today for follow-up.   Since we changed her frequency of Aranesp injections she is feeling a lot better.  She has more energy to do activities.  However she still feels tired when she exerts herself.  ALLERGIES:  is allergic to jardiance [empagliflozin], latex, invokana [canagliflozin], percocet [oxycodone-acetaminophen], prilosec [omeprazole], rogaine [minoxidil], and victoza [liraglutide].  MEDICATIONS:  Current Outpatient Medications  Medication Sig Dispense Refill  . Aflibercept (EYLEA) 2 MG/0.05ML SOLN 2 mg by Intravitreal route every 3 (three) months.    Marland Kitchen albuterol (VENTOLIN HFA) 108 (90 Base) MCG/ACT inhaler Inhale two puffs every four to six hours as needed for cough or wheeze. 18 g 1  . amLODipine (NORVASC) 5 MG tablet Take 2.5 mg by mouth daily.     Marland Kitchen aspirin EC 81 MG tablet Take 1 tablet (81 mg total) by mouth daily. Swallow whole. 30 tablet 11  . atorvastatin (LIPITOR) 40 MG tablet Take 1 tablet (40 mg total) by mouth daily at 6 PM. 30 tablet 0  . B-D ULTRAFINE III SHORT PEN 31G X 8 MM MISC SMARTSIG:1 Each SUB-Q Daily    . beclomethasone (QVAR) 40 MCG/ACT inhaler Inhale 2 puffs into the lungs 2 (two) times daily. (Patient taking differently: Inhale 2 puffs into the lungs 2 (two) times daily as needed.) 10.6 g 5  . EPINEPHrine (EPIPEN 2-PAK) 0.3 mg/0.3 mL IJ SOAJ injection Use as directed for life-threatening allergic reaction. 2 each  1  . escitalopram (LEXAPRO) 20 MG tablet Take 20 mg by mouth daily.    Marland Kitchen ezetimibe (ZETIA) 10 MG tablet Take 1 tablet (10 mg total) by mouth daily.    . ferrous sulfate 325 (65 FE) MG tablet Take 365 mg by mouth daily.     . folic acid (FOLVITE) 1 MG tablet Take 1 mg by mouth 2 (two) times daily.     . hydrALAZINE (APRESOLINE) 25 MG tablet Take 25 mg by mouth 3 (three) times daily.     . insulin aspart protamine - aspart (NOVOLOG 70/30 FLEXPEN RELION) (70-30) 100 UNIT/ML FlexPen Inject 0.15-0.2 mLs (15-20 Units total) into the skin in the morning, at noon, and at bedtime. 15 mL 11  . Insulin Glargine (BASAGLAR KWIKPEN) 100 UNIT/ML Inject 45 Units into the skin daily.    . irbesartan (AVAPRO) 150 MG tablet Take 1 tablet (150 mg total) by mouth daily.    Marland Kitchen latanoprost (XALATAN) 0.005 % ophthalmic solution 1 drop at bedtime.    Marland Kitchen levocetirizine (XYZAL) 5 MG tablet Take 1 tablet (5 mg total) by mouth every evening.    Marland Kitchen levothyroxine (SYNTHROID) 125 MCG tablet Take 125 mcg by mouth daily before breakfast. BRAND ONLY    . LORazepam (ATIVAN) 0.5 MG tablet Take 0.5 mg by mouth as needed for anxiety.    . metFORMIN (GLUCOPHAGE-XR) 500 MG 24 hr tablet Take 2 tablets (1,000 mg total) by mouth daily. Take 1 or 2 tablets  based on sugars    . Methotrexate Sodium (METHOTREXATE, PF,) 50 MG/2ML injection 0.9 ml once a week 2 mL   . metoprolol succinate (TOPROL-XL) 25 MG 24 hr tablet Take 1 tablet (25 mg total) by mouth daily. 90 tablet 3  . Multiple Vitamins-Minerals (CENTRUM SILVER PO) Take 1 tablet by mouth daily.    . TRULICITY 65 YO/3.7CH SOPN Inject 0.75 mLs (4.5 mg total) into the skin once a week.    . vitamin B-12 (CYANOCOBALAMIN) 100 MCG tablet Take 100 mcg by mouth daily.     Marland Kitchen VITAMIN D, ERGOCALCIFEROL, PO Take 2,000 mg by mouth 2 (two) times daily.     No current facility-administered medications for this visit.    PHYSICAL EXAMINATION: ECOG PERFORMANCE STATUS: 1 - Symptomatic but completely  ambulatory  Vitals:   02/02/21 0903  BP: (!) 145/50  Pulse: 76  Resp: 20  Temp: 97.7 F (36.5 C)  SpO2: 98%   Filed Weights   02/02/21 0903  Weight: 244 lb 14.4 oz (111.1 kg)     LABORATORY DATA:  I have reviewed the data as listed CMP Latest Ref Rng & Units 10/07/2018 07/12/2018 07/11/2018  Glucose 65 - 99 mg/dL 123(H) 128(H) 143(H)  BUN 8 - 27 mg/dL 26 47(H) 43(H)  Creatinine 0.57 - 1.00 mg/dL 1.13(H) 1.35(H) 1.60(H)  Sodium 134 - 144 mmol/L 140 136 136  Potassium 3.5 - 5.2 mmol/L 5.1 4.6 4.3  Chloride 96 - 106 mmol/L 105 103 103  CO2 20 - 29 mmol/L 21 23 22   Calcium 8.7 - 10.3 mg/dL 9.4 8.7(L) 8.8(L)  Total Protein 6.5 - 8.1 g/dL - - -  Total Bilirubin 0.3 - 1.2 mg/dL - - -  Alkaline Phos 38 - 126 U/L - - -  AST 15 - 41 U/L - - -  ALT 0 - 44 U/L - - -    Lab Results  Component Value Date   WBC 6.3 02/02/2021   HGB 10.6 (L) 02/02/2021   HCT 35.4 (L) 02/02/2021   MCV 89.2 02/02/2021   PLT 194 02/02/2021   NEUTROABS 4.4 02/02/2021    ASSESSMENT & PLAN:  Anemia in chronic kidney disease Patient has history of hypothyroidism as well as possible adrenal dysfunction issues for which [facility] goes to [facility]  Previous blood work review: 1.Erythropoietin: 24.2 2. SPEP:No M protein 3.Haptoglobin: 146, LDH 281, absolute reticulocyte count 96.4, direct Coombs test negative 4.B12 750  5.Folic acid 88.5 6.Ferritin 88, iron saturation 25 7.Hemoglobin 9.6 with MCV 89 ----------------------------------------------------------------------------------------------------------------------------------------------- Etiology: Anemia of chronic disease secondary tochronic kidney disease,hypothyroidism and adrenal issues   Current treatment:Aranesp300 mcg started7/06/2020. Lab review 03/23/2020: Erythropoietin 27.9, hemoglobin 9.6 May 27, 2020:Hemoglobin 10.6 06/17/2020:Hemoglobin 10, MCV 89.6 07/15/2020: Hemoglobin 10.3 08/12/2020:Hb 10.6 10/27/20: Hb  9.1 12/09/19: Hb 9.2 01/05/21: Hb 9.9 02/02/2021: Hemoglobin 10.6 (given her severe symptoms I recommended to proceed with her Aranesp injection today)  Change Aranesp to q 5 weeks   We will set up Aranesp injection appointments every 5 weeks and I will see her back in 10 weeks.   No orders of the defined types were placed in this encounter.  The patient has a good understanding of the overall plan. she agrees with it. she will call with any problems that may develop before the next visit here.  Total time spent: 30 mins including face to face time and time spent for planning, charting and coordination of care  Rulon Eisenmenger, MD, MPH 02/02/2021  I, Molly Dorshimer, am acting  as scribe for Dr. Nicholas Lose.  I have reviewed the above documentation for accuracy and completeness, and I agree with the above.

## 2021-02-02 ENCOUNTER — Other Ambulatory Visit: Payer: Self-pay | Admitting: Hematology and Oncology

## 2021-02-02 ENCOUNTER — Inpatient Hospital Stay: Payer: Medicare PPO | Attending: Hematology and Oncology

## 2021-02-02 ENCOUNTER — Other Ambulatory Visit: Payer: Self-pay

## 2021-02-02 ENCOUNTER — Inpatient Hospital Stay: Payer: Medicare PPO | Admitting: Hematology and Oncology

## 2021-02-02 ENCOUNTER — Telehealth: Payer: Self-pay | Admitting: Hematology and Oncology

## 2021-02-02 ENCOUNTER — Inpatient Hospital Stay: Payer: Medicare PPO

## 2021-02-02 DIAGNOSIS — D631 Anemia in chronic kidney disease: Secondary | ICD-10-CM | POA: Insufficient documentation

## 2021-02-02 DIAGNOSIS — N183 Chronic kidney disease, stage 3 unspecified: Secondary | ICD-10-CM

## 2021-02-02 DIAGNOSIS — N1831 Chronic kidney disease, stage 3a: Secondary | ICD-10-CM | POA: Diagnosis not present

## 2021-02-02 DIAGNOSIS — D649 Anemia, unspecified: Secondary | ICD-10-CM

## 2021-02-02 DIAGNOSIS — D638 Anemia in other chronic diseases classified elsewhere: Secondary | ICD-10-CM

## 2021-02-02 LAB — CBC WITH DIFFERENTIAL (CANCER CENTER ONLY)
Abs Immature Granulocytes: 0.01 10*3/uL (ref 0.00–0.07)
Basophils Absolute: 0 10*3/uL (ref 0.0–0.1)
Basophils Relative: 1 %
Eosinophils Absolute: 0.2 10*3/uL (ref 0.0–0.5)
Eosinophils Relative: 3 %
HCT: 35.4 % — ABNORMAL LOW (ref 36.0–46.0)
Hemoglobin: 10.6 g/dL — ABNORMAL LOW (ref 12.0–15.0)
Immature Granulocytes: 0 %
Lymphocytes Relative: 23 %
Lymphs Abs: 1.5 10*3/uL (ref 0.7–4.0)
MCH: 26.7 pg (ref 26.0–34.0)
MCHC: 29.9 g/dL — ABNORMAL LOW (ref 30.0–36.0)
MCV: 89.2 fL (ref 80.0–100.0)
Monocytes Absolute: 0.3 10*3/uL (ref 0.1–1.0)
Monocytes Relative: 4 %
Neutro Abs: 4.4 10*3/uL (ref 1.7–7.7)
Neutrophils Relative %: 69 %
Platelet Count: 194 10*3/uL (ref 150–400)
RBC: 3.97 MIL/uL (ref 3.87–5.11)
RDW: 16.6 % — ABNORMAL HIGH (ref 11.5–15.5)
WBC Count: 6.3 10*3/uL (ref 4.0–10.5)
nRBC: 0 % (ref 0.0–0.2)

## 2021-02-02 MED ORDER — DARBEPOETIN ALFA 300 MCG/0.6ML IJ SOSY
PREFILLED_SYRINGE | INTRAMUSCULAR | Status: AC
  Filled 2021-02-02: qty 0.6

## 2021-02-02 MED ORDER — DARBEPOETIN ALFA 300 MCG/0.6ML IJ SOSY
300.0000 ug | PREFILLED_SYRINGE | Freq: Once | INTRAMUSCULAR | Status: AC
  Administered 2021-02-02: 300 ug via SUBCUTANEOUS

## 2021-02-02 MED ORDER — TOUJEO MAX SOLOSTAR 300 UNIT/ML ~~LOC~~ SOPN: 45.0000 [IU] | PEN_INJECTOR | Freq: Every day | SUBCUTANEOUS | Status: DC

## 2021-02-02 MED ORDER — METHOTREXATE SODIUM CHEMO INJECTION (PF) 50 MG/2ML: 25.0000 mg | INTRAMUSCULAR | Status: DC

## 2021-02-02 NOTE — Patient Instructions (Signed)
Darbepoetin Alfa injection What is this medicine? DARBEPOETIN ALFA (dar be POE e tin AL fa) helps your body make more red blood cells. It is used to treat anemia caused by chronic kidney failure and chemotherapy. This medicine may be used for other purposes; ask your health care provider or pharmacist if you have questions. COMMON BRAND NAME(S): Aranesp What should I tell my health care provider before I take this medicine? They need to know if you have any of these conditions:  blood clotting disorders or history of blood clots  cancer patient not on chemotherapy  cystic fibrosis  heart disease, such as angina, heart failure, or a history of a heart attack  hemoglobin level of 12 g/dL or greater  high blood pressure  low levels of folate, iron, or vitamin B12  seizures  an unusual or allergic reaction to darbepoetin, erythropoietin, albumin, hamster proteins, latex, other medicines, foods, dyes, or preservatives  pregnant or trying to get pregnant  breast-feeding How should I use this medicine? This medicine is for injection into a vein or under the skin. It is usually given by a health care professional in a hospital or clinic setting. If you get this medicine at home, you will be taught how to prepare and give this medicine. Use exactly as directed. Take your medicine at regular intervals. Do not take your medicine more often than directed. It is important that you put your used needles and syringes in a special sharps container. Do not put them in a trash can. If you do not have a sharps container, call your pharmacist or healthcare provider to get one. A special MedGuide will be given to you by the pharmacist with each prescription and refill. Be sure to read this information carefully each time. Talk to your pediatrician regarding the use of this medicine in children. While this medicine may be used in children as young as 1 month of age for selected conditions, precautions do  apply. Overdosage: If you think you have taken too much of this medicine contact a poison control center or emergency room at once. NOTE: This medicine is only for you. Do not share this medicine with others. What if I miss a dose? If you miss a dose, take it as soon as you can. If it is almost time for your next dose, take only that dose. Do not take double or extra doses. What may interact with this medicine? Do not take this medicine with any of the following medications:  epoetin alfa This list may not describe all possible interactions. Give your health care provider a list of all the medicines, herbs, non-prescription drugs, or dietary supplements you use. Also tell them if you smoke, drink alcohol, or use illegal drugs. Some items may interact with your medicine. What should I watch for while using this medicine? Your condition will be monitored carefully while you are receiving this medicine. You may need blood work done while you are taking this medicine. This medicine may cause a decrease in vitamin B6. You should make sure that you get enough vitamin B6 while you are taking this medicine. Discuss the foods you eat and the vitamins you take with your health care professional. What side effects may I notice from receiving this medicine? Side effects that you should report to your doctor or health care professional as soon as possible:  allergic reactions like skin rash, itching or hives, swelling of the face, lips, or tongue  breathing problems  changes in   vision  chest pain  confusion, trouble speaking or understanding  feeling faint or lightheaded, falls  high blood pressure  muscle aches or pains  pain, swelling, warmth in the leg  rapid weight gain  severe headaches  sudden numbness or weakness of the face, arm or leg  trouble walking, dizziness, loss of balance or coordination  seizures (convulsions)  swelling of the ankles, feet, hands  unusually weak or  tired Side effects that usually do not require medical attention (report to your doctor or health care professional if they continue or are bothersome):  diarrhea  fever, chills (flu-like symptoms)  headaches  nausea, vomiting  redness, stinging, or swelling at site where injected This list may not describe all possible side effects. Call your doctor for medical advice about side effects. You may report side effects to FDA at 1-800-FDA-1088. Where should I keep my medicine? Keep out of the reach of children. Store in a refrigerator between 2 and 8 degrees C (36 and 46 degrees F). Do not freeze. Do not shake. Throw away any unused portion if using a single-dose vial. Throw away any unused medicine after the expiration date. NOTE: This sheet is a summary. It may not cover all possible information. If you have questions about this medicine, talk to your doctor, pharmacist, or health care provider.  2021 Elsevier/Gold Standard (2017-10-30 16:44:20)  

## 2021-02-02 NOTE — Telephone Encounter (Signed)
Scheduled appts per 4/7 los. Pt aware.

## 2021-02-09 ENCOUNTER — Ambulatory Visit (INDEPENDENT_AMBULATORY_CARE_PROVIDER_SITE_OTHER): Payer: Medicare PPO | Admitting: *Deleted

## 2021-02-09 DIAGNOSIS — J309 Allergic rhinitis, unspecified: Secondary | ICD-10-CM

## 2021-03-09 ENCOUNTER — Inpatient Hospital Stay: Payer: Medicare PPO | Attending: Hematology and Oncology

## 2021-03-09 ENCOUNTER — Other Ambulatory Visit: Payer: Self-pay

## 2021-03-09 ENCOUNTER — Inpatient Hospital Stay: Payer: Medicare PPO

## 2021-03-09 VITALS — BP 149/83 | HR 72 | Temp 98.2°F | Resp 18

## 2021-03-09 DIAGNOSIS — N183 Chronic kidney disease, stage 3 unspecified: Secondary | ICD-10-CM | POA: Insufficient documentation

## 2021-03-09 DIAGNOSIS — D631 Anemia in chronic kidney disease: Secondary | ICD-10-CM | POA: Diagnosis not present

## 2021-03-09 DIAGNOSIS — D649 Anemia, unspecified: Secondary | ICD-10-CM

## 2021-03-09 DIAGNOSIS — D638 Anemia in other chronic diseases classified elsewhere: Secondary | ICD-10-CM

## 2021-03-09 LAB — CBC WITH DIFFERENTIAL (CANCER CENTER ONLY)
Abs Immature Granulocytes: 0.02 10*3/uL (ref 0.00–0.07)
Basophils Absolute: 0 10*3/uL (ref 0.0–0.1)
Basophils Relative: 1 %
Eosinophils Absolute: 0.2 10*3/uL (ref 0.0–0.5)
Eosinophils Relative: 2 %
HCT: 32.5 % — ABNORMAL LOW (ref 36.0–46.0)
Hemoglobin: 9.7 g/dL — ABNORMAL LOW (ref 12.0–15.0)
Immature Granulocytes: 0 %
Lymphocytes Relative: 25 %
Lymphs Abs: 1.8 10*3/uL (ref 0.7–4.0)
MCH: 27.3 pg (ref 26.0–34.0)
MCHC: 29.8 g/dL — ABNORMAL LOW (ref 30.0–36.0)
MCV: 91.5 fL (ref 80.0–100.0)
Monocytes Absolute: 0.4 10*3/uL (ref 0.1–1.0)
Monocytes Relative: 5 %
Neutro Abs: 4.8 10*3/uL (ref 1.7–7.7)
Neutrophils Relative %: 67 %
Platelet Count: 212 10*3/uL (ref 150–400)
RBC: 3.55 MIL/uL — ABNORMAL LOW (ref 3.87–5.11)
RDW: 16.2 % — ABNORMAL HIGH (ref 11.5–15.5)
WBC Count: 7.2 10*3/uL (ref 4.0–10.5)
nRBC: 0 % (ref 0.0–0.2)

## 2021-03-09 MED ORDER — DARBEPOETIN ALFA 300 MCG/0.6ML IJ SOSY
300.0000 ug | PREFILLED_SYRINGE | Freq: Once | INTRAMUSCULAR | Status: AC
  Administered 2021-03-09: 300 ug via SUBCUTANEOUS

## 2021-03-09 MED ORDER — DARBEPOETIN ALFA 300 MCG/0.6ML IJ SOSY
PREFILLED_SYRINGE | INTRAMUSCULAR | Status: AC
  Filled 2021-03-09: qty 0.6

## 2021-03-09 NOTE — Patient Instructions (Signed)
Darbepoetin Alfa injection What is this medicine? DARBEPOETIN ALFA (dar be POE e tin AL fa) helps your body make more red blood cells. It is used to treat anemia caused by chronic kidney failure and chemotherapy. This medicine may be used for other purposes; ask your health care provider or pharmacist if you have questions. COMMON BRAND NAME(S): Aranesp What should I tell my health care provider before I take this medicine? They need to know if you have any of these conditions:  blood clotting disorders or history of blood clots  cancer patient not on chemotherapy  cystic fibrosis  heart disease, such as angina, heart failure, or a history of a heart attack  hemoglobin level of 12 g/dL or greater  high blood pressure  low levels of folate, iron, or vitamin B12  seizures  an unusual or allergic reaction to darbepoetin, erythropoietin, albumin, hamster proteins, latex, other medicines, foods, dyes, or preservatives  pregnant or trying to get pregnant  breast-feeding How should I use this medicine? This medicine is for injection into a vein or under the skin. It is usually given by a health care professional in a hospital or clinic setting. If you get this medicine at home, you will be taught how to prepare and give this medicine. Use exactly as directed. Take your medicine at regular intervals. Do not take your medicine more often than directed. It is important that you put your used needles and syringes in a special sharps container. Do not put them in a trash can. If you do not have a sharps container, call your pharmacist or healthcare provider to get one. A special MedGuide will be given to you by the pharmacist with each prescription and refill. Be sure to read this information carefully each time. Talk to your pediatrician regarding the use of this medicine in children. While this medicine may be used in children as young as 1 month of age for selected conditions, precautions do  apply. Overdosage: If you think you have taken too much of this medicine contact a poison control center or emergency room at once. NOTE: This medicine is only for you. Do not share this medicine with others. What if I miss a dose? If you miss a dose, take it as soon as you can. If it is almost time for your next dose, take only that dose. Do not take double or extra doses. What may interact with this medicine? Do not take this medicine with any of the following medications:  epoetin alfa This list may not describe all possible interactions. Give your health care provider a list of all the medicines, herbs, non-prescription drugs, or dietary supplements you use. Also tell them if you smoke, drink alcohol, or use illegal drugs. Some items may interact with your medicine. What should I watch for while using this medicine? Your condition will be monitored carefully while you are receiving this medicine. You may need blood work done while you are taking this medicine. This medicine may cause a decrease in vitamin B6. You should make sure that you get enough vitamin B6 while you are taking this medicine. Discuss the foods you eat and the vitamins you take with your health care professional. What side effects may I notice from receiving this medicine? Side effects that you should report to your doctor or health care professional as soon as possible:  allergic reactions like skin rash, itching or hives, swelling of the face, lips, or tongue  breathing problems  changes in   vision  chest pain  confusion, trouble speaking or understanding  feeling faint or lightheaded, falls  high blood pressure  muscle aches or pains  pain, swelling, warmth in the leg  rapid weight gain  severe headaches  sudden numbness or weakness of the face, arm or leg  trouble walking, dizziness, loss of balance or coordination  seizures (convulsions)  swelling of the ankles, feet, hands  unusually weak or  tired Side effects that usually do not require medical attention (report to your doctor or health care professional if they continue or are bothersome):  diarrhea  fever, chills (flu-like symptoms)  headaches  nausea, vomiting  redness, stinging, or swelling at site where injected This list may not describe all possible side effects. Call your doctor for medical advice about side effects. You may report side effects to FDA at 1-800-FDA-1088. Where should I keep my medicine? Keep out of the reach of children. Store in a refrigerator between 2 and 8 degrees C (36 and 46 degrees F). Do not freeze. Do not shake. Throw away any unused portion if using a single-dose vial. Throw away any unused medicine after the expiration date. NOTE: This sheet is a summary. It may not cover all possible information. If you have questions about this medicine, talk to your doctor, pharmacist, or health care provider.  2021 Elsevier/Gold Standard (2017-10-30 16:44:20)  

## 2021-03-31 ENCOUNTER — Ambulatory Visit (INDEPENDENT_AMBULATORY_CARE_PROVIDER_SITE_OTHER): Payer: Medicare PPO

## 2021-03-31 DIAGNOSIS — J309 Allergic rhinitis, unspecified: Secondary | ICD-10-CM

## 2021-04-04 ENCOUNTER — Other Ambulatory Visit: Payer: Self-pay | Admitting: Internal Medicine

## 2021-04-06 ENCOUNTER — Encounter: Payer: Self-pay | Admitting: Allergy

## 2021-04-06 ENCOUNTER — Other Ambulatory Visit: Payer: Self-pay

## 2021-04-06 ENCOUNTER — Ambulatory Visit (INDEPENDENT_AMBULATORY_CARE_PROVIDER_SITE_OTHER): Payer: Medicare PPO | Admitting: Allergy

## 2021-04-06 VITALS — BP 130/62 | HR 79 | Temp 97.6°F | Resp 14 | Ht 64.0 in | Wt 245.8 lb

## 2021-04-06 DIAGNOSIS — J4520 Mild intermittent asthma, uncomplicated: Secondary | ICD-10-CM | POA: Diagnosis not present

## 2021-04-06 DIAGNOSIS — Z91038 Other insect allergy status: Secondary | ICD-10-CM

## 2021-04-06 DIAGNOSIS — J309 Allergic rhinitis, unspecified: Secondary | ICD-10-CM | POA: Diagnosis not present

## 2021-04-06 DIAGNOSIS — J3089 Other allergic rhinitis: Secondary | ICD-10-CM

## 2021-04-06 MED ORDER — ALVESCO 80 MCG/ACT IN AERS: 2.0000 | INHALATION_SPRAY | Freq: Two times a day (BID) | RESPIRATORY_TRACT | 5 refills | Status: DC

## 2021-04-06 MED ORDER — EPINEPHRINE 0.3 MG/0.3ML IJ SOAJ: INTRAMUSCULAR | 1 refills | Status: DC

## 2021-04-06 MED ORDER — ALBUTEROL SULFATE HFA 108 (90 BASE) MCG/ACT IN AERS: INHALATION_SPRAY | RESPIRATORY_TRACT | 2 refills | Status: DC

## 2021-04-06 NOTE — Patient Instructions (Addendum)
   1. Continue Qvar Redihaler 2 puffs twice a day as needed.  Provided with samples of Alvesco inhaler with spacer device (this should be more cost effective than Qvar and at most $25/inhaler if not covered at all by insurance).   Use Qvar or Alvesco interchangeably when needed.    If you are not meeting the below goals let me know and take Qvar  or Alvesco daily at that time for improved control.    2.  Continue Albuterol HFA 2 puffs every 4-6 hours if needed for cough, wheezing, shortness of breath or chest tightness.  Monitor frequency of use.   3. Consider nasal saline spray/gel for nasal symptoms  4. OTC antihistamine- Xyzal 5mg  daily (may take additional daily dose if needed for extra control)  5. Continue immunotherapy monthly and access to [place] or Epipen  Respiratory control goals:  Full participation in all desired activities (may need albuterol before activity) Albuterol use two time or less a week on average (not counting use with activity) Cough interfering with sleep two time or less a month Oral steroids no more than once a year No hospitalizations  6. Lung function looks great today  7. Return to clinic in 6-9 months or earlier if problem

## 2021-04-06 NOTE — Progress Notes (Signed)
Follow-up Note  RE: NANCYANN COTTERMAN MRN: [id number] DOB: 12-01-1955 Date of Office Visit: 04/06/2021   History of present illness: Jocelyn Kaufman is a 65 y.o. female presenting today for follow-up of asthma, allergic rhinitis and hymenoptera allergy.  She was last seen in the office on 10/06/20 by myself.   She has not had any major health changes, surgeries or hospitalizations since last visit.  Over the weekend she helped her sister with a community yard sale and was in the sun for a long time and states she developed short of breath, coughing and exhaustion.  She did not have shade where her tables were.  She had to use her rescue inhaler albuterol and symptoms lingered on into Tuesday.  She did initiate use of Qvar 2 puffs twice a day which was helpful.  She uses the Qvar as needed when she does have a bit of a flare. In April she states she worked in the yard without a mask and developed symptoms of shortness of breath and coughing where she also used her Qvar.   Denies nighttime awakenings.   Otherwise has not required ED/UC visits or systemic steroids.  She reports having itchy/watery eyes, nasal congestion/drainage and sneezing.  She states she forgot to get her allergy injection in May and she did come in last week to get back on them if she decided to file that she is coming weekly for a couple of weeks to get back up to maintenance full dosing.  She does take Xyzal daily. She does have access to epinephrine device that we will refill today. She has not had any sting's and does her best to avoid them.  Review of systems: Review of Systems  Constitutional:  Positive for malaise/fatigue. Negative for chills and fever.  HENT:         See HPI  Eyes:        See HPI  Respiratory:         See HPI  Cardiovascular: Negative.   Gastrointestinal: Negative.   Musculoskeletal: Negative.   Skin: Negative.   Neurological: Negative.    All other systems negative unless noted above in  HPI  Past medical/social/surgical/family history have been reviewed and are unchanged unless specifically indicated below.  No changes  Medication List: Current Outpatient Medications  Medication Sig Dispense Refill   Aflibercept (EYLEA) 2 MG/0.05ML SOLN 2 mg by Intravitreal route every 3 (three) months.     amLODipine (NORVASC) 5 MG tablet Take 2.5 mg by mouth daily.      aspirin EC 81 MG tablet Take 1 tablet (81 mg total) by mouth daily. Swallow whole. 30 tablet 11   atorvastatin (LIPITOR) 40 MG tablet Take 1 tablet (40 mg total) by mouth daily at 6 PM. 30 tablet 0   B-D ULTRAFINE III SHORT PEN 31G X 8 MM MISC SMARTSIG:1 Each SUB-Q Daily     beclomethasone (QVAR) 40 MCG/ACT inhaler Inhale 2 puffs into the lungs 2 (two) times daily. (Patient taking differently: Inhale 2 puffs into the lungs 2 (two) times daily as needed.) 10.6 g 5   ciclesonide (ALVESCO) 80 MCG/ACT inhaler Inhale 2 puffs into the lungs 2 (two) times daily. 1 each 5   EPINEPHrine (EPIPEN 2-PAK) 0.3 mg/0.3 mL IJ SOAJ injection Use as directed for life-threatening allergic reaction. 2 each 1   escitalopram (LEXAPRO) 20 MG tablet Take 20 mg by mouth daily.     ezetimibe (ZETIA) 10 MG tablet Take 1 tablet (10  mg total) by mouth daily.     ferrous sulfate 325 (65 FE) MG tablet Take 365 mg by mouth daily.      folic acid (FOLVITE) 1 MG tablet Take 1 mg by mouth 2 (two) times daily.      hydrALAZINE (APRESOLINE) 25 MG tablet Take 25 mg by mouth 3 (three) times daily.      insulin aspart protamine - aspart (NOVOLOG 70/30 FLEXPEN RELION) (70-30) 100 UNIT/ML FlexPen Inject 0.15-0.2 mLs (15-20 Units total) into the skin in the morning, at noon, and at bedtime. 15 mL 11   irbesartan (AVAPRO) 150 MG tablet Take 1 tablet (150 mg total) by mouth daily.     latanoprost (XALATAN) 0.005 % ophthalmic solution 1 drop at bedtime.     levocetirizine (XYZAL) 5 MG tablet Take 1 tablet (5 mg total) by mouth every evening.     levothyroxine  (SYNTHROID) 125 MCG tablet Take 125 mcg by mouth daily before breakfast. BRAND ONLY     LORazepam (ATIVAN) 0.5 MG tablet Take 0.5 mg by mouth as needed for anxiety.     metFORMIN (GLUCOPHAGE-XR) 500 MG 24 hr tablet Take 2 tablets (1,000 mg total) by mouth daily. Take 1 or 2 tablets based on sugars     Methotrexate Sodium (METHOTREXATE, PF,) 50 MG/2ML injection Inject 1 mL (25 mg total) into the muscle once a week. 0.9 ml once a week 2 mL    metoprolol succinate (TOPROL-XL) 25 MG 24 hr tablet TAKE 1 TABLET(25 MG) BY MOUTH DAILY 30 tablet 0   Multiple Vitamins-Minerals (CENTRUM SILVER PO) Take 1 tablet by mouth daily.     sulfaSALAzine (AZULFIDINE) 500 MG tablet sulfasalazine 500 mg tablet     TRULICITY 3 OE/4.2PN SOPN Inject 0.75 mLs (4.5 mg total) into the skin once a week.     vitamin B-12 (CYANOCOBALAMIN) 100 MCG tablet Take 100 mcg by mouth daily.      VITAMIN D, ERGOCALCIFEROL, PO Take 2,000 mg by mouth 2 (two) times daily.     albuterol (VENTOLIN HFA) 108 (90 Base) MCG/ACT inhaler Inhale two puffs every four to six hours as needed for cough or wheeze. 18 g 2   insulin glargine, 2 Unit Dial, (TOUJEO MAX SOLOSTAR) 300 UNIT/ML Solostar Pen Inject 46 Units into the skin daily. (Patient not taking: Reported on 04/06/2021)     No current facility-administered medications for this visit.     Known medication allergies: Allergies  Allergen Reactions   Jardiance [Empagliflozin] Itching and Swelling   Latex    Invokana [Canagliflozin] Other (See Comments)    Urinary incontinence   Percocet [Oxycodone-Acetaminophen] Nausea And Vomiting   Prilosec [Omeprazole] Rash   Rogaine [Minoxidil] Rash   Victoza [Liraglutide] Itching     Physical examination: Blood pressure 130/62, pulse 79, temperature 97.6 F (36.4 C), temperature source Temporal, resp. rate 14, height 5\' 4"  (1.626 m), weight 245 lb 12.8 oz (111.5 kg), SpO2 98 %.  General: Alert, interactive, in no acute distress. HEENT: PERRLA, TMs  pearly gray, turbinates non-edematous without discharge, post-pharynx non erythematous. Neck: Supple without lymphadenopathy. Lungs: Clear to auscultation without wheezing, rhonchi or rales. {no increased work of breathing. CV: Normal S1, S2 without murmurs. Abdomen: Nondistended, nontender. Skin: Warm and dry, without lesions or rashes. Extremities:  No clubbing, cyanosis or edema. Neuro:   Grossly intact.  Diagnositics/Labs:  Spirometry: FEV1: 1.83 L 92%, FVC: 2.14 L 84%, ratio consistent with nonobstructive pattern  Assessment and plan: Mild intermittent asthma-has had couple of  mild flare since last visit however has not required any systemic steroids or ED or urgent care visits.  Her control is still good at this time.  We will continue the current plan as below  Allergic rhinitis-continue allergy immunotherapy at monthly dosing at this time.  Continue current regimen as below. Hymenoptera allergy-continue avoidance of stinging insects and have epinephrine available   1. Continue Qvar Redihaler 2 puffs twice a day as needed.  Provided with samples of Alvesco inhaler with spacer device (this should be more cost effective than Qvar and at most $25/inhaler if not covered at all by insurance).   Use Qvar or Alvesco interchangeably when needed.    If you are not meeting the below goals let me know and take Qvar  or Alvesco daily at that time for improved control.    2.  Continue Albuterol HFA 2 puffs every 4-6 hours if needed for cough, wheezing, shortness of breath or chest tightness.  Monitor frequency of use.   3. Consider nasal saline spray/gel for nasal symptoms  4. OTC antihistamine- Xyzal 5mg  daily (may take additional daily dose if needed for extra control)  5. Continue immunotherapy monthly and access to [place] or Epipen  Respiratory control goals:  Full participation in all desired activities (may need albuterol before activity) Albuterol use two time or less a week on average  (not counting use with activity) Cough interfering with sleep two time or less a month Oral steroids no more than once a year No hospitalizations  6. Lung function looks great today  7. Return to clinic in 6-9 months or earlier if problem  I appreciate the opportunity to take part in [place] care. Please do not hesitate to contact me with questions.  Sincerely,   Prudy Feeler, MD Allergy/Immunology [facility]

## 2021-04-12 NOTE — Assessment & Plan Note (Signed)
Patient has history of hypothyroidism as well as possible adrenal dysfunction issues for which she goes to [facility]  Previous blood work review: 1.Erythropoietin: 24.2 2. SPEP:No M protein 3.Haptoglobin: 146, LDH 281, absolute reticulocyte count 96.4, direct Coombs test negative 4.B12 750 5.Folic acid 74.4 6.Ferritin 88, iron saturation 25 7.Hemoglobin 9.6 with MCV 89 ----------------------------------------------------------------------------------------------------------------------------------------------- Etiology: Anemia of chronic disease secondary tochronic kidney disease,hypothyroidism and adrenal issues  Current treatment:Aranesp300 mcg started7/06/2020. Lab review 03/23/2020: Erythropoietin 27.9, hemoglobin 9.6 May 27, 2020:Hemoglobin 10.6 06/17/2020:Hemoglobin 10, MCV 89.6 07/15/2020: Hemoglobin 10.3 08/12/2020:Hb 10.6 10/27/20: Hb 9.1 12/09/19: Hb 9.2 01/05/21: Hb 9.9 02/02/2021: Hemoglobin 10.6 (given her severe symptoms I recommended to proceed with her Aranesp injection today)  Change Aranesp to q 5 weeks  We will set up Aranesp injection appointments every 5 weeks and I will see her back in 10 weeks.

## 2021-04-12 NOTE — Progress Notes (Signed)
Patient Care Team: Deland Pretty, MD as PCP - General (Internal Medicine) Debara Pickett Nadean Corwin, MD as PCP - Cardiology (Cardiology)  DIAGNOSIS:    ICD-10-CM   1. Anemia in stage 3 chronic kidney disease, unspecified whether stage 3a or 3b CKD (HCC)  N18.30    D63.1         CHIEF COMPLIANT: Follow-up of anemia of chronic kidney disease on Aranesp injections  INTERVAL HISTORY: Jocelyn Kaufman is a 65 y.o. with above-mentioned history of Jocelyn Kaufman is a 65 y.o. with above-mentioned history of anemia of chronic kidney disease who is currently receiving Aranesp injections (last 03/09/21). She presents to the clinic today for follow-up.  She continues to feel fatigued.  Denies any other new problems.  She does have diabetes and she is starting on Ozempic.  ALLERGIES:  is allergic to jardiance [empagliflozin], latex, invokana [canagliflozin], percocet [oxycodone-acetaminophen], prilosec [omeprazole], rogaine [minoxidil], and victoza [liraglutide].  MEDICATIONS:  Current Outpatient Medications  Medication Sig Dispense Refill   Semaglutide, 1 MG/DOSE, (OZEMPIC, 1 MG/DOSE,) 4 MG/3ML SOPN Inject 1 mg into the skin once a week.     Aflibercept (EYLEA) 2 MG/0.05ML SOLN 2 mg by Intravitreal route every 3 (three) months.     albuterol (VENTOLIN HFA) 108 (90 Base) MCG/ACT inhaler Inhale two puffs every four to six hours as needed for cough or wheeze. 18 g 2   amLODipine (NORVASC) 5 MG tablet Take 2.5 mg by mouth daily.      aspirin EC 81 MG tablet Take 1 tablet (81 mg total) by mouth daily. Swallow whole. 30 tablet 11   atorvastatin (LIPITOR) 40 MG tablet Take 1 tablet (40 mg total) by mouth daily at 6 PM. 30 tablet 0   B-D ULTRAFINE III SHORT PEN 31G X 8 MM MISC SMARTSIG:1 Each SUB-Q Daily     ciclesonide (ALVESCO) 80 MCG/ACT inhaler Inhale 2 puffs into the lungs 2 (two) times daily. 1 each 5   EPINEPHrine (EPIPEN 2-PAK) 0.3 mg/0.3 mL IJ SOAJ injection Use as directed for life-threatening allergic  reaction. 2 each 1   escitalopram (LEXAPRO) 20 MG tablet Take 20 mg by mouth daily.     ezetimibe (ZETIA) 10 MG tablet Take 1 tablet (10 mg total) by mouth daily.     ferrous sulfate 325 (65 FE) MG tablet Take 365 mg by mouth daily.      folic acid (FOLVITE) 1 MG tablet Take 1 mg by mouth 2 (two) times daily.      hydrALAZINE (APRESOLINE) 25 MG tablet Take 25 mg by mouth 3 (three) times daily.      insulin aspart protamine - aspart (NOVOLOG 70/30 FLEXPEN RELION) (70-30) 100 UNIT/ML FlexPen Inject 0.15-0.2 mLs (15-20 Units total) into the skin in the morning, at noon, and at bedtime. 15 mL 11   insulin glargine, 2 Unit Dial, (TOUJEO MAX SOLOSTAR) 300 UNIT/ML Solostar Pen Inject 46 Units into the skin daily. (Patient not taking: Reported on 04/06/2021)     irbesartan (AVAPRO) 150 MG tablet Take 1 tablet (150 mg total) by mouth daily.     latanoprost (XALATAN) 0.005 % ophthalmic solution 1 drop at bedtime.     levocetirizine (XYZAL) 5 MG tablet Take 1 tablet (5 mg total) by mouth every evening.     levothyroxine (SYNTHROID) 125 MCG tablet Take 125 mcg by mouth daily before breakfast. BRAND ONLY     LORazepam (ATIVAN) 0.5 MG tablet Take 0.5 mg by mouth as needed for anxiety.  Methotrexate Sodium (METHOTREXATE, PF,) 50 MG/2ML injection Inject 1 mL (25 mg total) into the muscle once a week. 1 ml once a week 2 mL    metoprolol succinate (TOPROL-XL) 25 MG 24 hr tablet TAKE 1 TABLET(25 MG) BY MOUTH DAILY 30 tablet 0   Multiple Vitamins-Minerals (CENTRUM SILVER PO) Take 1 tablet by mouth daily.     sulfaSALAzine (AZULFIDINE) 500 MG tablet sulfasalazine 500 mg tablet     vitamin B-12 (CYANOCOBALAMIN) 100 MCG tablet Take 100 mcg by mouth daily.      VITAMIN D, ERGOCALCIFEROL, PO Take 2,000 mg by mouth 2 (two) times daily.     No current facility-administered medications for this visit.    PHYSICAL EXAMINATION: ECOG PERFORMANCE STATUS: 1 - Symptomatic but completely ambulatory  Vitals:   04/13/21 0957   BP: (!) 122/57  Pulse: 77  Resp: 18  Temp: 97.7 F (36.5 C)  SpO2: 99%   Filed Weights   04/13/21 0957  Weight: 244 lb 1.6 oz (110.7 kg)     LABORATORY DATA:  I have reviewed the data as listed CMP Latest Ref Rng & Units 10/07/2018 07/12/2018 07/11/2018  Glucose 65 - 99 mg/dL 123(H) 128(H) 143(H)  BUN 8 - 27 mg/dL 26 47(H) 43(H)  Creatinine 0.57 - 1.00 mg/dL 1.13(H) 1.35(H) 1.60(H)  Sodium 134 - 144 mmol/L 140 136 136  Potassium 3.5 - 5.2 mmol/L 5.1 4.6 4.3  Chloride 96 - 106 mmol/L 105 103 103  CO2 20 - 29 mmol/L 21 23 22   Calcium 8.7 - 10.3 mg/dL 9.4 8.7(L) 8.8(L)  Total Protein 6.5 - 8.1 g/dL - - -  Total Bilirubin 0.3 - 1.2 mg/dL - - -  Alkaline Phos 38 - 126 U/L - - -  AST 15 - 41 U/L - - -  ALT 0 - 44 U/L - - -    Lab Results  Component Value Date   WBC 6.2 04/13/2021   HGB 10.1 (L) 04/13/2021   HCT 33.8 (L) 04/13/2021   MCV 92.1 04/13/2021   PLT 197 04/13/2021   NEUTROABS PENDING 04/13/2021    ASSESSMENT & PLAN:  Anemia in chronic kidney disease Patient has history of hypothyroidism as well as possible adrenal dysfunction issues for which she goes to [facility]   Previous blood work review: 1.  Erythropoietin: 24.2 2. SPEP: No M protein 3.  Haptoglobin: 146, LDH 281, absolute reticulocyte count 96.4, direct Coombs test negative 4.  B12 750 5.  Folic acid 26.7 6.  Ferritin 88, iron saturation 25 7. 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 Lab review 03/23/2020: Erythropoietin 27.9, hemoglobin 9.6 May 27, 2020: Hemoglobin 10.6 06/17/2020: Hemoglobin 10, MCV 89.6 07/15/2020: Hemoglobin 10.3 08/12/2020: Hb 10.6 12/09/19: Hb 9.2 02/02/2021: Hemoglobin 10.6 (given her severe  symptoms continue with Aranesp) 03/09/2021: Hemoglobin 9.7 04/13/2021: Hemoglobin 10.1  Change Aranesp to q 5 weeks   We will set up Aranesp injection appointments every 5 weeks and I will see her back in 10 weeks. Continue with every 5-week Aranesp injections.    No orders of the defined types were placed in this encounter.  The patient has a good understanding of the overall plan. she agrees with it. she will call with any problems that may develop before the next visit here.  Total time spent: 30 mins including face to face time and time spent for planning,  charting and coordination of care  Rulon Eisenmenger, MD, MPH 04/13/2021  I, Thana Ates, am acting as scribe for Dr. Nicholas Lose.  I have reviewed the above documentation for accuracy and completeness, and I agree with the above.

## 2021-04-13 ENCOUNTER — Inpatient Hospital Stay: Payer: Medicare PPO | Attending: Hematology and Oncology

## 2021-04-13 ENCOUNTER — Inpatient Hospital Stay (HOSPITAL_BASED_OUTPATIENT_CLINIC_OR_DEPARTMENT_OTHER): Payer: Medicare PPO | Admitting: Hematology and Oncology

## 2021-04-13 ENCOUNTER — Other Ambulatory Visit: Payer: Self-pay

## 2021-04-13 ENCOUNTER — Inpatient Hospital Stay: Payer: Medicare PPO

## 2021-04-13 VITALS — BP 143/71 | HR 74 | Resp 17

## 2021-04-13 DIAGNOSIS — D649 Anemia, unspecified: Secondary | ICD-10-CM

## 2021-04-13 DIAGNOSIS — D631 Anemia in chronic kidney disease: Secondary | ICD-10-CM | POA: Diagnosis not present

## 2021-04-13 DIAGNOSIS — N183 Chronic kidney disease, stage 3 unspecified: Secondary | ICD-10-CM | POA: Insufficient documentation

## 2021-04-13 DIAGNOSIS — D638 Anemia in other chronic diseases classified elsewhere: Secondary | ICD-10-CM

## 2021-04-13 LAB — CBC WITH DIFFERENTIAL (CANCER CENTER ONLY)
Abs Immature Granulocytes: 0.01 10*3/uL (ref 0.00–0.07)
Basophils Absolute: 0 10*3/uL (ref 0.0–0.1)
Basophils Relative: 1 %
Eosinophils Absolute: 0.1 10*3/uL (ref 0.0–0.5)
Eosinophils Relative: 2 %
HCT: 33.8 % — ABNORMAL LOW (ref 36.0–46.0)
Hemoglobin: 10.1 g/dL — ABNORMAL LOW (ref 12.0–15.0)
Immature Granulocytes: 0 %
Lymphocytes Relative: 23 %
Lymphs Abs: 1.4 10*3/uL (ref 0.7–4.0)
MCH: 27.5 pg (ref 26.0–34.0)
MCHC: 29.9 g/dL — ABNORMAL LOW (ref 30.0–36.0)
MCV: 92.1 fL (ref 80.0–100.0)
Monocytes Absolute: 0.2 10*3/uL (ref 0.1–1.0)
Monocytes Relative: 4 %
Neutro Abs: 4.3 10*3/uL (ref 1.7–7.7)
Neutrophils Relative %: 70 %
Platelet Count: 197 10*3/uL (ref 150–400)
RBC: 3.67 MIL/uL — ABNORMAL LOW (ref 3.87–5.11)
RDW: 16.1 % — ABNORMAL HIGH (ref 11.5–15.5)
WBC Count: 6.2 10*3/uL (ref 4.0–10.5)
nRBC: 0 % (ref 0.0–0.2)

## 2021-04-13 MED ORDER — DARBEPOETIN ALFA 300 MCG/0.6ML IJ SOSY
PREFILLED_SYRINGE | INTRAMUSCULAR | Status: AC
  Filled 2021-04-13: qty 0.6

## 2021-04-13 MED ORDER — METHOTREXATE SODIUM CHEMO INJECTION (PF) 50 MG/2ML: 25.0000 mg | INTRAMUSCULAR | Status: DC

## 2021-04-13 MED ORDER — DARBEPOETIN ALFA 300 MCG/0.6ML IJ SOSY
300.0000 ug | PREFILLED_SYRINGE | Freq: Once | INTRAMUSCULAR | Status: AC
  Administered 2021-04-13: 300 ug via SUBCUTANEOUS

## 2021-04-13 MED ORDER — OZEMPIC (1 MG/DOSE) 4 MG/3ML ~~LOC~~ SOPN: 1.0000 mg | PEN_INJECTOR | SUBCUTANEOUS | Status: DC

## 2021-04-14 ENCOUNTER — Ambulatory Visit (INDEPENDENT_AMBULATORY_CARE_PROVIDER_SITE_OTHER): Payer: Medicare PPO

## 2021-04-14 DIAGNOSIS — J309 Allergic rhinitis, unspecified: Secondary | ICD-10-CM | POA: Diagnosis not present

## 2021-04-21 ENCOUNTER — Ambulatory Visit (INDEPENDENT_AMBULATORY_CARE_PROVIDER_SITE_OTHER): Payer: Medicare PPO

## 2021-04-21 DIAGNOSIS — J309 Allergic rhinitis, unspecified: Secondary | ICD-10-CM | POA: Diagnosis not present

## 2021-04-27 ENCOUNTER — Ambulatory Visit (INDEPENDENT_AMBULATORY_CARE_PROVIDER_SITE_OTHER): Payer: Medicare PPO | Admitting: *Deleted

## 2021-04-27 DIAGNOSIS — J309 Allergic rhinitis, unspecified: Secondary | ICD-10-CM

## 2021-04-28 HISTORY — PX: OTHER SURGICAL HISTORY: SHX169

## 2021-05-05 ENCOUNTER — Encounter (HOSPITAL_BASED_OUTPATIENT_CLINIC_OR_DEPARTMENT_OTHER): Payer: Self-pay | Admitting: Family

## 2021-05-05 ENCOUNTER — Ambulatory Visit (HOSPITAL_BASED_OUTPATIENT_CLINIC_OR_DEPARTMENT_OTHER): Payer: Medicare PPO | Admitting: Family

## 2021-05-05 ENCOUNTER — Other Ambulatory Visit: Payer: Self-pay

## 2021-05-05 VITALS — BP 134/84 | HR 76 | Ht 64.0 in | Wt 246.0 lb

## 2021-05-05 DIAGNOSIS — I10 Essential (primary) hypertension: Secondary | ICD-10-CM | POA: Diagnosis not present

## 2021-05-05 DIAGNOSIS — D2261 Melanocytic nevi of right upper limb, including shoulder: Secondary | ICD-10-CM | POA: Diagnosis not present

## 2021-05-05 DIAGNOSIS — I472 Ventricular tachycardia: Secondary | ICD-10-CM

## 2021-05-05 DIAGNOSIS — R0789 Other chest pain: Secondary | ICD-10-CM

## 2021-05-05 DIAGNOSIS — L821 Other seborrheic keratosis: Secondary | ICD-10-CM | POA: Diagnosis not present

## 2021-05-05 DIAGNOSIS — R238 Other skin changes: Secondary | ICD-10-CM | POA: Diagnosis not present

## 2021-05-05 DIAGNOSIS — D485 Neoplasm of uncertain behavior of skin: Secondary | ICD-10-CM | POA: Diagnosis not present

## 2021-05-05 DIAGNOSIS — L708 Other acne: Secondary | ICD-10-CM | POA: Diagnosis not present

## 2021-05-05 DIAGNOSIS — I4729 Other ventricular tachycardia: Secondary | ICD-10-CM

## 2021-05-05 MED ORDER — METOPROLOL SUCCINATE ER 25 MG PO TB24: ORAL_TABLET | ORAL | 3 refills | Status: DC

## 2021-05-05 NOTE — Progress Notes (Signed)
Office Visit    Patient Name: Jocelyn Kaufman Date of Encounter: 05/05/2021  PCP:  Deland Pretty, MD   [facility]  Cardiologist:  Pixie Casino, MD  Advanced Practice Provider:  No care team member to display Electrophysiologist:  None    Chief Complaint    Jocelyn Kaufman is a 65 y.o. 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[place])    Depression    Diabetes mellitus without complication ([place])    Hyperlipidemia    Hypertension    Hypothyroidism    Meralgia paresthetica of right side 01/10/2017   Migraine    Pericardial effusion    Ulnar neuropathy at wrist, 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   Urticaria    Past Surgical History:  Procedure Laterality Date   ADENOIDECTOMY     APPENDECTOMY     CYST REMOVAL LEG Right    Removed from right thigh joint   ORIF HUMERUS FRACTURE Right 07/09/2018   Procedure: OPEN REDUCTION INTERNAL FIXATION (ORIF) HUMERAL SHAFT FRACTURE;  Surgeon: Hiram Gash, MD;  Location: [facility];  Service: Orthopedics;  Laterality: Right;   TONSILLECTOMY     TOTAL VAGINAL HYSTERECTOMY     UMBILICAL HERNIA REPAIR      Allergies  Allergies  Allergen Reactions   Jardiance [Empagliflozin] Itching and Swelling   Latex    Invokana [Canagliflozin] Other (See Comments)    Urinary incontinence   Percocet [Oxycodone-Acetaminophen] Nausea And Vomiting   Prilosec [Omeprazole] Rash   Rogaine [Minoxidil] Rash   Victoza [Liraglutide] Itching    History of Present Illness    Jocelyn Kaufman is a 65 y.o. 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 Debara Pickett.  She presents today for follow up. Reports no shortness of breath nor dyspnea on exertion.  No edema, orthopnea, PND. Reports no palpitations.  Tells me she will have an episode of chest pain approximately once every 2 months and these episodes are overall sporadic.  Most recent episode Sunday which lasted about 2 hours and was intermittent.  Chest pain occurred to the right of the manubrium, no alleviating or aggravating factors.  She does note that she has recently been working in the yard getting ready (fell during a storm and had no chest pain, pressure, tightness at that time.  We discussed that her chest pain is atypical for angina as it occurred at rest and may reflect musculoskeletal etiology.  EKGs/Labs/Other Studies Reviewed:   The following studies were reviewed today:   Echo 06/2018 Left ventricle: The cavity size was normal. There was mild    concentric hypertrophy. Systolic function was normal. The    estimated ejection fraction was in the range of 60% to 65%. Wall    motion was normal; there were no regional wall motion    abnormalities. Doppler parameters are consistent with abnormal    left ventricular relaxation (grade 1 diastolic dysfunction).  Doppler parameters are consistent with high ventricular filling    pressure.  - Aortic valve: Transvalvular velocity was within the normal range.    There was no stenosis. There was no regurgitation.  - Mitral valve: Calcified annulus. Transvalvular velocity was    within the normal range. There was no evidence for stenosis.    There was no regurgitation.  - Left atrium: The atrium was mildly dilated.  - Right ventricle: The cavity size was normal. Wall thickness was    normal. Systolic  function was normal.  - Tricuspid valve: There was trivial regurgitation.  - Pulmonary arteries: Systolic pressure was within the normal    range.  - Pericardium, extracardiac: A trivial pericardial effusion was    identified.   Myoview 09/2018 Nuclear stress EF: 57%. There was no ST segment deviation noted during stress. The study is normal. The left ventricular ejection fraction is normal (55-65%).   1. EF 57%, normal wall motion. 2. No evidence for ischemia or infarction on perfusion images.   Normal study.   EKG:  EKG is  ordered today.  The ekg ordered today demonstrates NSR 76 bpm with no acute ST/T wave changes.  Recent Labs: 04/13/2021: Hemoglobin 10.1; Platelet Count 197  Recent Lipid Panel    Component Value Date/Time   CHOL 221 (H) 07/07/2018 0514   TRIG 266 (H) 07/07/2018 0514   HDL 42 07/07/2018 0514   CHOLHDL 5.3 07/07/2018 0514   VLDL 53 (H) 07/07/2018 0514   LDLCALC 126 (H) 07/07/2018 0514    Home Medications   Current Meds  Medication Sig   Aflibercept (EYLEA) 2 MG/0.05ML SOLN 2 mg by Intravitreal route every 3 (three) months.   albuterol (VENTOLIN HFA) 108 (90 Base) MCG/ACT inhaler Inhale two puffs every four to six hours as needed for cough or wheeze.   amLODipine (NORVASC) 5 MG tablet Take 2.5 mg by mouth daily.    aspirin EC 81 MG tablet Take 1 tablet (81 mg total) by mouth daily. Swallow whole.   atorvastatin (LIPITOR) 40 MG tablet Take 1 tablet (40 mg total) by mouth daily at 6 PM.   B-D ULTRAFINE III SHORT PEN 31G X 8 MM MISC SMARTSIG:1 Each SUB-Q Daily   ciclesonide (ALVESCO) 80 MCG/ACT inhaler Inhale 2 puffs into the lungs 2 (two) times daily.   EPINEPHrine (EPIPEN 2-PAK) 0.3 mg/0.3 mL IJ SOAJ injection Use as directed for life-threatening allergic reaction.   escitalopram (LEXAPRO) 20 MG tablet Take 20 mg by mouth daily.   ezetimibe (ZETIA) 10 MG tablet Take 1 tablet (10 mg total) by mouth daily.   ferrous sulfate 325 (65 FE) MG tablet Take 365  mg by mouth daily.    folic acid (FOLVITE) 1 MG tablet Take 1 mg by mouth 2 (two) times daily.    hydrALAZINE (APRESOLINE) 25 MG tablet Take 25 mg by mouth 3 (three) times daily.    insulin aspart protamine - aspart (NOVOLOG 70/30 FLEXPEN RELION) (70-30) 100 UNIT/ML FlexPen Inject 0.15-0.2 mLs (15-20 Units total) into the skin in the morning, at noon, and at bedtime.   insulin glargine, 2 Unit Dial, (TOUJEO MAX SOLOSTAR) 300 UNIT/ML Solostar Pen Inject 46 Units into the skin daily.   irbesartan (AVAPRO) 150 MG tablet Take 1 tablet (150 mg total) by mouth daily.   latanoprost (XALATAN) 0.005 % ophthalmic solution 1 drop at bedtime.   levocetirizine (XYZAL) 5 MG tablet Take 1 tablet (5 mg total) by mouth every evening.   levothyroxine (SYNTHROID) 125  MCG tablet Take 125 mcg by mouth daily before breakfast. BRAND ONLY   LORazepam (ATIVAN) 0.5 MG tablet Take 0.5 mg by mouth as needed for anxiety.   Methotrexate Sodium (METHOTREXATE, PF,) 50 MG/2ML injection Inject 1 mL (25 mg total) into the muscle once a week. 1 ml once a week   Multiple Vitamins-Minerals (CENTRUM SILVER PO) Take 1 tablet by mouth daily.   Semaglutide, 1 MG/DOSE, (OZEMPIC, 1 MG/DOSE,) 4 MG/3ML SOPN Inject 1 mg into the skin once a week.   sulfaSALAzine (AZULFIDINE) 500 MG tablet sulfasalazine 500 mg tablet   vitamin B-12 (CYANOCOBALAMIN) 100 MCG tablet Take 100 mcg by mouth daily.    VITAMIN D, ERGOCALCIFEROL, PO Take 2,000 mg by mouth 2 (two) times daily.   [DISCONTINUED] metoprolol succinate (TOPROL-XL) 25 MG 24 hr tablet TAKE 1 TABLET(25 MG) BY MOUTH DAILY     Review of Systems      All other systems reviewed and are otherwise negative except as noted above.  Physical Exam    VS:  BP 134/84   Pulse 76   Ht 5\' 4"  (1.626 m)   Wt 246 lb (111.6 kg)   SpO2 97%   BMI 42.23 kg/m  , BMI Body mass index is 42.23 kg/m.  Wt Readings from Last 3 Encounters:  05/05/21 246 lb (111.6 kg)  04/13/21 244 lb 1.6 oz (110.7 kg)   04/06/21 245 lb 12.8 oz (111.5 kg)    GEN: Well nourished, overweight,  well developed, in no acute distress. HEENT: normal. Neck: Supple, no JVD, carotid bruits, or masses. Cardiac: RRR, no murmurs, rubs, or gallops. No clubbing, cyanosis, edema.  Radials/PT 2+ and equal bilaterally.  Respiratory:  Respirations regular and unlabored, clear to auscultation bilaterally. GI: Soft, nontender, nondistended. MS: No deformity or atrophy. Skin: Warm and dry, no rash. Neuro:  Strength and sensation are intact. Psych: Normal affect.  Assessment & Plan    NSVT / PVC - Low risk myoview 09/2018. Well controlled on Toprol 25mg  QD. Refill provided.  Atypical chest pain - Chest pain episode Sunday at rest which was intermittent for 2 hours. Self resolved. EKG today NSR with no acute ST/T wave changes. Low suspicion angina as symptoms are atypical. We agreed to defer ischemic evaluation at this time. She will contact our office if chest pain recurs.  DM2 - Continue to follow with PCP.   Disposition: Follow up in 1 year(s) with Dr. Debara Pickett or APP.  Signed, Loel Dubonnet, NP 05/05/2021, 9:39 AM [place]

## 2021-05-05 NOTE — Patient Instructions (Addendum)
Medication Instructions:  Continue your current medications.   *If you need a refill on your cardiac medications before your next appointment, please call your pharmacy*   Lab Work: None ordered today.   If you have labs (blood work) drawn today and your tests are completely normal, you will receive your results only by: [place] (if you have MyChart) OR A paper copy in the mail If you have any lab test that is abnormal or we need to change your treatment, we will call you to review the results.   Testing/Procedures: Your EKG today showed sinus rhythm which is a great result! No signs of blockage or plaque buildup.    Follow-Up: At [facility], you and your health needs are our priority.  As part of our continuing mission to provide you with exceptional heart care, we have created designated Provider Care Teams.  These Care Teams include your primary Cardiologist (physician) and Advanced Practice Providers (APPs -  Physician Assistants and Nurse Practitioners) who all work together to provide you with the care you need, when you need it.  We recommend signing up for the patient portal called "MyChart".  Sign up information is provided on this After Visit Summary.  MyChart is used to connect with patients for Virtual Visits (Telemedicine).  Patients are able to view lab/test results, encounter notes, upcoming appointments, etc.  Non-urgent messages can be sent to your provider as well.   To learn more about what you can do with MyChart, go to [email or web address].    Your next appointment:   1 year(s)  The format for your next appointment:   In Person  Provider:   You may see Pixie Casino, MD or one of the following Advanced Practice Providers on your designated Care Team:   Almyra Deforest, PA-C Fabian Sharp, PA-C or  Roby Lofts, [place]   Other Instructions  Heart Healthy Diet Recommendations: A low-salt diet is recommended. Meats should be grilled, baked, or  boiled. Avoid fried foods. Focus on lean protein sources like fish or chicken with vegetables and fruits. The American Heart Association is a Microbiologist!  American Heart Association Diet and Lifeystyle Recommendations   Exercise recommendations: The American Heart Association recommends 150 minutes of moderate intensity exercise weekly. Try 30 minutes of moderate intensity exercise 4-5 times per week. This could include walking, jogging, or swimming.

## 2021-05-08 DIAGNOSIS — E113491 Type 2 diabetes mellitus with severe nonproliferative diabetic retinopathy without macular edema, right eye: Secondary | ICD-10-CM | POA: Diagnosis not present

## 2021-05-12 DIAGNOSIS — M0579 Rheumatoid arthritis with rheumatoid factor of multiple sites without organ or systems involvement: Secondary | ICD-10-CM | POA: Diagnosis not present

## 2021-05-17 DIAGNOSIS — N182 Chronic kidney disease, stage 2 (mild): Secondary | ICD-10-CM | POA: Diagnosis not present

## 2021-05-18 ENCOUNTER — Other Ambulatory Visit: Payer: Self-pay

## 2021-05-18 ENCOUNTER — Inpatient Hospital Stay: Payer: Medicare PPO

## 2021-05-18 ENCOUNTER — Inpatient Hospital Stay: Payer: Medicare PPO | Attending: Hematology and Oncology

## 2021-05-18 VITALS — BP 151/68 | HR 72 | Temp 99.0°F | Resp 16

## 2021-05-18 DIAGNOSIS — D631 Anemia in chronic kidney disease: Secondary | ICD-10-CM | POA: Diagnosis not present

## 2021-05-18 DIAGNOSIS — N183 Chronic kidney disease, stage 3 unspecified: Secondary | ICD-10-CM

## 2021-05-18 DIAGNOSIS — D638 Anemia in other chronic diseases classified elsewhere: Secondary | ICD-10-CM

## 2021-05-18 DIAGNOSIS — D649 Anemia, unspecified: Secondary | ICD-10-CM

## 2021-05-18 LAB — CBC WITH DIFFERENTIAL (CANCER CENTER ONLY)
Abs Immature Granulocytes: 0.01 10*3/uL (ref 0.00–0.07)
Basophils Absolute: 0.1 10*3/uL (ref 0.0–0.1)
Basophils Relative: 1 %
Eosinophils Absolute: 0.1 10*3/uL (ref 0.0–0.5)
Eosinophils Relative: 2 %
HCT: 32.1 % — ABNORMAL LOW (ref 36.0–46.0)
Hemoglobin: 9.8 g/dL — ABNORMAL LOW (ref 12.0–15.0)
Immature Granulocytes: 0 %
Lymphocytes Relative: 26 %
Lymphs Abs: 1.8 10*3/uL (ref 0.7–4.0)
MCH: 28.5 pg (ref 26.0–34.0)
MCHC: 30.5 g/dL (ref 30.0–36.0)
MCV: 93.3 fL (ref 80.0–100.0)
Monocytes Absolute: 0.6 10*3/uL (ref 0.1–1.0)
Monocytes Relative: 8 %
Neutro Abs: 4.6 10*3/uL (ref 1.7–7.7)
Neutrophils Relative %: 63 %
Platelet Count: 200 10*3/uL (ref 150–400)
RBC: 3.44 MIL/uL — ABNORMAL LOW (ref 3.87–5.11)
RDW: 15.7 % — ABNORMAL HIGH (ref 11.5–15.5)
WBC Count: 7.2 10*3/uL (ref 4.0–10.5)
nRBC: 0 % (ref 0.0–0.2)

## 2021-05-18 MED ORDER — DARBEPOETIN ALFA 300 MCG/0.6ML IJ SOSY
300.0000 ug | PREFILLED_SYRINGE | Freq: Once | INTRAMUSCULAR | Status: AC
  Administered 2021-05-18: 300 ug via SUBCUTANEOUS

## 2021-05-18 MED ORDER — DARBEPOETIN ALFA 300 MCG/0.6ML IJ SOSY
PREFILLED_SYRINGE | INTRAMUSCULAR | Status: AC
  Filled 2021-05-18: qty 0.6

## 2021-05-18 NOTE — Patient Instructions (Signed)
Darbepoetin Alfa injection What is this medication? DARBEPOETIN ALFA (dar be POE e tin AL fa) helps your body make more red blood cells. It is used to treat anemia caused by chronic kidney failure and chemotherapy. This medicine may be used for other purposes; ask your health care provider or pharmacist if you have questions. COMMON BRAND NAME(S): Aranesp What should I tell my care team before I take this medication? They need to know if you have any of these conditions: blood clotting disorders or history of blood clots cancer patient not on chemotherapy cystic fibrosis heart disease, such as angina, heart failure, or a history of a heart attack hemoglobin level of 12 g/dL or greater high blood pressure low levels of folate, iron, or vitamin B12 seizures an unusual or allergic reaction to darbepoetin, erythropoietin, albumin, hamster proteins, latex, other medicines, foods, dyes, or preservatives pregnant or trying to get pregnant breast-feeding How should I use this medication? This medicine is for injection into a vein or under the skin. It is usually given by a health care professional in a hospital or clinic setting. If you get this medicine at home, you will be taught how to prepare and give this medicine. Use exactly as directed. Take your medicine at regular intervals. Do not take your medicine more often than directed. It is important that you put your used needles and syringes in a special sharps container. Do not put them in a trash can. If you do not have a sharps container, call your pharmacist or healthcare provider to get one. A special MedGuide will be given to you by the pharmacist with each prescription and refill. Be sure to read this information carefully each time. Talk to your pediatrician regarding the use of this medicine in children. While this medicine may be used in children as young as 1 month of age for selected conditions, precautions do apply. Overdosage: If you  think you have taken too much of this medicine contact a poison control center or emergency room at once. NOTE: This medicine is only for you. Do not share this medicine with others. What if I miss a dose? If you miss a dose, take it as soon as you can. If it is almost time for your next dose, take only that dose. Do not take double or extra doses. What may interact with this medication? Do not take this medicine with any of the following medications: epoetin alfa This list may not describe all possible interactions. Give your health care provider a list of all the medicines, herbs, non-prescription drugs, or dietary supplements you use. Also tell them if you smoke, drink alcohol, or use illegal drugs. Some items may interact with your medicine. What should I watch for while using this medication? Your condition will be monitored carefully while you are receiving this medicine. You may need blood work done while you are taking this medicine. This medicine may cause a decrease in vitamin B6. You should make sure that you get enough vitamin B6 while you are taking this medicine. Discuss the foods you eat and the vitamins you take with your health care professional. What side effects may I notice from receiving this medication? Side effects that you should report to your doctor or health care professional as soon as possible: allergic reactions like skin rash, itching or hives, swelling of the face, lips, or tongue breathing problems changes in vision chest pain confusion, trouble speaking or understanding feeling faint or lightheaded, falls high blood pressure   muscle aches or pains pain, swelling, warmth in the leg rapid weight gain severe headaches sudden numbness or weakness of the face, arm or leg trouble walking, dizziness, loss of balance or coordination seizures (convulsions) swelling of the ankles, feet, hands unusually weak or tired Side effects that usually do not require medical  attention (report to your doctor or health care professional if they continue or are bothersome): diarrhea fever, chills (flu-like symptoms) headaches nausea, vomiting redness, stinging, or swelling at site where injected This list may not describe all possible side effects. Call your doctor for medical advice about side effects. You may report side effects to FDA at 1-800-FDA-1088. Where should I keep my medication? Keep out of the reach of children. Store in a refrigerator between 2 and 8 degrees C (36 and 46 degrees F). Do not freeze. Do not shake. Throw away any unused portion if using a single-dose vial. Throw away any unused medicine after the expiration date. NOTE: This sheet is a summary. It may not cover all possible information. If you have questions about this medicine, talk to your doctor, pharmacist, or health care provider.  2022 Elsevier/Gold Standard (2017-10-30 16:44:20)  

## 2021-05-24 DIAGNOSIS — H401131 Primary open-angle glaucoma, bilateral, mild stage: Secondary | ICD-10-CM | POA: Diagnosis not present

## 2021-05-24 DIAGNOSIS — H25043 Posterior subcapsular polar age-related cataract, bilateral: Secondary | ICD-10-CM | POA: Diagnosis not present

## 2021-05-26 ENCOUNTER — Ambulatory Visit (INDEPENDENT_AMBULATORY_CARE_PROVIDER_SITE_OTHER): Payer: Medicare PPO

## 2021-05-26 DIAGNOSIS — N182 Chronic kidney disease, stage 2 (mild): Secondary | ICD-10-CM | POA: Diagnosis not present

## 2021-05-26 DIAGNOSIS — E1122 Type 2 diabetes mellitus with diabetic chronic kidney disease: Secondary | ICD-10-CM | POA: Diagnosis not present

## 2021-05-26 DIAGNOSIS — I129 Hypertensive chronic kidney disease with stage 1 through stage 4 chronic kidney disease, or unspecified chronic kidney disease: Secondary | ICD-10-CM | POA: Diagnosis not present

## 2021-05-26 DIAGNOSIS — R809 Proteinuria, unspecified: Secondary | ICD-10-CM | POA: Diagnosis not present

## 2021-05-26 DIAGNOSIS — J309 Allergic rhinitis, unspecified: Secondary | ICD-10-CM | POA: Diagnosis not present

## 2021-05-26 DIAGNOSIS — E1129 Type 2 diabetes mellitus with other diabetic kidney complication: Secondary | ICD-10-CM | POA: Diagnosis not present

## 2021-06-07 DIAGNOSIS — D485 Neoplasm of uncertain behavior of skin: Secondary | ICD-10-CM | POA: Diagnosis not present

## 2021-06-09 DIAGNOSIS — M0579 Rheumatoid arthritis with rheumatoid factor of multiple sites without organ or systems involvement: Secondary | ICD-10-CM | POA: Diagnosis not present

## 2021-06-14 DIAGNOSIS — E113512 Type 2 diabetes mellitus with proliferative diabetic retinopathy with macular edema, left eye: Secondary | ICD-10-CM | POA: Diagnosis not present

## 2021-06-20 DIAGNOSIS — Z79899 Other long term (current) drug therapy: Secondary | ICD-10-CM | POA: Diagnosis not present

## 2021-06-20 DIAGNOSIS — M1990 Unspecified osteoarthritis, unspecified site: Secondary | ICD-10-CM | POA: Diagnosis not present

## 2021-06-20 DIAGNOSIS — M797 Fibromyalgia: Secondary | ICD-10-CM | POA: Diagnosis not present

## 2021-06-20 DIAGNOSIS — D649 Anemia, unspecified: Secondary | ICD-10-CM | POA: Diagnosis not present

## 2021-06-20 DIAGNOSIS — M0579 Rheumatoid arthritis with rheumatoid factor of multiple sites without organ or systems involvement: Secondary | ICD-10-CM | POA: Diagnosis not present

## 2021-06-20 DIAGNOSIS — M25569 Pain in unspecified knee: Secondary | ICD-10-CM | POA: Diagnosis not present

## 2021-06-20 DIAGNOSIS — M1711 Unilateral primary osteoarthritis, right knee: Secondary | ICD-10-CM | POA: Diagnosis not present

## 2021-06-20 DIAGNOSIS — M79643 Pain in unspecified hand: Secondary | ICD-10-CM | POA: Diagnosis not present

## 2021-06-21 ENCOUNTER — Encounter: Payer: Self-pay | Admitting: Hematology and Oncology

## 2021-06-21 NOTE — Addendum Note (Signed)
Addended by: Neysa Hotter on: 06/21/2021 10:31 AM   Modules accepted: Orders

## 2021-06-21 NOTE — Assessment & Plan Note (Signed)
Patient has history of hypothyroidism as well as possible adrenal dysfunction issues for which [facility] goes to [facility]  Previous blood work review: 1.Erythropoietin: 24.2 2. SPEP:No M protein 3.Haptoglobin: 146, LDH 281, absolute reticulocyte count 96.4, direct Coombs test negative 4.B12 750 5.Folic acid 123XX123 6.Ferritin 88, iron saturation 25 7.Hemoglobin 9.6 with MCV 89 ----------------------------------------------------------------------------------------------------------------------------------------------- Etiology: Anemia of chronic disease secondary tochronic kidney disease,hypothyroidism and adrenal issues  Current treatment:Aranesp300 mcg started7/06/2020. Lab review 03/23/2020: Erythropoietin 27.9, hemoglobin 9.6 May 27, 2020:Hemoglobin 10.6 06/17/2020:Hemoglobin 10, MCV 89.6 07/15/2020: Hemoglobin 10.3 08/12/2020:Hb 10.6 12/09/19: Hb 9.2 02/02/2021: Hemoglobin 10.6 (given her severe symptoms continue with Aranesp) 03/09/2021: Hemoglobin 9.7 04/13/2021: Hemoglobin 10.1  ChangeAranesp to q 5weeks  We will set up Aranesp injection appointments every 5 weeks and I will see her back in 10 weeks. Continue with every 5-week Aranesp injections.

## 2021-06-21 NOTE — Progress Notes (Signed)
Patient Care Team: Deland Pretty, MD as PCP - General (Internal Medicine) Debara Pickett Nadean Corwin, MD as PCP - Cardiology (Cardiology)  DIAGNOSIS:    ICD-10-CM   1. Anemia in stage 3 chronic kidney disease, unspecified whether stage 3a or 3b CKD (HCC)  N18.30    D63.1        CHIEF COMPLIANT: Follow-up of anemia of chronic kidney disease on Aranesp injections  INTERVAL HISTORY: Jocelyn Kaufman is a 65 y.o. with above-mentioned history of anemia of chronic kidney disease who is currently receiving Aranesp injections (last 03/09/21). She presents to the clinic today for follow-up.  She reports that she feels less tired than usual.  ALLERGIES:  is allergic to jardiance [empagliflozin], latex, invokana [canagliflozin], percocet [oxycodone-acetaminophen], prilosec [omeprazole], rogaine [minoxidil], and victoza [liraglutide].  MEDICATIONS:  Current Outpatient Medications  Medication Sig Dispense Refill   Aflibercept (EYLEA) 2 MG/0.05ML SOLN 2 mg by Intravitreal route every 3 (three) months.     albuterol (VENTOLIN HFA) 108 (90 Base) MCG/ACT inhaler Inhale two puffs every four to six hours as needed for cough or wheeze. 18 g 2   amLODipine (NORVASC) 5 MG tablet Take 2.5 mg by mouth daily.      aspirin EC 81 MG tablet Take 1 tablet (81 mg total) by mouth daily. Swallow whole. 30 tablet 11   atorvastatin (LIPITOR) 40 MG tablet Take 1 tablet (40 mg total) by mouth daily at 6 PM. 30 tablet 0   B-D ULTRAFINE III SHORT PEN 31G X 8 MM MISC SMARTSIG:1 Each SUB-Q Daily     ciclesonide (ALVESCO) 80 MCG/ACT inhaler Inhale 2 puffs into the lungs 2 (two) times daily. 1 each 5   EPINEPHrine (EPIPEN 2-PAK) 0.3 mg/0.3 mL IJ SOAJ injection Use as directed for life-threatening allergic reaction. 2 each 1   escitalopram (LEXAPRO) 20 MG tablet Take 20 mg by mouth daily.     ezetimibe (ZETIA) 10 MG tablet Take 1 tablet (10 mg total) by mouth daily.     ferrous sulfate 325 (65 FE) MG tablet Take 365 mg by mouth daily.       folic acid (FOLVITE) 1 MG tablet Take 1 mg by mouth 2 (two) times daily.      hydrALAZINE (APRESOLINE) 25 MG tablet Take 25 mg by mouth 3 (three) times daily.      insulin aspart protamine - aspart (NOVOLOG 70/30 FLEXPEN RELION) (70-30) 100 UNIT/ML FlexPen Inject 0.15-0.2 mLs (15-20 Units total) into the skin in the morning, at noon, and at bedtime. 15 mL 11   insulin glargine, 2 Unit Dial, (TOUJEO MAX SOLOSTAR) 300 UNIT/ML Solostar Pen Inject 46 Units into the skin daily.     irbesartan (AVAPRO) 150 MG tablet Take 1 tablet (150 mg total) by mouth daily.     latanoprost (XALATAN) 0.005 % ophthalmic solution 1 drop at bedtime.     levocetirizine (XYZAL) 5 MG tablet Take 1 tablet (5 mg total) by mouth every evening.     levothyroxine (SYNTHROID) 125 MCG tablet Take 125 mcg by mouth daily before breakfast. BRAND ONLY     LORazepam (ATIVAN) 0.5 MG tablet Take 0.5 mg by mouth as needed for anxiety.     Methotrexate Sodium (METHOTREXATE, PF,) 50 MG/2ML injection Inject 1 mL (25 mg total) into the muscle once a week. 1 ml once a week 2 mL    metoprolol succinate (TOPROL-XL) 25 MG 24 hr tablet TAKE 1 TABLET(25 MG) BY MOUTH DAILY 90 tablet 3   Multiple Vitamins-Minerals (  CENTRUM SILVER PO) Take 1 tablet by mouth daily.     Semaglutide, 1 MG/DOSE, (OZEMPIC, 1 MG/DOSE,) 4 MG/3ML SOPN Inject 1 mg into the skin once a week.     sulfaSALAzine (AZULFIDINE) 500 MG tablet Take 1 tablet (500 mg total) by mouth 2 (two) times daily.     vitamin B-12 (CYANOCOBALAMIN) 100 MCG tablet Take 100 mcg by mouth daily.      VITAMIN D, ERGOCALCIFEROL, PO Take 2,000 mg by mouth 2 (two) times daily.     No current facility-administered medications for this visit.    PHYSICAL EXAMINATION: ECOG PERFORMANCE STATUS: 1 - Symptomatic but completely ambulatory  Vitals:   06/22/21 0855  BP: (!) 140/46  Pulse: 77  Resp: 19  Temp: (!) 97.5 F (36.4 C)  SpO2: 100%   Filed Weights   06/22/21 0855  Weight: 239 lb 11.2 oz  (108.7 kg)    LABORATORY DATA:  I have reviewed the data as listed CMP Latest Ref Rng & Units 10/07/2018 07/12/2018 07/11/2018  Glucose 65 - 99 mg/dL 123(H) 128(H) 143(H)  BUN 8 - 27 mg/dL 26 47(H) 43(H)  Creatinine 0.57 - 1.00 mg/dL 1.13(H) 1.35(H) 1.60(H)  Sodium 134 - 144 mmol/L 140 136 136  Potassium 3.5 - 5.2 mmol/L 5.1 4.6 4.3  Chloride 96 - 106 mmol/L 105 103 103  CO2 20 - 29 mmol/L '21 23 22  '$ Calcium 8.7 - 10.3 mg/dL 9.4 8.7(L) 8.8(L)  Total Protein 6.5 - 8.1 g/dL - - -  Total Bilirubin 0.3 - 1.2 mg/dL - - -  Alkaline Phos 38 - 126 U/L - - -  AST 15 - 41 U/L - - -  ALT 0 - 44 U/L - - -    Lab Results  Component Value Date   WBC 7.2 05/18/2021   HGB 11.3 (L) 06/22/2021   HCT 32.1 (L) 05/18/2021   MCV 93.3 05/18/2021   PLT 200 05/18/2021   NEUTROABS 4.6 05/18/2021    ASSESSMENT & PLAN:  Anemia in chronic kidney disease Patient has history of hypothyroidism as well as possible adrenal dysfunction issues for which [facility]   Previous blood work review: 1.  Erythropoietin: 24.2 2. SPEP: No M protein 3.  Haptoglobin: 146, LDH 281, absolute reticulocyte count 96.4, direct Coombs test negative 4.  B12 750 5.  Folic acid 123XX123 6.  Ferritin 88, iron saturation 25 7. 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 Lab review 03/23/2020: Erythropoietin 27.9, hemoglobin 9.6 May 27, 2020: Hemoglobin 10.6 06/17/2020: Hemoglobin 10, MCV 89.6 07/15/2020: Hemoglobin 10.3 08/12/2020: Hb 10.6 12/09/19: Hb 9.2 02/02/2021: Hemoglobin 10.6 (given her severe symptoms continue with Aranesp) 03/09/2021: Hemoglobin 9.7 04/13/2021: Hemoglobin 10.1 06/22/2021: Hemoglobin 11.3   Return to clinic monthly  for lab checks.  If she does not need every 4-week Aranesp injections we can discuss doing it every other month if her hemoglobin remains in good shape.  I will see her back in 3 months for labs and follow-up.  No orders of the defined types were placed in this encounter.  The patient has a good understanding of the overall plan. she agrees with it. she will call with any problems that may develop before the next visit here.  Total time spent: 20 mins including face to face time and time spent for planning, charting and coordination of care  Mete Purdum  Loyal Gambler, MD, MPH 06/22/2021  I, Thana Ates, am acting as scribe for Dr. Nicholas Lose.  I have reviewed the above documentation for accuracy and completeness, and I agree with the above.

## 2021-06-22 ENCOUNTER — Inpatient Hospital Stay: Payer: Medicare PPO | Attending: Hematology and Oncology

## 2021-06-22 ENCOUNTER — Ambulatory Visit: Payer: Medicare PPO

## 2021-06-22 ENCOUNTER — Other Ambulatory Visit: Payer: Self-pay

## 2021-06-22 ENCOUNTER — Inpatient Hospital Stay: Payer: Medicare PPO

## 2021-06-22 ENCOUNTER — Inpatient Hospital Stay: Payer: Medicare PPO | Admitting: Hematology and Oncology

## 2021-06-22 DIAGNOSIS — D631 Anemia in chronic kidney disease: Secondary | ICD-10-CM

## 2021-06-22 DIAGNOSIS — N183 Chronic kidney disease, stage 3 unspecified: Secondary | ICD-10-CM | POA: Diagnosis not present

## 2021-06-22 DIAGNOSIS — Z79899 Other long term (current) drug therapy: Secondary | ICD-10-CM | POA: Diagnosis not present

## 2021-06-22 DIAGNOSIS — D638 Anemia in other chronic diseases classified elsewhere: Secondary | ICD-10-CM

## 2021-06-22 DIAGNOSIS — D649 Anemia, unspecified: Secondary | ICD-10-CM

## 2021-06-22 LAB — HEMOGLOBIN: Hemoglobin: 11.3 g/dL — ABNORMAL LOW (ref 12.0–15.0)

## 2021-06-22 MED ORDER — SULFASALAZINE 500 MG PO TABS: 500.0000 mg | ORAL_TABLET | Freq: Two times a day (BID) | ORAL | Status: DC

## 2021-06-23 ENCOUNTER — Ambulatory Visit (INDEPENDENT_AMBULATORY_CARE_PROVIDER_SITE_OTHER): Payer: Medicare PPO | Admitting: *Deleted

## 2021-06-23 DIAGNOSIS — J309 Allergic rhinitis, unspecified: Secondary | ICD-10-CM | POA: Diagnosis not present

## 2021-06-26 DIAGNOSIS — D485 Neoplasm of uncertain behavior of skin: Secondary | ICD-10-CM | POA: Diagnosis not present

## 2021-07-03 DIAGNOSIS — G4733 Obstructive sleep apnea (adult) (pediatric): Secondary | ICD-10-CM | POA: Diagnosis not present

## 2021-07-10 DIAGNOSIS — E1169 Type 2 diabetes mellitus with other specified complication: Secondary | ICD-10-CM | POA: Diagnosis not present

## 2021-07-10 DIAGNOSIS — E782 Mixed hyperlipidemia: Secondary | ICD-10-CM | POA: Diagnosis not present

## 2021-07-10 DIAGNOSIS — E039 Hypothyroidism, unspecified: Secondary | ICD-10-CM | POA: Diagnosis not present

## 2021-07-11 ENCOUNTER — Other Ambulatory Visit: Payer: Self-pay | Admitting: Plastic Surgery

## 2021-07-11 DIAGNOSIS — D489 Neoplasm of uncertain behavior, unspecified: Secondary | ICD-10-CM | POA: Diagnosis not present

## 2021-07-11 DIAGNOSIS — D2372 Other benign neoplasm of skin of left lower limb, including hip: Secondary | ICD-10-CM | POA: Diagnosis not present

## 2021-07-17 DIAGNOSIS — E249 Cushing's syndrome, unspecified: Secondary | ICD-10-CM | POA: Diagnosis not present

## 2021-07-17 DIAGNOSIS — E278 Other specified disorders of adrenal gland: Secondary | ICD-10-CM | POA: Diagnosis not present

## 2021-07-17 DIAGNOSIS — E049 Nontoxic goiter, unspecified: Secondary | ICD-10-CM | POA: Diagnosis not present

## 2021-07-17 DIAGNOSIS — E1121 Type 2 diabetes mellitus with diabetic nephropathy: Secondary | ICD-10-CM | POA: Diagnosis not present

## 2021-07-17 DIAGNOSIS — G5793 Unspecified mononeuropathy of bilateral lower limbs: Secondary | ICD-10-CM | POA: Diagnosis not present

## 2021-07-17 DIAGNOSIS — E270 Other adrenocortical overactivity: Secondary | ICD-10-CM | POA: Diagnosis not present

## 2021-07-17 DIAGNOSIS — E039 Hypothyroidism, unspecified: Secondary | ICD-10-CM | POA: Diagnosis not present

## 2021-07-17 DIAGNOSIS — Z6841 Body Mass Index (BMI) 40.0 and over, adult: Secondary | ICD-10-CM | POA: Diagnosis not present

## 2021-07-17 DIAGNOSIS — R809 Proteinuria, unspecified: Secondary | ICD-10-CM | POA: Diagnosis not present

## 2021-07-19 ENCOUNTER — Ambulatory Visit (INDEPENDENT_AMBULATORY_CARE_PROVIDER_SITE_OTHER): Payer: Medicare PPO

## 2021-07-19 DIAGNOSIS — J309 Allergic rhinitis, unspecified: Secondary | ICD-10-CM | POA: Diagnosis not present

## 2021-07-24 ENCOUNTER — Other Ambulatory Visit: Payer: Self-pay

## 2021-07-24 ENCOUNTER — Inpatient Hospital Stay: Payer: Medicare PPO | Attending: Hematology and Oncology

## 2021-07-24 ENCOUNTER — Inpatient Hospital Stay: Payer: Medicare PPO

## 2021-07-24 VITALS — BP 145/68 | HR 76 | Temp 99.2°F | Resp 18

## 2021-07-24 DIAGNOSIS — D649 Anemia, unspecified: Secondary | ICD-10-CM

## 2021-07-24 DIAGNOSIS — D631 Anemia in chronic kidney disease: Secondary | ICD-10-CM

## 2021-07-24 DIAGNOSIS — N183 Chronic kidney disease, stage 3 unspecified: Secondary | ICD-10-CM

## 2021-07-24 DIAGNOSIS — D638 Anemia in other chronic diseases classified elsewhere: Secondary | ICD-10-CM

## 2021-07-24 LAB — CBC WITH DIFFERENTIAL (CANCER CENTER ONLY)
Abs Immature Granulocytes: 0.02 10*3/uL (ref 0.00–0.07)
Basophils Absolute: 0 10*3/uL (ref 0.0–0.1)
Basophils Relative: 1 %
Eosinophils Absolute: 0.1 10*3/uL (ref 0.0–0.5)
Eosinophils Relative: 1 %
HCT: 34.1 % — ABNORMAL LOW (ref 36.0–46.0)
Hemoglobin: 10.3 g/dL — ABNORMAL LOW (ref 12.0–15.0)
Immature Granulocytes: 0 %
Lymphocytes Relative: 26 %
Lymphs Abs: 1.6 10*3/uL (ref 0.7–4.0)
MCH: 27.2 pg (ref 26.0–34.0)
MCHC: 30.2 g/dL (ref 30.0–36.0)
MCV: 90.2 fL (ref 80.0–100.0)
Monocytes Absolute: 0.3 10*3/uL (ref 0.1–1.0)
Monocytes Relative: 5 %
Neutro Abs: 4 10*3/uL (ref 1.7–7.7)
Neutrophils Relative %: 67 %
Platelet Count: 211 10*3/uL (ref 150–400)
RBC: 3.78 MIL/uL — ABNORMAL LOW (ref 3.87–5.11)
RDW: 15 % (ref 11.5–15.5)
WBC Count: 6 10*3/uL (ref 4.0–10.5)
nRBC: 0 % (ref 0.0–0.2)

## 2021-07-24 MED ORDER — EPOETIN ALFA-EPBX 40000 UNIT/ML IJ SOLN
40000.0000 [IU] | Freq: Once | INTRAMUSCULAR | Status: AC
  Administered 2021-07-24: 40000 [IU] via SUBCUTANEOUS
  Filled 2021-07-24: qty 1

## 2021-07-25 DIAGNOSIS — M0579 Rheumatoid arthritis with rheumatoid factor of multiple sites without organ or systems involvement: Secondary | ICD-10-CM | POA: Diagnosis not present

## 2021-07-26 NOTE — Progress Notes (Signed)
VIALS MADE. EXP 07-26-22 

## 2021-07-27 DIAGNOSIS — J301 Allergic rhinitis due to pollen: Secondary | ICD-10-CM | POA: Diagnosis not present

## 2021-07-28 DIAGNOSIS — J3089 Other allergic rhinitis: Secondary | ICD-10-CM | POA: Diagnosis not present

## 2021-08-03 DIAGNOSIS — D485 Neoplasm of uncertain behavior of skin: Secondary | ICD-10-CM | POA: Diagnosis not present

## 2021-08-04 DIAGNOSIS — H25043 Posterior subcapsular polar age-related cataract, bilateral: Secondary | ICD-10-CM | POA: Diagnosis not present

## 2021-08-04 DIAGNOSIS — H401131 Primary open-angle glaucoma, bilateral, mild stage: Secondary | ICD-10-CM | POA: Diagnosis not present

## 2021-08-14 DIAGNOSIS — D485 Neoplasm of uncertain behavior of skin: Secondary | ICD-10-CM | POA: Diagnosis not present

## 2021-08-14 DIAGNOSIS — E113491 Type 2 diabetes mellitus with severe nonproliferative diabetic retinopathy without macular edema, right eye: Secondary | ICD-10-CM | POA: Diagnosis not present

## 2021-08-15 DIAGNOSIS — M1990 Unspecified osteoarthritis, unspecified site: Secondary | ICD-10-CM | POA: Diagnosis not present

## 2021-08-15 DIAGNOSIS — M79643 Pain in unspecified hand: Secondary | ICD-10-CM | POA: Diagnosis not present

## 2021-08-15 DIAGNOSIS — D649 Anemia, unspecified: Secondary | ICD-10-CM | POA: Diagnosis not present

## 2021-08-15 DIAGNOSIS — M797 Fibromyalgia: Secondary | ICD-10-CM | POA: Diagnosis not present

## 2021-08-15 DIAGNOSIS — M25569 Pain in unspecified knee: Secondary | ICD-10-CM | POA: Diagnosis not present

## 2021-08-15 DIAGNOSIS — M1711 Unilateral primary osteoarthritis, right knee: Secondary | ICD-10-CM | POA: Diagnosis not present

## 2021-08-15 DIAGNOSIS — M0579 Rheumatoid arthritis with rheumatoid factor of multiple sites without organ or systems involvement: Secondary | ICD-10-CM | POA: Diagnosis not present

## 2021-08-15 DIAGNOSIS — Z79899 Other long term (current) drug therapy: Secondary | ICD-10-CM | POA: Diagnosis not present

## 2021-08-15 DIAGNOSIS — Z23 Encounter for immunization: Secondary | ICD-10-CM | POA: Diagnosis not present

## 2021-08-18 ENCOUNTER — Ambulatory Visit (INDEPENDENT_AMBULATORY_CARE_PROVIDER_SITE_OTHER): Payer: Medicare PPO

## 2021-08-18 DIAGNOSIS — J309 Allergic rhinitis, unspecified: Secondary | ICD-10-CM | POA: Diagnosis not present

## 2021-08-21 DIAGNOSIS — H3582 Retinal ischemia: Secondary | ICD-10-CM | POA: Diagnosis not present

## 2021-08-21 DIAGNOSIS — E113491 Type 2 diabetes mellitus with severe nonproliferative diabetic retinopathy without macular edema, right eye: Secondary | ICD-10-CM | POA: Diagnosis not present

## 2021-08-21 DIAGNOSIS — H31093 Other chorioretinal scars, bilateral: Secondary | ICD-10-CM | POA: Diagnosis not present

## 2021-08-21 DIAGNOSIS — E113512 Type 2 diabetes mellitus with proliferative diabetic retinopathy with macular edema, left eye: Secondary | ICD-10-CM | POA: Diagnosis not present

## 2021-08-22 DIAGNOSIS — M0579 Rheumatoid arthritis with rheumatoid factor of multiple sites without organ or systems involvement: Secondary | ICD-10-CM | POA: Diagnosis not present

## 2021-08-23 ENCOUNTER — Inpatient Hospital Stay: Payer: Medicare PPO | Attending: Hematology and Oncology

## 2021-08-23 ENCOUNTER — Inpatient Hospital Stay: Payer: Medicare PPO

## 2021-08-23 ENCOUNTER — Other Ambulatory Visit: Payer: Self-pay

## 2021-08-23 VITALS — BP 157/73 | HR 76 | Temp 99.4°F | Resp 17

## 2021-08-23 DIAGNOSIS — D638 Anemia in other chronic diseases classified elsewhere: Secondary | ICD-10-CM

## 2021-08-23 DIAGNOSIS — D631 Anemia in chronic kidney disease: Secondary | ICD-10-CM | POA: Diagnosis not present

## 2021-08-23 DIAGNOSIS — D649 Anemia, unspecified: Secondary | ICD-10-CM

## 2021-08-23 DIAGNOSIS — N183 Chronic kidney disease, stage 3 unspecified: Secondary | ICD-10-CM | POA: Diagnosis not present

## 2021-08-23 LAB — CBC WITH DIFFERENTIAL (CANCER CENTER ONLY)
Abs Immature Granulocytes: 0.02 10*3/uL (ref 0.00–0.07)
Basophils Absolute: 0.1 10*3/uL (ref 0.0–0.1)
Basophils Relative: 1 %
Eosinophils Absolute: 0.1 10*3/uL (ref 0.0–0.5)
Eosinophils Relative: 2 %
HCT: 32.1 % — ABNORMAL LOW (ref 36.0–46.0)
Hemoglobin: 9.6 g/dL — ABNORMAL LOW (ref 12.0–15.0)
Immature Granulocytes: 0 %
Lymphocytes Relative: 22 %
Lymphs Abs: 1.3 10*3/uL (ref 0.7–4.0)
MCH: 27.8 pg (ref 26.0–34.0)
MCHC: 29.9 g/dL — ABNORMAL LOW (ref 30.0–36.0)
MCV: 93 fL (ref 80.0–100.0)
Monocytes Absolute: 0.4 10*3/uL (ref 0.1–1.0)
Monocytes Relative: 7 %
Neutro Abs: 4.2 10*3/uL (ref 1.7–7.7)
Neutrophils Relative %: 68 %
Platelet Count: 195 10*3/uL (ref 150–400)
RBC: 3.45 MIL/uL — ABNORMAL LOW (ref 3.87–5.11)
RDW: 15.4 % (ref 11.5–15.5)
WBC Count: 6.1 10*3/uL (ref 4.0–10.5)
nRBC: 0 % (ref 0.0–0.2)

## 2021-08-23 MED ORDER — EPOETIN ALFA-EPBX 40000 UNIT/ML IJ SOLN
40000.0000 [IU] | Freq: Once | INTRAMUSCULAR | Status: AC
  Administered 2021-08-23: 40000 [IU] via SUBCUTANEOUS
  Filled 2021-08-23: qty 1

## 2021-08-30 DIAGNOSIS — M797 Fibromyalgia: Secondary | ICD-10-CM | POA: Diagnosis not present

## 2021-08-30 DIAGNOSIS — D638 Anemia in other chronic diseases classified elsewhere: Secondary | ICD-10-CM | POA: Diagnosis not present

## 2021-08-30 DIAGNOSIS — M1990 Unspecified osteoarthritis, unspecified site: Secondary | ICD-10-CM | POA: Diagnosis not present

## 2021-08-30 DIAGNOSIS — Z0289 Encounter for other administrative examinations: Secondary | ICD-10-CM | POA: Diagnosis not present

## 2021-08-30 DIAGNOSIS — M0579 Rheumatoid arthritis with rheumatoid factor of multiple sites without organ or systems involvement: Secondary | ICD-10-CM | POA: Diagnosis not present

## 2021-08-30 DIAGNOSIS — E1121 Type 2 diabetes mellitus with diabetic nephropathy: Secondary | ICD-10-CM | POA: Diagnosis not present

## 2021-08-30 DIAGNOSIS — I10 Essential (primary) hypertension: Secondary | ICD-10-CM | POA: Diagnosis not present

## 2021-08-30 DIAGNOSIS — G4733 Obstructive sleep apnea (adult) (pediatric): Secondary | ICD-10-CM | POA: Diagnosis not present

## 2021-08-30 DIAGNOSIS — H269 Unspecified cataract: Secondary | ICD-10-CM | POA: Diagnosis not present

## 2021-09-06 DIAGNOSIS — H25043 Posterior subcapsular polar age-related cataract, bilateral: Secondary | ICD-10-CM | POA: Diagnosis not present

## 2021-09-06 DIAGNOSIS — H43813 Vitreous degeneration, bilateral: Secondary | ICD-10-CM | POA: Diagnosis not present

## 2021-09-06 DIAGNOSIS — H5213 Myopia, bilateral: Secondary | ICD-10-CM | POA: Diagnosis not present

## 2021-09-06 DIAGNOSIS — H33301 Unspecified retinal break, right eye: Secondary | ICD-10-CM | POA: Diagnosis not present

## 2021-09-11 DIAGNOSIS — H43311 Vitreous membranes and strands, right eye: Secondary | ICD-10-CM | POA: Diagnosis not present

## 2021-09-11 DIAGNOSIS — E113592 Type 2 diabetes mellitus with proliferative diabetic retinopathy without macular edema, left eye: Secondary | ICD-10-CM | POA: Diagnosis not present

## 2021-09-11 DIAGNOSIS — E113491 Type 2 diabetes mellitus with severe nonproliferative diabetic retinopathy without macular edema, right eye: Secondary | ICD-10-CM | POA: Diagnosis not present

## 2021-09-11 DIAGNOSIS — H35371 Puckering of macula, right eye: Secondary | ICD-10-CM | POA: Diagnosis not present

## 2021-09-11 DIAGNOSIS — H3582 Retinal ischemia: Secondary | ICD-10-CM | POA: Diagnosis not present

## 2021-09-11 DIAGNOSIS — H33321 Round hole, right eye: Secondary | ICD-10-CM | POA: Diagnosis not present

## 2021-09-13 DIAGNOSIS — E278 Other specified disorders of adrenal gland: Secondary | ICD-10-CM | POA: Diagnosis not present

## 2021-09-13 DIAGNOSIS — G5793 Unspecified mononeuropathy of bilateral lower limbs: Secondary | ICD-10-CM | POA: Diagnosis not present

## 2021-09-13 DIAGNOSIS — Z6841 Body Mass Index (BMI) 40.0 and over, adult: Secondary | ICD-10-CM | POA: Diagnosis not present

## 2021-09-13 DIAGNOSIS — E249 Cushing's syndrome, unspecified: Secondary | ICD-10-CM | POA: Diagnosis not present

## 2021-09-13 DIAGNOSIS — E049 Nontoxic goiter, unspecified: Secondary | ICD-10-CM | POA: Diagnosis not present

## 2021-09-13 DIAGNOSIS — E1121 Type 2 diabetes mellitus with diabetic nephropathy: Secondary | ICD-10-CM | POA: Diagnosis not present

## 2021-09-13 DIAGNOSIS — E039 Hypothyroidism, unspecified: Secondary | ICD-10-CM | POA: Diagnosis not present

## 2021-09-13 DIAGNOSIS — R809 Proteinuria, unspecified: Secondary | ICD-10-CM | POA: Diagnosis not present

## 2021-09-13 DIAGNOSIS — E270 Other adrenocortical overactivity: Secondary | ICD-10-CM | POA: Diagnosis not present

## 2021-09-18 DIAGNOSIS — H33321 Round hole, right eye: Secondary | ICD-10-CM | POA: Diagnosis not present

## 2021-09-19 ENCOUNTER — Ambulatory Visit (INDEPENDENT_AMBULATORY_CARE_PROVIDER_SITE_OTHER): Payer: Medicare PPO

## 2021-09-19 DIAGNOSIS — J309 Allergic rhinitis, unspecified: Secondary | ICD-10-CM

## 2021-09-20 DIAGNOSIS — M0579 Rheumatoid arthritis with rheumatoid factor of multiple sites without organ or systems involvement: Secondary | ICD-10-CM | POA: Diagnosis not present

## 2021-09-25 ENCOUNTER — Inpatient Hospital Stay: Payer: Medicare PPO

## 2021-09-25 ENCOUNTER — Other Ambulatory Visit: Payer: Self-pay

## 2021-09-25 ENCOUNTER — Inpatient Hospital Stay (HOSPITAL_BASED_OUTPATIENT_CLINIC_OR_DEPARTMENT_OTHER): Payer: Medicare PPO | Admitting: Adult Health

## 2021-09-25 ENCOUNTER — Inpatient Hospital Stay: Payer: Medicare PPO | Attending: Hematology and Oncology

## 2021-09-25 VITALS — BP 153/53 | HR 91 | Temp 97.5°F | Resp 18 | Ht 64.0 in | Wt 240.9 lb

## 2021-09-25 DIAGNOSIS — D631 Anemia in chronic kidney disease: Secondary | ICD-10-CM

## 2021-09-25 DIAGNOSIS — D649 Anemia, unspecified: Secondary | ICD-10-CM

## 2021-09-25 DIAGNOSIS — N183 Chronic kidney disease, stage 3 unspecified: Secondary | ICD-10-CM

## 2021-09-25 DIAGNOSIS — D638 Anemia in other chronic diseases classified elsewhere: Secondary | ICD-10-CM

## 2021-09-25 LAB — VITAMIN B12: Vitamin B-12: 1486 pg/mL — ABNORMAL HIGH (ref 180–914)

## 2021-09-25 LAB — CBC WITH DIFFERENTIAL (CANCER CENTER ONLY)
Abs Immature Granulocytes: 0.02 10*3/uL (ref 0.00–0.07)
Basophils Absolute: 0 10*3/uL (ref 0.0–0.1)
Basophils Relative: 1 %
Eosinophils Absolute: 0.2 10*3/uL (ref 0.0–0.5)
Eosinophils Relative: 2 %
HCT: 30 % — ABNORMAL LOW (ref 36.0–46.0)
Hemoglobin: 9.1 g/dL — ABNORMAL LOW (ref 12.0–15.0)
Immature Granulocytes: 0 %
Lymphocytes Relative: 18 %
Lymphs Abs: 1.5 10*3/uL (ref 0.7–4.0)
MCH: 28.2 pg (ref 26.0–34.0)
MCHC: 30.3 g/dL (ref 30.0–36.0)
MCV: 92.9 fL (ref 80.0–100.0)
Monocytes Absolute: 0.4 10*3/uL (ref 0.1–1.0)
Monocytes Relative: 5 %
Neutro Abs: 6.3 10*3/uL (ref 1.7–7.7)
Neutrophils Relative %: 74 %
Platelet Count: 222 10*3/uL (ref 150–400)
RBC: 3.23 MIL/uL — ABNORMAL LOW (ref 3.87–5.11)
RDW: 15.6 % — ABNORMAL HIGH (ref 11.5–15.5)
WBC Count: 8.5 10*3/uL (ref 4.0–10.5)
nRBC: 0 % (ref 0.0–0.2)

## 2021-09-25 LAB — RETIC PANEL
Immature Retic Fract: 34.1 % — ABNORMAL HIGH (ref 2.3–15.9)
RBC.: 3.26 MIL/uL — ABNORMAL LOW (ref 3.87–5.11)
Retic Count, Absolute: 122.9 10*3/uL (ref 19.0–186.0)
Retic Ct Pct: 3.8 % — ABNORMAL HIGH (ref 0.4–3.1)
Reticulocyte Hemoglobin: 24.9 pg — ABNORMAL LOW (ref >27.9)

## 2021-09-25 LAB — FOLATE: Folate: 62.2 ng/mL (ref >5.9)

## 2021-09-25 LAB — FERRITIN: Ferritin: 303 ng/mL (ref 11–307)

## 2021-09-25 MED ORDER — EPOETIN ALFA-EPBX 40000 UNIT/ML IJ SOLN
40000.0000 [IU] | Freq: Once | INTRAMUSCULAR | Status: AC
  Administered 2021-09-25: 11:00:00 40000 [IU] via SUBCUTANEOUS
  Filled 2021-09-25: qty 1

## 2021-09-25 NOTE — Progress Notes (Addendum)
[facility] Follow up:    Jocelyn Kaufman, [address] [place] [address]   DIAGNOSIS: Anemia of Chronic Disease  SUMMARY OF HEMATOLOGIC HISTORY:  Lab work-up results from 01/20/2018 1.  Erythropoietin: 24.2 2. SPEP: No M protein 3.  Haptoglobin: 146, LDH 281, absolute reticulocyte count 96.4, direct Coom[place] test negative 4.  B12 750 5.  Folic acid 87.5 6.  Ferritin 88, iron saturation 25 7. Hemoglobin 9.6 with MCV 89 2. Diagnosed with Anemia of Chronic Kidney Disease; began Aranesp 300 mcg every 3 weeks on 05/06/2020  (A) Changed to Retacrit 40,000 units every 4 weeks beginning 07/24/2021  CURRENT THERAPY: Retacrit  INTERVAL HISTORY: Jocelyn Kaufman 65 y.o. female returns for evaluation and management of her anemia of chronic kidney disease.  She most recently received Retacrit, 40,000 units on 08/23/2021.  She tolerates this well.  She changed to Retacrit 40,000 units every 4 weeks beginning on July 24, 2021.  At that time her hemoglobin was above 10.  Today it is 9.1.  Jocelyn Kaufman notes that she has had increasing fatigue and it is impacting her quality of life.  We have not drawn any additional lab work such as a ferritin, folate, or B12 in over a year.  We are following her hemoglobin noted below:    Latest Reference Range & Units 01/20/18 09:27 07/06/18 11:44 07/08/18 06:22 07/09/18 04:07 07/10/18 07:54 01/19/20 08:45 02/22/20 09:30 03/23/20 08:35 04/25/20 09:43 05/06/20 09:37 05/27/20 09:23 06/17/20 08:12 07/15/20 08:21 08/12/20 08:09 09/09/20 08:02 10/27/20 07:53 12/08/20 08:40 01/05/21 09:14 02/02/21 08:42 03/09/21 13:10 04/13/21 09:45 05/18/21 13:20 06/22/21 08:33 07/24/21 08:52 08/23/21 08:48  Hemoglobin 12.0 - 15.0 g/dL 9.6 (L) 10.3 (L) 9.2 (L) 10.6 (L) 8.8 (L) 10.3 (L) 10.2 (L) 9.8 (L) 9.6 (L) 9.9 (L) 10.6 (L) 10.0 (L) 10.3 (L) 10.6 (L) 9.6 (L) 9.1 (L) 9.2 (L) 9.9 (L) 10.6 (L) 9.7 (L) 10.1 (L) 9.8 (L) 11.3 (L) 10.3 (L) 9.6 (L)  (L): Data  is low  Care and also notes that she had a terrible reaction to her diabetic drug Ozempic.  She developed cataracts and her vision is blurred.  This has been a large source of frustration considering the coupled impact that her vision change and fatigue has had on her overall quality.  Patient Active Problem List   Diagnosis Date Noted   Anemia in chronic kidney disease 06/17/2020   Anemia of chronic disease 04/25/2020   Fall at home, initial encounter 07/06/2018   Displaced spiral fracture of shaft of humerus, right arm, initial encounter for closed fracture 07/06/2018   TIA (transient ischemic attack) 07/06/2018   Allergic rhinitis 07/04/2018   Normochromic normocytic anemia 01/20/2018   Rheumatoid arthritis ([place]) 04/30/2017   Lesion of left humerus 04/29/2017   Pericardial effusion 03/14/2017   Diabetes mellitus ([place]) 03/14/2017   Essential hypertension 03/14/2017   Mixed hyperlipidemia 03/14/2017   Ulnar neuropathy at wrist, 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   Meralgia paresthetica of right side 01/10/2017   Allergic rhinoconjunctivitis 07/09/2015   Asthma, well controlled 07/09/2015   Laryngopharyngeal reflux (LPR) 07/09/2015    is allergic to ozempic (0.25 or 0.5 mg-dose) [semaglutide(0.25 or 0.63m-dos)], jardiance [empagliflozin], latex, tramadol hcl, invokana [canagliflozin], percocet [oxycodone-acetaminophen], prilosec [omeprazole], rogaine [minoxidil], and victoza [liraglutide].  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   Urticaria     SURGICAL HISTORY: Past Surgical History:  Procedure Laterality Date   ADENOIDECTOMY     APPENDECTOMY     CYST REMOVAL  LEG Right    Removed from right thigh joint   ORIF HUMERUS FRACTURE Right 07/09/2018   Procedure: OPEN REDUCTION INTERNAL FIXATION (ORIF) HUMERAL SHAFT FRACTURE;  Surgeon: Hiram Gash, MD;  Location: [facility];  Service: Orthopedics;  Laterality: Right;   TONSILLECTOMY     TOTAL VAGINAL HYSTERECTOMY     UMBILICAL HERNIA REPAIR      SOCIAL HISTORY: Social History   Socioeconomic History   Marital status: Single    Spouse name: Not on file   Number of children: 0   Years of education: Masters   Highest education level: Not on file  Occupational History   Occupation: [employer]  Tobacco Use   Smoking status: Never   Smokeless tobacco: Never  Substance and Sexual Activity   Alcohol use: Not Currently    Comment: occ   Drug use: No   Sexual activity: Never  Other Topics Concern   Not on file  Social History Narrative   Lives alone   Caffeine use: 2 cups coffee per day   Tea sometimes   Soda- 3 x/week   Right-handed   Social Determinants of Health   Financial Resource Strain: Not on file  Food Insecurity: Not on file  Transportation Needs: Not on file  Physical Activity: Not on file  Stress: Not on file  Social Connections: Not on file  Intimate Partner Violence: Not on file    FAMILY HISTORY: Family History  Problem Relation Age of Onset   Hypertension Mother    Stroke Mother    Kidney failure Mother    Heart disease Mother        heart murmur   Glaucoma Mother    Stroke Father    COPD Father    Cancer - Prostate Father    Colon cancer Father     Review of Systems  Constitutional:  Negative for appetite change, chills, fatigue, fever and unexpected weight change.  HENT:   Negative for hearing loss, lump/mass and trouble swallowing.   Eyes:  Negative for eye problems and icterus.  Respiratory:  Negative for chest tightness, cough and shortness of breath.   Cardiovascular:  Negative for chest pain, leg swelling and palpitations.   Gastrointestinal:  Negative for abdominal distention, abdominal pain, constipation, diarrhea, nausea and vomiting.  Endocrine: Negative for hot flashes.  Genitourinary:  Negative for difficulty urinating.   Musculoskeletal:  Negative for arthralgias.  Skin:  Negative for itching and rash.  Neurological:  Negative for dizziness, extremity weakness, headaches and numbness.  Hematological:  Negative for adenopathy. Does not bruise/bleed easily.  Psychiatric/Behavioral:  Negative for depression. The patient is not nervous/anxious.      PHYSICAL EXAMINATION  ECOG PERFORMANCE STATUS: 2 - Symptomatic, <50% confined to bed  Vitals:   09/25/21 0936  BP: (!) 153/53  Pulse: 91  Resp: 18  Temp: (!) 97.5 F (36.4 C)  SpO2: 95%    Physical Exam Constitutional:      General: She is not in acute distress.    Appearance: Normal appearance. She is not toxic-appearing.  HENT:  Head: Normocephalic and atraumatic.  Eyes:     General: No scleral icterus. Cardiovascular:     Rate and Rhythm: Normal rate and regular rhythm.     Pulses: Normal pulses.     Heart sounds: Normal heart sounds.  Pulmonary:     Effort: Pulmonary effort is normal.     Breath sounds: Normal breath sounds.  Abdominal:     General: Abdomen is flat. Bowel sounds are normal. There is no distension.     Palpations: Abdomen is soft.     Tenderness: There is no abdominal tenderness.  Musculoskeletal:        General: No swelling.     Cervical back: Neck supple.  Lymphadenopathy:     Cervical: No cervical adenopathy.  Skin:    General: Skin is warm and dry.     Findings: No rash.  Neurological:     General: No focal deficit present.     Mental Status: She is alert.  Psychiatric:        Mood and Affect: Mood normal.        Behavior: Behavior normal.    LABORATORY DATA:  CBC    Component Value Date/Time   WBC 8.5 09/25/2021 0919   WBC 10.1 07/10/2018 0754   RBC 3.23 (L) 09/25/2021 0919   HGB 9.1 (L)  09/25/2021 0919   HCT 30.0 (L) 09/25/2021 0919   PLT 222 09/25/2021 0919   MCV 92.9 09/25/2021 0919   MCH 28.2 09/25/2021 0919   MCHC 30.3 09/25/2021 0919   RDW 15.6 (H) 09/25/2021 0919   LYMPHSABS 1.5 09/25/2021 0919   MONOABS 0.4 09/25/2021 0919   EOSABS 0.2 09/25/2021 0919   BASOSABS 0.0 09/25/2021 0919    CMP     Component Value Date/Time   NA 140 10/07/2018 0908   K 5.1 10/07/2018 0908   CL 105 10/07/2018 0908   CO2 21 10/07/2018 0908   GLUCOSE 123 (H) 10/07/2018 0908   GLUCOSE 128 (H) 07/12/2018 0752   BUN 26 10/07/2018 0908   CREATININE 1.13 (H) 10/07/2018 0908   CALCIUM 9.4 10/07/2018 0908   PROT 6.0 (L) 07/07/2018 0514   PROT 6.3 04/29/2017 1325   ALBUMIN 3.2 (L) 07/12/2018 0752   AST 30 07/07/2018 0514   ALT 47 (H) 07/07/2018 0514   ALKPHOS 60 07/07/2018 0514   BILITOT 0.7 07/07/2018 0514   GFRNONAA 52 (L) 10/07/2018 0908   GFRAA 60 10/07/2018 0908            ASSESSMENT and THERAPY PLAN:   Anemia in chronic kidney disease   ----------------------------------------------------------------------------------------------------------------------------------------------- Diagnosis: Anemia of chronic disease secondary to chronic kidney disease, hypothyroidism and adrenal issues   Current treatment: Aranesp 300 mcg started 05/06/2020; changed to Retacrit 40,000 units every 4 weeks beginning July 24, 2021.  Care met with myself and Dr. Payton Mccallum today.  She is understandably frustrated with the level of fatigue she is feeling.  We need to do a couple things.  First we are adding on labs with a ferritin, folate, B12 level, reticulocytes.  Should her ferritin be below 100, IV iron is indicated considering she is receiving an erythrocyte stimulating agent.  We will also replace other vitamin should those be deficient as well.  Additionally we will change the frequency of the Retacrit from every 4 weeks to every 2 weeks.  This will mean that she will have a lab and  injection every 2 weeks.  I will call her with the results of the labs  added on today.  And she will follow-up with Dr. Payton Mccallum in 8 weeks to review her progress.  It is interesting that her hemoglobin has declined and the change of erythrocyte stimulating agents.  So we will adjust timing and we are hopeful her hemoglobin will increase and her fatigue will decrease.   All questions were answered. The patient knows to call the clinic with any problems, questions or concerns. We can certainly see the patient much sooner if necessary.   Total encounter time: 30 minutes in face-to-face visit time, chart review, lab review, care coordination, order entry, and documentation of the encounter.  Wilber Bihari, NP 09/25/21 10:29 AM Medical Oncology and Hematology [facility] [address] [place], [place] [address] Tel. [phone]    Fax. [phone]  *Total Encounter Time as defined by the Centers for Medicare and Medicaid Services includes, in addition to the face-to-face time of a patient visit (documented in the note above) non-face-to-face time: obtaining and reviewing outside history, ordering and reviewing medications, tests or procedures, care coordination (communications with other health care professionals or caregivers) and documentation in the medical record.  Attending Note  I personally saw and examined Jocelyn Kaufman. The plan of care was discussed with her. I agree with the physical exam findings and assessment and plan as documented above. I performed the majority of the counseling and assessment and plan regarding this encounter 1 worsening anemia of chronic disease: We will switch her Retacrit to every 2 weeks 2. check iron L41 and folic acid levels Follow her back in 2 months Signed Harriette Ohara, MD

## 2021-09-25 NOTE — Assessment & Plan Note (Signed)
-----------------------------------------------------------------------------------------------------------------------------------------------  Diagnosis: Anemia of chronic disease secondary tochronic kidney disease,hypothyroidism and adrenal issues  Current treatment:Aranesp300 mcg started7/06/2020; changed to Retacrit 40,000 units every 4 weeks beginning July 24, 2021.  Care met with myself and Dr. Payton Mccallum today.  She is understandably frustrated with the level of fatigue she is feeling.  We need to do a couple things.  First we are adding on labs with a ferritin, folate, B12 level, reticulocytes.  Should her ferritin be below 100, IV iron is indicated considering she is receiving an erythrocyte stimulating agent.  We will also replace other vitamin should those be deficient as well.  Additionally we will change the frequency of the Retacrit from every 4 weeks to every 2 weeks.  This will mean that she will have a lab and injection every 2 weeks.  I will call her with the results of the labs added on today.  And she will follow-up with Dr. Payton Mccallum in 8 weeks to review her progress.  It is interesting that her hemoglobin has declined and the change of erythrocyte stimulating agents.  So we will adjust timing and we are hopeful her hemoglobin will increase and her fatigue will decrease.

## 2021-09-25 NOTE — Patient Instructions (Signed)
We are adding on labs today including: ferritin, b12, folate, and reticulocytes.  I will call you with the results.  We are changing the Retacrit injections to every 2 weeks.  You will be scheduled for lab and injection every 2 weeks moving forward.    You will follow-up with Dr. Lindi Adie in 8 weeks and discuss your hemoglobin and whether you should change therapy.    Anemia Anemia is a condition in which there is not enough red blood cells or hemoglobin in the blood. Hemoglobin is a substance in red blood cells that carries oxygen. When you do not have enough red blood cells or hemoglobin (are anemic), your body cannot get enough oxygen and your organs may not work properly. As a result, you may feel very tired or have other problems. What are the causes? Common causes of anemia include: Excessive bleeding. Anemia can be caused by excessive bleeding inside or outside the body, including bleeding from the intestines or from heavy menstrual periods in females. Poor nutrition. Long-lasting (chronic) kidney, thyroid, and liver disease. Bone marrow disorders, spleen problems, and blood disorders. Cancer and treatments for cancer. HIV (human immunodeficiency virus) and AIDS (acquired immunodeficiency syndrome). Infections, medicines, and autoimmune disorders that destroy red blood cells. What are the signs or symptoms? Symptoms of this condition include: Minor weakness. Dizziness. Headache, or difficulties concentrating and sleeping. Heartbeats that feel irregular or faster than normal (palpitations). Shortness of breath, especially with exercise. Pale skin, lips, and nails, or cold hands and feet. Indigestion and nausea. Symptoms may occur suddenly or develop slowly. If your anemia is mild, you may not have symptoms. How is this diagnosed? This condition is diagnosed based on blood tests, your medical history, and a physical exam. In some cases, a test may be needed in which cells are removed  from the soft tissue inside of a bone and looked at under a microscope (bone marrow biopsy). Your health care provider may also check your stool (feces) for blood and may do additional testing to look for the cause of your bleeding. Other tests may include: Imaging tests, such as a CT scan or MRI. A procedure to see inside your esophagus and stomach (endoscopy). A procedure to see inside your colon and rectum (colonoscopy). How is this treated? Treatment for this condition depends on the cause. If you continue to lose a lot of blood, you may need to be treated at a hospital. Treatment may include: Taking supplements of iron, vitamin D42, or folic acid. Taking a hormone medicine (erythropoietin) that can help to stimulate red blood cell growth. Having a blood transfusion. This may be needed if you lose a lot of blood. Making changes to your diet. Having surgery to remove your spleen. Follow these instructions at home: Take over-the-counter and prescription medicines only as told by your health care provider. Take supplements only as told by your health care provider. Follow any diet instructions that you were given by your health care provider. Keep all follow-up visits as told by your health care provider. This is important. Contact a health care provider if: You develop new bleeding anywhere in the body. Get help right away if: You are very weak. You are short of breath. You have pain in your abdomen or chest. You are dizzy or feel faint. You have trouble concentrating. You have bloody stools, black stools, or tarry stools. You vomit repeatedly or you vomit up blood. These symptoms may represent a serious problem that is an emergency. Do not  wait to see if the symptoms will go away. Get medical help right away. Call your local emergency services (911 in the [place] Do not drive yourself to the hospital. Summary Anemia is a condition in which you do not have enough red blood cells or  enough of a substance in your red blood cells that carries oxygen (hemoglobin). Symptoms may occur suddenly or develop slowly. If your anemia is mild, you may not have symptoms. This condition is diagnosed with blood tests, a medical history, and a physical exam. Other tests may be needed. Treatment for this condition depends on the cause of the anemia. This information is not intended to replace advice given to you by your health care provider. Make sure you discuss any questions you have with your health care provider. Document Revised: 09/22/2019 Document Reviewed: 09/22/2019 Elsevier Patient Education  2022 [employer].

## 2021-09-28 DIAGNOSIS — H2511 Age-related nuclear cataract, right eye: Secondary | ICD-10-CM | POA: Diagnosis not present

## 2021-09-28 DIAGNOSIS — H25041 Posterior subcapsular polar age-related cataract, right eye: Secondary | ICD-10-CM | POA: Diagnosis not present

## 2021-09-28 DIAGNOSIS — H25811 Combined forms of age-related cataract, right eye: Secondary | ICD-10-CM | POA: Diagnosis not present

## 2021-09-28 DIAGNOSIS — H25011 Cortical age-related cataract, right eye: Secondary | ICD-10-CM | POA: Diagnosis not present

## 2021-09-28 HISTORY — PX: OTHER SURGICAL HISTORY: SHX169

## 2021-09-28 HISTORY — PX: EYE SURGERY: SHX253

## 2021-10-05 ENCOUNTER — Encounter: Payer: Self-pay | Admitting: Allergy

## 2021-10-05 ENCOUNTER — Ambulatory Visit: Payer: Medicare PPO | Admitting: Allergy

## 2021-10-05 ENCOUNTER — Other Ambulatory Visit: Payer: Self-pay

## 2021-10-05 VITALS — BP 138/64 | HR 80 | Temp 97.9°F | Resp 16 | Ht 64.0 in | Wt 238.2 lb

## 2021-10-05 DIAGNOSIS — T781XXD Other adverse food reactions, not elsewhere classified, subsequent encounter: Secondary | ICD-10-CM

## 2021-10-05 DIAGNOSIS — J4520 Mild intermittent asthma, uncomplicated: Secondary | ICD-10-CM

## 2021-10-05 DIAGNOSIS — Z91038 Other insect allergy status: Secondary | ICD-10-CM

## 2021-10-05 DIAGNOSIS — J3089 Other allergic rhinitis: Secondary | ICD-10-CM

## 2021-10-05 MED ORDER — ALBUTEROL SULFATE HFA 108 (90 BASE) MCG/ACT IN AERS
INHALATION_SPRAY | RESPIRATORY_TRACT | 2 refills | Status: AC
Start: 2021-10-05 — End: ?

## 2021-10-05 NOTE — Patient Instructions (Addendum)
   1. Use [place] inhaler with spacer device.  Use 2 puffs twice a day during asthma flare or respiratory illnesses.    2.  Continue Albuterol HFA 2 puffs every 4-6 hours if needed for cough, wheezing, shortness of breath or chest tightness.  Monitor frequency of use.   3. Consider nasal saline spray/gel for nasal symptoms  4. OTC antihistamine- change to Allegra 180mg  at this time.  Stop Xyzal 5mg  daily as does not seem effective.   Recommend alternating every  6 months or so to ensure continued efficacy (may take additional daily dose if needed for extra control)  5. Continue immunotherapy monthly and access to [facility] or Epipen.  Will obtain environmental allergy panel to see if you have developed new allergens since being on immunotherapy.  This will also help determine if/when you can discontinue immunotherapy  6. Will obtain fish and shellfish panel.  You have access to epinephrine device as above  Respiratory control goals:  Full participation in all desired activities (may need albuterol before activity) Albuterol use two time or less a week on average (not counting use with activity) Cough interfering with sleep two time or less a month Oral steroids no more than once a year No hospitalizations   **We are ordering labs, so please allow 1-2 weeks for the results to come back.  With the newly implemented Cures Act, the labs might be visible to you at the same time that they become visible to me.  However, I will not address the results until all of the results come  back, so please be patient.  In the meantime, continue avoiding your triggering food(s) in your After Visit Summary, including avoidance measures (if applicable), until you hear from me about the results.

## 2021-10-05 NOTE — Progress Notes (Signed)
=polk   Follow-up Note  RE: CESIA ORF MRN: [id number] DOB: Apr 22, 1956 Date of Office Visit: 10/05/2021   History of present illness: Jocelyn Kaufman is a 65 y.o. female presenting today for follow-up of intermittent asthma, allergic rhinitis and hymenoptera allergy.  She also has adverse food reaction.  She was last seen in the office on 04/06/2021 by myself.  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    About 3 weeks ago she was having "extreme" shortness of breath, wheezing, cough for about 2 days.  The second day she did use the Alvesco inhaler and did have improvement in symptoms.   She does feel like she has used Alvesco at least for 2 other respiratory episodes for about a day or so at a time.   She has not required ED or urgent care visits or systemic steroid needs for asthma symptoms.  She does report nasal congestion in the mornings that clears throughout the day.  She does note a tickle in the throat, sneezing and cough periodically. She does state cat that has taken residence outside of her house.  Her last allergy testing was prior to 2018 when her immunotherapy was started.  She is at monthly dosing.  She does take Xyzal daily and is questionable if noticing any improvement at this time.  She does react to shellfish.  She has throat closure with ingestion and thus avoids.  She avoids all fish but trout and canned tuna.  Fish can cause itchy throat.  She does have an epinephrine device.  Review of systems in the past 4 weeks: Review of Systems  Constitutional: Negative.   HENT:  Positive for congestion.   Eyes: Negative.   Respiratory:  Positive for cough and shortness of breath.   Cardiovascular: Negative.   Gastrointestinal: Negative.   Musculoskeletal: Negative.   Skin: Negative.   Allergic/Immunologic: Negative.   Neurological: Negative.     All other systems negative unless noted above in  HPI  Past medical/social/surgical/family history have been reviewed and are unchanged unless specifically indicated below.  No changes  Medication List: Current Outpatient Medications  Medication Sig Dispense Refill   Abatacept (ORENCIA IV) Inject 1 Package into the vein every 30 (thirty) days. (unsure of dosage)     Aflibercept (EYLEA) 2 MG/0.05ML SOLN 2 mg by Intravitreal route every 3 (three) months.     albuterol (VENTOLIN HFA) 108 (90 Base) MCG/ACT inhaler Inhale two puffs every four to six hours as needed for cough or wheeze. 18 g 2   amLODipine (NORVASC) 5 MG tablet Take 2.5 mg by mouth daily.      aspirin EC 81 MG tablet Take 1 tablet (81 mg total) by mouth daily. Swallow whole. 30 tablet 11   atorvastatin (LIPITOR) 40 MG tablet Take 1 tablet (40 mg total) by mouth daily at 6 PM. 30 tablet 0   B-D ULTRAFINE III SHORT PEN 31G X 8 MM MISC SMARTSIG:1 Each SUB-Q Daily     ciclesonide (ALVESCO) 80 MCG/ACT inhaler Inhale 2 puffs into the lungs 2 (two) times daily. 1 each 5   Dulaglutide (TRULICITY) 1.5 KX/3.8HW SOPN Inject into the skin. Once a week     EPINEPHrine (EPIPEN 2-PAK) 0.3 mg/0.3 mL IJ SOAJ injection Use as directed for life-threatening allergic reaction. 2 each 1   escitalopram (LEXAPRO) 20 MG tablet Take 20 mg by mouth daily.  ezetimibe (ZETIA) 10 MG tablet Take 1 tablet (10 mg total) by mouth daily.     ferrous sulfate 325 (65 FE) MG tablet Take 365 mg by mouth daily.      folic acid (FOLVITE) 1 MG tablet Take 1 mg by mouth 2 (two) times daily.      hydrALAZINE (APRESOLINE) 25 MG tablet Take 25 mg by mouth 3 (three) times daily.      insulin aspart protamine - aspart (NOVOLOG 70/30 FLEXPEN RELION) (70-30) 100 UNIT/ML FlexPen Inject 0.15-0.2 mLs (15-20 Units total) into the skin in the morning, at noon, and at bedtime. 15 mL 11   insulin glargine, 2 Unit Dial, (TOUJEO MAX SOLOSTAR) 300 UNIT/ML Solostar Pen Inject 46 Units into the skin daily.     irbesartan (AVAPRO) 150 MG  tablet Take 1 tablet (150 mg total) by mouth daily.     ketorolac (ACULAR) 0.4 % SOLN Place 1 drop into the right eye 4 (four) times daily.     latanoprost (XALATAN) 0.005 % ophthalmic solution 1 drop at bedtime.     levocetirizine (XYZAL) 5 MG tablet Take 1 tablet (5 mg total) by mouth every evening.     levothyroxine (SYNTHROID) 125 MCG tablet Take 125 mcg by mouth daily before breakfast. BRAND ONLY     LORazepam (ATIVAN) 0.5 MG tablet Take 0.5 mg by mouth as needed for anxiety.     Methotrexate Sodium (METHOTREXATE, PF,) 50 MG/2ML injection Inject 1 mL (25 mg total) into the muscle once a week. 1 ml once a week 2 mL    metoprolol succinate (TOPROL-XL) 25 MG 24 hr tablet TAKE 1 TABLET(25 MG) BY MOUTH DAILY 90 tablet 3   moxifloxacin (VIGAMOX) 0.5 % ophthalmic solution Place 1 drop into the right eye QID.     Multiple Vitamins-Minerals (CENTRUM SILVER PO) Take 1 tablet by mouth daily.     prednisoLONE acetate (PRED FORTE) 1 % ophthalmic suspension Place 1 drop into the right eye 4 (four) times daily.     sulfaSALAzine (AZULFIDINE) 500 MG tablet Take 1 tablet (500 mg total) by mouth 2 (two) times daily.     vitamin B-12 (CYANOCOBALAMIN) 100 MCG tablet Take 100 mcg by mouth daily.      VITAMIN D, ERGOCALCIFEROL, PO Take 2,000 mg by mouth 2 (two) times daily.     No current facility-administered medications for this visit.     Known medication allergies: Allergies  Allergen Reactions   Ozempic (0.25 Or 0.5 Mg-Dose) [Semaglutide(0.25 Or 0.5mg -Dos)] Other (See Comments)    Cataracts requiring surgery and impairing vision   Jardiance [Empagliflozin] Itching and Swelling   Latex    Tramadol Hcl Other (See Comments)   Invokana [Canagliflozin] Other (See Comments)    Urinary incontinence   Percocet [Oxycodone-Acetaminophen] Nausea And Vomiting   Prilosec [Omeprazole] Rash   Rogaine [Minoxidil] Rash   Victoza [Liraglutide] Itching     Physical examination: Blood pressure 138/64, pulse 80,  temperature 97.9 F (36.6 C), temperature source Temporal, resp. rate 16, height 5\' 4"  (1.626 m), weight 238 lb 3.2 oz (108 kg), SpO2 97 %.  General: Alert, interactive, in no acute distress. HEENT: PERRLA, TMs pearly gray, turbinates non-edematous without discharge, post-pharynx non erythematous. Neck: Supple without lymphadenopathy. Lungs: Clear to auscultation without wheezing, rhonchi or rales. {no increased work of breathing. CV: Normal S1, S2 without murmurs. Abdomen: Nondistended, nontender. Skin: Warm and dry, without lesions or rashes. Extremities:  No clubbing, cyanosis or edema. Neuro:   Grossly intact.  Diagnositics/Labs: None today  Assessment and plan: Mild intermittent asthma Allergic rhinitis Hymenoptera allergy Adverse food reaction      1. Use Alvesco inhaler with spacer device.  Use 2 puffs twice a day during asthma flare or respiratory illnesses.    2.  Continue Albuterol HFA 2 puffs every 4-6 hours if needed for cough, wheezing, shortness of breath or chest tightness.  Monitor frequency of use.   3. Consider nasal saline spray/gel for nasal symptoms  4. OTC antihistamine- change to Allegra 180mg  at this time.  Stop Xyzal 5mg  daily as does not seem effective.   Recommend alternating every  6 months or so to ensure continued efficacy (may take additional daily dose if needed for extra control)  5. Continue immunotherapy monthly and access to [facility] or Epipen.  Will obtain environmental allergy panel to see if you have developed new allergens since being on immunotherapy.  This will also help determine if/when you can discontinue immunotherapy  6. Will obtain fish and shellfish panel.  You have access to epinephrine device as above  Respiratory control goals:  Full participation in all desired activities (may need albuterol before activity) Albuterol use two time or less a week on average (not counting use with activity) Cough interfering with sleep two time or  less a month Oral steroids no more than once a year No hospitalizations  Follow-up in 6 months  I appreciate the opportunity to take part in [place] care. Please do not hesitate to contact me with questions.  Sincerely,   Prudy Feeler, MD Allergy/Immunology [facility] [place]

## 2021-10-08 LAB — ALLERGENS W/TOTAL IGE AREA 2
Alternaria Alternata IgE: <0.1 kU/L
Aspergillus Fumigatus IgE: <0.1 kU/L
Bermuda Grass IgE: <0.1 kU/L
Cat Dander IgE: <0.1 kU/L
Cedar, Mountain IgE: <0.1 kU/L
Cladosporium Herbarum IgE: <0.1 kU/L
Cockroach, German IgE: <0.1 kU/L
Common Silver Birch IgE: <0.1 kU/L
Cottonwood IgE: <0.1 kU/L
D Farinae IgE: 0.37 kU/L — AB
D Pteronyssinus IgE: 0.29 kU/L — AB
Dog Dander IgE: <0.1 kU/L
Elm, American IgE: <0.1 kU/L
IgE (Immunoglobulin E), Serum: 103 IU/mL (ref 6–495)
Johnson Grass IgE: <0.1 kU/L
Maple/Box Elder IgE: <0.1 kU/L
Mouse Urine IgE: <0.1 kU/L
Oak, White IgE: <0.1 kU/L
Pecan, Hickory IgE: <0.1 kU/L
Penicillium Chrysogen IgE: <0.1 kU/L
Pigweed, Rough IgE: <0.1 kU/L
Ragweed, Short IgE: <0.1 kU/L
Sheep Sorrel IgE Qn: <0.1 kU/L
Timothy Grass IgE: <0.1 kU/L
White Mulberry IgE: <0.1 kU/L

## 2021-10-08 LAB — ALLERGEN PROFILE, SHELLFISH
Clam IgE: <0.1 kU/L
F023-IgE Crab: <0.1 kU/L
F080-IgE Lobster: <0.1 kU/L
F290-IgE Oyster: <0.1 kU/L
Scallop IgE: <0.1 kU/L
Shrimp IgE: <0.1 kU/L

## 2021-10-08 LAB — ALLERGEN PROFILE, FOOD-FISH
Allergen Mackerel IgE: <0.1 kU/L
Allergen Salmon IgE: <0.1 kU/L
Allergen Trout IgE: <0.1 kU/L
Allergen Walley Pike IgE: <0.1 kU/L
Codfish IgE: <0.1 kU/L
Halibut IgE: <0.1 kU/L
Tuna: <0.1 kU/L

## 2021-10-09 ENCOUNTER — Inpatient Hospital Stay: Payer: Medicare PPO | Attending: Hematology and Oncology

## 2021-10-09 ENCOUNTER — Other Ambulatory Visit: Payer: Self-pay

## 2021-10-09 ENCOUNTER — Inpatient Hospital Stay: Payer: Medicare PPO

## 2021-10-09 VITALS — BP 146/60 | HR 66 | Temp 98.0°F | Resp 20

## 2021-10-09 DIAGNOSIS — N183 Chronic kidney disease, stage 3 unspecified: Secondary | ICD-10-CM

## 2021-10-09 DIAGNOSIS — D638 Anemia in other chronic diseases classified elsewhere: Secondary | ICD-10-CM

## 2021-10-09 DIAGNOSIS — D631 Anemia in chronic kidney disease: Secondary | ICD-10-CM | POA: Insufficient documentation

## 2021-10-09 DIAGNOSIS — D649 Anemia, unspecified: Secondary | ICD-10-CM

## 2021-10-09 LAB — CBC WITH DIFFERENTIAL (CANCER CENTER ONLY)
Abs Immature Granulocytes: 0.01 10*3/uL (ref 0.00–0.07)
Basophils Absolute: 0.1 10*3/uL (ref 0.0–0.1)
Basophils Relative: 1 %
Eosinophils Absolute: 0.1 10*3/uL (ref 0.0–0.5)
Eosinophils Relative: 2 %
HCT: 33.2 % — ABNORMAL LOW (ref 36.0–46.0)
Hemoglobin: 9.8 g/dL — ABNORMAL LOW (ref 12.0–15.0)
Immature Granulocytes: 0 %
Lymphocytes Relative: 19 %
Lymphs Abs: 1.1 10*3/uL (ref 0.7–4.0)
MCH: 27.7 pg (ref 26.0–34.0)
MCHC: 29.5 g/dL — ABNORMAL LOW (ref 30.0–36.0)
MCV: 93.8 fL (ref 80.0–100.0)
Monocytes Absolute: 0.4 10*3/uL (ref 0.1–1.0)
Monocytes Relative: 6 %
Neutro Abs: 4.3 10*3/uL (ref 1.7–7.7)
Neutrophils Relative %: 72 %
Platelet Count: 205 10*3/uL (ref 150–400)
RBC: 3.54 MIL/uL — ABNORMAL LOW (ref 3.87–5.11)
RDW: 16.1 % — ABNORMAL HIGH (ref 11.5–15.5)
WBC Count: 5.9 10*3/uL (ref 4.0–10.5)
nRBC: 0 % (ref 0.0–0.2)

## 2021-10-09 MED ORDER — EPOETIN ALFA-EPBX 40000 UNIT/ML IJ SOLN
40000.0000 [IU] | Freq: Once | INTRAMUSCULAR | Status: AC
  Administered 2021-10-09: 40000 [IU] via SUBCUTANEOUS
  Filled 2021-10-09: qty 1

## 2021-10-10 DIAGNOSIS — E113512 Type 2 diabetes mellitus with proliferative diabetic retinopathy with macular edema, left eye: Secondary | ICD-10-CM | POA: Diagnosis not present

## 2021-10-12 DIAGNOSIS — H25012 Cortical age-related cataract, left eye: Secondary | ICD-10-CM | POA: Diagnosis not present

## 2021-10-12 DIAGNOSIS — H2512 Age-related nuclear cataract, left eye: Secondary | ICD-10-CM | POA: Diagnosis not present

## 2021-10-12 DIAGNOSIS — H25812 Combined forms of age-related cataract, left eye: Secondary | ICD-10-CM | POA: Diagnosis not present

## 2021-10-12 DIAGNOSIS — H25042 Posterior subcapsular polar age-related cataract, left eye: Secondary | ICD-10-CM | POA: Diagnosis not present

## 2021-10-17 ENCOUNTER — Ambulatory Visit (INDEPENDENT_AMBULATORY_CARE_PROVIDER_SITE_OTHER): Payer: Medicare PPO | Admitting: *Deleted

## 2021-10-17 DIAGNOSIS — J309 Allergic rhinitis, unspecified: Secondary | ICD-10-CM

## 2021-10-18 DIAGNOSIS — M0579 Rheumatoid arthritis with rheumatoid factor of multiple sites without organ or systems involvement: Secondary | ICD-10-CM | POA: Diagnosis not present

## 2021-10-24 ENCOUNTER — Inpatient Hospital Stay: Payer: Medicare PPO

## 2021-10-24 ENCOUNTER — Other Ambulatory Visit: Payer: Self-pay

## 2021-10-24 VITALS — BP 156/68 | HR 70 | Temp 98.3°F | Resp 20

## 2021-10-24 DIAGNOSIS — N183 Chronic kidney disease, stage 3 unspecified: Secondary | ICD-10-CM

## 2021-10-24 DIAGNOSIS — D631 Anemia in chronic kidney disease: Secondary | ICD-10-CM | POA: Diagnosis not present

## 2021-10-24 DIAGNOSIS — D638 Anemia in other chronic diseases classified elsewhere: Secondary | ICD-10-CM

## 2021-10-24 DIAGNOSIS — D649 Anemia, unspecified: Secondary | ICD-10-CM

## 2021-10-24 LAB — CBC WITH DIFFERENTIAL (CANCER CENTER ONLY)
Abs Immature Granulocytes: 0.02 K/uL (ref 0.00–0.07)
Basophils Absolute: 0.1 K/uL (ref 0.0–0.1)
Basophils Relative: 1 %
Eosinophils Absolute: 0.2 K/uL (ref 0.0–0.5)
Eosinophils Relative: 2 %
HCT: 33.2 % — ABNORMAL LOW (ref 36.0–46.0)
Hemoglobin: 9.8 g/dL — ABNORMAL LOW (ref 12.0–15.0)
Immature Granulocytes: 0 %
Lymphocytes Relative: 19 %
Lymphs Abs: 1.3 K/uL (ref 0.7–4.0)
MCH: 27.5 pg (ref 26.0–34.0)
MCHC: 29.5 g/dL — ABNORMAL LOW (ref 30.0–36.0)
MCV: 93 fL (ref 80.0–100.0)
Monocytes Absolute: 0.4 K/uL (ref 0.1–1.0)
Monocytes Relative: 5 %
Neutro Abs: 5.1 K/uL (ref 1.7–7.7)
Neutrophils Relative %: 73 %
Platelet Count: 215 K/uL (ref 150–400)
RBC: 3.57 MIL/uL — ABNORMAL LOW (ref 3.87–5.11)
RDW: 17 % — ABNORMAL HIGH (ref 11.5–15.5)
WBC Count: 7 K/uL (ref 4.0–10.5)
nRBC: 0 % (ref 0.0–0.2)

## 2021-10-24 MED ORDER — EPOETIN ALFA-EPBX 40000 UNIT/ML IJ SOLN
40000.0000 [IU] | Freq: Once | INTRAMUSCULAR | Status: AC
  Administered 2021-10-24: 10:00:00 40000 [IU] via SUBCUTANEOUS
  Filled 2021-10-24: qty 1

## 2021-10-24 NOTE — Patient Instructions (Signed)
Epoetin Alfa injection °What is this medication? °EPOETIN ALFA (e POE e tin AL fa) helps your body make more red blood cells. This medicine is used to treat anemia caused by chronic kidney disease, cancer chemotherapy, or HIV-therapy. It may also be used before surgery if you have anemia. °This medicine may be used for other purposes; ask your health care provider or pharmacist if you have questions. °COMMON BRAND NAME(S): Epogen, Procrit, Retacrit °What should I tell my care team before I take this medication? °They need to know if you have any of these conditions: °cancer °heart disease °high blood pressure °history of blood clots °history of stroke °low levels of folate, iron, or vitamin B12 in the blood °seizures °an unusual or allergic reaction to erythropoietin, albumin, benzyl alcohol, hamster proteins, other medicines, foods, dyes, or preservatives °pregnant or trying to get pregnant °breast-feeding °How should I use this medication? °This medicine is for injection into a vein or under the skin. It is usually given by a health care professional in a hospital or clinic setting. °If you get this medicine at home, you will be taught how to prepare and give this medicine. Use exactly as directed. Take your medicine at regular intervals. Do not take your medicine more often than directed. °It is important that you put your used needles and syringes in a special sharps container. Do not put them in a trash can. If you do not have a sharps container, call your pharmacist or healthcare provider to get one. °A special MedGuide will be given to you by the pharmacist with each prescription and refill. Be sure to read this information carefully each time. °Talk to your pediatrician regarding the use of this medicine in children. While this drug may be prescribed for selected conditions, precautions do apply. °Overdosage: If you think you have taken too much of this medicine contact a poison control center or emergency  room at once. °NOTE: This medicine is only for you. Do not share this medicine with others. °What if I miss a dose? °If you miss a dose, take it as soon as you can. If it is almost time for your next dose, take only that dose. Do not take double or extra doses. °What may interact with this medication? °Interactions have not been studied. °This list may not describe all possible interactions. Give your health care provider a list of all the medicines, herbs, non-prescription drugs, or dietary supplements you use. Also tell them if you smoke, drink alcohol, or use illegal drugs. Some items may interact with your medicine. °What should I watch for while using this medication? °Your condition will be monitored carefully while you are receiving this medicine. °You may need blood work done while you are taking this medicine. °This medicine may cause a decrease in vitamin B6. You should make sure that you get enough vitamin B6 while you are taking this medicine. Discuss the foods you eat and the vitamins you take with your health care professional. °What side effects may I notice from receiving this medication? °Side effects that you should report to your doctor or health care professional as soon as possible: °allergic reactions like skin rash, itching or hives, swelling of the face, lips, or tongue °seizures °signs and symptoms of a blood clot such as breathing problems; changes in vision; chest pain; severe, sudden headache; pain, swelling, warmth in the leg; trouble speaking; sudden numbness or weakness of the face, arm or leg °signs and symptoms of a stroke like   changes in vision; confusion; trouble speaking or understanding; severe headaches; sudden numbness or weakness of the face, arm or leg; trouble walking; dizziness; loss of balance or coordination °Side effects that usually do not require medical attention (report to your doctor or health care professional if they continue or are  bothersome): °chills °cough °dizziness °fever °headaches °joint pain °muscle cramps °muscle pain °nausea, vomiting °pain, redness, or irritation at site where injected °This list may not describe all possible side effects. Call your doctor for medical advice about side effects. You may report side effects to FDA at 1-800-FDA-1088. °Where should I keep my medication? °Keep out of the reach of children. °Store in a refrigerator between 2 and 8 degrees C (36 and 46 degrees F). Do not freeze or shake. Throw away any unused portion if using a single-dose vial. Multi-dose vials can be kept in the refrigerator for up to 21 days after the initial dose. Throw away unused medicine. °NOTE: This sheet is a summary. It may not cover all possible information. If you have questions about this medicine, talk to your doctor, pharmacist, or health care provider. °© 2022 [employer]vier/Gold Standard (2017-06-18 00:00:00) ° °

## 2021-11-01 DIAGNOSIS — M1990 Unspecified osteoarthritis, unspecified site: Secondary | ICD-10-CM | POA: Diagnosis not present

## 2021-11-01 DIAGNOSIS — M79643 Pain in unspecified hand: Secondary | ICD-10-CM | POA: Diagnosis not present

## 2021-11-01 DIAGNOSIS — D649 Anemia, unspecified: Secondary | ICD-10-CM | POA: Diagnosis not present

## 2021-11-01 DIAGNOSIS — M1711 Unilateral primary osteoarthritis, right knee: Secondary | ICD-10-CM | POA: Diagnosis not present

## 2021-11-01 DIAGNOSIS — M25569 Pain in unspecified knee: Secondary | ICD-10-CM | POA: Diagnosis not present

## 2021-11-01 DIAGNOSIS — M797 Fibromyalgia: Secondary | ICD-10-CM | POA: Diagnosis not present

## 2021-11-01 DIAGNOSIS — Z79899 Other long term (current) drug therapy: Secondary | ICD-10-CM | POA: Diagnosis not present

## 2021-11-01 DIAGNOSIS — M0579 Rheumatoid arthritis with rheumatoid factor of multiple sites without organ or systems involvement: Secondary | ICD-10-CM | POA: Diagnosis not present

## 2021-11-07 ENCOUNTER — Inpatient Hospital Stay: Payer: Medicare PPO

## 2021-11-07 ENCOUNTER — Other Ambulatory Visit: Payer: Self-pay

## 2021-11-07 ENCOUNTER — Inpatient Hospital Stay: Payer: Medicare PPO | Attending: Hematology and Oncology

## 2021-11-07 DIAGNOSIS — E279 Disorder of adrenal gland, unspecified: Secondary | ICD-10-CM | POA: Diagnosis not present

## 2021-11-07 DIAGNOSIS — D631 Anemia in chronic kidney disease: Secondary | ICD-10-CM | POA: Insufficient documentation

## 2021-11-07 DIAGNOSIS — Z79899 Other long term (current) drug therapy: Secondary | ICD-10-CM | POA: Diagnosis not present

## 2021-11-07 DIAGNOSIS — N183 Chronic kidney disease, stage 3 unspecified: Secondary | ICD-10-CM | POA: Insufficient documentation

## 2021-11-07 LAB — CBC WITH DIFFERENTIAL (CANCER CENTER ONLY)
Abs Immature Granulocytes: 0.01 10*3/uL (ref 0.00–0.07)
Basophils Absolute: 0.1 10*3/uL (ref 0.0–0.1)
Basophils Relative: 1 %
Eosinophils Absolute: 0.1 10*3/uL (ref 0.0–0.5)
Eosinophils Relative: 2 %
HCT: 37.8 % (ref 36.0–46.0)
Hemoglobin: 11.1 g/dL — ABNORMAL LOW (ref 12.0–15.0)
Immature Granulocytes: 0 %
Lymphocytes Relative: 19 %
Lymphs Abs: 1.1 10*3/uL (ref 0.7–4.0)
MCH: 27 pg (ref 26.0–34.0)
MCHC: 29.4 g/dL — ABNORMAL LOW (ref 30.0–36.0)
MCV: 92 fL (ref 80.0–100.0)
Monocytes Absolute: 0.4 10*3/uL (ref 0.1–1.0)
Monocytes Relative: 7 %
Neutro Abs: 4.3 10*3/uL (ref 1.7–7.7)
Neutrophils Relative %: 71 %
Platelet Count: 231 10*3/uL (ref 150–400)
RBC: 4.11 MIL/uL (ref 3.87–5.11)
RDW: 17.1 % — ABNORMAL HIGH (ref 11.5–15.5)
WBC Count: 6 10*3/uL (ref 4.0–10.5)
nRBC: 0 % (ref 0.0–0.2)

## 2021-11-07 NOTE — Progress Notes (Signed)
Patient came in for Retacrit injection today.  Hemoglobin 11.1, therefore she does not meet the parameters for the injection today. Patient verbalized understanding.

## 2021-11-07 NOTE — Patient Instructions (Signed)
Epoetin Alfa injection °What is this medication? °EPOETIN ALFA (e POE e tin AL fa) helps your body make more red blood cells. This medicine is used to treat anemia caused by chronic kidney disease, cancer chemotherapy, or HIV-therapy. It may also be used before surgery if you have anemia. °This medicine may be used for other purposes; ask your health care provider or pharmacist if you have questions. °COMMON BRAND NAME(S): Epogen, Procrit, Retacrit °What should I tell my care team before I take this medication? °They need to know if you have any of these conditions: °cancer °heart disease °high blood pressure °history of blood clots °history of stroke °low levels of folate, iron, or vitamin B12 in the blood °seizures °an unusual or allergic reaction to erythropoietin, albumin, benzyl alcohol, hamster proteins, other medicines, foods, dyes, or preservatives °pregnant or trying to get pregnant °breast-feeding °How should I use this medication? °This medicine is for injection into a vein or under the skin. It is usually given by a health care professional in a hospital or clinic setting. °If you get this medicine at home, you will be taught how to prepare and give this medicine. Use exactly as directed. Take your medicine at regular intervals. Do not take your medicine more often than directed. °It is important that you put your used needles and syringes in a special sharps container. Do not put them in a trash can. If you do not have a sharps container, call your pharmacist or healthcare provider to get one. °A special MedGuide will be given to you by the pharmacist with each prescription and refill. Be sure to read this information carefully each time. °Talk to your pediatrician regarding the use of this medicine in children. While this drug may be prescribed for selected conditions, precautions do apply. °Overdosage: If you think you have taken too much of this medicine contact a poison control center or emergency  room at once. °NOTE: This medicine is only for you. Do not share this medicine with others. °What if I miss a dose? °If you miss a dose, take it as soon as you can. If it is almost time for your next dose, take only that dose. Do not take double or extra doses. °What may interact with this medication? °Interactions have not been studied. °This list may not describe all possible interactions. Give your health care provider a list of all the medicines, herbs, non-prescription drugs, or dietary supplements you use. Also tell them if you smoke, drink alcohol, or use illegal drugs. Some items may interact with your medicine. °What should I watch for while using this medication? °Your condition will be monitored carefully while you are receiving this medicine. °You may need blood work done while you are taking this medicine. °This medicine may cause a decrease in vitamin B6. You should make sure that you get enough vitamin B6 while you are taking this medicine. Discuss the foods you eat and the vitamins you take with your health care professional. °What side effects may I notice from receiving this medication? °Side effects that you should report to your doctor or health care professional as soon as possible: °allergic reactions like skin rash, itching or hives, swelling of the face, lips, or tongue °seizures °signs and symptoms of a blood clot such as breathing problems; changes in vision; chest pain; severe, sudden headache; pain, swelling, warmth in the leg; trouble speaking; sudden numbness or weakness of the face, arm or leg °signs and symptoms of a stroke like   changes in vision; confusion; trouble speaking or understanding; severe headaches; sudden numbness or weakness of the face, arm or leg; trouble walking; dizziness; loss of balance or coordination °Side effects that usually do not require medical attention (report to your doctor or health care professional if they continue or are  bothersome): °chills °cough °dizziness °fever °headaches °joint pain °muscle cramps °muscle pain °nausea, vomiting °pain, redness, or irritation at site where injected °This list may not describe all possible side effects. Call your doctor for medical advice about side effects. You may report side effects to FDA at 1-800-FDA-1088. °Where should I keep my medication? °Keep out of the reach of children. °Store in a refrigerator between 2 and 8 degrees C (36 and 46 degrees F). Do not freeze or shake. Throw away any unused portion if using a single-dose vial. Multi-dose vials can be kept in the refrigerator for up to 21 days after the initial dose. Throw away unused medicine. °NOTE: This sheet is a summary. It may not cover all possible information. If you have questions about this medicine, talk to your doctor, pharmacist, or health care provider. °© 2022 [employer]vier/Gold Standard (2017-06-18 00:00:00) ° °

## 2021-11-09 ENCOUNTER — Other Ambulatory Visit: Payer: Self-pay

## 2021-11-09 ENCOUNTER — Ambulatory Visit: Payer: Medicare PPO | Admitting: Podiatry

## 2021-11-09 DIAGNOSIS — Z794 Long term (current) use of insulin: Secondary | ICD-10-CM | POA: Diagnosis not present

## 2021-11-09 DIAGNOSIS — E0800 Diabetes mellitus due to underlying condition with hyperosmolarity without nonketotic hyperglycemic-hyperosmolar coma (NKHHC): Secondary | ICD-10-CM

## 2021-11-09 DIAGNOSIS — M79674 Pain in right toe(s): Secondary | ICD-10-CM | POA: Diagnosis not present

## 2021-11-09 DIAGNOSIS — M79675 Pain in left toe(s): Secondary | ICD-10-CM

## 2021-11-09 DIAGNOSIS — B351 Tinea unguium: Secondary | ICD-10-CM

## 2021-11-09 MED ORDER — CICLOPIROX 8 % EX SOLN: Freq: Every day | CUTANEOUS | 0 refills | Status: DC

## 2021-11-09 NOTE — Patient Instructions (Signed)
Look for urea 40% cream or ointment and apply to the thickened dry skin / calluses. This can be bought over the counter, at a pharmacy or online such as [employer].  

## 2021-11-12 NOTE — Progress Notes (Signed)
°  Subjective:  Patient ID: Jocelyn Kaufman, female    DOB: 1956/01/09,  MRN: [id number]  Chief Complaint  Patient presents with   Diabetes    Diabetic foot exam  Right hallux nail is split     66 y.o. female presents with the above complaint. History confirmed with patient.  She also has discoloring of the nails bilateral  Objective:  Physical Exam: warm, good capillary refill, no trophic changes or ulcerative lesions, normal DP and PT pulses, normal monofilament exam, normal sensory exam, and onychomycosis. Assessment:   1. Pain due to onychomycosis of toenails of both feet   2. 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  Patient educated on diabetes. Discussed proper diabetic foot care and discussed risks and complications of disease. Educated patient in depth on reasons to return to the office immediately should he/she discover anything concerning or new on the feet. All questions answered. Discussed proper shoes as well.   Discussed the etiology and treatment options for the condition in detail with the patient. Educated patient on the topical and oral treatment options for mycotic nails. Recommended debridement of the nails today. Sharp and mechanical debridement performed of all painful and mycotic nails today. Nails debrided in length and thickness using a nail nipper to level of comfort. Discussed treatment options including appropriate shoe gear. Follow up as needed for painful nails.  She is interested in treating the nail fungus.  We discussed oral and topical treatment.  She would prefer to proceed with topical treatment and we will try ciclopirox and I sent in Rx for this and use was reviewed.  Return in about 3 months (around 02/07/2022) for at risk diabetic foot care.

## 2021-11-15 DIAGNOSIS — M0579 Rheumatoid arthritis with rheumatoid factor of multiple sites without organ or systems involvement: Secondary | ICD-10-CM | POA: Diagnosis not present

## 2021-11-16 DIAGNOSIS — N182 Chronic kidney disease, stage 2 (mild): Secondary | ICD-10-CM | POA: Diagnosis not present

## 2021-11-20 NOTE — Progress Notes (Signed)
Error

## 2021-11-21 ENCOUNTER — Ambulatory Visit: Payer: Medicare PPO

## 2021-11-21 ENCOUNTER — Inpatient Hospital Stay: Payer: Medicare PPO

## 2021-11-21 ENCOUNTER — Other Ambulatory Visit: Payer: Self-pay

## 2021-11-21 ENCOUNTER — Other Ambulatory Visit: Payer: Medicare PPO

## 2021-11-21 ENCOUNTER — Inpatient Hospital Stay: Payer: Medicare PPO | Admitting: Hematology and Oncology

## 2021-11-21 DIAGNOSIS — D631 Anemia in chronic kidney disease: Secondary | ICD-10-CM

## 2021-11-21 DIAGNOSIS — E279 Disorder of adrenal gland, unspecified: Secondary | ICD-10-CM | POA: Diagnosis not present

## 2021-11-21 DIAGNOSIS — Z79899 Other long term (current) drug therapy: Secondary | ICD-10-CM | POA: Diagnosis not present

## 2021-11-21 DIAGNOSIS — N183 Chronic kidney disease, stage 3 unspecified: Secondary | ICD-10-CM

## 2021-11-21 LAB — CBC WITH DIFFERENTIAL (CANCER CENTER ONLY)
Abs Immature Granulocytes: 0.01 10*3/uL (ref 0.00–0.07)
Basophils Absolute: 0 10*3/uL (ref 0.0–0.1)
Basophils Relative: 1 %
Eosinophils Absolute: 0.2 10*3/uL (ref 0.0–0.5)
Eosinophils Relative: 3 %
HCT: 35.4 % — ABNORMAL LOW (ref 36.0–46.0)
Hemoglobin: 10.5 g/dL — ABNORMAL LOW (ref 12.0–15.0)
Immature Granulocytes: 0 %
Lymphocytes Relative: 22 %
Lymphs Abs: 1.2 10*3/uL (ref 0.7–4.0)
MCH: 26.7 pg (ref 26.0–34.0)
MCHC: 29.7 g/dL — ABNORMAL LOW (ref 30.0–36.0)
MCV: 90.1 fL (ref 80.0–100.0)
Monocytes Absolute: 0.5 10*3/uL (ref 0.1–1.0)
Monocytes Relative: 9 %
Neutro Abs: 3.8 10*3/uL (ref 1.7–7.7)
Neutrophils Relative %: 65 %
Platelet Count: 166 10*3/uL (ref 150–400)
RBC: 3.93 MIL/uL (ref 3.87–5.11)
RDW: 16.3 % — ABNORMAL HIGH (ref 11.5–15.5)
WBC Count: 5.7 10*3/uL (ref 4.0–10.5)
nRBC: 0 % (ref 0.0–0.2)

## 2021-11-21 MED ORDER — SULFASALAZINE 500 MG PO TABS: 1500.0000 mg | ORAL_TABLET | Freq: Two times a day (BID) | ORAL | Status: AC

## 2021-11-21 MED ORDER — FEXOFENADINE HCL 180 MG PO TABS: 180.0000 mg | ORAL_TABLET | Freq: Every day | ORAL | Status: AC

## 2021-11-21 MED ORDER — BETIMOL 0.25 % OP SOLN: 1.0000 [drp] | Freq: Two times a day (BID) | OPHTHALMIC | 12 refills | Status: DC

## 2021-11-21 MED ORDER — SIMBRINZA 1-0.2 % OP SUSP: 1.0000 [drp] | Freq: Three times a day (TID) | OPHTHALMIC | Status: DC

## 2021-11-21 NOTE — Progress Notes (Signed)
Patient Care Team: Deland Pretty, MD as PCP - General (Internal Medicine) Debara Pickett Nadean Corwin, MD as PCP - Cardiology (Cardiology) Nicholas Lose, MD as Consulting Physician (Hematology and Oncology) Lahoma Rocker, MD as Consulting Physician (Rheumatology) Madelin Rear, MD as Consulting Physician (Endocrinology) Marygrace Drought, MD as Consulting Physician (Ophthalmology) Claudia Desanctis, MD as Consulting Physician (Nephrology)  DIAGNOSIS:    ICD-10-CM   1. Anemia in stage 3 chronic kidney disease, unspecified whether stage 3a or 3b CKD (HCC)  N18.30    D63.1       CHIEF COMPLIANT: Follow-up of anemia of chronic kidney disease on Aranesp injections  INTERVAL HISTORY: Jocelyn Kaufman is a 66 y.o. with above-mentioned history of anemia of chronic kidney disease who is currently receiving Aranesp injections. She presents to the clinic today for follow-up.  Overall she continues to have decreased energy levels.  2 weeks ago she did not get Aranesp injection because hemoglobin is 11.1.  today she does not get Aranesp because of hemoglobin of 10.5.  She has trouble with vision which she attributes to Ozempic treatment  ALLERGIES:  is allergic to ozempic (0.25 or 0.5 mg-dose) [semaglutide(0.25 or 0.5mg -dos)], jardiance [empagliflozin], latex, tramadol hcl, invokana [canagliflozin], percocet [oxycodone-acetaminophen], prilosec [omeprazole], rogaine [minoxidil], and victoza [liraglutide].  MEDICATIONS:  Current Outpatient Medications  Medication Sig Dispense Refill   Abatacept (ORENCIA IV) Inject 1 Package into the vein every 30 (thirty) days. (unsure of dosage)     Aflibercept (EYLEA) 2 MG/0.05ML SOLN 2 mg by Intravitreal route every 3 (three) months.     albuterol (VENTOLIN HFA) 108 (90 Base) MCG/ACT inhaler Inhale two puffs every four to six hours as needed for cough or wheeze. 18 g 2   amLODipine (NORVASC) 5 MG tablet Take 2.5 mg by mouth daily.      aspirin EC 81 MG tablet Take 1 tablet (81  mg total) by mouth daily. Swallow whole. 30 tablet 11   atorvastatin (LIPITOR) 40 MG tablet Take 1 tablet (40 mg total) by mouth daily at 6 PM. 30 tablet 0   B-D ULTRAFINE III SHORT PEN 31G X 8 MM MISC SMARTSIG:1 Each SUB-Q Daily     ciclesonide (ALVESCO) 80 MCG/ACT inhaler Inhale 2 puffs into the lungs 2 (two) times daily. 1 each 5   ciclopirox (PENLAC) 8 % solution Apply topically at bedtime. Apply over nail and surrounding skin. Apply daily over previous coat. After seven (7) days, may remove with alcohol and continue cycle. 6.6 mL 0   Dulaglutide (TRULICITY) 1.5 OF/7.5ZW SOPN Inject into the skin. Once a week     EPINEPHrine (EPIPEN 2-PAK) 0.3 mg/0.3 mL IJ SOAJ injection Use as directed for life-threatening allergic reaction. 2 each 1   escitalopram (LEXAPRO) 20 MG tablet Take 20 mg by mouth daily.     ezetimibe (ZETIA) 10 MG tablet Take 1 tablet (10 mg total) by mouth daily.     ferrous sulfate 325 (65 FE) MG tablet Take 365 mg by mouth daily.      folic acid (FOLVITE) 1 MG tablet Take 1 mg by mouth 2 (two) times daily.      hydrALAZINE (APRESOLINE) 25 MG tablet Take 25 mg by mouth 3 (three) times daily.      insulin aspart protamine - aspart (NOVOLOG 70/30 FLEXPEN RELION) (70-30) 100 UNIT/ML FlexPen Inject 0.15-0.2 mLs (15-20 Units total) into the skin in the morning, at noon, and at bedtime. 15 mL 11   insulin glargine, 2 Unit Dial, (TOUJEO MAX SOLOSTAR)  300 UNIT/ML Solostar Pen Inject 46 Units into the skin daily.     irbesartan (AVAPRO) 150 MG tablet Take 1 tablet (150 mg total) by mouth daily.     ketorolac (ACULAR) 0.4 % SOLN Place 1 drop into the right eye 4 (four) times daily.     latanoprost (XALATAN) 0.005 % ophthalmic solution 1 drop at bedtime.     levocetirizine (XYZAL) 5 MG tablet Take 1 tablet (5 mg total) by mouth every evening.     levothyroxine (SYNTHROID) 125 MCG tablet Take 125 mcg by mouth daily before breakfast. BRAND ONLY     LORazepam (ATIVAN) 0.5 MG tablet Take 0.5 mg  by mouth as needed for anxiety.     Methotrexate Sodium (METHOTREXATE, PF,) 50 MG/2ML injection Inject 1 mL (25 mg total) into the muscle once a week. 1 ml once a week 2 mL    metoprolol succinate (TOPROL-XL) 25 MG 24 hr tablet TAKE 1 TABLET(25 MG) BY MOUTH DAILY 90 tablet 3   moxifloxacin (VIGAMOX) 0.5 % ophthalmic solution Place 1 drop into the right eye QID.     Multiple Vitamins-Minerals (CENTRUM SILVER PO) Take 1 tablet by mouth daily.     prednisoLONE acetate (PRED FORTE) 1 % ophthalmic suspension Place 1 drop into the right eye 4 (four) times daily.     sulfaSALAzine (AZULFIDINE) 500 MG tablet Take 1 tablet (500 mg total) by mouth 2 (two) times daily.     vitamin B-12 (CYANOCOBALAMIN) 100 MCG tablet Take 100 mcg by mouth daily.      VITAMIN D, ERGOCALCIFEROL, PO Take 2,000 mg by mouth 2 (two) times daily.     No current facility-administered medications for this visit.    PHYSICAL EXAMINATION: ECOG PERFORMANCE STATUS: 1 - Symptomatic but completely ambulatory  Vitals:   11/21/21 1006  BP: (!) 123/59  Pulse: 76  Resp: 18  Temp: 97.8 F (36.6 C)  SpO2: 98%   Filed Weights   11/21/21 1006  Weight: 239 lb 6.4 oz (108.6 kg)    LABORATORY DATA:  I have reviewed the data as listed CMP Latest Ref Rng & Units 10/07/2018 07/12/2018 07/11/2018  Glucose 65 - 99 mg/dL 123(H) 128(H) 143(H)  BUN 8 - 27 mg/dL 26 47(H) 43(H)  Creatinine 0.57 - 1.00 mg/dL 1.13(H) 1.35(H) 1.60(H)  Sodium 134 - 144 mmol/L 140 136 136  Potassium 3.5 - 5.2 mmol/L 5.1 4.6 4.3  Chloride 96 - 106 mmol/L 105 103 103  CO2 20 - 29 mmol/L 21 23 22   Calcium 8.7 - 10.3 mg/dL 9.4 8.7(L) 8.8(L)  Total Protein 6.5 - 8.1 g/dL - - -  Total Bilirubin 0.3 - 1.2 mg/dL - - -  Alkaline Phos 38 - 126 U/L - - -  AST 15 - 41 U/L - - -  ALT 0 - 44 U/L - - -    Lab Results  Component Value Date   WBC 5.7 11/21/2021   HGB 10.5 (L) 11/21/2021   HCT 35.4 (L) 11/21/2021   MCV 90.1 11/21/2021   PLT 166 11/21/2021    NEUTROABS 3.8 11/21/2021    ASSESSMENT & PLAN:  Anemia in chronic kidney disease Patient has history of hypothyroidism as well as possible adrenal dysfunction issues for which she goes to [facility]   Previous blood work review: 1.  Erythropoietin: 24.2 2. SPEP: No M protein 3.  Haptoglobin: 146, LDH 281, absolute reticulocyte count 96.4, direct Coombs test negative 4.  B12 750 5.  Folic acid 81.1 6.  Ferritin 88, iron saturation 25 7. 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 Lab review 03/23/2020: Erythropoietin 27.9, hemoglobin 9.6 02/02/2021: Hemoglobin 10.6 (given her severe symptoms continue with Aranesp) 06/22/2021: Hemoglobin 11.3  11/07/2021: Hemoglobin 11.1 11/21/2021: Hemoglobin 10.5 (no Aranesp today)   Return to clinic in 2 weeks for lab check and Aranesp and after that we can see if she can go to once a month for Aranesp injections   I will see her back in March for labs and follow-up.    No orders of the defined types were placed in this encounter.  The patient has a good understanding of the overall plan. she agrees with it. she will call with any problems that may develop before the next visit here.  Total time spent: 20 mins including face to face time and time spent for planning, charting and coordination of care  Rulon Eisenmenger, MD, MPH 11/21/2021  I, Thana Ates, am acting as scribe for Dr. Nicholas Lose.  I have reviewed the above documentation for accuracy and completeness, and I agree with the above.

## 2021-11-21 NOTE — Assessment & Plan Note (Signed)
Patient has history of hypothyroidism as well as possible adrenal dysfunction issues for which she goes to [facility]  Previous blood work review: 1.Erythropoietin: 24.2 2. SPEP:No M protein 3.Haptoglobin: 146, LDH 281, absolute reticulocyte count 96.4, direct Coombs test negative 4.B12 750 5.Folic acid 84.0 6.Ferritin 88, iron saturation 25 7.Hemoglobin 9.6 with MCV 89 ----------------------------------------------------------------------------------------------------------------------------------------------- Etiology: Anemia of chronic disease secondary tochronic kidney disease,hypothyroidism and adrenal issues  Current treatment:Aranesp300 mcg started7/06/2020. Lab review 03/23/2020: Erythropoietin 27.9, hemoglobin 9.6 02/02/2021: Hemoglobin 10.6 (given her severe symptomscontinue with Aranesp) 06/22/2021: Hemoglobin 11.3  11/07/2021: Hemoglobin 11 point  Return to clinic every other month for labs and Aranesp injections.  I will see her back in 4 months for labs and follow-up.

## 2021-11-23 DIAGNOSIS — E113491 Type 2 diabetes mellitus with severe nonproliferative diabetic retinopathy without macular edema, right eye: Secondary | ICD-10-CM | POA: Diagnosis not present

## 2021-12-01 ENCOUNTER — Encounter: Payer: Self-pay | Admitting: Hematology and Oncology

## 2021-12-01 ENCOUNTER — Ambulatory Visit: Payer: Medicare PPO | Attending: Internal Medicine

## 2021-12-01 ENCOUNTER — Other Ambulatory Visit (HOSPITAL_BASED_OUTPATIENT_CLINIC_OR_DEPARTMENT_OTHER): Payer: Self-pay

## 2021-12-01 ENCOUNTER — Other Ambulatory Visit: Payer: Self-pay

## 2021-12-01 DIAGNOSIS — Z23 Encounter for immunization: Secondary | ICD-10-CM

## 2021-12-01 MED ORDER — PFIZER COVID-19 VAC BIVALENT 30 MCG/0.3ML IM SUSP
INTRAMUSCULAR | 0 refills | Status: DC
  Filled 2021-12-01: qty 0.3, 1d supply, fill #0

## 2021-12-01 NOTE — Progress Notes (Signed)
° °  Covid-19 Vaccination Clinic  Name:  Jocelyn Kaufman    MRN: [id number] DOB: 1955/11/30  12/01/2021  [name] was observed post Covid-19 immunization for 15 minutes without incident. She was provided with Vaccine Information Sheet and instruction to access the V-Safe system.   [name] was instructed to call 911 with any severe reactions post vaccine: Difficulty breathing  Swelling of face and throat  A fast heartbeat  A bad rash all over body  Dizziness and weakness   Immunizations Administered     Name Date Dose VIS Date Route   Pfizer Covid-19 Vaccine Bivalent Booster 12/01/2021  2:30 PM 0.3 mL 06/28/2021 Intramuscular   Manufacturer: [place]   Lot: [id number]   [place]: [id number]

## 2021-12-05 ENCOUNTER — Other Ambulatory Visit: Payer: Self-pay

## 2021-12-05 ENCOUNTER — Inpatient Hospital Stay: Payer: Medicare PPO

## 2021-12-05 ENCOUNTER — Inpatient Hospital Stay: Payer: Medicare PPO | Attending: Hematology and Oncology

## 2021-12-05 VITALS — BP 157/70 | HR 75 | Temp 98.7°F | Resp 18

## 2021-12-05 DIAGNOSIS — D631 Anemia in chronic kidney disease: Secondary | ICD-10-CM | POA: Diagnosis not present

## 2021-12-05 DIAGNOSIS — N183 Chronic kidney disease, stage 3 unspecified: Secondary | ICD-10-CM | POA: Diagnosis not present

## 2021-12-05 DIAGNOSIS — D649 Anemia, unspecified: Secondary | ICD-10-CM

## 2021-12-05 DIAGNOSIS — D638 Anemia in other chronic diseases classified elsewhere: Secondary | ICD-10-CM

## 2021-12-05 LAB — CBC WITH DIFFERENTIAL (CANCER CENTER ONLY)
Abs Immature Granulocytes: 0.03 10*3/uL (ref 0.00–0.07)
Basophils Absolute: 0 10*3/uL (ref 0.0–0.1)
Basophils Relative: 1 %
Eosinophils Absolute: 0.2 10*3/uL (ref 0.0–0.5)
Eosinophils Relative: 2 %
HCT: 34.1 % — ABNORMAL LOW (ref 36.0–46.0)
Hemoglobin: 10 g/dL — ABNORMAL LOW (ref 12.0–15.0)
Immature Granulocytes: 0 %
Lymphocytes Relative: 14 %
Lymphs Abs: 1 10*3/uL (ref 0.7–4.0)
MCH: 26.5 pg (ref 26.0–34.0)
MCHC: 29.3 g/dL — ABNORMAL LOW (ref 30.0–36.0)
MCV: 90.2 fL (ref 80.0–100.0)
Monocytes Absolute: 0.5 10*3/uL (ref 0.1–1.0)
Monocytes Relative: 8 %
Neutro Abs: 5.2 10*3/uL (ref 1.7–7.7)
Neutrophils Relative %: 75 %
Platelet Count: 196 10*3/uL (ref 150–400)
RBC: 3.78 MIL/uL — ABNORMAL LOW (ref 3.87–5.11)
RDW: 16.8 % — ABNORMAL HIGH (ref 11.5–15.5)
WBC Count: 6.9 10*3/uL (ref 4.0–10.5)
nRBC: 0 % (ref 0.0–0.2)

## 2021-12-05 MED ORDER — EPOETIN ALFA-EPBX 40000 UNIT/ML IJ SOLN
40000.0000 [IU] | Freq: Once | INTRAMUSCULAR | Status: AC
  Administered 2021-12-05: 40000 [IU] via SUBCUTANEOUS
  Filled 2021-12-05: qty 1

## 2021-12-08 ENCOUNTER — Encounter: Payer: Self-pay | Admitting: *Deleted

## 2021-12-08 NOTE — Progress Notes (Signed)
Per MD based on pt recent lab results, pt needing to receive Retacrit q2 weeks.  Last injection 12/05/21.  Message sent to scheduling to schedule pt the week of 12/19/21.  RN attempt x1 to contact pt, no answer. LVM for pt to return call to the office.

## 2021-12-11 ENCOUNTER — Telehealth: Payer: Self-pay | Admitting: Hematology and Oncology

## 2021-12-11 NOTE — Telephone Encounter (Signed)
Sch per 2/13 inbasket, pt aware

## 2021-12-14 DIAGNOSIS — M0579 Rheumatoid arthritis with rheumatoid factor of multiple sites without organ or systems involvement: Secondary | ICD-10-CM | POA: Diagnosis not present

## 2021-12-19 ENCOUNTER — Other Ambulatory Visit: Payer: Self-pay

## 2021-12-19 ENCOUNTER — Other Ambulatory Visit: Payer: Medicare PPO

## 2021-12-19 ENCOUNTER — Inpatient Hospital Stay: Payer: Medicare PPO

## 2021-12-19 ENCOUNTER — Ambulatory Visit: Payer: Medicare PPO

## 2021-12-19 VITALS — BP 151/95 | HR 63 | Temp 98.3°F | Resp 18

## 2021-12-19 DIAGNOSIS — N183 Chronic kidney disease, stage 3 unspecified: Secondary | ICD-10-CM

## 2021-12-19 DIAGNOSIS — D649 Anemia, unspecified: Secondary | ICD-10-CM

## 2021-12-19 DIAGNOSIS — D631 Anemia in chronic kidney disease: Secondary | ICD-10-CM

## 2021-12-19 DIAGNOSIS — D638 Anemia in other chronic diseases classified elsewhere: Secondary | ICD-10-CM

## 2021-12-19 LAB — CBC WITH DIFFERENTIAL (CANCER CENTER ONLY)
Abs Immature Granulocytes: 0.01 10*3/uL (ref 0.00–0.07)
Basophils Absolute: 0.1 10*3/uL (ref 0.0–0.1)
Basophils Relative: 1 %
Eosinophils Absolute: 0.1 10*3/uL (ref 0.0–0.5)
Eosinophils Relative: 2 %
HCT: 35.5 % — ABNORMAL LOW (ref 36.0–46.0)
Hemoglobin: 10.4 g/dL — ABNORMAL LOW (ref 12.0–15.0)
Immature Granulocytes: 0 %
Lymphocytes Relative: 20 %
Lymphs Abs: 1.2 10*3/uL (ref 0.7–4.0)
MCH: 26.4 pg (ref 26.0–34.0)
MCHC: 29.3 g/dL — ABNORMAL LOW (ref 30.0–36.0)
MCV: 90.1 fL (ref 80.0–100.0)
Monocytes Absolute: 0.3 10*3/uL (ref 0.1–1.0)
Monocytes Relative: 5 %
Neutro Abs: 4.3 10*3/uL (ref 1.7–7.7)
Neutrophils Relative %: 72 %
Platelet Count: 213 10*3/uL (ref 150–400)
RBC: 3.94 MIL/uL (ref 3.87–5.11)
RDW: 17.3 % — ABNORMAL HIGH (ref 11.5–15.5)
WBC Count: 5.9 10*3/uL (ref 4.0–10.5)
nRBC: 0 % (ref 0.0–0.2)

## 2021-12-19 MED ORDER — EPOETIN ALFA-EPBX 40000 UNIT/ML IJ SOLN
40000.0000 [IU] | Freq: Once | INTRAMUSCULAR | Status: DC
  Filled 2021-12-19: qty 1

## 2021-12-27 HISTORY — PX: OTHER SURGICAL HISTORY: SHX169

## 2022-01-01 NOTE — Progress Notes (Signed)
? ?Patient Care Team: ?Deland Pretty, MD as PCP - General (Internal Medicine) ?Pixie Casino, MD as PCP - Cardiology (Cardiology) ?Nicholas Lose, MD as Consulting Physician (Hematology and Oncology) ?Lahoma Rocker, MD as Consulting Physician (Rheumatology) ?Madelin Rear, MD as Consulting Physician (Endocrinology) ?Marygrace Drought, MD as Consulting Physician (Ophthalmology) ?Claudia Desanctis, MD as Consulting Physician (Nephrology) ? ?DIAGNOSIS:  ?  ICD-10-CM   ?1. Anemia in stage 3 chronic kidney disease, unspecified whether stage 3a or 3b CKD ([facility])  N18.30   ? D63.1   ?  ? ? ?CHIEF COMPLIANT: Follow-up of anemia of chronic kidney disease on Aranesp injections ? ?INTERVAL HISTORY: Jocelyn Kaufman is a 66 y.o. with above-mentioned history of anemia of chronic kidney disease who is currently receiving Aranesp injections. She presents to the clinic today for follow-up.  ?Her last Retacrit injection was on 12/05/2021.  She continues to complain of fatigue in spite of reasonable hemoglobin levels.  Her fatigue could be related to her other issues related to rheumatoid arthritis and hypothyroidism. ? ?ALLERGIES:  is allergic to ozempic (0.25 or 0.5 mg-dose) [semaglutide(0.25 or 0.'5mg'$ -dos)], jardiance [empagliflozin], latex, tramadol hcl, invokana [canagliflozin], percocet [oxycodone-acetaminophen], prilosec [omeprazole], rogaine [minoxidil], and victoza [liraglutide]. ? ?MEDICATIONS:  ?Current Outpatient Medications  ?Medication Sig Dispense Refill  ? Abatacept (ORENCIA IV) Inject 1 Package into the vein every 30 (thirty) days. (unsure of dosage)    ? Aflibercept (EYLEA) 2 MG/0.05ML SOLN 2 mg by Intravitreal route every 3 (three) months.    ? albuterol (VENTOLIN HFA) 108 (90 Base) MCG/ACT inhaler Inhale two puffs every four to six hours as needed for cough or wheeze. 18 g 2  ? amLODipine (NORVASC) 5 MG tablet Take 2.5 mg by mouth daily.     ? aspirin EC 81 MG tablet Take 1 tablet (81 mg total) by mouth daily. Swallow  whole. 30 tablet 11  ? atorvastatin (LIPITOR) 40 MG tablet Take 1 tablet (40 mg total) by mouth daily at 6 PM. 30 tablet 0  ? B-D ULTRAFINE III SHORT PEN 31G X 8 MM MISC SMARTSIG:1 Each SUB-Q Daily    ? Brinzolamide-Brimonidine (SIMBRINZA) 1-0.2 % SUSP Apply 1 drop to eye 3 (three) times daily.    ? ciclesonide (ALVESCO) 80 MCG/ACT inhaler Inhale 2 puffs into the lungs 2 (two) times daily. 1 each 5  ? ciclopirox (PENLAC) 8 % solution Apply topically at bedtime. Apply over nail and surrounding skin. Apply daily over previous coat. After seven (7) days, may remove with alcohol and continue cycle. 6.6 mL 0  ? COVID-19 mRNA bivalent vaccine, Pfizer, (PFIZER COVID-19 VAC BIVALENT) injection Inject into the muscle. 0.3 mL 0  ? Dulaglutide (TRULICITY) 1.5 ZW/2.5EN SOPN Inject into the skin. Once a week    ? EPINEPHrine (EPIPEN 2-PAK) 0.3 mg/0.3 mL IJ SOAJ injection Use as directed for life-threatening allergic reaction. 2 each 1  ? escitalopram (LEXAPRO) 20 MG tablet Take 20 mg by mouth daily.    ? ezetimibe (ZETIA) 10 MG tablet Take 1 tablet (10 mg total) by mouth daily.    ? ferrous sulfate 325 (65 FE) MG tablet Take 365 mg by mouth daily.     ? fexofenadine (ALLEGRA ALLERGY) 180 MG tablet Take 1 tablet (180 mg total) by mouth daily.    ? folic acid (FOLVITE) 1 MG tablet Take 1 mg by mouth 2 (two) times daily.     ? hydrALAZINE (APRESOLINE) 25 MG tablet Take 25 mg by mouth 3 (three) times daily.     ?  insulin aspart protamine - aspart (NOVOLOG 70/30 FLEXPEN RELION) (70-30) 100 UNIT/ML FlexPen Inject 0.15-0.2 mLs (15-20 Units total) into the skin in the morning, at noon, and at bedtime. 15 mL 11  ? insulin glargine, 2 Unit Dial, (TOUJEO MAX SOLOSTAR) 300 UNIT/ML Solostar Pen Inject 46 Units into the skin daily.    ? irbesartan (AVAPRO) 150 MG tablet Take 1 tablet (150 mg total) by mouth daily.    ? ketorolac (ACULAR) 0.4 % SOLN Place 1 drop into the right eye 4 (four) times daily.    ? latanoprost (XALATAN) 0.005 %  ophthalmic solution 1 drop at bedtime.    ? levothyroxine (SYNTHROID) 125 MCG tablet Take 125 mcg by mouth daily before breakfast. BRAND ONLY    ? LORazepam (ATIVAN) 0.5 MG tablet Take 0.5 mg by mouth as needed for anxiety.    ? Methotrexate Sodium (METHOTREXATE, PF,) 50 MG/2ML injection Inject 1 mL (25 mg total) into the muscle once a week. 1 ml once a week 2 mL   ? metoprolol succinate (TOPROL-XL) 25 MG 24 hr tablet TAKE 1 TABLET(25 MG) BY MOUTH DAILY 90 tablet 3  ? Multiple Vitamins-Minerals (CENTRUM SILVER PO) Take 1 tablet by mouth daily.    ? prednisoLONE acetate (PRED FORTE) 1 % ophthalmic suspension Place 1 drop into the right eye 4 (four) times daily.    ? sulfaSALAzine (AZULFIDINE) 500 MG tablet Take 3 tablets (1,500 mg total) by mouth 2 (two) times daily.    ? timolol (BETIMOL) 0.25 % ophthalmic solution Place 1 drop into both eyes 2 (two) times daily. 10 mL 12  ? vitamin B-12 (CYANOCOBALAMIN) 100 MCG tablet Take 100 mcg by mouth daily.     ? VITAMIN D, ERGOCALCIFEROL, PO Take 2,000 mg by mouth 2 (two) times daily.    ? ?No current facility-administered medications for this visit.  ? ? ?PHYSICAL EXAMINATION: ?ECOG PERFORMANCE STATUS: 1 - Symptomatic but completely ambulatory ? ?There were no vitals filed for this visit. ?There were no vitals filed for this visit. ? ?LABORATORY DATA:  ?I have reviewed the data as listed ?CMP Latest Ref Rng & Units 10/07/2018 07/12/2018 07/11/2018  ?Glucose 65 - 99 mg/dL 123(H) 128(H) 143(H)  ?BUN 8 - 27 mg/dL 26 47(H) 43(H)  ?Creatinine 0.57 - 1.00 mg/dL 1.13(H) 1.35(H) 1.60(H)  ?Sodium 134 - 144 mmol/L 140 136 136  ?Potassium 3.5 - 5.2 mmol/L 5.1 4.6 4.3  ?Chloride 96 - 106 mmol/L 105 103 103  ?CO2 20 - 29 mmol/L '21 23 22  '$ ?Calcium 8.7 - 10.3 mg/dL 9.4 8.7(L) 8.8(L)  ?Total Protein 6.5 - 8.1 g/dL - - -  ?Total Bilirubin 0.3 - 1.2 mg/dL - - -  ?Alkaline Phos 38 - 126 U/L - - -  ?AST 15 - 41 U/L - - -  ?ALT 0 - 44 U/L - - -  ? ? ?Lab Results  ?Component Value Date  ? WBC 6.7  01/02/2022  ? HGB 10.7 (L) 01/02/2022  ? HCT 35.7 (L) 01/02/2022  ? MCV 90.4 01/02/2022  ? PLT 205 01/02/2022  ? NEUTROABS 4.2 01/02/2022  ? ? ?ASSESSMENT & PLAN:  ?Anemia in chronic kidney disease ?Patient has history of hypothyroidism as well as possible adrenal dysfunction issues for which [facility] goes to [facility] ?  ?Previous blood work review: ?1.  Erythropoietin: 24.2 ?2. SPEP: No M protein ?3.  Haptoglobin: 146, LDH 281, absolute reticulocyte count 96.4, direct Coombs test negative ?4.  B12 750 ?5.  Folic acid 41.3 ?6.  Ferritin 88, iron saturation 25 ?7. Hemoglobin 9.6 with MCV 89 ? ----------------------------------------------------------------------------------------------------------------------------------------------- ?Etiology: Anemia of chronic disease secondary to chronic kidney disease, hypothyroidism and adrenal issues ?  ?Current treatment: Aranesp 300 mcg started 05/06/2020. ?Lab review ?03/23/2020: Erythropoietin 27.9, hemoglobin 9.6 ?02/02/2021: Hemoglobin 10.6 (given her severe symptoms continue with Aranesp) ?06/22/2021: Hemoglobin 11.3  ?11/07/2021: Hemoglobin 11.1 ?11/21/2021: Hemoglobin 10.5 (no Retacrit today) ?01/02/2022: Hemoglobin 10.7 (no Retacrit today): Last Retacrit was on 12/05/2021 ?Fatigue is persistent in spite of improvement in hemoglobin levels.  She has appointments to see her nephrologist and endocrinologist. ? ?Return to clinic in 1 month for lab check and follow-up ? ?No orders of the defined types were placed in this encounter. ? ?The patient has a good understanding of the overall plan. she agrees with it. she will call with any problems that may develop before the next visit here. ? ?Total time spent: 20 mins including face to face time and time spent for planning, charting and coordination of care ? ?Rulon Eisenmenger, MD, MPH ?01/02/2022 ? ?I, Thana Ates, am acting as scribe for Dr. Nicholas Lose. ? ?I have reviewed the above documentation for accuracy and completeness, and I agree with  the above. ? ? ? ? ? ? ?

## 2022-01-02 ENCOUNTER — Inpatient Hospital Stay: Payer: Medicare PPO | Attending: Hematology and Oncology

## 2022-01-02 ENCOUNTER — Inpatient Hospital Stay: Payer: Medicare PPO

## 2022-01-02 ENCOUNTER — Other Ambulatory Visit: Payer: Self-pay

## 2022-01-02 ENCOUNTER — Inpatient Hospital Stay: Payer: Medicare PPO | Admitting: Hematology and Oncology

## 2022-01-02 DIAGNOSIS — N183 Chronic kidney disease, stage 3 unspecified: Secondary | ICD-10-CM

## 2022-01-02 DIAGNOSIS — D631 Anemia in chronic kidney disease: Secondary | ICD-10-CM | POA: Diagnosis not present

## 2022-01-02 LAB — CBC WITH DIFFERENTIAL (CANCER CENTER ONLY)
Abs Immature Granulocytes: 0.02 10*3/uL (ref 0.00–0.07)
Basophils Absolute: 0 10*3/uL (ref 0.0–0.1)
Basophils Relative: 0 %
Eosinophils Absolute: 0.1 10*3/uL (ref 0.0–0.5)
Eosinophils Relative: 2 %
HCT: 35.7 % — ABNORMAL LOW (ref 36.0–46.0)
Hemoglobin: 10.7 g/dL — ABNORMAL LOW (ref 12.0–15.0)
Immature Granulocytes: 0 %
Lymphocytes Relative: 29 %
Lymphs Abs: 1.9 10*3/uL (ref 0.7–4.0)
MCH: 27.1 pg (ref 26.0–34.0)
MCHC: 30 g/dL (ref 30.0–36.0)
MCV: 90.4 fL (ref 80.0–100.0)
Monocytes Absolute: 0.5 10*3/uL (ref 0.1–1.0)
Monocytes Relative: 7 %
Neutro Abs: 4.2 10*3/uL (ref 1.7–7.7)
Neutrophils Relative %: 62 %
Platelet Count: 205 10*3/uL (ref 150–400)
RBC: 3.95 MIL/uL (ref 3.87–5.11)
RDW: 17.3 % — ABNORMAL HIGH (ref 11.5–15.5)
WBC Count: 6.7 10*3/uL (ref 4.0–10.5)
nRBC: 0 % (ref 0.0–0.2)

## 2022-01-02 MED ORDER — METHOTREXATE SODIUM CHEMO INJECTION (PF) 50 MG/2ML: 20.0000 mg | INTRAMUSCULAR | Status: DC

## 2022-01-02 NOTE — Assessment & Plan Note (Signed)
Patient has history of hypothyroidism as well as possible adrenal dysfunction issues for which she goes to [facility] ?? ?Previous blood work review: ?1.??Erythropoietin: 24.2 ?2. SPEP:?No M protein ?3.??Haptoglobin: 146, LDH 281, absolute reticulocyte count 96.4, direct Coombs test negative ?4.??B12 750 ?5.??Folic acid 58.5 ?6.??Ferritin 88, iron saturation 25 ?7.?Hemoglobin 9.6 with MCV 89 ??----------------------------------------------------------------------------------------------------------------------------------------------- ?Etiology: Anemia of chronic disease secondary to?chronic kidney disease,?hypothyroidism and adrenal issues ?? ?Current treatment:?Aranesp?300 mcg started?05/06/2020. ?Lab review ?03/23/2020: Erythropoietin 27.9, hemoglobin 9.6 ?02/02/2021: Hemoglobin 10.6 (given her severe symptoms?continue with Aranesp) ?06/22/2021: Hemoglobin 11.3  ?11/07/2021: Hemoglobin 11.1 ?11/21/2021: Hemoglobin 10.5 (no Aranesp today) ?? ?Return to clinic in 2 weeks for lab check and Aranesp and after that we can see if she can go to once a month for Aranesp injections ?? ? ?

## 2022-01-05 DIAGNOSIS — N182 Chronic kidney disease, stage 2 (mild): Secondary | ICD-10-CM | POA: Diagnosis not present

## 2022-01-08 DIAGNOSIS — E113512 Type 2 diabetes mellitus with proliferative diabetic retinopathy with macular edema, left eye: Secondary | ICD-10-CM | POA: Diagnosis not present

## 2022-01-11 DIAGNOSIS — M0579 Rheumatoid arthritis with rheumatoid factor of multiple sites without organ or systems involvement: Secondary | ICD-10-CM | POA: Diagnosis not present

## 2022-01-12 DIAGNOSIS — N182 Chronic kidney disease, stage 2 (mild): Secondary | ICD-10-CM | POA: Diagnosis not present

## 2022-01-12 DIAGNOSIS — E1129 Type 2 diabetes mellitus with other diabetic kidney complication: Secondary | ICD-10-CM | POA: Diagnosis not present

## 2022-01-12 DIAGNOSIS — I129 Hypertensive chronic kidney disease with stage 1 through stage 4 chronic kidney disease, or unspecified chronic kidney disease: Secondary | ICD-10-CM | POA: Diagnosis not present

## 2022-01-12 DIAGNOSIS — R809 Proteinuria, unspecified: Secondary | ICD-10-CM | POA: Diagnosis not present

## 2022-01-12 DIAGNOSIS — E1122 Type 2 diabetes mellitus with diabetic chronic kidney disease: Secondary | ICD-10-CM | POA: Diagnosis not present

## 2022-01-18 DIAGNOSIS — E785 Hyperlipidemia, unspecified: Secondary | ICD-10-CM | POA: Diagnosis not present

## 2022-01-18 DIAGNOSIS — E119 Type 2 diabetes mellitus without complications: Secondary | ICD-10-CM | POA: Diagnosis not present

## 2022-01-22 DIAGNOSIS — Z0000 Encounter for general adult medical examination without abnormal findings: Secondary | ICD-10-CM | POA: Diagnosis not present

## 2022-01-22 DIAGNOSIS — N1831 Chronic kidney disease, stage 3a: Secondary | ICD-10-CM | POA: Diagnosis not present

## 2022-01-22 DIAGNOSIS — I10 Essential (primary) hypertension: Secondary | ICD-10-CM | POA: Diagnosis not present

## 2022-01-22 DIAGNOSIS — G4733 Obstructive sleep apnea (adult) (pediatric): Secondary | ICD-10-CM | POA: Diagnosis not present

## 2022-01-22 DIAGNOSIS — M0579 Rheumatoid arthritis with rheumatoid factor of multiple sites without organ or systems involvement: Secondary | ICD-10-CM | POA: Diagnosis not present

## 2022-01-22 DIAGNOSIS — J4520 Mild intermittent asthma, uncomplicated: Secondary | ICD-10-CM | POA: Diagnosis not present

## 2022-01-22 DIAGNOSIS — G5793 Unspecified mononeuropathy of bilateral lower limbs: Secondary | ICD-10-CM | POA: Diagnosis not present

## 2022-01-22 DIAGNOSIS — E1121 Type 2 diabetes mellitus with diabetic nephropathy: Secondary | ICD-10-CM | POA: Diagnosis not present

## 2022-01-26 DIAGNOSIS — D225 Melanocytic nevi of trunk: Secondary | ICD-10-CM | POA: Diagnosis not present

## 2022-01-26 DIAGNOSIS — E039 Hypothyroidism, unspecified: Secondary | ICD-10-CM | POA: Diagnosis not present

## 2022-01-26 DIAGNOSIS — Z1283 Encounter for screening for malignant neoplasm of skin: Secondary | ICD-10-CM | POA: Diagnosis not present

## 2022-01-26 DIAGNOSIS — D485 Neoplasm of uncertain behavior of skin: Secondary | ICD-10-CM | POA: Diagnosis not present

## 2022-01-26 DIAGNOSIS — L720 Epidermal cyst: Secondary | ICD-10-CM | POA: Diagnosis not present

## 2022-01-26 DIAGNOSIS — E785 Hyperlipidemia, unspecified: Secondary | ICD-10-CM | POA: Diagnosis not present

## 2022-01-26 DIAGNOSIS — L814 Other melanin hyperpigmentation: Secondary | ICD-10-CM | POA: Diagnosis not present

## 2022-01-26 DIAGNOSIS — E1121 Type 2 diabetes mellitus with diabetic nephropathy: Secondary | ICD-10-CM | POA: Diagnosis not present

## 2022-01-26 DIAGNOSIS — I10 Essential (primary) hypertension: Secondary | ICD-10-CM | POA: Diagnosis not present

## 2022-01-31 DIAGNOSIS — M79643 Pain in unspecified hand: Secondary | ICD-10-CM | POA: Diagnosis not present

## 2022-01-31 DIAGNOSIS — M0579 Rheumatoid arthritis with rheumatoid factor of multiple sites without organ or systems involvement: Secondary | ICD-10-CM | POA: Diagnosis not present

## 2022-01-31 DIAGNOSIS — M1990 Unspecified osteoarthritis, unspecified site: Secondary | ICD-10-CM | POA: Diagnosis not present

## 2022-01-31 DIAGNOSIS — M797 Fibromyalgia: Secondary | ICD-10-CM | POA: Diagnosis not present

## 2022-01-31 DIAGNOSIS — D649 Anemia, unspecified: Secondary | ICD-10-CM | POA: Diagnosis not present

## 2022-01-31 DIAGNOSIS — M1711 Unilateral primary osteoarthritis, right knee: Secondary | ICD-10-CM | POA: Diagnosis not present

## 2022-01-31 DIAGNOSIS — M25569 Pain in unspecified knee: Secondary | ICD-10-CM | POA: Diagnosis not present

## 2022-01-31 DIAGNOSIS — Z79899 Other long term (current) drug therapy: Secondary | ICD-10-CM | POA: Diagnosis not present

## 2022-01-31 NOTE — Progress Notes (Signed)
? ?Patient Care Team: ?Deland Pretty, MD as PCP - General (Internal Medicine) ?Pixie Casino, MD as PCP - Cardiology (Cardiology) ?Nicholas Lose, MD as Consulting Physician (Hematology and Oncology) ?Lahoma Rocker, MD as Consulting Physician (Rheumatology) ?Madelin Rear, MD as Consulting Physician (Endocrinology) ?Marygrace Drought, MD as Consulting Physician (Ophthalmology) ?Claudia Desanctis, MD as Consulting Physician (Nephrology) ? ?DIAGNOSIS:  ?Encounter Diagnosis  ?Name Primary?  ? Anemia in stage 3 chronic kidney disease, unspecified whether stage 3a or 3b CKD ([place])   ? ? ?CHIEF COMPLIANT: Follow-up of anemia of chronic kidney disease on Retacrit injections. ? ?INTERVAL HISTORY: Jocelyn Kaufman is a  66 y.o. with above-mentioned history of anemia of chronic kidney disease who is currently receiving Aranesp injections. She presents to the clinic today for follow-up.  She reports that her energy levels are stable.  She is not feeling as poorly as she used to originally before restarting the Retacrit injections.  Last month we did not give her the injection because her hemoglobin was 10.7.  Today it is 10.3 and we will proceed with her injection. ? ?ALLERGIES:  is allergic to ozempic (0.25 or 0.5 mg-dose) [semaglutide(0.25 or 0.'5mg'$ -dos)], jardiance [empagliflozin], latex, tramadol hcl, invokana [canagliflozin], percocet [oxycodone-acetaminophen], prilosec [omeprazole], rogaine [minoxidil], and victoza [liraglutide]. ? ?MEDICATIONS:  ?Current Outpatient Medications  ?Medication Sig Dispense Refill  ? Ferrous Bisglycinate Chelate 28 MG CAPS Take 1 capsule by mouth daily.    ? Abatacept (ORENCIA IV) Inject 1 Package into the vein every 30 (thirty) days. (unsure of dosage)    ? Aflibercept (EYLEA) 2 MG/0.05ML SOLN 2 mg by Intravitreal route every 3 (three) months.    ? albuterol (VENTOLIN HFA) 108 (90 Base) MCG/ACT inhaler Inhale two puffs every four to six hours as needed for cough or wheeze. 18 g 2  ? amLODipine  (NORVASC) 10 MG tablet Take 1 tablet (10 mg total) by mouth daily.    ? aspirin EC 81 MG tablet Take 1 tablet (81 mg total) by mouth daily. Swallow whole. 30 tablet 11  ? atorvastatin (LIPITOR) 40 MG tablet Take 1 tablet (40 mg total) by mouth daily at 6 PM. 30 tablet 0  ? B-D ULTRAFINE III SHORT PEN 31G X 8 MM MISC SMARTSIG:1 Each SUB-Q Daily    ? ciclesonide (ALVESCO) 80 MCG/ACT inhaler Inhale 2 puffs into the lungs 2 (two) times daily. 1 each 5  ? ciclopirox (PENLAC) 8 % solution Apply topically at bedtime. Apply over nail and surrounding skin. Apply daily over previous coat. After seven (7) days, may remove with alcohol and continue cycle. 6.6 mL 0  ? COVID-19 mRNA bivalent vaccine, Pfizer, (PFIZER COVID-19 VAC BIVALENT) injection Inject into the muscle. 0.3 mL 0  ? Dulaglutide (TRULICITY) 1.5 ZG/0.1VC SOPN Inject into the skin. Once a week    ? EPINEPHrine (EPIPEN 2-PAK) 0.3 mg/0.3 mL IJ SOAJ injection Use as directed for life-threatening allergic reaction. 2 each 1  ? escitalopram (LEXAPRO) 20 MG tablet Take 20 mg by mouth daily.    ? ezetimibe (ZETIA) 10 MG tablet Take 1 tablet (10 mg total) by mouth daily.    ? fexofenadine (ALLEGRA ALLERGY) 180 MG tablet Take 1 tablet (180 mg total) by mouth daily.    ? folic acid (FOLVITE) 1 MG tablet Take 1 mg by mouth 2 (two) times daily.     ? hydrALAZINE (APRESOLINE) 50 MG tablet Take 1 tablet (50 mg total) by mouth 3 (three) times daily.    ? insulin aspart protamine -  aspart (NOVOLOG 70/30 FLEXPEN RELION) (70-30) 100 UNIT/ML FlexPen Inject 0.15-0.2 mLs (15-20 Units total) into the skin in the morning, at noon, and at bedtime. 15 mL 11  ? insulin glargine, 2 Unit Dial, (TOUJEO MAX SOLOSTAR) 300 UNIT/ML Solostar Pen Inject 46 Units into the skin daily.    ? irbesartan (AVAPRO) 300 MG tablet Take 1 tablet (300 mg total) by mouth daily.    ? latanoprost (XALATAN) 0.005 % ophthalmic solution 1 drop at bedtime.    ? levothyroxine (SYNTHROID) 125 MCG tablet Take 125 mcg by  mouth daily before breakfast. BRAND ONLY    ? LORazepam (ATIVAN) 0.5 MG tablet Take 0.5 mg by mouth as needed for anxiety.    ? Methotrexate Sodium (METHOTREXATE, PF,) 50 MG/2ML injection Inject 0.8 mLs (20 mg total) into the muscle once a week. 1 ml once a week 2 mL   ? Multiple Vitamins-Minerals (CENTRUM SILVER PO) Take 1 tablet by mouth daily.    ? sulfaSALAzine (AZULFIDINE) 500 MG tablet Take 3 tablets (1,500 mg total) by mouth 2 (two) times daily.    ? timolol (BETIMOL) 0.25 % ophthalmic solution Place 1 drop into both eyes 2 (two) times daily. 10 mL 12  ? vitamin B-12 (CYANOCOBALAMIN) 100 MCG tablet Take 100 mcg by mouth daily.     ? VITAMIN D, ERGOCALCIFEROL, PO Take 2,000 mg by mouth 2 (two) times daily.    ? ?No current facility-administered medications for this visit.  ? ?Facility-Administered Medications Ordered in Other Visits  ?Medication Dose Route Frequency Provider Last Rate Last Admin  ? epoetin alfa-epbx (RETACRIT) injection 40,000 Units  40,000 Units Subcutaneous Once Causey, Charlestine Massed, NP      ? ? ?PHYSICAL EXAMINATION: ?ECOG PERFORMANCE STATUS: 1 - Symptomatic but completely ambulatory ? ?Vitals:  ? 02/01/22 0818  ?BP: (!) 128/58  ?Pulse: 66  ?Resp: 18  ?Temp: (!) 97.2 ?F (36.2 ?C)  ?SpO2: 98%  ? ?Filed Weights  ? 02/01/22 0818  ?Weight: 240 lb 9.6 oz (109.1 kg)  ? ?  ? ?LABORATORY DATA:  ?I have reviewed the data as listed ? ?  Latest Ref Rng & Units 10/07/2018  ?  9:08 AM 07/12/2018  ?  7:52 AM 07/11/2018  ? 12:01 PM  ?CMP  ?Glucose 65 - 99 mg/dL 123   128   143    ?BUN 8 - 27 mg/dL 26   47   43    ?Creatinine 0.57 - 1.00 mg/dL 1.13   1.35   1.60    ?Sodium 134 - 144 mmol/L 140   136   136    ?Potassium 3.5 - 5.2 mmol/L 5.1   4.6   4.3    ?Chloride 96 - 106 mmol/L 105   103   103    ?CO2 20 - 29 mmol/L '21   23   22    '$ ?Calcium 8.7 - 10.3 mg/dL 9.4   8.7   8.8    ? ? ?Lab Results  ?Component Value Date  ? WBC 6.4 02/01/2022  ? HGB 10.3 (L) 02/01/2022  ? HCT 34.5 (L) 02/01/2022  ? MCV 86.9  02/01/2022  ? PLT 192 02/01/2022  ? NEUTROABS 3.8 02/01/2022  ? ? ?ASSESSMENT & PLAN:  ?Anemia in chronic kidney disease ?Patient has history of hypothyroidism as well as possible adrenal dysfunction issues for which she goes to [facility] ?  ?Previous blood work review: ?1.  Erythropoietin: 24.2 ?2. SPEP: No M protein ?3.  Haptoglobin: 146, LDH  281, absolute reticulocyte count 96.4, direct Coombs test negative ?4.  B12 750 ?5.  Folic acid 62.1 ?6.  Ferritin 88, iron saturation 25 ?7. Hemoglobin 9.6 with MCV 89 ? ----------------------------------------------------------------------------------------------------------------------------------------------- ?Etiology: Anemia of chronic disease secondary to chronic kidney disease, hypothyroidism and adrenal issues ?  ?Current treatment: Aranesp 300 mcg started 05/06/2020.  (Retacrit injections for hemoglobin 10.5 or less) ?Lab review ?03/23/2020: Erythropoietin 27.9, hemoglobin 9.6 ?02/02/2021: Hemoglobin 10.6 (given her severe symptoms continue with Aranesp) ?06/22/2021: Hemoglobin 11.3  ?11/07/2021: Hemoglobin 11.1 ?11/21/2021: Hemoglobin 10.5 (no Retacrit today) ?01/02/2022: Hemoglobin 10.7 (no Retacrit today): Last Retacrit was on 12/05/2021  ?02/01/2022: Hemoglobin 10.3: Proceeding to Retacrit today ?  ?Return to clinic monthly for lab checks and Retacrit injection if needed.  Follow-up with me in 3 months ? ? ? ?No orders of the defined types were placed in this encounter. ? ?The patient has a good understanding of the overall plan. she agrees with it. she will call with any problems that may develop before the next visit here. ?Total time spent: 30 mins including face to face time and time spent for planning, charting and co-ordination of care ? ? Harriette Ohara, MD ?02/01/22 ? ?I Gardiner Coins am scribing for Dr. Lindi Adie ? ?I have reviewed the above documentation for accuracy and completeness, and I agree with the above. ?  ?  ?

## 2022-02-01 ENCOUNTER — Inpatient Hospital Stay: Payer: Medicare PPO

## 2022-02-01 ENCOUNTER — Other Ambulatory Visit: Payer: Self-pay

## 2022-02-01 ENCOUNTER — Inpatient Hospital Stay: Payer: Medicare PPO | Attending: Hematology and Oncology

## 2022-02-01 ENCOUNTER — Inpatient Hospital Stay: Payer: Medicare PPO | Admitting: Hematology and Oncology

## 2022-02-01 DIAGNOSIS — N183 Chronic kidney disease, stage 3 unspecified: Secondary | ICD-10-CM

## 2022-02-01 DIAGNOSIS — D631 Anemia in chronic kidney disease: Secondary | ICD-10-CM | POA: Diagnosis not present

## 2022-02-01 DIAGNOSIS — D649 Anemia, unspecified: Secondary | ICD-10-CM

## 2022-02-01 DIAGNOSIS — D638 Anemia in other chronic diseases classified elsewhere: Secondary | ICD-10-CM

## 2022-02-01 LAB — CBC WITH DIFFERENTIAL (CANCER CENTER ONLY)
Abs Immature Granulocytes: 0.01 10*3/uL (ref 0.00–0.07)
Basophils Absolute: 0 10*3/uL (ref 0.0–0.1)
Basophils Relative: 1 %
Eosinophils Absolute: 0.2 10*3/uL (ref 0.0–0.5)
Eosinophils Relative: 2 %
HCT: 34.5 % — ABNORMAL LOW (ref 36.0–46.0)
Hemoglobin: 10.3 g/dL — ABNORMAL LOW (ref 12.0–15.0)
Immature Granulocytes: 0 %
Lymphocytes Relative: 31 %
Lymphs Abs: 2 10*3/uL (ref 0.7–4.0)
MCH: 25.9 pg — ABNORMAL LOW (ref 26.0–34.0)
MCHC: 29.9 g/dL — ABNORMAL LOW (ref 30.0–36.0)
MCV: 86.9 fL (ref 80.0–100.0)
Monocytes Absolute: 0.4 10*3/uL (ref 0.1–1.0)
Monocytes Relative: 6 %
Neutro Abs: 3.8 10*3/uL (ref 1.7–7.7)
Neutrophils Relative %: 60 %
Platelet Count: 192 10*3/uL (ref 150–400)
RBC: 3.97 MIL/uL (ref 3.87–5.11)
RDW: 16.3 % — ABNORMAL HIGH (ref 11.5–15.5)
WBC Count: 6.4 10*3/uL (ref 4.0–10.5)
nRBC: 0 % (ref 0.0–0.2)

## 2022-02-01 MED ORDER — IRBESARTAN 300 MG PO TABS
300.0000 mg | ORAL_TABLET | Freq: Every day | ORAL | Status: AC
Start: 2022-02-01 — End: ?

## 2022-02-01 MED ORDER — HYDRALAZINE HCL 50 MG PO TABS: 50.0000 mg | ORAL_TABLET | Freq: Three times a day (TID) | ORAL | Status: DC

## 2022-02-01 MED ORDER — AMLODIPINE BESYLATE 10 MG PO TABS: 10.0000 mg | ORAL_TABLET | Freq: Every day | ORAL | Status: DC

## 2022-02-01 MED ORDER — FERROUS BISGLYCINATE CHELATE 28 MG PO CAPS
1.0000 | ORAL_CAPSULE | Freq: Every day | ORAL | Status: DC
Start: 2022-02-01 — End: 2022-03-29

## 2022-02-01 MED ORDER — EPOETIN ALFA-EPBX 40000 UNIT/ML IJ SOLN
40000.0000 [IU] | Freq: Once | INTRAMUSCULAR | Status: AC
  Administered 2022-02-01: 40000 [IU] via SUBCUTANEOUS
  Filled 2022-02-01: qty 1

## 2022-02-01 NOTE — Patient Instructions (Signed)
Epoetin Alfa injection °What is this medication? °EPOETIN ALFA (e POE e tin AL fa) helps your body make more red blood cells. This medicine is used to treat anemia caused by chronic kidney disease, cancer chemotherapy, or HIV-therapy. It may also be used before surgery if you have anemia. °This medicine may be used for other purposes; ask your health care provider or pharmacist if you have questions. °COMMON BRAND NAME(S): Epogen, Procrit, Retacrit °What should I tell my care team before I take this medication? °They need to know if you have any of these conditions: °cancer °heart disease °high blood pressure °history of blood clots °history of stroke °low levels of folate, iron, or vitamin B12 in the blood °seizures °an unusual or allergic reaction to erythropoietin, albumin, benzyl alcohol, hamster proteins, other medicines, foods, dyes, or preservatives °pregnant or trying to get pregnant °breast-feeding °How should I use this medication? °This medicine is for injection into a vein or under the skin. It is usually given by a health care professional in a hospital or clinic setting. °If you get this medicine at home, you will be taught how to prepare and give this medicine. Use exactly as directed. Take your medicine at regular intervals. Do not take your medicine more often than directed. °It is important that you put your used needles and syringes in a special sharps container. Do not put them in a trash can. If you do not have a sharps container, call your pharmacist or healthcare provider to get one. °A special MedGuide will be given to you by the pharmacist with each prescription and refill. Be sure to read this information carefully each time. °Talk to your pediatrician regarding the use of this medicine in children. While this drug may be prescribed for selected conditions, precautions do apply. °Overdosage: If you think you have taken too much of this medicine contact a poison control center or emergency  room at once. °NOTE: This medicine is only for you. Do not share this medicine with others. °What if I miss a dose? °If you miss a dose, take it as soon as you can. If it is almost time for your next dose, take only that dose. Do not take double or extra doses. °What may interact with this medication? °Interactions have not been studied. °This list may not describe all possible interactions. Give your health care provider a list of all the medicines, herbs, non-prescription drugs, or dietary supplements you use. Also tell them if you smoke, drink alcohol, or use illegal drugs. Some items may interact with your medicine. °What should I watch for while using this medication? °Your condition will be monitored carefully while you are receiving this medicine. °You may need blood work done while you are taking this medicine. °This medicine may cause a decrease in vitamin B6. You should make sure that you get enough vitamin B6 while you are taking this medicine. Discuss the foods you eat and the vitamins you take with your health care professional. °What side effects may I notice from receiving this medication? °Side effects that you should report to your doctor or health care professional as soon as possible: °allergic reactions like skin rash, itching or hives, swelling of the face, lips, or tongue °seizures °signs and symptoms of a blood clot such as breathing problems; changes in vision; chest pain; severe, sudden headache; pain, swelling, warmth in the leg; trouble speaking; sudden numbness or weakness of the face, arm or leg °signs and symptoms of a stroke like   changes in vision; confusion; trouble speaking or understanding; severe headaches; sudden numbness or weakness of the face, arm or leg; trouble walking; dizziness; loss of balance or coordination °Side effects that usually do not require medical attention (report to your doctor or health care professional if they continue or are  bothersome): °chills °cough °dizziness °fever °headaches °joint pain °muscle cramps °muscle pain °nausea, vomiting °pain, redness, or irritation at site where injected °This list may not describe all possible side effects. Call your doctor for medical advice about side effects. You may report side effects to FDA at 1-800-FDA-1088. °Where should I keep my medication? °Keep out of the reach of children. °Store in a refrigerator between 2 and 8 degrees C (36 and 46 degrees F). Do not freeze or shake. Throw away any unused portion if using a single-dose vial. Multi-dose vials can be kept in the refrigerator for up to 21 days after the initial dose. Throw away unused medicine. °NOTE: This sheet is a summary. It may not cover all possible information. If you have questions about this medicine, talk to your doctor, pharmacist, or health care provider. °© 2022 [employer]vier/Gold Standard (2017-06-18 00:00:00) ° °

## 2022-02-01 NOTE — Assessment & Plan Note (Signed)
Patient has history of hypothyroidism as well as possible adrenal dysfunction issues for which she goes to [facility] ?? ?Previous blood work review: ?1.??Erythropoietin: 24.2 ?2. SPEP:?No M protein ?3.??Haptoglobin: 146, LDH 281, absolute reticulocyte count 96.4, direct Coombs test negative ?4.??B12 750 ?5.??Folic acid 31.5 ?6.??Ferritin 88, iron saturation 25 ?7.?Hemoglobin 9.6 with MCV 89 ??----------------------------------------------------------------------------------------------------------------------------------------------- ?Etiology: Anemia of chronic disease secondary to?chronic kidney disease,?hypothyroidism and adrenal issues ?? ?Current treatment:?Aranesp?300 mcg started?05/06/2020. ?Lab review ?03/23/2020: Erythropoietin 27.9, hemoglobin 9.6 ?02/02/2021: Hemoglobin 10.6 (given her severe symptoms?continue with Aranesp) ?06/22/2021: Hemoglobin 11.3? ?11/07/2021: Hemoglobin 11.1 ?11/21/2021: Hemoglobin 10.5 (no Retacrit today) ?01/02/2022: Hemoglobin 10.7 (no Retacrit today): Last Retacrit was on 12/05/2021  ?02/01/2022: ?? ?Return to clinic monthly for lab checks and Retacrit injection if needed.  Follow-up with me in 3 months ?

## 2022-02-08 DIAGNOSIS — M0579 Rheumatoid arthritis with rheumatoid factor of multiple sites without organ or systems involvement: Secondary | ICD-10-CM | POA: Diagnosis not present

## 2022-02-13 ENCOUNTER — Ambulatory Visit: Payer: Medicare PPO | Admitting: Podiatry

## 2022-02-13 DIAGNOSIS — L84 Corns and callosities: Secondary | ICD-10-CM

## 2022-02-13 DIAGNOSIS — M79675 Pain in left toe(s): Secondary | ICD-10-CM

## 2022-02-13 DIAGNOSIS — E119 Type 2 diabetes mellitus without complications: Secondary | ICD-10-CM

## 2022-02-13 DIAGNOSIS — M79674 Pain in right toe(s): Secondary | ICD-10-CM

## 2022-02-13 DIAGNOSIS — B351 Tinea unguium: Secondary | ICD-10-CM | POA: Diagnosis not present

## 2022-02-13 DIAGNOSIS — E1142 Type 2 diabetes mellitus with diabetic polyneuropathy: Secondary | ICD-10-CM

## 2022-02-20 ENCOUNTER — Encounter: Payer: Self-pay | Admitting: Podiatry

## 2022-02-20 DIAGNOSIS — Z6841 Body Mass Index (BMI) 40.0 and over, adult: Secondary | ICD-10-CM | POA: Insufficient documentation

## 2022-02-20 DIAGNOSIS — G5793 Unspecified mononeuropathy of bilateral lower limbs: Secondary | ICD-10-CM | POA: Insufficient documentation

## 2022-02-20 DIAGNOSIS — N1831 Chronic kidney disease, stage 3a: Secondary | ICD-10-CM | POA: Insufficient documentation

## 2022-02-20 DIAGNOSIS — Z780 Asymptomatic menopausal state: Secondary | ICD-10-CM | POA: Insufficient documentation

## 2022-02-20 DIAGNOSIS — M179 Osteoarthritis of knee, unspecified: Secondary | ICD-10-CM | POA: Insufficient documentation

## 2022-02-20 DIAGNOSIS — N19 Unspecified kidney failure: Secondary | ICD-10-CM | POA: Insufficient documentation

## 2022-02-20 DIAGNOSIS — F3342 Major depressive disorder, recurrent, in full remission: Secondary | ICD-10-CM | POA: Insufficient documentation

## 2022-02-20 DIAGNOSIS — E1121 Type 2 diabetes mellitus with diabetic nephropathy: Secondary | ICD-10-CM | POA: Insufficient documentation

## 2022-02-20 DIAGNOSIS — Z0000 Encounter for general adult medical examination without abnormal findings: Secondary | ICD-10-CM | POA: Insufficient documentation

## 2022-02-20 DIAGNOSIS — D84821 Immunodeficiency due to drugs: Secondary | ICD-10-CM | POA: Insufficient documentation

## 2022-02-20 DIAGNOSIS — F411 Generalized anxiety disorder: Secondary | ICD-10-CM | POA: Insufficient documentation

## 2022-02-20 DIAGNOSIS — E559 Vitamin D deficiency, unspecified: Secondary | ICD-10-CM | POA: Insufficient documentation

## 2022-02-20 DIAGNOSIS — J4520 Mild intermittent asthma, uncomplicated: Secondary | ICD-10-CM | POA: Insufficient documentation

## 2022-02-20 DIAGNOSIS — I6529 Occlusion and stenosis of unspecified carotid artery: Secondary | ICD-10-CM | POA: Insufficient documentation

## 2022-02-20 DIAGNOSIS — Z8739 Personal history of other diseases of the musculoskeletal system and connective tissue: Secondary | ICD-10-CM | POA: Insufficient documentation

## 2022-02-20 DIAGNOSIS — G4733 Obstructive sleep apnea (adult) (pediatric): Secondary | ICD-10-CM | POA: Insufficient documentation

## 2022-02-20 DIAGNOSIS — I4729 Other ventricular tachycardia: Secondary | ICD-10-CM | POA: Insufficient documentation

## 2022-02-20 DIAGNOSIS — E11319 Type 2 diabetes mellitus with unspecified diabetic retinopathy without macular edema: Secondary | ICD-10-CM | POA: Insufficient documentation

## 2022-02-20 DIAGNOSIS — E270 Other adrenocortical overactivity: Secondary | ICD-10-CM | POA: Insufficient documentation

## 2022-02-20 DIAGNOSIS — E049 Nontoxic goiter, unspecified: Secondary | ICD-10-CM | POA: Insufficient documentation

## 2022-02-20 DIAGNOSIS — E039 Hypothyroidism, unspecified: Secondary | ICD-10-CM | POA: Insufficient documentation

## 2022-02-20 DIAGNOSIS — H269 Unspecified cataract: Secondary | ICD-10-CM | POA: Insufficient documentation

## 2022-02-20 DIAGNOSIS — R292 Abnormal reflex: Secondary | ICD-10-CM | POA: Insufficient documentation

## 2022-02-20 DIAGNOSIS — Z8673 Personal history of transient ischemic attack (TIA), 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 deficits: Secondary | ICD-10-CM | POA: Insufficient documentation

## 2022-02-20 DIAGNOSIS — N182 Chronic kidney disease, stage 2 (mild): Secondary | ICD-10-CM | POA: Insufficient documentation

## 2022-02-20 DIAGNOSIS — M797 Fibromyalgia: Secondary | ICD-10-CM | POA: Insufficient documentation

## 2022-02-20 DIAGNOSIS — F419 Anxiety disorder, unspecified: Secondary | ICD-10-CM | POA: Insufficient documentation

## 2022-02-20 DIAGNOSIS — Z800 Family history of malignant neoplasm of digestive organs: Secondary | ICD-10-CM | POA: Insufficient documentation

## 2022-02-20 DIAGNOSIS — N644 Mastodynia: Secondary | ICD-10-CM | POA: Insufficient documentation

## 2022-02-20 DIAGNOSIS — M5430 Sciatica, unspecified side: Secondary | ICD-10-CM | POA: Insufficient documentation

## 2022-02-20 DIAGNOSIS — M791 Myalgia, unspecified site: Secondary | ICD-10-CM | POA: Insufficient documentation

## 2022-02-20 NOTE — Progress Notes (Signed)
ANNUAL DIABETIC FOOT EXAM ? ?Subjective: ?Jocelyn Kaufman presents today for annual diabetic foot examination. ? ?Patient relates dx of diabetes. ? ?Patient denies any h/o foot wounds. ? ?Patient has been diagnosed with neuropathy. ? ?Patient's blood sugar was 123 mg/dl today. Last known HgA1c was 7.1%. ? ?Risk factors: diabetes, diabetic neuropathy, h/o TIA, CKD, hyperlipidemia, diabetic renal disease. ? ?Deland Pretty, MD is patient's PCP. Last visit was February 12, 2022. ? ?Patient states she uses Urea cream, shea butter and almond oil to moisturize her feet. She also states she has h/o biopsy/excision lesion on plantar aspect of left foot. ? ?Past Medical History:  ?Diagnosis Date  ? Adrenal adenoma   ? Angio-edema   ? Asthma   ? 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 Depression   ? Diabetes mellitus without complication ([place])   ? Hyperlipidemia   ? Hypertension   ? Hypothyroidism   ? Meralgia paresthetica of right side 01/10/2017  ? Migraine   ? Pericardial effusion   ? 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 Urticaria   ? ?Patient Active Problem List  ? Diagnosis Date Noted  ? Abnormal reflex 02/20/2022  ? Anxiety 02/20/2022  ? Body mass index (BMI) 40.0-44.9, adult ([place]) 02/20/2022  ? Breast tenderness 02/20/2022  ? Carotid artery occlusion 02/20/2022  ? Cataract 02/20/2022  ? Chronic kidney disease, stage 3a ([place]) 02/20/2022  ? Diabetic renal disease ([place]) 02/20/2022  ? Diabetic retinopathy associated with type 2 diabetes mellitus ([place]) 02/20/2022  ? Encounter for general adult medical examination without abnormal findings 02/20/2022  ? Family history of malignant neoplasm of digestive organs 02/20/2022  ? Fibromyalgia 02/20/2022  ? Generalized anxiety disorder 02/20/2022  ? History of musculoskeletal disease 02/20/2022  ? Hypothyroid 02/20/2022  ? 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 Morbid obesity ([place]) 02/20/2022  ? Mild intermittent asthma 02/20/2022  ? Myalgia 02/20/2022  ? Neuropathy of both feet  02/20/2022  ? Non-toxic nodular goiter 02/20/2022  ? Osteoarthritis of knee 02/20/2022  ? Obstructive sleep apnea syndrome 02/20/2022  ? 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 Paroxysmal ventricular tachycardia ([place]) 02/20/2022  ? Postmenopausal state 02/20/2022  ? Personal history of transient ischemic attack (TIA), 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 deficits 02/20/2022  ? Recurrent major depression in full remission ([place]) 02/20/2022  ? Renal failure syndrome 02/20/2022  ? Sciatica 02/20/2022  ? Vitamin D deficiency 02/20/2022  ? Bilateral hearing loss 08/29/2020  ? Dysequilibrium 08/29/2020  ? Anemia in chronic kidney disease 06/17/2020  ? Anemia of chronic disease 04/25/2020  ? Impingement syndrome of right shoulder region 11/25/2018  ? Osteoarthritis of right shoulder 11/03/2018  ? Pain in joint of right shoulder 11/03/2018  ? Fall at home, initial encounter 07/06/2018  ? Displaced spiral fracture of shaft of humerus, right arm, initial encounter for closed fracture 07/06/2018  ? TIA (transient ischemic attack) 07/06/2018  ? Allergic rhinitis 07/04/2018  ? Normochromic normocytic anemia 01/20/2018  ? Impingement syndrome of left shoulder 12/17/2017  ? Rheumatoid arthritis ([place]) 04/30/2017  ? Lesion of left humerus 04/29/2017  ? Adrenal adenoma 03/19/2017  ? Neuropathy 03/19/2017  ? Spinal stenosis 03/19/2017  ? Pericardial effusion 03/14/2017  ? Diabetes mellitus ([place]) 03/14/2017  ? Essential hypertension 03/14/2017  ? Mixed hyperlipidemia 03/14/2017  ? 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 Meralgia paresthetica of right side 01/10/2017  ? History of Cushing disease 03/01/2016  ? Allergic rhinoconjunctivitis 07/09/2015  ? Asthma, well controlled 07/09/2015  ? Laryngopharyngeal  reflux (LPR) 07/09/2015  ? ?Past Surgical History:  ?Procedure Laterality Date  ? ADENOIDECTOMY    ? APPENDECTOMY    ? CYST REMOVAL LEG Right   ? Removed from right thigh joint  ? ORIF HUMERUS FRACTURE Right  07/09/2018  ? Procedure: OPEN REDUCTION INTERNAL FIXATION (ORIF) HUMERAL SHAFT FRACTURE;  Surgeon: Hiram Gash, MD;  Location: [facility];  Service: Orthopedics;  Laterality: Right;  ? TONSILLECTOMY    ? TOTAL VAGINAL HYSTERECTOMY    ? UMBILICAL HERNIA REPAIR    ? ?Current Outpatient Medications on File Prior to Visit  ?Medication Sig Dispense Refill  ? Accu-Chek Softclix Lancets lancets use to check blood sugar    ? HYDROcodone-acetaminophen (NORCO/VICODIN) 5-325 MG tablet 1 tablet as needed    ? Lancets Misc. (ACCU-CHEK SOFTCLIX LANCET DEV) KIT See admin instructions.    ? tocilizumab (ACTEMRA) 400 MG/20ML SOLN injection See admin instructions.    ? Abatacept (ORENCIA IV) Inject 1 Package into the vein every 30 (thirty) days. (unsure of dosage)    ? acetaminophen (ACETAMINOPHEN EXTRA STRENGTH) 500 MG tablet 2 tablet as needed    ? acetaZOLAMIDE ER (DIAMOX) 500 MG capsule Take by mouth.    ? Aflibercept (EYLEA) 2 MG/0.05ML SOLN 2 mg by Intravitreal route every 3 (three) months.    ? albuterol (VENTOLIN HFA) 108 (90 Base) MCG/ACT inhaler Inhale two puffs every four to six hours as needed for cough or wheeze. 18 g 2  ? amLODipine (NORVASC) 10 MG tablet Take 1 tablet (10 mg total) by mouth daily.    ? amLODipine (NORVASC) 10 MG tablet Take 1 tablet by mouth daily.    ? aspirin 81 MG EC tablet 1 tablet    ? aspirin EC 81 MG tablet Take 1 tablet (81 mg total) by mouth daily. Swallow whole. 30 tablet 11  ? atorvastatin (LIPITOR) 40 MG tablet Take 1 tablet (40 mg total) by mouth daily at 6 PM. 30 tablet 0  ? atorvastatin (LIPITOR) 40 MG tablet Take 1 tablet by mouth daily.    ? B-D ULTRAFINE III SHORT PEN 31G X 8 MM MISC SMARTSIG:1 Each SUB-Q Daily    ? ciclesonide (ALVESCO) 160 MCG/ACT inhaler 1 puff    ? ciclesonide (ALVESCO) 80 MCG/ACT inhaler Inhale 2 puffs into the lungs 2 (two) times daily. 1 each 5  ? ciclopirox (PENLAC) 8 % solution Apply topically at bedtime. Apply over nail and surrounding skin. Apply daily over  previous coat. After seven (7) days, may remove with alcohol and continue cycle. 6.6 mL 0  ? COVID-19 mRNA bivalent vaccine, Pfizer, (PFIZER COVID-19 VAC BIVALENT) injection Inject into the muscle. 0.3 mL 0  ? Difluprednate 0.05 % EMUL Apply to eye.    ? Dulaglutide (TRULICITY) 1.5 RS/8.5IO SOPN Inject into the skin. Once a week    ? EPINEPHrine (EPIPEN 2-PAK) 0.3 mg/0.3 mL IJ SOAJ injection Use as directed for life-threatening allergic reaction. 2 each 1  ? epoetin alfa-epbx (RETACRIT) 2000 UNIT/ML injection See admin instructions.    ? escitalopram (LEXAPRO) 20 MG tablet Take 20 mg by mouth daily.    ? ezetimibe (ZETIA) 10 MG tablet Take 1 tablet (10 mg total) by mouth daily.    ? Ferrous Bisglycinate Chelate 28 MG CAPS Take 1 capsule by mouth daily.    ? ferrous gluconate (FERGON) 324 MG tablet 1 tablet with water or juice between meals    ? fexofenadine (ALLEGRA ALLERGY) 180 MG tablet Take 1 tablet (180 mg total)  by mouth daily.    ? folic acid (FOLVITE) 1 MG tablet Take 1 mg by mouth 2 (two) times daily.     ? hydrALAZINE (APRESOLINE) 25 MG tablet Take 1 tablet by mouth 3 (three) times daily.    ? hydrALAZINE (APRESOLINE) 50 MG tablet Take 1 tablet (50 mg total) by mouth 3 (three) times daily.    ? insulin aspart protamine - aspart (NOVOLOG 70/30 FLEXPEN RELION) (70-30) 100 UNIT/ML FlexPen Inject 0.15-0.2 mLs (15-20 Units total) into the skin in the morning, at noon, and at bedtime. 15 mL 11  ? insulin glargine, 2 Unit Dial, (TOUJEO MAX SOLOSTAR) 300 UNIT/ML Solostar Pen Inject 46 Units into the skin daily.    ? irbesartan (AVAPRO) 300 MG tablet Take 1 tablet (300 mg total) by mouth daily.    ? ketorolac (ACULAR) 0.5 % ophthalmic solution     ? latanoprost (XALATAN) 0.005 % ophthalmic solution 1 drop at bedtime.    ? latanoprost (XALATAN) 0.005 % ophthalmic solution 1 drop into affected eye in the evening    ? levothyroxine (SYNTHROID) 125 MCG tablet Take 125 mcg by mouth daily before breakfast. BRAND ONLY    ?  levothyroxine (SYNTHROID) 125 MCG tablet See admin instructions.    ? LORazepam (ATIVAN) 0.5 MG tablet Take 0.5 mg by mouth as needed for anxiety.    ? metaxalone (SKELAXIN) 800 MG tablet 1 tablet    ? M

## 2022-02-21 DIAGNOSIS — H401131 Primary open-angle glaucoma, bilateral, mild stage: Secondary | ICD-10-CM | POA: Diagnosis not present

## 2022-02-21 DIAGNOSIS — H2000 Unspecified acute and subacute iridocyclitis: Secondary | ICD-10-CM | POA: Diagnosis not present

## 2022-02-21 DIAGNOSIS — Z961 Presence of intraocular lens: Secondary | ICD-10-CM | POA: Diagnosis not present

## 2022-02-28 DIAGNOSIS — E113491 Type 2 diabetes mellitus with severe nonproliferative diabetic retinopathy without macular edema, right eye: Secondary | ICD-10-CM | POA: Diagnosis not present

## 2022-03-01 ENCOUNTER — Inpatient Hospital Stay: Payer: Medicare PPO | Attending: Hematology and Oncology

## 2022-03-01 ENCOUNTER — Inpatient Hospital Stay: Payer: Medicare PPO

## 2022-03-01 ENCOUNTER — Other Ambulatory Visit: Payer: Self-pay

## 2022-03-01 DIAGNOSIS — D631 Anemia in chronic kidney disease: Secondary | ICD-10-CM | POA: Diagnosis not present

## 2022-03-01 DIAGNOSIS — N183 Chronic kidney disease, stage 3 unspecified: Secondary | ICD-10-CM | POA: Diagnosis not present

## 2022-03-01 LAB — CBC WITH DIFFERENTIAL (CANCER CENTER ONLY)
Abs Immature Granulocytes: 0.01 10*3/uL (ref 0.00–0.07)
Basophils Absolute: 0.1 10*3/uL (ref 0.0–0.1)
Basophils Relative: 1 %
Eosinophils Absolute: 0.2 10*3/uL (ref 0.0–0.5)
Eosinophils Relative: 4 %
HCT: 37.2 % (ref 36.0–46.0)
Hemoglobin: 11.3 g/dL — ABNORMAL LOW (ref 12.0–15.0)
Immature Granulocytes: 0 %
Lymphocytes Relative: 33 %
Lymphs Abs: 2.1 10*3/uL (ref 0.7–4.0)
MCH: 26.7 pg (ref 26.0–34.0)
MCHC: 30.4 g/dL (ref 30.0–36.0)
MCV: 87.7 fL (ref 80.0–100.0)
Monocytes Absolute: 0.5 10*3/uL (ref 0.1–1.0)
Monocytes Relative: 8 %
Neutro Abs: 3.5 10*3/uL (ref 1.7–7.7)
Neutrophils Relative %: 54 %
Platelet Count: 144 10*3/uL — ABNORMAL LOW (ref 150–400)
RBC: 4.24 MIL/uL (ref 3.87–5.11)
RDW: 16.7 % — ABNORMAL HIGH (ref 11.5–15.5)
WBC Count: 6.3 10*3/uL (ref 4.0–10.5)
nRBC: 0 % (ref 0.0–0.2)

## 2022-03-01 NOTE — Progress Notes (Signed)
Patients Hg was 11.3 and contacted her MD he stated no need for a injection this visit closed out visit. ?

## 2022-03-06 DIAGNOSIS — M0579 Rheumatoid arthritis with rheumatoid factor of multiple sites without organ or systems involvement: Secondary | ICD-10-CM | POA: Diagnosis not present

## 2022-03-06 DIAGNOSIS — E039 Hypothyroidism, unspecified: Secondary | ICD-10-CM | POA: Diagnosis not present

## 2022-03-06 DIAGNOSIS — L03116 Cellulitis of left lower limb: Secondary | ICD-10-CM | POA: Diagnosis not present

## 2022-03-08 DIAGNOSIS — E039 Hypothyroidism, unspecified: Secondary | ICD-10-CM | POA: Diagnosis not present

## 2022-03-08 DIAGNOSIS — L03116 Cellulitis of left lower limb: Secondary | ICD-10-CM | POA: Diagnosis not present

## 2022-03-08 DIAGNOSIS — M0579 Rheumatoid arthritis with rheumatoid factor of multiple sites without organ or systems involvement: Secondary | ICD-10-CM | POA: Diagnosis not present

## 2022-03-12 DIAGNOSIS — M0579 Rheumatoid arthritis with rheumatoid factor of multiple sites without organ or systems involvement: Secondary | ICD-10-CM | POA: Diagnosis not present

## 2022-03-12 DIAGNOSIS — E039 Hypothyroidism, unspecified: Secondary | ICD-10-CM | POA: Diagnosis not present

## 2022-03-12 DIAGNOSIS — L03116 Cellulitis of left lower limb: Secondary | ICD-10-CM | POA: Diagnosis not present

## 2022-03-16 DIAGNOSIS — L03116 Cellulitis of left lower limb: Secondary | ICD-10-CM | POA: Diagnosis not present

## 2022-03-16 DIAGNOSIS — M0579 Rheumatoid arthritis with rheumatoid factor of multiple sites without organ or systems involvement: Secondary | ICD-10-CM | POA: Diagnosis not present

## 2022-03-19 NOTE — Progress Notes (Signed)
Jocelyn Kaufman, Jocelyn Kaufman ([id number]) Visit Report for 03/20/2022 Allergy List Details Patient Name: Date of Service: Jocelyn Kaufman, Jocelyn Kaufman 03/20/2022 8:30 A M Medical Record Number: [id number] Patient Account Number: [id number] Date of Birth/Sex: Treating RN: Feb 03, 1956 (66 y.o. F) Primary Care Jocelyn Kaufman: Jocelyn Kaufman Other Clinician: Referring Jocelyn Kaufman: Treating Jocelyn Kaufman/Extender: Jocelyn Kaufman in Treatment: 0 Allergies Active Allergies Ozempic Reaction: Cataracts/impaired vision Severity: Severe Active: 09/25/2021 Jardiance Reaction: Itching,swelling Active: 07/31/2019 latex Active: 02/15/2020 tramadol Active: 06/20/2021 Invokana Reaction: Urinary Incontinence Severity: Mild Active: 03/07/2017 Percocet Reaction: Nausea and Vomiting Severity: Mild Active: 04/30/2013 Prilosec Reaction: Ras Severity: Mild Active: 04/30/2013 Rogaine Reaction: Rash Severity: Mild Active: 04/30/2013 Victoza Reaction: Itching Severity: Mild Active: 04/30/2013 Allergy Notes Electronic Signature(s) Signed: 03/20/2022 4:06:07 PM By: Jocelyn Hammock RN Previous Signature: 03/19/2022 11:20:43 AM Version By: Jocelyn Kaufman Jocelyn Kaufman Jocelyn Kaufman , , Entered By: Jocelyn Kaufman on 03/20/2022 08:43:09 -------------------------------------------------------------------------------- Arrival Information Details Patient Name: Date of Service: Jocelyn Kaufman. 03/20/2022 8:30 A M Medical Record Number: [id number] Patient Account Number: [id number] Date of Birth/Sex: Treating RN: [birth year] (66 y.o. Tonita Phoenix, Jocelyn Kaufman Primary Care Jocelyn Kaufman: Jocelyn Kaufman Other Clinician: Referring Jocelyn Kaufman: Treating Jocelyn Kaufman/Extender: Jocelyn Kaufman in Treatment: 0 Visit Information Patient Arrived: Ambulatory Arrival Time: 08:38 Accompanied By: self Transfer Assistance: None Patient Identification Verified: Yes Secondary Verification Process Completed: Yes Patient Has Alerts: Yes Patient  Alerts: *Prefers left arm for BP Electronic Signature(s) Signed: 03/20/2022 4:06:07 PM By: Jocelyn Hammock RN Entered By: Jocelyn Kaufman on 03/20/2022 08:40:21 -------------------------------------------------------------------------------- Clinic Level of Care Assessment Details Patient Name: Date of Service: Jocelyn Kaufman 03/20/2022 8:30 A M Medical Record Number: [id number] Patient Account Number: [id number] Date of Birth/Sex: Treating RN: 01-04-1956 (66 y.o. Tonita Phoenix, Jocelyn Kaufman Primary Care Jocelyn Kaufman: Jocelyn Kaufman Other Clinician: Referring Jocelyn Kaufman: Treating Jocelyn Kaufman/Extender: Jocelyn Kaufman in Treatment: 0 Clinic Level of Care Assessment Items TOOL 2 Quantity Score X- 1 0 Use when only an EandM is performed on the INITIAL visit ASSESSMENTS - Nursing Assessment / Reassessment X- 1 20 General Physical Exam (combine w/ comprehensive assessment (listed just below) when performed on new pt. evals) X- 1 25 Comprehensive Assessment (HX, ROS, Risk Assessments, Wounds Hx, etc.) ASSESSMENTS - Wound and Skin A ssessment / Reassessment X - Simple Wound Assessment / Reassessment - one wound 1 5 '[]'$  - 0 Complex Wound Assessment / Reassessment - multiple wounds '[]'$  - 0 Dermatologic / Skin Assessment (not related to wound area) ASSESSMENTS - Ostomy and/or Continence Assessment and Care '[]'$  - 0 Incontinence Assessment and Management '[]'$  - 0 Ostomy Care Assessment and Management (repouching, etc.) PROCESS - Coordination of Care X - Simple Patient / Family Education for ongoing care 1 15 '[]'$  - 0 Complex (extensive) Patient / Family Education for ongoing care X- 1 10 Staff obtains Programmer, systems, Records, T Results / Process Orders est '[]'$  - 0 Staff telephones HHA, Nursing Homes / Clarify orders / etc '[]'$  - 0 Routine Transfer to another Facility (non-emergent condition) '[]'$  - 0 Routine Hospital Admission (non-emergent condition) X- 1 15 New Admissions / Medical laboratory scientific officer / Ordering NPWT Apligraf, etc. , '[]'$  - 0 Emergency Hospital Admission (emergent condition) X- 1 10 Simple Discharge Coordination '[]'$  - 0 Complex (extensive) Discharge Coordination PROCESS - Special Needs '[]'$  - 0 Pediatric / Minor Patient Management '[]'$  - 0 Isolation Patient Management '[]'$  - 0 Hearing / Language / Visual special needs '[]'$  - 0 Assessment of Community assistance (transportation, D/C planning, etc.) '[]'$  - 0 Additional  assistance / Altered mentation '[]'$  - 0 Support Surface(s) Assessment (bed, cushion, seat, etc.) INTERVENTIONS - Wound Cleansing / Measurement X- 1 5 Wound Imaging (photographs - any number of wounds) '[]'$  - 0 Wound Tracing (instead of photographs) X- 1 5 Simple Wound Measurement - one wound '[]'$  - 0 Complex Wound Measurement - multiple wounds X- 1 5 Simple Wound Cleansing - one wound '[]'$  - 0 Complex Wound Cleansing - multiple wounds INTERVENTIONS - Wound Dressings X - Small Wound Dressing one or multiple wounds 1 10 '[]'$  - 0 Medium Wound Dressing one or multiple wounds '[]'$  - 0 Large Wound Dressing one or multiple wounds '[]'$  - 0 Application of Medications - injection INTERVENTIONS - Miscellaneous '[]'$  - 0 External ear exam '[]'$  - 0 Specimen Collection (cultures, biopsies, blood, body fluids, etc.) '[]'$  - 0 Specimen(s) / Culture(s) sent or taken to Lab for analysis '[]'$  - 0 Patient Transfer (multiple staff / Civil Service fast streamer / Similar devices) '[]'$  - 0 Simple Staple / Suture removal (25 or less) '[]'$  - 0 Complex Staple / Suture removal (26 or more) '[]'$  - 0 Hypo / Hyperglycemic Management (close monitor of Blood Glucose) X- 1 15 Ankle / Brachial Index (ABI) - do not check if billed separately Has the patient been seen at the hospital within the last three years: Yes Total Score: 140 Level Of Care: New/Established - Level 4 Electronic Signature(s) Signed: 03/20/2022 4:06:07 PM By: Jocelyn Hammock RN Entered By: Jocelyn Kaufman on 03/20/2022  09:47:51 -------------------------------------------------------------------------------- Encounter Discharge Information Details Patient Name: Date of Service: Jocelyn Kaufman. 03/20/2022 8:30 A M Medical Record Number: [id number] Patient Account Number: [id number] Date of Birth/Sex: Treating RN: Sep 01, 1956 (66 y.o. Tonita Phoenix, Jocelyn Kaufman Primary Care Shamona Wirtz: Jocelyn Kaufman Other Clinician: Referring Emmabelle Fear: Treating Jesenya Bowditch/Extender: Jocelyn Kaufman in Treatment: 0 Encounter Discharge Information Items Post Procedure Vitals Discharge Condition: Stable Temperature (F): 98.7 Ambulatory Status: Ambulatory Pulse (bpm): 78 Discharge Destination: Home Respiratory Rate (breaths/min): 17 Transportation: Private Auto Blood Pressure (mmHg): 131/86 Accompanied By: SELf Schedule Follow-up Appointment: Yes Clinical Summary of Care: Patient Declined Electronic Signature(s) Signed: 03/20/2022 4:06:07 PM By: Jocelyn Hammock RN Entered By: Jocelyn Kaufman on 03/20/2022 09:48:34 -------------------------------------------------------------------------------- Lower Extremity Assessment Details Patient Name: Date of Service: Jocelyn Kaufman 03/20/2022 8:30 A M Medical Record Number: [id number] Patient Account Number: [id number] Date of Birth/Sex: Treating RN: 1956-01-08 (66 y.o. Debby Bud Primary Care Zamarian Scarano: Jocelyn Kaufman Other Clinician: Referring Majestic Molony: Treating Stewart Sasaki/Extender: Jocelyn Kaufman in Treatment: 0 Edema Assessment Assessed: Shirlyn Goltz: Yes] Patrice Paradise: No] Edema: [Left: Ye] [Right: s] Calf Left: Right: Point of Measurement: 30 cm From Medial Instep 42 cm Ankle Left: Right: Point of Measurement: 7 cm From Medial Instep 23.5 cm Knee To Floor Left: Right: From Medial Instep 40 cm Vascular Assessment Pulses: Dorsalis Pedis Palpable: [Left:Yes] Posterior Tibial Palpable: [Left:Yes] Blood Pressure: Brachial:  [Left:138] Ankle: [Left:Dorsalis Pedis: 190 1.38] Electronic Signature(s) Signed: 03/20/2022 4:06:07 PM By: Jocelyn Hammock RN Signed: 03/20/2022 4:34:51 PM By: Deon Pilling RN, BSN Entered By: Jocelyn Kaufman on 03/20/2022 09:24:03 -------------------------------------------------------------------------------- Multi Wound Chart Details Patient Name: Date of Service: Jocelyn Kaufman. 03/20/2022 8:30 A M Medical Record Number: [id number] Patient Account Number: [id number] Date of Birth/Sex: Treating RN: 26-Apr-1956 (66 y.o. F) Primary Care Stan Cantave: Jocelyn Kaufman Other Clinician: Referring Haneefah Venturini: Treating Imagene Boss/Extender: Jocelyn Kaufman in Treatment: 0 Vital Signs Height(in): 64 Capillary Blood Glucose(mg/dl): 131 Weight(lbs): 237 Pulse(bpm): 65 Body Mass Index(BMI): 40.7 Blood Pressure(mmHg): 138/62 Temperature(F): 99  Respiratory Rate(breaths/min): 17 Photos: [N/A:N/A] Left, Posterior Lower Leg N/A N/A Wound Location: Gradually Appeared N/A N/A Wounding Event: Open Surgical Wound N/A N/A Primary Etiology: 01/26/2022 N/A N/A Date A cquired: 0 N/A N/A Weeks of Treatment: Open N/A N/A Wound Status: No N/A N/A Wound Recurrence: 2.5x3x0.3 N/A N/A Measurements L x W x D (cm) 5.89 N/A N/A A (cm) : rea 1.767 N/A N/A Volume (cm) : Unclassifiable N/A N/A Classification: Medium N/A N/A Exudate A mount: Serosanguineous N/A N/A Exudate Type: red, brown N/A N/A Exudate Color: Distinct, outline attached N/A N/A Wound Margin: None Present (0%) N/A N/A Granulation A mount: Large (67-100%) N/A N/A Necrotic A mount: Eschar N/A N/A Necrotic Tissue: None N/A N/A Epithelialization: Debridement - Selective/Open Wound N/A N/A Debridement: 09:30 N/A N/A Pre-procedure Verification/Time Out Taken: Lidocaine N/A N/A Pain Control: Necrotic/Eschar N/A N/A Tissue Debrided: Skin/Epidermis N/A N/A Level: 7.5 N/A N/A Debridement A (sq  cm): rea Curette N/A N/A Instrument: Minimum N/A N/A Bleeding: Pressure N/A N/A Hemostasis A chieved: 0 N/A N/A Procedural Pain: 0 N/A N/A Post Procedural Pain: Procedure was tolerated well N/A N/A Debridement Treatment Response: 2.5x3x0.3 N/A N/A Post Debridement Measurements L x W x D (cm) 1.767 N/A N/A Post Debridement Volume: (cm) Debridement N/A N/A Procedures Performed: Treatment Notes Wound #1 (Lower Leg) Wound Laterality: Left, Posterior Cleanser Soap and Water Discharge Instruction: May shower and wash wound with dial antibacterial soap and water prior to dressing change. Wound Cleanser Discharge Instruction: Cleanse the wound with wound cleanser prior to applying a clean dressing using gauze sponges, not tissue or cotton balls. Peri-Wound Care Topical Primary Dressing Dakin's Solution 0.25%, 16 (oz) Discharge Instruction: Moisten gauze with Dakin's solution Secondary Dressing Bordered Gauze, 4x4 in Discharge Instruction: Apply over primary dressing as directed. Woven Gauze Sponge, Non-Sterile 4x4 in Discharge Instruction: Apply over primary dressing as directed. Secured With Compression Wrap Compression Stockings Environmental education officer) Signed: 03/20/2022 12:39:58 PM By: Kalman Shan DO Entered By: Kalman Shan on 03/20/2022 11:26:15 -------------------------------------------------------------------------------- Multi-Disciplinary Care Plan Details Patient Name: Date of Service: LILYAUNA, MIEDEMA 03/20/2022 8:30 A M Medical Record Number: [id number] Patient Account Number: [id number] Date of Birth/Sex: Treating RN: 08/03/1956 (66 y.o. Tonita Phoenix, Jocelyn Kaufman Primary Care Deitrick Ferreri: Jocelyn Kaufman Other Clinician: Referring Khyrin Trevathan: Treating Tamikia Chowning/Extender: Jocelyn Kaufman in Treatment: 0 Active Inactive Orientation to the Wound Care Program Nursing Diagnoses: Knowledge deficit related to the wound healing center  program Goals: Patient/caregiver will verbalize understanding of the [facility] Date Initiated: 03/20/2022 Target Resolution Date: 04/06/2022 Goal Status: Active Interventions: Provide education on orientation to the wound center Notes: Wound/Skin Impairment Nursing Diagnoses: Impaired tissue integrity Knowledge deficit related to ulceration/compromised skin integrity Goals: Patient will have a decrease in wound volume by X% from date: (specify in notes) Date Initiated: 03/20/2022 Target Resolution Date: 04/06/2022 Goal Status: Active Patient/caregiver will verbalize understanding of skin care regimen Date Initiated: 03/20/2022 Target Resolution Date: 04/05/2022 Goal Status: Active Ulcer/skin breakdown will have a volume reduction of 30% by week 4 Date Initiated: 03/20/2022 Target Resolution Date: 04/04/2022 Goal Status: Active Interventions: Assess patient/caregiver ability to obtain necessary supplies Assess patient/caregiver ability to perform ulcer/skin care regimen upon admission and as needed Assess ulceration(s) every visit Notes: Electronic Signature(s) Signed: 03/20/2022 4:06:07 PM By: Jocelyn Hammock RN Entered By: Jocelyn Kaufman on 03/20/2022 09:25:01 -------------------------------------------------------------------------------- Pain Assessment Details Patient Name: Date of Service: Jocelyn Kaufman 03/20/2022 8:30 A M Medical Record Number: [id number] Patient Account Number: [id number] Date of Birth/Sex:  Treating RN: 06-Jan-1956 (66 y.o. Tonita Phoenix, Jocelyn Kaufman Primary Care Glorimar Stroope: Jocelyn Kaufman Other Clinician: Referring Kalev Temme: Treating Yuvan Medinger/Extender: Jocelyn Kaufman in Treatment: 0 Active Problems Location of Pain Severity and Description of Pain Patient Has Paino Yes Site Locations Pain Location: Pain in Ulcers With Dressing Change: Yes Duration of the Pain. Constant / Intermittento Intermittent Rate the  pain. Current Pain Level: 6 Worst Pain Level: 10 Least Pain Level: 0 Tolerable Pain Level: 6 Character of Pain Describe the Pain: Aching Pain Management and Medication Current Pain Management: Medication: No Cold Application: No Rest: No Massage: No Activity: No T.E.N.S.: No Heat Application: No Leg drop or elevation: No Is the Current Pain Management Adequate: Adequate How does your wound impact your activities of daily livingo Sleep: No Bathing: No Appetite: No Relationship With Others: No Bladder Continence: No Emotions: No Bowel Continence: No Work: No Toileting: No Drive: No Dressing: No Hobbies: No Electronic Signature(s) Signed: 03/20/2022 4:06:07 PM By: Jocelyn Hammock RN Entered By: Jocelyn Kaufman on 03/20/2022 08:42:07 -------------------------------------------------------------------------------- Patient/Caregiver Education Details Patient Name: Date of Service: Jocelyn Kaufman 5/23/2023andnbsp8:30 [place] Record Number: [id number] Patient Account Number: [id number] Date of Birth/Gender: Treating RN: 1955-12-02 (66 y.o. Tonita Phoenix, Jocelyn Kaufman Primary Care Physician: Jocelyn Kaufman Other Clinician: Referring Physician: Treating Physician/Extender: Jocelyn Kaufman in Treatment: 0 Education Assessment Education Provided To: Patient Education Topics Provided Welcome T [facility]: o Methods: Explain/Verbal Responses: Reinforcements needed, State content correctly Electronic Signature(s) Signed: 03/20/2022 4:06:07 PM By: Jocelyn Hammock RN Entered By: Jocelyn Kaufman on 03/20/2022 09:25:12 -------------------------------------------------------------------------------- Wound Assessment Details Patient Name: Date of Service: Jocelyn Kaufman 03/20/2022 8:30 A M Medical Record Number: [id number] Patient Account Number: [id number] Date of Birth/Sex: Treating RN: 16-Jan-1956 (66 y.o. Helene Shoe, Meta.Reding Primary Care  Montavis Schubring: Jocelyn Kaufman Other Clinician: Referring Kashlynn Kundert: Treating Landi Biscardi/Extender: Jocelyn Kaufman in Treatment: 0 Wound Status Wound Number: 1 Primary Etiology: Open Surgical Wound Wound Location: Left, Posterior Lower Leg Wound Status: Open Wounding Event: Gradually Appeared Date Acquired: 01/26/2022 Weeks Of Treatment: 0 Clustered Wound: No Photos Wound Measurements Length: (cm) 2.5 Width: (cm) 3 Depth: (cm) 0.3 Area: (cm) 5.89 Volume: (cm) 1.767 % Reduction in Area: % Reduction in Volume: Epithelialization: None Tunneling: No Undermining: No Wound Description Classification: Unclassifiable Wound Margin: Distinct, outline attached Exudate Amount: Medium Exudate Type: Serosanguineous Exudate Color: red, brown Foul Odor After Cleansing: No Slough/Fibrino Yes Wound Bed Granulation Amount: None Present (0%) Necrotic Amount: Large (67-100%) Necrotic Quality: Eschar Treatment Notes Wound #1 (Lower Leg) Wound Laterality: Left, Posterior Cleanser Soap and Water Discharge Instruction: May shower and wash wound with dial antibacterial soap and water prior to dressing change. Wound Cleanser Discharge Instruction: Cleanse the wound with wound cleanser prior to applying a clean dressing using gauze sponges, not tissue or cotton balls. Peri-Wound Care Topical Primary Dressing Dakin's Solution 0.25%, 16 (oz) Discharge Instruction: Moisten gauze with Dakin's solution Secondary Dressing Bordered Gauze, 4x4 in Discharge Instruction: Apply over primary dressing as directed. Woven Gauze Sponge, Non-Sterile 4x4 in Discharge Instruction: Apply over primary dressing as directed. Secured With Compression Wrap Compression Stockings Environmental education officer) Signed: 03/20/2022 4:34:51 PM By: Deon Pilling RN, BSN Entered By: Deon Pilling on 03/20/2022  08:54:34 -------------------------------------------------------------------------------- Vitals Details Patient Name: Date of Service: Tyrone Nine REN O. 03/20/2022 8:30 A M Medical Record Number: [id number] Patient Account Number: [id number] Date of Birth/Sex: Treating RN: 02-Aug-1956 (66 y.o. Tonita Phoenix, Jocelyn Kaufman Primary Care Jeidy Hoerner: Jocelyn Kaufman Other  Clinician: Referring Kerstyn Coryell: Treating Kennedee Kitzmiller/Extender: Jocelyn Kaufman in Treatment: 0 Vital Signs Time Taken: 08:40 Temperature (F): 99 Height (in): 64 Pulse (bpm): 65 Source: Stated Respiratory Rate (breaths/min): 17 Weight (lbs): 237 Blood Pressure (mmHg): 138/62 Source: Stated Capillary Blood Glucose (mg/dl): 131 Body Mass Index (BMI): 40.7 Reference Range: 80 - 120 mg / dl Electronic Signature(s) Signed: 03/20/2022 4:06:07 PM By: Jocelyn Hammock RN Entered By: Jocelyn Kaufman on 03/20/2022 08:41:17

## 2022-03-20 ENCOUNTER — Encounter (HOSPITAL_BASED_OUTPATIENT_CLINIC_OR_DEPARTMENT_OTHER): Payer: Medicare PPO | Attending: Internal Medicine | Admitting: Internal Medicine

## 2022-03-20 DIAGNOSIS — L97822 Non-pressure chronic ulcer of other part of left lower leg with fat layer exposed: Secondary | ICD-10-CM | POA: Diagnosis not present

## 2022-03-20 DIAGNOSIS — E039 Hypothyroidism, unspecified: Secondary | ICD-10-CM | POA: Diagnosis not present

## 2022-03-20 DIAGNOSIS — T8331XA Breakdown (mechanical) of intrauterine contraceptive device, initial encounter: Secondary | ICD-10-CM | POA: Diagnosis not present

## 2022-03-20 DIAGNOSIS — E11622 Type 2 diabetes mellitus with other skin ulcer: Secondary | ICD-10-CM | POA: Diagnosis not present

## 2022-03-20 DIAGNOSIS — M069 Rheumatoid arthritis, unspecified: Secondary | ICD-10-CM | POA: Insufficient documentation

## 2022-03-20 DIAGNOSIS — T8131XA Disruption of external operation (surgical) wound, not elsewhere classified, initial encounter: Secondary | ICD-10-CM

## 2022-03-20 DIAGNOSIS — X58XXXA Exposure to other specified factors, initial encounter: Secondary | ICD-10-CM | POA: Diagnosis not present

## 2022-03-20 NOTE — Progress Notes (Signed)
Jocelyn, Kaufman ([id number]) Visit Report for 03/20/2022 Chief Complaint Document Details Patient Name: Date of Service: Jocelyn Kaufman, Jocelyn Kaufman 03/20/2022 8:30 A M Medical Record Number: [id number] Patient Account Number: [id number] Date of Birth/Sex: Treating RN: 06-18-1956 (66 y.o. F) Primary Care Provider: Deland Pretty Other Clinician: Referring Provider: Treating Provider/Extender: Judie Grieve in Treatment: 0 Information Obtained from: Patient Chief Complaint 03/20/2022; left posterior wound status post punch biopsy in March 2023 Electronic Signature(s) Signed: 03/20/2022 12:39:58 PM By: Kalman Shan DO Entered By: Kalman Shan on 03/20/2022 11:27:10 -------------------------------------------------------------------------------- Debridement Details Patient Name: Date of Service: Jocelyn Kaufman 03/20/2022 8:30 A M Medical Record Number: [id number] Patient Account Number: [id number] Date of Birth/Sex: Treating RN: 1956/06/26 (66 y.o. Jocelyn Kaufman, Jocelyn Kaufman Primary Care Provider: Deland Pretty Other Clinician: Referring Provider: Treating Provider/Extender: Judie Grieve in Treatment: 0 Debridement Performed for Assessment: Wound #1 Left,Posterior Lower Leg Performed By: Physician Kalman Shan, DO Debridement Type: Debridement Level of Consciousness (Pre-procedure): Awake and Alert Pre-procedure Verification/Time Out Yes - 09:30 Taken: Start Time: 09:30 Pain Control: Lidocaine T Area Debrided (L x W): otal 2.5 (cm) x 3 (cm) = 7.5 (cm) Tissue and other material debrided: Viable, Non-Viable, Eschar, Skin: Dermis , Skin: Epidermis Level: Skin/Epidermis Debridement Description: Selective/Open Wound Instrument: Curette Bleeding: Minimum Hemostasis Achieved: Pressure End Time: 09:30 Procedural Pain: 0 Post Procedural Pain: 0 Response to Treatment: Procedure was tolerated well Level of Consciousness (Post- Awake and  Alert procedure): Post Debridement Measurements of Total Wound Length: (cm) 2.5 Width: (cm) 3 Depth: (cm) 0.3 Volume: (cm) 1.767 Character of Wound/Ulcer Post Debridement: Improved Post Procedure Diagnosis Same as Pre-procedure Electronic Signature(s) Signed: 03/20/2022 12:39:58 PM By: Kalman Shan DO Signed: 03/20/2022 4:06:07 PM By: Rhae Hammock RN Entered By: Rhae Hammock on 03/20/2022 09:32:29 -------------------------------------------------------------------------------- HPI Details Patient Name: Date of Service: Jocelyn Kaufman. 03/20/2022 8:30 A M Medical Record Number: [id number] Patient Account Number: [id number] Date of Birth/Sex: Treating RN: [birth year] (66 y.o. F) Primary Care Provider: Deland Pretty Other Clinician: Referring Provider: Treating Provider/Extender: Judie Grieve in Treatment: 0 History of Present Illness HPI Description: 03/20/2022 [name] is a 66 year old female with a past medical history of rheumatoid arthritis, hypothyroidism, Type 2 diabetes on injectable medication that presents to the clinic for a wound to the posterior left leg. She states that on March 31 she had a punch biopsy done by her dermatologist for a suspicious lesion. The biopsy results were reported as lentigo. She states she has follow-up with her dermatologist in 6 months. The punch biopsy site has never closed. She states at one point it was well-healing however appears to have gotten infected. She has been on 2 rounds of doxycycline. She is currently keeping the area covered. She still reports tenderness to the wound site. She denies drainage. Electronic Signature(s) Signed: 03/20/2022 12:39:58 PM By: Kalman Shan DO Entered By: Kalman Shan on 03/20/2022 11:29:53 -------------------------------------------------------------------------------- Physical Exam Details Patient Name: Date of Service: Jocelyn Kaufman, Jocelyn Kaufman 03/20/2022 8:30 A  M Medical Record Number: [id number] Patient Account Number: [id number] Date of Birth/Sex: Treating RN: 02-Oct-1956 (65 y.o. F) Primary Care Provider: Deland Pretty Other Clinician: Referring Provider: Treating Provider/Extender: Judie Grieve in Treatment: 0 Constitutional respirations regular, non-labored and within target range for patient.. Cardiovascular 2+ dorsalis pedis/posterior tibialis pulses. Psychiatric pleasant and cooperative. Notes Left lower extremity: T the posterior aspect there is a previous punch biopsy site with necrotic tissue throughout. T the surrounding  skin there is another area o o of necrotic tissue. Postdebridement there is granulation tissue but mostly nonviable tissue. Electronic Signature(s) Signed: 03/20/2022 12:39:58 PM By: Kalman Shan DO Entered By: Kalman Shan on 03/20/2022 11:30:34 -------------------------------------------------------------------------------- Physician Orders Details Patient Name: Date of Service: Jocelyn Kaufman, Jocelyn Kaufman 03/20/2022 8:30 A M Medical Record Number: [id number] Patient Account Number: [id number] Date of Birth/Sex: Treating RN: 01-06-1956 (66 y.o. Jocelyn Kaufman, Jocelyn Kaufman Primary Care Provider: Deland Pretty Other Clinician: Referring Provider: Treating Provider/Extender: Judie Grieve in Treatment: 0 Verbal / Phone Orders: No Diagnosis Coding ICD-10 Coding Code Description [phone] Non-pressure chronic ulcer of other part of left lower leg with fat layer exposed T81.31XA Disruption of external operation (surgical) wound, not elsewhere classified, initial encounter E11.622 Type 2 diabetes mellitus with other skin ulcer M06.9 Rheumatoid arthritis, unspecified Follow-up Appointments ppointment in 1 week. - Dr. Heber Coleman and [facility] # 9 Return A Bathing/ Shower/ Hygiene May shower with protection but do not get wound dressing(s) wet. Edema Control - Lymphedema / SCD  / Other Bilateral Lower Extremities Elevate legs to the level of the heart or above for 30 minutes daily and/or when sitting, a frequency of: Avoid standing for long periods of time. Wound Treatment Wound #1 - Lower Leg Wound Laterality: Left, Posterior Cleanser: Soap and Water 2 x Per DZH/29 Days Discharge Instructions: May shower and wash wound with dial antibacterial soap and water prior to dressing change. Cleanser: Wound Cleanser (DME) (Generic) 2 x Per Day/15 Days Discharge Instructions: Cleanse the wound with wound cleanser prior to applying a clean dressing using gauze sponges, not tissue or cotton balls. Prim Dressing: Dakin's Solution 0.25%, 16 (oz) 2 x Per Day/15 Days ary Discharge Instructions: Moisten gauze with Dakin's solution Secondary Dressing: Bordered Gauze, 4x4 in (DME) (Generic) 2 x Per Day/15 Days Discharge Instructions: Apply over primary dressing as directed. Secondary Dressing: Woven Gauze Sponge, Non-Sterile 4x4 in (DME) (Generic) 2 x Per Day/15 Days Discharge Instructions: Apply over primary dressing as directed. Patient Medications llergies: Ozempic, Invokana, Percocet, Prilosec, Rogaine, Victoza, Jardiance, latex, tramadol A Notifications Medication Indication Start End 03/20/2022 Keflex DOSE 1 - oral 500 mg capsule - 1 capsule oral q6h x 7 days 03/20/2022 Dakin's Solution DOSE 1 - miscellaneous 0.25 % solution - apply with gauze for wet to dry dressings Electronic Signature(s) Signed: 03/20/2022 12:39:58 PM By: Kalman Shan DO Previous Signature: 03/20/2022 9:48:33 AM Version By: Kalman Shan DO Entered By: Kalman Shan on 03/20/2022 11:30:44 -------------------------------------------------------------------------------- Problem List Details Patient Name: Date of Service: Jocelyn Kaufman. 03/20/2022 8:30 A M Medical Record Number: [id number] Patient Account Number: [id number] Date of Birth/Sex: Treating RN: [birth year] (66 y.o. 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/23/2023 No Yes T81.31XA Disruption of external operation (surgical) wound, not elsewhere classified, 03/20/2022 No Yes initial encounter E11.622 Type 2 diabetes mellitus with other skin ulcer 03/20/2022 No Yes M06.9 Rheumatoid arthritis, unspecified 03/20/2022 No Yes Inactive Problems Resolved Problems Electronic Signature(s) Signed: 03/20/2022 12:39:58 PM By: Kalman Shan DO Entered By: Kalman Shan on 03/20/2022 11:26:08 -------------------------------------------------------------------------------- Progress Note Details Patient Name: Date of Service: Jocelyn Kaufman 03/20/2022 8:30 A M Medical Record Number: [id number] Patient Account Number: [id number] Date of Birth/Sex: Treating RN: [birth year] (66 y.o. F) Primary Care Provider: Deland Pretty  Other Clinician: Referring Provider: Treating Provider/Extender: Judie Grieve in Treatment: 0 Subjective Chief Complaint Information obtained from Patient 03/20/2022; left posterior wound status post punch biopsy in March 2023 History of Present Illness (HPI) 03/20/2022 [name] is a 66 year old female with a past medical history of rheumatoid arthritis, hypothyroidism, Type 2 diabetes on injectable medication that presents to the clinic for a wound to the posterior left leg. She states that on March 31 she had a punch biopsy done by her dermatologist for a suspicious lesion. The biopsy results were reported as lentigo. She states she has follow-up with her dermatologist in 6 months. The punch biopsy site has never closed. She states at one point it was well-healing however appears to have gotten  infected. She has been on 2 rounds of doxycycline. She is currently keeping the area covered. She still reports tenderness to the wound site. She denies drainage. Patient History Information obtained from Patient. Allergies Ozempic (Severity: Severe, Reaction: Cataracts/impaired vision), Jardiance (Reaction: Itching,swelling), latex, tramadol, Invokana (Severity: Mild, Reaction: Urinary Incontinence), Percocet (Severity: Mild, Reaction: Nausea and Vomiting), Prilosec (Severity: Mild, Reaction: Ras), Rogaine (Severity: Mild, Reaction: Rash), Victoza (Severity: Mild, Reaction: Itching) Family History Cancer - Siblings,Father, Diabetes - Siblings, Heart Disease - Mother,Father, Hypertension - Mother, Kidney Disease - Mother,Siblings, Lung Disease - Father, Seizures - Siblings, Stroke - Mother,Father, Thyroid Problems - Siblings, No family history of Tuberculosis. Social History Never smoker, Marital Status - Single, Alcohol Use - Never, Drug Use - No History, Caffeine Use - Daily. Medical History Hematologic/Lymphatic Patient has history of Anemia Respiratory Patient has history of Asthma Endocrine Patient has history of Type II Diabetes Musculoskeletal Patient has history of Rheumatoid Arthritis Patient is treated with Insulin. Blood sugar is tested. Medical A Surgical History Notes nd Cardiovascular Extra beats Review of Systems (ROS) Eyes Complains or has symptoms of Vision Changes. Ear/Nose/Mouth/Throat Denies complaints or symptoms of Chronic sinus problems or rhinitis. Gastrointestinal Denies complaints or symptoms of Frequent diarrhea, Nausea, Vomiting. Genitourinary Denies complaints or symptoms of Frequent urination. Integumentary (Skin) Complains or has symptoms of Wounds - LLE. Neurologic Complains or has symptoms of Numbness/parasthesias - Right foot. Psychiatric Denies complaints or symptoms of Claustrophobia, Suicidal. Objective Constitutional respirations  regular, non-labored and within target range for patient.. Vitals Time Taken: 8:40 AM, Height: 64 in, Source: Stated, Weight: 237 lbs, Source: Stated, BMI: 40.7, Temperature: 99 F, Pulse: 65 bpm, Respiratory Rate: 17 breaths/min, Blood Pressure: 138/62 mmHg, Capillary Blood Glucose: 131 mg/dl. Cardiovascular 2+ dorsalis pedis/posterior tibialis pulses. Psychiatric pleasant and cooperative. General Notes: Left lower extremity: T the posterior aspect there is a previous punch biopsy site with necrotic tissue throughout. T the surrounding skin there o o is another area of necrotic tissue. Postdebridement there is granulation tissue but mostly nonviable tissue. Integumentary (Hair, Skin) Wound #1 status is Open. Original cause of wound was Gradually Appeared. The date acquired was: 01/26/2022. The wound is located on the Left,Posterior Lower Leg. The wound measures 2.5cm length x 3cm width x 0.3cm depth; 5.89cm^2 area and 1.767cm^3 volume. There is no tunneling or undermining noted. There is a medium amount of serosanguineous drainage noted. The wound margin is distinct with the outline attached to the wound base. There is no granulation within the wound bed. There is a large (67-100%) amount of necrotic tissue within the wound bed including Eschar. Assessment Active Problems ICD-10 Non-pressure chronic ulcer of other part of left lower leg with fat layer exposed Disruption of external operation (surgical) wound,  not elsewhere classified, initial encounter Type 2 diabetes mellitus with other skin ulcer Rheumatoid arthritis, unspecified Patient presents with a nonhealing ulcer to her left lower extremity for the past 2 months following a punch biopsy. Results were a benign lentigo. I debrided necrotic debris. Since she is having tenderness on exam to the wound bed I recommended broader coverage with Keflex in addition to the doxycycline she is currently taking. For now I recommended Dakin's  wet-to-dry dressing to help further address bioburden and help with debridement of the necrotic debris. Her ABIs were noncompressible however she has strong pedal pulses and should have adequate blood flow for healing. If wound worsens we will order ABIs with TBI's. Follow-up in 1 week. 49 minutes was spent on the encounter including face-to-face, EMR review and coordination of care. Procedures Wound #1 Pre-procedure diagnosis of Wound #1 is an Open Surgical Wound located on the Left,Posterior Lower Leg . There was a Selective/Open Wound Skin/Epidermis Debridement with a total area of 7.5 sq cm performed by Kalman Shan, DO. With the following instrument(s): Curette to remove Viable and Non-Viable tissue/material. Material removed includes Eschar, Skin: Dermis, and Skin: Epidermis after achieving pain control using Lidocaine. No specimens were taken. A time out was conducted at 09:30, prior to the start of the procedure. A Minimum amount of bleeding was controlled with Pressure. The procedure was tolerated well with a pain level of 0 throughout and a pain level of 0 following the procedure. Post Debridement Measurements: 2.5cm length x 3cm width x 0.3cm depth; 1.767cm^3 volume. Character of Wound/Ulcer Post Debridement is improved. Post procedure Diagnosis Wound #1: Same as Pre-Procedure Plan Follow-up Appointments: Return Appointment in 1 week. - Dr. Heber [place] and Allayne Butcher Room # 9 Bathing/ Shower/ Hygiene: May shower with protection but do not get wound dressing(s) wet. Edema Control - Lymphedema / SCD / Other: Elevate legs to the level of the heart or above for 30 minutes daily and/or when sitting, a frequency of: Avoid standing for long periods of time. The following medication(s) was prescribed: Keflex oral 500 mg capsule 1 1 capsule oral q6h x 7 days starting 03/20/2022 Dakin's Solution miscellaneous 0.25 % solution 1 apply with gauze for wet to dry dressings starting 03/20/2022 WOUND #1:  - Lower Leg Wound Laterality: Left, Posterior Cleanser: Soap and Water 2 x Per Day/15 Days Discharge Instructions: May shower and wash wound with dial antibacterial soap and water prior to dressing change. Cleanser: Wound Cleanser (DME) (Generic) 2 x Per Day/15 Days Discharge Instructions: Cleanse the wound with wound cleanser prior to applying a clean dressing using gauze sponges, not tissue or cotton balls. Prim Dressing: Dakin's Solution 0.25%, 16 (oz) 2 x Per Day/15 Days ary Discharge Instructions: Moisten gauze with Dakin's solution Secondary Dressing: Bordered Gauze, 4x4 in (DME) (Generic) 2 x Per Day/15 Days Discharge Instructions: Apply over primary dressing as directed. Secondary Dressing: Woven Gauze Sponge, Non-Sterile 4x4 in (DME) (Generic) 2 x Per Day/15 Days Discharge Instructions: Apply over primary dressing as directed. 1. In office sharp debridement 2. Keflex 3. Dakin's wet-to-dry dressings 4. Follow-up in 1 week Electronic Signature(s) Signed: 03/20/2022 12:39:58 PM By: Kalman Shan DO Entered By: Kalman Shan on 03/20/2022 11:35:50 -------------------------------------------------------------------------------- HxROS Details Patient Name: Date of Service: Jocelyn Kaufman. 03/20/2022 8:30 A M Medical Record Number: [id number] Patient Account Number: [id number] Date of Birth/Sex: Treating RN: 01/11/1956 (66 y.o. Jocelyn Kaufman, Jocelyn Kaufman Primary Care Provider: Deland Pretty Other Clinician: Referring Provider: Treating Provider/Extender: Judie Grieve in Treatment:  0 Information Obtained From Patient Eyes Complaints and Symptoms: Positive for: Vision Changes Ear/Nose/Mouth/Throat Complaints and Symptoms: Negative for: Chronic sinus problems or rhinitis Gastrointestinal Complaints and Symptoms: Negative for: Frequent diarrhea; Nausea; Vomiting Genitourinary Complaints and Symptoms: Negative for: Frequent urination Integumentary  (Skin) Complaints and Symptoms: Positive for: Wounds - LLE Neurologic Complaints and Symptoms: Positive for: Numbness/parasthesias - Right foot Psychiatric Complaints and Symptoms: Negative for: Claustrophobia; Suicidal Hematologic/Lymphatic Medical History: Positive for: Anemia Respiratory Medical History: Positive for: Asthma Cardiovascular Medical History: Past Medical History Notes: Extra beats Endocrine Medical History: Positive for: Type II Diabetes Time with diabetes: 20 years Treated with: Insulin Blood sugar tested every day: Yes Tested : once a day Musculoskeletal Medical History: Positive for: Rheumatoid Arthritis Oncologic Immunizations Pneumococcal Vaccine: Received Pneumococcal Vaccination: Yes Received Pneumococcal Vaccination On or After 60th Birthday: Yes Implantable Devices None Family and Social History Cancer: Yes - Siblings,Father; Diabetes: Yes - Siblings; Heart Disease: Yes - Mother,Father; Hypertension: Yes - Mother; Kidney Disease: Yes - Mother,Siblings; Lung Disease: Yes - Father; Seizures: Yes - Siblings; Stroke: Yes - Mother,Father; Thyroid Problems: Yes - Siblings; Tuberculosis: No; Never smoker; Marital Status - Single; Alcohol Use: Never; Drug Use: No History; Caffeine Use: Daily; Financial Concerns: No; Food, Clothing or Shelter Needs: No; Support System Lacking: Yes; Transportation Concerns: No Electronic Signature(s) Signed: 03/20/2022 12:39:58 PM By: Kalman Shan DO Signed: 03/20/2022 4:06:07 PM By: Rhae Hammock RN Entered By: Rhae Hammock on 03/20/2022 08:55:14 -------------------------------------------------------------------------------- SuperBill Details Patient Name: Date of Service: ADEL, NEYER 03/20/2022 Medical Record Number: [id number] Patient Account Number: [id number] Date of Birth/Sex: Treating RN: 08/20/1956 (66 y.o. Jocelyn Kaufman, Jocelyn Kaufman Primary Care Provider: Deland Pretty Other Clinician: Referring  Provider: Treating Provider/Extender: Judie Grieve in Treatment: 0 Diagnosis Coding ICD-10 Codes Code Description [phone] Non-pressure chronic ulcer of other part of left lower leg with fat layer exposed T81.31XA Disruption of external operation (surgical) wound, not elsewhere classified, initial encounter E11.622 Type 2 diabetes mellitus with other skin ulcer M06.9 Rheumatoid arthritis, unspecified Facility Procedures CPT4 Code: [id number] Description: 17001 - WOUND CARE VISIT-LEV 4 NEW PT Modifier: Quantity: 1 CPT4 Code: [id number] Description: 59163 - DEBRIDE WOUND 1ST 20 SQ CM OR < ICD-10 Diagnosis Description L97.822 Non-pressure chronic ulcer of other part of left lower leg with fat layer exposed Modifier: Quantity: 1 Physician Procedures : CPT4 Code Description Modifier [id number] - WC PHYS LEVEL 4 - NEW PT ICD-10 Diagnosis Description L97.822 Non-pressure chronic ulcer of other part of left lower leg with fat layer exposed T81.31XA Disruption of external operation (surgical) wound,  not elsewhere classified, initial encounter E11.622 Type 2 diabetes mellitus with other skin ulcer M06.9 Rheumatoid arthritis, unspecified Quantity: 1 Electronic Signature(s) Signed: 03/20/2022 12:39:58 PM By: Kalman Shan DO Entered By: Kalman Shan on 03/20/2022 11:36:14

## 2022-03-20 NOTE — Progress Notes (Signed)
CIELA, MAHAJAN ([id number]) Visit Report for 03/20/2022 Abuse Risk Screen Details Patient Name: Date of Service: Jocelyn Kaufman, Jocelyn Kaufman 03/20/2022 8:30 A M Medical Record Number: [id number] Patient Account Number: [id number] Date of Birth/Sex: Treating RN: February 23, 1956 (66 y.o. Tonita Phoenix, Lauren Primary Care Christain Mcraney: Deland Pretty Other Clinician: Referring Jolee Critcher: Treating Kamarie Palma/Extender: Judie Grieve in Treatment: 0 Abuse Risk Screen Items Answer ABUSE RISK SCREEN: Has anyone close to you tried to hurt or harm you recentlyo No Do you feel uncomfortable with anyone in your familyo No Has anyone forced you do things that you didnt want to doo No Electronic Signature(s) Signed: 03/20/2022 4:06:07 PM By: Rhae Hammock RN Entered By: Rhae Hammock on 03/20/2022 08:55:41 -------------------------------------------------------------------------------- Activities of Daily Living Details Patient Name: Date of Service: Jocelyn Kaufman, Jocelyn Kaufman 03/20/2022 8:30 A M Medical Record Number: [id number] Patient Account Number: [id number] Date of Birth/Sex: Treating RN: 03/07/1956 (66 y.o. Tonita Phoenix, Lauren Primary Care Basilia Stuckert: Deland Pretty Other Clinician: Referring Holley Kocurek: Treating Shanvi Moyd/Extender: Judie Grieve in Treatment: 0 Activities of Daily Living Items Answer Activities of Daily Living (Please select one for each item) Drive Automobile Completely Able T Medications ake Completely Able Use T elephone Completely Able Care for Appearance Completely Able Use T oilet Completely Able Bath / Shower Completely Able Dress Self Completely Able Feed Self Completely Able Walk Completely Able Get In / Out Bed Completely Able Housework Completely Able Prepare Meals Completely [place] Completely Able Shop for Self Completely Able Electronic Signature(s) Signed: 03/20/2022 4:06:07 PM By: Rhae Hammock RN Entered By:  Rhae Hammock on 03/20/2022 08:56:11 -------------------------------------------------------------------------------- Education Screening Details Patient Name: Date of Service: Jocelyn Kaufman 03/20/2022 8:30 A M Medical Record Number: [id number] Patient Account Number: [id number] Date of Birth/Sex: Treating RN: 08/21/1956 (66 y.o. Tonita Phoenix, Lauren Primary Care Laquashia Mergenthaler: Deland Pretty Other Clinician: Referring Maxx Calaway: Treating Tymber Stallings/Extender: Judie Grieve in Treatment: 0 Primary Learner Assessed: Patient Learning Preferences/Education Level/Primary Language Learning Preference: Explanation, Demonstration, Printed Material Highest Education Level: College or Above Preferred Language: English Cognitive Barrier Language Barrier: No Translator Needed: No Memory Deficit: No Emotional Barrier: No Cultural/Religious Beliefs Affecting Medical Care: No Physical Barrier Impaired Vision: Yes Glasses Impaired Hearing: No Decreased Hand dexterity: No Knowledge/Comprehension Knowledge Level: High Comprehension Level: High Ability to understand written instructions: High Ability to understand verbal instructions: High Motivation Anxiety Level: Anxious Cooperation: Cooperative Education Importance: Acknowledges Need Interest in Health Problems: Asks Questions Perception: Coherent Willingness to Engage in Self-Management High Activities: Readiness to Engage in Self-Management High Activities: Electronic Signature(s) Signed: 03/20/2022 4:06:07 PM By: Rhae Hammock RN Entered By: Rhae Hammock on 03/20/2022 08:57:06 -------------------------------------------------------------------------------- Fall Risk Assessment Details Patient Name: Date of Service: Jocelyn Rife O. 03/20/2022 8:30 A M Medical Record Number: [id number] Patient Account Number: [id number] Date of Birth/Sex: Treating RN: [birth year] (66 y.o. Tonita Phoenix, Lauren Primary  Care Nayda Riesen: Deland Pretty Other Clinician: Referring Anakin Varkey: Treating Raffi Milstein/Extender: Judie Grieve in Treatment: 0 Fall Risk Assessment Items Have you had 2 or more falls in the last 12 monthso 0 No Have you had any fall that resulted in injury in the last 12 monthso 0 No FALLS RISK SCREEN History of falling - immediate or within 3 months 0 No Secondary diagnosis (Do you have 2 or more medical diagnoseso) 0 No Ambulatory aid None/bed rest/wheelchair/nurse 0 No Crutches/cane/walker 0 No Furniture 0 No Intravenous therapy Access/Saline/Heparin Lock 0 No Gait/Transferring Normal/ bed rest/ wheelchair 0 No Weak (  short steps with or without shuffle, stooped but able to lift head while walking, may seek 0 No support from furniture) Impaired (short steps with shuffle, may have difficulty arising from chair, head down, impaired 0 No balance) Mental Status Oriented to own ability 0 No Electronic Signature(s) Signed: 03/20/2022 4:06:07 PM By: Rhae Hammock RN Entered By: Rhae Hammock on 03/20/2022 08:57:25 -------------------------------------------------------------------------------- Foot Assessment Details Patient Name: Date of Service: Jocelyn Kaufman. 03/20/2022 8:30 A M Medical Record Number: [id number] Patient Account Number: [id number] Date of Birth/Sex: Treating RN: 17-Dec-1955 (66 y.o. Tonita Phoenix, Lauren Primary Care Shauntell Iglesia: Deland Pretty Other Clinician: Referring Tashiya Souders: Treating Javen Hinderliter/Extender: Judie Grieve in Treatment: 0 Foot Assessment Items Site Locations + = Sensation present, - = Sensation absent, C = Callus, U = Ulcer R = Redness, W = Warmth, M = Maceration, PU = Pre-ulcerative lesion F = Fissure, S = Swelling, D = Dryness Assessment Right: Left: Other Deformity: No No Prior Foot Ulcer: No No Prior Amputation: No No Charcot Joint: No No Ambulatory Status: Ambulatory Without Help Gait:  Steady Electronic Signature(s) Signed: 03/20/2022 4:06:07 PM By: Rhae Hammock RN Entered By: Rhae Hammock on 03/20/2022 09:23:18 -------------------------------------------------------------------------------- Nutrition Risk Screening Details Patient Name: Date of Service: Jocelyn Kaufman, Jocelyn Kaufman 03/20/2022 8:30 A M Medical Record Number: [id number] Patient Account Number: [id number] Date of Birth/Sex: Treating RN: 07-07-1956 (66 y.o. Tonita Phoenix, Lauren Primary Care Laurann Mcmorris: Deland Pretty Other Clinician: Referring Kimorah Ridolfi: Treating Neville Pauls/Extender: Judie Grieve in Treatment: 0 Height (in): 64 Weight (lbs): 237 Body Mass Index (BMI): 40.7 Nutrition Risk Screening Items Score Screening NUTRITION RISK SCREEN: I have an illness or condition that made me change the kind and/or amount of food I eat 0 No I eat fewer than two meals per day 0 No I eat few fruits and vegetables, or milk products 2 Yes I have three or more drinks of beer, liquor or wine almost every day 0 No I have tooth or mouth problems that make it hard for me to eat 0 No I don't always have enough money to buy the food I need 0 No I eat alone most of the time 1 Yes I take three or more different prescribed or over-the-counter drugs a day 1 Yes Without wanting to, I have lost or gained 10 pounds in the last six months 0 No I am not always physically able to shop, cook and/or feed myself 0 No Nutrition Protocols Good Risk Protocol Provide education on elevated blood Moderate Risk Protocol 0 sugars and impact on wound healing, as applicable High Risk Proctocol Risk Level: Moderate Risk Score: 4 Electronic Signature(s) Signed: 03/20/2022 4:06:07 PM By: Rhae Hammock RN Entered By: Rhae Hammock on 03/20/2022 08:59:17

## 2022-03-21 DIAGNOSIS — E119 Type 2 diabetes mellitus without complications: Secondary | ICD-10-CM | POA: Diagnosis not present

## 2022-03-21 DIAGNOSIS — H524 Presbyopia: Secondary | ICD-10-CM | POA: Diagnosis not present

## 2022-03-21 DIAGNOSIS — H401131 Primary open-angle glaucoma, bilateral, mild stage: Secondary | ICD-10-CM | POA: Diagnosis not present

## 2022-03-21 DIAGNOSIS — H26493 Other secondary cataract, bilateral: Secondary | ICD-10-CM | POA: Diagnosis not present

## 2022-03-27 ENCOUNTER — Encounter (HOSPITAL_BASED_OUTPATIENT_CLINIC_OR_DEPARTMENT_OTHER): Payer: Medicare PPO | Admitting: Internal Medicine

## 2022-03-27 DIAGNOSIS — M069 Rheumatoid arthritis, unspecified: Secondary | ICD-10-CM

## 2022-03-27 DIAGNOSIS — T8131XA Disruption of external operation (surgical) wound, not elsewhere classified, initial encounter: Secondary | ICD-10-CM

## 2022-03-27 DIAGNOSIS — E039 Hypothyroidism, unspecified: Secondary | ICD-10-CM | POA: Diagnosis not present

## 2022-03-27 DIAGNOSIS — E11622 Type 2 diabetes mellitus with other skin ulcer: Secondary | ICD-10-CM | POA: Diagnosis not present

## 2022-03-27 DIAGNOSIS — L97822 Non-pressure chronic ulcer of other part of left lower leg with fat layer exposed: Secondary | ICD-10-CM | POA: Diagnosis not present

## 2022-03-27 DIAGNOSIS — T8331XA Breakdown (mechanical) of intrauterine contraceptive device, initial encounter: Secondary | ICD-10-CM | POA: Diagnosis not present

## 2022-03-28 ENCOUNTER — Encounter: Payer: Self-pay | Admitting: *Deleted

## 2022-03-28 NOTE — Progress Notes (Signed)
Jocelyn Kaufman, Jocelyn Kaufman ([id number]) Visit Report for 03/27/2022 Chief Complaint Document Details Patient Name: Date of Service: Jocelyn, Kaufman 03/27/2022 8:30 A M Medical Record Number: [id number] Patient Account Number: [id number] Date of Birth/Sex: Treating RN: March 31, 1956 (66 y.o. Tonita Phoenix, Lauren Primary Care Provider: Deland Pretty Other Clinician: Referring Provider: Treating Provider/Extender: Judie Grieve in Treatment: 1 Information Obtained from: Patient Chief Complaint 03/20/2022; left posterior wound status post punch biopsy in March 2023 Electronic Signature(s) Signed: 03/27/2022 10:01:03 AM By: Kalman Shan DO Entered By: Kalman Shan on 03/27/2022 09:55:24 -------------------------------------------------------------------------------- Debridement Details Patient Name: Date of Service: Jocelyn Kaufman 03/27/2022 8:30 A M Medical Record Number: [id number] Patient Account Number: [id number] Date of Birth/Sex: Treating RN: Aug 12, 1956 (66 y.o. Tonita Phoenix, Lauren Primary Care Provider: Deland Pretty Other Clinician: Referring Provider: Treating Provider/Extender: Judie Grieve in Treatment: 1 Debridement Performed for Assessment: Wound #1 Left,Posterior Lower Leg Performed By: Physician Kalman Shan, DO Debridement Type: Debridement Level of Consciousness (Pre-procedure): Awake and Alert Pre-procedure Verification/Time Out Yes - 09:20 Taken: Start Time: 09:20 Pain Control: Lidocaine T Area Debrided (L x W): otal 2.1 (cm) x 2.3 (cm) = 4.83 (cm) Tissue and other material debrided: Viable, Non-Viable, Slough, Subcutaneous, Slough Level: Skin/Subcutaneous Tissue Debridement Description: Excisional Instrument: Curette Bleeding: Minimum Hemostasis Achieved: Pressure End Time: 09:20 Procedural Pain: 0 Post Procedural Pain: 0 Response to Treatment: Procedure was tolerated well Level of Consciousness (Post- Awake and  Alert procedure): Post Debridement Measurements of Total Wound Length: (cm) 2.1 Width: (cm) 2.3 Depth: (cm) 0.3 Volume: (cm) 1.138 Character of Wound/Ulcer Post Debridement: Improved Post Procedure Diagnosis Same as Pre-procedure Electronic Signature(s) Signed: 03/27/2022 10:01:03 AM By: Kalman Shan DO Signed: 03/28/2022 3:17:18 PM By: Rhae Hammock RN Entered By: Rhae Hammock on 03/27/2022 09:20:57 -------------------------------------------------------------------------------- HPI Details Patient Name: Date of Service: Jocelyn Kaufman. 03/27/2022 8:30 A M Medical Record Number: [id number] Patient Account Number: [id number] Date of Birth/Sex: Treating RN: 1956/07/01 (66 y.o. Tonita Phoenix, Lauren Primary Care Provider: Deland Pretty Other Clinician: Referring Provider: Treating Provider/Extender: Judie Grieve in Treatment: 1 History of Present Illness HPI Description: 03/20/2022 Ms. Yukiko Minnich is a 66 year old female with a past medical history of rheumatoid arthritis, hypothyroidism, Type 2 diabetes on injectable medication that presents to the clinic for a wound to the posterior left leg. She states that on March 31 she had a punch biopsy done by her dermatologist for a suspicious lesion. The biopsy results were reported as lentigo. She states she has follow-up with her dermatologist in 6 months. The punch biopsy site has never closed. She states at one point it was well-healing however appears to have gotten infected. She has been on 2 rounds of doxycycline. She is currently keeping the area covered. She still reports tenderness to the wound site. She denies drainage. 5/30; patient presents for follow-up. She is almost done with her Keflex prescribed at last clinic visit. She has been using Dakin's wet-to-dry dressings to the wound bed. There is improvement in chronic pain to the wound site. She denies signs of infection. Electronic  Signature(s) Signed: 03/27/2022 10:01:03 AM By: Kalman Shan DO Entered By: Kalman Shan on 03/27/2022 09:57:00 -------------------------------------------------------------------------------- Physical Exam Details Patient Name: Date of Service: Jocelyn, Kaufman 03/27/2022 8:30 A M Medical Record Number: [id number] Patient Account Number: [id number] Date of Birth/Sex: Treating RN: 06-Feb-1956 (66 y.o. Tonita Phoenix, Lauren Primary Care Provider: Deland Pretty Other Clinician: Referring Provider: Treating Provider/Extender: Hardie Pulley  Weeks in Treatment: 1 Constitutional respirations regular, non-labored and within target range for patient.. Cardiovascular 2+ dorsalis pedis/posterior tibialis pulses. Psychiatric pleasant and cooperative. Notes Left lower extremity: T the posterior aspect there is a previous punch biopsy site with granulation tissue and nonviable tissue. T the surrounding skin there is o o a wound with nonviable tissue throughout the surface. No surrounding signs of infection. Electronic Signature(s) Signed: 03/27/2022 10:01:03 AM By: Kalman Shan DO Signed: 03/27/2022 10:01:03 AM By: Kalman Shan DO Entered By: Kalman Shan on 03/27/2022 09:57:55 -------------------------------------------------------------------------------- Physician Orders Details Patient Name: Date of Service: Jocelyn, Kaufman 03/27/2022 8:30 A M Medical Record Number: [id number] Patient Account Number: [id number] Date of Birth/Sex: Treating RN: 1956/08/24 (66 y.o. Tonita Phoenix, Lauren Primary Care Provider: Deland Pretty Other Clinician: Referring Provider: Treating Provider/Extender: Judie Grieve in Treatment: 1 Verbal / Phone Orders: No Diagnosis Coding Follow-up Appointments ppointment in 2 weeks. - Dr. Heber [place] and Allayne Butcher Room # 9 Return A Bathing/ Shower/ Hygiene May shower with protection but do not get wound dressing(s)  wet. Edema Control - Lymphedema / SCD / Other Bilateral Lower Extremities Elevate legs to the level of the heart or above for 30 minutes daily and/or when sitting, a frequency of: Avoid standing for long periods of time. Wound Treatment Wound #1 - Lower Leg Wound Laterality: Left, Posterior Cleanser: Soap and Water 2 x Per VXB/93 Days Discharge Instructions: May shower and wash wound with dial antibacterial soap and water prior to dressing change. Cleanser: Wound Cleanser (Generic) 2 x Per Day/15 Days Discharge Instructions: Cleanse the wound with Dakin's cleaner. Prim Dressing: MediHoney Gel, tube 1.5 (oz) 2 x Per Day/15 Days ary Discharge Instructions: Apply to wound bed as instructed Secondary Dressing: Bordered Gauze, 4x4 in (Generic) 2 x Per Day/15 Days Discharge Instructions: Apply over primary dressing as directed. Secondary Dressing: Woven Gauze Sponge, Non-Sterile 4x4 in (Generic) 2 x Per Day/15 Days Discharge Instructions: Apply over primary dressing as directed. Electronic Signature(s) Signed: 03/27/2022 10:01:03 AM By: Kalman Shan DO Entered By: Kalman Shan on 03/27/2022 09:58:04 -------------------------------------------------------------------------------- Problem List Details Patient Name: Date of Service: LAURABETH, YIP 03/27/2022 8:30 A M Medical Record Number: [id number] Patient Account Number: [id number] Date of Birth/Sex: Treating RN: 05-29-1956 (66 y.o. 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/23/2023 No Yes T81.31XA Disruption of external operation (surgical) wound, not elsewhere classified, 03/20/2022 No Yes initial encounter E11.622 Type 2  diabetes mellitus with other skin ulcer 03/20/2022 No Yes M06.9 Rheumatoid arthritis, unspecified 03/20/2022 No Yes Inactive Problems Resolved Problems Electronic Signature(s) Signed: 03/27/2022 10:01:03 AM By: Kalman Shan DO Entered By: Kalman Shan on 03/27/2022 09:55:02 -------------------------------------------------------------------------------- Progress Note Details Patient Name: Date of Service: Jocelyn Kaufman 03/27/2022 8:30 A M Medical Record Number: [id number] Patient Account Number: [id number] Date of Birth/Sex: Treating RN: [birth year] (66 y.o. Tonita Phoenix, Lauren Primary Care Provider: Deland Pretty Other Clinician: Referring Provider: Treating Provider/Extender: Judie Grieve in Treatment: 1 Subjective Chief Complaint Information obtained from Patient 03/20/2022; left posterior wound status post punch biopsy in March 2023 History of Present Illness (HPI) 03/20/2022 Ms. Lajada Janes is a 66 year old female with a past medical history of rheumatoid arthritis, hypothyroidism, Type 2 diabetes on injectable medication that presents to the clinic  for a wound to the posterior left leg. She states that on March 31 she had a punch biopsy done by her dermatologist for a suspicious lesion. The biopsy results were reported as lentigo. She states she has follow-up with her dermatologist in 6 months. The punch biopsy site has never closed. She states at one point it was well-healing however appears to have gotten infected. She has been on 2 rounds of doxycycline. She is currently keeping the area covered. She still reports tenderness to the wound site. She denies drainage. 5/30; patient presents for follow-up. She is almost done with her Keflex prescribed at last clinic visit. She has been using Dakin's wet-to-dry dressings to the wound bed. There is improvement in chronic pain to the wound site. She denies signs of infection. Patient History Information  obtained from Patient. Family History Cancer - Siblings,Father, Diabetes - Siblings, Heart Disease - Mother,Father, Hypertension - Mother, Kidney Disease - Mother,Siblings, Lung Disease - Father, Seizures - Siblings, Stroke - Mother,Father, Thyroid Problems - Siblings, No family history of Tuberculosis. Social History Never smoker, Marital Status - Single, Alcohol Use - Never, Drug Use - No History, Caffeine Use - Daily. Medical History Hematologic/Lymphatic Patient has history of Anemia Respiratory Patient has history of Asthma Endocrine Patient has history of Type II Diabetes Musculoskeletal Patient has history of Rheumatoid Arthritis Medical A Surgical History Notes nd Cardiovascular Extra beats Objective Constitutional respirations regular, non-labored and within target range for patient.. Vitals Time Taken: 8:51 AM, Height: 64 in, Weight: 237 lbs, BMI: 40.7, Temperature: 98.7 F, Pulse: 69 bpm, Respiratory Rate: 17 breaths/min, Blood Pressure: 125/69 mmHg, Capillary Blood Glucose: 120 mg/dl. Cardiovascular 2+ dorsalis pedis/posterior tibialis pulses. Psychiatric pleasant and cooperative. General Notes: Left lower extremity: T the posterior aspect there is a previous punch biopsy site with granulation tissue and nonviable tissue. T the o o surrounding skin there is a wound with nonviable tissue throughout the surface. No surrounding signs of infection. Integumentary (Hair, Skin) Wound #1 status is Open. Original cause of wound was Gradually Appeared. The date acquired was: 01/26/2022. The wound has been in treatment 1 weeks. The wound is located on the Left,Posterior Lower Leg. The wound measures 2.1cm length x 2.3cm width x 0.3cm depth; 3.793cm^2 area and 1.138cm^3 volume. There is no tunneling or undermining noted. There is a medium amount of serosanguineous drainage noted. The wound margin is distinct with the outline attached to the wound base. There is no granulation  within the wound bed. There is a large (67-100%) amount of necrotic tissue within the wound bed including Eschar. Assessment Active Problems ICD-10 Non-pressure chronic ulcer of other part of left lower leg with fat layer exposed Disruption of external operation (surgical) wound, not elsewhere classified, initial encounter Type 2 diabetes mellitus with other skin ulcer Rheumatoid arthritis, unspecified Patient's wound has shown improvement in size and appearance since last clinic visit. There is more granulation tissue present today. I debrided nonviable tissue. At this time I recommended switching the dressing from Dakin's to Medihoney to help with further debridement. No signs of surrounding infection. I offered patient 1 week follow-up however she would like to follow-up in 2 weeks. Procedures Wound #1 Pre-procedure diagnosis of Wound #1 is an Open Surgical Wound located on the Left,Posterior Lower Leg . There was a Excisional Skin/Subcutaneous Tissue Debridement with a total area of 4.83 sq cm performed by Kalman Shan, DO. With the following instrument(s): Curette to remove Viable and Non-Viable tissue/material. Material removed includes Subcutaneous Tissue and [facility]  and after achieving pain control using Lidocaine. No specimens were taken. A time out was conducted at 09:20, prior to the start of the procedure. A Minimum amount of bleeding was controlled with Pressure. The procedure was tolerated well with a pain level of 0 throughout and a pain level of 0 following the procedure. Post Debridement Measurements: 2.1cm length x 2.3cm width x 0.3cm depth; 1.138cm^3 volume. Character of Wound/Ulcer Post Debridement is improved. Post procedure Diagnosis Wound #1: Same as Pre-Procedure Plan Follow-up Appointments: Return Appointment in 2 weeks. - Dr. Heber [place] and Allayne Butcher Room # 9 Bathing/ Shower/ Hygiene: May shower with protection but do not get wound dressing(s) wet. Edema Control -  Lymphedema / SCD / Other: Elevate legs to the level of the heart or above for 30 minutes daily and/or when sitting, a frequency of: Avoid standing for long periods of time. WOUND #1: - Lower Leg Wound Laterality: Left, Posterior Cleanser: Soap and Water 2 x Per ZOX/09 Days Discharge Instructions: May shower and wash wound with dial antibacterial soap and water prior to dressing change. Cleanser: Wound Cleanser (Generic) 2 x Per Day/15 Days Discharge Instructions: Cleanse the wound with Dakin's cleaner. Prim Dressing: MediHoney Gel, tube 1.5 (oz) 2 x Per Day/15 Days ary Discharge Instructions: Apply to wound bed as instructed Secondary Dressing: Bordered Gauze, 4x4 in (Generic) 2 x Per Day/15 Days Discharge Instructions: Apply over primary dressing as directed. Secondary Dressing: Woven Gauze Sponge, Non-Sterile 4x4 in (Generic) 2 x Per Day/15 Days Discharge Instructions: Apply over primary dressing as directed. 1. In office sharp debridement 2. Medihoney 3. Follow-up in 2 weeks Electronic Signature(s) Signed: 03/27/2022 10:01:03 AM By: Kalman Shan DO Entered By: Kalman Shan on 03/27/2022 10:00:04 -------------------------------------------------------------------------------- HxROS Details Patient Name: Date of Service: Jocelyn Kaufman. 03/27/2022 8:30 A M Medical Record Number: [id number] Patient Account Number: [id number] Date of Birth/Sex: Treating RN: [birth year] (66 y.o. Tonita Phoenix, Lauren Primary Care Provider: Deland Pretty Other Clinician: Referring Provider: Treating Provider/Extender: Judie Grieve in Treatment: 1 Information Obtained From Patient Hematologic/Lymphatic Medical History: Positive for: Anemia Respiratory Medical History: Positive for: Asthma Cardiovascular Medical History: Past Medical History Notes: Extra beats Endocrine Medical History: Positive for: Type II Diabetes Time with diabetes: 20 years Treated with:  Insulin Blood sugar tested every day: Yes Tested : once a day Musculoskeletal Medical History: Positive for: Rheumatoid Arthritis Immunizations Pneumococcal Vaccine: Received Pneumococcal Vaccination: Yes Received Pneumococcal Vaccination On or After 60th Birthday: Yes Implantable Devices None Family and Social History Cancer: Yes - Siblings,Father; Diabetes: Yes - Siblings; Heart Disease: Yes - Mother,Father; Hypertension: Yes - Mother; Kidney Disease: Yes - Mother,Siblings; Lung Disease: Yes - Father; Seizures: Yes - Siblings; Stroke: Yes - Mother,Father; Thyroid Problems: Yes - Siblings; Tuberculosis: No; Never smoker; Marital Status - Single; Alcohol Use: Never; Drug Use: No History; Caffeine Use: Daily; Financial Concerns: No; Food, Clothing or Shelter Needs: No; Support System Lacking: Yes; Transportation Concerns: No Electronic Signature(s) Signed: 03/27/2022 10:01:03 AM By: Kalman Shan DO Signed: 03/28/2022 3:17:18 PM By: Rhae Hammock RN Entered By: Kalman Shan on 03/27/2022 09:57:07 -------------------------------------------------------------------------------- SuperBill Details Patient Name: Date of Service: DOTTY, GONZALO 03/27/2022 Medical Record Number: [id number] Patient Account Number: [id number] Date of Birth/Sex: Treating RN: 12/16/1955 (66 y.o. Tonita Phoenix, Lauren Primary Care Provider: Deland Pretty Other Clinician: Referring Provider: Treating Provider/Extender: Judie Grieve in Treatment: 1 Diagnosis Coding ICD-10 Codes Code Description [phone] Non-pressure chronic ulcer of other part of left lower leg with fat layer  exposed T81.31XA Disruption of external operation (surgical) wound, not elsewhere classified, initial encounter E11.622 Type 2 diabetes mellitus with other skin ulcer M06.9 Rheumatoid arthritis, unspecified Facility Procedures CPT4 Code: [id number] Description: 47340 - DEB SUBQ TISSUE 20 SQ CM/< ICD-10  Diagnosis Description L97.822 Non-pressure chronic ulcer of other part of left lower leg with fat layer expose Modifier: d Quantity: 1 Physician Procedures : CPT4 Code Description Modifier [id number] - WC PHYS LEVEL 2 - EST PT 25 ICD-10 Diagnosis Description L97.822 Non-pressure chronic ulcer of other part of left lower leg with fat layer exposed T81.31XA Disruption of external operation (surgical)  wound, not elsewhere classified, initial encounter E11.622 Type 2 diabetes mellitus with other skin ulcer M06.9 Rheumatoid arthritis, unspecified Quantity: 1 : [id number] 11042 - WC PHYS SUBQ TISS 20 SQ CM ICD-10 Diagnosis Description L97.822 Non-pressure chronic ulcer of other part of left lower leg with fat layer exposed Quantity: 1 Electronic Signature(s) Signed: 03/27/2022 12:32:00 PM By: Kalman Shan DO Previous Signature: 03/27/2022 10:53:40 AM Version By: Kalman Shan DO Entered By: Kalman Shan on 03/27/2022 12:23:33

## 2022-03-28 NOTE — Progress Notes (Signed)
ARSHI, DUARTE ([id number]) Visit Report for 03/27/2022 Arrival Information Details Patient Name: Date of Service: Jocelyn Kaufman, Jocelyn Kaufman 03/27/2022 8:30 A M Medical Record Number: [id number] Patient Account Number: [id number] Date of Birth/Sex: Treating RN: [birth year] (66 y.o. Tonita Phoenix, Lauren Primary Care Nicie Milan: Deland Pretty Other Clinician: Referring Kizzie Cotten: Treating Laquitta Dominski/Extender: Judie Grieve in Treatment: 1 Visit Information History Since Last Visit Added or deleted any medications: No Patient Arrived: Ambulatory Any new allergies or adverse reactions: No Arrival Time: 08:46 Had a fall or experienced change in No Accompanied By: self activities of daily living that may affect Transfer Assistance: None risk of falls: Patient Identification Verified: Yes Signs or symptoms of abuse/neglect since last visito No Secondary Verification Process Completed: Yes Hospitalized since last visit: No Patient Has Alerts: Yes Implantable device outside of the clinic excluding No Patient Alerts: *Prefers left arm for BP cellular tissue based products placed in the center since last visit: Has Dressing in Place as Prescribed: Yes Pain Present Now: No Electronic Signature(s) Signed: 03/28/2022 3:17:18 PM By: Rhae Hammock RN Entered By: Rhae Hammock on 03/27/2022 08:47:18 -------------------------------------------------------------------------------- Encounter Discharge Information Details Patient Name: Date of Service: Jocelyn Kaufman. 03/27/2022 8:30 A M Medical Record Number: [id number] Patient Account Number: [id number] Date of Birth/Sex: Treating RN: [birth year] (66 y.o. Tonita Phoenix, Lauren Primary Care Deloise Marchant: Deland Pretty Other Clinician: Referring Wiatt Mahabir: Treating Sam Overbeck/Extender: Judie Grieve in Treatment: 1 Encounter Discharge Information Items Post Procedure Vitals Discharge Condition: Stable Temperature  (F): 98.7 Ambulatory Status: Ambulatory Pulse (bpm): 69 Discharge Destination: Home Respiratory Rate (breaths/min): 17 Transportation: Private Auto Blood Pressure (mmHg): 125/69 Accompanied By: self Schedule Follow-up Appointment: Yes Clinical Summary of Care: Patient Declined Electronic Signature(s) Signed: 03/28/2022 3:17:18 PM By: Rhae Hammock RN Entered By: Rhae Hammock on 03/27/2022 09:22:51 -------------------------------------------------------------------------------- Lower Extremity Assessment Details Patient Name: Date of Service: Jocelyn Kaufman 03/27/2022 8:30 A M Medical Record Number: [id number] Patient Account Number: [id number] Date of Birth/Sex: Treating RN: Mar 14, 1956 (66 y.o. Tonita Phoenix, Lauren Primary Care Dakhari Zuver: Deland Pretty Other Clinician: Referring Saahir Prude: Treating Kahron Kauth/Extender: Judie Grieve in Treatment: 1 Edema Assessment Assessed: Shirlyn Goltz: Yes] Patrice Paradise: No] Edema: [Left: Ye] [Right: s] Calf Left: Right: Point of Measurement: 30 cm From Medial Instep 43.5 cm Ankle Left: Right: Point of Measurement: 7 cm From Medial Instep 23 cm Vascular Assessment Pulses: Dorsalis Pedis Palpable: [Left:Yes] Posterior Tibial Palpable: [Left:Yes] Electronic Signature(s) Signed: 03/28/2022 3:17:18 PM By: Rhae Hammock RN Entered By: Rhae Hammock on 03/27/2022 08:57:17 -------------------------------------------------------------------------------- Multi Wound Chart Details Patient Name: Date of Service: Jocelyn Kaufman 03/27/2022 8:30 A M Medical Record Number: [id number] Patient Account Number: [id number] Date of Birth/Sex: Treating RN: 1955-11-22 (66 y.o. Tonita Phoenix, Lauren Primary Care Ellyse Rotolo: Deland Pretty Other Clinician: Referring Rett Stehlik: Treating Johnthomas Lader/Extender: Judie Grieve in Treatment: 1 Vital Signs Height(in): 64 Capillary Blood Glucose(mg/dl):  120 Weight(lbs): 237 Pulse(bpm): 27 Body Mass Index(BMI): 40.7 Blood Pressure(mmHg): 125/69 Temperature(F): 98.7 Respiratory Rate(breaths/min): 17 Photos: [1:Left, Posterior Lower Leg] [N/A:N/A N/A] Wound Location: [1:Gradually Appeared] [N/A:N/A] Wounding Event: [1:Open Surgical Wound] [N/A:N/A] Primary Etiology: [1:Anemia, Asthma, Type II Diabetes,] [N/A:N/A] Comorbid History: [1:Rheumatoid Arthritis 01/26/2022] [N/A:N/A] Date Acquired: [1:1] [N/A:N/A] Weeks of Treatment: [1:Open] [N/A:N/A] Wound Status: [1:No] [N/A:N/A] Wound Recurrence: [1:2.1x2.3x0.3] [N/A:N/A] Measurements L x W x D (cm) [1:3.793] [N/A:N/A] A (cm) : rea [1:1.138] [N/A:N/A] Volume (cm) : [1:35.60%] [N/A:N/A] % Reduction in A [1:rea: 35.60%] [N/A:N/A] % Reduction in Volume: [1:Unclassifiable] [N/A:N/A] Classification: [1:Medium] [N/A:N/A]  Exudate A mount: [1:Serosanguineous] [N/A:N/A] Exudate Type: [1:red, brown] [N/A:N/A] Exudate Color: [1:Distinct, outline attached] [N/A:N/A] Wound Margin: [1:None Present (0%)] [N/A:N/A] Granulation A mount: [1:Large (67-100%)] [N/A:N/A] Necrotic A mount: [1:Eschar] [N/A:N/A] Necrotic Tissue: [1:None] [N/A:N/A] Epithelialization: [1:Debridement - Excisional] [N/A:N/A] Debridement: Pre-procedure Verification/Time Out 09:20 [N/A:N/A] Taken: [1:Lidocaine] [N/A:N/A] Pain Control: [1:Subcutaneous, Slough] [N/A:N/A] Tissue Debrided: [1:Skin/Subcutaneous Tissue] [N/A:N/A] Level: [1:4.83] [N/A:N/A] Debridement A (sq cm): [1:rea Curette] [N/A:N/A] Instrument: [1:Minimum] [N/A:N/A] Bleeding: [1:Pressure] [N/A:N/A] Hemostasis A chieved: [1:0] [N/A:N/A] Procedural Pain: [1:0] [N/A:N/A] Post Procedural Pain: [1:Procedure was tolerated well] [N/A:N/A] Debridement Treatment Response: [1:2.1x2.3x0.3] [N/A:N/A] Post Debridement Measurements L x W x D (cm) [1:1.138] [N/A:N/A] Post Debridement Volume: (cm) [1:Debridement] [N/A:N/A] Treatment Notes Wound #1 (Lower Leg) Wound  Laterality: Left, Posterior Cleanser Soap and Water Discharge Instruction: May shower and wash wound with dial antibacterial soap and water prior to dressing change. Wound Cleanser Discharge Instruction: Cleanse the wound with wound cleanser prior to applying a clean dressing using gauze sponges, not tissue or cotton balls. Peri-Wound Care Topical Primary Dressing Dakin's Solution 0.25%, 16 (oz) Discharge Instruction: Moisten gauze with Dakin's solution Secondary Dressing Bordered Gauze, 4x4 in Discharge Instruction: Apply over primary dressing as directed. Woven Gauze Sponge, Non-Sterile 4x4 in Discharge Instruction: Apply over primary dressing as directed. Secured With Compression Wrap Compression Stockings Add-Ons Electronic Signature(s) Signed: 03/27/2022 10:01:03 AM By: Kalman Shan DO Signed: 03/28/2022 3:17:18 PM By: Rhae Hammock RN Entered By: Kalman Shan on 03/27/2022 09:55:11 -------------------------------------------------------------------------------- Multi-Disciplinary Care Plan Details Patient Name: Date of Service: Jocelyn Kaufman, Jocelyn Kaufman 03/27/2022 8:30 A M Medical Record Number: [id number] Patient Account Number: [id number] Date of Birth/Sex: Treating RN: 1956-02-15 (66 y.o. Tonita Phoenix, Lauren Primary Care Rhealynn Myhre: Deland Pretty Other Clinician: Referring Deronda Christian: Treating Mareesa Gathright/Extender: Judie Grieve in Treatment: 1 Active Inactive Orientation to the Wound Care Program Nursing Diagnoses: Knowledge deficit related to the wound healing center program Goals: Patient/caregiver will verbalize understanding of the [place] Date Initiated: 03/20/2022 Target Resolution Date: 04/06/2022 Goal Status: Active Interventions: Provide education on orientation to the wound center Notes: Wound/Skin Impairment Nursing Diagnoses: Impaired tissue integrity Knowledge deficit related to ulceration/compromised skin  integrity Goals: Patient will have a decrease in wound volume by X% from date: (specify in notes) Date Initiated: 03/20/2022 Target Resolution Date: 04/06/2022 Goal Status: Active Patient/caregiver will verbalize understanding of skin care regimen Date Initiated: 03/20/2022 Target Resolution Date: 04/05/2022 Goal Status: Active Ulcer/skin breakdown will have a volume reduction of 30% by week 4 Date Initiated: 03/20/2022 Target Resolution Date: 04/04/2022 Goal Status: Active Interventions: Assess patient/caregiver ability to obtain necessary supplies Assess patient/caregiver ability to perform ulcer/skin care regimen upon admission and as needed Assess ulceration(s) every visit Notes: Electronic Signature(s) Signed: 03/28/2022 3:17:18 PM By: Rhae Hammock RN Entered By: Rhae Hammock on 03/27/2022 09:13:42 -------------------------------------------------------------------------------- Pain Assessment Details Patient Name: Date of Service: Jocelyn Kaufman 03/27/2022 8:30 A M Medical Record Number: [id number] Patient Account Number: [id number] Date of Birth/Sex: Treating RN: 21-Jun-1956 (66 y.o. Tonita Phoenix, Lauren Primary Care Emrik Erhard: Deland Pretty Other Clinician: Referring Chaquana Nichols: Treating Caylor Tallarico/Extender: Judie Grieve in Treatment: 1 Active Problems Location of Pain Severity and Description of Pain Patient Has Paino Yes Site Locations Pain Location: Generalized Pain, Pain in Ulcers With Dressing Change: Yes Duration of the Pain. Constant / Intermittento Intermittent Rate the pain. Current Pain Level: 4 Worst Pain Level: 10 Least Pain Level: 0 Tolerable Pain Level: 4 Character of Pain Describe the Pain: Aching Pain Management and Medication Current  Pain Management: Medication: No Cold Application: No Rest: No Massage: No Activity: No T.E.N.S.: No Heat Application: No Leg drop or elevation: No Is the Current Pain Management  Adequate: Adequate How does your wound impact your activities of daily livingo Sleep: No Bathing: No Appetite: No Relationship With Others: No Bladder Continence: No Emotions: No Bowel Continence: No Work: No Toileting: No Drive: No Dressing: No Hobbies: No Electronic Signature(s) Signed: 03/28/2022 3:17:18 PM By: Rhae Hammock RN Entered By: Rhae Hammock on 03/27/2022 08:52:14 -------------------------------------------------------------------------------- Patient/Caregiver Education Details Patient Name: Date of Service: Jocelyn Kaufman 5/30/2023andnbsp8:30 A M Medical Record Number: [id number] Patient Account Number: [id number] Date of Birth/Gender: Treating RN: [birth year] (66 y.o. Tonita Phoenix, Lauren Primary Care Physician: Deland Pretty Other Clinician: Referring Physician: Treating Physician/Extender: Judie Grieve in Treatment: 1 Education Assessment Education Provided To: Patient Education Topics Provided [place]: o Methods: Explain/Verbal Responses: Reinforcements needed, State content correctly Electronic Signature(s) Signed: 03/28/2022 3:17:18 PM By: Rhae Hammock RN Entered By: Rhae Hammock on 03/27/2022 09:05:40 -------------------------------------------------------------------------------- Wound Assessment Details Patient Name: Date of Service: Jocelyn Kaufman, Jocelyn Kaufman 03/27/2022 8:30 A M Medical Record Number: [id number] Patient Account Number: [id number] Date of Birth/Sex: Treating RN: 1955/12/23 (67 y.o. Tonita Phoenix, Lauren Primary Care Alistair Senft: Deland Pretty Other Clinician: Referring Spenser Cong: Treating Fey Coghill/Extender: Judie Grieve in Treatment: 1 Wound Status Wound Number: 1 Primary Etiology: Open Surgical Wound Wound Location: Left, Posterior Lower Leg Wound Status: Open Wounding Event: Gradually Appeared Comorbid History: Anemia, Asthma, Type II  Diabetes, Rheumatoid Arthritis Date Acquired: 01/26/2022 Weeks Of Treatment: 1 Clustered Wound: No Photos Wound Measurements Length: (cm) 2.1 Width: (cm) 2.3 Depth: (cm) 0.3 Area: (cm) 3.793 Volume: (cm) 1.138 % Reduction in Area: 35.6% % Reduction in Volume: 35.6% Epithelialization: None Tunneling: No Undermining: No Wound Description Classification: Unclassifiable Wound Margin: Distinct, outline attached Exudate Amount: Medium Exudate Type: Serosanguineous Exudate Color: red, brown Foul Odor After Cleansing: No Slough/Fibrino Yes Wound Bed Granulation Amount: None Present (0%) Necrotic Amount: Large (67-100%) Necrotic Quality: Eschar Treatment Notes Wound #1 (Lower Leg) Wound Laterality: Left, Posterior Cleanser Soap and Water Discharge Instruction: May shower and wash wound with dial antibacterial soap and water prior to dressing change. Wound Cleanser Discharge Instruction: Cleanse the wound with wound cleanser prior to applying a clean dressing using gauze sponges, not tissue or cotton balls. Peri-Wound Care Topical Primary Dressing Dakin's Solution 0.25%, 16 (oz) Discharge Instruction: Moisten gauze with Dakin's solution Secondary Dressing Bordered Gauze, 4x4 in Discharge Instruction: Apply over primary dressing as directed. Woven Gauze Sponge, Non-Sterile 4x4 in Discharge Instruction: Apply over primary dressing as directed. Secured With Compression Wrap Compression Stockings Environmental education officer) Signed: 03/28/2022 3:17:18 PM By: Rhae Hammock RN Entered By: Rhae Hammock on 03/27/2022 09:03:46 -------------------------------------------------------------------------------- Vitals Details Patient Name: Date of Service: Jocelyn Kaufman. 03/27/2022 8:30 A M Medical Record Number: [id number] Patient Account Number: [id number] Date of Birth/Sex: Treating RN: 05/06/1956 (66 y.o. Tonita Phoenix, Lauren Primary Care Bhavesh Vazquez: Deland Pretty Other Clinician: Referring Keshaun Dubey: Treating Saydee Zolman/Extender: Judie Grieve in Treatment: 1 Vital Signs Time Taken: 08:51 Temperature (F): 98.7 Height (in): 64 Pulse (bpm): 69 Weight (lbs): 237 Respiratory Rate (breaths/min): 17 Body Mass Index (BMI): 40.7 Blood Pressure (mmHg): 125/69 Capillary Blood Glucose (mg/dl): 120 Reference Range: 80 - 120 mg / dl Electronic Signature(s) Signed: 03/28/2022 3:17:18 PM By: Rhae Hammock RN Entered By: Rhae Hammock on 03/27/2022 08:51:46

## 2022-03-29 ENCOUNTER — Inpatient Hospital Stay: Payer: Medicare PPO

## 2022-03-29 ENCOUNTER — Ambulatory Visit (INDEPENDENT_AMBULATORY_CARE_PROVIDER_SITE_OTHER): Payer: Medicare PPO | Admitting: Neurology

## 2022-03-29 ENCOUNTER — Inpatient Hospital Stay: Payer: Medicare PPO | Attending: Hematology and Oncology

## 2022-03-29 ENCOUNTER — Encounter: Payer: Self-pay | Admitting: Neurology

## 2022-03-29 ENCOUNTER — Other Ambulatory Visit: Payer: Self-pay

## 2022-03-29 VITALS — BP 129/66 | HR 68 | Ht 64.5 in | Wt 245.0 lb

## 2022-03-29 DIAGNOSIS — N183 Chronic kidney disease, stage 3 unspecified: Secondary | ICD-10-CM | POA: Insufficient documentation

## 2022-03-29 DIAGNOSIS — G4733 Obstructive sleep apnea (adult) (pediatric): Secondary | ICD-10-CM | POA: Diagnosis not present

## 2022-03-29 DIAGNOSIS — Z9989 Dependence on other enabling machines and devices: Secondary | ICD-10-CM | POA: Diagnosis not present

## 2022-03-29 DIAGNOSIS — D631 Anemia in chronic kidney disease: Secondary | ICD-10-CM | POA: Diagnosis not present

## 2022-03-29 LAB — CBC WITH DIFFERENTIAL (CANCER CENTER ONLY)
Abs Immature Granulocytes: 0.01 10*3/uL (ref 0.00–0.07)
Basophils Absolute: 0 10*3/uL (ref 0.0–0.1)
Basophils Relative: 1 %
Eosinophils Absolute: 0.2 10*3/uL (ref 0.0–0.5)
Eosinophils Relative: 2 %
HCT: 34.8 % — ABNORMAL LOW (ref 36.0–46.0)
Hemoglobin: 10.7 g/dL — ABNORMAL LOW (ref 12.0–15.0)
Immature Granulocytes: 0 %
Lymphocytes Relative: 25 %
Lymphs Abs: 1.6 10*3/uL (ref 0.7–4.0)
MCH: 27.6 pg (ref 26.0–34.0)
MCHC: 30.7 g/dL (ref 30.0–36.0)
MCV: 89.7 fL (ref 80.0–100.0)
Monocytes Absolute: 0.5 10*3/uL (ref 0.1–1.0)
Monocytes Relative: 8 %
Neutro Abs: 4.2 10*3/uL (ref 1.7–7.7)
Neutrophils Relative %: 64 %
Platelet Count: 170 10*3/uL (ref 150–400)
RBC: 3.88 MIL/uL (ref 3.87–5.11)
RDW: 16.1 % — ABNORMAL HIGH (ref 11.5–15.5)
WBC Count: 6.6 10*3/uL (ref 4.0–10.5)
nRBC: 0 % (ref 0.0–0.2)

## 2022-03-29 NOTE — Progress Notes (Signed)
Pt here for retacrit injection today. Hgb 10.7 g/dL. Parameters not met for injection. Pt given copy of lab work.

## 2022-03-29 NOTE — Progress Notes (Signed)
Subjective:    Patient ID: Jocelyn Kaufman is a 66 y.o. female.  HPI    Jocelyn Age, MD, PhD [facility] [address] [place], [place] [address]  Dear Dr. Shelia Kaufman,  I saw your patient, Jocelyn Kaufman, upon your kind request in my sleep clinic today for evaluation of her sleep disorder, in particular, reevaluation of her obstructive sleep apnea.  The patient is unaccompanied today.  As you know, Jocelyn Kaufman is a 66 year old right-handed woman with an underlying medical history of diabetes, Cushing's disease, hypothyroidism, hyperlipidemia, hypertension, depression, meralgia paresthetica, migraines, glaucoma, history of ulnar neuropathy, history of pericardial effusion, and severe obesity with a BMI of over 40, who reports having noticed an increase in her apnea scores on the ResMed app.  She has been consistent with her CPAP usage.  She uses nasal pillows with good success but her headgear has changed it does not sit as snugly as the previous style.  She has been up-to-date with her supplies.  She is still benefiting from treatment and has had some weight fluctuation over time.  She generally goes to bed anywhere between 9 PM and midnight and rise time is around 7 AM.  She drinks caffeine in the form of coffee, about 2 cups/day.  She is single and lives alone.  She has nocturia about once per average night.  She is currently on an antibiotic for a wound on her left leg.  She is followed by wound care.   She was diagnosed with obstructive sleep apnea many years ago originally.  I had evaluated her in 2018 for sleep apnea but she has not seen a sleep clinic since 2019.  I reviewed your office note from 01/22/2022.  She had a baseline sleep study through our sleep lab on 03/17/2017 which showed an AHI of 15.1/h, REM AHI of 34.8/h, supine AHI of 27.4/h, O2 nadir of 85%.  Her BMI was 37.6 at the time.  She had a subsequent titration study on 04/02/2017, at which time she did  well on CPAP of 7 cm. I reviewed her current CPAP compliance data from 03/17/2022, which is a total of 30 days, during which time she used her machine 29 days with percent use days greater than 4 hours at 97%, indicating excellent compliance with an average usage of 6 hours and 35 minutes, residual AHI elevated at 10.4/h, leak acceptable with the 95th percentile at 14.7 L/min on a pressure of 10 cm with EPR of 3. Her Epworth sleepiness score is 16 of 24, fatigue severity score is 46 out of 63.  Previously:   10/10/18 (Dr. Jannifer Kaufman): <<Jocelyn Kaufman is a 66 year old right-handed black female with a history of diabetes, dyslipidemia, and hypertension.  The patient was admitted to the hospital on 06 July 2018.  The patient began having symptoms the day before with dizziness, she was dizzy all day long and did not feel well.  The patient continued to have some dizziness the next day, at one point she had stood up to get a drink of water because she felt hot and then she fell over and fractured the humerus on the right.  From that point on, she began having a stuttering speech problem, word finding issues that persisted for 6 or 7 hours and then finally cleared.  The patient had some right-sided numbness and weakness, she went to the hospital and was admitted for a stroke evaluation.  MRI of the brain was done and  did not show evidence of a stroke.  The dizziness problem persisted for 3 or 4 weeks, and then was intermittent from that point but has gone away at this time.  The patient had MRA of the head that showed atherosclerotic changes in the carotid siphon on the right, carotid Doppler study was unremarkable, 2D echocardiogram was unremarkable.  The patient was placed on aspirin, her LDL was elevated at 126.  The patient has since been started on Lipitor.  She comes to this office for an evaluation.  She checks her blood pressures at home, they generally run in the 542-706 range systolic.  The patient indicates  that she has had dizziness in the past that has resolved, the last such event was in April 2018.>>  12/25/17 (Jocelyn Kaufman): << Jocelyn Kaufman is a 66 year old female with a history of obstructive sleep apnea on CPAP.  She returns today for follow-up.  Her CPAP download indicates that she use her machine 30 out of 30 days for compliance of 100%.  She use her machine greater than 4 hours 29 out of 30 days for compliance of 97%.  On average she uses her machine 7 hours and 49 minutes.  Her residual AHI is 5.8 on 8 cm of water with EPR 3.  Her leak in the 95th percentile is 10.4 L/min.  She states that she has a hard time keeping her mask in place at night.  Reports that she may be interested in trying a new mask if possible.  She also notes that she has had weight fluctuations since December.  Reports that this could be a result of her Cushing syndrome.  She returns today for evaluation.>>  06/24/17 (Jocelyn Kaufman): 66 year old right-handed woman with an underlying medical history of Cushing's disease, depression, diabetes with suboptimal control, hyperlipidemia, hypertension, migraines, meralgia paresthetica on the right side, and paresthesias, and obesity, who presents for follow-up consultation of her obstructive sleep apnea, after recent sleep study testing. The patient is unaccompanied today. I first met her on 03/07/2017 at the request of her primary care physician, at which time she reported snoring and excessive daytime somnolence. She had a prior diagnosis of OSA with sleep study testing more than 10 years prior and had not been using CPAP for at least 8 years. I suggested we proceed with a sleep study. She had a baseline sleep study, followed by a CPAP titration study. I went over her test results with her in detail today. Baseline sleep study from 03/17/2017 showed a sleep efficiency of 78%, sleep latency of 58 minutes, REM latency was 115 minutes. She had an increased percentage of stage I sleep, slow-wave sleep was 10.3% and REM  sleep was mildly increased at 28.6%. She had a total AHI of 15.1 per hour, REM AHI of 34.8 per hour, supine AHI of 27.4 per hour. Average oxygen saturation was 98%, nadir was 85%. She had no significant PLMS or EKG or EEG changes. Based on her test results I suggested she return for a full night CPAP titration study. She had this on 04/02/2017. Sleep efficiency was 78.9%, sleep latency 17.5 minutes, REM latency 202 minutes. She was fitted with medium nasal pillows and CPAP was titrated from 5 cm to 7 cm. On the final pressure her AHI was 0 per hour with supine non-REM sleep achieved an O2 nadir of 90%. She had an increased percentage of stage II sleep, slow-wave sleep was 13.8% and REM sleep was 16%. She had no significant PLMS. Based on her  test results I prescribed CPAP therapy for home use at 7 cm.   I reviewed her CPAP compliance data from 05/24/2017 through 06/22/2017, which is a total of 30 days, during which time she used her machine 29 days with percent used days greater than 4 hours at 93%, indicating excellent compliance with an average usage of 7 hours and 32 minutes, residual AHI borderline at 5.9 per hour, leaked low with the 95th percentile at 4.2 L/m on a pressure of 7 cm with EPR of 3. Residual AHI appears to be primarily obstructive in nature. She reports doing fairly well with her CPAP, she does notice some improvement in her sleep quality. She has had interim weight gain. At the time of her sleep study she weighed about 222 and current weight is about 232 pounds. She does report increase in weight, particularly secondary to stress and also not being able to exercise very much. She injured her left foot recently, second toe. She does notice an improvement in her sleep consolidation and daytime sluggishness, feels less "foggy headed".      03/07/2017 (Jocelyn Kaufman): (She) reports snoring and excessive daytime somnolence. I reviewed your office note from 02/27/2017, which you kindly included. Her Epworth  Sleepiness Scale score is 9 out of 24, fatigue score is 61 out of 63. She reports a prior diagnosis of obstructive sleep apnea and had sleep study testing over 10 years ago. She has not been using CPAP for several years, perhaps for the past 8 years. She is a nonsmoker and drinks alcohol rarely. She drinks caffeine in the form of coffee about 1 12 oz mugs per day. She is single, has no children, she lives alone. She is on medical leave currently.  She has a bedtime of 9-11 PM and WT is 6-8 AM. She denies RLS symptoms, but has numbness in the feet. She reports a family history of sleep apnea and one of her half-sisters and a nephew. She has nocturia about twice per average night and occasional morning headaches. She has her old CPAP machine but could no longer get supplies and her DME company went out of business.   Her Past Medical History Is Significant For: Past Medical History:  Diagnosis Date   Adrenal adenoma    Angio-edema    Asthma    Cushing disease ([place])    Depression    Diabetes mellitus without complication ([place])    Hyperlipidemia    Hypertension    Hypothyroidism    Meralgia paresthetica of right side 01/10/2017   Migraine    Pericardial effusion    Ulnar neuropathy at wrist, 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   Urticaria     Her Past Surgical History Is Significant For: Past Surgical History:  Procedure Laterality Date   ADENOIDECTOMY     APPENDECTOMY     CYST REMOVAL LEG Right    Removed from right thigh joint   ORIF HUMERUS FRACTURE Right 07/09/2018   Procedure: OPEN REDUCTION INTERNAL FIXATION (ORIF) HUMERAL SHAFT FRACTURE;  Surgeon: Hiram Gash, MD;  Location: [facility];  Service: Orthopedics;  Laterality: Right;   TONSILLECTOMY     TOTAL VAGINAL HYSTERECTOMY     UMBILICAL HERNIA REPAIR      Her Family History Is Significant For: Family History  Problem Relation Kaufman of Onset   Hypertension Mother    Stroke Mother    Kidney failure Mother    Heart disease Mother        heart murmur    Glaucoma Mother  Stroke Father    COPD Father    Cancer - Prostate Father    Colon cancer Father     Her Social History Is Significant For: Social History   Socioeconomic History   Marital status: Single    Spouse name: Not on file   Number of children: 0   Years of education: Masters   Highest education level: Not on file  Occupational History   Occupation: [employer]  Tobacco Use   Smoking status: Never   Smokeless tobacco: Never  Substance and Sexual Activity   Alcohol use: Not Currently    Comment: occ   Drug use: No   Sexual activity: Never  Other Topics Concern   Not on file  Social History Narrative   Lives alone   Caffeine use: 2 cups coffee per day   Tea sometimes   Soda- 3 x/week   Right-handed   Social Determinants of Health   Financial Resource Strain: Not on file  Food Insecurity: Not on file  Transportation Needs: Not on file  Physical Activity: Not on file  Stress: Not on file  Social Connections: Not on file    Her Allergies Are:  Allergies  Allergen Reactions   Ozempic (0.25 Or 0.5 Mg-Dose) [Semaglutide(0.25 Or 0.5mg -Dos)] Other (See Comments)    Cataracts requiring surgery and impairing vision   Jardiance [Empagliflozin] Itching and Swelling   Latex    Tramadol Hcl Other (See Comments)   Invokana [Canagliflozin] Other (See Comments)    Urinary incontinence   Percocet [Oxycodone-Acetaminophen] Nausea And Vomiting   Prilosec [Omeprazole] Rash   Rogaine [Minoxidil] Rash   Victoza [Liraglutide] Itching  :   Her Current Medications Are:  Outpatient Encounter Medications as of 03/29/2022  Medication Sig   Accu-Chek Softclix Lancets lancets use to check blood sugar   acetaminophen (TYLENOL) 500 MG tablet Take 1,000 mg by mouth every 6 (six) hours as needed.   Aflibercept (EYLEA) 2 MG/0.05ML SOLN 2 mg by Intravitreal route every 3 (three) months.   albuterol (VENTOLIN HFA) 108 (90 Base) MCG/ACT inhaler Inhale two puffs every  four to six hours as needed for cough or wheeze.   amLODipine (NORVASC) 10 MG tablet Take 1 tablet (10 mg total) by mouth daily.   aspirin EC 81 MG tablet Take 1 tablet (81 mg total) by mouth daily. Swallow whole.   atorvastatin (LIPITOR) 40 MG tablet Take 1 tablet (40 mg total) by mouth daily at 6 PM.   B-D ULTRAFINE III SHORT PEN 31G X 8 Jocelyn Kaufman MISC SMARTSIG:1 Each SUB-Q Daily   Beclomethasone Dipropionate (QVAR IN) Inhale 2 puffs into the lungs 3 (three) times daily as needed.   CHELATED IRON PO Take 18 mg by mouth daily.   ciclopirox (PENLAC) 8 % solution Apply topically at bedtime. Apply over nail and surrounding skin. Apply daily over previous coat. After seven (7) days, may remove with alcohol and continue cycle.   Dulaglutide (TRULICITY) 1.5 MG/0.5ML SOPN Inject into the skin. Once a week   EPINEPHrine (EPIPEN 2-PAK) 0.3 mg/0.3 mL IJ SOAJ injection Use as directed for life-threatening allergic reaction.   escitalopram (LEXAPRO) 20 MG tablet Take 20 mg by mouth daily.   ezetimibe (ZETIA) 10 MG tablet Take 1 tablet (10 mg total) by mouth daily.   fexofenadine (ALLEGRA ALLERGY) 180 MG tablet Take 1 tablet (180 mg total) by mouth daily.   folic acid (FOLVITE) 1 MG tablet Take 1 mg by mouth 2 (two) times daily.  hydrALAZINE (APRESOLINE) 50 MG tablet Take 50 mg by mouth 3 (three) times daily.   HYDROcodone-acetaminophen (NORCO/VICODIN) 5-325 MG tablet Take 1 tablet by mouth every 6 (six) hours as needed.   insulin aspart protamine - aspart (NOVOLOG 70/30 FLEXPEN RELION) (70-30) 100 UNIT/ML FlexPen Inject 0.15-0.2 mLs (15-20 Units total) into the skin in the morning, at noon, and at bedtime.   insulin glargine, 2 Unit Dial, (TOUJEO MAX SOLOSTAR) 300 UNIT/ML Solostar Pen Inject 46 Units into the skin daily.   irbesartan (AVAPRO) 300 MG tablet Take 1 tablet (300 mg total) by mouth daily. (Patient taking differently: Take 150 mg by mouth daily.)   Lancets Misc. (ACCU-CHEK SOFTCLIX LANCET DEV) KIT See  admin instructions.   latanoprost (XALATAN) 0.005 % ophthalmic solution 1 drop into affected eye in the evening   levothyroxine (SYNTHROID) 125 MCG tablet Take 125 mcg by mouth daily before breakfast. BRAND ONLY   LORazepam (ATIVAN) 0.5 MG tablet Take 0.5 mg by mouth as needed for anxiety.   metaxalone (SKELAXIN) 800 MG tablet Take 800 mg by mouth 3 (three) times daily as needed.   metoprolol succinate (TOPROL-XL) 25 MG 24 hr tablet Take by mouth.   Multiple Vitamins-Minerals (CENTRUM SILVER PO) Take 1 tablet by mouth daily.   sulfaSALAzine (AZULFIDINE) 500 MG tablet Take 3 tablets (1,500 mg total) by mouth 2 (two) times daily.   tocilizumab (ACTEMRA) 400 MG/20ML SOLN injection See admin instructions.   vitamin B-12 (CYANOCOBALAMIN) 100 MCG tablet Take 100 mcg by mouth daily.    vitamin B-12 (CYANOCOBALAMIN) 1000 MCG tablet Take by mouth. 3 times a week   VITAMIN D, ERGOCALCIFEROL, PO Take 2,000 mg by mouth 2 (two) times daily.   [DISCONTINUED] hydrALAZINE (APRESOLINE) 25 MG tablet Take 1 tablet by mouth 3 (three) times daily.   Abatacept (ORENCIA IV) Inject 1 Package into the vein every 30 (thirty) days. (unsure of dosage) (Patient not taking: Reported on 03/29/2022)   COVID-19 mRNA bivalent vaccine, Pfizer, (PFIZER COVID-19 VAC BIVALENT) injection Inject into the muscle.   epoetin alfa-epbx (RETACRIT) 2000 UNIT/ML injection See admin instructions. (Patient not taking: Reported on 03/29/2022)   Methotrexate Sodium (METHOTREXATE, PF,) 50 MG/2ML injection Inject 0.8 mLs (20 mg total) into the muscle once a week. 1 ml once a week (Patient not taking: Reported on 03/29/2022)   [DISCONTINUED] acetaZOLAMIDE ER (DIAMOX) 500 MG capsule Take by mouth.   [DISCONTINUED] ciclesonide (ALVESCO) 80 MCG/ACT inhaler Inhale 2 puffs into the lungs 2 (two) times daily.   [DISCONTINUED] Difluprednate 0.05 % EMUL Apply to eye.   [DISCONTINUED] Ferrous Bisglycinate Chelate 28 MG CAPS Take 1 capsule by mouth daily.    [DISCONTINUED] ferrous gluconate (FERGON) 324 MG tablet 1 tablet with water or juice between meals   [DISCONTINUED] hydrALAZINE (APRESOLINE) 50 MG tablet Take 1 tablet (50 mg total) by mouth 3 (three) times daily.   [DISCONTINUED] ketorolac (ACULAR) 0.5 % ophthalmic solution    [DISCONTINUED] timolol (BETIMOL) 0.25 % ophthalmic solution Place 1 drop into both eyes 2 (two) times daily.   [DISCONTINUED] timolol (TIMOPTIC) 0.5 % ophthalmic solution    No facility-administered encounter medications on file as of 03/29/2022.  :   Review of Systems:  Out of a complete 14 point review of systems, all are reviewed and negative with the exception of these symptoms as listed below:  Review of Systems  Neurological:        Cpap fu.  DME [facility]. Has been using.  Been 5 yrs since seen.  Has  SOB, fatigue from off meds rheumatoid arthritis.   Objective:  Neurological Exam  Physical Exam Physical Examination:   Vitals:   03/29/22 1259  BP: 129/66  Pulse: 68    General Examination: The patient is a very pleasant 66 y.o. female in no acute distress. She appears well-developed and well-nourished and well groomed.   HEENT: Normocephalic, atraumatic, pupils are equal and tracking is well-preserved, she has corrective eyeglasses in place.  Hearing is grossly intact, face is symmetric with normal facial animation.  Speech is clear without dysarthria, hypophonia or voice tremor.  Neck without difficulty in mobility, no carotid bruits. Oropharynx exam reveals: mild to moderate mouth dryness, adequate dental hygiene and moderate airway crowding. Tongue protrudes centrally and palate elevates symmetrically.    Chest: Clear to auscultation without wheezing, rhonchi or crackles noted.   Heart: S1+S2+0, regular and normal without murmurs, rubs or gallops noted.    Abdomen: Soft, non-tender and non-distended.   Extremities: There is no obvious edema.   Skin: Warm and dry without trophic changes noted.     Musculoskeletal: exam reveals no obvious joint deformities.    Neurologically:  Mental status: The patient is awake, alert and oriented in all 4 spheres. Her immediate and remote memory, attention, language skills and fund of knowledge are appropriate. There is no evidence of aphasia, agnosia, apraxia or anomia. Speech is clear with normal prosody and enunciation. Thought process is linear. Mood is normal and affect is somewhat blunted.    Cranial nerves II - XII are as described above under HEENT exam.  Motor exam: Normal bulk, strength and tone is noted. There is no obvious tremor.  Fine motor skills and coordination: grossly intact.  Cerebellar testing: No dysmetria or intention tremor. There is no truncal or gait ataxia. Sensory exam: intact to light touch in the upper and lower extremities.  Gait, station and balance: She stands without difficulty. Gait shows normal stride length and normal pace.   Assessment and Plan:  In summary, MAKENZEY NANNI is a very pleasant 66 year old female with an underlying medical history of diabetes, Cushing's disease, hypothyroidism, hyperlipidemia, hypertension, depression, meralgia paresthetica, migraines, glaucoma, history of ulnar neuropathy, history of pericardial effusion, and severe obesity with a BMI of over 40, who presents for reevaluation of her sleep apnea.  She was previously diagnosed with sleep apnea several years ago.  She had a baseline sleep study in May 2018 which indicated moderate obstructive sleep apnea. She had a CPAP titration study in June 2018, did well with CPAP of 7 cm.  We increased this to 8 cm and she is currently on 10 cm of water pressure.  She has mild residual sleep apnea with an AHI around 10 in the past month.  We will increase her pressure to 11 cm at this time.  She uses nasal pillows but is encouraged to make an appointment with her DME provider for a mask fit as she has had some trouble with the headgear lately.  She is  commended for her treatment adherence.  We will review a compliance download in about a month after the change, she is encouraged to call us or email [place] through [place] for this.  We will follow-up in this clinic in 6 months routinely, she can see one of our nurse practitioners.  We can see her yearly after that if she is stable.  I answered all her questions today and she was in agreement.   Thank you very much for allowing  me to participate in the care of this nice patient. If I can be of any further assistance to you please do not hesitate to call me at [phone].  Sincerely,   Stephfon Bovey, MD, PhD  

## 2022-03-29 NOTE — Patient Instructions (Signed)
It was nice to see you again today.  As discussed, I recommend we increase your CPAP pressure from 10 cm to 11 cm.  I will send the order to your DME company, [employer].  In addition, you can talk to them about your headgear issues, you may try a different style of nasal pillows with a different set of headgear, you will have to make an appointment with your DME provider for this.  Please follow-up to see Jocelyn Givens, NP in 6 months.  Please call us or email through [place] in about 4 to 6 weeks so we can review another compliance download for 30 days on the pressure of 11 cm.

## 2022-04-02 NOTE — Progress Notes (Signed)
Denyse Amass, RN; Vanessa Ralphs got it      Previous Messages   ----- Message -----  From: Brandon Melnick, RN  Sent: 03/29/2022   3:08 PM EDT  To: Ocie Bob   Good afternoon,   New orders in Epic.  I have changed in airview, but also needs mask fit etc.     Opal Sidles  Female, 66 y.o., 1956-05-14  MRN:  [id number]  [place]

## 2022-04-03 DIAGNOSIS — E113512 Type 2 diabetes mellitus with proliferative diabetic retinopathy with macular edema, left eye: Secondary | ICD-10-CM | POA: Diagnosis not present

## 2022-04-05 ENCOUNTER — Encounter: Payer: Self-pay | Admitting: Allergy

## 2022-04-05 ENCOUNTER — Ambulatory Visit: Payer: Medicare PPO | Admitting: Allergy

## 2022-04-05 VITALS — BP 122/70 | HR 82 | Temp 98.3°F | Resp 18 | Ht 64.5 in | Wt 241.4 lb

## 2022-04-05 DIAGNOSIS — Z91038 Other insect allergy status: Secondary | ICD-10-CM

## 2022-04-05 DIAGNOSIS — N182 Chronic kidney disease, stage 2 (mild): Secondary | ICD-10-CM | POA: Diagnosis not present

## 2022-04-05 DIAGNOSIS — J3089 Other allergic rhinitis: Secondary | ICD-10-CM | POA: Diagnosis not present

## 2022-04-05 DIAGNOSIS — J4520 Mild intermittent asthma, uncomplicated: Secondary | ICD-10-CM

## 2022-04-05 DIAGNOSIS — N390 Urinary tract infection, site not specified: Secondary | ICD-10-CM | POA: Diagnosis not present

## 2022-04-05 DIAGNOSIS — T781XXD Other adverse food reactions, not elsewhere classified, subsequent encounter: Secondary | ICD-10-CM

## 2022-04-05 MED ORDER — EPINEPHRINE 0.3 MG/0.3ML IJ SOAJ: INTRAMUSCULAR | 1 refills | Status: AC

## 2022-04-05 MED ORDER — IPRATROPIUM BROMIDE 0.06 % NA SOLN
NASAL | 5 refills | Status: DC
Start: 2022-04-05 — End: 2023-04-02

## 2022-04-05 NOTE — Patient Instructions (Addendum)
   1. Use [place] inhaler with spacer device.  Use 2 puffs twice a day during asthma flare or respiratory illnesses.    2.  Continue Albuterol HFA 2 puffs every 4-6 hours if needed for cough, wheezing, shortness of breath or chest tightness.  Monitor frequency of use.   3. Use nasal Atrovent 1-2 sprays each nostril as needed up to 3 times a day for nasal drainage/congestion/throat clearing symptoms  4. You have completed allergen immunotherapy!  You still have slight sensitivity to dust mites but this is much lower than prior to immunotherapy  5. OTC antihistamine- if needed can use either Xyzal or Allegra  6. Fish and shellfish IgE panel are negative thus if ever interested you would be eligible for in-office food challenge to see if no longer reactive and could potentially resume in diet.    7. Continue to have access to your epinephrine device.  Continue to avoid fish and shellfish as well as sting insects  Respiratory control goals:  Full participation in all desired activities (may need albuterol before activity) Albuterol use two time or less a week on average (not counting use with activity) Cough interfering with sleep two time or less a month Oral steroids no more than once a year No hospitalizations  Follow-up in 6 months or sooner if needed

## 2022-04-05 NOTE — Progress Notes (Signed)
Follow-up Note  RE: [name] MRN: [id number] DOB: [birth year] Date of Office Visit: 04/05/2022   History of present illness: Jocelyn Kaufman is a 66 y.o. female presenting today for follow-up of allergic rhinitis, intermittent asthma, hymenoptera allergy and at risk for reaction.  She was last seen in the office on 10/05/2021 by myself.  She states she has a tickle in throat over past several days.  She has been throat clearing often due to this.  She found a bottle of xyzal and that was not opened and is currently taking that.  As her allergy symptoms have been doing quite well since we stopped immunotherapy in December.  Her most recent testing showed that she has loss of sensitivity to pollens and just has sensitivity to dust mites still which is quite lower than it was previously.  She has not noted any increase in allergy symptoms earlier this spring which is great. In regards to her asthma she reports having an attack in February and couple minor ones in April none of which he needed to initiate Alvesco use.  She treated all of these with albuterol as needed use.  She did not require systemic steroids, ED or urgent care visits. She does continue to avoid fish and shellfish and has access to an epinephrine device.  At this time she has no interest in eating fish or shellfish in the diet. She has not had any sting's or need to use her epinephrine device. Of note she did have an unpleasant experience at the another office today with stress this visit which is evident in the spirometry as below.  Review of systems: Review of Systems  Constitutional: Negative.   HENT:         See HPI  Eyes: Negative.   Respiratory: Negative.    Cardiovascular: Negative.   Gastrointestinal: Negative.   Musculoskeletal: Negative.   Skin: Negative.   Allergic/Immunologic: Negative.   Neurological: Negative.      All other systems negative unless noted above in HPI  Past medical/social/surgical/family  history have been reviewed and are unchanged unless specifically indicated below.  No changes  Medication List: Current Outpatient Medications  Medication Sig Dispense Refill   Accu-Chek Softclix Lancets lancets use to check blood sugar     acetaminophen (TYLENOL) 500 MG tablet Take 1,000 mg by mouth every 6 (six) hours as needed.     Aflibercept (EYLEA) 2 MG/0.05ML SOLN 2 mg by Intravitreal route every 3 (three) months.     albuterol (VENTOLIN HFA) 108 (90 Base) MCG/ACT inhaler Inhale two puffs every four to six hours as needed for cough or wheeze. 18 g 2   amLODipine (NORVASC) 10 MG tablet Take 1 tablet (10 mg total) by mouth daily.     aspirin EC 81 MG tablet Take 1 tablet (81 mg total) by mouth daily. Swallow whole. 30 tablet 11   atorvastatin (LIPITOR) 40 MG tablet Take 1 tablet (40 mg total) by mouth daily at 6 PM. 30 tablet 0   B-D ULTRAFINE III SHORT PEN 31G X 8 MM MISC SMARTSIG:1 Each SUB-Q Daily     Beclomethasone Dipropionate (QVAR IN) Inhale 2 puffs into the lungs 3 (three) times daily as needed.     CHELATED IRON PO Take 18 mg by mouth daily.     ciclopirox (PENLAC) 8 % solution Apply topically at bedtime. Apply over nail and surrounding skin. Apply daily over previous coat. After seven (7) days, may remove with alcohol and  continue cycle. 6.6 mL 0   COVID-19 mRNA bivalent vaccine, Pfizer, (PFIZER COVID-19 VAC BIVALENT) injection Inject into the muscle. 0.3 mL 0   Dulaglutide (TRULICITY) 1.5 IO/0.3TD SOPN Inject into the skin. Once a week     epoetin alfa-epbx (RETACRIT) 2000 UNIT/ML injection See admin instructions.     escitalopram (LEXAPRO) 20 MG tablet Take 20 mg by mouth daily.     ezetimibe (ZETIA) 10 MG tablet Take 1 tablet (10 mg total) by mouth daily.     fexofenadine (ALLEGRA ALLERGY) 180 MG tablet Take 1 tablet (180 mg total) by mouth daily.     folic acid (FOLVITE) 1 MG tablet Take 1 mg by mouth 2 (two) times daily.      hydrALAZINE (APRESOLINE) 50 MG tablet Take 50  mg by mouth 3 (three) times daily.     HYDROcodone-acetaminophen (NORCO/VICODIN) 5-325 MG tablet Take 1 tablet by mouth every 6 (six) hours as needed.     insulin aspart protamine - aspart (NOVOLOG 70/30 FLEXPEN RELION) (70-30) 100 UNIT/ML FlexPen Inject 0.15-0.2 mLs (15-20 Units total) into the skin in the morning, at noon, and at bedtime. 15 mL 11   insulin glargine, 2 Unit Dial, (TOUJEO MAX SOLOSTAR) 300 UNIT/ML Solostar Pen Inject 46 Units into the skin daily.     ipratropium (ATROVENT) 0.06 % nasal spray Place 1-2 sprays each nostril as needed up to 3 times a day 15 mL 5   irbesartan (AVAPRO) 300 MG tablet Take 1 tablet (300 mg total) by mouth daily. (Patient taking differently: Take 150 mg by mouth daily.)     Lancets Misc. (ACCU-CHEK SOFTCLIX LANCET DEV) KIT See admin instructions.     latanoprost (XALATAN) 0.005 % ophthalmic solution 1 drop into affected eye in the evening     levothyroxine (SYNTHROID) 125 MCG tablet Take 125 mcg by mouth daily before breakfast. BRAND ONLY     LORazepam (ATIVAN) 0.5 MG tablet Take 0.5 mg by mouth as needed for anxiety.     metaxalone (SKELAXIN) 800 MG tablet Take 800 mg by mouth 3 (three) times daily as needed.     Methotrexate Sodium (METHOTREXATE, PF,) 50 MG/2ML injection Inject 0.8 mLs (20 mg total) into the muscle once a week. 1 ml once a week 2 mL    metoprolol succinate (TOPROL-XL) 25 MG 24 hr tablet Take by mouth.     Multiple Vitamins-Minerals (CENTRUM SILVER PO) Take 1 tablet by mouth daily.     sulfaSALAzine (AZULFIDINE) 500 MG tablet Take 3 tablets (1,500 mg total) by mouth 2 (two) times daily.     tocilizumab (ACTEMRA) 400 MG/20ML SOLN injection See admin instructions.     vitamin B-12 (CYANOCOBALAMIN) 100 MCG tablet Take 100 mcg by mouth daily.      vitamin B-12 (CYANOCOBALAMIN) 1000 MCG tablet Take by mouth. 3 times a week     VITAMIN D, ERGOCALCIFEROL, PO Take 2,000 mg by mouth 2 (two) times daily.     Abatacept (ORENCIA IV) Inject 1 Package  into the vein every 30 (thirty) days. (unsure of dosage) (Patient not taking: Reported on 03/29/2022)     EPINEPHrine (EPIPEN 2-PAK) 0.3 mg/0.3 mL IJ SOAJ injection Use as directed for life-threatening allergic reaction. 2 each 1   No current facility-administered medications for this visit.     Known medication allergies: Allergies  Allergen Reactions   Ozempic (0.25 Or 0.5 Mg-Dose) [Semaglutide(0.25 Or 0.5mg -Dos)] Other (See Comments)    Cataracts requiring surgery and impairing vision   Jardiance [Empagliflozin]  Itching and Swelling   Latex    Tramadol Hcl Other (See Comments)   Invokana [Canagliflozin] Other (See Comments)    Urinary incontinence   Percocet [Oxycodone-Acetaminophen] Nausea And Vomiting   Prilosec [Omeprazole] Rash   Rogaine [Minoxidil] Rash   Victoza [Liraglutide] Itching     Physical examination: Blood pressure 122/70, pulse 82, temperature 98.3 F (36.8 C), resp. rate 18, height 5' 4.5" (1.638 m), weight 241 lb 6 oz (109.5 kg), SpO2 97 %.  General: Alert, interactive, in no acute distress. HEENT: PERRLA, TMs pearly gray, turbinates non-edematous without discharge, post-pharynx non erythematous. Neck: Supple without lymphadenopathy. Lungs: Clear to auscultation without wheezing, rhonchi or rales. {no increased work of breathing. CV: Normal S1, S2 without murmurs. Abdomen: Nondistended, nontender. Skin: Warm and dry, without lesions or rashes. Extremities:  No clubbing, cyanosis or edema. Neuro:   Grossly intact.  Diagnositics/Labs: Labs:  Component     Latest Ref Rng 10/05/2021  IgE (Immunoglobulin E), Serum     6 - 495 IU/mL 103   D Pteronyssinus IgE     Class 0/I kU/L 0.29 !   D Farinae IgE     Class I kU/L 0.37 !   Cat Dander IgE     Class 0 kU/L <0.10   Dog Dander IgE     Class 0 kU/L <0.10   [place] Grass IgE     Class 0 kU/L <0.10   Timothy Grass IgE     Class 0 kU/L <0.10   [place] Grass IgE     Class 0 kU/L <0.10   Cockroach, German  IgE     Class 0 kU/L <0.10   Penicillium Chrysogen IgE     Class 0 kU/L <0.10   Cladosporium Herbarum IgE     Class 0 kU/L <0.10   Aspergillus Fumigatus IgE     Class 0 kU/L <0.10   Alternaria Alternata IgE     Class 0 kU/L <0.10   Maple/Box Elder IgE     Class 0 kU/L <0.10   Common Silver Wendee Copp IgE     Class 0 kU/L <0.10   Cedar, [place] IgE     Class 0 kU/L <0.10   Oak, White IgE     Class 0 kU/L <0.10   Elm, American IgE     Class 0 kU/L <0.10   C[place]wood IgE     Class 0 kU/L <0.10   Pecan, Hickory IgE     Class 0 kU/L <0.10   White Mulberry IgE     Class 0 kU/L <0.10   Ragweed, Short IgE     Class 0 kU/L <0.10   Pigweed, Rough IgE     Class 0 kU/L <0.10   Sheep Sorrel IgE Qn     Class 0 kU/L <0.10   Mouse Urine IgE     Class 0 kU/L <0.10   Codfish IgE     Class 0 kU/L <0.10   Halibut IgE     Class 0 kU/L <0.10   Allergen Walley Pike IgE     Class 0 kU/L <0.10   Tuna     Class 0 kU/L <0.10   Allergen Salmon IgE     Class 0 kU/L <0.10   Allergen Mackerel IgE     Class 0 kU/L <0.10   Allergen Trout IgE     Class 0 kU/L <0.10   Clam IgE     Class 0 kU/L <0.10   F023-IgE Crab  Class 0 kU/L <0.10   Shrimp IgE     Class 0 kU/L <0.10   Scallop IgE     Class 0 kU/L <0.10   F290-IgE Oyster     Class 0 kU/L <0.10   F080-IgE Lobster     Class 0 kU/L <0.10     Spirometry: FEV1: 1.27L 65%, FVC: 1.72L 69% predicted.  Her first attempt was her best attempt.  The other 2 attempts are not reflective of true lung function.  She has had normal lung function previously.  Assessment and plan: Mild intermittent asthma -despite several flares requiring albuterol use control still remains good Allergic rhinitis -current symptoms of throat clearing throat could be due to the poor air quality right now as it has been ongoing for several days.  Believe the throat clearing is most likely due to nasal drainage as will treat this with nasal Atrovent Hymenoptera  allergy Adverse food reaction      1. Use Alvesco inhaler with spacer device.  Use 2 puffs twice a day during asthma flare or respiratory illnesses.    2.  Continue Albuterol HFA 2 puffs every 4-6 hours if needed for cough, wheezing, shortness of breath or chest tightness.  Monitor frequency of use.   3. Use nasal Atrovent 1-2 sprays each nostril as needed up to 3 times a day for nasal drainage/congestion/throat clearing symptoms  4. You have completed allergen immunotherapy!  You still have slight sensitivity to dust mites but this is much lower than prior to immunotherapy  5. OTC antihistamine- if needed can use either Xyzal or Allegra  6. Fish and shellfish IgE panel are negative thus if ever interested you would be eligible for in-office food challenge to see if no longer reactive and could potentially resume in diet.  Continue to have access to your epinephrine device.   Respiratory control goals:  Full participation in all desired activities (may need albuterol before activity) Albuterol use two time or less a week on average (not counting use with activity) Cough interfering with sleep two time or less a month Oral steroids no more than once a year No hospitalizations  7. Continue to have access to your epinephrine device.  Continue to avoid fish and shellfish as well as sting insects  Follow-up in 6 months or sooner if needed  I appreciate the opportunity to take part in [place] care. Please do not hesitate to contact me with questions.  Sincerely,   Prudy Feeler, MD Allergy/Immunology [facility]

## 2022-04-10 ENCOUNTER — Encounter (HOSPITAL_BASED_OUTPATIENT_CLINIC_OR_DEPARTMENT_OTHER): Payer: Medicare PPO | Attending: Internal Medicine | Admitting: Internal Medicine

## 2022-04-10 DIAGNOSIS — Z7985 Long-term (current) use of injectable non-insulin antidiabetic drugs: Secondary | ICD-10-CM | POA: Insufficient documentation

## 2022-04-10 DIAGNOSIS — E039 Hypothyroidism, unspecified: Secondary | ICD-10-CM | POA: Diagnosis not present

## 2022-04-10 DIAGNOSIS — M069 Rheumatoid arthritis, unspecified: Secondary | ICD-10-CM | POA: Diagnosis not present

## 2022-04-10 DIAGNOSIS — Y848 Other medical procedures as the cause of abnormal reaction of the patient, or of later complication, without mention of misadventure at the time of the procedure: Secondary | ICD-10-CM | POA: Insufficient documentation

## 2022-04-10 DIAGNOSIS — E11622 Type 2 diabetes mellitus with other skin ulcer: Secondary | ICD-10-CM | POA: Diagnosis not present

## 2022-04-10 DIAGNOSIS — L814 Other melanin hyperpigmentation: Secondary | ICD-10-CM | POA: Insufficient documentation

## 2022-04-10 DIAGNOSIS — L97822 Non-pressure chronic ulcer of other part of left lower leg with fat layer exposed: Secondary | ICD-10-CM | POA: Diagnosis not present

## 2022-04-10 DIAGNOSIS — T8131XA Disruption of external operation (surgical) wound, not elsewhere classified, initial encounter: Secondary | ICD-10-CM | POA: Diagnosis not present

## 2022-04-12 DIAGNOSIS — I129 Hypertensive chronic kidney disease with stage 1 through stage 4 chronic kidney disease, or unspecified chronic kidney disease: Secondary | ICD-10-CM | POA: Diagnosis not present

## 2022-04-12 DIAGNOSIS — R809 Proteinuria, unspecified: Secondary | ICD-10-CM | POA: Diagnosis not present

## 2022-04-12 DIAGNOSIS — N182 Chronic kidney disease, stage 2 (mild): Secondary | ICD-10-CM | POA: Diagnosis not present

## 2022-04-12 DIAGNOSIS — E1122 Type 2 diabetes mellitus with diabetic chronic kidney disease: Secondary | ICD-10-CM | POA: Diagnosis not present

## 2022-04-12 DIAGNOSIS — E1129 Type 2 diabetes mellitus with other diabetic kidney complication: Secondary | ICD-10-CM | POA: Diagnosis not present

## 2022-04-12 NOTE — Progress Notes (Signed)
YULIYA, NOVA ([id number]) Visit Report for 04/10/2022 Chief Complaint Document Details Patient Name: Date of Service: Jocelyn Kaufman, Jocelyn Kaufman 04/10/2022 9:15 A M Medical Record Number: [id number] Patient Account Number: [id number] Date of Birth/Sex: Treating RN: [birth year] (66 y.o. Tonita Phoenix, Lauren Primary Care Provider: Deland Pretty Other Clinician: Referring Provider: Treating Provider/Extender: Judie Grieve in Treatment: 3 Information Obtained from: Patient Chief Complaint 03/20/2022; left posterior wound status post punch biopsy in March 2023 Electronic Signature(s) Signed: 04/10/2022 10:50:35 AM By: Kalman Shan DO Entered By: Kalman Shan on 04/10/2022 10:43:36 -------------------------------------------------------------------------------- Debridement Details Patient Name: Date of Service: Jocelyn Kaufman 04/10/2022 9:15 A M Medical Record Number: [id number] Patient Account Number: [id number] Date of Birth/Sex: Treating RN: 11-16-1955 (66 y.o. Tonita Phoenix, Lauren Primary Care Provider: Deland Pretty Other Clinician: Referring Provider: Treating Provider/Extender: Judie Grieve in Treatment: 3 Debridement Performed for Assessment: Wound #1 Left,Posterior Lower Leg Performed By: Physician Kalman Shan, DO Debridement Type: Debridement Level of Consciousness (Pre-procedure): Awake and Alert Pre-procedure Verification/Time Out Yes - 10:00 Taken: Start Time: 10:00 Pain Control: Lidocaine T Area Debrided (L x W): otal 1.8 (cm) x 1.7 (cm) = 3.06 (cm) Tissue and other material debrided: Slough, Subcutaneous, Slough Level: Skin/Subcutaneous Tissue Debridement Description: Excisional Instrument: Curette Bleeding: Minimum Hemostasis Achieved: Pressure End Time: 10:00 Procedural Pain: 0 Post Procedural Pain: 0 Response to Treatment: Procedure was tolerated well Level of Consciousness (Post- Awake and  Alert procedure): Post Debridement Measurements of Total Wound Length: (cm) 1.8 Width: (cm) 1.7 Depth: (cm) 0.2 Volume: (cm) 0.481 Character of Wound/Ulcer Post Debridement: Improved Post Procedure Diagnosis Same as Pre-procedure Electronic Signature(s) Signed: 04/10/2022 10:50:35 AM By: Kalman Shan DO Signed: 04/12/2022 5:29:07 PM By: Rhae Hammock RN Entered By: Rhae Hammock on 04/10/2022 10:02:16 -------------------------------------------------------------------------------- HPI Details Patient Name: Date of Service: Jocelyn Kaufman. 04/10/2022 9:15 A M Medical Record Number: [id number] Patient Account Number: [id number] Date of Birth/Sex: Treating RN: [birth year] (65 y.o. Tonita Phoenix, Lauren Primary Care Provider: Deland Pretty Other Clinician: Referring Provider: Treating Provider/Extender: Judie Grieve in Treatment: 3 History of Present Illness HPI Description: 03/20/2022 Jocelyn Kaufman is a 66 year old female with a past medical history of rheumatoid arthritis, hypothyroidism, Type 2 diabetes on injectable medication that presents to the clinic for a wound to the posterior left leg. She states that on March 31 she had a punch biopsy done by her dermatologist for a suspicious lesion. The biopsy results were reported as lentigo. She states she has follow-up with her dermatologist in 6 months. The punch biopsy site has never closed. She states at one point it was well-healing however appears to have gotten infected. She has been on 2 rounds of doxycycline. She is currently keeping the area covered. She still reports tenderness to the wound site. She denies drainage. 5/30; patient presents for follow-up. She is almost done with her Keflex prescribed at last clinic visit. She has been using Dakin's wet-to-dry dressings to the wound bed. There is improvement in chronic pain to the wound site. She denies signs of infection. 6/13; patient presents  for follow-up. She has been using Medihoney to the wound bed without issues. She denies signs of infection. She has no questions or complaints today. Electronic Signature(s) Signed: 04/10/2022 10:50:35 AM By: Kalman Shan DO Entered By: Kalman Shan on 04/10/2022 10:44:09 -------------------------------------------------------------------------------- Physical Exam Details Patient Name: Date of Service: Jocelyn Kaufman, Jocelyn Kaufman 04/10/2022 9:15 A M Medical Record Number: [id number] Patient Account Number: [id number]  Date of Birth/Sex: Treating RN: July 11, 1956 (66 y.o. Tonita Phoenix, Lauren Primary Care Provider: Deland Pretty Other Clinician: Referring Provider: Treating Provider/Extender: Judie Grieve in Treatment: 3 Constitutional respirations regular, non-labored and within target range for patient.. Cardiovascular 2+ dorsalis pedis/posterior tibialis pulses. Psychiatric pleasant and cooperative. Notes Left lower extremity: T the posterior aspect there are 2 open wounds with nonviable tissue on the surface. Postdebridement there is granulation tissue present. o This is a previous punch biopsy site. No surrounding signs of infection. Electronic Signature(s) Signed: 04/10/2022 10:50:35 AM By: Kalman Shan DO Entered By: Kalman Shan on 04/10/2022 10:45:28 -------------------------------------------------------------------------------- Physician Orders Details Patient Name: Date of Service: Jocelyn Kaufman, Jocelyn Kaufman 04/10/2022 9:15 A M Medical Record Number: [id number] Patient Account Number: [id number] Date of Birth/Sex: Treating RN: 09-05-1956 (66 y.o. Tonita Phoenix, Lauren Primary Care Provider: Deland Pretty Other Clinician: Referring Provider: Treating Provider/Extender: Judie Grieve in Treatment: 3 Verbal / Phone Orders: No Diagnosis Coding Follow-up Appointments ppointment in 1 week. - Tuesday 04/17/22 @ 1045 w/ Dr. Heber [place] and  Allayne Butcher Room # 9 Return A Bathing/ Shower/ Hygiene May shower with protection but do not get wound dressing(s) wet. Edema Control - Lymphedema / SCD / Other Bilateral Lower Extremities Elevate legs to the level of the heart or above for 30 minutes daily and/or when sitting, a frequency of: Avoid standing for long periods of time. Wound Treatment Wound #1 - Lower Leg Wound Laterality: Left, Posterior Cleanser: Soap and Water 1 x Per Week/7 Days Discharge Instructions: May shower and wash wound with dial antibacterial soap and water prior to dressing change. Cleanser: Wound Cleanser (Generic) 1 x Per Week/7 Days Discharge Instructions: Cleanse the wound with Dakin's cleaner. Prim Dressing: Hydrofera Blue Ready Foam, 2.5 x2.5 in 1 x Per Week/7 Days ary Discharge Instructions: Apply to wound bed as instructed Prim Dressing: Santyl Ointment 1 x Per Week/7 Days ary Discharge Instructions: Apply nickel thick amount to wound bed as instructed Secondary Dressing: ABD Pad, 5x9 1 x Per Week/7 Days Discharge Instructions: Apply over primary dressing as directed. Secondary Dressing: Woven Gauze Sponge, Non-Sterile 4x4 in (Generic) 1 x Per Week/7 Days Discharge Instructions: Apply over primary dressing as directed. Compression Wrap: Kerlix Roll 4.5x3.1 (in/yd) 1 x Per Week/7 Days Discharge Instructions: Apply Kerlix and Coban compression as directed. Compression Wrap: Coban Self-Adherent Wrap 4x5 (in/yd) 1 x Per Week/7 Days Discharge Instructions: Apply over Kerlix as directed. Electronic Signature(s) Signed: 04/10/2022 10:50:35 AM By: Kalman Shan DO Entered By: Kalman Shan on 04/10/2022 10:45:35 -------------------------------------------------------------------------------- Problem List Details Patient Name: Date of Service: Jocelyn Kaufman, Jocelyn Kaufman 04/10/2022 9:15 A M Medical Record Number: [id number] Patient Account Number: [id number] Date of Birth/Sex: Treating RN: 06/14/1956 (66 y.o. Tonita Phoenix, Lauren Primary Care Provider: Deland Pretty Other Clinician: Referring Provider: Treating Provider/Extender: Judie Grieve in Treatment: 3 Active Problems ICD-10 Encounter Code Description Active Date MDM Diagnosis [phone] Non-pressure chronic ulcer of other part of left lower leg with fat layer exposed5/23/2023 No Yes T81.31XA Disruption of external operation (surgical) wound, not elsewhere classified, 03/20/2022 No Yes initial encounter E11.622 Type 2 diabetes mellitus with other skin ulcer 03/20/2022 No Yes M06.9 Rheumatoid arthritis, unspecified 03/20/2022 No Yes Inactive Problems Resolved Problems Electronic Signature(s) Signed: 04/10/2022 10:50:35 AM By: Kalman Shan DO Entered By: Kalman Shan on 04/10/2022 10:43:23 -------------------------------------------------------------------------------- Progress Note Details Patient Name: Date of Service: Jocelyn Kaufman. 04/10/2022 9:15 A M Medical Record Number: [id number] Patient Account Number: [id number] Date of  Birth/Sex: Treating RN: 1956/04/21 (66 y.o. Tonita Phoenix, Lauren Primary Care Provider: Deland Pretty Other Clinician: Referring Provider: Treating Provider/Extender: Judie Grieve in Treatment: 3 Subjective Chief Complaint Information obtained from Patient 03/20/2022; left posterior wound status post punch biopsy in March 2023 History of Present Illness (HPI) 03/20/2022 [name] is a 66 year old female with a past medical history of rheumatoid arthritis, hypothyroidism, Type 2 diabetes on injectable medication that presents to the clinic for a wound to the posterior left leg. She states that on March 31 she had a punch biopsy done by her dermatologist for a suspicious lesion. The biopsy results were reported as lentigo. She states she has follow-up with her dermatologist in 6 months. The punch biopsy site has never closed. She states at one point it  was well-healing however appears to have gotten infected. She has been on 2 rounds of doxycycline. She is currently keeping the area covered. She still reports tenderness to the wound site. She denies drainage. 5/30; patient presents for follow-up. She is almost done with her Keflex prescribed at last clinic visit. She has been using Dakin's wet-to-dry dressings to the wound bed. There is improvement in chronic pain to the wound site. She denies signs of infection. 6/13; patient presents for follow-up. She has been using Medihoney to the wound bed without issues. She denies signs of infection. She has no questions or complaints today. Patient History Information obtained from Patient. Family History Cancer - Siblings,Father, Diabetes - Siblings, Heart Disease - Mother,Father, Hypertension - Mother, Kidney Disease - Mother,Siblings, Lung Disease - Father, Seizures - Siblings, Stroke - Mother,Father, Thyroid Problems - Siblings, No family history of Tuberculosis. Social History Never smoker, Marital Status - Single, Alcohol Use - Never, Drug Use - No History, Caffeine Use - Daily. Medical History Hematologic/Lymphatic Patient has history of Anemia Respiratory Patient has history of Asthma Endocrine Patient has history of Type II Diabetes Musculoskeletal Patient has history of Rheumatoid Arthritis Medical A Surgical History Notes nd Cardiovascular Extra beats Objective Constitutional respirations regular, non-labored and within target range for patient.. Vitals Time Taken: 9:41 AM, Height: 64 in, Weight: 237 lbs, BMI: 40.7, Temperature: 98.9 F, Pulse: 69 bpm, Respiratory Rate: 18 breaths/min, Blood Pressure: 134/73 mmHg. Cardiovascular 2+ dorsalis pedis/posterior tibialis pulses. Psychiatric pleasant and cooperative. General Notes: Left lower extremity: T the posterior aspect there are 2 open wounds with nonviable tissue on the surface. Postdebridement there is  granulation o tissue present. This is a previous punch biopsy site. No surrounding signs of infection. Integumentary (Hair, Skin) Wound #1 status is Open. Original cause of wound was Gradually Appeared. The date acquired was: 01/26/2022. The wound has been in treatment 3 weeks. The wound is located on the Left,Posterior Lower Leg. The wound measures 1.8cm length x 1.7cm width x 0.2cm depth; 2.403cm^2 area and 0.481cm^3 volume. There is no tunneling or undermining noted. There is a medium amount of serosanguineous drainage noted. The wound margin is distinct with the outline attached to the wound base. There is no granulation within the wound bed. There is a large (67-100%) amount of necrotic tissue within the wound bed including Eschar. Assessment Active Problems ICD-10 Non-pressure chronic ulcer of other part of left lower leg with fat layer exposed Disruption of external operation (surgical) wound, not elsewhere classified, initial encounter Type 2 diabetes mellitus with other skin ulcer Rheumatoid arthritis, unspecified Patient's wounds have shown improvement in size and appearance since last clinic visit. I debrided nonviable tissue. I recommended doing a  compression wrap as she has some swelling on exam and would benefit from compression therapy. She was in agreement with this. She has strong pedal pulses And should have adequate blood flow for healing. We will start with Kerlix/Coban. I will switch the dressing to Santyl and Hydrofera Blue. Follow-up in 1 week. She knows to not get the wrap wet and cannot keep this on for more than 7 days. Procedures Wound #1 Pre-procedure diagnosis of Wound #1 is an Open Surgical Wound located on the Left,Posterior Lower Leg . There was a Excisional Skin/Subcutaneous Tissue Debridement with a total area of 3.06 sq cm performed by Kalman Shan, DO. With the following instrument(s): Curette Material removed includes Subcutaneous Tissue and Slough and  after achieving pain control using Lidocaine. No specimens were taken. A time out was conducted at 10:00, prior to the start of the procedure. A Minimum amount of bleeding was controlled with Pressure. The procedure was tolerated well with a pain level of 0 throughout and a pain level of 0 following the procedure. Post Debridement Measurements: 1.8cm length x 1.7cm width x 0.2cm depth; 0.481cm^3 volume. Character of Wound/Ulcer Post Debridement is improved. Post procedure Diagnosis Wound #1: Same as Pre-Procedure Plan Follow-up Appointments: Return Appointment in 1 week. - Tuesday 04/17/22 @ 1045 w/ Dr. Heber [place] and Allayne Butcher Room # 9 Bathing/ Shower/ Hygiene: May shower with protection but do not get wound dressing(s) wet. Edema Control - Lymphedema / SCD / Other: Elevate legs to the level of the heart or above for 30 minutes daily and/or when sitting, a frequency of: Avoid standing for long periods of time. WOUND #1: - Lower Leg Wound Laterality: Left, Posterior Cleanser: Soap and Water 1 x Per Week/7 Days Discharge Instructions: May shower and wash wound with dial antibacterial soap and water prior to dressing change. Cleanser: Wound Cleanser (Generic) 1 x Per Week/7 Days Discharge Instructions: Cleanse the wound with Dakin's cleaner. Prim Dressing: Hydrofera Blue Ready Foam, 2.5 x2.5 in 1 x Per Week/7 Days ary Discharge Instructions: Apply to wound bed as instructed Prim Dressing: Santyl Ointment 1 x Per Week/7 Days ary Discharge Instructions: Apply nickel thick amount to wound bed as instructed Secondary Dressing: ABD Pad, 5x9 1 x Per Week/7 Days Discharge Instructions: Apply over primary dressing as directed. Secondary Dressing: Woven Gauze Sponge, Non-Sterile 4x4 in (Generic) 1 x Per Week/7 Days Discharge Instructions: Apply over primary dressing as directed. Com pression Wrap: Kerlix Roll 4.5x3.1 (in/yd) 1 x Per Week/7 Days Discharge Instructions: Apply Kerlix and Coban compression  as directed. Com pression Wrap: Coban Self-Adherent Wrap 4x5 (in/yd) 1 x Per Week/7 Days Discharge Instructions: Apply over Kerlix as directed. 1. In office sharp debridement 2. Santyl and Hydrofera Blue under Kerlix/Coban 3. Follow-up in 1 week Electronic Signature(s) Signed: 04/10/2022 10:50:35 AM By: Kalman Shan DO Entered By: Kalman Shan on 04/10/2022 10:49:53 -------------------------------------------------------------------------------- HxROS Details Patient Name: Date of Service: Tyrone Nine REN O. 04/10/2022 9:15 A M Medical Record Number: [id number] Patient Account Number: [id number] Date of Birth/Sex: Treating RN: Mar 01, 1956 (66 y.o. Tonita Phoenix, Lauren Primary Care Provider: Deland Pretty Other Clinician: Referring Provider: Treating Provider/Extender: Judie Grieve in Treatment: 3 Information Obtained From Patient Hematologic/Lymphatic Medical History: Positive for: Anemia Respiratory Medical History: Positive for: Asthma Cardiovascular Medical History: Past Medical History Notes: Extra beats Endocrine Medical History: Positive for: Type II Diabetes Time with diabetes: 20 years Treated with: Insulin Blood sugar tested every day: Yes Tested : once a day Musculoskeletal Medical History: Positive for:  Rheumatoid Arthritis Immunizations Pneumococcal Vaccine: Received Pneumococcal Vaccination: Yes Received Pneumococcal Vaccination On or After 60th Birthday: Yes Implantable Devices None Family and Social History Cancer: Yes - Siblings,Father; Diabetes: Yes - Siblings; Heart Disease: Yes - Mother,Father; Hypertension: Yes - Mother; Kidney Disease: Yes - Mother,Siblings; Lung Disease: Yes - Father; Seizures: Yes - Siblings; Stroke: Yes - Mother,Father; Thyroid Problems: Yes - Siblings; Tuberculosis: No; Never smoker; Marital Status - Single; Alcohol Use: Never; Drug Use: No History; Caffeine Use: Daily; Financial Concerns: No;  Food, Clothing or Shelter Needs: No; Support System Lacking: Yes; Transportation Concerns: No Electronic Signature(s) Signed: 04/10/2022 10:50:35 AM By: Kalman Shan DO Signed: 04/12/2022 5:29:07 PM By: Rhae Hammock RN Entered By: Kalman Shan on 04/10/2022 10:44:13 -------------------------------------------------------------------------------- SuperBill Details Patient Name: Date of Service: Jocelyn Kaufman 04/10/2022 Medical Record Number: [id number] Patient Account Number: [id number] Date of Birth/Sex: Treating RN: [birth year] (66 y.o. Tonita Phoenix, Lauren Primary Care Provider: Deland Pretty Other Clinician: Referring Provider: Treating Provider/Extender: Judie Grieve in Treatment: 3 Diagnosis Coding ICD-10 Codes Code Description [phone] Non-pressure chronic ulcer of other part of left lower leg with fat layer exposed T81.31XA Disruption of external operation (surgical) wound, not elsewhere classified, initial encounter E11.622 Type 2 diabetes mellitus with other skin ulcer M06.9 Rheumatoid arthritis, unspecified Facility Procedures Physician Procedures : CPT4 Code Description Modifier [id number] - WC PHYS LEVEL 3 - EST PT ICD-10 Diagnosis Description L97.822 Non-pressure chronic ulcer of other part of left lower leg with fat layer exposed T81.31XA Disruption of external operation (surgical) wound,  not elsewhere classified, initial encounter E11.622 Type 2 diabetes mellitus with other skin ulcer M06.9 Rheumatoid arthritis, unspecified Quantity: 1 : [id number] - WC PHYS SUBQ TISS 20 SQ CM ICD-10 Diagnosis Description L97.822 Non-pressure chronic ulcer of other part of left lower leg with fat layer exposed Quantity: 1 Electronic Signature(s) Signed: 04/10/2022 10:50:35 AM By: Kalman Shan DO Entered By: Kalman Shan on 04/10/2022 10:50:09

## 2022-04-12 NOTE — Progress Notes (Signed)
Jocelyn, Kaufman ([id number]) Visit Report for 04/10/2022 Arrival Information Details Patient Name: Date of Service: Jocelyn Kaufman, Jocelyn Kaufman 04/10/2022 9:15 A M Medical Record Number: [id number] Patient Account Number: [id number] Date of Birth/Sex: Treating RN: June 21, 1956 (66 y.o. Tonita Phoenix, Lauren Primary Care Lizeth Bencosme: Deland Pretty Other Clinician: Referring Brant Peets: Treating Cleda Imel/Extender: Judie Grieve in Treatment: 3 Visit Information History Since Last Visit Added or deleted any medications: No Patient Arrived: Ambulatory Any new allergies or adverse reactions: No Arrival Time: 09:40 Had a fall or experienced change in No Accompanied By: self activities of daily living that may affect Transfer Assistance: None risk of falls: Patient Identification Verified: Yes Signs or symptoms of abuse/neglect since last visito No Secondary Verification Process Completed: Yes Hospitalized since last visit: No Patient Has Alerts: Yes Implantable device outside of the clinic excluding No Patient Alerts: *Prefers left arm for BP cellular tissue based products placed in the center since last visit: Has Dressing in Place as Prescribed: Yes Has Compression in Place as Prescribed: Yes Pain Present Now: Yes Electronic Signature(s) Signed: 04/11/2022 8:50:49 AM By: Erenest Blank Entered By: Erenest Blank on 04/10/2022 09:41:10 -------------------------------------------------------------------------------- Encounter Discharge Information Details Patient Name: Date of Service: Jocelyn Kaufman. 04/10/2022 9:15 A M Medical Record Number: [id number] Patient Account Number: [id number] Date of Birth/Sex: Treating RN: [birth year] (66 y.o. Tonita Phoenix, Lauren Primary Care Rashada Klontz: Deland Pretty Other Clinician: Referring Gentle Hoge: Treating Johnpaul Gillentine/Extender: Judie Grieve in Treatment: 3 Encounter Discharge Information Items Post Procedure  Vitals Discharge Condition: Stable Temperature (F): 98.7 Ambulatory Status: Ambulatory Pulse (bpm): 74 Discharge Destination: Home Respiratory Rate (breaths/min): 17 Transportation: Private Auto Blood Pressure (mmHg): 134/74 Accompanied By: self Schedule Follow-up Appointment: Yes Clinical Summary of Care: Patient Declined Electronic Signature(s) Signed: 04/12/2022 5:29:07 PM By: Rhae Hammock RN Entered By: Rhae Hammock on 04/10/2022 10:11:47 -------------------------------------------------------------------------------- Lower Extremity Assessment Details Patient Name: Date of Service: Jocelyn Kaufman 04/10/2022 9:15 A M Medical Record Number: [id number] Patient Account Number: [id number] Date of Birth/Sex: Treating RN: December 13, 1955 (66 y.o. Tonita Phoenix, Lauren Primary Care Zaliah Wissner: Deland Pretty Other Clinician: Referring Danzel Marszalek: Treating Ninah Moccio/Extender: Judie Grieve in Treatment: 3 Edema Assessment Assessed: Shirlyn Goltz: No] Patrice Paradise: No] Edema: [Left: Ye] [Right: s] Calf Left: Right: Point of Measurement: 30 cm From Medial Instep 44.4 cm Ankle Left: Right: Point of Measurement: 7 cm From Medial Instep 24.3 cm Vascular Assessment Pulses: Dorsalis Pedis Palpable: [Left:Yes] Electronic Signature(s) Signed: 04/11/2022 8:50:49 AM By: Erenest Blank Signed: 04/12/2022 5:29:07 PM By: Rhae Hammock RN Entered By: Erenest Blank on 04/10/2022 09:52:24 -------------------------------------------------------------------------------- Multi Wound Chart Details Patient Name: Date of Service: Jocelyn Kaufman. 04/10/2022 9:15 A M Medical Record Number: [id number] Patient Account Number: [id number] Date of Birth/Sex: Treating RN: 10/13/1956 (66 y.o. Tonita Phoenix, Lauren Primary Care Mallary Kreger: Deland Pretty Other Clinician: Referring Nyiah Pianka: Treating Kida Digiulio/Extender: Judie Grieve in Treatment: 3 Vital  Signs Height(in): 64 Pulse(bpm): 69 Weight(lbs): 237 Blood Pressure(mmHg): 134/73 Body Mass Index(BMI): 40.7 Temperature(F): 98.9 Respiratory Rate(breaths/min): 18 Photos: [N/A:N/A] Left, Posterior Lower Leg N/A N/A Wound Location: Gradually Appeared N/A N/A Wounding Event: Open Surgical Wound N/A N/A Primary Etiology: Anemia, Asthma, Type II Diabetes, N/A N/A Comorbid History: Rheumatoid Arthritis 01/26/2022 N/A N/A Date Acquired: 3 N/A N/A Weeks of Treatment: Open N/A N/A Wound Status: No N/A N/A Wound Recurrence: 1.8x1.7x0.2 N/A N/A Measurements L x W x D (cm) 2.403 N/A N/A A (cm) : rea 0.481 N/A N/A Volume (cm) : 59.20% N/A  N/A % Reduction in A rea: 72.80% N/A N/A % Reduction in Volume: Unclassifiable N/A N/A Classification: Medium N/A N/A Exudate A mount: Serosanguineous N/A N/A Exudate Type: red, brown N/A N/A Exudate Color: Distinct, outline attached N/A N/A Wound Margin: None Present (0%) N/A N/A Granulation A mount: Large (67-100%) N/A N/A Necrotic A mount: Eschar N/A N/A Necrotic Tissue: None N/A N/A Epithelialization: Debridement - Excisional N/A N/A Debridement: Pre-procedure Verification/Time Out 10:00 N/A N/A Taken: Lidocaine N/A N/A Pain Control: Subcutaneous, Slough N/A N/A Tissue Debrided: Skin/Subcutaneous Tissue N/A N/A Level: 3.06 N/A N/A Debridement A (sq cm): rea Curette N/A N/A Instrument: Minimum N/A N/A Bleeding: Pressure N/A N/A Hemostasis A chieved: 0 N/A N/A Procedural Pain: 0 N/A N/A Post Procedural Pain: Procedure was tolerated well N/A N/A Debridement Treatment Response: 1.8x1.7x0.2 N/A N/A Post Debridement Measurements L x W x D (cm) 0.481 N/A N/A Post Debridement Volume: (cm) Debridement N/A N/A Procedures Performed: Treatment Notes Wound #1 (Lower Leg) Wound Laterality: Left, Posterior Cleanser Soap and Water Discharge Instruction: May shower and wash wound with dial antibacterial soap and  water prior to dressing change. Wound Cleanser Discharge Instruction: Cleanse the wound with Dakin's cleaner. Peri-Wound Care Topical Primary Dressing Hydrofera Blue Ready Foam, 2.5 x2.5 in Discharge Instruction: Apply to wound bed as instructed Santyl Ointment Discharge Instruction: Apply nickel thick amount to wound bed as instructed Secondary Dressing ABD Pad, 5x9 Discharge Instruction: Apply over primary dressing as directed. Woven Gauze Sponge, Non-Sterile 4x4 in Discharge Instruction: Apply over primary dressing as directed. Secured With Compression Wrap Kerlix Roll 4.5x3.1 (in/yd) Discharge Instruction: Apply Kerlix and Coban compression as directed. Coban Self-Adherent Wrap 4x5 (in/yd) Discharge Instruction: Apply over Kerlix as directed. Compression Stockings Add-Ons Electronic Signature(s) Signed: 04/10/2022 10:50:35 AM By: Kalman Shan DO Signed: 04/12/2022 5:29:07 PM By: Rhae Hammock RN Entered By: Kalman Shan on 04/10/2022 10:43:27 -------------------------------------------------------------------------------- Multi-Disciplinary Care Plan Details Patient Name: Date of Service: Jocelyn Kaufman, Jocelyn Kaufman 04/10/2022 9:15 A M Medical Record Number: [id number] Patient Account Number: [id number] Date of Birth/Sex: Treating RN: Dec 19, 1955 (66 y.o. Tonita Phoenix, Lauren Primary Care Zaden Sako: Deland Pretty Other Clinician: Referring Simaya Lumadue: Treating Maddix Heinz/Extender: Judie Grieve in Treatment: 3 Active Inactive Orientation to the Wound Care Program Nursing Diagnoses: Knowledge deficit related to the wound healing center program Goals: Patient/caregiver will verbalize understanding of the [place] Date Initiated: 03/20/2022 Target Resolution Date: 04/28/2022 Goal Status: Active Interventions: Provide education on orientation to the wound center Notes: Wound/Skin Impairment Nursing Diagnoses: Impaired tissue  integrity Knowledge deficit related to ulceration/compromised skin integrity Goals: Patient will have a decrease in wound volume by X% from date: (specify in notes) Date Initiated: 03/20/2022 Target Resolution Date: 04/28/2022 Goal Status: Active Patient/caregiver will verbalize understanding of skin care regimen Date Initiated: 03/20/2022 Target Resolution Date: 04/29/2022 Goal Status: Active Ulcer/skin breakdown will have a volume reduction of 30% by week 4 Date Initiated: 03/20/2022 Target Resolution Date: 05/08/2022 Goal Status: Active Interventions: Assess patient/caregiver ability to obtain necessary supplies Assess patient/caregiver ability to perform ulcer/skin care regimen upon admission and as needed Assess ulceration(s) every visit Notes: Electronic Signature(s) Signed: 04/12/2022 5:29:07 PM By: Rhae Hammock RN Entered By: Rhae Hammock on 04/10/2022 10:05:27 -------------------------------------------------------------------------------- Pain Assessment Details Patient Name: Date of Service: Jocelyn Kaufman 04/10/2022 9:15 A M Medical Record Number: [id number] Patient Account Number: [id number] Date of Birth/Sex: Treating RN: 02-Oct-1956 (66 y.o. Tonita Phoenix, Lauren Primary Care Adam Demary: Deland Pretty Other Clinician: Referring Miriah Maruyama: Treating Maija Biggers/Extender: Hardie Pulley  Weeks in Treatment: 3 Active Problems Location of Pain Severity and Description of Pain Patient Has Paino Yes Site Locations Pain Location: Pain in Ulcers Rate the pain. Current Pain Level: 4 Pain Management and Medication Current Pain Management: Electronic Signature(s) Signed: 04/11/2022 8:50:49 AM By: Erenest Blank Signed: 04/12/2022 5:29:07 PM By: Rhae Hammock RN Entered By: Erenest Blank on 04/10/2022 09:41:44 -------------------------------------------------------------------------------- Patient/Caregiver Education Details Patient Name: Date of  Service: Jocelyn Kaufman, Jocelyn Kaufman 6/13/2023andnbsp9:15 [place] Record Number: [id number] Patient Account Number: [id number] Date of Birth/Gender: Treating RN: [birth year] (66 y.o. Tonita Phoenix, Lauren Primary Care Physician: Deland Pretty Other Clinician: Referring Physician: Treating Physician/Extender: Judie Grieve in Treatment: 3 Education Assessment Education Provided To: Patient Education Topics Provided Basic Hygiene: Methods: Explain/Verbal Responses: Reinforcements needed, State content correctly Electronic Signature(s) Signed: 04/12/2022 5:29:07 PM By: Rhae Hammock RN Entered By: Rhae Hammock on 04/10/2022 10:06:05 -------------------------------------------------------------------------------- Wound Assessment Details Patient Name: Date of Service: Jocelyn Kaufman 04/10/2022 9:15 A M Medical Record Number: [id number] Patient Account Number: [id number] Date of Birth/Sex: Treating RN: 05-19-1956 (66 y.o. Tonita Phoenix, Lauren Primary Care Elizardo Chilson: Deland Pretty Other Clinician: Referring Alexandros Ewan: Treating Edson Deridder/Extender: Judie Grieve in Treatment: 3 Wound Status Wound Number: 1 Primary Etiology: Open Surgical Wound Wound Location: Left, Posterior Lower Leg Wound Status: Open Wounding Event: Gradually Appeared Comorbid History: Anemia, Asthma, Type II Diabetes, Rheumatoid Arthritis Date Acquired: 01/26/2022 Weeks Of Treatment: 3 Clustered Wound: No Photos Wound Measurements Length: (cm) 1.8 Width: (cm) 1.7 Depth: (cm) 0.2 Area: (cm) 2.403 Volume: (cm) 0.481 % Reduction in Area: 59.2% % Reduction in Volume: 72.8% Epithelialization: None Tunneling: No Undermining: No Wound Description Classification: Unclassifiable Wound Margin: Distinct, outline attached Exudate Amount: Medium Exudate Type: Serosanguineous Exudate Color: red, brown Foul Odor After Cleansing: No Slough/Fibrino Yes Wound  Bed Granulation Amount: None Present (0%) Necrotic Amount: Large (67-100%) Necrotic Quality: Eschar Treatment Notes Wound #1 (Lower Leg) Wound Laterality: Left, Posterior Cleanser Soap and Water Discharge Instruction: May shower and wash wound with dial antibacterial soap and water prior to dressing change. Wound Cleanser Discharge Instruction: Cleanse the wound with Dakin's cleaner. Peri-Wound Care Topical Primary Dressing Hydrofera Blue Ready Foam, 2.5 x2.5 in Discharge Instruction: Apply to wound bed as instructed Santyl Ointment Discharge Instruction: Apply nickel thick amount to wound bed as instructed Secondary Dressing ABD Pad, 5x9 Discharge Instruction: Apply over primary dressing as directed. Woven Gauze Sponge, Non-Sterile 4x4 in Discharge Instruction: Apply over primary dressing as directed. Secured With Compression Wrap Kerlix Roll 4.5x3.1 (in/yd) Discharge Instruction: Apply Kerlix and Coban compression as directed. Coban Self-Adherent Wrap 4x5 (in/yd) Discharge Instruction: Apply over Kerlix as directed. Compression Stockings Add-Ons Electronic Signature(s) Signed: 04/11/2022 8:50:49 AM By: Erenest Blank Signed: 04/12/2022 5:29:07 PM By: Rhae Hammock RN Entered By: Erenest Blank on 04/10/2022 09:53:16 -------------------------------------------------------------------------------- Vitals Details Patient Name: Date of Service: Jocelyn Kaufman, Jocelyn Kaufman 04/10/2022 9:15 A M Medical Record Number: [id number] Patient Account Number: [id number] Date of Birth/Sex: Treating RN: 1956-10-14 (66 y.o. Tonita Phoenix, Lauren Primary Care Gayatri Teasdale: Deland Pretty Other Clinician: Referring Kanani Mowbray: Treating Warnie Belair/Extender: Judie Grieve in Treatment: 3 Vital Signs Time Taken: 09:41 Temperature (F): 98.9 Height (in): 64 Pulse (bpm): 69 Weight (lbs): 237 Respiratory Rate (breaths/min): 18 Body Mass Index (BMI): 40.7 Blood Pressure (mmHg):  134/73 Reference Range: 80 - 120 mg / dl Electronic Signature(s) Signed: 04/11/2022 8:50:49 AM By: Erenest Blank Entered By: Erenest Blank on 04/10/2022 09:41:31

## 2022-04-16 DIAGNOSIS — Z1231 Encounter for screening mammogram for malignant neoplasm of breast: Secondary | ICD-10-CM | POA: Diagnosis not present

## 2022-04-16 DIAGNOSIS — B3731 Acute candidiasis of vulva and vagina: Secondary | ICD-10-CM | POA: Diagnosis not present

## 2022-04-16 DIAGNOSIS — Z01419 Encounter for gynecological examination (general) (routine) without abnormal findings: Secondary | ICD-10-CM | POA: Diagnosis not present

## 2022-04-17 ENCOUNTER — Encounter (HOSPITAL_BASED_OUTPATIENT_CLINIC_OR_DEPARTMENT_OTHER): Payer: Medicare PPO | Admitting: Internal Medicine

## 2022-04-17 DIAGNOSIS — L814 Other melanin hyperpigmentation: Secondary | ICD-10-CM | POA: Diagnosis not present

## 2022-04-17 DIAGNOSIS — E11622 Type 2 diabetes mellitus with other skin ulcer: Secondary | ICD-10-CM

## 2022-04-17 DIAGNOSIS — M069 Rheumatoid arthritis, unspecified: Secondary | ICD-10-CM

## 2022-04-17 DIAGNOSIS — L97822 Non-pressure chronic ulcer of other part of left lower leg with fat layer exposed: Secondary | ICD-10-CM

## 2022-04-17 DIAGNOSIS — Z7985 Long-term (current) use of injectable non-insulin antidiabetic drugs: Secondary | ICD-10-CM | POA: Diagnosis not present

## 2022-04-17 DIAGNOSIS — T8131XA Disruption of external operation (surgical) wound, not elsewhere classified, initial encounter: Secondary | ICD-10-CM

## 2022-04-17 DIAGNOSIS — E039 Hypothyroidism, unspecified: Secondary | ICD-10-CM | POA: Diagnosis not present

## 2022-04-18 NOTE — Progress Notes (Signed)
ADAMARI, FREDE ([id number]) Visit Report for 04/17/2022 Arrival Information Details Patient Name: Date of Service: LAURELIN, ELSON 04/17/2022 10:45 A M Medical Record Number: [id number] Patient Account Number: [id number] Date of Birth/Sex: Treating RN: 1955/11/13 (66 y.o. Tonita Phoenix, Lauren Primary Care Danija Gosa: Deland Pretty Other Clinician: Referring Lorilyn Laitinen: Treating Marice Guidone/Extender: Judie Grieve in Treatment: 4 Visit Information History Since Last Visit Added or deleted any medications: No Patient Arrived: Ambulatory Any new allergies or adverse reactions: No Arrival Time: 10:58 Had a fall or experienced change in No Accompanied By: self activities of daily living that may affect Transfer Assistance: Manual risk of falls: Patient Identification Verified: Yes Signs or symptoms of abuse/neglect since last visito No Secondary Verification Process Completed: Yes Hospitalized since last visit: No Patient Has Alerts: Yes Implantable device outside of the clinic excluding No Patient Alerts: *Prefers left arm for BP cellular tissue based products placed in the center since last visit: Has Dressing in Place as Prescribed: Yes Has Compression in Place as Prescribed: Yes Pain Present Now: Yes Electronic Signature(s) Signed: 04/18/2022 4:06:51 PM By: Rhae Hammock RN Entered By: Rhae Hammock on 04/17/2022 10:58:28 -------------------------------------------------------------------------------- Clinic Level of Care Assessment Details Patient Name: Date of Service: JANELLE, SPELLMAN 04/17/2022 10:45 A M Medical Record Number: [id number] Patient Account Number: [id number] Date of Birth/Sex: Treating RN: 1956/10/22 (66 y.o. Tonita Phoenix, Lauren Primary Care Jsaon Yoo: Deland Pretty Other Clinician: Referring Duc Crocket: Treating Carol Loftin/Extender: Judie Grieve in Treatment: 4 Clinic Level of Care Assessment Items TOOL 4 Quantity  Score X- 1 0 Use when only an EandM is performed on FOLLOW-UP visit ASSESSMENTS - Nursing Assessment / Reassessment X- 1 10 Reassessment of Co-morbidities (includes updates in patient status) X- 1 5 Reassessment of Adherence to Treatment Plan ASSESSMENTS - Wound and Skin A ssessment / Reassessment X - Simple Wound Assessment / Reassessment - one wound 1 5 '[]'$  - 0 Complex Wound Assessment / Reassessment - multiple wounds '[]'$  - 0 Dermatologic / Skin Assessment (not related to wound area) ASSESSMENTS - Focused Assessment X- 1 5 Circumferential Edema Measurements - multi extremities '[]'$  - 0 Nutritional Assessment / Counseling / Intervention '[]'$  - 0 Lower Extremity Assessment (monofilament, tuning fork, pulses) '[]'$  - 0 Peripheral Arterial Disease Assessment (using hand held doppler) ASSESSMENTS - Ostomy and/or Continence Assessment and Care '[]'$  - 0 Incontinence Assessment and Management '[]'$  - 0 Ostomy Care Assessment and Management (repouching, etc.) PROCESS - Coordination of Care X - Simple Patient / Family Education for ongoing care 1 15 '[]'$  - 0 Complex (extensive) Patient / Family Education for ongoing care X- 1 10 Staff obtains Programmer, systems, Records, T Results / Process Orders est '[]'$  - 0 Staff telephones HHA, Nursing Homes / Clarify orders / etc '[]'$  - 0 Routine Transfer to another Facility (non-emergent condition) '[]'$  - 0 Routine Hospital Admission (non-emergent condition) '[]'$  - 0 New Admissions / Biomedical engineer / Ordering NPWT Apligraf, etc. , '[]'$  - 0 Emergency Hospital Admission (emergent condition) X- 1 10 Simple Discharge Coordination '[]'$  - 0 Complex (extensive) Discharge Coordination PROCESS - Special Needs '[]'$  - 0 Pediatric / Minor Patient Management '[]'$  - 0 Isolation Patient Management '[]'$  - 0 Hearing / Language / Visual special needs '[]'$  - 0 Assessment of Community assistance (transportation, D/C planning, etc.) '[]'$  - 0 Additional assistance / Altered  mentation '[]'$  - 0 Support Surface(s) Assessment (bed, cushion, seat, etc.) INTERVENTIONS - Wound Cleansing / Measurement X - Simple Wound Cleansing - one wound 1  5 '[]'$  - 0 Complex Wound Cleansing - multiple wounds X- 1 5 Wound Imaging (photographs - any number of wounds) '[]'$  - 0 Wound Tracing (instead of photographs) X- 1 5 Simple Wound Measurement - one wound '[]'$  - 0 Complex Wound Measurement - multiple wounds INTERVENTIONS - Wound Dressings '[]'$  - 0 Small Wound Dressing one or multiple wounds '[]'$  - 0 Medium Wound Dressing one or multiple wounds '[]'$  - 0 Large Wound Dressing one or multiple wounds '[]'$  - 0 Application of Medications - topical '[]'$  - 0 Application of Medications - injection INTERVENTIONS - Miscellaneous '[]'$  - 0 External ear exam '[]'$  - 0 Specimen Collection (cultures, biopsies, blood, body fluids, etc.) '[]'$  - 0 Specimen(s) / Culture(s) sent or taken to Lab for analysis '[]'$  - 0 Patient Transfer (multiple staff / Civil Service fast streamer / Similar devices) '[]'$  - 0 Simple Staple / Suture removal (25 or less) '[]'$  - 0 Complex Staple / Suture removal (26 or more) '[]'$  - 0 Hypo / Hyperglycemic Management (close monitor of Blood Glucose) '[]'$  - 0 Ankle / Brachial Index (ABI) - do not check if billed separately X- 1 5 Vital Signs Has the patient been seen at the hospital within the last three years: Yes Total Score: 80 Level Of Care: New/Established - Level 3 Electronic Signature(s) Signed: 04/18/2022 4:06:51 PM By: Rhae Hammock RN Entered By: Rhae Hammock on 04/17/2022 11:24:23 -------------------------------------------------------------------------------- Encounter Discharge Information Details Patient Name: Date of Service: [name] 04/17/2022 10:45 A M Medical Record Number: [id number] Patient Account Number: [id number] Date of Birth/Sex: Treating RN: 1956/03/22 (66 y.o. Tonita Phoenix, Lauren Primary Care Saje Gallop: Deland Pretty Other Clinician: Referring  Earl Zellmer: Treating Leilany Digeronimo/Extender: Judie Grieve in Treatment: 4 Encounter Discharge Information Items Discharge Condition: Stable Ambulatory Status: Ambulatory Discharge Destination: Home Transportation: Private Auto Accompanied By: self Schedule Follow-up Appointment: Yes Clinical Summary of Care: Patient Declined Electronic Signature(s) Signed: 04/18/2022 4:06:51 PM By: Rhae Hammock RN Entered By: Rhae Hammock on 04/17/2022 11:25:00 -------------------------------------------------------------------------------- Lower Extremity Assessment Details Patient Name: Date of Service: ALPHIA, BEHANNA 04/17/2022 10:45 A M Medical Record Number: [id number] Patient Account Number: [id number] Date of Birth/Sex: Treating RN: 07-23-1956 (66 y.o. Tonita Phoenix, Lauren Primary Care Tag Wurtz: Deland Pretty Other Clinician: Referring Aqua Denslow: Treating Arlette Schaad/Extender: Judie Grieve in Treatment: 4 Edema Assessment Assessed: Shirlyn Goltz: Yes] Patrice Paradise: No] Edema: [Left: Ye] [Right: s] Calf Left: Right: Point of Measurement: 30 cm From Medial Instep 44.4 cm Ankle Left: Right: Point of Measurement: 7 cm From Medial Instep 24.3 cm Vascular Assessment Pulses: Dorsalis Pedis Palpable: [Left:Yes] Posterior Tibial Palpable: [Left:Yes] Electronic Signature(s) Signed: 04/18/2022 4:06:51 PM By: Rhae Hammock RN Entered By: Rhae Hammock on 04/17/2022 11:07:13 -------------------------------------------------------------------------------- Multi-Disciplinary Care Plan Details Patient Name: Date of Service: [name] 04/17/2022 10:45 A M Medical Record Number: [id number] Patient Account Number: [id number] Date of Birth/Sex: Treating RN: 06/11/1956 (66 y.o. Tonita Phoenix, Lauren Primary Care Travin Marik: Deland Pretty Other Clinician: Referring Lukka Black: Treating Ha Placeres/Extender: Judie Grieve in Treatment:  4 Active Inactive Electronic Signature(s) Signed: 04/18/2022 4:06:51 PM By: Rhae Hammock RN Entered By: Rhae Hammock on 04/17/2022 11:23:42 -------------------------------------------------------------------------------- Pain Assessment Details Patient Name: Date of Service: REKISHA, WELLING 04/17/2022 10:45 A M Medical Record Number: [id number] Patient Account Number: [id number] Date of Birth/Sex: Treating RN: Jun 10, 1956 (66 y.o. Benjaman Lobe Primary Care Najee Manninen: Deland Pretty Other Clinician: Referring Areatha Kalata: Treating Chad Donoghue/Extender: Judie Grieve in Treatment: 4 Active Problems Location of Pain Severity  and Description of Pain Patient Has Paino No Site Locations Pain Management and Medication Current Pain Management: Electronic Signature(s) Signed: 04/18/2022 4:06:51 PM By: Rhae Hammock RN Entered By: Rhae Hammock on 04/17/2022 11:02:19 -------------------------------------------------------------------------------- Patient/Caregiver Education Details Patient Name: Date of Service: [name] 6/20/2023andnbsp10:45 [place] Record Number: [id number] Patient Account Number: [id number] Date of Birth/Gender: Treating RN: 06/22/1956 (66 y.o. Tonita Phoenix, Lauren Primary Care Physician: Deland Pretty Other Clinician: Referring Physician: Treating Physician/Extender: Judie Grieve in Treatment: 4 Education Assessment Education Provided To: Patient Education Topics Provided Wound/Skin Impairment: Methods: Explain/Verbal Responses: Reinforcements needed, State content correctly Electronic Signature(s) Signed: 04/18/2022 4:06:51 PM By: Rhae Hammock RN Entered By: Rhae Hammock on 04/17/2022 11:23:56 -------------------------------------------------------------------------------- Wound Assessment Details Patient Name: Date of Service: BURDETTE, GERGELY 04/17/2022 10:45 A M Medical  Record Number: [id number] Patient Account Number: [id number] Date of Birth/Sex: Treating RN: 1956-01-20 (66 y.o. Tonita Phoenix, Lauren Primary Care Keaten Mashek: Deland Pretty Other Clinician: Referring Tevan Marian: Treating Gema Ringold/Extender: Judie Grieve in Treatment: 4 Wound Status Wound Number: 1 Primary Etiology: Open Surgical Wound Wound Location: Left, Posterior Lower Leg Wound Status: Open Wounding Event: Gradually Appeared Comorbid History: Anemia, Asthma, Type II Diabetes, Rheumatoid Arthritis Date Acquired: 01/26/2022 Weeks Of Treatment: 4 Clustered Wound: No Photos Wound Measurements Length: (cm) Width: (cm) Depth: (cm) Area: (cm) Volume: (cm) 0 % Reduction in Area: 100% 0 % Reduction in Volume: 100% 0 0 0 Wound Description Classification: Full Thickness With Exposed Support Structures Electronic Signature(s) Signed: 04/18/2022 4:06:51 PM By: Rhae Hammock RN Entered By: Rhae Hammock on 04/17/2022 11:20:57 -------------------------------------------------------------------------------- Vitals Details Patient Name: Date of Service: [name] 04/17/2022 10:45 A M Medical Record Number: [id number] Patient Account Number: [id number] Date of Birth/Sex: Treating RN: 22-Mar-1956 (66 y.o. Tonita Phoenix, Lauren Primary Care Lambros Cerro: Deland Pretty Other Clinician: Referring Shephanie Romas: Treating Destiny Hagin/Extender: Judie Grieve in Treatment: 4 Vital Signs Time Taken: 10:58 Temperature (F): 99.2 Height (in): 64 Pulse (bpm): 74 Weight (lbs): 237 Respiratory Rate (breaths/min): 17 Body Mass Index (BMI): 40.7 Blood Pressure (mmHg): 119/66 Reference Range: 80 - 120 mg / dl Electronic Signature(s) Signed: 04/18/2022 4:06:51 PM By: Rhae Hammock RN Entered By: Rhae Hammock on 04/17/2022 11:00:22

## 2022-04-24 DIAGNOSIS — I10 Essential (primary) hypertension: Secondary | ICD-10-CM | POA: Diagnosis not present

## 2022-04-24 DIAGNOSIS — F411 Generalized anxiety disorder: Secondary | ICD-10-CM | POA: Diagnosis not present

## 2022-04-24 DIAGNOSIS — M85852 Other specified disorders of bone density and structure, left thigh: Secondary | ICD-10-CM | POA: Diagnosis not present

## 2022-04-24 DIAGNOSIS — E785 Hyperlipidemia, unspecified: Secondary | ICD-10-CM | POA: Diagnosis not present

## 2022-04-24 DIAGNOSIS — E11319 Type 2 diabetes mellitus with unspecified diabetic retinopathy without macular edema: Secondary | ICD-10-CM | POA: Diagnosis not present

## 2022-04-24 DIAGNOSIS — E1121 Type 2 diabetes mellitus with diabetic nephropathy: Secondary | ICD-10-CM | POA: Diagnosis not present

## 2022-04-24 DIAGNOSIS — M85851 Other specified disorders of bone density and structure, right thigh: Secondary | ICD-10-CM | POA: Diagnosis not present

## 2022-04-24 DIAGNOSIS — M8589 Other specified disorders of bone density and structure, multiple sites: Secondary | ICD-10-CM | POA: Diagnosis not present

## 2022-04-26 ENCOUNTER — Other Ambulatory Visit: Payer: Self-pay

## 2022-04-26 ENCOUNTER — Inpatient Hospital Stay: Payer: Medicare PPO

## 2022-04-26 VITALS — BP 134/57 | HR 66 | Temp 98.8°F | Resp 18

## 2022-04-26 DIAGNOSIS — N183 Chronic kidney disease, stage 3 unspecified: Secondary | ICD-10-CM

## 2022-04-26 DIAGNOSIS — D631 Anemia in chronic kidney disease: Secondary | ICD-10-CM | POA: Diagnosis not present

## 2022-04-26 DIAGNOSIS — D638 Anemia in other chronic diseases classified elsewhere: Secondary | ICD-10-CM

## 2022-04-26 DIAGNOSIS — D649 Anemia, unspecified: Secondary | ICD-10-CM

## 2022-04-26 LAB — CBC WITH DIFFERENTIAL (CANCER CENTER ONLY)
Abs Immature Granulocytes: 0.02 10*3/uL (ref 0.00–0.07)
Basophils Absolute: 0.1 10*3/uL (ref 0.0–0.1)
Basophils Relative: 1 %
Eosinophils Absolute: 0.2 10*3/uL (ref 0.0–0.5)
Eosinophils Relative: 3 %
HCT: 31.9 % — ABNORMAL LOW (ref 36.0–46.0)
Hemoglobin: 9.7 g/dL — ABNORMAL LOW (ref 12.0–15.0)
Immature Granulocytes: 0 %
Lymphocytes Relative: 29 %
Lymphs Abs: 1.9 10*3/uL (ref 0.7–4.0)
MCH: 27.3 pg (ref 26.0–34.0)
MCHC: 30.4 g/dL (ref 30.0–36.0)
MCV: 89.9 fL (ref 80.0–100.0)
Monocytes Absolute: 0.4 10*3/uL (ref 0.1–1.0)
Monocytes Relative: 7 %
Neutro Abs: 4.1 10*3/uL (ref 1.7–7.7)
Neutrophils Relative %: 60 %
Platelet Count: 170 10*3/uL (ref 150–400)
RBC: 3.55 MIL/uL — ABNORMAL LOW (ref 3.87–5.11)
RDW: 14.5 % (ref 11.5–15.5)
WBC Count: 6.8 10*3/uL (ref 4.0–10.5)
nRBC: 0 % (ref 0.0–0.2)

## 2022-04-26 MED ORDER — EPOETIN ALFA-EPBX 40000 UNIT/ML IJ SOLN
40000.0000 [IU] | Freq: Once | INTRAMUSCULAR | Status: AC
  Administered 2022-04-26: 40000 [IU] via SUBCUTANEOUS
  Filled 2022-04-26: qty 1

## 2022-05-01 ENCOUNTER — Encounter: Payer: Self-pay | Admitting: Neurology

## 2022-05-02 ENCOUNTER — Encounter: Payer: Self-pay | Admitting: Neurology

## 2022-05-03 NOTE — Telephone Encounter (Signed)
I reviewed patient's compliance download for her CPAP from 04/02/2022 through 05/01/2022, which is a total of 30 days, during which time she used her machine 29 days with percent use days greater than 4 hours at 93%, indicating excellent compliance with an average usage of 6 hours and 47 minutes, residual AHI borderline at 5.5/h, much better compared to a month ago, pressure at 11 cm which we increased from 10 cm, leak acceptable with the 95th percentile at 16 L/min, slightly higher compared to before.  Please advise patient that her compliance is excellent, leak is acceptable from the mask, apnea scores much better on the slightly higher pressure of 11 cm compared to 10 cm before.  I would like to maintain her on the settings.  She can follow-up as scheduled with Megan.

## 2022-05-06 NOTE — Progress Notes (Unsigned)
Office Visit    Patient Name: Jocelyn Kaufman Date of Encounter: 05/06/2022  PCP:  Deland Pretty, MD   [facility]  Cardiologist:  Pixie Casino, MD  Advanced Practice Provider:  No care team member to display Electrophysiologist:  None    Chief Complaint    Jocelyn Kaufman is a 66 y.o. 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   Urticaria    Past Surgical History:  Procedure Laterality Date   ADENOIDECTOMY     APPENDECTOMY     CYST REMOVAL LEG Right    Removed from right thigh joint   ORIF HUMERUS FRACTURE Right 07/09/2018   Procedure: OPEN REDUCTION INTERNAL FIXATION (ORIF) HUMERAL SHAFT FRACTURE;  Surgeon: Hiram Gash, MD;  Location: [place];  Service: Orthopedics;  Laterality: Right;   TONSILLECTOMY     TOTAL VAGINAL HYSTERECTOMY     UMBILICAL HERNIA REPAIR      Allergies  Allergies  Allergen Reactions   Ozempic (0.25 Or 0.5 Mg-Dose) [Semaglutide(0.25 Or 0.'5mg'$ -Dos)] Other (See Comments)    Cataracts requiring surgery and impairing vision   Jardiance [Empagliflozin] Itching and Swelling   Latex    Tramadol Hcl Other (See Comments)   Invokana [Canagliflozin] Other (See Comments)    Urinary incontinence   Percocet [Oxycodone-Acetaminophen] Nausea And Vomiting   Prilosec [Omeprazole] Rash   Rogaine [Minoxidil] Rash   Victoza [Liraglutide] Itching    History of Present Illness    Jocelyn Kaufman is a 66 y.o. 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  Last seen 04/2021. Doing overall well with excpetion of one episode of chest discomfort that was atypical for angina as occurred at rest and self resolved. In occurred after a fall in her yard and was thought to be musculoskeletal in natrua. No ischemic evaluation recommended.   She presents today for follow up. ***  She presents today for follow up. Reports no shortness of breath nor dyspnea on exertion.  No edema, orthopnea, PND. Reports no palpitations.  Tells me she will have an episode of chest pain approximately once every 2 months and these episodes are overall sporadic.  Most recent episode Sunday which lasted about 2 hours and was intermittent.  Chest pain occurred to the right of the manubrium, no alleviating or aggravating factors.  She does note that she has recently been working in the yard getting ready (fell during a storm and had no chest pain, pressure, tightness at that time.  We discussed that her chest pain is atypical for angina as it occurred at rest and may reflect musculoskeletal etiology.  ***  EKGs/Labs/Other Studies Reviewed:   The following studies were reviewed today:  Echo 06/2018 Left ventricle: The cavity size was normal. There was mild    concentric hypertrophy. Systolic function was normal. The    estimated ejection fraction was in the range of 60% to 65%. Wall    motion was normal; there were no regional wall motion    abnormalities. Doppler parameters are consistent with abnormal    left ventricular relaxation (grade 1 diastolic dysfunction).    Doppler parameters are consistent with high ventricular filling    pressure.  - Aortic valve: Transvalvular velocity was within the normal range.    There was no stenosis. There was no regurgitation.   - Mitral valve: Calcified annulus. Transvalvular velocity was    within the normal range. There was no evidence for stenosis.    There was no regurgitation.  - Left atrium: The atrium was mildly dilated.  - Right ventricle: The cavity size was normal. Wall thickness was    normal. Systolic function was normal.  - Tricuspid valve: There was trivial regurgitation.  - Pulmonary arteries: Systolic pressure was within the normal    range.  - Pericardium, extracardiac: A trivial pericardial effusion was    identified.   Myoview 09/2018 Nuclear stress EF: 57%. There was no ST segment deviation noted during stress. The study is normal. The left ventricular ejection fraction is normal (55-65%).   1. EF 57%, normal wall motion. 2. No evidence for ischemia or infarction on perfusion images.   Normal study.   EKG:  EKG is  ordered today.  The ekg ordered today demonstrates NSR 76 bpm with no acute ST/T wave changes.***  Recent Labs: 04/26/2022: Hemoglobin 9.7; Platelet Count 170  Recent Lipid Panel    Component Value Date/Time   CHOL 221 (H) 07/07/2018 0514   TRIG 266 (H) 07/07/2018 0514   HDL 42 07/07/2018 0514   CHOLHDL 5.3 07/07/2018 0514   VLDL 53 (H) 07/07/2018 0514   LDLCALC 126 (H) 07/07/2018 0514    Home Medications   No outpatient medications have been marked as taking for the 05/07/22 encounter (Appointment) with Loel Dubonnet, NP.     Review of Systems      All other systems reviewed and are otherwise negative except as noted above.  Physical Exam    VS:  There were no vitals taken for this visit. , BMI There is no height or weight on file to calculate BMI.  Wt Readings from Last 3 Encounters:  04/05/22 241 lb 6 oz (109.5 kg)  03/29/22 245 lb (111.1 kg)  02/01/22 240 lb 9.6 oz (109.1 kg)   *** GEN: Well nourished, overweight,  well developed, in no acute distress. HEENT: normal. Neck: Supple, no JVD, carotid bruits, or masses. Cardiac: RRR, no murmurs,  rubs, or gallops. No clubbing, cyanosis, edema.  Radials/PT 2+ and equal bilaterally.  Respiratory:  Respirations regular and unlabored, clear to auscultation bilaterally. GI: Soft, nontender, nondistended. MS: No deformity or atrophy. Skin: Warm and dry, no rash. Neuro:  Strength and sensation are intact. Psych: Normal affect.  Assessment & Plan    NSVT / PVC - Low risk myoview 09/2018. Well controlled on Toprol '25mg'$  QD. Refill provided.***  Atypical chest pain - Chest pain episode Sunday at rest which was intermittent for 2 hours. Self resolved. EKG today NSR with no acute ST/T wave changes. Low suspicion angina as symptoms are atypical. We agreed to defer ischemic evaluation at this time. She will contact our office if chest pain recurs.***  DM2 - Continue to  follow with PCP. ***  OSA - CPAP compliance encouraged. ***  Disposition: Follow up in 1 year(s) with Dr. Debara Pickett or APP.  Signed, Loel Dubonnet, NP 05/06/2022, 5:49 PM [place]

## 2022-05-07 ENCOUNTER — Encounter (HOSPITAL_BASED_OUTPATIENT_CLINIC_OR_DEPARTMENT_OTHER): Payer: Self-pay | Admitting: Family

## 2022-05-07 ENCOUNTER — Ambulatory Visit (HOSPITAL_BASED_OUTPATIENT_CLINIC_OR_DEPARTMENT_OTHER): Payer: Medicare PPO | Admitting: Family

## 2022-05-07 VITALS — BP 134/68 | HR 70 | Ht 64.5 in | Wt 239.0 lb

## 2022-05-07 DIAGNOSIS — Z6841 Body Mass Index (BMI) 40.0 and over, adult: Secondary | ICD-10-CM

## 2022-05-07 DIAGNOSIS — I4729 Other ventricular tachycardia: Secondary | ICD-10-CM | POA: Diagnosis not present

## 2022-05-07 DIAGNOSIS — R0609 Other forms of dyspnea: Secondary | ICD-10-CM | POA: Diagnosis not present

## 2022-05-07 DIAGNOSIS — D638 Anemia in other chronic diseases classified elsewhere: Secondary | ICD-10-CM

## 2022-05-07 DIAGNOSIS — I10 Essential (primary) hypertension: Secondary | ICD-10-CM

## 2022-05-07 MED ORDER — METOPROLOL SUCCINATE ER 25 MG PO TB24: 25.0000 mg | ORAL_TABLET | Freq: Every day | ORAL | 3 refills | Status: DC

## 2022-05-07 NOTE — Patient Instructions (Addendum)
Medication Instructions:  Your Physician recommend you continue on your current medication as directed.    You may take an extra half tablet of metoprolol as needed for break through palpitations.   *If you need a refill on your cardiac medications before your next appointment, please call your pharmacy*   Lab Work: None ordered today   Testing/Procedures: Your physician has requested that you have an echocardiogram. Echocardiography is a painless test that uses sound waves to create images of your heart. It provides your doctor with information about the size and shape of your heart and how well your heart's chambers and valves are working. This procedure takes approximately one hour. There are no restrictions for this procedure. [place], you and your health needs are our priority.  As part of our continuing mission to provide you with exceptional heart care, we have created designated Provider Care Teams.  These Care Teams include your primary Cardiologist (physician) and Advanced Practice Providers (APPs -  Physician Assistants and Nurse Practitioners) who all work together to provide you with the care you need, when you need it.  We recommend signing up for the patient portal called "MyChart".  Sign up information is provided on this After Visit Summary.  MyChart is used to connect with patients for Virtual Visits (Telemedicine).  Patients are able to view lab/test results, encounter notes, upcoming appointments, etc.  Non-urgent messages can be sent to your provider as well.   To learn more about what you can do with MyChart, go to [email or web address].    Your next appointment:   6 month(s)  The format for your next appointment:   In Person  Provider:   K. Mali Hilty, MD{  Other Instructions Heart Healthy Diet Recommendations: A low-salt diet is recommended. Meats should be grilled, baked, or boiled. Avoid fried  foods. Focus on lean protein sources like fish or chicken with vegetables and fruits. The American Heart Association is a Microbiologist!  American Heart Association Diet and Lifeystyle Recommendations   Exercise recommendations: The American Heart Association recommends 150 minutes of moderate intensity exercise weekly. Try 30 minutes of moderate intensity exercise 4-5 times per week. This could include walking, jogging, or swimming.   Important Information About Sugar

## 2022-05-11 ENCOUNTER — Telehealth: Payer: Self-pay

## 2022-05-11 DIAGNOSIS — M0579 Rheumatoid arthritis with rheumatoid factor of multiple sites without organ or systems involvement: Secondary | ICD-10-CM | POA: Diagnosis not present

## 2022-05-11 NOTE — Telephone Encounter (Signed)
Call to pt reference PREP referral Explained program to pt, locations and next class start on 06/19/22 T/TH 230p-345p at [facility] to starting then.  Explained will call back closer to start of class to schedule intake

## 2022-05-15 ENCOUNTER — Ambulatory Visit (INDEPENDENT_AMBULATORY_CARE_PROVIDER_SITE_OTHER): Payer: Medicare PPO

## 2022-05-15 DIAGNOSIS — R0609 Other forms of dyspnea: Secondary | ICD-10-CM | POA: Diagnosis not present

## 2022-05-16 DIAGNOSIS — D649 Anemia, unspecified: Secondary | ICD-10-CM | POA: Diagnosis not present

## 2022-05-16 DIAGNOSIS — M1711 Unilateral primary osteoarthritis, right knee: Secondary | ICD-10-CM | POA: Diagnosis not present

## 2022-05-16 DIAGNOSIS — Z79899 Other long term (current) drug therapy: Secondary | ICD-10-CM | POA: Diagnosis not present

## 2022-05-16 DIAGNOSIS — M797 Fibromyalgia: Secondary | ICD-10-CM | POA: Diagnosis not present

## 2022-05-16 DIAGNOSIS — M79643 Pain in unspecified hand: Secondary | ICD-10-CM | POA: Diagnosis not present

## 2022-05-16 DIAGNOSIS — M25569 Pain in unspecified knee: Secondary | ICD-10-CM | POA: Diagnosis not present

## 2022-05-16 DIAGNOSIS — M0579 Rheumatoid arthritis with rheumatoid factor of multiple sites without organ or systems involvement: Secondary | ICD-10-CM | POA: Diagnosis not present

## 2022-05-16 DIAGNOSIS — M1990 Unspecified osteoarthritis, unspecified site: Secondary | ICD-10-CM | POA: Diagnosis not present

## 2022-05-17 DIAGNOSIS — M0579 Rheumatoid arthritis with rheumatoid factor of multiple sites without organ or systems involvement: Secondary | ICD-10-CM | POA: Diagnosis not present

## 2022-05-18 ENCOUNTER — Telehealth (HOSPITAL_BASED_OUTPATIENT_CLINIC_OR_DEPARTMENT_OTHER): Payer: Self-pay

## 2022-05-18 DIAGNOSIS — R0789 Other chest pain: Secondary | ICD-10-CM

## 2022-05-18 LAB — ECHOCARDIOGRAM COMPLETE
AR max vel: 1.67 cm2
AV Area VTI: 1.6 cm2
AV Area mean vel: 1.59 cm2
AV Mean grad: 6 mmHg
AV Peak grad: 10.9 mmHg
Ao pk vel: 1.65 m/s
Area-P 1/2: 4.6 cm2
MV M vel: 2.61 m/s
MV Peak grad: 27.2 mmHg
S' Lateral: 3.23 cm

## 2022-05-18 NOTE — Telephone Encounter (Addendum)
Seen by patient Jocelyn Kaufman on 05/18/2022  9:48 AM; Deloris Ping message sent to follow up on chest pain symptoms   ----- Message from Loel Dubonnet, NP sent at 05/18/2022  9:45 AM EDT ----- Echo with normal heart muscle function.  Bilateral atria moderately dilated.  Small amount of fluid around the heart which the body will self absorb.  Mitral valve with moderate to severe calcification but only mild leaking.  No significant abnormalities that would cause dyspnea or chest pain.  If still with significant chest discomfort, recommend Lexiscan Myoview.

## 2022-05-21 ENCOUNTER — Other Ambulatory Visit: Payer: Self-pay | Admitting: Pharmacist

## 2022-05-21 NOTE — Addendum Note (Signed)
Addended by: Gerald Stabs on: 05/21/2022 12:06 PM   Modules accepted: Orders

## 2022-05-21 NOTE — Addendum Note (Signed)
Addended by: Loel Dubonnet on: 05/21/2022 12:50 PM   Modules accepted: Orders

## 2022-05-22 DIAGNOSIS — M79642 Pain in left hand: Secondary | ICD-10-CM | POA: Diagnosis not present

## 2022-05-22 DIAGNOSIS — M79641 Pain in right hand: Secondary | ICD-10-CM | POA: Diagnosis not present

## 2022-05-23 NOTE — Progress Notes (Signed)
Patient Care Team: Deland Pretty, MD as PCP - General (Internal Medicine) Debara Pickett Nadean Corwin, MD as PCP - Cardiology (Cardiology) Nicholas Lose, MD as Consulting Physician (Hematology and Oncology) Lahoma Rocker, MD as Consulting Physician (Rheumatology) Madelin Rear, MD (Inactive) as Consulting Physician (Endocrinology) Marygrace Drought, MD as Consulting Physician (Ophthalmology) Claudia Desanctis, MD as Consulting Physician (Nephrology)  DIAGNOSIS: No diagnosis found.  SUMMARY OF ONCOLOGIC HISTORY: Oncology History   No history exists.    CHIEF COMPLIANT:  Follow-up of anemia of chronic kidney disease on Retacrit injections.  INTERVAL HISTORY: Jocelyn Kaufman is a 66 y.o. with above-mentioned history of anemia of chronic kidney disease. She presents to the clinic today for a follow-up.    ALLERGIES:  is allergic to ozempic (0.25 or 0.5 mg-dose) [semaglutide(0.25 or 0.30m-dos)], jardiance [empagliflozin], latex, tramadol hcl, invokana [canagliflozin], percocet [oxycodone-acetaminophen], prilosec [omeprazole], rogaine [minoxidil], and victoza [liraglutide].  MEDICATIONS:  Current Outpatient Medications  Medication Sig Dispense Refill   Abatacept (ORENCIA IV) Inject 1 Package into the vein every 30 (thirty) days. (unsure of dosage)     Accu-Chek Softclix Lancets lancets use to check blood sugar     acetaminophen (TYLENOL) 500 MG tablet Take 1,000 mg by mouth every 6 (six) hours as needed.     Aflibercept (EYLEA) 2 MG/0.05ML SOLN 2 mg by Intravitreal route every 3 (three) months.     albuterol (VENTOLIN HFA) 108 (90 Base) MCG/ACT inhaler Inhale two puffs every four to six hours as needed for cough or wheeze. 18 g 2   amLODipine (NORVASC) 10 MG tablet Take 1 tablet (10 mg total) by mouth daily.     aspirin EC 81 MG tablet Take 1 tablet (81 mg total) by mouth daily. Swallow whole. 30 tablet 11   atorvastatin (LIPITOR) 40 MG tablet Take 1 tablet (40 mg total) by mouth daily at 6 PM. 30  tablet 0   B-D ULTRAFINE III SHORT PEN 31G X 8 MM MISC SMARTSIG:1 Each SUB-Q Daily     Beclomethasone Dipropionate (QVAR IN) Inhale 2 puffs into the lungs 3 (three) times daily as needed.     CHELATED IRON PO Take 18 mg by mouth daily.     ciclopirox (PENLAC) 8 % solution Apply topically at bedtime. Apply over nail and surrounding skin. Apply daily over previous coat. After seven (7) days, may remove with alcohol and continue cycle. 6.6 mL 0   COVID-19 mRNA bivalent vaccine, Pfizer, (PFIZER COVID-19 VAC BIVALENT) injection Inject into the muscle. 0.3 mL 0   Dulaglutide (TRULICITY) 1.5 MPF/2.9WKSOPN Inject into the skin. Once a week     EPINEPHrine (EPIPEN 2-PAK) 0.3 mg/0.3 mL IJ SOAJ injection Use as directed for life-threatening allergic reaction. 2 each 1   epoetin alfa-epbx (RETACRIT) 2000 UNIT/ML injection See admin instructions.     escitalopram (LEXAPRO) 20 MG tablet Take 20 mg by mouth daily.     ezetimibe (ZETIA) 10 MG tablet Take 1 tablet (10 mg total) by mouth daily.     fexofenadine (ALLEGRA ALLERGY) 180 MG tablet Take 1 tablet (180 mg total) by mouth daily.     folic acid (FOLVITE) 1 MG tablet Take 1 mg by mouth 2 (two) times daily.      hydrALAZINE (APRESOLINE) 50 MG tablet Take 50 mg by mouth 3 (three) times daily.     HYDROcodone-acetaminophen (NORCO/VICODIN) 5-325 MG tablet Take 1 tablet by mouth every 6 (six) hours as needed.     insulin aspart protamine - aspart (NOVOLOG 70/30  FLEXPEN RELION) (70-30) 100 UNIT/ML FlexPen Inject 0.15-0.2 mLs (15-20 Units total) into the skin in the morning, at noon, and at bedtime. 15 mL 11   insulin glargine, 2 Unit Dial, (TOUJEO MAX SOLOSTAR) 300 UNIT/ML Solostar Pen Inject 46 Units into the skin daily.     ipratropium (ATROVENT) 0.06 % nasal spray Place 1-2 sprays each nostril as needed up to 3 times a day 15 mL 5   irbesartan (AVAPRO) 300 MG tablet Take 1 tablet (300 mg total) by mouth daily. (Patient taking differently: Take 150 mg by mouth  daily.)     Lancets Misc. (ACCU-CHEK SOFTCLIX LANCET DEV) KIT See admin instructions.     latanoprost (XALATAN) 0.005 % ophthalmic solution 1 drop into affected eye in the evening     levothyroxine (SYNTHROID) 125 MCG tablet Take 125 mcg by mouth daily before breakfast. BRAND ONLY     LORazepam (ATIVAN) 0.5 MG tablet Take 0.5 mg by mouth as needed for anxiety.     metaxalone (SKELAXIN) 800 MG tablet Take 800 mg by mouth 3 (three) times daily as needed.     Methotrexate Sodium (METHOTREXATE, PF,) 50 MG/2ML injection Inject 0.8 mLs (20 mg total) into the muscle once a week. 1 ml once a week 2 mL    metoprolol succinate (TOPROL-XL) 25 MG 24 hr tablet Take 1 tablet (25 mg total) by mouth daily. May take additional half tablet as needed once daily for palpitations. 135 tablet 3   Multiple Vitamins-Minerals (CENTRUM SILVER PO) Take 1 tablet by mouth daily.     sulfaSALAzine (AZULFIDINE) 500 MG tablet Take 3 tablets (1,500 mg total) by mouth 2 (two) times daily.     tocilizumab (ACTEMRA) 400 MG/20ML SOLN injection See admin instructions.     vitamin B-12 (CYANOCOBALAMIN) 1000 MCG tablet Take by mouth. 3 times a week     VITAMIN D, ERGOCALCIFEROL, PO Take 2,000 mg by mouth 2 (two) times daily.     No current facility-administered medications for this visit.    PHYSICAL EXAMINATION: ECOG PERFORMANCE STATUS: {CHL ONC ECOG [phone]}  There were no vitals filed for this visit. There were no vitals filed for this visit.  BREAST:*** No palpable masses or nodules in either right or left breasts. No palpable axillary supraclavicular or infraclavicular adenopathy no breast tenderness or nipple discharge. (exam performed in the presence of a chaperone)  LABORATORY DATA:  I have reviewed the data as listed    Latest Ref Rng & Units 10/07/2018    9:08 AM 07/12/2018    7:52 AM 07/11/2018   12:01 PM  CMP  Glucose 65 - 99 mg/dL 123  128  143   BUN 8 - 27 mg/dL 26  47  43   Creatinine 0.57 - 1.00 mg/dL  1.13  1.35  1.60   Sodium 134 - 144 mmol/L 140  136  136   Potassium 3.5 - 5.2 mmol/L 5.1  4.6  4.3   Chloride 96 - 106 mmol/L 105  103  103   CO2 20 - 29 mmol/L '21  23  22   ' Calcium 8.7 - 10.3 mg/dL 9.4  8.7  8.8     Lab Results  Component Value Date   WBC 6.8 04/26/2022   HGB 9.7 (L) 04/26/2022   HCT 31.9 (L) 04/26/2022   MCV 89.9 04/26/2022   PLT 170 04/26/2022   NEUTROABS 4.1 04/26/2022    ASSESSMENT & PLAN:  No problem-specific Assessment & Plan notes found for this encounter.  No orders of the defined types were placed in this encounter.  The patient has a good understanding of the overall plan. she agrees with it. she will call with any problems that may develop before the next visit here. Total time spent: 30 mins including face to face time and time spent for planning, charting and co-ordination of care   Suzzette Righter, [place] 05/23/22    I Gardiner Coins am scribing for Dr. Lindi Adie  ***

## 2022-05-24 ENCOUNTER — Other Ambulatory Visit: Payer: Self-pay

## 2022-05-24 ENCOUNTER — Inpatient Hospital Stay: Payer: Medicare PPO

## 2022-05-24 ENCOUNTER — Inpatient Hospital Stay: Payer: Medicare PPO | Attending: Hematology and Oncology | Admitting: Hematology and Oncology

## 2022-05-24 ENCOUNTER — Telehealth (HOSPITAL_COMMUNITY): Payer: Self-pay | Admitting: *Deleted

## 2022-05-24 DIAGNOSIS — M65331 Trigger finger, right middle finger: Secondary | ICD-10-CM | POA: Diagnosis not present

## 2022-05-24 DIAGNOSIS — N183 Chronic kidney disease, stage 3 unspecified: Secondary | ICD-10-CM | POA: Diagnosis not present

## 2022-05-24 DIAGNOSIS — D631 Anemia in chronic kidney disease: Secondary | ICD-10-CM

## 2022-05-24 DIAGNOSIS — D649 Anemia, unspecified: Secondary | ICD-10-CM

## 2022-05-24 DIAGNOSIS — D638 Anemia in other chronic diseases classified elsewhere: Secondary | ICD-10-CM

## 2022-05-24 DIAGNOSIS — M65332 Trigger finger, left middle finger: Secondary | ICD-10-CM | POA: Diagnosis not present

## 2022-05-24 LAB — CBC WITH DIFFERENTIAL (CANCER CENTER ONLY)
Abs Immature Granulocytes: 0.02 10*3/uL (ref 0.00–0.07)
Basophils Absolute: 0.1 10*3/uL (ref 0.0–0.1)
Basophils Relative: 1 %
Eosinophils Absolute: 0.2 10*3/uL (ref 0.0–0.5)
Eosinophils Relative: 3 %
HCT: 34.6 % — ABNORMAL LOW (ref 36.0–46.0)
Hemoglobin: 10.4 g/dL — ABNORMAL LOW (ref 12.0–15.0)
Immature Granulocytes: 0 %
Lymphocytes Relative: 29 %
Lymphs Abs: 1.7 10*3/uL (ref 0.7–4.0)
MCH: 27.6 pg (ref 26.0–34.0)
MCHC: 30.1 g/dL (ref 30.0–36.0)
MCV: 91.8 fL (ref 80.0–100.0)
Monocytes Absolute: 0.4 10*3/uL (ref 0.1–1.0)
Monocytes Relative: 7 %
Neutro Abs: 3.5 10*3/uL (ref 1.7–7.7)
Neutrophils Relative %: 60 %
Platelet Count: 227 10*3/uL (ref 150–400)
RBC: 3.77 MIL/uL — ABNORMAL LOW (ref 3.87–5.11)
RDW: 15.9 % — ABNORMAL HIGH (ref 11.5–15.5)
WBC Count: 5.9 10*3/uL (ref 4.0–10.5)
nRBC: 0 % (ref 0.0–0.2)

## 2022-05-24 MED ORDER — EPOETIN ALFA 40000 UNIT/ML IJ SOLN
40000.0000 [IU] | Freq: Once | INTRAMUSCULAR | Status: AC
  Administered 2022-05-24: 40000 [IU] via SUBCUTANEOUS
  Filled 2022-05-24: qty 1

## 2022-05-24 NOTE — Assessment & Plan Note (Signed)
Patient has history of hypothyroidism as well as possible adrenal dysfunction issues for which she goes to [facility]  Previous blood work review: 1.Erythropoietin: 24.2 2. SPEP:No M protein 3.Haptoglobin: 146, LDH 281, absolute reticulocyte count 96.4, direct Coombs test negative 4.B12 750 5.Folic acid 03.7 6.Ferritin 88, iron saturation 25 7.Hemoglobin 9.6 with MCV 89 ----------------------------------------------------------------------------------------------------------------------------------------------- Etiology: Anemia of chronic disease secondary tochronic kidney disease,hypothyroidism and adrenal issues  Current treatment:Aranesp300 mcg started7/06/2020.  (Retacrit injections for hemoglobin 10.5 or less) Lab review 03/23/2020: Erythropoietin 27.9, hemoglobin 9.6 02/02/2021: Hemoglobin 10.6 (given her severe symptomscontinue with Aranesp) 06/22/2021: Hemoglobin 11.3 11/07/2021: Hemoglobin 11.1 11/21/2021: Hemoglobin 10.5 (noRetacrittoday) 01/02/2022: Hemoglobin 10.7 (no Retacrit today): Last Retacrit was on 12/05/2021  02/01/2022: Hemoglobin 10.3: Proceeding to Retacrit today 05/24/2022:  Return to clinic monthly for lab checks and Retacrit injection if needed.  Follow-up with me in 3 months

## 2022-05-24 NOTE — Telephone Encounter (Signed)
Patient given detailed instructions per Myocardial Perfusion Study Information Sheet for the test on 05/30/2022 at 1:15. Patient notified to arrive 15 minutes early and that it is imperative to arrive on time for appointment to keep from having the test rescheduled.  If you need to cancel or reschedule your appointment, please call the office within 24 hours of your appointment. . Patient verbalized understanding.Jocelyn Kaufman

## 2022-05-24 NOTE — Patient Instructions (Signed)
Epoetin Alfa injection ?What is this medication? ?EPOETIN ALFA (e POE e tin AL fa) helps your body make more red blood cells. This medicine is used to treat anemia caused by chronic kidney disease, cancer chemotherapy, or HIV-therapy. It may also be used before surgery if you have anemia. ?This medicine may be used for other purposes; ask your health care provider or pharmacist if you have questions. ?COMMON BRAND NAME(S): Epogen, Procrit, Retacrit ?What should I tell my care team before I take this medication? ?They need to know if you have any of these conditions: ?cancer ?heart disease ?high blood pressure ?history of blood clots ?history of stroke ?low levels of folate, iron, or vitamin B12 in the blood ?seizures ?an unusual or allergic reaction to erythropoietin, albumin, benzyl alcohol, hamster proteins, other medicines, foods, dyes, or preservatives ?pregnant or trying to get pregnant ?breast-feeding ?How should I use this medication? ?This medicine is for injection into a vein or under the skin. It is usually given by a health care professional in a hospital or clinic setting. ?If you get this medicine at home, you will be taught how to prepare and give this medicine. Use exactly as directed. Take your medicine at regular intervals. Do not take your medicine more often than directed. ?It is important that you put your used needles and syringes in a special sharps container. Do not put them in a trash can. If you do not have a sharps container, call your pharmacist or healthcare provider to get one. ?A special MedGuide will be given to you by the pharmacist with each prescription and refill. Be sure to read this information carefully each time. ?Talk to your pediatrician regarding the use of this medicine in children. While this drug may be prescribed for selected conditions, precautions do apply. ?Overdosage: If you think you have taken too much of this medicine contact a poison control center or emergency  room at once. ?NOTE: This medicine is only for you. Do not share this medicine with others. ?What if I miss a dose? ?If you miss a dose, take it as soon as you can. If it is almost time for your next dose, take only that dose. Do not take double or extra doses. ?What may interact with this medication? ?Interactions have not been studied. ?This list may not describe all possible interactions. Give your health care provider a list of all the medicines, herbs, non-prescription drugs, or dietary supplements you use. Also tell them if you smoke, drink alcohol, or use illegal drugs. Some items may interact with your medicine. ?What should I watch for while using this medication? ?Your condition will be monitored carefully while you are receiving this medicine. ?You may need blood work done while you are taking this medicine. ?This medicine may cause a decrease in vitamin B6. You should make sure that you get enough vitamin B6 while you are taking this medicine. Discuss the foods you eat and the vitamins you take with your health care professional. ?What side effects may I notice from receiving this medication? ?Side effects that you should report to your doctor or health care professional as soon as possible: ?allergic reactions like skin rash, itching or hives, swelling of the face, lips, or tongue ?seizures ?signs and symptoms of a blood clot such as breathing problems; changes in vision; chest pain; severe, sudden headache; pain, swelling, warmth in the leg; trouble speaking; sudden numbness or weakness of the face, arm or leg ?signs and symptoms of a stroke like   changes in vision; confusion; trouble speaking or understanding; severe headaches; sudden numbness or weakness of the face, arm or leg; trouble walking; dizziness; loss of balance or coordination ?Side effects that usually do not require medical attention (report to your doctor or health care professional if they continue or are  bothersome): ?chills ?cough ?dizziness ?fever ?headaches ?joint pain ?muscle cramps ?muscle pain ?nausea, vomiting ?pain, redness, or irritation at site where injected ?This list may not describe all possible side effects. Call your doctor for medical advice about side effects. You may report side effects to FDA at 1-800-FDA-1088. ?Where should I keep my medication? ?Keep out of the reach of children. ?Store in a refrigerator between 2 and 8 degrees C (36 and 46 degrees F). Do not freeze or shake. Throw away any unused portion if using a single-dose vial. Multi-dose vials can be kept in the refrigerator for up to 21 days after the initial dose. Throw away unused medicine. ?NOTE: This sheet is a summary. It may not cover all possible information. If you have questions about this medicine, talk to your doctor, pharmacist, or health care provider. ?? 2023 Elsevier/Gold Standard (2017-06-18 00:00:00) ? ?

## 2022-05-26 ENCOUNTER — Emergency Department (HOSPITAL_COMMUNITY): Payer: Medicare PPO

## 2022-05-26 ENCOUNTER — Encounter (HOSPITAL_COMMUNITY): Payer: Self-pay

## 2022-05-26 ENCOUNTER — Other Ambulatory Visit: Payer: Self-pay

## 2022-05-26 ENCOUNTER — Emergency Department (HOSPITAL_COMMUNITY)
Admission: EM | Admit: 2022-05-26 | Discharge: 2022-05-26 | Disposition: A | Discharge status: Home / Self Care | Payer: Medicare PPO | Attending: Emergency Medicine | Admitting: Emergency Medicine

## 2022-05-26 DIAGNOSIS — Z79899 Other long term (current) drug therapy: Secondary | ICD-10-CM | POA: Diagnosis not present

## 2022-05-26 DIAGNOSIS — Z743 Need for continuous supervision: Secondary | ICD-10-CM | POA: Diagnosis not present

## 2022-05-26 DIAGNOSIS — S82001A Unspecified fracture of right patella, initial encounter for closed fracture: Secondary | ICD-10-CM | POA: Diagnosis not present

## 2022-05-26 DIAGNOSIS — S76121A Laceration of right quadriceps muscle, fascia and tendon, initial encounter: Secondary | ICD-10-CM | POA: Insufficient documentation

## 2022-05-26 DIAGNOSIS — M79604 Pain in right leg: Secondary | ICD-10-CM | POA: Diagnosis not present

## 2022-05-26 DIAGNOSIS — Z7982 Long term (current) use of aspirin: Secondary | ICD-10-CM | POA: Insufficient documentation

## 2022-05-26 DIAGNOSIS — Z91040 Latex allergy status: Secondary | ICD-10-CM | POA: Insufficient documentation

## 2022-05-26 DIAGNOSIS — S8991XA Unspecified injury of right lower leg, initial encounter: Secondary | ICD-10-CM | POA: Diagnosis present

## 2022-05-26 DIAGNOSIS — S76111A Strain of right quadriceps muscle, fascia and tendon, initial encounter: Secondary | ICD-10-CM | POA: Diagnosis not present

## 2022-05-26 DIAGNOSIS — M545 Low back pain, unspecified: Secondary | ICD-10-CM | POA: Insufficient documentation

## 2022-05-26 DIAGNOSIS — Y92007 Garden or yard of unspecified non-institutional (private) residence as the place of occurrence of the external cause: Secondary | ICD-10-CM | POA: Insufficient documentation

## 2022-05-26 DIAGNOSIS — Z794 Long term (current) use of insulin: Secondary | ICD-10-CM | POA: Diagnosis not present

## 2022-05-26 DIAGNOSIS — M6530 Trigger finger, unspecified finger: Secondary | ICD-10-CM | POA: Insufficient documentation

## 2022-05-26 DIAGNOSIS — M25461 Effusion, right knee: Secondary | ICD-10-CM | POA: Diagnosis not present

## 2022-05-26 DIAGNOSIS — Y9301 Activity, walking, marching and hiking: Secondary | ICD-10-CM | POA: Diagnosis not present

## 2022-05-26 DIAGNOSIS — M7989 Other specified soft tissue disorders: Secondary | ICD-10-CM | POA: Diagnosis not present

## 2022-05-26 DIAGNOSIS — W108XXA Fall (on) (from) other stairs and steps, initial encounter: Secondary | ICD-10-CM | POA: Diagnosis not present

## 2022-05-26 DIAGNOSIS — M25561 Pain in right knee: Secondary | ICD-10-CM | POA: Diagnosis not present

## 2022-05-26 DIAGNOSIS — S8990XA Unspecified injury of unspecified lower leg, initial encounter: Secondary | ICD-10-CM | POA: Diagnosis not present

## 2022-05-26 LAB — BASIC METABOLIC PANEL
Anion gap: 7 (ref 5–15)
BUN: 18 mg/dL (ref 8–23)
CO2: 23 mmol/L (ref 22–32)
Calcium: 9.7 mg/dL (ref 8.9–10.3)
Chloride: 113 mmol/L — ABNORMAL HIGH (ref 98–111)
Creatinine, Ser: 1.03 mg/dL — ABNORMAL HIGH (ref 0.44–1.00)
GFR, Estimated: 60 mL/min — ABNORMAL LOW (ref >60)
Glucose, Bld: 110 mg/dL — ABNORMAL HIGH (ref 70–99)
Potassium: 4.8 mmol/L (ref 3.5–5.1)
Sodium: 143 mmol/L (ref 135–145)

## 2022-05-26 LAB — CBC
HCT: 39.6 % (ref 36.0–46.0)
Hemoglobin: 11.7 g/dL — ABNORMAL LOW (ref 12.0–15.0)
MCH: 27.1 pg (ref 26.0–34.0)
MCHC: 29.5 g/dL — ABNORMAL LOW (ref 30.0–36.0)
MCV: 91.7 fL (ref 80.0–100.0)
Platelets: 221 10*3/uL (ref 150–400)
RBC: 4.32 MIL/uL (ref 3.87–5.11)
RDW: 16.1 % — ABNORMAL HIGH (ref 11.5–15.5)
WBC: 7.3 10*3/uL (ref 4.0–10.5)
nRBC: 0 % (ref 0.0–0.2)

## 2022-05-26 LAB — CBG MONITORING, ED: Glucose-Capillary: 112 mg/dL — ABNORMAL HIGH (ref 70–99)

## 2022-05-26 MED ORDER — HYDROCODONE-ACETAMINOPHEN 5-325 MG PO TABS: 1.0000 | ORAL_TABLET | Freq: Four times a day (QID) | ORAL | 0 refills | Status: DC | PRN

## 2022-05-26 MED ORDER — ONDANSETRON 4 MG PO TBDP
4.0000 mg | ORAL_TABLET | Freq: Once | ORAL | Status: AC
  Administered 2022-05-26: 4 mg via ORAL
  Filled 2022-05-26: qty 1

## 2022-05-26 MED ORDER — HYDROCODONE-ACETAMINOPHEN 5-325 MG PO TABS
1.0000 | ORAL_TABLET | Freq: Once | ORAL | Status: AC
  Administered 2022-05-26: 1 via ORAL
  Filled 2022-05-26: qty 1

## 2022-05-26 MED ORDER — IBUPROFEN 200 MG PO TABS
600.0000 mg | ORAL_TABLET | Freq: Once | ORAL | Status: AC
  Administered 2022-05-26: 600 mg via ORAL
  Filled 2022-05-26: qty 3

## 2022-05-26 MED ORDER — OXYCODONE HCL 5 MG PO TABS
5.0000 mg | ORAL_TABLET | Freq: Once | ORAL | Status: AC
  Administered 2022-05-26: 5 mg via ORAL
  Filled 2022-05-26: qty 1

## 2022-05-26 MED ORDER — HYDROMORPHONE HCL 1 MG/ML IJ SOLN
1.0000 mg | Freq: Once | INTRAMUSCULAR | Status: AC
  Administered 2022-05-26: 1 mg via INTRAVENOUS
  Filled 2022-05-26: qty 1

## 2022-05-26 NOTE — ED Triage Notes (Signed)
Pt BIB [facility] from home. Pt reports falling down 2 steps walking out her back down. Pt reports she landed on right knee and now endorses right knee pain and swelling. Pt also endorses leg numbness and and right lower back pain.

## 2022-05-26 NOTE — Discharge Instructions (Addendum)
Be at Dr. Rich Fuchs office on Tuesday, Aug 1 at 8 AM  You may bear weight on your right leg as long as you are wearing the knee immobilizer.  Use a walker for support.  Use ibuprofen and tylenol for pain. If pain becomes severe or these don't help, then use the hydrocodone

## 2022-05-26 NOTE — Care Management (Signed)
Consult due to a fall Patient cannot bear weight or use crutches. DME company has left for the day. Could ship to home, however that will not be until potentially Monday. Discussed with provider and team. Patient can purchase own, or try to find a loaner there. Discussed home health.

## 2022-05-26 NOTE — ED Provider Notes (Signed)
[facility] DEPT Provider Note   CSN: [id number] Arrival date & time: 05/26/22  7290     History  Chief Complaint  Patient presents with   Lytle Michaels    Jocelyn Kaufman is a 66 y.o. female present emerged department with a fall and complaint of leg pain and back pain.  The patient reports that she lost her footing going down 2 steps in her backyard today, and fell landing with her right lower leg hyper flexed underneath her body, the left leg extended.  She was unable to stand afterwards due to pain in her right leg.  She is also having pain in her lower back.  Denies head injury or loss of consciousness.  Follows with Dr Ophelia Charter orthopedics  HPI     Home Medications Prior to Admission medications   Medication Sig Start Date End Date Taking? Authorizing Provider  Accu-Chek Softclix Lancets lancets use to check blood sugar 04/26/21   [provider]  acetaminophen (TYLENOL) 500 MG tablet Take 1,000 mg by mouth every 6 (six) hours as needed.    [provider]  Aflibercept (EYLEA) 2 MG/0.05ML SOLN 2 mg by Intravitreal route every 3 (three) months. 04/25/20   Nicholas Lose, MD  albuterol (VENTOLIN HFA) 108 (90 Base) MCG/ACT inhaler Inhale two puffs every four to six hours as needed for cough or wheeze. 10/05/21   Kennith Gain, MD  amLODipine (NORVASC) 10 MG tablet Take 1 tablet (10 mg total) by mouth daily. 02/01/22   Nicholas Lose, MD  aspirin EC 81 MG tablet Take 1 tablet (81 mg total) by mouth daily. Swallow whole. 08/12/20   Nicholas Lose, MD  atorvastatin (LIPITOR) 40 MG tablet Take 1 tablet (40 mg total) by mouth daily at 6 PM. 07/12/18   Bonnell Public, MD  B-D ULTRAFINE III SHORT PEN 31G X 8 MM MISC SMARTSIG:1 Each SUB-Q Daily 02/12/20   [provider]  Beclomethasone Dipropionate (QVAR IN) Inhale 2 puffs into the lungs 3 (three) times daily as needed.    [provider]  CHELATED IRON PO Take 18 mg by  mouth daily.    [provider]  ciclopirox (PENLAC) 8 % solution Apply topically at bedtime. Apply over nail and surrounding skin. Apply daily over previous coat. After seven (7) days, may remove with alcohol and continue cycle. 11/09/21   McDonald, Stephan Minister, DPM  COVID-19 mRNA bivalent vaccine, Pfizer, (PFIZER COVID-19 VAC BIVALENT) injection Inject into the muscle. 12/01/21   Carlyle Basques, MD  Dulaglutide (TRULICITY) 1.5 SX/1.1BZ SOPN Inject into the skin. Once a week    [provider]  EPINEPHrine (EPIPEN 2-PAK) 0.3 mg/0.3 mL IJ SOAJ injection Use as directed for life-threatening allergic reaction. 04/05/22   Kennith Gain, MD  epoetin alfa-epbx (RETACRIT) 2000 UNIT/ML injection See admin instructions.    [provider]  escitalopram (LEXAPRO) 20 MG tablet Take 20 mg by mouth daily.    [provider]  ezetimibe (ZETIA) 10 MG tablet Take 1 tablet (10 mg total) by mouth daily. 04/25/20   Nicholas Lose, MD  fexofenadine (ALLEGRA ALLERGY) 180 MG tablet Take 1 tablet (180 mg total) by mouth daily. 11/21/21   Nicholas Lose, MD  folic acid (FOLVITE) 1 MG tablet Take 1 mg by mouth 2 (two) times daily.     [provider]  hydrALAZINE (APRESOLINE) 50 MG tablet Take 50 mg by mouth 3 (three) times daily.    [provider]  HYDROcodone-acetaminophen (  NORCO/VICODIN) 5-325 MG tablet Take 1 tablet by mouth every 6 (six) hours as needed. 06/20/21   [provider]  insulin aspart protamine - aspart (NOVOLOG 70/30 FLEXPEN RELION) (70-30) 100 UNIT/ML FlexPen Inject 0.15-0.2 mLs (15-20 Units total) into the skin in the morning, at noon, and at bedtime. 08/12/20   Nicholas Lose, MD  insulin glargine, 2 Unit Dial, (TOUJEO MAX SOLOSTAR) 300 UNIT/ML Solostar Pen Inject 46 Units into the skin daily. 02/02/21   Nicholas Lose, MD  ipratropium (ATROVENT) 0.06 % nasal spray Place 1-2 sprays each nostril as needed up to 3 times a day 04/05/22   Kennith Gain, MD  irbesartan (AVAPRO) 300 MG tablet Take 1 tablet (300 mg total) by mouth daily. Patient taking differently: Take 150 mg by mouth daily. 02/01/22   Nicholas Lose, MD  Lancets Misc. (ACCU-CHEK SOFTCLIX LANCET DEV) KIT See admin instructions. 04/25/21   [provider]  latanoprost (XALATAN) 0.005 % ophthalmic solution 1 drop into affected eye in the evening    [provider]  levothyroxine (SYNTHROID) 125 MCG tablet Take 125 mcg by mouth daily before breakfast. BRAND ONLY    [provider]  LORazepam (ATIVAN) 0.5 MG tablet Take 0.5 mg by mouth as needed for anxiety.    [provider]  metaxalone (SKELAXIN) 800 MG tablet Take 800 mg by mouth 3 (three) times daily as needed.    [provider]  metoprolol succinate (TOPROL-XL) 25 MG 24 hr tablet Take 1 tablet (25 mg total) by mouth daily. May take additional half tablet as needed once daily for palpitations. 05/07/22   Loel Dubonnet, NP  Multiple Vitamins-Minerals (CENTRUM SILVER PO) Take 1 tablet by mouth daily.    [provider]  sulfaSALAzine (AZULFIDINE) 500 MG tablet Take 3 tablets (1,500 mg total) by mouth 2 (two) times daily. 11/21/21   Nicholas Lose, MD  tocilizumab (ACTEMRA) 400 MG/20ML SOLN injection See admin instructions. 02/08/22   [provider]  vitamin B-12 (CYANOCOBALAMIN) 1000 MCG tablet Take by mouth. 3 times a week    [provider]  VITAMIN D, ERGOCALCIFEROL, PO Take 2,000 mg by mouth 2 (two) times daily.    [provider]      Allergies    Ozempic (0.25 or 0.5 mg-dose) [semaglutide(0.25 or 0.42m-dos)], Jardiance [empagliflozin], Latex, Tramadol hcl, Invokana [canagliflozin], Percocet [oxycodone-acetaminophen], Prilosec [omeprazole], Rogaine [minoxidil], and Victoza [liraglutide]    Review of Systems   Review of Systems  Physical Exam Updated Vital Signs BP (!) 148/69 (BP Location: Left Arm)   Pulse 69   Temp 98.9 F  (37.2 C) (Oral)   Resp 16   SpO2 99%  Physical Exam Constitutional:      General: She is not in acute distress. HENT:     Head: Normocephalic and atraumatic.  Eyes:     Conjunctiva/sclera: Conjunctivae normal.     Pupils: Pupils are equal, round, and reactive to light.  Cardiovascular:     Rate and Rhythm: Normal rate and regular rhythm.  Pulmonary:     Effort: Pulmonary effort is normal. No respiratory distress.  Musculoskeletal:     Comments: Patient is able to passively flex both legs to approximately 90 degrees, cannot extend right lower extremity fully Effusion and tenderness around the right peripatellar Lumbar midline tenderness  Skin:    General: Skin is warm and dry.  Neurological:     General: No focal deficit present.     Mental Status: She is alert. Mental  status is at baseline.  Psychiatric:        Mood and Affect: Mood normal.        Behavior: Behavior normal.     ED Results / Procedures / Treatments   Labs (all labs ordered are listed, but only abnormal results are displayed) Labs Reviewed - No data to display  EKG None  Radiology DG Tibia/Fibula Right  Result Date: 05/26/2022 CLINICAL DATA:  Acute leg pain following fall.  Initial encounter. EXAM: RIGHT TIBIA AND FIBULA - 2 VIEW COMPARISON:  None Available. FINDINGS: A few small bony structures along the very distal aspect of the patellar tendon with irregularity of the UPPER patella noted and indistinctness of the patellar tendon. Overlying soft tissue swelling with equivocal small knee effusion is present. No other acute fracture, subluxation or dislocation identified. Degenerative changes in the ankle are noted. There may be mild ankle soft tissue swelling noted. IMPRESSION: 1. Small bony structures along the very distal aspect of the patellar tendon, irregularity of the UPPER patella, indistinctness of the patellar tendon and overlying soft tissue swelling with equivocal small knee effusion. This is  suspicious for distal patellar tendon injury/avulsion fractures but correlate clinically. 2. Question mild ankle soft tissue swelling. Electronically Signed   By: Margarette Canada M.D.   On: 05/26/2022 11:21   DG Knee Complete 4 Views Right  Result Date: 05/26/2022 CLINICAL DATA:  Acute RIGHT knee pain following fall. Initial encounter. EXAM: RIGHT KNEE - COMPLETE 4+ VIEW COMPARISON:  None Available. FINDINGS: There are a few small bony structures along the very distal aspect of the patellar tendon/UPPER patella with indistinctness of the tendon noted. Overlying soft tissue swelling with equivocal small knee effusion is present. No focal bony lesions are identified. No other fractures are identified. IMPRESSION: 1. Few small bony structures along the very distal aspect of the patellar tendon with indistinctness of the patellar tendon, which may represent small avulsion fracture fragments/distal patellar tendon injury. Correlate clinically. 2. Overlying soft tissue swelling with equivocal small knee effusion. Electronically Signed   By: Margarette Canada M.D.   On: 05/26/2022 11:18   DG Lumbar Spine Complete  Result Date: 05/26/2022 CLINICAL DATA:  66 year old female with acute low back pain following fall. Initial encounter. EXAM: LUMBAR SPINE - COMPLETE 5 VIEW COMPARISON:  None Available. FINDINGS: There is no evidence of acute fracture or subluxation. 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 No focal bony lesions or spondylolysis noted. IMPRESSION: 1. No evidence of acute abnormality. 2. Multilevel degenerative changes, moderate to severe from L3-S1. Electronically Signed   By: Margarette Canada M.D.   On: 05/26/2022 11:11    Procedures Procedures    Medications Ordered in ED Medications - No data to display  ED Course/ Medical Decision Making/ A&P Clinical Course as of 05/26/22 1633  Sat May 26, 2022  1524 Attempting to ambulate with knee  immobilizer and crutches, NWB on right leg, if able can be discharged home, if not would require medical admission & likely PT eval/rehab [MT]  [place] Secretary paging [facility] as patient follows with them. [MT]  1540 Signed out to Dr Regenia Skeeter pending f/u with [facility] orthopedist [MT]    Clinical Course User Index [MT] Langston Masker Carola Rhine, MD                           Medical Decision Making Amount and/or Complexity of Data Reviewed Labs: ordered.  Radiology: ordered.  Risk OTC drugs. Prescription drug management.   Patient is here with a fall and an injury, to her right knee and peripatellar region, also lower back.  No evidence significant head trauma or indication for emergent imaging of the brain.  X-rays of the lumbar spine and the right knee and tib-fib ordered and personally viewed interpreted, concerning for patellar tendon injury.  She has significant tenderness around the right knee.  I have ordered a CT scan of the right knee and the lumbar spine as well to evaluate for tibial plateau fracture or peripatellar fracture.  She is able to flex her right lower leg but is having difficulty extending it.  Medications ordered for pain.  CT imaging concerning for quadriceps tendon rupture         Final Clinical Impression(s) / ED Diagnoses Final diagnoses:  None    Rx / DC Orders ED Discharge Orders     None         Wyvonnia Dusky, MD 05/26/22 [phone]

## 2022-05-26 NOTE — TOC Progression Note (Signed)
Transition of Care [facility]) - Progression Note    Patient Details  Name: Jocelyn Kaufman MRN: [id number] Date of Birth: Mar 28, 1956  [facility] CM/SW Contact  Ross Ludwig, [place] Phone Number: 05/26/2022, 6:34 PM  Clinical Narrative:     CSW was informed that patient needed a rolling walker.  CSW contacted [employer], they were closed for the day.  CSW then tried [employer], they are unable to deliver a walker tonight.  Patient is non weight bearing, CSW was able to provide a new walker for patient through [employer] storage closet.  CSW delivered rolling walker to patient and had her sign delivery ticket.  CSW updated attending physician, bedside nurse, and weekend on call lead Nancy Marus.  CSW signing off, please reconsult if other social work needs arise.        Expected Discharge Plan and Services    Plan to discharge back home.                                             Social Determinants of Health (SDOH) Interventions    Readmission Risk Interventions     No data to display

## 2022-05-26 NOTE — ED Provider Triage Note (Signed)
Emergency Medicine Provider Triage Evaluation Note  Jocelyn Kaufman , a 66 y.o. female  was evaluated in triage.  Pt complains of lower back pain, right knee pain, right lower leg pain after a fall occurring about an hour prior to arrival.  Patient states that she was going out of her back porch carrying recycling and missed stepped.  No associated chest pain or shortness of breath.  She does not think that she passed out.  She did not hit her head or neck.  Transported by EMS due to inability to walk.  Pain in the low back is right-sided, described as muscle spasm.  Review of Systems  Positive: Back pain, knee pain Negative: Headache  Physical Exam  BP (!) 160/79 (BP Location: Left Arm)   Pulse 74   Temp 99.3 F (37.4 C) (Oral)   Resp 18   SpO2 97%  Gen:   Awake, no distress   Resp:  Normal effort  MSK:   Abrasions overlying the right knee area with mild swelling, patient tender to palpation over the right paraspinous musculature and lumbar spine. Other:    Medical Decision Making  Medically screening exam initiated at 9:40 AM.  Appropriate orders placed.  Jocelyn Kaufman was informed that the remainder of the evaluation will be completed by another provider, this initial triage assessment does not replace that evaluation, and the importance of remaining in the ED until their evaluation is complete.     Carlisle Cater, PA-C 05/26/22 [phone]

## 2022-05-26 NOTE — ED Provider Notes (Signed)
4:02 PM I discussed with Dr. Griffin Basil.  Can bear weight while in the knee immobilizer.  Needs to see him on Tuesday at 8 AM.  Likely surgery on Wednesday.  Patient did not do well walking with crutches with limited weightbearing.  We will try again after pain meds and with a walker and see if this is better.  Patient did much better with the walker.  Able to get a walker for her to take home and then 1 will be delivered tomorrow from social work.  Otherwise we will send in pain medicine to her pharmacy.  [facility] will take her home.   Sherwood Gambler, MD 05/26/22 [phone]

## 2022-05-26 NOTE — ED Notes (Signed)
Patient is unable to ambulate without putting weight on right leg using crutches at this time.

## 2022-05-28 ENCOUNTER — Other Ambulatory Visit (HOSPITAL_COMMUNITY): Payer: Self-pay

## 2022-05-28 ENCOUNTER — Telehealth: Payer: Self-pay | Admitting: Internal Medicine

## 2022-05-28 NOTE — Telephone Encounter (Signed)
Left message for Jocelyn Kaufman to send over formal clearance request so that this can be addressed

## 2022-05-28 NOTE — Telephone Encounter (Signed)
Sherry from [employer] office called stating that pt is scheduled for a stress test 05/30/22, however pt has fell and ruptured her tendon and they need to perform surgery on 05/30/22. She wants to know does she need to hold off on surgery till pt has stress test. Please advise.

## 2022-05-29 ENCOUNTER — Encounter: Payer: Self-pay | Admitting: *Deleted

## 2022-05-29 NOTE — Progress Notes (Signed)
RN successfully faxed hematology medical clearance to [facility] [phone]).

## 2022-05-29 NOTE — Telephone Encounter (Signed)
SEE PREVIOUS NOTES:     Pre-operative Risk Assessment    Patient Name: Jocelyn Kaufman  DOB: 06-06-1956 MRN: [id number]   URGENT SURGERY; PT FELL AD RUPTURED TENDON   Request for Surgical Clearance    Procedure:   URGENT RIGHT QUADRICEPS TENDON REPAIR  Date of Surgery:  Clearance 05/30/22                                 Surgeon:  DR. Ophelia Charter Surgeon's Group or Practice Name:  [facility] Phone number:  [phone] Fax number:  [phone] ATTN: Jocelyn Kaufman   Type of Clearance Requested:   - Medical ; NO MEDICATIONS ARE LISTED AS NEEDING TO BE HELD   Type of Anesthesia:   CHOICE   Additional requests/questions:    Jocelyn Kaufman   05/29/2022, 1:05 PM

## 2022-05-29 NOTE — Telephone Encounter (Signed)
   Patient Name: Jocelyn Kaufman  DOB: 1956/02/18 MRN: [id number]  Primary Cardiologist: Pixie Casino, MD  Chart reviewed as part of pre-operative protocol coverage. Given past medical history and time since last visit, based on ACC/AHA guidelines, Jocelyn Kaufman would be at acceptable risk for the planned procedure without further cardiovascular testing.    I will route this recommendation to the requesting party via Epic fax function and remove from pre-op pool.  Please call with questions.  Mable Fill, Marissa Nestle, NP 05/29/2022, 1:15 PM

## 2022-05-30 ENCOUNTER — Ambulatory Visit (HOSPITAL_COMMUNITY): Payer: Medicare PPO

## 2022-05-30 DIAGNOSIS — S83421A Sprain of lateral collateral ligament of right knee, initial encounter: Secondary | ICD-10-CM | POA: Diagnosis not present

## 2022-05-30 DIAGNOSIS — S76111A Strain of right quadriceps muscle, fascia and tendon, initial encounter: Secondary | ICD-10-CM | POA: Diagnosis not present

## 2022-05-30 DIAGNOSIS — S76101A Unspecified injury of right quadriceps muscle, fascia and tendon, initial encounter: Secondary | ICD-10-CM | POA: Diagnosis not present

## 2022-05-30 DIAGNOSIS — G8918 Other acute postprocedural pain: Secondary | ICD-10-CM | POA: Diagnosis not present

## 2022-05-30 DIAGNOSIS — S83411A Sprain of medial collateral ligament of right knee, initial encounter: Secondary | ICD-10-CM | POA: Diagnosis not present

## 2022-05-30 DIAGNOSIS — X58XXXA Exposure to other specified factors, initial encounter: Secondary | ICD-10-CM | POA: Diagnosis not present

## 2022-05-30 DIAGNOSIS — Y999 Unspecified external cause status: Secondary | ICD-10-CM | POA: Diagnosis not present

## 2022-05-31 ENCOUNTER — Ambulatory Visit (HOSPITAL_COMMUNITY): Payer: Medicare PPO

## 2022-06-01 DIAGNOSIS — S76111D Strain of right quadriceps muscle, fascia and tendon, subsequent encounter: Secondary | ICD-10-CM | POA: Diagnosis not present

## 2022-06-01 DIAGNOSIS — M6281 Muscle weakness (generalized): Secondary | ICD-10-CM | POA: Diagnosis not present

## 2022-06-01 DIAGNOSIS — R269 Unspecified abnormalities of gait and mobility: Secondary | ICD-10-CM | POA: Diagnosis not present

## 2022-06-06 DIAGNOSIS — S76111D Strain of right quadriceps muscle, fascia and tendon, subsequent encounter: Secondary | ICD-10-CM | POA: Diagnosis not present

## 2022-06-06 DIAGNOSIS — R269 Unspecified abnormalities of gait and mobility: Secondary | ICD-10-CM | POA: Diagnosis not present

## 2022-06-06 DIAGNOSIS — M6281 Muscle weakness (generalized): Secondary | ICD-10-CM | POA: Diagnosis not present

## 2022-06-08 ENCOUNTER — Telehealth: Payer: Self-pay

## 2022-06-08 DIAGNOSIS — S76111D Strain of right quadriceps muscle, fascia and tendon, subsequent encounter: Secondary | ICD-10-CM | POA: Diagnosis not present

## 2022-06-08 NOTE — Telephone Encounter (Signed)
Call to pt to schedule intake for 06/19/22 PREP class Pt fell and sustained a quad tear and is 1 wk post op Advised will call her for an upcoming class so she has time to heal

## 2022-06-11 DIAGNOSIS — R269 Unspecified abnormalities of gait and mobility: Secondary | ICD-10-CM | POA: Diagnosis not present

## 2022-06-11 DIAGNOSIS — S76111D Strain of right quadriceps muscle, fascia and tendon, subsequent encounter: Secondary | ICD-10-CM | POA: Diagnosis not present

## 2022-06-11 DIAGNOSIS — M6281 Muscle weakness (generalized): Secondary | ICD-10-CM | POA: Diagnosis not present

## 2022-06-13 DIAGNOSIS — M6281 Muscle weakness (generalized): Secondary | ICD-10-CM | POA: Diagnosis not present

## 2022-06-13 DIAGNOSIS — S76111D Strain of right quadriceps muscle, fascia and tendon, subsequent encounter: Secondary | ICD-10-CM | POA: Diagnosis not present

## 2022-06-13 DIAGNOSIS — R269 Unspecified abnormalities of gait and mobility: Secondary | ICD-10-CM | POA: Diagnosis not present

## 2022-06-14 DIAGNOSIS — M0579 Rheumatoid arthritis with rheumatoid factor of multiple sites without organ or systems involvement: Secondary | ICD-10-CM | POA: Diagnosis not present

## 2022-06-18 DIAGNOSIS — M6281 Muscle weakness (generalized): Secondary | ICD-10-CM | POA: Diagnosis not present

## 2022-06-18 DIAGNOSIS — S76111D Strain of right quadriceps muscle, fascia and tendon, subsequent encounter: Secondary | ICD-10-CM | POA: Diagnosis not present

## 2022-06-18 DIAGNOSIS — R269 Unspecified abnormalities of gait and mobility: Secondary | ICD-10-CM | POA: Diagnosis not present

## 2022-06-21 ENCOUNTER — Inpatient Hospital Stay: Payer: Medicare PPO | Attending: Hematology and Oncology

## 2022-06-21 ENCOUNTER — Other Ambulatory Visit: Payer: Self-pay

## 2022-06-21 ENCOUNTER — Inpatient Hospital Stay: Payer: Medicare PPO

## 2022-06-21 DIAGNOSIS — N183 Chronic kidney disease, stage 3 unspecified: Secondary | ICD-10-CM | POA: Diagnosis not present

## 2022-06-21 DIAGNOSIS — D631 Anemia in chronic kidney disease: Secondary | ICD-10-CM | POA: Diagnosis not present

## 2022-06-21 LAB — CBC WITH DIFFERENTIAL (CANCER CENTER ONLY)
Abs Immature Granulocytes: 0.01 10*3/uL (ref 0.00–0.07)
Basophils Absolute: 0 10*3/uL (ref 0.0–0.1)
Basophils Relative: 1 %
Eosinophils Absolute: 0.1 10*3/uL (ref 0.0–0.5)
Eosinophils Relative: 2 %
HCT: 35.2 % — ABNORMAL LOW (ref 36.0–46.0)
Hemoglobin: 11 g/dL — ABNORMAL LOW (ref 12.0–15.0)
Immature Granulocytes: 0 %
Lymphocytes Relative: 33 %
Lymphs Abs: 1.7 10*3/uL (ref 0.7–4.0)
MCH: 28.4 pg (ref 26.0–34.0)
MCHC: 31.3 g/dL (ref 30.0–36.0)
MCV: 90.7 fL (ref 80.0–100.0)
Monocytes Absolute: 0.4 10*3/uL (ref 0.1–1.0)
Monocytes Relative: 7 %
Neutro Abs: 2.9 10*3/uL (ref 1.7–7.7)
Neutrophils Relative %: 57 %
Platelet Count: 198 10*3/uL (ref 150–400)
RBC: 3.88 MIL/uL (ref 3.87–5.11)
RDW: 16.3 % — ABNORMAL HIGH (ref 11.5–15.5)
WBC Count: 5.2 10*3/uL (ref 4.0–10.5)
nRBC: 0 % (ref 0.0–0.2)

## 2022-06-21 NOTE — Progress Notes (Signed)
Pt came in for retacrit injection today- hgb 11.0 and does not meet the parameters for injection today.  Printed pts labs and pt verbalized understanding.

## 2022-06-22 DIAGNOSIS — H26493 Other secondary cataract, bilateral: Secondary | ICD-10-CM | POA: Diagnosis not present

## 2022-06-22 DIAGNOSIS — H401131 Primary open-angle glaucoma, bilateral, mild stage: Secondary | ICD-10-CM | POA: Diagnosis not present

## 2022-06-25 DIAGNOSIS — R269 Unspecified abnormalities of gait and mobility: Secondary | ICD-10-CM | POA: Diagnosis not present

## 2022-06-25 DIAGNOSIS — S76111D Strain of right quadriceps muscle, fascia and tendon, subsequent encounter: Secondary | ICD-10-CM | POA: Diagnosis not present

## 2022-06-25 DIAGNOSIS — M6281 Muscle weakness (generalized): Secondary | ICD-10-CM | POA: Diagnosis not present

## 2022-07-04 DIAGNOSIS — R269 Unspecified abnormalities of gait and mobility: Secondary | ICD-10-CM | POA: Diagnosis not present

## 2022-07-04 DIAGNOSIS — S76111D Strain of right quadriceps muscle, fascia and tendon, subsequent encounter: Secondary | ICD-10-CM | POA: Diagnosis not present

## 2022-07-04 DIAGNOSIS — M6281 Muscle weakness (generalized): Secondary | ICD-10-CM | POA: Diagnosis not present

## 2022-07-11 DIAGNOSIS — M6281 Muscle weakness (generalized): Secondary | ICD-10-CM | POA: Diagnosis not present

## 2022-07-11 DIAGNOSIS — R269 Unspecified abnormalities of gait and mobility: Secondary | ICD-10-CM | POA: Diagnosis not present

## 2022-07-11 DIAGNOSIS — S76111D Strain of right quadriceps muscle, fascia and tendon, subsequent encounter: Secondary | ICD-10-CM | POA: Diagnosis not present

## 2022-07-13 DIAGNOSIS — M25561 Pain in right knee: Secondary | ICD-10-CM | POA: Diagnosis not present

## 2022-07-17 DIAGNOSIS — H26491 Other secondary cataract, right eye: Secondary | ICD-10-CM | POA: Diagnosis not present

## 2022-07-18 ENCOUNTER — Other Ambulatory Visit: Payer: Self-pay

## 2022-07-18 DIAGNOSIS — D631 Anemia in chronic kidney disease: Secondary | ICD-10-CM

## 2022-07-19 ENCOUNTER — Inpatient Hospital Stay: Payer: Medicare PPO | Attending: Hematology and Oncology

## 2022-07-19 ENCOUNTER — Other Ambulatory Visit: Payer: Self-pay

## 2022-07-19 ENCOUNTER — Inpatient Hospital Stay: Payer: Medicare PPO

## 2022-07-19 VITALS — BP 153/70 | HR 74 | Resp 18

## 2022-07-19 DIAGNOSIS — N183 Chronic kidney disease, stage 3 unspecified: Secondary | ICD-10-CM

## 2022-07-19 DIAGNOSIS — D631 Anemia in chronic kidney disease: Secondary | ICD-10-CM | POA: Insufficient documentation

## 2022-07-19 DIAGNOSIS — D638 Anemia in other chronic diseases classified elsewhere: Secondary | ICD-10-CM

## 2022-07-19 DIAGNOSIS — D649 Anemia, unspecified: Secondary | ICD-10-CM

## 2022-07-19 LAB — CBC WITH DIFFERENTIAL (CANCER CENTER ONLY)
Abs Immature Granulocytes: 0.01 10*3/uL (ref 0.00–0.07)
Basophils Absolute: 0 10*3/uL (ref 0.0–0.1)
Basophils Relative: 1 %
Eosinophils Absolute: 0.1 10*3/uL (ref 0.0–0.5)
Eosinophils Relative: 3 %
HCT: 32.5 % — ABNORMAL LOW (ref 36.0–46.0)
Hemoglobin: 10 g/dL — ABNORMAL LOW (ref 12.0–15.0)
Immature Granulocytes: 0 %
Lymphocytes Relative: 21 %
Lymphs Abs: 1.1 10*3/uL (ref 0.7–4.0)
MCH: 28.4 pg (ref 26.0–34.0)
MCHC: 30.8 g/dL (ref 30.0–36.0)
MCV: 92.3 fL (ref 80.0–100.0)
Monocytes Absolute: 0.4 10*3/uL (ref 0.1–1.0)
Monocytes Relative: 7 %
Neutro Abs: 3.7 10*3/uL (ref 1.7–7.7)
Neutrophils Relative %: 68 %
Platelet Count: 144 10*3/uL — ABNORMAL LOW (ref 150–400)
RBC: 3.52 MIL/uL — ABNORMAL LOW (ref 3.87–5.11)
RDW: 15.3 % (ref 11.5–15.5)
WBC Count: 5.4 10*3/uL (ref 4.0–10.5)
nRBC: 0 % (ref 0.0–0.2)

## 2022-07-19 MED ORDER — EPOETIN ALFA-EPBX 40000 UNIT/ML IJ SOLN
40000.0000 [IU] | Freq: Once | INTRAMUSCULAR | Status: AC
  Administered 2022-07-19: 40000 [IU] via SUBCUTANEOUS
  Filled 2022-07-19: qty 1

## 2022-07-22 IMAGING — CR DG SHOULDER 2+V*R*
2 series · 2 of 2 positions shown · non-contrast
Comparison: 07/09/2018

CLINICAL DATA: Fall, pain

EXAM:
RIGHT HUMERUS - 2+ VIEW; RIGHT ELBOW - COMPLETE 3+ VIEW; RIGHT
SHOULDER - 2+ VIEW

[w shoulder external right]
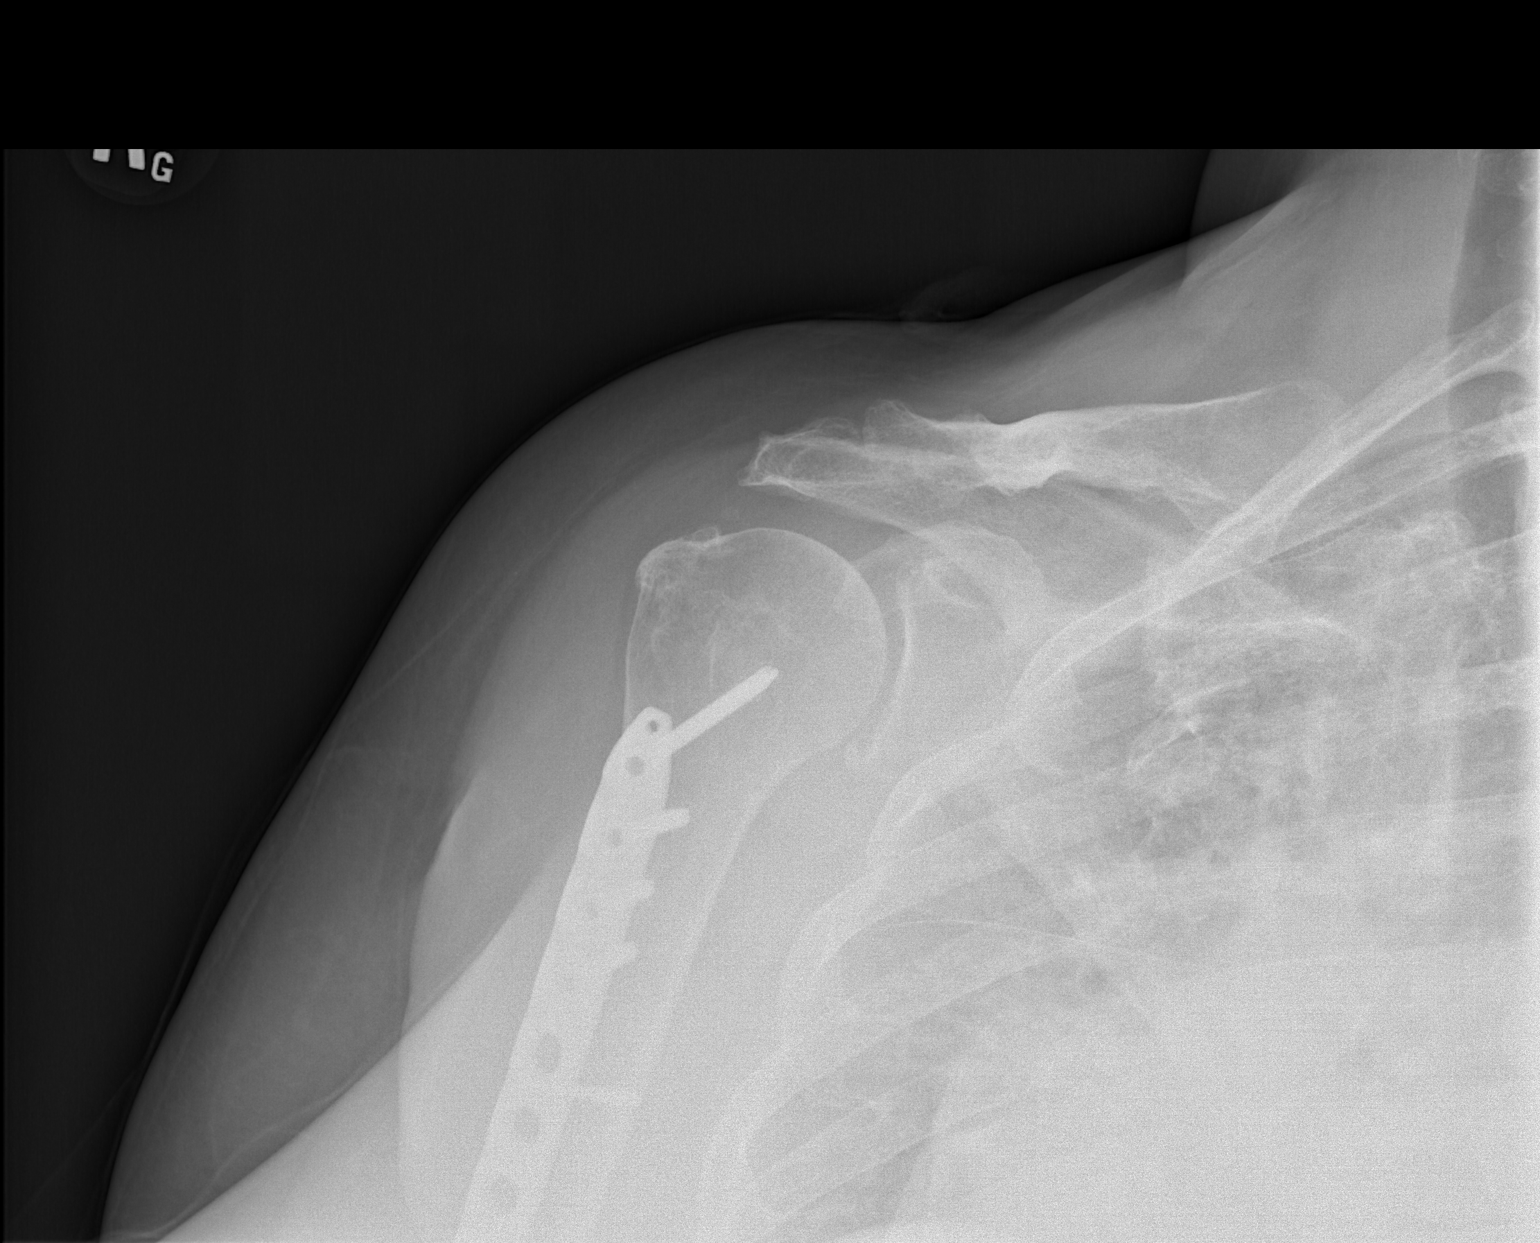

[w shoulder y-view right]
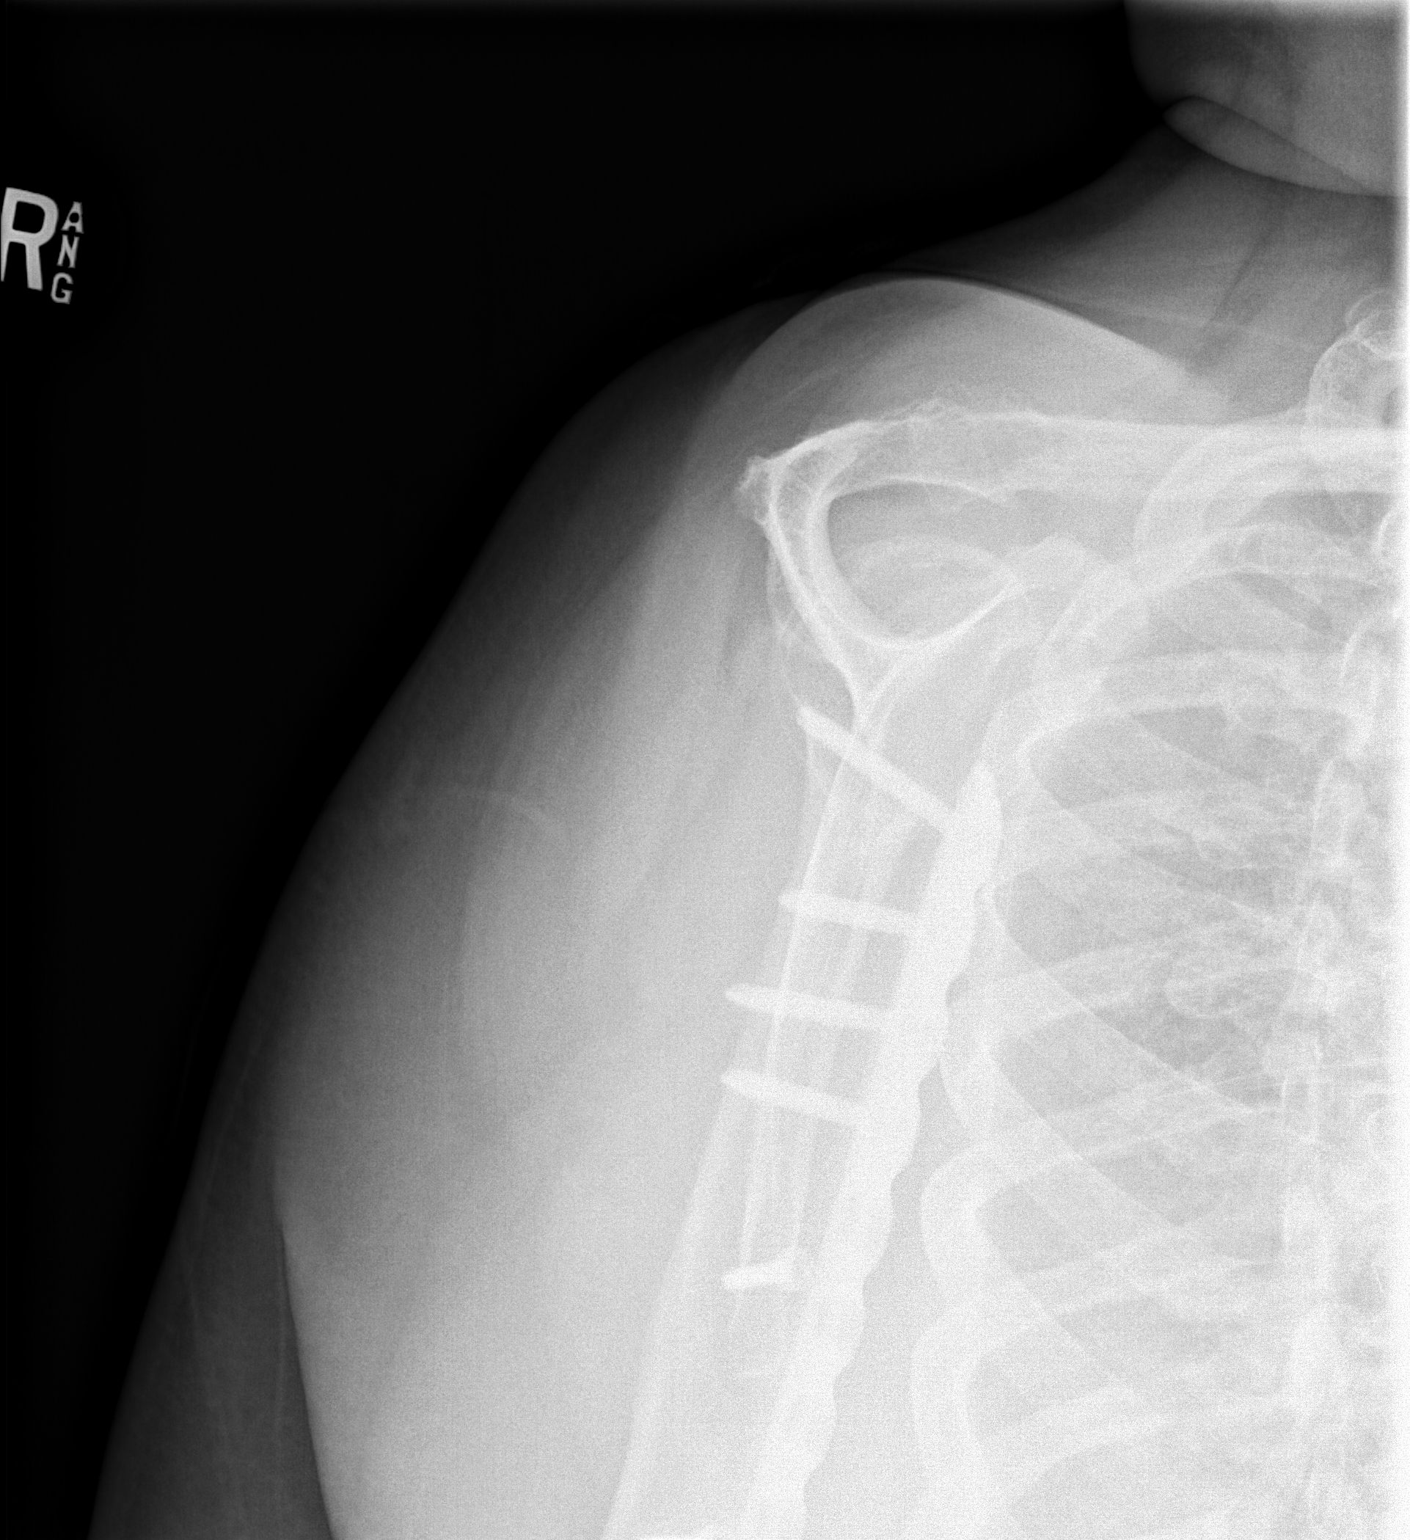

[2 of 2 positions shown; findings below may reference images not displayed]

FINDINGS: No acute fracture or dislocation of the right shoulder. Mild
acromioclavicular and glenohumeral arthrosis.

There is redemonstrated plate and screw fixation along the diaphysis
of the right humerus. No evidence of perihardware fracture or
loosening.

No fracture or dislocation of the right elbow. Mild elbow joint
arthrosis. No elbow joint effusion. Soft tissues are unremarkable.
IMPRESSION: 1. No acute fracture or dislocation of the right shoulder. Mild
acromioclavicular and glenohumeral arthrosis.
2. There is redemonstrated plate and screw fixation along the
diaphysis of the right humerus. No evidence of perihardware fracture
or loosening. No acute fracture.
3. No fracture or dislocation of the right elbow. Mild elbow joint
arthrosis.

## 2022-07-22 IMAGING — CR DG HUMERUS 2V *R*
2 series · 2 of 2 positions shown · non-contrast
Comparison: 07/09/2018

CLINICAL DATA: Fall, pain

EXAM:
RIGHT HUMERUS - 2+ VIEW; RIGHT ELBOW - COMPLETE 3+ VIEW; RIGHT
SHOULDER - 2+ VIEW

[w humerus lat right]
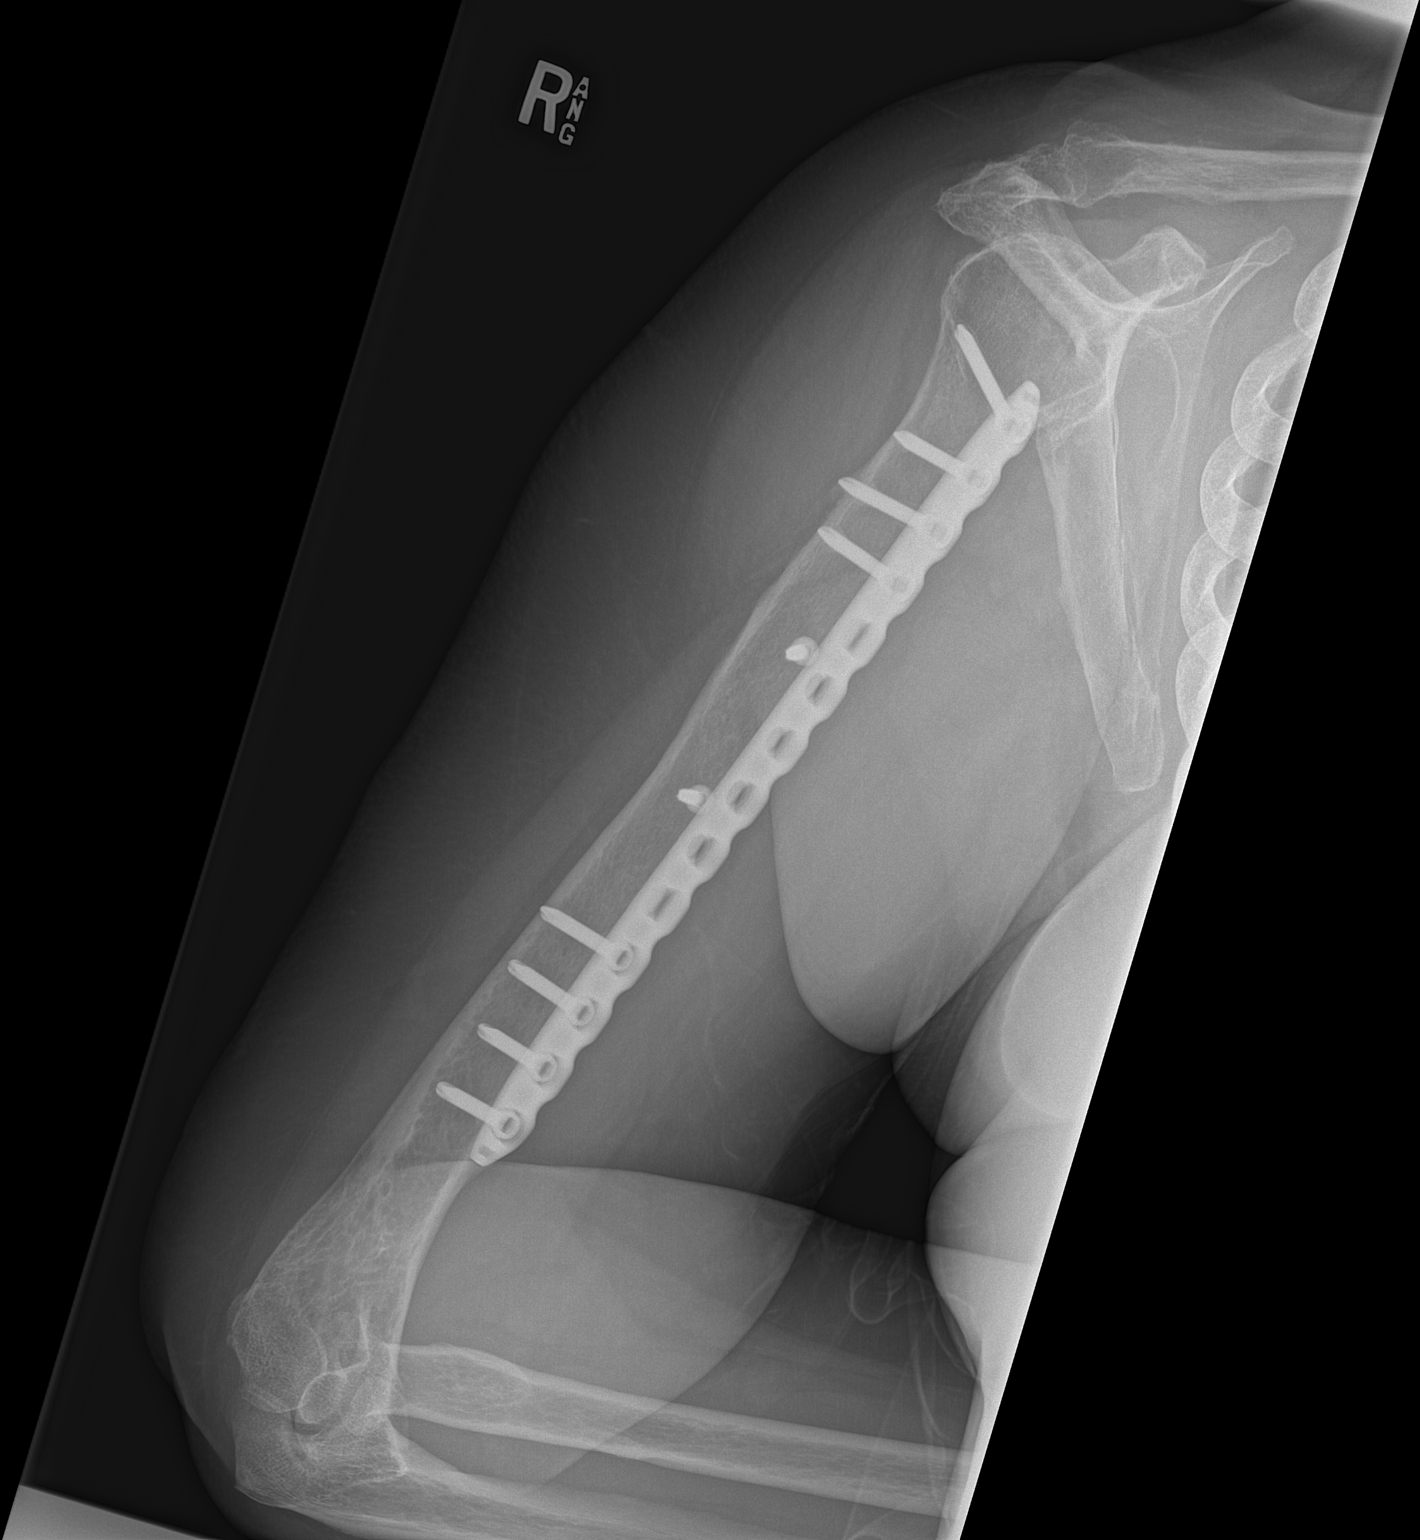

[w humerus ap right]
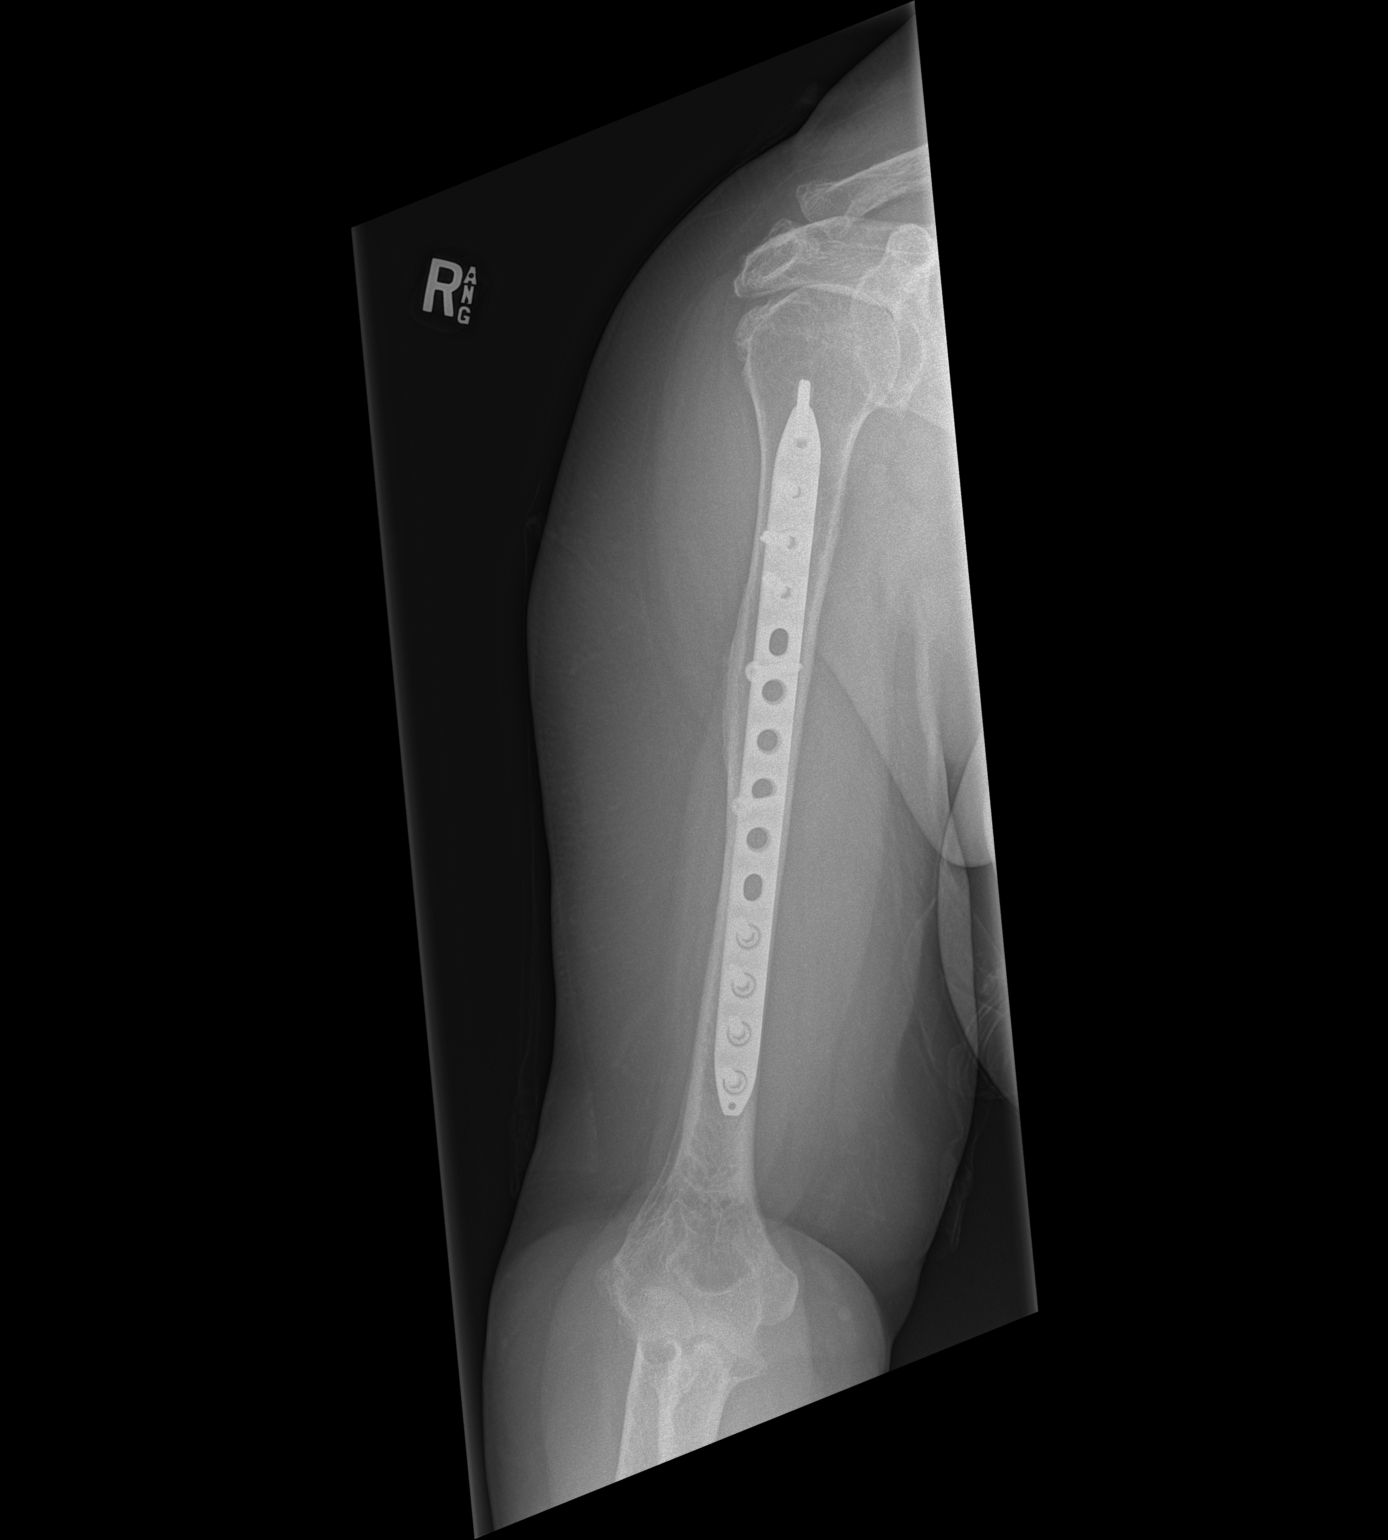

[2 of 2 positions shown; findings below may reference images not displayed]

FINDINGS: No acute fracture or dislocation of the right shoulder. Mild
acromioclavicular and glenohumeral arthrosis.

There is redemonstrated plate and screw fixation along the diaphysis
of the right humerus. No evidence of perihardware fracture or
loosening.

No fracture or dislocation of the right elbow. Mild elbow joint
arthrosis. No elbow joint effusion. Soft tissues are unremarkable.
IMPRESSION: 1. No acute fracture or dislocation of the right shoulder. Mild
acromioclavicular and glenohumeral arthrosis.
2. There is redemonstrated plate and screw fixation along the
diaphysis of the right humerus. No evidence of perihardware fracture
or loosening. No acute fracture.
3. No fracture or dislocation of the right elbow. Mild elbow joint
arthrosis.

## 2022-07-23 DIAGNOSIS — M6281 Muscle weakness (generalized): Secondary | ICD-10-CM | POA: Diagnosis not present

## 2022-07-23 DIAGNOSIS — S76111D Strain of right quadriceps muscle, fascia and tendon, subsequent encounter: Secondary | ICD-10-CM | POA: Diagnosis not present

## 2022-07-23 DIAGNOSIS — R269 Unspecified abnormalities of gait and mobility: Secondary | ICD-10-CM | POA: Diagnosis not present

## 2022-07-25 DIAGNOSIS — F3342 Major depressive disorder, recurrent, in full remission: Secondary | ICD-10-CM | POA: Diagnosis not present

## 2022-07-25 DIAGNOSIS — M6281 Muscle weakness (generalized): Secondary | ICD-10-CM | POA: Diagnosis not present

## 2022-07-25 DIAGNOSIS — S76111D Strain of right quadriceps muscle, fascia and tendon, subsequent encounter: Secondary | ICD-10-CM | POA: Diagnosis not present

## 2022-07-25 DIAGNOSIS — E11319 Type 2 diabetes mellitus with unspecified diabetic retinopathy without macular edema: Secondary | ICD-10-CM | POA: Diagnosis not present

## 2022-07-25 DIAGNOSIS — E039 Hypothyroidism, unspecified: Secondary | ICD-10-CM | POA: Diagnosis not present

## 2022-07-25 DIAGNOSIS — R269 Unspecified abnormalities of gait and mobility: Secondary | ICD-10-CM | POA: Diagnosis not present

## 2022-07-25 DIAGNOSIS — E785 Hyperlipidemia, unspecified: Secondary | ICD-10-CM | POA: Diagnosis not present

## 2022-07-25 DIAGNOSIS — E1121 Type 2 diabetes mellitus with diabetic nephropathy: Secondary | ICD-10-CM | POA: Diagnosis not present

## 2022-07-25 DIAGNOSIS — I10 Essential (primary) hypertension: Secondary | ICD-10-CM | POA: Diagnosis not present

## 2022-07-25 DIAGNOSIS — Z23 Encounter for immunization: Secondary | ICD-10-CM | POA: Diagnosis not present

## 2022-07-30 DIAGNOSIS — S76111D Strain of right quadriceps muscle, fascia and tendon, subsequent encounter: Secondary | ICD-10-CM | POA: Diagnosis not present

## 2022-07-30 DIAGNOSIS — R269 Unspecified abnormalities of gait and mobility: Secondary | ICD-10-CM | POA: Diagnosis not present

## 2022-07-30 DIAGNOSIS — M6281 Muscle weakness (generalized): Secondary | ICD-10-CM | POA: Diagnosis not present

## 2022-07-31 ENCOUNTER — Ambulatory Visit (HOSPITAL_COMMUNITY): Payer: Medicare PPO | Attending: Family

## 2022-07-31 DIAGNOSIS — R0789 Other chest pain: Secondary | ICD-10-CM | POA: Diagnosis not present

## 2022-07-31 LAB — MYOCARDIAL PERFUSION IMAGING
Estimated workload: 1
Exercise duration (min): 1 min
Exercise duration (sec): 0 s
LV dias vol: 133 mL (ref 46–106)
LV sys vol: 60 mL
MPHR: 154 {beats}/min
Nuc Stress EF: 55 %
Peak HR: 75 {beats}/min
Percent HR: 48 %
Rest HR: 68 {beats}/min
Rest Nuclear Isotope Dose: 10.1 mCi
SDS: 6
SRS: 2
SSS: 8
ST Depression (mm): 0 mm
Stress Nuclear Isotope Dose: 32.1 mCi
TID: 0.89

## 2022-07-31 MED ORDER — TECHNETIUM TC 99M TETROFOSMIN IV KIT
32.1000 | PACK | Freq: Once | INTRAVENOUS | Status: AC | PRN
  Administered 2022-07-31: 32.1 via INTRAVENOUS

## 2022-07-31 MED ORDER — TECHNETIUM TC 99M TETROFOSMIN IV KIT
10.1000 | PACK | Freq: Once | INTRAVENOUS | Status: AC | PRN
  Administered 2022-07-31: 10.1 via INTRAVENOUS

## 2022-07-31 MED ORDER — REGADENOSON 0.4 MG/5ML IV SOLN
0.4000 mg | Freq: Once | INTRAVENOUS | Status: AC
  Administered 2022-07-31: 0.4 mg via INTRAVENOUS

## 2022-08-01 ENCOUNTER — Ambulatory Visit (HOSPITAL_COMMUNITY): Payer: Medicare PPO

## 2022-08-02 DIAGNOSIS — M6281 Muscle weakness (generalized): Secondary | ICD-10-CM | POA: Diagnosis not present

## 2022-08-02 DIAGNOSIS — R269 Unspecified abnormalities of gait and mobility: Secondary | ICD-10-CM | POA: Diagnosis not present

## 2022-08-02 DIAGNOSIS — S76111D Strain of right quadriceps muscle, fascia and tendon, subsequent encounter: Secondary | ICD-10-CM | POA: Diagnosis not present

## 2022-08-06 DIAGNOSIS — M0579 Rheumatoid arthritis with rheumatoid factor of multiple sites without organ or systems involvement: Secondary | ICD-10-CM | POA: Diagnosis not present

## 2022-08-07 DIAGNOSIS — E113491 Type 2 diabetes mellitus with severe nonproliferative diabetic retinopathy without macular edema, right eye: Secondary | ICD-10-CM | POA: Diagnosis not present

## 2022-08-08 DIAGNOSIS — M6281 Muscle weakness (generalized): Secondary | ICD-10-CM | POA: Diagnosis not present

## 2022-08-08 DIAGNOSIS — R269 Unspecified abnormalities of gait and mobility: Secondary | ICD-10-CM | POA: Diagnosis not present

## 2022-08-08 DIAGNOSIS — S76111D Strain of right quadriceps muscle, fascia and tendon, subsequent encounter: Secondary | ICD-10-CM | POA: Diagnosis not present

## 2022-08-10 DIAGNOSIS — S76111D Strain of right quadriceps muscle, fascia and tendon, subsequent encounter: Secondary | ICD-10-CM | POA: Diagnosis not present

## 2022-08-10 DIAGNOSIS — R269 Unspecified abnormalities of gait and mobility: Secondary | ICD-10-CM | POA: Diagnosis not present

## 2022-08-10 DIAGNOSIS — M6281 Muscle weakness (generalized): Secondary | ICD-10-CM | POA: Diagnosis not present

## 2022-08-13 DIAGNOSIS — R269 Unspecified abnormalities of gait and mobility: Secondary | ICD-10-CM | POA: Diagnosis not present

## 2022-08-13 DIAGNOSIS — M6281 Muscle weakness (generalized): Secondary | ICD-10-CM | POA: Diagnosis not present

## 2022-08-13 DIAGNOSIS — S76111D Strain of right quadriceps muscle, fascia and tendon, subsequent encounter: Secondary | ICD-10-CM | POA: Diagnosis not present

## 2022-08-14 DIAGNOSIS — H26492 Other secondary cataract, left eye: Secondary | ICD-10-CM | POA: Diagnosis not present

## 2022-08-15 ENCOUNTER — Other Ambulatory Visit: Payer: Self-pay

## 2022-08-15 ENCOUNTER — Ambulatory Visit: Payer: Medicare PPO | Admitting: Podiatry

## 2022-08-15 DIAGNOSIS — N183 Chronic kidney disease, stage 3 unspecified: Secondary | ICD-10-CM

## 2022-08-16 ENCOUNTER — Inpatient Hospital Stay: Payer: Medicare PPO

## 2022-08-16 ENCOUNTER — Other Ambulatory Visit: Payer: Self-pay

## 2022-08-16 ENCOUNTER — Inpatient Hospital Stay: Payer: Medicare PPO | Attending: Hematology and Oncology | Admitting: Adult Health

## 2022-08-16 VITALS — BP 135/59 | HR 65 | Temp 97.3°F | Resp 18 | Ht 64.5 in | Wt 238.5 lb

## 2022-08-16 DIAGNOSIS — E1122 Type 2 diabetes mellitus with diabetic chronic kidney disease: Secondary | ICD-10-CM | POA: Insufficient documentation

## 2022-08-16 DIAGNOSIS — N189 Chronic kidney disease, unspecified: Secondary | ICD-10-CM

## 2022-08-16 DIAGNOSIS — R0789 Other chest pain: Secondary | ICD-10-CM | POA: Diagnosis not present

## 2022-08-16 DIAGNOSIS — N1831 Chronic kidney disease, stage 3a: Secondary | ICD-10-CM | POA: Diagnosis not present

## 2022-08-16 DIAGNOSIS — D631 Anemia in chronic kidney disease: Secondary | ICD-10-CM

## 2022-08-16 DIAGNOSIS — M6281 Muscle weakness (generalized): Secondary | ICD-10-CM | POA: Diagnosis not present

## 2022-08-16 DIAGNOSIS — S76111D Strain of right quadriceps muscle, fascia and tendon, subsequent encounter: Secondary | ICD-10-CM | POA: Diagnosis not present

## 2022-08-16 DIAGNOSIS — R269 Unspecified abnormalities of gait and mobility: Secondary | ICD-10-CM | POA: Diagnosis not present

## 2022-08-16 DIAGNOSIS — D638 Anemia in other chronic diseases classified elsewhere: Secondary | ICD-10-CM | POA: Diagnosis not present

## 2022-08-16 DIAGNOSIS — I129 Hypertensive chronic kidney disease with stage 1 through stage 4 chronic kidney disease, or unspecified chronic kidney disease: Secondary | ICD-10-CM | POA: Insufficient documentation

## 2022-08-16 LAB — CBC WITH DIFFERENTIAL (CANCER CENTER ONLY)
Abs Immature Granulocytes: 0.03 10*3/uL (ref 0.00–0.07)
Basophils Absolute: 0 10*3/uL (ref 0.0–0.1)
Basophils Relative: 1 %
Eosinophils Absolute: 0.2 10*3/uL (ref 0.0–0.5)
Eosinophils Relative: 3 %
HCT: 33.6 % — ABNORMAL LOW (ref 36.0–46.0)
Hemoglobin: 10.5 g/dL — ABNORMAL LOW (ref 12.0–15.0)
Immature Granulocytes: 1 %
Lymphocytes Relative: 22 %
Lymphs Abs: 1.4 10*3/uL (ref 0.7–4.0)
MCH: 29 pg (ref 26.0–34.0)
MCHC: 31.3 g/dL (ref 30.0–36.0)
MCV: 92.8 fL (ref 80.0–100.0)
Monocytes Absolute: 0.5 10*3/uL (ref 0.1–1.0)
Monocytes Relative: 7 %
Neutro Abs: 4.5 10*3/uL (ref 1.7–7.7)
Neutrophils Relative %: 66 %
Platelet Count: 203 10*3/uL (ref 150–400)
RBC: 3.62 MIL/uL — ABNORMAL LOW (ref 3.87–5.11)
RDW: 14.7 % (ref 11.5–15.5)
WBC Count: 6.6 10*3/uL (ref 4.0–10.5)
nRBC: 0 % (ref 0.0–0.2)

## 2022-08-16 NOTE — Progress Notes (Signed)
[facility] Follow up:    Deland Pretty, MD [address] [place] [address]   DIAGNOSIS: Anemia and chronic kidney disease  SUMMARY OF HEMATOLOGIC HISTORY: Aranesp 300 mcg started 05/06/2020.  (Retacrit injections for hemoglobin 10.5 or less) Lab review 03/23/2020: Erythropoietin 27.9, hemoglobin 9.6 02/02/2021: Hemoglobin 10.6 (given her severe symptoms continue with Aranesp) 06/22/2021: Hemoglobin 11.3  11/07/2021: Hemoglobin 11.1 11/21/2021: Hemoglobin 10.5 (no Retacrit today) 01/02/2022: Hemoglobin 10.7 (no Retacrit today): Last Retacrit was on 12/05/2021  02/01/2022: Hemoglobin 10.3: Proceeding to Retacrit  05/24/2022: Hemoglobin 10.4: Retacrit    She usually feels better when the hemoglobin is closer to 11 but because of insurance reasons we are only able to do Retacrit to hemoglobin of 10.5. Return to clinic monthly for lab checks and Retacrit injection if needed.    CURRENT THERAPY: Retacrit  INTERVAL HISTORY: Jocelyn Kaufman 66 y.o. female returns for follow-up of her anemia chronic disease. She does not feel well.  Her hemoglobin is 10.5.  Per her insurance company, they will pay for Retacrit for a hemoglobin of less than 10.5.  She has experienced right sided chest wall pain that is gripping and occurs intermittently.   She has been diagnosed with PVC through cardiology and a cardiac workup ruled out ischemia with a stress test.  She tore a tendon/muscle in her right thigh and has been struggling with recovering and her strength in that leg.  She was at the grocery store on Tuesday and stepped to grab an escaping cart and fell.  She is tearful because of her fatigue and unwell feeling, along with the right sided chest pain.  She has not undergone chest wall imaging previously.     Patient Active Problem List   Diagnosis Date Noted   Abnormal reflex 02/20/2022   Anxiety 02/20/2022   Body mass index (BMI) 40.0-44.9, adult ([place]) 02/20/2022   Breast  tenderness 02/20/2022   Carotid artery occlusion 02/20/2022   Cataract 02/20/2022   Chronic kidney disease, stage 3a ([place]) 02/20/2022   Diabetic renal disease ([place]) 02/20/2022   Diabetic retinopathy associated with type 2 diabetes mellitus ([place]) 02/20/2022   Encounter for general adult medical examination without abnormal findings 02/20/2022   Family history of malignant neoplasm of digestive organs 02/20/2022   Fibromyalgia 02/20/2022   Generalized anxiety disorder 02/20/2022   History of musculoskeletal disease 02/20/2022   Hypothyroid 02/20/2022   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[place]) 02/20/2022   Morbid obesity ([place]) 02/20/2022   Mild intermittent asthma 02/20/2022   Myalgia 02/20/2022   Neuropathy of both feet 02/20/2022   Non-toxic nodular goiter 02/20/2022   Osteoarthritis of knee 02/20/2022   Obstructive sleep apnea syndrome 02/20/2022   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[place]) 02/20/2022   Paroxysmal ventricular tachycardia ([place]) 02/20/2022   Postmenopausal state 02/20/2022   Personal history of transient ischemic attack (TIA), 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 deficits 02/20/2022   Recurrent major depression in full remission ([place]) 02/20/2022   Renal failure syndrome 02/20/2022   Sciatica 02/20/2022   Vitamin D deficiency 02/20/2022   Bilateral hearing loss 08/29/2020   Dysequilibrium 08/29/2020   Anemia in chronic kidney disease 06/17/2020   Anemia of chronic disease 04/25/2020   Impingement syndrome of right shoulder region 11/25/2018   Osteoarthritis of right shoulder 11/03/2018   Pain in joint of right shoulder 11/03/2018   Fall at home, initial encounter 07/06/2018   Displaced spiral fracture of shaft of humerus, right arm, initial encounter for closed fracture  07/06/2018   TIA (transient ischemic attack) 07/06/2018   Allergic rhinitis 07/04/2018   Normochromic normocytic anemia 01/20/2018   Impingement syndrome of left shoulder 12/17/2017    Rheumatoid arthritis ([place]) 04/30/2017   Lesion of left humerus 04/29/2017   Adrenal adenoma 03/19/2017   Neuropathy 03/19/2017   Spinal stenosis 03/19/2017   Pericardial effusion 03/14/2017   Diabetes mellitus ([place]) 03/14/2017   Essential hypertension 03/14/2017   Mixed hyperlipidemia 03/14/2017   Ulnar neuropathy at wrist, 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   Meralgia paresthetica of right side 01/10/2017   History of Cushing disease 03/01/2016   Allergic rhinoconjunctivitis 07/09/2015   Asthma, well controlled 07/09/2015   Laryngopharyngeal reflux (LPR) 07/09/2015    is allergic to ozempic (0.25 or 0.5 mg-dose) [semaglutide(0.25 or 0.'5mg'$ -dos)], jardiance [empagliflozin], latex, tramadol hcl, invokana [canagliflozin], percocet [oxycodone-acetaminophen], prilosec [omeprazole], rogaine [minoxidil], and victoza [liraglutide].  MEDICAL HISTORY: Past Medical History:  Diagnosis Date   Adrenal adenoma    Anemia    Angio-edema    Asthma    Atrial fibrillation ([place])    Cushing disease ([place])    Depression    Diabetes mellitus without complication (HCC)    Fibromyalgia    Hyperlipidemia    Hypertension    Hypothyroidism    Meralgia paresthetica of right side 01/10/2017   Migraine    Pericardial effusion    Proteinuria    Rheumatoid arthritis (H[facility])    Ulnar neuropathy at wrist, 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   Urticaria     SURGICAL HISTORY: Past Surgical History:  Procedure Laterality Date   ADENOIDECTOMY     APPENDECTOMY     CYST REMOVAL LEG Right    Removed from right thigh joint   ORIF HUMERUS FRACTURE Right 07/09/2018   Procedure: OPEN REDUCTION INTERNAL FIXATION (ORIF) HUMERAL SHAFT FRACTURE;  Surgeon: Hiram Gash, MD;  Location: [facility];  Service: Orthopedics;  Laterality: Right;   TONSILLECTOMY     TOTAL VAGINAL HYSTERECTOMY     UMBILICAL HERNIA REPAIR      SOCIAL HISTORY: Social History   Socioeconomic History   Marital status: Single    Spouse name: Not on file   Number of children: 0    Years of education: Masters   Highest education level: Not on file  Occupational History   Occupation: [employer]  Tobacco Use   Smoking status: Never   Smokeless tobacco: Never  Substance and Sexual Activity   Alcohol use: Not Currently    Comment: occ   Drug use: No   Sexual activity: Never  Other Topics Concern   Not on file  Social History Narrative   Lives alone   Caffeine use: 2 cups coffee per day   Tea sometimes   Soda- 3 x/week   Right-handed   Retired.    Masters Degree   No Children   Single.   Social Determinants of Health   Financial Resource Strain: Not on file  Food Insecurity: Not on file  Transportation Needs: Not on file  Physical Activity: Not on file  Stress: Not on file  Social Connections: Not on file  Intimate Partner Violence: Not on file    FAMILY HISTORY: Family History  Problem Relation Age of Onset   Hypertension Mother    Stroke Mother    Kidney failure Mother    Heart disease Mother        heart murmur   Glaucoma Mother    Stroke Father    COPD Father    Cancer - Prostate Father  Colon cancer Father    Diabetes Sister    Thyroid cancer Sister    Epilepsy Sister    Ovarian cancer Paternal Aunt     Review of Systems  Constitutional:  Positive for fatigue. Negative for appetite change, chills, fever and unexpected weight change.  HENT:   Negative for hearing loss, lump/mass and trouble swallowing.   Eyes:  Negative for eye problems and icterus.  Respiratory:  Positive for shortness of breath (h/o asthma). Negative for chest tightness and cough.   Cardiovascular:  Positive for chest pain and palpitations. Negative for leg swelling.  Gastrointestinal:  Negative for abdominal distention, abdominal pain, constipation, diarrhea, nausea and vomiting.  Endocrine: Negative for hot flashes.  Genitourinary:  Negative for difficulty urinating.   Musculoskeletal:  Positive for gait problem. Negative for arthralgias.  Skin:   Negative for itching and rash.  Neurological:  Positive for extremity weakness and gait problem. Negative for dizziness, headaches and numbness.  Hematological:  Negative for adenopathy. Does not bruise/bleed easily.  Psychiatric/Behavioral:  Negative for depression. The patient is not nervous/anxious.       PHYSICAL EXAMINATION  ECOG PERFORMANCE STATUS: 2 - Symptomatic, <50% confined to bed  Vitals:   08/16/22 0916  BP: (!) 135/59  Pulse: 65  Resp: 18  Temp: (!) 97.3 F (36.3 C)  SpO2: 98%    Physical Exam Constitutional:      General: She is not in acute distress.    Appearance: Normal appearance. She is not toxic-appearing.  HENT:     Head: Normocephalic and atraumatic.  Eyes:     General: No scleral icterus. Cardiovascular:     Rate and Rhythm: Normal rate and regular rhythm.     Pulses: Normal pulses.     Heart sounds: Normal heart sounds.  Pulmonary:     Effort: Pulmonary effort is normal.     Breath sounds: Normal breath sounds.  Abdominal:     General: Abdomen is flat. Bowel sounds are normal. There is no distension.     Palpations: Abdomen is soft.     Tenderness: There is no abdominal tenderness.  Musculoskeletal:        General: No swelling.     Cervical back: Neck supple.  Lymphadenopathy:     Cervical: No cervical adenopathy.  Skin:    General: Skin is warm and dry.     Findings: No rash.  Neurological:     General: No focal deficit present.     Mental Status: She is alert.  Psychiatric:        Mood and Affect: Mood normal.        Behavior: Behavior normal.     LABORATORY DATA:  CBC    Component Value Date/Time   WBC 6.6 08/16/2022 0839   WBC 7.3 05/26/2022 1524   RBC 3.62 (L) 08/16/2022 0839   HGB 10.5 (L) 08/16/2022 0839   HCT 33.6 (L) 08/16/2022 0839   PLT 203 08/16/2022 0839   MCV 92.8 08/16/2022 0839   MCH 29.0 08/16/2022 0839   MCHC 31.3 08/16/2022 0839   RDW 14.7 08/16/2022 0839   LYMPHSABS 1.4 08/16/2022 0839   MONOABS 0.5  08/16/2022 0839   EOSABS 0.2 08/16/2022 0839   BASOSABS 0.0 08/16/2022 0839      ASSESSMENT and THERAPY PLAN:   Anemia of chronic disease Darolyn is a 66 year old with h/o anemia of chronic disease.  She continues on Retacrit and will not receive an injection today due to her hemoglobin  of 10.5.    She reviewed her concerns of overall feeling poorly and wants to know what she should do next.  My recommendation includes: CT chest w/o contrast to evaluate persistent CW pain Ferrtin, iron studies, b12 level with next set of labs to evaluate for deficiency Return in 2 weeks for f/u and discussion of the above.    Makenzy is in agreement with the above plan.  We will see her back in 2 weeks for f/u.      All questions were answered. The patient knows to call the clinic with any problems, questions or concerns. We can certainly see the patient much sooner if necessary.  Total encounter time:30 minutes*in face-to-face visit time, chart review, lab review, care coordination, order entry, and documentation of the encounter time.    Wilber Bihari, NP 08/17/22 2:05 PM Medical Oncology and Hematology [facility] [place], [place] [address] Tel. [phone]    Fax. [phone]  *Total Encounter Time as defined by the Centers for Medicare and Medicaid Services includes, in addition to the face-to-face time of a patient visit (documented in the note above) non-face-to-face time: obtaining and reviewing outside history, ordering and reviewing medications, tests or procedures, care coordination (communications with other health care professionals or caregivers) and documentation in the medical record.

## 2022-08-17 ENCOUNTER — Telehealth: Payer: Self-pay | Admitting: Hematology and Oncology

## 2022-08-17 ENCOUNTER — Encounter: Payer: Self-pay | Admitting: Hematology and Oncology

## 2022-08-17 NOTE — Assessment & Plan Note (Signed)
Jocelyn Kaufman is a 66 year old with h/o anemia of chronic disease.  She continues on Retacrit and will not receive an injection today due to her hemoglobin of 10.5.    She reviewed her concerns of overall feeling poorly and wants to know what she should do next.  My recommendation includes: CT chest w/o contrast to evaluate persistent CW pain Ferrtin, iron studies, b12 level with next set of labs to evaluate for deficiency Return in 2 weeks for f/u and discussion of the above.    Jocelyn Kaufman is in agreement with the above plan.  We will see her back in 2 weeks for f/u.

## 2022-08-17 NOTE — Telephone Encounter (Signed)
Spoke with patient confirming upcoming appointments  

## 2022-08-20 ENCOUNTER — Emergency Department (HOSPITAL_BASED_OUTPATIENT_CLINIC_OR_DEPARTMENT_OTHER): Payer: Medicare PPO

## 2022-08-20 ENCOUNTER — Observation Stay (HOSPITAL_BASED_OUTPATIENT_CLINIC_OR_DEPARTMENT_OTHER)
Admission: EM | Admit: 2022-08-20 | Discharge: 2022-08-22 | Disposition: A | Discharge status: Home / Self Care | Payer: Medicare PPO | Attending: Internal Medicine | Admitting: Internal Medicine

## 2022-08-20 ENCOUNTER — Encounter (HOSPITAL_COMMUNITY): Payer: Self-pay

## 2022-08-20 ENCOUNTER — Encounter (HOSPITAL_BASED_OUTPATIENT_CLINIC_OR_DEPARTMENT_OTHER): Payer: Self-pay | Admitting: Emergency Medicine

## 2022-08-20 ENCOUNTER — Emergency Department (HOSPITAL_BASED_OUTPATIENT_CLINIC_OR_DEPARTMENT_OTHER): Payer: Medicare PPO | Admitting: Radiology

## 2022-08-20 ENCOUNTER — Other Ambulatory Visit: Payer: Self-pay

## 2022-08-20 DIAGNOSIS — I4891 Unspecified atrial fibrillation: Secondary | ICD-10-CM | POA: Diagnosis not present

## 2022-08-20 DIAGNOSIS — E039 Hypothyroidism, unspecified: Secondary | ICD-10-CM | POA: Diagnosis not present

## 2022-08-20 DIAGNOSIS — M1990 Unspecified osteoarthritis, unspecified site: Secondary | ICD-10-CM | POA: Diagnosis not present

## 2022-08-20 DIAGNOSIS — R778 Other specified abnormalities of plasma proteins: Secondary | ICD-10-CM | POA: Diagnosis not present

## 2022-08-20 DIAGNOSIS — R0789 Other chest pain: Secondary | ICD-10-CM | POA: Diagnosis not present

## 2022-08-20 DIAGNOSIS — I2699 Other pulmonary embolism without acute cor pulmonale: Secondary | ICD-10-CM | POA: Diagnosis not present

## 2022-08-20 DIAGNOSIS — Z7982 Long term (current) use of aspirin: Secondary | ICD-10-CM | POA: Insufficient documentation

## 2022-08-20 DIAGNOSIS — J45909 Unspecified asthma, uncomplicated: Secondary | ICD-10-CM | POA: Diagnosis not present

## 2022-08-20 DIAGNOSIS — Z79899 Other long term (current) drug therapy: Secondary | ICD-10-CM | POA: Diagnosis not present

## 2022-08-20 DIAGNOSIS — D649 Anemia, unspecified: Secondary | ICD-10-CM | POA: Diagnosis not present

## 2022-08-20 DIAGNOSIS — Z7985 Long-term (current) use of injectable non-insulin antidiabetic drugs: Secondary | ICD-10-CM | POA: Insufficient documentation

## 2022-08-20 DIAGNOSIS — M25569 Pain in unspecified knee: Secondary | ICD-10-CM | POA: Diagnosis not present

## 2022-08-20 DIAGNOSIS — I10 Essential (primary) hypertension: Secondary | ICD-10-CM | POA: Diagnosis not present

## 2022-08-20 DIAGNOSIS — E0800 Diabetes mellitus due to underlying condition with hyperosmolarity without nonketotic hyperglycemic-hyperosmolar coma (NKHHC): Secondary | ICD-10-CM

## 2022-08-20 DIAGNOSIS — M79643 Pain in unspecified hand: Secondary | ICD-10-CM | POA: Diagnosis not present

## 2022-08-20 DIAGNOSIS — Z91040 Latex allergy status: Secondary | ICD-10-CM | POA: Insufficient documentation

## 2022-08-20 DIAGNOSIS — M797 Fibromyalgia: Secondary | ICD-10-CM | POA: Diagnosis not present

## 2022-08-20 DIAGNOSIS — R0602 Shortness of breath: Secondary | ICD-10-CM | POA: Diagnosis present

## 2022-08-20 DIAGNOSIS — M1711 Unilateral primary osteoarthritis, right knee: Secondary | ICD-10-CM | POA: Diagnosis not present

## 2022-08-20 DIAGNOSIS — E1165 Type 2 diabetes mellitus with hyperglycemia: Secondary | ICD-10-CM | POA: Insufficient documentation

## 2022-08-20 DIAGNOSIS — I2693 Single subsegmental pulmonary embolism without acute cor pulmonale: Principal | ICD-10-CM | POA: Insufficient documentation

## 2022-08-20 DIAGNOSIS — Z7901 Long term (current) use of anticoagulants: Secondary | ICD-10-CM | POA: Insufficient documentation

## 2022-08-20 DIAGNOSIS — I2694 Multiple subsegmental pulmonary emboli without acute cor pulmonale: Secondary | ICD-10-CM | POA: Diagnosis not present

## 2022-08-20 DIAGNOSIS — R7989 Other specified abnormal findings of blood chemistry: Secondary | ICD-10-CM | POA: Insufficient documentation

## 2022-08-20 DIAGNOSIS — M7989 Other specified soft tissue disorders: Secondary | ICD-10-CM | POA: Diagnosis not present

## 2022-08-20 DIAGNOSIS — Z794 Long term (current) use of insulin: Secondary | ICD-10-CM | POA: Insufficient documentation

## 2022-08-20 DIAGNOSIS — M0579 Rheumatoid arthritis with rheumatoid factor of multiple sites without organ or systems involvement: Secondary | ICD-10-CM | POA: Diagnosis not present

## 2022-08-20 LAB — BASIC METABOLIC PANEL
Anion gap: 9 (ref 5–15)
BUN: 25 mg/dL — ABNORMAL HIGH (ref 8–23)
CO2: 24 mmol/L (ref 22–32)
Calcium: 9.1 mg/dL (ref 8.9–10.3)
Chloride: 105 mmol/L (ref 98–111)
Creatinine, Ser: 1 mg/dL (ref 0.44–1.00)
GFR, Estimated: >60 mL/min (ref >60)
Glucose, Bld: 123 mg/dL — ABNORMAL HIGH (ref 70–99)
Potassium: 4.3 mmol/L (ref 3.5–5.1)
Sodium: 138 mmol/L (ref 135–145)

## 2022-08-20 LAB — CBC
HCT: 33.2 % — ABNORMAL LOW (ref 36.0–46.0)
Hemoglobin: 10.2 g/dL — ABNORMAL LOW (ref 12.0–15.0)
MCH: 28.1 pg (ref 26.0–34.0)
MCHC: 30.7 g/dL (ref 30.0–36.0)
MCV: 91.5 fL (ref 80.0–100.0)
Platelets: 188 10*3/uL (ref 150–400)
RBC: 3.63 MIL/uL — ABNORMAL LOW (ref 3.87–5.11)
RDW: 14.6 % (ref 11.5–15.5)
WBC: 5.8 10*3/uL (ref 4.0–10.5)
nRBC: 0 % (ref 0.0–0.2)

## 2022-08-20 LAB — BRAIN NATRIURETIC PEPTIDE: B Natriuretic Peptide: 186.3 pg/mL — ABNORMAL HIGH (ref 0.0–100.0)

## 2022-08-20 LAB — TROPONIN I (HIGH SENSITIVITY)
Troponin I (High Sensitivity): 34 ng/L — ABNORMAL HIGH (ref <18)
Troponin I (High Sensitivity): 45 ng/L — ABNORMAL HIGH (ref <18)

## 2022-08-20 LAB — CBG MONITORING, ED: Glucose-Capillary: 90 mg/dL (ref 70–99)

## 2022-08-20 LAB — HEPARIN LEVEL (UNFRACTIONATED): Heparin Unfractionated: 0.91 IU/mL — ABNORMAL HIGH (ref 0.30–0.70)

## 2022-08-20 MED ORDER — HEPARIN (PORCINE) 25000 UT/250ML-% IV SOLN
1100.0000 [IU]/h | INTRAVENOUS | Status: DC
  Administered 2022-08-20: 1400 [IU]/h via INTRAVENOUS
  Filled 2022-08-20: qty 250

## 2022-08-20 MED ORDER — HEPARIN BOLUS VIA INFUSION
5500.0000 [IU] | Freq: Once | INTRAVENOUS | Status: AC
  Administered 2022-08-20: 5500 [IU] via INTRAVENOUS

## 2022-08-20 MED ORDER — HYDROMORPHONE HCL 1 MG/ML IJ SOLN
0.5000 mg | INTRAMUSCULAR | Status: DC | PRN
  Administered 2022-08-21 (×2): 0.5 mg via INTRAVENOUS
  Filled 2022-08-20 (×2): qty 0.5

## 2022-08-20 MED ORDER — IOHEXOL 350 MG/ML SOLN
100.0000 mL | Freq: Once | INTRAVENOUS | Status: AC | PRN
  Administered 2022-08-20: 100 mL via INTRAVENOUS

## 2022-08-20 MED ORDER — LEVOTHYROXINE SODIUM 125 MCG PO TABS
125.0000 ug | ORAL_TABLET | Freq: Every day | ORAL | Status: DC
  Administered 2022-08-21 – 2022-08-22 (×2): 125 ug via ORAL
  Filled 2022-08-20 (×3): qty 1

## 2022-08-20 MED ORDER — ACETAMINOPHEN 325 MG PO TABS
650.0000 mg | ORAL_TABLET | Freq: Four times a day (QID) | ORAL | Status: DC | PRN
  Administered 2022-08-21 – 2022-08-22 (×2): 650 mg via ORAL
  Filled 2022-08-20 (×2): qty 2

## 2022-08-20 MED ORDER — OXYCODONE HCL 5 MG PO TABS
5.0000 mg | ORAL_TABLET | Freq: Four times a day (QID) | ORAL | Status: DC | PRN
  Administered 2022-08-20 – 2022-08-21 (×4): 5 mg via ORAL
  Filled 2022-08-20 (×5): qty 1

## 2022-08-20 MED ORDER — LORAZEPAM 0.5 MG PO TABS: 0.5000 mg | ORAL_TABLET | ORAL | Status: DC | PRN

## 2022-08-20 MED ORDER — HYDRALAZINE HCL 50 MG PO TABS
50.0000 mg | ORAL_TABLET | Freq: Three times a day (TID) | ORAL | Status: DC
  Administered 2022-08-20 – 2022-08-22 (×5): 50 mg via ORAL
  Filled 2022-08-20 (×5): qty 1

## 2022-08-20 MED ORDER — HYDRALAZINE HCL 20 MG/ML IJ SOLN: 5.0000 mg | Freq: Four times a day (QID) | INTRAMUSCULAR | Status: DC | PRN

## 2022-08-20 MED ORDER — LEVOTHYROXINE SODIUM 25 MCG PO TABS: 125.0000 ug | ORAL_TABLET | Freq: Every day | ORAL | Status: DC

## 2022-08-20 NOTE — Plan of Care (Addendum)
[facility]  Jocelyn Kaufman (06-Oct-2056) MRN [id number]  The patient is a 66 year old female with diabetes mellitus type 2, GERD, rheumatoid arthritis, hypertension, hyperlipidemia, and other comorbidities who presented to the ED with right chest pain for a few weeks that is worsened on 1017 after a fall.  Her symptoms include shortness of breath and she reported multiple falls in the last few weeks.  Given her chest discomfort she went to the ED for further evaluation and a CTA PE protocol shows a small PE.  Patient recently had surgical intervention where she had a quadriceps muscle repair surgery and has right lower extremity swelling that has been worsening recently.  She states that she did have several falls and had a fall yesterday as she is going to feed her cat but not a head strike.  CTA of the chest showed acute PE and lower extremity venous duplex showed no evidence of DVT.  She was placed on a heparin drip and will need further work-up for PE and echocardiogram.  She had a slight troponin bump and will need further troponin monitoring.  Pain appears to be pleuritic and currently she is not hypoxic but will need an amatory home O2 screen prior to discharge.  Of note she had a recent stress test on 08/01/2019 which was within normal limits to evaluate her for her chest discomfort.  She is accepted to a telemetry bed OBS and remains on a heparin drip and can be transition to oral DOAC likely in the next 24 to 48 hours.  ADDENDUM 1641** Patient had to be placed on 2 Liters of O2 due to Desaturation to 88% Prior to Transfer.

## 2022-08-20 NOTE — ED Notes (Addendum)
When pt was placed in an upright position, she felt a sharp pain in her right upper chest. Pt stated that it feltt like a muscle spasm. Pain resolved as quick as it started once pt reached final resting position.this happened 2 x when sting up to auscaltate lungs and then a 3rd time while pt was at rest. Pain resolved quickly.

## 2022-08-20 NOTE — ED Notes (Signed)
Report given to [facility] 

## 2022-08-20 NOTE — ED Triage Notes (Signed)
Pt arrives to ED with c/o right chest pain "for a couple of weeks" that worsened on 10/17 after a fall. Associated symptoms include SOB. She reports multiple falls over last two weeks.

## 2022-08-20 NOTE — ED Notes (Signed)
Pt has acute pain in right chest while twisting and reaching back.

## 2022-08-20 NOTE — ED Notes (Signed)
ED Provider at bedside. 

## 2022-08-20 NOTE — ED Provider Notes (Signed)
Blood pressure (!) 161/84, pulse 67, temperature 98.7 F (37.1 C), resp. rate (!) 30, SpO2 96 %.  Assuming care from Dr. Philip Aspen.  In short, Jocelyn Kaufman is a 66 y.o. female with a chief complaint of Chest Pain .  Refer to the original H&P for additional details.  The current plan of care is to follow up on CTA and repeat troponin.  CTA independently evaluated and agree with radiology interpretation.  There is a small subsegmental PE which correlates with patient's area of discomfort.  She has mild elevation in troponin.  Plan for admit. Heparin started.   Discussed patient's case with [facility] to request admission. Patient and family (if present) updated with plan.   I reviewed all nursing notes, vitals, pertinent old records, EKGs, labs, imaging (as available).  04:40 PM  Made aware by nursing that patient now with O2 sat in the 88-91% range on room air.  Placing patient on 2 L.  She is in no distress.  No hypotension or severe tachycardia. No change in level of care.   CRITICAL CARE Performed by: Jocelyn Kaufman Total critical care time: 35 minutes Critical care time was exclusive of separately billable procedures and treating other patients. Critical care was necessary to treat or prevent imminent or life-threatening deterioration. Critical care was time spent personally by me on the following activities: development of treatment plan with patient and/or surrogate as well as nursing, discussions with consultants, evaluation of patient's response to treatment, examination of patient, obtaining history from patient or surrogate, ordering and performing treatments and interventions, ordering and review of laboratory studies, ordering and review of radiographic studies, pulse oximetry and re-evaluation of patient's condition.  Jocelyn Quinton, MD Emergency Medicine     Ermalee Mealy, [facility], MD 08/20/22 [phone]

## 2022-08-20 NOTE — ED Provider Notes (Signed)
[facility] EMERGENCY DEPT Provider Note   CSN: [id number] Arrival date & time: 08/20/22  1219     History  Chief Complaint  Patient presents with   Chest Pain    Jocelyn Kaufman is a 66 y.o. female.  66 year old female with a history of hypertension, hyperlipidemia, diabetes, and rheumatoid arthritis who presents to the emergency department with chest pain.  No exertional component to the pain.  Patient states that for the past several weeks she has had right-sided chest pain that feels like a strained muscle.  Denies any injuries leading to her chest pain.  Says it is pleuritic and has mild shortness of breath associated with it but denies any cough or fevers.  Says that she has had a recent quadricep muscle repair surgery and has had right lower extremity swelling that has been worsening recently.  Says that she did have several falls recently but has not had any injuries from them.  Did have a fall yesterday when she was going to feed her cat but denied any head strike or LOC.  Did see primary care doctor recently who would like for her to have a CT of her chest to evaluate for the cause of her chest pain.  Also had a stress test on 07/31/2022 which was WNL.   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 Authorizing Provider  acetaminophen (TYLENOL) 500 MG tablet Take 1,000 mg by mouth every 6 (six) hours as needed for mild pain or headache.    Yes [provider]  Aflibercept (EYLEA) 2 MG/0.05ML SOLN 2 mg by Intravitreal route every 3 (three) months. 04/25/20  Yes Nicholas Lose, MD  albuterol (VENTOLIN HFA) 108 (90 Base) MCG/ACT inhaler Inhale two puffs every four to six hours as needed for cough or wheeze. Patient taking differently: Inhale 2 puffs into the lungs See admin instructions. Inhale 2 puffs into the lungs every 4-6 hours as needed for coughing or wheezing 10/05/21  Yes Padgett, Rae Halsted, MD  amLODipine (NORVASC) 10 MG tablet Take 1 tablet (10 mg total) by mouth daily. 02/01/22  Yes Nicholas Lose, MD  aspirin EC 81 MG tablet Take 1 tablet (81 mg total) by mouth daily. Swallow whole. 08/12/20  Yes Nicholas Lose, MD  atorvastatin (LIPITOR) 40 MG tablet Take 1 tablet (40 mg total) by mouth daily at 6 PM. Patient taking differently: Take 40 mg by mouth daily. 07/12/18  Yes Dana Allan I, MD  CHELATED IRON PO Take 18 mg by mouth daily.   Yes [provider]  Cholecalciferol (VITAMIN D3) 125 MCG (5000 UT) CAPS Take 5,000 Units by mouth daily.   Yes [provider]  Dulaglutide (TRULICITY) 1.5 ZX/2.8FV SOPN Inject 1.5 mg into the skin every Tuesday.   Yes [provider]  EPINEPHrine (EPIPEN 2-PAK) 0.3 mg/0.3 mL IJ  SOAJ injection Use as directed for life-threatening allergic reaction. 04/05/22  Yes Kennith Gain, MD  epoetin alfa-epbx (RETACRIT) 2000 UNIT/ML injection See admin instructions.   Yes [provider]  escitalopram (LEXAPRO) 20 MG tablet Take 20 mg by mouth daily.   Yes [provider]  ezetimibe (ZETIA) 10 MG tablet Take 1 tablet (10 mg total) by mouth daily. 04/25/20  Yes Nicholas Lose, MD  fexofenadine (ALLEGRA ALLERGY) 180 MG tablet Take 1 tablet (180 mg total) by mouth daily. 11/21/21  Yes Nicholas Lose, MD  folic acid (FOLVITE) 1 MG tablet Take 2 mg by mouth daily.   Yes [provider]  hydrALAZINE (APRESOLINE) 50 MG tablet Take 50 mg by  mouth 3 (three) times daily.   Yes [provider]  insulin aspart (NOVOLOG) 100 UNIT/ML FlexPen Inject 20 Units into the skin See admin instructions. Inject 20 units into the skin two to three times a day before meals   Yes [provider]  insulin glargine, 2 Unit Dial, (TOUJEO MAX SOLOSTAR) 300 UNIT/ML Solostar Pen Inject 46 Units into the skin daily. Patient taking differently: Inject 42 Units into the skin at bedtime. 02/02/21  Yes Nicholas Lose, MD  ipratropium (ATROVENT) 0.06 % nasal spray Place 1-2 sprays each nostril as needed up to 3 times a day Patient taking differently: Place 1-2 sprays into both nostrils 3 (three) times daily as needed for rhinitis. 04/05/22  Yes Padgett, Rae Halsted, MD  irbesartan (AVAPRO) 300 MG tablet Take 1 tablet (300 mg total) by mouth daily. 02/01/22  Yes Nicholas Lose, MD  latanoprost (XALATAN) 0.005 % ophthalmic solution Place 1 drop into both eyes at bedtime.   Yes [provider]  LORazepam (ATIVAN) 0.5 MG tablet Take 0.5 mg by mouth daily as needed for anxiety.   Yes [provider]  metaxalone (SKELAXIN) 800 MG tablet Take 800 mg by mouth 3 (three) times daily as needed for muscle spasms.   Yes [provider]  Methotrexate Sodium (METHOTREXATE, PF,) 50 MG/2ML injection Inject 20 mg into the muscle every Wednesday. 06/26/22  Yes [provider]  metoprolol succinate (TOPROL-XL) 25 MG 24 hr tablet Take 1 tablet (25 mg total) by mouth daily. May take additional half tablet as needed once daily for palpitations. Patient taking differently: Take 12.5-25 mg by mouth See admin instructions. Take 25 mg by mouth once a day and an additional 12.5 mg (half tablet) as needed once daily for palpitations 05/07/22  Yes Loel Dubonnet, NP  Multiple Vitamins-Minerals (CENTRUM SILVER PO) Take 1 tablet by mouth daily with breakfast.   Yes [provider]  ondansetron (ZOFRAN) 4 MG tablet Take 4 mg by mouth every 8  (eight) hours as needed for nausea or vomiting.   Yes [provider]  sulfaSALAzine (AZULFIDINE) 500 MG tablet Take 3 tablets (1,500 mg total) by mouth 2 (two) times daily. 11/21/21  Yes Nicholas Lose, MD  SYNTHROID 125 MCG tablet Take 125 mcg by mouth daily before breakfast.   Yes [provider]  tocilizumab (ACTEMRA) 400 MG/20ML SOLN injection Inject into the vein every 28 (twenty-eight) days. 02/08/22  Yes [provider]  triamcinolone cream (KENALOG) 0.1 % Apply 1 Application topically 3 (three) times daily as needed (for itching- affected areas). 08/03/22  Yes [provider]  vitamin B-12 (CYANOCOBALAMIN) 1000 MCG tablet Take 1,000 mcg by mouth daily.   Yes [provider]  Accu-Chek Softclix Lancets lancets use to check blood sugar 04/26/21  [provider]  APIXABAN Arne Cleveland) VTE STARTER PACK (10MG AND 5MG) Take as directed on package: start with two-74m tablets twice daily for 7 days. On day 8, switch to one-571mtablet twice daily. 08/21/22   GhBarb MerinoMD  B-D ULTRAFINE III SHORT PEN 31G X 8 MM MISC SMARTSIG:1 Each SUB-Q Daily 02/12/20   [provider]  Beclomethasone Dipropionate (QVAR IN) Inhale 2 puffs into the lungs 3 (three) times daily as needed.    [provider]  ciclopirox (PENLAC) 8 % solution Apply topically at bedtime. Apply over nail and surrounding skin. Apply daily over previous coat. After seven (7) days, may remove with alcohol and continue cycle. Patient not taking: Reported on 08/20/2022 11/09/21   McCriselda PeachesDPM  COVID-19 mRNA bivalent vaccine, Pfizer, (PFIZER COVID-19 VAC BIVALENT) injection Inject into the muscle. Patient not taking: Reported on 08/20/2022 12/01/21   SnCarlyle BasquesMD  HYDROcodone-acetaminophen ([facility]325 MG tablet Take 1 tablet by mouth every 6 (six) hours as needed for severe pain. 08/21/22   GhBarb MerinoMD  insulin aspart protamine - aspart (NOVOLOG 70/30  FLEXPEN RELION) (70-30) 100 UNIT/ML FlexPen Inject 0.15-0.2 mLs (15-20 Units total) into the skin in the morning, at noon, and at bedtime. Patient not taking: Reported on 08/20/2022 08/12/20   GuNicholas LoseMD  Lancets Misc. (ACCU-CHEK SOFTCLIX LANCET DEV) KIT See admin instructions. 04/25/21   [provider]  levothyroxine (SYNTHROID) 125 MCG tablet Take 125 mcg by mouth daily before breakfast. BRAND ONLY Patient not taking: Reported on 08/20/2022    [provider]      Allergies    Semaglutide(0.25 or 0.75m51mos), Shellfish-derived products, Empagliflozin, Oxycodone-acetaminophen, Rho (d) immune globulin, Semaglutide, Tramadol hcl, Canagliflozin, Latex, Liraglutide, Minoxidil, Omeprazole, and Percocet [oxycodone-acetaminophen]    Review of Systems   Review of Systems  Physical Exam Updated Vital Signs BP (!) 135/58   Pulse 65   Temp 98.4 F (36.9 C) (Oral)   Resp 18   Ht '5\' 5"'  (1.651 m)   Wt 109.1 kg   SpO2 94%   BMI 40.02 kg/m  Physical Exam Vitals and nursing note reviewed.  Constitutional:      General: She is not in acute distress.    Appearance: She is well-developed.  HENT:     Head: Normocephalic and atraumatic.     Right Ear: External ear normal.     Left Ear: External ear normal.     Nose: Nose normal.  Eyes:     Extraocular Movements: Extraocular movements intact.     Conjunctiva/sclera: Conjunctivae normal.     Pupils: Pupils are equal, round, and reactive to light.  Cardiovascular:     Rate and Rhythm: Normal rate and regular rhythm.     Heart sounds: No murmur heard. Pulmonary:     Effort: Pulmonary effort is normal. No respiratory distress.     Breath sounds: Normal breath sounds.  Abdominal:     General: Abdomen is flat. There is no distension.     Palpations: Abdomen is soft. There is no mass.     Tenderness: There is no abdominal tenderness. There is no guarding.  Musculoskeletal:        General: No swelling.     Cervical back:  Normal range of motion and neck supple.     Right lower leg: Edema (2+) present.     Left lower leg: No edema.     Comments: Ecchymoses on right medial thigh  Skin:  General: Skin is warm and dry.     Capillary Refill: Capillary refill takes less than 2 seconds.  Neurological:     Mental Status: She is alert and oriented to person, place, and time. Mental status is at baseline.  Psychiatric:        Mood and Affect: Mood normal.     ED Results / Procedures / Treatments   Labs (all labs ordered are listed, but only abnormal results are displayed) Labs Reviewed  BASIC METABOLIC PANEL - Abnormal; Notable for the following components:      Result Value   Glucose, Bld 123 (*)    BUN 25 (*)    All other components within normal limits  CBC - Abnormal; Notable for the following components:   RBC 3.63 (*)    Hemoglobin 10.2 (*)    HCT 33.2 (*)    All other components within normal limits  BRAIN NATRIURETIC PEPTIDE - Abnormal; Notable for the following components:   B Natriuretic Peptide 186.3 (*)    All other components within normal limits  HEPARIN LEVEL (UNFRACTIONATED) - Abnormal; Notable for the following components:   Heparin Unfractionated 0.91 (*)    All other components within normal limits  CBC - Abnormal; Notable for the following components:   RBC 3.63 (*)    Hemoglobin 10.4 (*)    HCT 34.3 (*)    All other components within normal limits  COMPREHENSIVE METABOLIC PANEL - Abnormal; Notable for the following components:   Creatinine, Ser 1.08 (*)    Calcium 8.5 (*)    Total Protein 5.3 (*)    Albumin 3.3 (*)    GFR, Estimated 57 (*)    All other components within normal limits  PHOSPHORUS - Abnormal; Notable for the following components:   Phosphorus 4.7 (*)    All other components within normal limits  GLUCOSE, CAPILLARY - Abnormal; Notable for the following components:   Glucose-Capillary 121 (*)    All other components within normal limits  TROPONIN I (HIGH  SENSITIVITY) - Abnormal; Notable for the following components:   Troponin I (High Sensitivity) 34 (*)    All other components within normal limits  TROPONIN I (HIGH SENSITIVITY) - Abnormal; Notable for the following components:   Troponin I (High Sensitivity) 45 (*)    All other components within normal limits  HIV ANTIBODY (ROUTINE TESTING W REFLEX)  MAGNESIUM  HEMOGLOBIN A1C  GLUCOSE, CAPILLARY  GLUCOSE, CAPILLARY  CBC  CBG MONITORING, ED    EKG EKG Interpretation  Date/Time:  Monday August 20 2022 12:29:01 EDT Ventricular Rate:  66 PR Interval:  190 QRS Duration: 86 QT Interval:  422 QTC Calculation: 442 R Axis:   -56 Text Interpretation: Normal sinus rhythm Left anterior fascicular block Possible Anterior infarct (cited on or before 06-Jul-2018) Abnormal ECG When compared with ECG of 06-Jul-2018 11:37, No significant change was found Confirmed by Margaretmary Eddy [phone]) on 08/20/2022 3:06:02 PM  Radiology ECHOCARDIOGRAM COMPLETE  Result Date: 08/21/2022    ECHOCARDIOGRAM REPORT   Patient Name:   JAIRY ANGULO Date of Exam: 08/21/2022 Medical Rec #:  [id number]    Height:       64.5 in Accession #:    [id number]   Weight:       238.5 lb Date of Birth:  09/01/1956    BSA:          2.120 m Patient Age:    9 years     BP:  134/73 mmHg Patient Gender: F            HR:           63 bpm. Exam Location:  Inpatient Procedure: 2D Echo, Cardiac Doppler and Color Doppler Indications:    Pulmonary Embolus I26.09  History:        Patient has prior history of Echocardiogram examinations, most                 recent 05/18/2022. Arrythmias:Atrial Fibrillation; Risk                 Factors:Hypertension, Dyslipidemia and Diabetes.  Sonographer:    Bernadene Person RDCS Referring Phys: [id number] [place]  1. Left ventricular ejection fraction, by estimation, is 45 to 50%. Left ventricular ejection fraction by 2D MOD biplane is 49.1 %. The left ventricle has mildly decreased  function. The left ventricle demonstrates global hypokinesis. Indeterminate diastolic filling due to E-A fusion.  2. Right ventricular systolic function is normal. The right ventricular size is normal. Tricuspid regurgitation signal is inadequate for assessing PA pressure.  3. A small pericardial effusion is present. The pericardial effusion is circumferential.  4. The mitral valve is degenerative. Mild mitral valve regurgitation. No evidence of mitral stenosis.  5. The aortic valve is tricuspid. There is mild calcification of the aortic valve. There is mild thickening of the aortic valve. Aortic valve regurgitation is not visualized. Aortic valve sclerosis/calcification is present, without any evidence of aortic stenosis.  6. The inferior vena cava is normal in size with greater than 50% respiratory variability, suggesting right atrial pressure of 3 mmHg. Comparison(s): Changes from prior study are noted. EF mildly reduced on this study. FINDINGS  Left Ventricle: Left ventricular ejection fraction, by estimation, is 45 to 50%. Left ventricular ejection fraction by 2D MOD biplane is 49.1 %. The left ventricle has mildly decreased function. The left ventricle demonstrates global hypokinesis. The left ventricular internal cavity size was normal in size. There is no left ventricular hypertrophy. Indeterminate diastolic filling due to E-A fusion. Right Ventricle: The right ventricular size is normal. No increase in right ventricular wall thickness. Right ventricular systolic function is normal. Tricuspid regurgitation signal is inadequate for assessing PA pressure. Left Atrium: Left atrial size was normal in size. Right Atrium: Right atrial size was normal in size. Pericardium: A small pericardial effusion is present. The pericardial effusion is circumferential. Mitral Valve: The mitral valve is degenerative in appearance. Mild to moderate mitral annular calcification. Mild mitral valve regurgitation. No evidence of  mitral valve stenosis. Tricuspid Valve: The tricuspid valve is grossly normal. Tricuspid valve regurgitation is trivial. No evidence of tricuspid stenosis. Aortic Valve: The aortic valve is tricuspid. There is mild calcification of the aortic valve. There is mild thickening of the aortic valve. Aortic valve regurgitation is not visualized. Aortic valve sclerosis/calcification is present, without any evidence of aortic stenosis. Pulmonic Valve: The pulmonic valve was grossly normal. Pulmonic valve regurgitation is trivial. No evidence of pulmonic stenosis. Aorta: The aortic root and ascending aorta are structurally normal, with no evidence of dilitation. Venous: The inferior vena cava is normal in size with greater than 50% respiratory variability, suggesting right atrial pressure of 3 mmHg. IAS/Shunts: The atrial septum is grossly normal.  LEFT VENTRICLE PLAX 2D                        Biplane EF (MOD) LVIDd:  5.60 cm         LV Biplane EF:   Left LVIDs:         4.20 cm                          ventricular LV PW:         1.10 cm                          ejection LV IVS:        1.00 cm                          fraction by LVOT diam:     2.10 cm                          2D MOD LV SV:         114                              biplane is LV SV Index:   54                               49.1 %. LVOT Area:     3.46 cm                                Diastology                                LV e' medial:    4.33 cm/s LV Volumes (MOD)               LV E/e' medial:  34.9 LV vol d, MOD    93.6 ml       LV e' lateral:   3.83 cm/s A2C:                           LV E/e' lateral: 39.4 LV vol d, MOD    101.0 ml A4C: LV vol s, MOD    46.8 ml A2C: LV vol s, MOD    52.0 ml A4C: LV SV MOD A2C:   46.8 ml LV SV MOD A4C:   101.0 ml LV SV MOD BP:    47.8 ml RIGHT VENTRICLE RV S prime:     9.83 cm/s TAPSE (M-mode): 1.9 cm LEFT ATRIUM             Index        RIGHT ATRIUM           Index LA diam:        4.60 cm 2.17 cm/m   RA Area:      14.90 cm LA Vol (A2C):   68.3 ml 32.22 ml/m  RA Volume:   30.30 ml  14.29 ml/m LA Vol (A4C):   64.8 ml 30.57 ml/m LA Biplane Vol: 67.7 ml 31.94 ml/m  AORTIC VALVE              PULMONIC VALVE LVOT Vmax:   159.00 cm/s  PR End Diast Vel: 4.16 msec LVOT Vmean:  105.000 cm/s LVOT VTI:  0.330 m  AORTA Ao Root diam: 2.90 cm Ao Asc diam:  3.50 cm MITRAL VALVE MV Area (PHT): 4.60 cm     SHUNTS MV Decel Time: 165 msec     Systemic VTI:  0.33 m MV E velocity: 151.00 cm/s  Systemic Diam: 2.10 cm MV A velocity: 34.50 cm/s MV E/A ratio:  4.38 Eleonore Chiquito MD Electronically signed by Eleonore Chiquito MD Signature Date/Time: 08/21/2022/10:35:22 AM    Final    US Venous Img Lower Unilateral Right  Result Date: 08/20/2022 CLINICAL DATA:  RLE swelling. Bruising. Pain. Recent diastole injury EXAM: Right LOWER EXTREMITY VENOUS DOPPLER ULTRASOUND TECHNIQUE: Gray-scale sonography with compression, as well as color and duplex ultrasound, were performed to evaluate the deep venous system(s) from the level of the common femoral vein through the popliteal and proximal calf veins. COMPARISON:  None Available. FINDINGS: VENOUS Normal compressibility of the common femoral, superficial femoral, and popliteal veins, as well as the visualized calf veins. Visualized portions of profunda femoral vein and great saphenous vein unremarkable. No filling defects to suggest DVT on grayscale or color Doppler imaging. Doppler waveforms show normal direction of venous flow, normal respiratory plasticity and response to augmentation. Limited views of the contralateral common femoral vein are unremarkable. OTHER Vague 3.5 x 1 cm hypoechoic lesion within the popliteal fossa. Limitations: none IMPRESSION: 1. No deep venous thrombosis of the right lower extremity. 2. Vague 3.5 x 1 cm hypoechoic lesion within the popliteal fossa. Finding may represent a hematoma versus Baker cyst. Electronically Signed   By: Iven Finn M.D.   On: 08/20/2022 16:10    CT Angio Chest PE W and/or Wo Contrast  Result Date: 08/20/2022 CLINICAL DATA:  PE suspected, right-sided chest pain EXAM: CT ANGIOGRAPHY CHEST WITH CONTRAST TECHNIQUE: Multidetector CT imaging of the chest was performed using the standard protocol during bolus administration of intravenous contrast. Multiplanar CT image reconstructions and MIPs were obtained to evaluate the vascular anatomy. RADIATION DOSE REDUCTION: This exam was performed according to the departmental dose-optimization program which includes automated exposure control, adjustment of the mA and/or kV according to patient size and/or use of iterative reconstruction technique. CONTRAST:  135m OMNIPAQUE IOHEXOL 350 MG/ML SOLN COMPARISON:  None Available. FINDINGS: Cardiovascular: Satisfactory opacification of the pulmonary arteries to the segmental level. Positive examination for pulmonary embolus, with subsegmental embolus present in the posterior right upper lobe (series 4, image 48). Cardiomegaly. Scattered left and right coronary artery calcifications. Enlargement of the main pulmonary artery measuring up to 3.6 cm in caliber no pericardial effusion. Scattered aortic atherosclerosis. Mediastinum/Nodes: No enlarged mediastinal, hilar, or axillary lymph nodes. Thyroid gland, trachea, and esophagus demonstrate no significant findings. Lungs/Pleura: Diffuse bilateral bronchial wall thickening. Interlobular septal thickening throughout the lungs. No pleural effusion or pneumothorax. Upper Abdomen: No acute abnormality. Benign, non nodular adenomatous thickening of the bilateral adrenal glands, for which no further follow-up or characterization is required. Musculoskeletal: No chest wall abnormality. No acute osseous findings. Review of the MIP images confirms the above findings. IMPRESSION: 1. Positive examination for pulmonary embolism, with subsegmental embolus present in the posterior right upper lobe. No other evidence of pulmonary  embolism. 2. Interlobular septal thickening and diffuse bilateral bronchial wall thickening, consistent with edema. 3. Cardiomegaly and coronary artery disease. 4. Enlargement of the main pulmonary artery, as can be seen in pulmonary hypertension. These results were called by telephone at the time of interpretation on 08/20/2022 at 3:58 pm to Dr. RMargaretmary Eddy, who verbally acknowledged these results. Aortic  Atherosclerosis (ICD10-I70.0). Electronically Signed   By: Delanna Ahmadi M.D.   On: 08/20/2022 16:06    Procedures Procedures   Medications Ordered in ED Medications  hydrALAZINE (APRESOLINE) tablet 50 mg (50 mg Oral Given 08/21/22 1809)  HYDROmorphone (DILAUDID) injection 0.5 mg (0.5 mg Intravenous Given 08/21/22 1618)  oxyCODONE (Oxy IR/ROXICODONE) immediate release tablet 5 mg (5 mg Oral Given 08/21/22 1430)  acetaminophen (TYLENOL) tablet 650 mg (650 mg Oral Given 08/21/22 1214)  hydrALAZINE (APRESOLINE) injection 5 mg (has no administration in time range)  levothyroxine (SYNTHROID) tablet 125 mcg (125 mcg Oral Given 08/21/22 0731)  insulin aspart (novoLOG) injection 0-9 Units ( Subcutaneous Not Given 08/21/22 1804)  insulin aspart (novoLOG) injection 0-5 Units (has no administration in time range)  amLODipine (NORVASC) tablet 10 mg (10 mg Oral Given 08/21/22 1010)  atorvastatin (LIPITOR) tablet 40 mg (40 mg Oral Given 08/21/22 1009)  cholecalciferol (VITAMIN D3) 25 MCG (1000 UNIT) tablet 5,000 Units (5,000 Units Oral Given 08/21/22 1007)  escitalopram (LEXAPRO) tablet 20 mg (20 mg Oral Given 08/21/22 1009)  ezetimibe (ZETIA) tablet 10 mg (10 mg Oral Given 70/17/79 3903)  folic acid (FOLVITE) tablet 2 mg (2 mg Oral Given 08/21/22 1008)  ipratropium (ATROVENT) 0.06 % nasal spray 1-2 spray (has no administration in time range)  irbesartan (AVAPRO) tablet 300 mg (300 mg Oral Given 08/21/22 1006)  latanoprost (XALATAN) 0.005 % ophthalmic solution 1 drop (has no administration in time  range)  metoprolol succinate (TOPROL-XL) 24 hr tablet 25 mg (25 mg Oral Given 08/21/22 1009)  multivitamin with minerals tablet 1 tablet (1 tablet Oral Given 08/21/22 0092)  sulfaSALAzine (AZULFIDINE) tablet 1,500 mg (1,500 mg Oral Given 08/21/22 1007)  triamcinolone cream (KENALOG) 0.1 % cream 1 Application (has no administration in time range)  cyanocobalamin (VITAMIN B12) tablet 1,000 mcg (1,000 mcg Oral Given 08/21/22 1008)  senna-docusate (Senokot-S) tablet 1 tablet (has no administration in time range)  polyethylene glycol (MIRALAX / GLYCOLAX) packet 17 g (has no administration in time range)  melatonin tablet 5 mg (has no administration in time range)  apixaban (ELIQUIS) tablet 10 mg (10 mg Oral Given 08/21/22 1009)    Followed by  apixaban (ELIQUIS) tablet 5 mg (has no administration in time range)  iohexol (OMNIPAQUE) 350 MG/ML injection 100 mL (100 mLs Intravenous Contrast Given 08/20/22 1544)  heparin bolus via infusion 5,500 Units (5,500 Units Intravenous Bolus from Bag 08/20/22 1633)    ED Course/ Medical Decision Making/ A&P Clinical Course as of 08/21/22 1918  Mon Aug 20, 2022  1548 Signed out to Dr Laverta [place].  [RP]  1603 RUL subsegmental PE noted on CTA.  [RP]    Clinical Course User Index [RP] Fransico Meadow, MD                           Medical Decision Making Amount and/or Complexity of Data Reviewed Labs: ordered. Radiology: ordered.  Risk Prescription drug management. Decision regarding hospitalization.   NKECHI LINEHAN is a 66 y.o. female with comorbidities that complicate the patient evaluation including hypertension, hyperlipidemia, diabetes, and rheumatoid arthritis who presents to the emergency department with chest pain.   This patient presents to the ED for concern of complaints listed in HPI, this involves an extensive number of treatment options, and is a complaint that carries with it a high risk of complications and morbidity. Disposition including  potential need for admission considered.   Initial Ddx:  MI, PE, pneumothorax,  pneumonia, MSK pain, fracture, DVT  MDM:  Concern for possible pulmonary embolism given the patient's pleuritic chest pain that she is having.  Also considered MI but with her most recent stress test feel that this is less likely.  No significant infectious symptoms at this time to suggest pneumonia.  Possible that she could be having MSK pain or fracture from one of her recent falls but states that the pain appears to have started before then.  Also appears to be having swelling in her right lower extremity but unclear if this is from her surgery or possibly a DVT.  Plan:  Labs Troponin BNP EKG CTA chest Lower extremity duplex ultrasound   ED Summary/Re-evaluation:  Patient underwent the above work-up which did show elevated troponin and subsegmental pulmonary embolism.  Surprising that a pulmonary embolism that size would cause an elevated troponin and BNP but may be indicative of a submassive PE.  DVT study did not reveal evidence of DVT.  Patient was started on heparin drip and was admitted to medicine for further management.  Dispo: Admit to Floor   Records reviewed Outpatient Clinic Notes The following labs were independently interpreted: Chemistry and Serial Troponins and show  elevation concerning for R heart strain I personally reviewed and interpreted cardiac monitoring: normal sinus rhythm  I personally reviewed and interpreted the pt's EKG: see above for interpretation  I have reviewed the patients home medications and made adjustments as needed Consults: Hospitalist  Final Clinical Impression(s) / ED Diagnoses Final diagnoses:  Single subsegmental pulmonary embolism without acute cor pulmonale (HCC)  Elevated troponin    Rx / DC Orders ED Discharge Orders          Ordered    APIXABAN (ELIQUIS) VTE STARTER PACK (10MG AND 5MG)  Status:  Discontinued        08/21/22 0852    APIXABAN  (ELIQUIS) VTE STARTER PACK (10MG AND 5MG)       Note to Pharmacy: Meds to bed please   08/21/22 1017    HYDROcodone-acetaminophen (NORCO) 5-325 MG tablet  Every 6 hours PRN        08/21/22 1032           CRITICAL CARE Performed by: Fransico Meadow   Total critical care time: 30 minutes  Critical care time was exclusive of separately billable procedures and treating other patients.  Critical care was necessary to treat or prevent imminent or life-threatening deterioration.  Critical care was time spent personally by me on the following activities: development of treatment plan with patient and/or surrogate as well as nursing, discussions with consultants, evaluation of patient's response to treatment, examination of patient, obtaining history from patient or surrogate, ordering and performing treatments and interventions, ordering and review of laboratory studies, ordering and review of radiographic studies, pulse oximetry and re-evaluation of patient's condition.    Fransico Meadow, MD 08/21/22 [id number]

## 2022-08-20 NOTE — Progress Notes (Signed)
ANTICOAGULATION CONSULT NOTE - Initial Consult  Pharmacy Consult for heparin Indication: pulmonary embolus  Allergies  Allergen Reactions   Ozempic (0.25 Or 0.5 Mg-Dose) [Semaglutide(0.25 Or 0.'5mg'$ -Dos)] Other (See Comments)    Cataracts requiring surgery and impairing vision   Jardiance [Empagliflozin] Itching and Swelling   Latex    Tramadol Hcl Other (See Comments)   Invokana [Canagliflozin] Other (See Comments)    Urinary incontinence   Percocet [Oxycodone-Acetaminophen] Nausea And Vomiting   Prilosec [Omeprazole] Rash   Rogaine [Minoxidil] Rash   Victoza [Liraglutide] Itching    Patient Measurements:   Heparin Dosing Weight: 82kg  Vital Signs: Temp: 98.7 F (37.1 C) (10/23 1231) BP: 167/82 (10/23 1530) Pulse Rate: 67 (10/23 1530)  Labs: Recent Labs    08/20/22 1244  HGB 10.2*  HCT 33.2*  PLT 188  CREATININE 1.00  TROPONINIHS 34*    Estimated Creatinine Clearance: 67.1 mL/min (by C-G formula based on SCr of 1 mg/dL).   Medical History: Past Medical History:  Diagnosis Date   Adrenal adenoma    Anemia    Angio-edema    Asthma    Atrial fibrillation (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

## 2022-08-20 NOTE — ED Notes (Signed)
Pt 88-91% good pleth on room air Placed pt on 2L RT and MD notified

## 2022-08-20 NOTE — ED Notes (Signed)
Patient transported to Ultrasound 

## 2022-08-20 NOTE — Progress Notes (Signed)
[place] for heparin Indication: pulmonary embolus  Allergies  Allergen Reactions   Semaglutide(0.25 Or 0.'5mg'$ -Dos) Other (See Comments)    Cataracts requiring surgery and impairing vision Other reaction(s): Not available   Shellfish-Derived Products Anaphylaxis, Swelling and Other (See Comments)    Patient avoids   Empagliflozin Itching, Swelling and Other (See Comments)    UTI sx's and yeast infections   Oxycodone-Acetaminophen     Other reaction(s): vomiting   Rho (D) Immune Globulin     Other reaction(s): Not available   Semaglutide     Other reaction(s): worsening DM RETINOPATHY   Tramadol Hcl Other (See Comments)    Other reaction(s): hallucinations   Canagliflozin Other (See Comments)    Urinary incontinence Other reaction(s): Not available, urinary incontinence   Latex Rash   Liraglutide Itching    Other reaction(s): itching all over   Minoxidil Rash    Other reaction(s): skin irritation   Omeprazole Rash    Other reaction(s): Not available   Percocet [Oxycodone-Acetaminophen] Nausea And Vomiting    Patient Measurements:   Heparin Dosing Weight: 82kg  Vital Signs: Temp: 98.8 F (37.1 C) (10/23 2227) Temp Source: Oral (10/23 2227) BP: 136/58 (10/23 2227) Pulse Rate: 67 (10/23 2227)  Labs: Recent Labs    08/20/22 1244 08/20/22 1710 08/20/22 2258  HGB 10.2*  --   --   HCT 33.2*  --   --   PLT 188  --   --   HEPARINUNFRC  --   --  0.91*  CREATININE 1.00  --   --   TROPONINIHS 34* 45*  --      Estimated Creatinine Clearance: 67.1 mL/min (by C-G formula based on SCr of 1 mg/dL).   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

## 2022-08-21 ENCOUNTER — Observation Stay (HOSPITAL_BASED_OUTPATIENT_CLINIC_OR_DEPARTMENT_OTHER): Payer: Medicare PPO

## 2022-08-21 ENCOUNTER — Other Ambulatory Visit (HOSPITAL_COMMUNITY): Payer: Self-pay

## 2022-08-21 ENCOUNTER — Encounter: Payer: Self-pay | Admitting: Hematology and Oncology

## 2022-08-21 ENCOUNTER — Telehealth (HOSPITAL_COMMUNITY): Payer: Self-pay | Admitting: Pharmacy Technician

## 2022-08-21 DIAGNOSIS — I2609 Other pulmonary embolism with acute cor pulmonale: Secondary | ICD-10-CM | POA: Diagnosis not present

## 2022-08-21 DIAGNOSIS — I2699 Other pulmonary embolism without acute cor pulmonale: Secondary | ICD-10-CM | POA: Diagnosis not present

## 2022-08-21 LAB — CBC
HCT: 34.3 % — ABNORMAL LOW (ref 36.0–46.0)
Hemoglobin: 10.4 g/dL — ABNORMAL LOW (ref 12.0–15.0)
MCH: 28.7 pg (ref 26.0–34.0)
MCHC: 30.3 g/dL (ref 30.0–36.0)
MCV: 94.5 fL (ref 80.0–100.0)
Platelets: 185 10*3/uL (ref 150–400)
RBC: 3.63 MIL/uL — ABNORMAL LOW (ref 3.87–5.11)
RDW: 14.7 % (ref 11.5–15.5)
WBC: 4.5 10*3/uL (ref 4.0–10.5)
nRBC: 0 % (ref 0.0–0.2)

## 2022-08-21 LAB — COMPREHENSIVE METABOLIC PANEL
ALT: 37 U/L (ref 0–44)
AST: 29 U/L (ref 15–41)
Albumin: 3.3 g/dL — ABNORMAL LOW (ref 3.5–5.0)
Alkaline Phosphatase: 56 U/L (ref 38–126)
Anion gap: 5 (ref 5–15)
BUN: 19 mg/dL (ref 8–23)
CO2: 25 mmol/L (ref 22–32)
Calcium: 8.5 mg/dL — ABNORMAL LOW (ref 8.9–10.3)
Chloride: 107 mmol/L (ref 98–111)
Creatinine, Ser: 1.08 mg/dL — ABNORMAL HIGH (ref 0.44–1.00)
GFR, Estimated: 57 mL/min — ABNORMAL LOW (ref >60)
Glucose, Bld: 85 mg/dL (ref 70–99)
Potassium: 4.1 mmol/L (ref 3.5–5.1)
Sodium: 137 mmol/L (ref 135–145)
Total Bilirubin: 0.6 mg/dL (ref 0.3–1.2)
Total Protein: 5.3 g/dL — ABNORMAL LOW (ref 6.5–8.1)

## 2022-08-21 LAB — GLUCOSE, CAPILLARY
Glucose-Capillary: 83 mg/dL (ref 70–99)
Glucose-Capillary: 121 mg/dL — ABNORMAL HIGH (ref 70–99)
Glucose-Capillary: 97 mg/dL (ref 70–99)
Glucose-Capillary: 155 mg/dL — ABNORMAL HIGH (ref 70–99)

## 2022-08-21 LAB — HEMOGLOBIN A1C
Hgb A1c MFr Bld: 5.6 % (ref 4.8–5.6)
Mean Plasma Glucose: 114.02 mg/dL

## 2022-08-21 LAB — ECHOCARDIOGRAM COMPLETE
Area-P 1/2: 4.6 cm2
Calc EF: 49.1 %
S' Lateral: 4.2 cm
Single Plane A2C EF: 50 %
Single Plane A4C EF: 48.5 %

## 2022-08-21 LAB — PHOSPHORUS: Phosphorus: 4.7 mg/dL — ABNORMAL HIGH (ref 2.5–4.6)

## 2022-08-21 LAB — HIV ANTIBODY (ROUTINE TESTING W REFLEX): HIV Screen 4th Generation wRfx: NONREACTIVE

## 2022-08-21 LAB — MAGNESIUM: Magnesium: 1.9 mg/dL (ref 1.7–2.4)

## 2022-08-21 MED ORDER — INSULIN ASPART 100 UNIT/ML IJ SOLN
0.0000 [IU] | Freq: Three times a day (TID) | INTRAMUSCULAR | Status: DC
  Administered 2022-08-21: 1 [IU] via SUBCUTANEOUS

## 2022-08-21 MED ORDER — MELATONIN 5 MG PO TABS
5.0000 mg | ORAL_TABLET | Freq: Every evening | ORAL | Status: DC | PRN
  Filled 2022-08-21: qty 1

## 2022-08-21 MED ORDER — FOLIC ACID 1 MG PO TABS
2.0000 mg | ORAL_TABLET | Freq: Every day | ORAL | Status: DC
  Administered 2022-08-21 – 2022-08-22 (×2): 2 mg via ORAL
  Filled 2022-08-21 (×2): qty 2

## 2022-08-21 MED ORDER — IPRATROPIUM BROMIDE 0.06 % NA SOLN: 1.0000 | Freq: Three times a day (TID) | NASAL | Status: DC | PRN

## 2022-08-21 MED ORDER — APIXABAN 5 MG PO TABS
10.0000 mg | ORAL_TABLET | Freq: Two times a day (BID) | ORAL | Status: DC
  Administered 2022-08-21 – 2022-08-22 (×3): 10 mg via ORAL
  Filled 2022-08-21 (×3): qty 2

## 2022-08-21 MED ORDER — VITAMIN D 25 MCG (1000 UNIT) PO TABS
5000.0000 [IU] | ORAL_TABLET | Freq: Every day | ORAL | Status: DC
  Administered 2022-08-21 – 2022-08-22 (×2): 5000 [IU] via ORAL
  Filled 2022-08-21 (×2): qty 5

## 2022-08-21 MED ORDER — HYDROCODONE-ACETAMINOPHEN 5-325 MG PO TABS
1.0000 | ORAL_TABLET | Freq: Four times a day (QID) | ORAL | 0 refills | Status: DC | PRN
  Filled 2022-08-21: qty 15, 4d supply, fill #0

## 2022-08-21 MED ORDER — APIXABAN (ELIQUIS) VTE STARTER PACK (10MG AND 5MG): ORAL_TABLET | ORAL | 0 refills | Status: DC

## 2022-08-21 MED ORDER — APIXABAN 5 MG PO TABS: 5.0000 mg | ORAL_TABLET | Freq: Two times a day (BID) | ORAL | Status: DC

## 2022-08-21 MED ORDER — EZETIMIBE 10 MG PO TABS
10.0000 mg | ORAL_TABLET | Freq: Every day | ORAL | Status: DC
  Administered 2022-08-21 – 2022-08-22 (×2): 10 mg via ORAL
  Filled 2022-08-21 (×2): qty 1

## 2022-08-21 MED ORDER — ESCITALOPRAM OXALATE 20 MG PO TABS
20.0000 mg | ORAL_TABLET | Freq: Every day | ORAL | Status: DC
  Administered 2022-08-21 – 2022-08-22 (×2): 20 mg via ORAL
  Filled 2022-08-21 (×2): qty 1

## 2022-08-21 MED ORDER — ADULT MULTIVITAMIN W/MINERALS CH
1.0000 | ORAL_TABLET | Freq: Every day | ORAL | Status: DC
  Administered 2022-08-21 – 2022-08-22 (×2): 1 via ORAL
  Filled 2022-08-21 (×2): qty 1

## 2022-08-21 MED ORDER — IRBESARTAN 300 MG PO TABS
300.0000 mg | ORAL_TABLET | Freq: Every day | ORAL | Status: DC
  Administered 2022-08-21 – 2022-08-22 (×2): 300 mg via ORAL
  Filled 2022-08-21 (×2): qty 1

## 2022-08-21 MED ORDER — SENNOSIDES-DOCUSATE SODIUM 8.6-50 MG PO TABS
1.0000 | ORAL_TABLET | Freq: Every day | ORAL | Status: DC
  Filled 2022-08-21: qty 1

## 2022-08-21 MED ORDER — TRIAMCINOLONE ACETONIDE 0.1 % EX CREA: 1.0000 | TOPICAL_CREAM | Freq: Three times a day (TID) | CUTANEOUS | Status: DC | PRN

## 2022-08-21 MED ORDER — VITAMIN B-12 1000 MCG PO TABS
1000.0000 ug | ORAL_TABLET | Freq: Every day | ORAL | Status: DC
  Administered 2022-08-21 – 2022-08-22 (×2): 1000 ug via ORAL
  Filled 2022-08-21 (×2): qty 1

## 2022-08-21 MED ORDER — POLYETHYLENE GLYCOL 3350 17 G PO PACK: 17.0000 g | PACK | Freq: Every day | ORAL | Status: DC | PRN

## 2022-08-21 MED ORDER — SULFASALAZINE 500 MG PO TABS
1500.0000 mg | ORAL_TABLET | Freq: Two times a day (BID) | ORAL | Status: DC
  Administered 2022-08-21 – 2022-08-22 (×3): 1500 mg via ORAL
  Filled 2022-08-21 (×3): qty 3

## 2022-08-21 MED ORDER — APIXABAN (ELIQUIS) VTE STARTER PACK (10MG AND 5MG)
ORAL_TABLET | ORAL | 0 refills | Status: DC
  Filled 2022-08-21: qty 74, 30d supply, fill #0

## 2022-08-21 MED ORDER — METOPROLOL SUCCINATE ER 25 MG PO TB24
25.0000 mg | ORAL_TABLET | Freq: Every day | ORAL | Status: DC
  Administered 2022-08-21 – 2022-08-22 (×2): 25 mg via ORAL
  Filled 2022-08-21 (×2): qty 1

## 2022-08-21 MED ORDER — LATANOPROST 0.005 % OP SOLN
1.0000 [drp] | Freq: Every day | OPHTHALMIC | Status: DC
  Administered 2022-08-21: 1 [drp] via OPHTHALMIC
  Filled 2022-08-21 (×2): qty 2.5

## 2022-08-21 MED ORDER — INSULIN ASPART 100 UNIT/ML IJ SOLN: 0.0000 [IU] | Freq: Every day | INTRAMUSCULAR | Status: DC

## 2022-08-21 MED ORDER — ATORVASTATIN CALCIUM 40 MG PO TABS
40.0000 mg | ORAL_TABLET | Freq: Every day | ORAL | Status: DC
  Administered 2022-08-21 – 2022-08-22 (×2): 40 mg via ORAL
  Filled 2022-08-21 (×3): qty 1

## 2022-08-21 MED ORDER — AMLODIPINE BESYLATE 10 MG PO TABS
10.0000 mg | ORAL_TABLET | Freq: Every day | ORAL | Status: DC
  Administered 2022-08-21 – 2022-08-22 (×2): 10 mg via ORAL
  Filled 2022-08-21 (×2): qty 1

## 2022-08-21 NOTE — TOC Benefit Eligibility Note (Signed)
Patient Teacher, English as a foreign language completed.    The patient is currently admitted and upon discharge could be taking Eliquis 5 mg.  The current 30 day co-pay is $40.00.   The patient is insured through [place], [place] Patient Advocate Specialist [place] Patient Advocate Team Direct Number: [phone]  Fax: [phone]

## 2022-08-21 NOTE — Evaluation (Signed)
Physical Therapy Evaluation Patient Details Name: Jocelyn Kaufman MRN: [id number] DOB: [birth year] Today's Date: 08/21/2022  History of Present Illness  Patient is a 66 year old female who presented to the hosptial with chest pain and mild shortness of breath and reports of fall on 10/17. CT revealed subsegmental embolus in posterior R upper lobe. patient was admitted with elevated troponin.   PMH: DM II, GERD, RA, HTN, hyperlipidemia, quad tendon repair surgery, R humerus fratuure with ORIF in 2019  Clinical Impression  Pt supine in bed upon entering.  Was a bit challenging to gather all PLOF and history for session. Pt reports lives alone and he nephew and sister who could help her occasionally have had recent issues ( medical) and may not be available to help. Pt had difficulty with bed mobility and sit t stand requiring assistance to move at this time due to pain in R anterior shoulder region and R LE, along with weakness BLE ( R greater than L) aas well. Pt reports 5 falls in recent past and fearful of continuing to fall. Pt has been seeing OPPT, and I recommended switch to HHPT until stronger , however she declined and wants to get a ride to continue  with OPPT.  I have concerns on how she is going to manage at home and encouraged she find family or friends to assist over the next few days. Hopefully she will imporv over the time she is here in acute care, we will continue to follow and see her tomorrow as well.      Recommendations for follow up therapy are one component of a multi-disciplinary discharge planning process, led by the attending physician.  Recommendations may be updated based on patient status, additional functional criteria and insurance authorization.  Follow Up Recommendations Home health PT (recommended HHPT but prefers to continue with OPPT)      Assistance Recommended at Discharge Intermittent Supervision/Assistance  Patient can return home with the following  A little help  with walking and/or transfers;A little help with bathing/dressing/bathroom;Help with stairs or ramp for entrance    Equipment Recommendations None recommended by PT  Recommendations for Other Services       Functional Status Assessment Patient has had a recent decline in their functional status and demonstrates the ability to make significant improvements in function in a reasonable and predictable amount of time.     Precautions / Restrictions Precautions Precautions: Fall Precaution Comments: pt reports she has had 5 falls recently Restrictions Weight Bearing Restrictions: No      Mobility  Bed Mobility Overal bed mobility: Needs Assistance Bed Mobility: Supine to Sit, Sit to Supine     Supine to sit: Mod assist Sit to supine: Min guard   General bed mobility comments: supien to sit had to assist pt with RLE due to pain and unablity to move RLE and pain in R sholder with roll and supine to sit movment pattern . Sit to supine , pt wanted to trial herself and utlized her BLE to assist her RLE up on bed, and R shoulder anterior area was very painful with this manuever    Transfers Overall transfer level: Needs assistance Equipment used: Rolling walker (2 wheels) Transfers: Sit to/from Stand Sit to Stand: Mod assist           General transfer comment: pt attempted sit to stand 3 times , then required assit to boost to stand ( from Left side since RUE hurt to help assit the patient)  Ambulation/Gait Ambulation/Gait assistance: Min guard Gait Distance (Feet): 30 Feet (in room only 2 laps) Assistive device: Rolling walker (2 wheels) Gait Pattern/deviations: Step-through pattern       General Gait Details: very guarded, fearful of falling, had episodes of dizziness and pauses wiht the lap in the room  Stairs            Wheelchair Mobility    Modified Rankin (Stroke Patients Only)       Balance Overall balance assessment: Needs  assistance Sitting-balance support: Feet supported, No upper extremity supported Sitting balance-Leahy Scale: Good     Standing balance support: During functional activity, Bilateral upper extremity supported Standing balance-Leahy Scale: Fair                               Pertinent Vitals/Pain Pain Assessment Pain Score: 6  Pain Location: R anterior shoulder and upper rib area with any movment of R LUE in bed or raising to utilize R UE . R LE very painful as well in quad and knee area ( very general with this discription) Pain Descriptors / Indicators: Discomfort, Grimacing, Moaning, Aching, Sharp Pain Intervention(s): Limited activity within patient's tolerance, Monitored during session, Premedicated before session, Repositioned    Home Living Family/patient expects to be discharged to:: Private residence Living Arrangements: Alone Available Help at Discharge: Family;Available PRN/intermittently (reports nephew and sister had recent ( past few days) medical issues and may not be available.) Type of Home: House Home Access: Stairs to enter Entrance Stairs-Rails: Right Entrance Stairs-Number of Steps: 2   Home Layout: One level Home Equipment: Conservation officer, nature (2 wheels);Cane - quad;Toilet riser      Prior Function Prior Level of Function : Independent/Modified Independent             Mobility Comments: pt stated she has been going to OPPT and recently got off the RW and onto a single cane, however , over past week had to start using 2 canes to help stabilize herself ADLs Comments: has cat she feeds outdoors, driving to get groceries     Hand Dominance        Extremity/Trunk Assessment        Lower Extremity Assessment Lower Extremity Assessment: RLE deficits/detail RLE Deficits / Details: supine in bed pt unable to move R LE ( states this is usual) has to use UEs to move R LE. 2/5 for hip flexion and knee extension on this side. WNL ROM for DF/PF. R LE  with numbness lateral thigh area and pt states it has been this way for a while and she has received treatment at [facility] for this. ( very vague historian on these findings). Large Bruise noted on internal thigh area starting at knee area RLE Sensation: decreased light touch (R lateral thigh ( see note above))    Cervical / Trunk Assessment Cervical / Trunk Assessment: Normal  Communication   Communication: No difficulties (little difficulty and frustration with word find today, but not grossly abnormal, pt stated she was tired.)  Cognition Arousal/Alertness: Awake/alert Behavior During Therapy: WFL for tasks assessed/performed Overall Cognitive Status: Within Functional Limits for tasks assessed                                 General Comments: Pt very flat and slight struggle for word find, fearful aslo of falling again as well.  General Comments      Exercises     Assessment/Plan    PT Assessment Patient needs continued PT services  PT Problem List Decreased strength;Decreased range of motion;Decreased activity tolerance;Decreased mobility       PT Treatment Interventions Gait training;Functional mobility training;Therapeutic activities;Patient/family education;Therapeutic exercise    PT Goals (Current goals can be found in the Care Plan section)  Acute Rehab PT Goals Patient Stated Goal: I want to continue to be independent but have a fear of falling again PT Goal Formulation: With patient Time For Goal Achievement: 09/04/22 Potential to Achieve Goals: Good    Frequency Min 3X/week     Co-evaluation               AM-PAC PT "6 Clicks" Mobility  Outcome Measure Help needed turning from your back to your side while in a flat bed without using bedrails?: None Help needed moving from lying on your back to sitting on the side of a flat bed without using bedrails?: A Little Help needed moving to and from a bed to a chair (including a wheelchair)?: A  Lot Help needed standing up from a chair using your arms (e.g., wheelchair or bedside chair)?: A Lot Help needed to walk in hospital room?: A Little Help needed climbing 3-5 steps with a railing? : A Little 6 Click Score: 17    End of Session Equipment Utilized During Treatment: Gait belt Activity Tolerance: Patient limited by fatigue;Patient limited by pain Patient left: in bed;with call bell/phone within reach;with bed alarm set Nurse Communication: Mobility status PT Visit Diagnosis: Other abnormalities of gait and mobility (R26.89);History of falling (Z91.81)    Time: 5056-9794 PT Time Calculation (min) (ACUTE ONLY): 45 min   Charges:   PT Evaluation $PT Eval Low Complexity: 1 Low PT Treatments $Gait Training: 8-22 mins        Gatha Mayer, PT, MPT Acute Rehabilitation Services Office: [phone] If a weekend: [facility] w/e pager [phone] 08/21/2022   Clide Dales 08/21/2022, 7:13 PM

## 2022-08-21 NOTE — Progress Notes (Signed)
Jocelyn Kaufman attempted to complete spiritual care visit but patient was resting peacefully in room. Prayed silently for patient. Informed RN that someone from pastoral care department will visit tomorrow.   Rev. Marilynn Rail, M.Div.  Healthcare Chaplain

## 2022-08-21 NOTE — Progress Notes (Signed)
Echocardiogram 2D Echocardiogram has been performed.  Jocelyn Kaufman 08/21/2022, 9:34 AM

## 2022-08-21 NOTE — Evaluation (Signed)
Occupational Therapy Evaluation Patient Details Name: Jocelyn Kaufman MRN: [id number] DOB: 1956/08/07 Today's Date: 08/21/2022   History of Present Illness Patient is a 66 year old female who presented to the hosptial with chest pain and mild shortness of breath and reports of fall on 10/17. CT revealed subsegmental embolus in posterior R upper lobe. patient was admitted with elevated troponin.   PMH: DM II, GERD, RA, HTN, hyperlipidemia, quad tendon repair surgery   Clinical Impression   Patient is a 66 year old female who was admitted for above. Patient lives at home alone at cane level PLOF. Currently, patient is min guard for functional mobility at RW level in room with min A for sit to stand from recliner with increased pain in BLE with movement. Patient continues to have 8/10 pain in R side of chest with nurse made aware. Patient was educated on AE for LB Dressing/bathing tasks. Patient reported she would look into getting total hip kit. Patient would benefit from caregiver support in next level of care for IADL tasks. Patient was noted to have decreased functional activity tolerance, decreased endurance, decreased standing balance,increased pain, decreased safety awareness, and decreased knowledge of AD/AE impacting participation in ADLs. Patient would continue to benefit from skilled OT services at this time while admitted and after d/c to address noted deficits in order to improve overall safety and independence in ADLs.        Recommendations for follow up therapy are one component of a multi-disciplinary discharge planning process, led by the attending physician.  Recommendations may be updated based on patient status, additional functional criteria and insurance authorization.   Follow Up Recommendations  Home health OT    Assistance Recommended at Discharge Frequent or constant Supervision/Assistance  Patient can return home with the following A little help with walking and/or  transfers;Assistance with cooking/housework;Help with stairs or ramp for entrance;Assist for transportation    Functional Status Assessment  Patient has had a recent decline in their functional status and demonstrates the ability to make significant improvements in function in a reasonable and predictable amount of time.  Equipment Recommendations  Other (comment) (total hip kit)    Recommendations for Other Services       Precautions / Restrictions Precautions Precautions: Fall Restrictions Weight Bearing Restrictions: No      Mobility Bed Mobility               General bed mobility comments: patient was up in recliner and returned to the same at end of session.    Transfers                          Balance Overall balance assessment: Needs assistance Sitting-balance support: Feet supported, No upper extremity supported Sitting balance-Leahy Scale: Good     Standing balance support: During functional activity Standing balance-Leahy Scale: Fair Standing balance comment: standing at sink with no BUE support.                           ADL either performed or assessed with clinical judgement   ADL Overall ADL's : Needs assistance/impaired Eating/Feeding: Modified independent;Sitting   Grooming: Wash/dry face;Oral care;Set up;Standing;Supervision/safety Grooming Details (indicate cue type and reason): at sink with RW Upper Body Bathing: Set up;Sitting   Lower Body Bathing: Moderate assistance;Sit to/from stand;Sitting/lateral leans Lower Body Bathing Details (indicate cue type and reason): patient unable to bring BLE to lap with current  pain in bilateral knees. Upper Body Dressing : Set up;Sitting   Lower Body Dressing: Sit to/from stand;Sitting/lateral leans;Moderate assistance Lower Body Dressing Details (indicate cue type and reason): patient was educated on using total hip kit for LB dressing. patient reported she would look into getting  one at home. patient continued to have pain with all movements in bialteral knees. Toilet Transfer: Min guard;Ambulation;Rolling walker (2 wheels) Toilet Transfer Details (indicate cue type and reason): with increased time. patient noted to call out in pain with standing with multiple attempts to transition out of recliner with min guard. patient noted to have crepitus noise in what patietn reported L knee with movement Toileting- Clothing Manipulation and Hygiene: Min guard;Sit to/from stand               Vision Baseline Vision/History: 1 Wears glasses Patient Visual Report: No change from baseline       Perception     Praxis      Pertinent Vitals/Pain Pain Assessment Pain Assessment: 0-10 Pain Score: 8  Pain Location: R side of chest with all UE movement Pain Descriptors / Indicators: Discomfort, Grimacing, Moaning Pain Intervention(s): Monitored during session, Limited activity within patient's tolerance, Patient requesting pain meds-RN notified     Hand Dominance Right   Extremity/Trunk Assessment Upper Extremity Assessment Upper Extremity Assessment:  (did not formally assess ROM or MMT with current pain levels appeared WNL with patient using BUE to stand and RW)   Lower Extremity Assessment Lower Extremity Assessment: Defer to PT evaluation   Cervical / Trunk Assessment Cervical / Trunk Assessment: Normal   Communication     Cognition Arousal/Alertness: Awake/alert Behavior During Therapy: WFL for tasks assessed/performed Overall Cognitive Status: Within Functional Limits for tasks assessed                                 General Comments: patient was noted to become tearful with idea of using walker. patient was educated that her thoughts and feelings were valid. patient was educated that sometimes there are changes were we might need a little more support but it does not have to be a perminent thing.  chaplin services was paged to visit with  patient at conclusion of this session.     General Comments  noted to have large bruise on medial thigh anterior to knee. doppler for RLE was negative.    Exercises     Shoulder Instructions      Home Living Family/patient expects to be discharged to:: Private residence Living Arrangements: Alone Available Help at Discharge: Family;Available PRN/intermittently Type of Home: House Home Access: Stairs to enter [employer] of Steps: 2 Entrance Stairs-Rails: Right Home Layout: One level               Home Equipment: Cane - quad          Prior Functioning/Environment Prior Level of Function : Independent/Modified Independent               ADLs Comments: has cat she feeds outdoors, driving to get groceries        OT Problem List: Decreased activity tolerance;Impaired UE functional use;Decreased knowledge of precautions;Impaired balance (sitting and/or standing);Decreased safety awareness;Pain      OT Treatment/Interventions: Self-care/ADL training;Therapeutic exercise;Neuromuscular education;DME and/or AE instruction;Energy conservation;Therapeutic activities;Balance training;Patient/family education    OT Goals(Current goals can be found in the care plan section) Acute Rehab OT Goals Patient Stated Goal: to go  home OT Goal Formulation: With patient Time For Goal Achievement: 09/04/22 Potential to Achieve Goals: Fair  OT Frequency: Min 2X/week    Co-evaluation              AM-PAC OT "6 Clicks" Daily Activity     Outcome Measure Help from another person eating meals?: A Little Help from another person taking care of personal grooming?: A Little Help from another person toileting, which includes using toliet, bedpan, or urinal?: A Little Help from another person bathing (including washing, rinsing, drying)?: A Lot Help from another person to put on and taking off regular upper body clothing?: A Little Help from another person to put on and  taking off regular lower body clothing?: A Little (with AE) 6 Click Score: 17   End of Session Equipment Utilized During Treatment: Gait belt;Rolling walker (2 wheels);Other (comment) (total hip kit) Nurse Communication: Patient requests pain meds;Other (comment) (chaplin services being paged.)  Activity Tolerance: Patient tolerated treatment well Patient left: in chair;with call bell/phone within reach  OT Visit Diagnosis: Unsteadiness on feet (R26.81);Other abnormalities of gait and mobility (R26.89);Muscle weakness (generalized) (M62.81);Pain Pain - Right/Left: Right Pain - part of body: Knee                Time: 1337-1410 OT Time Calculation (min): 33 min Charges:  OT General Charges $OT Visit: 1 Visit OT Evaluation $OT Eval Moderate Complexity: 1 Mod OT Treatments $Self Care/Home Management : 8-22 mins  Rennie Plowman, MS Acute Rehabilitation Department Office# [phone]   Marcellina Millin 08/21/2022, 2:59 PM

## 2022-08-21 NOTE — Progress Notes (Signed)
Patient seen and examined.  Admitted early morning hours by nighttime hospitalist.  She was found to have pleuritic chest pain and a small subsegmental pulmonary embolism.  Duplexes were negative for lower extremity blood clots.  Patient has multiple medical issues and also takes baby aspirin for history of TIA.  Patient had quadriceps tendon repair about 2 months ago and she was on knee brace with decreased mobility for about 6 weeks.  This is subsegmental pulmonary embolism but she was symptomatic so we will treat with Eliquis for 3 months.  Plan: Stop heparin.  Start Eliquis.  Work with PT OT to ensure safe disposition. Patient will be monitored with mobility, recurrence of pain and hypoxemia.  If remains fairly comfortable and can mobilize around anticipate discharge home later tonight or tomorrow morning.   Same-day admit.  No charge visit.

## 2022-08-21 NOTE — Discharge Instructions (Addendum)
Information on my medicine - ELIQUIS (apixaban)  This medication education was reviewed with me or my healthcare representative as part of my discharge preparation.  The pharmacist that spoke with me during my hospital stay was:  Earvin Hansen, Student-PharmD  Why was Eliquis prescribed for you? Eliquis was prescribed to treat blood clots that may have been found in the veins of your legs (deep vein thrombosis) or in your lungs (pulmonary embolism) and to reduce the risk of them occurring again.  What do You need to know about Eliquis ? The starting dose is 10 mg (two 5 mg tablets) taken TWICE daily for the FIRST SEVEN (7) DAYS, then on 08/28/22  the dose is reduced to ONE 5 mg tablet taken TWICE daily.  Eliquis may be taken with or without food.   Try to take the dose about the same time in the morning and in the evening. If you have difficulty swallowing the tablet whole please discuss with your pharmacist how to take the medication safely.  Take Eliquis exactly as prescribed and DO NOT stop taking Eliquis without talking to the doctor who prescribed the medication.  Stopping may increase your risk of developing a new blood clot.  Refill your prescription before you run out.  After discharge, you should have regular check-up appointments with your healthcare provider that is prescribing your Eliquis.    What do you do if you miss a dose? If a dose of ELIQUIS is not taken at the scheduled time, take it as soon as possible on the same day and twice-daily administration should be resumed. The dose should not be doubled to make up for a missed dose.  Important Safety Information A possible side effect of Eliquis is bleeding. You should call your healthcare provider right away if you experience any of the following: Bleeding from an injury or your nose that does not stop. Unusual colored urine (red or dark brown) or unusual colored stools (red or black). Unusual bruising for unknown  reasons. A serious fall or if you hit your head (even if there is no bleeding).  Some medicines may interact with Eliquis and might increase your risk of bleeding or clotting while on Eliquis. To help avoid this, consult your healthcare provider or pharmacist prior to using any new prescription or non-prescription medications, including herbals, vitamins, non-steroidal anti-inflammatory drugs (NSAIDs) and supplements.  This website has more information on Eliquis (apixaban): http://[email or web address].com/eliquis/home

## 2022-08-21 NOTE — Progress Notes (Signed)
[place] for heparin>> apixaban Indication: pulmonary embolus  Allergies  Allergen Reactions   Semaglutide(0.25 Or 0.'5mg'$ -Dos) Other (See Comments)    Cataracts requiring surgery and impairing vision Other reaction(s): Not available   Shellfish-Derived Products Anaphylaxis, Swelling and Other (See Comments)    Patient avoids   Empagliflozin Itching, Swelling and Other (See Comments)    UTI sx's and yeast infections   Oxycodone-Acetaminophen     Other reaction(s): vomiting   Rho (D) Immune Globulin     Other reaction(s): Not available   Semaglutide     Other reaction(s): worsening DM RETINOPATHY   Tramadol Hcl Other (See Comments)    Other reaction(s): hallucinations   Canagliflozin Other (See Comments)    Urinary incontinence Other reaction(s): Not available, urinary incontinence   Latex Rash   Liraglutide Itching    Other reaction(s): itching all over   Minoxidil Rash    Other reaction(s): skin irritation   Omeprazole Rash    Other reaction(s): Not available   Percocet [Oxycodone-Acetaminophen] Nausea And Vomiting    Patient Measurements:   Heparin Dosing Weight: 82kg  Vital Signs: Temp: 97.9 F (36.6 C) (10/24 0626) Temp Source: Oral (10/24 0626) BP: 134/73 (10/24 0626) Pulse Rate: 65 (10/24 0626)  Labs: Recent Labs    08/20/22 1244 08/20/22 1710 08/20/22 2258  HGB 10.2*  --   --   HCT 33.2*  --   --   PLT 188  --   --   HEPARINUNFRC  --   --  0.91*  CREATININE 1.00  --   --   TROPONINIHS 34* 45*  --      Estimated Creatinine Clearance: 67.1 mL/min (by C-G formula based on SCr of 1 mg/dL).   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[place])    Depression    Diabetes mellitus without complication (HCC)    Fibromyalgia    Hyperlipidemia    Hypertension    Hypothyroidism    Meralgia  paresthetica of right side 01/10/2017   Migraine    Pericardial effusion    Proteinuria    Rheumatoid arthritis (HCC)    Ulnar neuropathy at wrist, 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   Urticaria     Assessment: 76 YOF presenting with CP and SOB, CT angio chest shows PE, she is not on anticoagulation PTA, chronic anemia stable, plts 188  08/21/2022: To transition from heparin to apixaban Goal of Therapy:  Heparin level 0.3-0.7 units/ml Monitor platelets by anticoagulation protocol: Yes   Plan:  DC heparin drip Apixaban 10 mg po bid x 7 days followed by apixaban 5 mg po bid Will educate & give 30 day free card today  Eudelia Bunch, Pharm.D 08/21/2022 9:14 AM

## 2022-08-21 NOTE — TOC Initial Note (Signed)
Transition of Care [facility]) - Initial/Assessment Note    Patient Details  Name: Jocelyn Kaufman MRN: [id number] Date of Birth: [birth year]  Transition of Care [facility]) CM/SW Contact:    Leeroy Cha, RN Phone Number: 08/21/2022, 8:30 AM  Clinical Narrative:                   Transition of Care [facility]) Screening Note   Patient Details  Name: Jocelyn Kaufman Date of Birth: 08-29-1956   Transition of Care [facility]) CM/SW Contact:    Leeroy Cha, RN Phone Number: 08/21/2022, 8:30 AM    Transition of Care Department [facility]) has reviewed patient and no TOC needs have been identified at this time. We will continue to monitor patient advancement through interdisciplinary progression rounds. If new patient transition needs arise, please place a TOC consult.   Expected Discharge Plan: Home/Self Care Barriers to Discharge: Continued Medical Work up   Patient Goals and CMS Choice Patient states their goals for this hospitalization and ongoing recovery are:: to return to my home CMS Medicare.gov Compare Post Acute Care list provided to:: Patient    Expected Discharge Plan and Services Expected Discharge Plan: Home/Self Care   Discharge Planning Services: CM Consult   Living arrangements for the past 2 months: Single Family Home                                      Prior Living Arrangements/Services Living arrangements for the past 2 months: Single Family Home Lives with:: Self Patient language and need for interpreter reviewed:: Yes Do you feel safe going back to the place where you live?: Yes            Criminal Activity/Legal Involvement Pertinent to Current Situation/Hospitalization: No - Comment as needed  Activities of Daily Living      Permission Sought/Granted                  Emotional Assessment Appearance:: Appears stated age Attitude/Demeanor/Rapport: Engaged Affect (typically observed): Calm Orientation: : Oriented to Self, Oriented to Place,  Oriented to  Time, Oriented to Situation Alcohol / Substance Use: Never Used Psych Involvement: No (comment)  Admission diagnosis:  Pulmonary embolus ([place]) [I26.99] Elevated troponin [R79.89] Single subsegmental pulmonary embolism without acute cor pulmonale (HCC) [I26.93] Patient Active Problem List   Diagnosis Date Noted   Pulmonary embolus ([place]) 08/20/2022   Abnormal reflex 02/20/2022   Anxiety 02/20/2022   Body mass index (BMI) 40.0-44.9, adult ([place]) 02/20/2022   Breast tenderness 02/20/2022   Carotid artery occlusion 02/20/2022   Cataract 02/20/2022   Chronic kidney disease, stage 3a ([place]) 02/20/2022   Diabetic renal disease ([place]) 02/20/2022   Diabetic retinopathy associated with type 2 diabetes mellitus ([place]) 02/20/2022   Encounter for general adult medical examination without abnormal findings 02/20/2022   Family history of malignant neoplasm of digestive organs 02/20/2022   Fibromyalgia 02/20/2022   Generalized anxiety disorder 02/20/2022   History of musculoskeletal disease 02/20/2022   Hypothyroid 02/20/2022   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[place]) 02/20/2022   Morbid obesity ([place]) 02/20/2022   Mild intermittent asthma 02/20/2022   Myalgia 02/20/2022   Neuropathy of both feet 02/20/2022   Non-toxic nodular goiter 02/20/2022   Osteoarthritis of knee 02/20/2022   Obstructive sleep apnea syndrome 02/20/2022   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[place]) 02/20/2022   Paroxysmal ventricular tachycardia ([place]) 02/20/2022   Postmenopausal state 02/20/2022  Personal history of transient ischemic attack (TIA), 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 deficits 02/20/2022   Recurrent major depression in full remission ([place]) 02/20/2022   Renal failure syndrome 02/20/2022   Sciatica 02/20/2022   Vitamin D deficiency 02/20/2022   Bilateral hearing loss 08/29/2020   Dysequilibrium 08/29/2020   Anemia in chronic kidney disease 06/17/2020   Anemia of chronic disease 04/25/2020    Impingement syndrome of right shoulder region 11/25/2018   Osteoarthritis of right shoulder 11/03/2018   Pain in joint of right shoulder 11/03/2018   Fall at home, initial encounter 07/06/2018   Displaced spiral fracture of shaft of humerus, right arm, initial encounter for closed fracture 07/06/2018   TIA (transient ischemic attack) 07/06/2018   Allergic rhinitis 07/04/2018   Normochromic normocytic anemia 01/20/2018   Impingement syndrome of left shoulder 12/17/2017   Rheumatoid arthritis ([place]) 04/30/2017   Lesion of left humerus 04/29/2017   Adrenal adenoma 03/19/2017   Neuropathy 03/19/2017   Spinal stenosis 03/19/2017   Pericardial effusion 03/14/2017   Diabetes mellitus ([place]) 03/14/2017   Essential hypertension 03/14/2017   Mixed hyperlipidemia 03/14/2017   Ulnar neuropathy at wrist, 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   Meralgia paresthetica of right side 01/10/2017   History of Cushing disease 03/01/2016   Allergic rhinoconjunctivitis 07/09/2015   Asthma, well controlled 07/09/2015   Laryngopharyngeal reflux (LPR) 07/09/2015   PCP:  Deland Pretty, MD Pharmacy:   [facility] [place], [address] [address] Phone: [phone] Fax: [phone]     Social Determinants of Health (SDOH) Interventions    Readmission Risk Interventions   No data to display

## 2022-08-21 NOTE — Telephone Encounter (Signed)
Pharmacy Patient Advocate Encounter  Insurance verification completed.    The patient is insured through [employer] Medicare Part D   The patient is currently admitted and ran test claims for the following: Eliquis .  Copays and coinsurance results were relayed to Inpatient clinical team.      

## 2022-08-21 NOTE — H&P (Signed)
History and Physical  Jocelyn Kaufman:[id number] DOB: Oct 02, 1956 DOA: 08/20/2022  Referring physician: Accepted by Dr. Alfredia Ferguson, [facility], hospitalist service. PCP: Deland Pretty, MD  Outpatient Specialists: Hematology oncology, cardiology, podiatry. Patient coming from: Home.  Chief Complaint: Pleuritic chest pain, worse with taking a deep breath.  HPI: Jocelyn Kaufman is a 66 y.o. female with medical history significant for hypertension, hyperlipidemia, type 2 diabetes, rheumatoid arthritis, who initially presented to [facility] ED with complaints of nonexertional pleuritic chest pain x2 days, worse with taking a deep breath.  Associated with mild shortness of breath.  Denies cough or hemoptysis.  Admits to several falls recently without injuries.  In the ED, work-up revealed CT findings of subsegmental embolus in the posterior right upper lobe.  Right lower extremity ultrasound negative for DVT.  Heparin drip was initiated in the ED.  The patient was admitted by [facility], hospitalist service, Dr. Alfredia Ferguson, and transferred to [facility] telemetry unit as observation status.  ED Course: Tmax 98.8.  BP 136/58, pulse 66, respiration 18, O2 saturation 99% on room air.  Lab studies markable for serum glucose 123, BUN 25, creatinine 1.0 with GFR greater than 60.  BNP 186.  Troponin 34, repeat 45.  Hemoglobin 10.2.  Review of Systems: Review of systems as noted in the HPI. All other systems reviewed and are negative.   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 She has never used smokeless tobacco. She reports that she does not currently use alcohol. She reports that she does not use drugs.   Allergies  Allergen Reactions   Semaglutide(0.25 Or 0.48m-Dos) Other (See Comments)    Cataracts requiring surgery and impairing vision Other reaction(s): Not available   Shellfish-Derived Products Anaphylaxis, Swelling and Other (See Comments)    Patient avoids   Empagliflozin Itching, Swelling and Other (See Comments)    UTI sx's and yeast infections   Oxycodone-Acetaminophen     Other reaction(s): vomiting   Rho (D) Immune Globulin     Other reaction(s): Not available   Semaglutide     Other reaction(s): worsening DM RETINOPATHY   Tramadol Hcl Other (See Comments)    Other reaction(s): hallucinations   Canagliflozin Other (See Comments)    Urinary incontinence Other reaction(s): Not available, urinary incontinence   Latex Rash   Liraglutide Itching    Other reaction(s): itching all over   Minoxidil  Rash    Other reaction(s): skin irritation   Omeprazole Rash    Other reaction(s): Not available   Percocet [Oxycodone-Acetaminophen] Nausea And Vomiting    Family History  Problem Relation Age of Onset   Hypertension Mother    Stroke Mother    Kidney failure Mother    Heart disease Mother        heart murmur   Glaucoma Mother    Stroke Father    COPD Father    Cancer - Prostate Father    Colon cancer Father    Diabetes Sister    Thyroid cancer Sister    Epilepsy Sister     Ovarian cancer Paternal Aunt       Prior to Admission medications   Medication Sig Start Date End Date Taking? Authorizing Provider  acetaminophen (TYLENOL) 500 MG tablet Take 1,000 mg by mouth every 6 (six) hours as needed for mild pain or headache.   Yes [provider]  Aflibercept (EYLEA) 2 MG/0.05ML SOLN 2 mg by Intravitreal route every 3 (three) months. 04/25/20  Yes Nicholas Lose, MD  albuterol (VENTOLIN HFA) 108 (90 Base) MCG/ACT inhaler Inhale two puffs every four to six hours as needed for cough or wheeze. Patient taking differently: Inhale 2 puffs into the lungs See admin instructions. Inhale 2 puffs into the lungs every 4-6 hours as needed for coughing or wheezing 10/05/21  Yes Padgett, Rae Halsted, MD  amLODipine (NORVASC) 10 MG tablet Take 1 tablet (10 mg total) by mouth daily. 02/01/22  Yes Nicholas Lose, MD  aspirin EC 81 MG tablet Take 1 tablet (81 mg total) by mouth daily. Swallow whole. 08/12/20  Yes Nicholas Lose, MD  atorvastatin (LIPITOR) 40 MG tablet Take 1 tablet (40 mg total) by mouth daily at 6 PM. Patient taking differently: Take 40 mg by mouth daily. 07/12/18  Yes Dana Allan I, MD  CHELATED IRON PO Take 18 mg by mouth daily.   Yes [provider]  Cholecalciferol (VITAMIN D3) 125 MCG (5000 UT) CAPS Take 5,000 Units by mouth daily.   Yes [provider]  Dulaglutide (TRULICITY) 1.5 AN/1.9TY SOPN Inject 1.5 mg into the skin every Tuesday.   Yes [provider]  EPINEPHrine (EPIPEN 2-PAK) 0.3 mg/0.3 mL IJ SOAJ injection Use as directed for life-threatening allergic reaction. 04/05/22  Yes Kennith Gain, MD  epoetin alfa-epbx (RETACRIT) 2000 UNIT/ML injection See admin instructions.   Yes [provider]  escitalopram (LEXAPRO) 20 MG tablet Take 20 mg by mouth daily.   Yes [provider]  ezetimibe (ZETIA) 10 MG tablet Take 1 tablet (10 mg total) by mouth daily. 04/25/20  Yes Nicholas Lose, MD   fexofenadine (ALLEGRA ALLERGY) 180 MG tablet Take 1 tablet (180 mg total) by mouth daily. 11/21/21  Yes Nicholas Lose, MD  folic acid (FOLVITE) 1 MG tablet Take 2 mg by mouth daily.   Yes [provider]  hydrALAZINE (APRESOLINE) 50 MG tablet Take 50 mg by mouth 3 (three) times daily.   Yes [provider]  HYDROcodone-acetaminophen (NORCO) 5-325 MG tablet Take 1 tablet by mouth every 6 (six) hours as needed for severe pain. 05/26/22  Yes Sherwood Gambler, MD  insulin aspart (NOVOLOG) 100 UNIT/ML FlexPen Inject 20 Units into the skin See admin instructions. Inject 20 units into the skin two to three times a day before meals   Yes [provider]  insulin glargine, 2 Unit Dial, (TOUJEO MAX SOLOSTAR) 300 UNIT/ML Solostar Pen Inject 46 Units  into the skin daily. Patient taking differently: Inject 42 Units into the skin at bedtime. 02/02/21  Yes Nicholas Lose, MD  ipratropium (ATROVENT) 0.06 % nasal spray Place 1-2 sprays each nostril as needed up to 3 times a day Patient taking differently: Place 1-2 sprays into both nostrils 3 (three) times daily as needed for rhinitis. 04/05/22  Yes Padgett, Rae Halsted, MD  irbesartan (AVAPRO) 300 MG tablet Take 1 tablet (300 mg total) by mouth daily. 02/01/22  Yes Nicholas Lose, MD  latanoprost (XALATAN) 0.005 % ophthalmic solution Place 1 drop into both eyes at bedtime.   Yes [provider]  LORazepam (ATIVAN) 0.5 MG tablet Take 0.5 mg by mouth daily as needed for anxiety.   Yes [provider]  metaxalone (SKELAXIN) 800 MG tablet Take 800 mg by mouth 3 (three) times daily as needed for muscle spasms.   Yes [provider]  Methotrexate Sodium (METHOTREXATE, PF,) 50 MG/2ML injection Inject 20 mg into the muscle every Wednesday. 06/26/22  Yes [provider]  metoprolol succinate (TOPROL-XL) 25 MG 24 hr tablet Take 1 tablet (25 mg total) by mouth daily. May take additional half tablet as needed once daily  for palpitations. Patient taking differently: Take 12.5-25 mg by mouth See admin instructions. Take 25 mg by mouth once a day and an additional 12.5 mg (half tablet) as needed once daily for palpitations 05/07/22  Yes Loel Dubonnet, NP  Multiple Vitamins-Minerals (CENTRUM SILVER PO) Take 1 tablet by mouth daily with breakfast.   Yes [provider]  ondansetron (ZOFRAN) 4 MG tablet Take 4 mg by mouth every 8 (eight) hours as needed for nausea or vomiting.   Yes [provider]  sulfaSALAzine (AZULFIDINE) 500 MG tablet Take 3 tablets (1,500 mg total) by mouth 2 (two) times daily. 11/21/21  Yes Nicholas Lose, MD  SYNTHROID 125 MCG tablet Take 125 mcg by mouth daily before breakfast.   Yes [provider]  tocilizumab (ACTEMRA) 400 MG/20ML SOLN injection Inject into the vein every 28 (twenty-eight) days. 02/08/22  Yes [provider]  triamcinolone cream (KENALOG) 0.1 % Apply 1 Application topically 3 (three) times daily as needed (for itching- affected areas). 08/03/22  Yes [provider]  vitamin B-12 (CYANOCOBALAMIN) 1000 MCG tablet Take 1,000 mcg by mouth daily.   Yes [provider]  Accu-Chek Softclix Lancets lancets use to check blood sugar 04/26/21   [provider]  B-D ULTRAFINE III SHORT PEN 31G X 8 MM MISC SMARTSIG:1 Each SUB-Q Daily 02/12/20   [provider]  Beclomethasone Dipropionate (QVAR IN) Inhale 2 puffs into the lungs 3 (three) times daily as needed.    [provider]  ciclopirox (PENLAC) 8 % solution Apply topically at bedtime. Apply over nail and surrounding skin. Apply daily over previous coat. After seven (7) days, may remove with alcohol and continue cycle. Patient not taking: Reported on 08/20/2022 11/09/21   Criselda Peaches, DPM  COVID-19 mRNA bivalent vaccine, Pfizer, (PFIZER COVID-19 VAC BIVALENT) injection Inject into the muscle. Patient not taking: Reported on 08/20/2022 12/01/21   Carlyle Basques, MD  insulin aspart protamine - aspart (NOVOLOG 70/30 FLEXPEN RELION) (70-30) 100 UNIT/ML FlexPen Inject 0.15-0.2 mLs (15-20 Units total) into the skin in the morning, at noon, and at bedtime. Patient not taking: Reported on 08/20/2022 08/12/20   Nicholas Lose, MD  Lancets Misc. (ACCU-CHEK SOFTCLIX LANCET DEV) KIT See admin instructions. 04/25/21   [provider]  levothyroxine (SYNTHROID) 125 MCG  tablet Take 125 mcg by mouth daily before breakfast. BRAND ONLY Patient not taking: Reported on 08/20/2022    [provider]    Physical Exam: BP (!) 136/58 (BP Location: Right Arm)   Pulse 67   Temp 98.8 F (37.1 C) (Oral)   Resp 18   SpO2 99%   General: 66 y.o. year-old female well developed well nourished in no acute distress.  Alert and oriented x3. Cardiovascular: Regular rate and rhythm with no rubs or gallops.  No thyromegaly or JVD noted.  Trace lower extremity edema bilaterally Respiratory: Clear to auscultation with no wheezes or rales. Good inspiratory effort. Abdomen: Soft nontender nondistended with normal bowel sounds x4 quadrants. Muskuloskeletal: No cyanosis or clubbing.  Trace lower extremity edema noted bilaterally Neuro: CN II-XII intact, strength, sensation, reflexes Skin: No ulcerative lesions noted or rashes Psychiatry: Judgement and insight appear normal. Mood is appropriate for condition and setting          Labs on Admission:  Basic Metabolic Panel: Recent Labs  Lab 08/20/22 1244  NA 138  K 4.3  CL 105  CO2 24  GLUCOSE 123*  BUN 25*  CREATININE 1.00  CALCIUM 9.1   Liver Function Tests: No results for input(s): "AST", "ALT", "ALKPHOS", "BILITOT", "PROT", "ALBUMIN" in the last 168 hours. No results for input(s): "LIPASE", "AMYLASE" in the last 168 hours. No results for input(s): "AMMONIA" in the last 168 hours. CBC: Recent Labs  Lab 08/16/22 0839 08/20/22 1244  WBC 6.6 5.8  NEUTROABS 4.5  --   HGB 10.5* 10.2*  HCT 33.6*  33.2*  MCV 92.8 91.5  PLT 203 188   Cardiac Enzymes: No results for input(s): "CKTOTAL", "CKMB", "CKMBINDEX", "TROPONINI" in the last 168 hours.  BNP (last 3 results) Recent Labs    08/20/22 1244  BNP 186.3*    ProBNP (last 3 results) No results for input(s): "PROBNP" in the last 8760 hours.  CBG: Recent Labs  Lab 08/20/22 1725  GLUCAP 90    Radiological Exams on Admission: US Venous Img Lower Unilateral Right  Result Date: 08/20/2022 CLINICAL DATA:  RLE swelling. Bruising. Pain. Recent diastole injury EXAM: Right LOWER EXTREMITY VENOUS DOPPLER ULTRASOUND TECHNIQUE: Gray-scale sonography with compression, as well as color and duplex ultrasound, were performed to evaluate the deep venous system(s) from the level of the common femoral vein through the popliteal and proximal calf veins. COMPARISON:  None Available. FINDINGS: VENOUS Normal compressibility of the common femoral, superficial femoral, and popliteal veins, as well as the visualized calf veins. Visualized portions of profunda femoral vein and great saphenous vein unremarkable. No filling defects to suggest DVT on grayscale or color Doppler imaging. Doppler waveforms show normal direction of venous flow, normal respiratory plasticity and response to augmentation. Limited views of the contralateral common femoral vein are unremarkable. OTHER Vague 3.5 x 1 cm hypoechoic lesion within the popliteal fossa. Limitations: none IMPRESSION: 1. No deep venous thrombosis of the right lower extremity. 2. Vague 3.5 x 1 cm hypoechoic lesion within the popliteal fossa. Finding may represent a hematoma versus Baker cyst. Electronically Signed   By: Iven Finn M.D.   On: 08/20/2022 16:10   CT Angio Chest PE W and/or Wo Contrast  Result Date: 08/20/2022 CLINICAL DATA:  PE suspected, right-sided chest pain EXAM: CT ANGIOGRAPHY CHEST WITH CONTRAST TECHNIQUE: Multidetector CT imaging of the chest was performed using the standard protocol  during bolus administration of intravenous contrast. Multiplanar CT image reconstructions and MIPs were obtained to evaluate the  vascular anatomy. RADIATION DOSE REDUCTION: This exam was performed according to the departmental dose-optimization program which includes automated exposure control, adjustment of the mA and/or kV according to patient size and/or use of iterative reconstruction technique. CONTRAST:  130m OMNIPAQUE IOHEXOL 350 MG/ML SOLN COMPARISON:  None Available. FINDINGS: Cardiovascular: Satisfactory opacification of the pulmonary arteries to the segmental level. Positive examination for pulmonary embolus, with subsegmental embolus present in the posterior right upper lobe (series 4, image 48). Cardiomegaly. Scattered left and right coronary artery calcifications. Enlargement of the main pulmonary artery measuring up to 3.6 cm in caliber no pericardial effusion. Scattered aortic atherosclerosis. Mediastinum/Nodes: No enlarged mediastinal, hilar, or axillary lymph nodes. Thyroid gland, trachea, and esophagus demonstrate no significant findings. Lungs/Pleura: Diffuse bilateral bronchial wall thickening. Interlobular septal thickening throughout the lungs. No pleural effusion or pneumothorax. Upper Abdomen: No acute abnormality. Benign, non nodular adenomatous thickening of the bilateral adrenal glands, for which no further follow-up or characterization is required. Musculoskeletal: No chest wall abnormality. No acute osseous findings. Review of the MIP images confirms the above findings. IMPRESSION: 1. Positive examination for pulmonary embolism, with subsegmental embolus present in the posterior right upper lobe. No other evidence of pulmonary embolism. 2. Interlobular septal thickening and diffuse bilateral bronchial wall thickening, consistent with edema. 3. Cardiomegaly and coronary artery disease. 4. Enlargement of the main pulmonary artery, as can be seen in pulmonary hypertension. These results  were called by telephone at the time of interpretation on 08/20/2022 at 3:58 pm to Dr. RMargaretmary Eddy, who verbally acknowledged these results. Aortic Atherosclerosis (ICD10-I70.0). Electronically Signed   By: ADelanna AhmadiM.D.   On: 08/20/2022 16:06    EKG: I independently viewed the EKG done and my findings are as followed: Normal sinus rhythm rate of 66.  Nonspecific ST-T changes.  QTc 442.  Assessment/Plan Present on Admission:  Pulmonary embolus (HCC)  Principal Problem:   Pulmonary embolus (HCC)  Subsegmental pulmonary embolus in the posterior right upper lobe, POA Seen on CT scan, no reported right heart strain Currently on heparin drip Follow 2D echo Consider switching to DOAC in the morning Analgesics as needed  Elevated troponin, suspect demand ischemia in the setting of pulmonary embolism High-sensitivity troponin peaked at 45 No evidence of acute ischemia on 12 EKG Follow 2D echo Monitor on telemetry  Essential hypertension, BP is not at goal, elevated Resume home oral antihypertensive Monitor vital signs  Type 2 diabetes with hyperglycemia Obtain hemoglobin A1c Start insulin sliding scale.    DVT prophylaxis: Full dose IV heparin  Code Status: Full code  Family Communication: None at bedside  Disposition Plan: Admitted to telemetry unit  Consults called: None  Admission status: Observation status   Status is: Observation    CKayleen MemosMD [facility] Pager [phone] If 7PM-7AM, please contact night-coverage [email or web address] Password [email or web address]  08/21/2022, 1:22 AM

## 2022-08-22 DIAGNOSIS — I2699 Other pulmonary embolism without acute cor pulmonale: Secondary | ICD-10-CM | POA: Diagnosis not present

## 2022-08-22 LAB — CBC
HCT: 31.2 % — ABNORMAL LOW (ref 36.0–46.0)
Hemoglobin: 9.4 g/dL — ABNORMAL LOW (ref 12.0–15.0)
MCH: 28.1 pg (ref 26.0–34.0)
MCHC: 30.1 g/dL (ref 30.0–36.0)
MCV: 93.1 fL (ref 80.0–100.0)
Platelets: 156 10*3/uL (ref 150–400)
RBC: 3.35 MIL/uL — ABNORMAL LOW (ref 3.87–5.11)
RDW: 14.4 % (ref 11.5–15.5)
WBC: 5.6 10*3/uL (ref 4.0–10.5)
nRBC: 0 % (ref 0.0–0.2)

## 2022-08-22 LAB — GLUCOSE, CAPILLARY
Glucose-Capillary: 116 mg/dL — ABNORMAL HIGH (ref 70–99)
Glucose-Capillary: 137 mg/dL — ABNORMAL HIGH (ref 70–99)

## 2022-08-22 NOTE — Progress Notes (Signed)
Chaplain met with Summerlyn to provide support through this difficult health challenge that has changed how she is able to care for herself.  She had many memories come up from childhood that she didn't want to think about, some of which were traumatic.  We explored why these might be coming up now and Maricsa was able to name that she was grieving herself--her loss of independence but also some of the ways that she has had to cope over time.  Taniesha used school as a safe haven when she was young.  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  She is aware that she needs to make a safe haven now in her home but that it has not felt safe lately because that is where she has now fallen twice.  We talked about different ways to once again make her home her safe haven (including cleaning clutter to reduce fall risks, putting in rails and non-slip mats in her shower, and even getting an emergency alert that she wears so that she could call for help if she falls again) and about asking for help from friends and family for help with that.  We also discussed counseling to help process and cope with the memories that have been coming up. She had already been thinking about doing this and felt that asking for the chaplain was her way of starting this process.  Chaplain provided listening as well as help with reframing her current situation and the implications it may be having on her mental and emotional well-being.  [address], [place] Pager, [phone]

## 2022-08-22 NOTE — Discharge Summary (Signed)
Physician Discharge Summary  BRYNA RAZAVI PVX:[id number] DOB: 1956/07/05 DOA: 08/20/2022  PCP: Deland Pretty, MD  Admit date: 08/20/2022 Discharge date: 08/22/2022  Admitted From: Home Disposition: Home with outpatient PT/OT  Recommendations for Outpatient Follow-up:  Follow up with PCP in 1-2 weeks Please obtain BMP/CBC in one week   Home Health: Outpatient therapies Equipment/Devices: None  Discharge Condition: Stable CODE STATUS: Full code Diet recommendation: Low-salt diet  Discharge summary: Patient has multiple medical issues including rheumatoid arthritis, hypertension, ambulatory dysfunction, right quadriceps tendon repair 2 months ago and wearing braces, fell last week with musculoskeletal pain presented with ongoing right-sided anterior chest wall pain.  Skeletal survey was negative, however she was found to have a small subsegmental pulmonary embolism likely precipitated due to immobilization of the right leg for 6 weeks.  Duplex is negative for DVT.  Monitored in the hospital.  Remained clinically well.  Discharged with Eliquis.  Suggested 3 months of therapy due to provoked PE. She has multiple other medical issues, she is also on aspirin 81 mg daily for TIA prevention.  She will hold aspirin until she is on Eliquis and she will go back on Eliquis after that.  Medically stable for d room ischarge.  She does have ambulatory dysfunction and she will work with her therapy at outpatient office.   Discharge Diagnoses:  Principal Problem:   Pulmonary embolus [facility])    Discharge Instructions  Discharge Instructions     Diet - low sodium heart healthy   Complete by: As directed    Diet Carb Modified   Complete by: As directed    Increase activity slowly   Complete by: As directed       Allergies as of 08/22/2022       Reactions   Semaglutide(0.25 Or 0.80m-dos) Other (See Comments)   Cataracts requiring surgery and impairing vision Other reaction(s): Not  available   Shellfish-derived Products Anaphylaxis, Swelling, Other (See Comments)   Patient avoids   Empagliflozin Itching, Swelling, Other (See Comments)   UTI sx's and yeast infections   Oxycodone-acetaminophen    Other reaction(s): vomiting   Rho (d) Immune Globulin    Other reaction(s): Not available   Semaglutide    Other reaction(s): worsening DM RETINOPATHY   Tramadol Hcl Other (See Comments)   Other reaction(s): hallucinations   Canagliflozin Other (See Comments)   Urinary incontinence Other reaction(s): Not available, urinary incontinence   Latex Rash   Liraglutide Itching   Other reaction(s): itching all over   Minoxidil Rash   Other reaction(s): skin irritation   Omeprazole Rash   Other reaction(s): Not available   Percocet [oxycodone-acetaminophen] Nausea And Vomiting        Medication List     STOP taking these medications    aspirin EC 81 MG tablet   NovoLOG 70/30 FlexPen (70-30) 100 UNIT/ML FlexPen Generic drug: insulin aspart protamine - aspart   Pfizer COVID-19 Vac Bivalent injection Generic drug: COVID-19 mRNA bivalent vaccine (Therapist, music       TAKE these medications    Accu-Chek Softclix Lancet Dev Kit See admin instructions.   Accu-Chek Softclix Lancets lancets use to check blood sugar   acetaminophen 500 MG tablet Commonly known as: TYLENOL Take 1,000 mg by mouth every 6 (six) hours as needed for mild pain or headache.   Actemra 400 MG/20ML Soln injection Generic drug: tocilizumab Inject into the vein every 28 (twenty-eight) days.   albuterol 108 (90 Base) MCG/ACT inhaler Commonly known as: VENTOLIN HFA Inhale two  puffs every four to six hours as needed for cough or wheeze. What changed:  how much to take how to take this when to take this additional instructions   amLODipine 10 MG tablet Commonly known as: NORVASC Take 1 tablet (10 mg total) by mouth daily.   atorvastatin 40 MG tablet Commonly known as: LIPITOR Take 1  tablet (40 mg total) by mouth daily at 6 PM. What changed: when to take this   B-D ULTRAFINE III SHORT PEN 31G X 8 MM Misc Generic drug: Insulin Pen Needle SMARTSIG:1 Each SUB-Q Daily   CENTRUM SILVER PO Take 1 tablet by mouth daily with breakfast.   CHELATED IRON PO Take 18 mg by mouth daily.   ciclopirox 8 % solution Commonly known as: PENLAC Apply topically at bedtime. Apply over nail and surrounding skin. Apply daily over previous coat. After seven (7) days, may remove with alcohol and continue cycle.   cyanocobalamin 1000 MCG tablet Commonly known as: VITAMIN B12 Take 1,000 mcg by mouth daily.   Eliquis DVT/PE Starter Pack Generic drug: Apixaban Starter Pack (76m and 590m Take as directed on package: start with two-32m42mablets twice daily for 7 days. On day 8, switch to one-32mg84mblet twice daily.   EPINEPHrine 0.3 mg/0.3 mL Soaj injection Commonly known as: EpiPen 2-Pak Use as directed for life-threatening allergic reaction.   escitalopram 20 MG tablet Commonly known as: LEXAPRO Take 20 mg by mouth daily.   Eylea 2 MG/0.05ML Soln Generic drug: Aflibercept 2 mg by Intravitreal route every 3 (three) months.   ezetimibe 10 MG tablet Commonly known as: Zetia Take 1 tablet (10 mg total) by mouth daily.   fexofenadine 180 MG tablet Commonly known as: Allegra Allergy Take 1 tablet (180 mg total) by mouth daily.   folic acid 1 MG tablet Commonly known as: FOLVITE Take 2 mg by mouth daily.   hydrALAZINE 50 MG tablet Commonly known as: APRESOLINE Take 50 mg by mouth 3 (three) times daily.   HYDROcodone-acetaminophen 5-325 MG tablet Commonly known as: Norco Take 1 tablet by mouth every 6 (six) hours as needed for severe pain.   insulin aspart 100 UNIT/ML FlexPen Commonly known as: NOVOLOG Inject 20 Units into the skin See admin instructions. Inject 20 units into the skin two to three times a day before meals   ipratropium 0.06 % nasal spray Commonly known as:  ATROVENT Place 1-2 sprays each nostril as needed up to 3 times a day What changed:  how much to take how to take this when to take this reasons to take this additional instructions   irbesartan 300 MG tablet Commonly known as: AVAPRO Take 1 tablet (300 mg total) by mouth daily.   latanoprost 0.005 % ophthalmic solution Commonly known as: XALATAN Place 1 drop into both eyes at bedtime.   LORazepam 0.5 MG tablet Commonly known as: ATIVAN Take 0.5 mg by mouth daily as needed for anxiety.   metaxalone 800 MG tablet Commonly known as: SKELAXIN Take 800 mg by mouth 3 (three) times daily as needed for muscle spasms.   methotrexate (PF) 50 MG/2ML injection Inject 20 mg into the muscle every Wednesday.   metoprolol succinate 25 MG 24 hr tablet Commonly known as: TOPROL-XL Take 1 tablet (25 mg total) by mouth daily. May take additional half tablet as needed once daily for palpitations. What changed:  how much to take when to take this additional instructions   ondansetron 4 MG tablet Commonly known as: ZOFRAN Take 4  mg by mouth every 8 (eight) hours as needed for nausea or vomiting.   QVAR IN Inhale 2 puffs into the lungs 3 (three) times daily as needed.   Retacrit 2000 UNIT/ML injection Generic drug: epoetin alfa-epbx See admin instructions.   sulfaSALAzine 500 MG tablet Commonly known as: AZULFIDINE Take 3 tablets (1,500 mg total) by mouth 2 (two) times daily.   Synthroid 125 MCG tablet Generic drug: levothyroxine Take 125 mcg by mouth daily before breakfast. What changed: Another medication with the same name was removed. Continue taking this medication, and follow the directions you see here.   Toujeo Max SoloStar 300 UNIT/ML Solostar Pen Generic drug: insulin glargine (2 Unit Dial) Inject 46 Units into the skin daily. What changed:  how much to take when to take this   triamcinolone cream 0.1 % Commonly known as: KENALOG Apply 1 Application topically 3  (three) times daily as needed (for itching- affected areas).   Trulicity 1.5 GD/9.2EQ Sopn Generic drug: Dulaglutide Inject 1.5 mg into the skin every Tuesday.   Vitamin D3 125 MCG (5000 UT) Caps Take 5,000 Units by mouth daily.        Allergies  Allergen Reactions   Semaglutide(0.25 Or 0.65m-Dos) Other (See Comments)    Cataracts requiring surgery and impairing vision Other reaction(s): Not available   Shellfish-Derived Products Anaphylaxis, Swelling and Other (See Comments)    Patient avoids   Empagliflozin Itching, Swelling and Other (See Comments)    UTI sx's and yeast infections   Oxycodone-Acetaminophen     Other reaction(s): vomiting   Rho (D) Immune Globulin     Other reaction(s): Not available   Semaglutide     Other reaction(s): worsening DM RETINOPATHY   Tramadol Hcl Other (See Comments)    Other reaction(s): hallucinations   Canagliflozin Other (See Comments)    Urinary incontinence Other reaction(s): Not available, urinary incontinence   Latex Rash   Liraglutide Itching    Other reaction(s): itching all over   Minoxidil Rash    Other reaction(s): skin irritation   Omeprazole Rash    Other reaction(s): Not available   Percocet [Oxycodone-Acetaminophen] Nausea And Vomiting    Consultations: None   Procedures/Studies: ECHOCARDIOGRAM COMPLETE  Result Date: 08/21/2022    ECHOCARDIOGRAM REPORT   Patient Name:   KDELAINY MCELHINEYDate of Exam: 08/21/2022 Medical Rec #:  [id number]   Height:       64.5 in Accession #:    [id number]  Weight:       238.5 lb Date of Birth:  2February 15, 1957   BSA:          2.120 m Patient Age:    89years     BP:           134/73 mmHg Patient Gender: F            HR:           63 bpm. Exam Location:  Inpatient Procedure: 2D Echo, Cardiac Doppler and Color Doppler Indications:    Pulmonary Embolus I26.09  History:        Patient has prior history of Echocardiogram examinations, most                 recent 05/18/2022. Arrythmias:Atrial  Fibrillation; Risk                 Factors:Hypertension, Dyslipidemia and Diabetes.  Sonographer:    SBernadene PersonRDCS Referring Phys: [id number] 1.  Left ventricular ejection fraction, by estimation, is 45 to 50%. Left ventricular ejection fraction by 2D MOD biplane is 49.1 %. The left ventricle has mildly decreased function. The left ventricle demonstrates global hypokinesis. Indeterminate diastolic filling due to E-A fusion.  2. Right ventricular systolic function is normal. The right ventricular size is normal. Tricuspid regurgitation signal is inadequate for assessing PA pressure.  3. A small pericardial effusion is present. The pericardial effusion is circumferential.  4. The mitral valve is degenerative. Mild mitral valve regurgitation. No evidence of mitral stenosis.  5. The aortic valve is tricuspid. There is mild calcification of the aortic valve. There is mild thickening of the aortic valve. Aortic valve regurgitation is not visualized. Aortic valve sclerosis/calcification is present, without any evidence of aortic stenosis.  6. The inferior vena cava is normal in size with greater than 50% respiratory variability, suggesting right atrial pressure of 3 mmHg. Comparison(s): Changes from prior study are noted. EF mildly reduced on this study. FINDINGS  Left Ventricle: Left ventricular ejection fraction, by estimation, is 45 to 50%. Left ventricular ejection fraction by 2D MOD biplane is 49.1 %. The left ventricle has mildly decreased function. The left ventricle demonstrates global hypokinesis. The left ventricular internal cavity size was normal in size. There is no left ventricular hypertrophy. Indeterminate diastolic filling due to E-A fusion. Right Ventricle: The right ventricular size is normal. No increase in right ventricular wall thickness. Right ventricular systolic function is normal. Tricuspid regurgitation signal is inadequate for assessing PA pressure. Left Atrium: Left  atrial size was normal in size. Right Atrium: Right atrial size was normal in size. Pericardium: A small pericardial effusion is present. The pericardial effusion is circumferential. Mitral Valve: The mitral valve is degenerative in appearance. Mild to moderate mitral annular calcification. Mild mitral valve regurgitation. No evidence of mitral valve stenosis. Tricuspid Valve: The tricuspid valve is grossly normal. Tricuspid valve regurgitation is trivial. No evidence of tricuspid stenosis. Aortic Valve: The aortic valve is tricuspid. There is mild calcification of the aortic valve. There is mild thickening of the aortic valve. Aortic valve regurgitation is not visualized. Aortic valve sclerosis/calcification is present, without any evidence of aortic stenosis. Pulmonic Valve: The pulmonic valve was grossly normal. Pulmonic valve regurgitation is trivial. No evidence of pulmonic stenosis. Aorta: The aortic root and ascending aorta are structurally normal, with no evidence of dilitation. Venous: The inferior vena cava is normal in size with greater than 50% respiratory variability, suggesting right atrial pressure of 3 mmHg. IAS/Shunts: The atrial septum is grossly normal.  LEFT VENTRICLE PLAX 2D                        Biplane EF (MOD) LVIDd:         5.60 cm         LV Biplane EF:   Left LVIDs:         4.20 cm                          ventricular LV PW:         1.10 cm                          ejection LV IVS:        1.00 cm  fraction by LVOT diam:     2.10 cm                          2D MOD LV SV:         114                              biplane is LV SV Index:   54                               49.1 %. LVOT Area:     3.46 cm                                Diastology                                LV e' medial:    4.33 cm/s LV Volumes (MOD)               LV E/e' medial:  34.9 LV vol d, MOD    93.6 ml       LV e' lateral:   3.83 cm/s A2C:                           LV E/e' lateral: 39.4 LV vol  d, MOD    101.0 ml A4C: LV vol s, MOD    46.8 ml A2C: LV vol s, MOD    52.0 ml A4C: LV SV MOD A2C:   46.8 ml LV SV MOD A4C:   101.0 ml LV SV MOD BP:    47.8 ml RIGHT VENTRICLE RV S prime:     9.83 cm/s TAPSE (M-mode): 1.9 cm LEFT ATRIUM             Index        RIGHT ATRIUM           Index LA diam:        4.60 cm 2.17 cm/m   RA Area:     14.90 cm LA Vol (A2C):   68.3 ml 32.22 ml/m  RA Volume:   30.30 ml  14.29 ml/m LA Vol (A4C):   64.8 ml 30.57 ml/m LA Biplane Vol: 67.7 ml 31.94 ml/m  AORTIC VALVE              PULMONIC VALVE LVOT Vmax:   159.00 cm/s  PR End Diast Vel: 4.16 msec LVOT Vmean:  105.000 cm/s LVOT VTI:    0.330 m  AORTA Ao Root diam: 2.90 cm Ao Asc diam:  3.50 cm MITRAL VALVE MV Area (PHT): 4.60 cm     SHUNTS MV Decel Time: 165 msec     Systemic VTI:  0.33 m MV E velocity: 151.00 cm/s  Systemic Diam: 2.10 cm MV A velocity: 34.50 cm/s MV E/A ratio:  4.38 Eleonore Chiquito MD Electronically signed by Eleonore Chiquito MD Signature Date/Time: 08/21/2022/10:35:22 AM    Final    US Venous Img Lower Unilateral Right  Result Date: 08/20/2022 CLINICAL DATA:  RLE swelling. Bruising. Pain. Recent diastole injury EXAM: Right LOWER EXTREMITY VENOUS DOPPLER ULTRASOUND TECHNIQUE: Gray-scale sonography with compression, as well as color and duplex ultrasound, were performed to evaluate  the deep venous system(s) from the level of the common femoral vein through the popliteal and proximal calf veins. COMPARISON:  None Available. FINDINGS: VENOUS Normal compressibility of the common femoral, superficial femoral, and popliteal veins, as well as the visualized calf veins. Visualized portions of profunda femoral vein and great saphenous vein unremarkable. No filling defects to suggest DVT on grayscale or color Doppler imaging. Doppler waveforms show normal direction of venous flow, normal respiratory plasticity and response to augmentation. Limited views of the contralateral common femoral vein are unremarkable. OTHER  Vague 3.5 x 1 cm hypoechoic lesion within the popliteal fossa. Limitations: none IMPRESSION: 1. No deep venous thrombosis of the right lower extremity. 2. Vague 3.5 x 1 cm hypoechoic lesion within the popliteal fossa. Finding may represent a hematoma versus Baker cyst. Electronically Signed   By: Iven Finn M.D.   On: 08/20/2022 16:10   CT Angio Chest PE W and/or Wo Contrast  Result Date: 08/20/2022 CLINICAL DATA:  PE suspected, right-sided chest pain EXAM: CT ANGIOGRAPHY CHEST WITH CONTRAST TECHNIQUE: Multidetector CT imaging of the chest was performed using the standard protocol during bolus administration of intravenous contrast. Multiplanar CT image reconstructions and MIPs were obtained to evaluate the vascular anatomy. RADIATION DOSE REDUCTION: This exam was performed according to the departmental dose-optimization program which includes automated exposure control, adjustment of the mA and/or kV according to patient size and/or use of iterative reconstruction technique. CONTRAST:  140m OMNIPAQUE IOHEXOL 350 MG/ML SOLN COMPARISON:  None Available. FINDINGS: Cardiovascular: Satisfactory opacification of the pulmonary arteries to the segmental level. Positive examination for pulmonary embolus, with subsegmental embolus present in the posterior right upper lobe (series 4, image 48). Cardiomegaly. Scattered left and right coronary artery calcifications. Enlargement of the main pulmonary artery measuring up to 3.6 cm in caliber no pericardial effusion. Scattered aortic atherosclerosis. Mediastinum/Nodes: No enlarged mediastinal, hilar, or axillary lymph nodes. Thyroid gland, trachea, and esophagus demonstrate no significant findings. Lungs/Pleura: Diffuse bilateral bronchial wall thickening. Interlobular septal thickening throughout the lungs. No pleural effusion or pneumothorax. Upper Abdomen: No acute abnormality. Benign, non nodular adenomatous thickening of the bilateral adrenal glands, for which no  further follow-up or characterization is required. Musculoskeletal: No chest wall abnormality. No acute osseous findings. Review of the MIP images confirms the above findings. IMPRESSION: 1. Positive examination for pulmonary embolism, with subsegmental embolus present in the posterior right upper lobe. No other evidence of pulmonary embolism. 2. Interlobular septal thickening and diffuse bilateral bronchial wall thickening, consistent with edema. 3. Cardiomegaly and coronary artery disease. 4. Enlargement of the main pulmonary artery, as can be seen in pulmonary hypertension. These results were called by telephone at the time of interpretation on 08/20/2022 at 3:58 pm to Dr. RMargaretmary Eddy, who verbally acknowledged these results. Aortic Atherosclerosis (ICD10-I70.0). Electronically Signed   By: ADelanna AhmadiM.D.   On: 08/20/2022 16:06   MYOCARDIAL PERFUSION IMAGING  Result Date: 07/31/2022   The study is normal. The study is low risk.   No ST deviation was noted.   LV perfusion is normal. There is no evidence of ischemia. There is no evidence of infarction.   Left ventricular function is normal. Nuclear stress EF: 55 %. The left ventricular ejection fraction is normal (55-65%). End diastolic cavity size is normal. End systolic cavity size is normal.   Prior study available for comparison from 10/16/2018. Breast attenuation no ischemia normal EF 55%   (Echo, Carotid, EGD, Colonoscopy, ERCP)    Subjective: Patient was seen and  examined.  No overnight events.  Chest pain has improved and she is able to take deep breaths.  Her pain is mostly on her rib cage than the pleuritic area.  Explained to the patient that the her body pain is likely from fall and not likely from pulmonary embolism.  With her immobility, she will benefit with anticoagulation for 3 months.   Discharge Exam: Vitals:   08/21/22 1846 08/22/22 0635  BP: (!) 135/58 (!) 145/75  Pulse:  68  Resp:  18  Temp:  98.3 F (36.8 C)  SpO2:  94% 98%   Vitals:   08/21/22 1809 08/21/22 1846 08/22/22 0300 08/22/22 0635  BP: (!) 134/58 (!) 135/58  (!) 145/75  Pulse:    68  Resp:    18  Temp:    98.3 F (36.8 C)  TempSrc:    Oral  SpO2:  94%  98%  Weight:   101.8 kg   Height:        General: Pt is alert, awake, not in acute distress Cardiovascular: RRR, S1/S2 +, no rubs, no gallops Respiratory: CTA bilaterally, no wheezing, no rhonchi, no palpable tenderness. Abdominal: Soft, NT, ND, bowel sounds + Extremities: no edema, no cyanosis    The results of significant diagnostics from this hospitalization (including imaging, microbiology, ancillary and laboratory) are listed below for reference.     Microbiology: No results found for this or any previous visit (from the past 240 hour(s)).   Labs: BNP (last 3 results) Recent Labs    08/20/22 1244  BNP 680.8*   Basic Metabolic Panel: Recent Labs  Lab 08/20/22 1244 08/21/22 0756  NA 138 137  K 4.3 4.1  CL 105 107  CO2 24 25  GLUCOSE 123* 85  BUN 25* 19  CREATININE 1.00 1.08*  CALCIUM 9.1 8.5*  MG  --  1.9  PHOS  --  4.7*   Liver Function Tests: Recent Labs  Lab 08/21/22 0756  AST 29  ALT 37  ALKPHOS 56  BILITOT 0.6  PROT 5.3*  ALBUMIN 3.3*   No results for input(s): "LIPASE", "AMYLASE" in the last 168 hours. No results for input(s): "AMMONIA" in the last 168 hours. CBC: Recent Labs  Lab 08/16/22 0839 08/20/22 1244 08/21/22 0756 08/22/22 0430  WBC 6.6 5.8 4.5 5.6  NEUTROABS 4.5  --   --   --   HGB 10.5* 10.2* 10.4* 9.4*  HCT 33.6* 33.2* 34.3* 31.2*  MCV 92.8 91.5 94.5 93.1  PLT 203 188 185 156   Cardiac Enzymes: No results for input(s): "CKTOTAL", "CKMB", "CKMBINDEX", "TROPONINI" in the last 168 hours. BNP: Invalid input(s): "POCBNP" CBG: Recent Labs  Lab 08/21/22 0751 08/21/22 1132 08/21/22 1640 08/21/22 2049 08/22/22 0748  GLUCAP 83 121* 97 155* 116*   D-Dimer No results for input(s): "DDIMER" in the last 72 hours. Hgb  A1c Recent Labs    08/21/22 0756  HGBA1C 5.6   Lipid Profile No results for input(s): "CHOL", "HDL", "LDLCALC", "TRIG", "CHOLHDL", "LDLDIRECT" in the last 72 hours. Thyroid function studies No results for input(s): "TSH", "T4TOTAL", "T3FREE", "THYROIDAB" in the last 72 hours.  Invalid input(s): "FREET3" Anemia work up No results for input(s): "VITAMINB12", "FOLATE", "FERRITIN", "TIBC", "IRON", "RETICCTPCT" in the last 72 hours. Urinalysis    Component Value Date/Time   COLORURINE YELLOW 07/06/2018 1432   APPEARANCEUR CLEAR 07/06/2018 1432   LABSPEC 1.015 07/06/2018 1432   PHURINE 5.0 07/06/2018 1432   GLUCOSEU >=500 (A) 07/06/2018 1432   HGBUR NEGATIVE  07/06/2018 [place] 07/06/2018 1432   KETONESUR NEGATIVE 07/06/2018 1432   PROTEINUR 30 (A) 07/06/2018 1432   NITRITE NEGATIVE 07/06/2018 1432   LEUKOCYTESUR NEGATIVE 07/06/2018 1432   Sepsis Labs Recent Labs  Lab 08/16/22 0839 08/20/22 1244 08/21/22 0756 08/22/22 0430  WBC 6.6 5.8 4.5 5.6   Microbiology No results found for this or any previous visit (from the past 240 hour(s)).   Time coordinating discharge: 35 minutes  SIGNED:   Barb Merino, MD  [facility] 08/22/2022, 9:29 AM

## 2022-08-22 NOTE — TOC Transition Note (Signed)
Transition of Care [facility]) - CM/SW Discharge Note   Patient Details  Name: AAMIRAH SALMI MRN: [id number] Date of Birth: [birth year]  [facility] CM/SW Contact:  Leeroy Cha, RN Phone Number: 08/22/2022, 9:44 AM   Clinical Narrative:    [id number] discharged to return home.  Chart reviewed for TOC needs.  None found.  Patient self care.   Final next level of care: Home/Self Care Barriers to Discharge: Barriers Resolved   Patient Goals and CMS Choice Patient states their goals for this hospitalization and ongoing recovery are:: to return to my home CMS Medicare.gov Compare Post Acute Care list provided to:: Patient    Discharge Placement                       Discharge Plan and Services   Discharge Planning Services: CM Consult                                 Social Determinants of Health (SDOH) Interventions     Readmission Risk Interventions   No data to display

## 2022-08-22 NOTE — Progress Notes (Signed)
Chaplain was paged to provide assistance and support to Jocelyn Kaufman at her request.  Jocelyn Kaufman was unable to come up at that time, but called Jocelyn Kaufman by phone to provide some initial support.  Chaplain let her know that another chaplain would be stopping by later in the evening.  [address], Jocelyn Kaufman, [phone]

## 2022-08-22 NOTE — Progress Notes (Signed)
Spoke with pt about her concern regarding staff nurse, Chaplain visit, melatonin and senokot-s prescription. Pt was assigned another nurse and medications discontinued per her request. Will f/u with day nurse regarding Chaplain visit during  day time. MD made aware of pt request to discontinued medications. Verbal order received.

## 2022-08-22 NOTE — Progress Notes (Signed)
Pt given discharge instructions.  Pt belongings returned - cane, clothes, cell phone, medications from pharmacy.  Pt taken to front entrance by wheelchair. Transportation provided by sister.

## 2022-08-23 DIAGNOSIS — S76111D Strain of right quadriceps muscle, fascia and tendon, subsequent encounter: Secondary | ICD-10-CM | POA: Diagnosis not present

## 2022-08-23 DIAGNOSIS — R269 Unspecified abnormalities of gait and mobility: Secondary | ICD-10-CM | POA: Diagnosis not present

## 2022-08-23 DIAGNOSIS — M6281 Muscle weakness (generalized): Secondary | ICD-10-CM | POA: Diagnosis not present

## 2022-08-25 ENCOUNTER — Encounter: Payer: Self-pay | Admitting: Podiatry

## 2022-08-25 ENCOUNTER — Ambulatory Visit (INDEPENDENT_AMBULATORY_CARE_PROVIDER_SITE_OTHER): Payer: Medicare PPO | Admitting: Podiatry

## 2022-08-25 DIAGNOSIS — B351 Tinea unguium: Secondary | ICD-10-CM

## 2022-08-25 DIAGNOSIS — E1142 Type 2 diabetes mellitus with diabetic polyneuropathy: Secondary | ICD-10-CM | POA: Diagnosis not present

## 2022-08-25 DIAGNOSIS — L84 Corns and callosities: Secondary | ICD-10-CM | POA: Diagnosis not present

## 2022-08-25 DIAGNOSIS — M79674 Pain in right toe(s): Secondary | ICD-10-CM | POA: Diagnosis not present

## 2022-08-25 DIAGNOSIS — M79675 Pain in left toe(s): Secondary | ICD-10-CM

## 2022-08-25 NOTE — Progress Notes (Signed)
Subjective:  Patient ID: Jocelyn Kaufman, female    DOB: 1956-02-28,  MRN: [id number]  Jocelyn Kaufman presents to clinic today for at risk foot care with h/o diabetic neuropathy for painful mycotic toenails and corn right 5th digit.  Patient has been hospitalized for pulmonary embolism and is now on Eliquis. She also had a fall and suffered a tear of her right quadriceps tendon.  Chief Complaint  Patient presents with   Nail Problem    [facility]  Diabetic  BG - 101 this morning  A1C - 6.8 PCP- Dr Shelia Media October 2023  . New problem(s): None.   PCP is Deland Pretty, MD.  Allergies  Allergen Reactions   Semaglutide(0.25 Or 0.'5mg'$ -Dos) Other (See Comments)    Cataracts requiring surgery and impairing vision Other reaction(s): Not available   Shellfish-Derived Products Anaphylaxis, Swelling and Other (See Comments)    Patient avoids   Empagliflozin Itching, Swelling and Other (See Comments)    UTI sx's and yeast infections   Oxycodone-Acetaminophen     Other reaction(s): vomiting   Rho (D) Immune Globulin     Other reaction(s): Not available   Semaglutide     Other reaction(s): worsening DM RETINOPATHY   Tramadol Hcl Other (See Comments)    Other reaction(s): hallucinations   Canagliflozin Other (See Comments)    Urinary incontinence Other reaction(s): Not available, urinary incontinence   Latex Rash   Liraglutide Itching    Other reaction(s): itching all over   Minoxidil Rash    Other reaction(s): skin irritation   Omeprazole Rash    Other reaction(s): Not available   Percocet [Oxycodone-Acetaminophen] Nausea And Vomiting    Review of Systems: Negative except as noted in the HPI.  Objective:   Jocelyn Kaufman is a pleasant 66 y.o. female in NAD. AAO x 3.  Vascular Examination: CFT immediate b/l LE. Palpable DP/PT pulses b/l LE. Digital hair present b/l. Skin temperature gradient WNL b/l. No pain with calf compression b/l. No edema noted b/l. No cyanosis or clubbing noted b/l  LE.  Dermatological Examination: Pedal integument with normal turgor, texture and tone b/l LE. No open wounds b/l. No interdigital macerations b/l. Toenails 1-5 b/l elongated, thickened, discolored with subungual debris. +Tenderness with dorsal palpation of nailplates. Hyperkeratotic lesion(s) noted right fifth digit lateral nail border.  Neurological Examination: Pt has subjective symptoms of neuropathy. Protective sensation intact 5/5 intact bilaterally with 10g monofilament b/l. Vibratory sensation intact b/l.  Musculoskeletal Examination: Normal muscle strength 5/5 to all lower extremity muscle groups bilaterally. 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 No pain, crepitus or joint limitation noted with ROM b/l LE.  Patient ambulates with cane assistance.  Assessment/Plan: 1. Pain due to onychomycosis of toenails of both feet   2. Corns   3. Diabetic peripheral neuropathy associated with type 2 diabetes mellitus ([place])     No orders of the defined types were placed in this encounter.   -Patient's family member present. All questions/concerns addressed on today's visit. -Consent given for treatment as described below: -Continue Ciclopirox nail solution. -Continue diabetic foot care principles: inspect feet daily, monitor glucose as recommended by PCP and/or Endocrinologist, and follow prescribed diet per PCP, Endocrinologist and/or dietician. -Patient to continue soft, supportive shoe gear daily. -Mycotic toenails 1-5 bilaterally were debrided in length and girth with sterile nail nippers and dremel without incident. -Corn(s) right fifth digit pared utilizing sterile scalpel blade without complication or incident. Total number debrided=1. -Patient/POA to call should there be question/concern in the interim.  Return in about 3 months (around 11/25/2022).  Marzetta Board, DPM

## 2022-08-27 ENCOUNTER — Encounter: Payer: Self-pay | Admitting: Adult Health

## 2022-08-27 DIAGNOSIS — M6281 Muscle weakness (generalized): Secondary | ICD-10-CM | POA: Diagnosis not present

## 2022-08-27 DIAGNOSIS — S76111D Strain of right quadriceps muscle, fascia and tendon, subsequent encounter: Secondary | ICD-10-CM | POA: Diagnosis not present

## 2022-08-27 DIAGNOSIS — R269 Unspecified abnormalities of gait and mobility: Secondary | ICD-10-CM | POA: Diagnosis not present

## 2022-08-27 NOTE — Telephone Encounter (Signed)
Notified of message below

## 2022-08-27 NOTE — Telephone Encounter (Signed)
-----   Message from Gardenia Phlegm, NP sent at 08/27/2022  3:30 PM EDT ----- No she does not Jocelyn Kaufman thank you for asking.  Nursing--can you please call patient and let her know she does not need CT chest w/o contrast based on CT angio results? ----- Message ----- From: Michail Jewels Sent: 08/27/2022   2:51 PM EDT To: Gardenia Phlegm, NP  Hi Dr Delice Bison Pt wants to know if she still needs the CT Chest WO she had a CT Angio on the 23rd of Oct. Please advise.  Thank you Peggy

## 2022-08-30 DIAGNOSIS — R269 Unspecified abnormalities of gait and mobility: Secondary | ICD-10-CM | POA: Diagnosis not present

## 2022-08-30 DIAGNOSIS — H10413 Chronic giant papillary conjunctivitis, bilateral: Secondary | ICD-10-CM | POA: Diagnosis not present

## 2022-08-30 DIAGNOSIS — M6281 Muscle weakness (generalized): Secondary | ICD-10-CM | POA: Diagnosis not present

## 2022-08-30 DIAGNOSIS — S76111D Strain of right quadriceps muscle, fascia and tendon, subsequent encounter: Secondary | ICD-10-CM | POA: Diagnosis not present

## 2022-08-30 DIAGNOSIS — H401131 Primary open-angle glaucoma, bilateral, mild stage: Secondary | ICD-10-CM | POA: Diagnosis not present

## 2022-08-30 DIAGNOSIS — E113291 Type 2 diabetes mellitus with mild nonproliferative diabetic retinopathy without macular edema, right eye: Secondary | ICD-10-CM | POA: Diagnosis not present

## 2022-08-30 DIAGNOSIS — H04123 Dry eye syndrome of bilateral lacrimal glands: Secondary | ICD-10-CM | POA: Diagnosis not present

## 2022-08-31 ENCOUNTER — Other Ambulatory Visit (HOSPITAL_COMMUNITY): Payer: Self-pay

## 2022-08-31 ENCOUNTER — Inpatient Hospital Stay: Payer: Medicare PPO

## 2022-08-31 ENCOUNTER — Inpatient Hospital Stay: Payer: Medicare PPO | Attending: Hematology and Oncology

## 2022-08-31 ENCOUNTER — Inpatient Hospital Stay (HOSPITAL_BASED_OUTPATIENT_CLINIC_OR_DEPARTMENT_OTHER): Payer: Medicare PPO | Admitting: Physician Assistant

## 2022-08-31 ENCOUNTER — Other Ambulatory Visit: Payer: Self-pay

## 2022-08-31 VITALS — BP 154/66 | HR 61 | Temp 98.3°F | Resp 16 | Wt 240.3 lb

## 2022-08-31 DIAGNOSIS — D649 Anemia, unspecified: Secondary | ICD-10-CM

## 2022-08-31 DIAGNOSIS — D631 Anemia in chronic kidney disease: Secondary | ICD-10-CM | POA: Insufficient documentation

## 2022-08-31 DIAGNOSIS — I129 Hypertensive chronic kidney disease with stage 1 through stage 4 chronic kidney disease, or unspecified chronic kidney disease: Secondary | ICD-10-CM | POA: Diagnosis not present

## 2022-08-31 DIAGNOSIS — N183 Chronic kidney disease, stage 3 unspecified: Secondary | ICD-10-CM | POA: Diagnosis not present

## 2022-08-31 DIAGNOSIS — D638 Anemia in other chronic diseases classified elsewhere: Secondary | ICD-10-CM

## 2022-08-31 DIAGNOSIS — N1831 Chronic kidney disease, stage 3a: Secondary | ICD-10-CM | POA: Insufficient documentation

## 2022-08-31 LAB — CMP (CANCER CENTER ONLY)
ALT: 37 U/L (ref 0–44)
AST: 28 U/L (ref 15–41)
Albumin: 3.9 g/dL (ref 3.5–5.0)
Alkaline Phosphatase: 74 U/L (ref 38–126)
Anion gap: 4 — ABNORMAL LOW (ref 5–15)
BUN: 26 mg/dL — ABNORMAL HIGH (ref 8–23)
CO2: 26 mmol/L (ref 22–32)
Calcium: 9.3 mg/dL (ref 8.9–10.3)
Chloride: 110 mmol/L (ref 98–111)
Creatinine: 1.15 mg/dL — ABNORMAL HIGH (ref 0.44–1.00)
GFR, Estimated: 53 mL/min — ABNORMAL LOW (ref >60)
Glucose, Bld: 101 mg/dL — ABNORMAL HIGH (ref 70–99)
Potassium: 4.9 mmol/L (ref 3.5–5.1)
Sodium: 140 mmol/L (ref 135–145)
Total Bilirubin: 0.4 mg/dL (ref 0.3–1.2)
Total Protein: 6 g/dL — ABNORMAL LOW (ref 6.5–8.1)

## 2022-08-31 LAB — CBC WITH DIFFERENTIAL (CANCER CENTER ONLY)
Abs Immature Granulocytes: 0.09 10*3/uL — ABNORMAL HIGH (ref 0.00–0.07)
Basophils Absolute: 0 10*3/uL (ref 0.0–0.1)
Basophils Relative: 1 %
Eosinophils Absolute: 0.2 10*3/uL (ref 0.0–0.5)
Eosinophils Relative: 3 %
HCT: 32.5 % — ABNORMAL LOW (ref 36.0–46.0)
Hemoglobin: 10.1 g/dL — ABNORMAL LOW (ref 12.0–15.0)
Immature Granulocytes: 2 %
Lymphocytes Relative: 26 %
Lymphs Abs: 1.6 10*3/uL (ref 0.7–4.0)
MCH: 28.7 pg (ref 26.0–34.0)
MCHC: 31.1 g/dL (ref 30.0–36.0)
MCV: 92.3 fL (ref 80.0–100.0)
Monocytes Absolute: 0.3 10*3/uL (ref 0.1–1.0)
Monocytes Relative: 6 %
Neutro Abs: 3.9 10*3/uL (ref 1.7–7.7)
Neutrophils Relative %: 62 %
Platelet Count: 201 10*3/uL (ref 150–400)
RBC: 3.52 MIL/uL — ABNORMAL LOW (ref 3.87–5.11)
RDW: 14.3 % (ref 11.5–15.5)
WBC Count: 6.1 10*3/uL (ref 4.0–10.5)
nRBC: 0 % (ref 0.0–0.2)

## 2022-08-31 LAB — RETIC PANEL
Immature Retic Fract: 21.6 % — ABNORMAL HIGH (ref 2.3–15.9)
RBC.: 3.52 MIL/uL — ABNORMAL LOW (ref 3.87–5.11)
Retic Count, Absolute: 46.1 10*3/uL (ref 19.0–186.0)
Retic Ct Pct: 1.3 % (ref 0.4–3.1)
Reticulocyte Hemoglobin: 30.6 pg (ref >27.9)

## 2022-08-31 LAB — IRON AND IRON BINDING CAPACITY (CC-WL,HP ONLY)
Iron: 126 ug/dL (ref 28–170)
Saturation Ratios: 44 % — ABNORMAL HIGH (ref 10.4–31.8)
TIBC: 287 ug/dL (ref 250–450)
UIBC: 161 ug/dL (ref 148–442)

## 2022-08-31 LAB — VITAMIN B12: Vitamin B-12: 1414 pg/mL — ABNORMAL HIGH (ref 180–914)

## 2022-08-31 LAB — FERRITIN: Ferritin: 78 ng/mL (ref 11–307)

## 2022-08-31 MED ORDER — EPOETIN ALFA-EPBX 40000 UNIT/ML IJ SOLN
40000.0000 [IU] | Freq: Once | INTRAMUSCULAR | Status: AC
  Administered 2022-08-31: 40000 [IU] via SUBCUTANEOUS
  Filled 2022-08-31: qty 1

## 2022-08-31 MED ORDER — HYDROCODONE-ACETAMINOPHEN 5-325 MG PO TABS
1.0000 | ORAL_TABLET | Freq: Four times a day (QID) | ORAL | 0 refills | Status: DC | PRN
  Filled 2022-08-31: qty 15, 4d supply, fill #0

## 2022-08-31 MED ORDER — EPOETIN ALFA-EPBX 40000 UNIT/ML IJ SOLN: 40000.0000 [IU] | Freq: Once | INTRAMUSCULAR | Status: DC

## 2022-08-31 NOTE — Progress Notes (Signed)
[facility] Follow up:    Deland Pretty, MD [place] [id number]  DIAGNOSIS: Anemia and chronic kidney disease  CURRENT THERAPY: Retacrit  INTERVAL HISTORY: Jocelyn Kaufman 66 y.o. female returns for follow-up of her anemia chronic disease. She was last seen by Wilber Bihari, NP on 08/16/2022. In the interim, she was hospitalized from 08/20/2022-08/22/2022 after being diagnosed with  small subsegmental pulmonary embolism likely provoked due to immobilization of the right leg for 6 weeks. She was started on Eliquis therapy.   Jocelyn Kaufman is frustrated today as she doesn't feel well. She adds that since switching from aranesp to retacrit, her energy levels have declined. She continues to struggle with her mobility due to right leg weakness and pain. Her chest pains have improved since starting Eliquis therapy but she still has period episodes of sharp pain. She takes Norco 5-325 mg as needed with improvement of chest pain. She denies nausea, vomiting or abdominal pain. Her bowel habits are unchanged. She denies easy bruising or signs of active bleeding. She has no other complaints.    Patient Active Problem List   Diagnosis Date Noted   Pulmonary embolus ([place]) 08/20/2022   Acquired trigger finger 05/26/2022   Abnormal reflex 02/20/2022   Anxiety 02/20/2022   Body mass index (BMI) 40.0-44.9, adult ([place]) 02/20/2022   Breast tenderness 02/20/2022   Carotid artery occlusion 02/20/2022   Cataract 02/20/2022   Chronic kidney disease, stage 3a ([place]) 02/20/2022   Diabetic renal disease ([place]) 02/20/2022   Diabetic retinopathy associated with type 2 diabetes mellitus ([place]) 02/20/2022   Encounter for general adult medical examination without abnormal findings 02/20/2022   Family history of malignant neoplasm of digestive organs 02/20/2022   Fibromyalgia 02/20/2022   Generalized anxiety disorder 02/20/2022   History of musculoskeletal disease  02/20/2022   Hypothyroid 02/20/2022   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[place]) 02/20/2022   Morbid obesity ([place]) 02/20/2022   Mild intermittent asthma 02/20/2022   Myalgia 02/20/2022   Neuropathy of both feet 02/20/2022   Non-toxic nodular goiter 02/20/2022   Osteoarthritis of knee 02/20/2022   Obstructive sleep apnea syndrome 02/20/2022   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[place]) 02/20/2022   Paroxysmal ventricular tachycardia ([place]) 02/20/2022   Postmenopausal state 02/20/2022   Personal history of transient ischemic attack (TIA), 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 deficits 02/20/2022   Recurrent major depression in full remission ([place]) 02/20/2022   Renal failure syndrome 02/20/2022   Sciatica 02/20/2022   Vitamin D deficiency 02/20/2022   Bilateral hearing loss 08/29/2020   Dysequilibrium 08/29/2020   Anemia in chronic kidney disease 06/17/2020   Anemia of chronic disease 04/25/2020   Impingement syndrome of right shoulder region 11/25/2018   Osteoarthritis of right shoulder 11/03/2018   Pain in joint of right shoulder 11/03/2018   Fall at home, initial encounter 07/06/2018   Displaced spiral fracture of shaft of humerus, right arm, initial encounter for closed fracture 07/06/2018   TIA (transient ischemic attack) 07/06/2018   Allergic rhinitis 07/04/2018   Normochromic normocytic anemia 01/20/2018   Impingement syndrome of left shoulder 12/17/2017   Rheumatoid arthritis ([place]) 04/30/2017   Lesion of left humerus 04/29/2017   Adrenal adenoma 03/19/2017   Neuropathy 03/19/2017   Spinal stenosis 03/19/2017   Pericardial effusion 03/14/2017   Diabetes mellitus ([place]) 03/14/2017   Essential hypertension 03/14/2017   Mixed hyperlipidemia 03/14/2017   Ulnar neuropathy at wrist, 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   Meralgia paresthetica of right side 01/10/2017  History of Cushing disease 03/01/2016   Allergic rhinoconjunctivitis 07/09/2015   Asthma, well controlled 07/09/2015    Laryngopharyngeal reflux (LPR) 07/09/2015    is allergic to semaglutide(0.25 or 0.'5mg'$ -dos), shellfish-derived products, empagliflozin, oxycodone-acetaminophen, rho (d) immune globulin, semaglutide, tramadol hcl, canagliflozin, latex, liraglutide, minoxidil, omeprazole, and percocet [oxycodone-acetaminophen].  MEDICAL HISTORY: Past Medical History:  Diagnosis Date   Adrenal adenoma    Anemia    Angio-edema    Asthma    Atrial fibrillation ([place])    Cushing disease ([place])    Depression    Diabetes mellitus without complication (HCC)    Fibromyalgia    Hyperlipidemia    Hypertension    Hypothyroidism    Meralgia paresthetica of right side 01/10/2017   Migraine    Pericardial effusion    Proteinuria    Rheumatoid arthritis (HCC)    Ulnar neuropathy at wrist, 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   Urticaria     SURGICAL HISTORY: Past Surgical History:  Procedure Laterality Date   ADENOIDECTOMY     APPENDECTOMY     CYST REMOVAL LEG Right    Removed from right thigh joint   ORIF HUMERUS FRACTURE Right 07/09/2018   Procedure: OPEN REDUCTION INTERNAL FIXATION (ORIF) HUMERAL SHAFT FRACTURE;  Surgeon: Hiram Gash, MD;  Location: [facility];  Service: Orthopedics;  Laterality: Right;   TONSILLECTOMY     TOTAL VAGINAL HYSTERECTOMY     UMBILICAL HERNIA REPAIR      SOCIAL HISTORY: Social History   Socioeconomic History   Marital status: Single    Spouse name: Not on file   Number of children: 0   Years of education: Masters   Highest education level: Not on file  Occupational History   Occupation: [employer]  Tobacco Use   Smoking status: Never   Smokeless tobacco: Never  Substance and Sexual Activity   Alcohol use: Not Currently    Comment: occ   Drug use: No   Sexual activity: Never  Other Topics Concern   Not on file  Social History Narrative   Lives alone   Caffeine use: 2 cups coffee per day   Tea sometimes   Soda- 3 x/week   Right-handed   Retired.    Masters Degree    No Children   Single.   Social Determinants of Health   Financial Resource Strain: Not on file  Food Insecurity: No Food Insecurity (08/21/2022)   Hunger Vital Sign    Worried About Running Out of Food in the Last Year: Never true    Ran Out of Food in the Last Year: Never true  Transportation Needs: No Transportation Needs (08/21/2022)   PRAPARE - Hydrologist (Medical): No    Lack of Transportation (Non-Medical): No  Physical Activity: Not on file  Stress: Not on file  Social Connections: Not on file  Intimate Partner Violence: Not At Risk (08/21/2022)   Humiliation, Afraid, Rape, and Kick questionnaire    Fear of Current or Ex-Partner: No    Emotionally Abused: No    Physically Abused: No    Sexually Abused: No    FAMILY HISTORY: Family History  Problem Relation Age of Onset   Hypertension Mother    Stroke Mother    Kidney failure Mother    Heart disease Mother        heart murmur   Glaucoma Mother    Stroke Father    COPD Father    Cancer - Prostate Father  Colon cancer Father    Diabetes Sister    Thyroid cancer Sister    Epilepsy Sister    Ovarian cancer Paternal Aunt     REVIEW OF SYSTEMS:   Constitutional: Negative for appetite change, chills, fever and unexpected weight change. +fatigue HENT: Negative for mouth sores, nosebleeds, sore throat and trouble swallowing.   Eyes: Negative for eye problems and icterus.  Respiratory: Negative for cough, hemoptysis, shortness of breath and wheezing.   Cardiovascular: +Improving but intermittent chest pain.  Gastrointestinal: Negative for abdominal pain, constipation, diarrhea, nausea and vomiting.  Genitourinary: Negative for bladder incontinence, difficulty urinating, dysuria, frequency and hematuria.   Musculoskeletal: Negative for back pain, gait problem, neck pain and neck stiffness. +right leg pain Skin:Negative for rash and ulcers Neurological: Negative for dizziness,gait  problem, headaches, light-headedness and seizures. +right leg weakness Hematological: Negative for adenopathy. Does not bruise/bleed easily.  Psychiatric/Behavioral: Negative for confusion, depression and sleep disturbance. The patient is not nervous/anxious.     PHYSICAL EXAMINATION  ECOG PERFORMANCE STATUS: 2 - Symptomatic, <50% confined to bed  Vitals:   08/31/22 1410 08/31/22 1438  BP: (!) 154/66   Pulse: 61   Resp: 16   Temp: 100.3 F (37.9 C) 98.3 F (36.8 C)  SpO2: 100%     Physical Exam Constitutional:      General: She is not in acute distress.    Appearance: Normal appearance. She is not toxic-appearing.  HENT:     Head: Normocephalic and atraumatic.  Eyes:     General: No scleral icterus. Cardiovascular:     Rate and Rhythm: Normal rate and regular rhythm.     Pulses: Normal pulses.     Heart sounds: Normal heart sounds.  Pulmonary:     Effort: Pulmonary effort is normal.     Breath sounds: Normal breath sounds.  Musculoskeletal:     Cervical back: Neck supple.     Right lower leg: Edema present.  Lymphadenopathy:     Cervical: No cervical adenopathy.  Skin:    General: Skin is warm and dry.     Findings: No rash.  Neurological:     General: No focal deficit present.     Mental Status: She is alert.  Psychiatric:        Mood and Affect: Mood normal.        Behavior: Behavior normal.     LABORATORY DATA:  CBC    Component Value Date/Time   WBC 6.1 08/31/2022 1317   WBC 5.6 08/22/2022 0430   RBC 3.52 (L) 08/31/2022 1317   RBC 3.52 (L) 08/31/2022 1317   HGB 10.1 (L) 08/31/2022 1317   HCT 32.5 (L) 08/31/2022 1317   PLT 201 08/31/2022 1317   MCV 92.3 08/31/2022 1317   MCH 28.7 08/31/2022 1317   MCHC 31.1 08/31/2022 1317   RDW 14.3 08/31/2022 1317   LYMPHSABS 1.6 08/31/2022 1317   MONOABS 0.3 08/31/2022 1317   EOSABS 0.2 08/31/2022 1317   BASOSABS 0.0 08/31/2022 1317      ASSESSMENT and THERAPY PLAN:  Jocelyn Kaufman is a 66 y.o. female  who presents to the clinic for a follow up of anemia of chronic disease.   #Anemia of chronic disease: --Workup included SPEP without M protein detected, no evidence of nutritional deficiencies, no evidence of hemolysis.  --Patient started Aranesp 300 mcg started on 05/06/2020. Changed to Retacrit 40,000 units q 2 weeks on 07/24/2021 based on insurance preference.  --Recommend retacrit injection if Hgb is less  than 10.5 --Labs today were reviewed. Hgb is 10.1, MCV 92.3. No evidence of vitamin B12 or iron deficiency. --Proceed with retacrit injection today. We will check with authorization team if she can go back to aranesp as that is patient's preference.  --RTC in 2 weeks with labs, f/u visit with Dr. Lindi Adie +/- Retacrit injection  #Pulmonary embolism: --Diagnosed on 08/20/2022 after presenting with ongoing right sided chest wall pain.  --CTA chest showed small subsegmental pulmonary embolism likely precipitated due to immobilization of the right leg for 6 weeks.  Duplex is negative for DVT.    --Started on Eliquis and recommended 3 months of therapy due to provoked PE.  --Currently takes Norco 5-325 mg PRN for breakthrough chest pain. Sent refill today.   All questions were answered. The patient knows to call the clinic with any problems, questions or concerns. We can certainly see the patient much sooner if necessary.  I have spent a total of 30 minutes minutes of face-to-face and non-face-to-face time, preparing to see the patient, performing a medically appropriate examination, counseling and educating the patient, ordering medications/tests, documenting clinical information in the electronic health record, and care coordination.   Dede Query PA-C Dept of Hematology and [place] at [facility] Phone: [phone]

## 2022-09-03 DIAGNOSIS — F3342 Major depressive disorder, recurrent, in full remission: Secondary | ICD-10-CM | POA: Diagnosis not present

## 2022-09-03 DIAGNOSIS — T148XXA Other injury of unspecified body region, initial encounter: Secondary | ICD-10-CM | POA: Diagnosis not present

## 2022-09-03 DIAGNOSIS — R2689 Other abnormalities of gait and mobility: Secondary | ICD-10-CM | POA: Diagnosis not present

## 2022-09-03 DIAGNOSIS — F419 Anxiety disorder, unspecified: Secondary | ICD-10-CM | POA: Diagnosis not present

## 2022-09-03 DIAGNOSIS — I2693 Single subsegmental pulmonary embolism without acute cor pulmonale: Secondary | ICD-10-CM | POA: Diagnosis not present

## 2022-09-03 DIAGNOSIS — M0579 Rheumatoid arthritis with rheumatoid factor of multiple sites without organ or systems involvement: Secondary | ICD-10-CM | POA: Diagnosis not present

## 2022-09-04 DIAGNOSIS — R269 Unspecified abnormalities of gait and mobility: Secondary | ICD-10-CM | POA: Diagnosis not present

## 2022-09-04 DIAGNOSIS — S76111D Strain of right quadriceps muscle, fascia and tendon, subsequent encounter: Secondary | ICD-10-CM | POA: Diagnosis not present

## 2022-09-04 DIAGNOSIS — M6281 Muscle weakness (generalized): Secondary | ICD-10-CM | POA: Diagnosis not present

## 2022-09-06 DIAGNOSIS — M6281 Muscle weakness (generalized): Secondary | ICD-10-CM | POA: Diagnosis not present

## 2022-09-06 DIAGNOSIS — S76111D Strain of right quadriceps muscle, fascia and tendon, subsequent encounter: Secondary | ICD-10-CM | POA: Diagnosis not present

## 2022-09-06 DIAGNOSIS — R269 Unspecified abnormalities of gait and mobility: Secondary | ICD-10-CM | POA: Diagnosis not present

## 2022-09-07 DIAGNOSIS — S76111D Strain of right quadriceps muscle, fascia and tendon, subsequent encounter: Secondary | ICD-10-CM | POA: Diagnosis not present

## 2022-09-07 DIAGNOSIS — M25561 Pain in right knee: Secondary | ICD-10-CM | POA: Diagnosis not present

## 2022-09-10 DIAGNOSIS — Z79899 Other long term (current) drug therapy: Secondary | ICD-10-CM | POA: Diagnosis not present

## 2022-09-10 DIAGNOSIS — M25569 Pain in unspecified knee: Secondary | ICD-10-CM | POA: Diagnosis not present

## 2022-09-10 DIAGNOSIS — M79643 Pain in unspecified hand: Secondary | ICD-10-CM | POA: Diagnosis not present

## 2022-09-10 DIAGNOSIS — M0579 Rheumatoid arthritis with rheumatoid factor of multiple sites without organ or systems involvement: Secondary | ICD-10-CM | POA: Diagnosis not present

## 2022-09-10 DIAGNOSIS — M797 Fibromyalgia: Secondary | ICD-10-CM | POA: Diagnosis not present

## 2022-09-10 DIAGNOSIS — E1169 Type 2 diabetes mellitus with other specified complication: Secondary | ICD-10-CM | POA: Diagnosis not present

## 2022-09-10 DIAGNOSIS — M1990 Unspecified osteoarthritis, unspecified site: Secondary | ICD-10-CM | POA: Diagnosis not present

## 2022-09-10 DIAGNOSIS — D649 Anemia, unspecified: Secondary | ICD-10-CM | POA: Diagnosis not present

## 2022-09-10 DIAGNOSIS — M1711 Unilateral primary osteoarthritis, right knee: Secondary | ICD-10-CM | POA: Diagnosis not present

## 2022-09-11 DIAGNOSIS — M25561 Pain in right knee: Secondary | ICD-10-CM | POA: Diagnosis not present

## 2022-09-11 DIAGNOSIS — E113512 Type 2 diabetes mellitus with proliferative diabetic retinopathy with macular edema, left eye: Secondary | ICD-10-CM | POA: Diagnosis not present

## 2022-09-14 ENCOUNTER — Telehealth: Payer: Self-pay

## 2022-09-14 NOTE — Telephone Encounter (Signed)
Call to pt reference next PREP class.  Will need to have another surgery so the 09/25/22 class will not work.  Advised will call her about Feb of 2024 to check on healing and readiness to start PREP

## 2022-09-17 ENCOUNTER — Encounter: Payer: Self-pay | Admitting: Neurology

## 2022-09-17 ENCOUNTER — Ambulatory Visit: Payer: Medicare PPO | Admitting: Neurology

## 2022-09-17 VITALS — BP 150/76 | HR 70 | Ht 64.0 in | Wt 247.6 lb

## 2022-09-17 DIAGNOSIS — R269 Unspecified abnormalities of gait and mobility: Secondary | ICD-10-CM

## 2022-09-17 DIAGNOSIS — R296 Repeated falls: Secondary | ICD-10-CM | POA: Diagnosis not present

## 2022-09-17 DIAGNOSIS — M48061 Spinal stenosis, lumbar region without neurogenic claudication: Secondary | ICD-10-CM

## 2022-09-17 DIAGNOSIS — R2689 Other abnormalities of gait and mobility: Secondary | ICD-10-CM

## 2022-09-17 NOTE — Progress Notes (Signed)
Subjective:    Patient ID: Jocelyn Kaufman is a 66 y.o. female.  HPI    Interim history:   Dear Dr. Shelia Media,  I saw your patient, Jocelyn Kaufman, upon your kind request in my neurologic clinic today for initial consultation of her gait disorder and recurrent falls.  The patient is unaccompanied today.  As you know, Jocelyn Kaufman is a 66 year old female with an underlying complex medical history hypertension, hyperlipidemia, hypothyroidism, migraine headaches, rheumatoid arthritis, asthma, history of angioedema, adrenal adenoma, anemia, Cushing's disease, atrial fibrillation, depression, diabetes, fibromyalgia, meralgia paresthetica, ulnar neuropathy left upper extremity, history of PE, OSA on PAP therapy, and severe obesity with a BMI of over 40, who reports having fallen 5 times since late July 2023.  I reviewed your office records including office visit note from 09/03/2022. She reported several falls in the past few months.  She felt that her balance was off.  I have recently see her for obstructive sleep apnea.  Her first fall in late July happened outside her home, on the back stairs, she fell off 1 or 2 steps, hurt her knee at the time, was on the ground for 20 to 30 minutes before someone could help her.  She did not lose consciousness, she did not hit her head at the time.  She denies any sudden onset of one-sided weakness or numbness or tingling or droopy face or slurring of speech, reports a diagnosis of TIA in 2018.  She has had no severe or recurrent headaches.  Please note, that the patient seemed frustrated especially in the first part of the visit, as I was taking her history. She looked away, rolled her eyes, she also paused for longer periods of time looking away, she clearly seemed frustrated and gave short answers, sighing and puffing.  Since I sensed that she was unhappy for some reasonable frustrated, I offered her a cancellation of this appointment and the possibility of getting evaluated for  her balance issues and falls by another provider, she declined. She clarified that she was not upset or frustrated, she did become tearful during this office visit, she wanted to proceed and maintain this appointment and further work-up through my care. I stepped out of the room and spoke separately to the intake CMA, who had triaged the patient.  Again, the patient elected to proceed with the visit and declined my offer to cancel his appointment.   She had surgery to the right quadriceps tendon in late July 2023.  She fell 4 times since then.  She graduated from a walker to a cane and has completed physical therapy but stopped it after she was not making progress.  She was advised by her physical therapist to go back to using her walker until she felt stable.  She has fallen since using the cane.  She reports no lightheadedness, no warning, no vertigo type sensation, no one-sided weakness.  She does feel weaker in her legs.  She has pain in both knees, not currently wearing any braces any longer, the right knee brace did not fit after the surgery and was uncomfortable.  She is scheduled for additional surgery next week.  She was hospitalized in October with chest pain and was found to have a small PE.  I reviewed the hospital records.  She presented to the emergency room on 05/26/2022 after a fall.  I reviewed the emergency room records.  She reported that she lost her footing going down 2 steps in her backyard.  She had a CT scan of the right knee without contrast on 05/26/2022 and I reviewed the results: IMPRESSION: 1. Findings suggestive of a distal quadriceps tendon tear with small mildly displaced fracture fragments arising from the superior patella. 2. Soft tissue swelling at the anterior aspect of the knee with ill-defined fluid.  She had a CT scan of the lumbar spine without contrast on 05/26/2022 and I reviewed the results:   IMPRESSION: 1. No acute abnormality. 2. 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 3. Progressive moderate foraminal narrowing bilaterally at L3-4, L4-5 and L5-S1. 4. Stable slight retrolisthesis at L4-5 is stable.   She hydrates reasonably well with water, estimates that she drinks about 6 cups of water per day.  She does indicate frequent urination.  She has previously seen Dr. Jannifer Franklin in this office for paresthesias. She had blood work through your office on 08/06/2022 and I reviewed the results: CBC with differential and platelets showed hemoglobin below normal at 10, hematocrit below normal at 32.5, RBC below normal at 3.57.  CRP was normal at 0.07, CMP showed normal sodium at 140, potassium normal at 4.5, glucose elevated at 135, BUN mildly elevated at 27, creatinine normal at 0.98, alk phos normal at 69, ALT 36, AST normal at 26.  ESR was normal at 5.  She had blood work through your office on 06/15/2022 and I reviewed the results: Hepatitis B core antibody is nonreactive, hepatitis B surface antigen, and reactive, hepatitis C antibody IgG nonreactive.  She had a brain MRI and MR angiogram of the head without contrast through [facility] on 07/06/2018 and I reviewed the results: IMPRESSION: 1. No acute intracranial abnormality. Normal appearance of the brain for age. 2. 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 Otherwise normal intracranial MRA.    Previously:   03/29/22 (SA): 66 year old right-handed woman with an underlying medical history of diabetes, Cushing's disease, hypothyroidism, hyperlipidemia, hypertension, depression, meralgia paresthetica, migraines, glaucoma, history of ulnar neuropathy, history of pericardial effusion, and severe obesity with a BMI of over 40, who reports having noticed an increase in her apnea scores on the ResMed app.  She has been consistent with her CPAP usage.  She uses nasal pillows with good success but her headgear  has changed it does not sit as snugly as the previous style.  She has been up-to-date with her supplies.  She is still benefiting from treatment and has had some weight fluctuation over time.  She generally goes to bed anywhere between 9 PM and midnight and rise time is around 7 AM.  She drinks caffeine in the form of coffee, about 2 cups/day.  She is single and lives alone.  She has nocturia about once per average night.  She is currently on an antibiotic for a wound on her left leg.  She is followed by wound care.    She was diagnosed with obstructive sleep apnea many years ago originally.  I had evaluated her in 2018 for sleep apnea but she has not seen a sleep clinic since 2019.  I reviewed your office note from 01/22/2022.  She had a baseline sleep study through our sleep lab on 03/17/2017 which showed an AHI of 15.1/h, REM AHI of 34.8/h, supine AHI of 27.4/h, O2 nadir of 85%.  Her BMI was 37.6 at the time.  She had a subsequent titration study on 04/02/2017, at which time she did well on CPAP of 7 cm.  I reviewed her current CPAP compliance data from 03/17/2022, which is a total of 30 days, during which time she used her machine 29 days with percent use days greater than 4 hours at 97%, indicating excellent compliance with an average usage of 6 hours and 35 minutes, residual AHI elevated at 10.4/h, leak acceptable with the 95th percentile at 14.7 L/min on a pressure of 10 cm with EPR of 3. Her Epworth sleepiness score is 16 of 24, fatigue severity score is 46 out of 63.   Previously:    10/10/18 (Dr. Jannifer Franklin): <<Jocelyn Kaufman is a 66 year old right-handed black female with a history of diabetes, dyslipidemia, and hypertension.  The patient was admitted to the hospital on 06 July 2018.  The patient began having symptoms the day before with dizziness, she was dizzy all day long and did not feel well.  The patient continued to have some dizziness the next day, at one point she had stood up to get a drink of water  because she felt hot and then she fell over and fractured the humerus on the right.  From that point on, she began having a stuttering speech problem, word finding issues that persisted for 6 or 7 hours and then finally cleared.  The patient had some right-sided numbness and weakness, she went to the hospital and was admitted for a stroke evaluation.  MRI of the brain was done and did not show evidence of a stroke.  The dizziness problem persisted for 3 or 4 weeks, and then was intermittent from that point but has gone away at this time.  The patient had MRA of the head that showed atherosclerotic changes in the carotid siphon on the right, carotid Doppler study was unremarkable, 2D echocardiogram was unremarkable.  The patient was placed on aspirin, her LDL was elevated at 126.  The patient has since been started on Lipitor.  She comes to this office for an evaluation.  She checks her blood pressures at home, they generally run in the 202-334 range systolic.  The patient indicates that she has had dizziness in the past that has resolved, the last such event was in April 2018.>>   12/25/17 (MM): << Jocelyn Kaufman is a 66 year old female with a history of obstructive sleep apnea on CPAP.  She returns today for follow-up.  Her CPAP download indicates that she use her machine 30 out of 30 days for compliance of 100%.  She use her machine greater than 4 hours 29 out of 30 days for compliance of 97%.  On average she uses her machine 7 hours and 49 minutes.  Her residual AHI is 5.8 on 8 cm of water with EPR 3.  Her leak in the 95th percentile is 10.4 L/min.  She states that she has a hard time keeping her mask in place at night.  Reports that she may be interested in trying a new mask if possible.  She also notes that she has had weight fluctuations since December.  Reports that this could be a result of her Cushing syndrome.  She returns today for evaluation.>>   06/24/17 (SA): 66 year old right-handed woman with an  underlying medical history of Cushing's disease, depression, diabetes with suboptimal control, hyperlipidemia, hypertension, migraines, meralgia paresthetica on the right side, and paresthesias, and obesity, who presents for follow-up consultation of her obstructive sleep apnea, after recent sleep study testing. The patient is unaccompanied today. I first met her on 03/07/2017 at the request of her primary care physician, at  which time she reported snoring and excessive daytime somnolence. She had a prior diagnosis of OSA with sleep study testing more than 10 years prior and had not been using CPAP for at least 8 years. I suggested we proceed with a sleep study. She had a baseline sleep study, followed by a CPAP titration study. I went over her test results with her in detail today. Baseline sleep study from 03/17/2017 showed a sleep efficiency of 78%, sleep latency of 58 minutes, REM latency was 115 minutes. She had an increased percentage of stage I sleep, slow-wave sleep was 10.3% and REM sleep was mildly increased at 28.6%. She had a total AHI of 15.1 per hour, REM AHI of 34.8 per hour, supine AHI of 27.4 per hour. Average oxygen saturation was 98%, nadir was 85%. She had no significant PLMS or EKG or EEG changes. Based on her test results I suggested she return for a full night CPAP titration study. She had this on 04/02/2017. Sleep efficiency was 78.9%, sleep latency 17.5 minutes, REM latency 202 minutes. She was fitted with medium nasal pillows and CPAP was titrated from 5 cm to 7 cm. On the final pressure her AHI was 0 per hour with supine non-REM sleep achieved an O2 nadir of 90%. She had an increased percentage of stage II sleep, slow-wave sleep was 13.8% and REM sleep was 16%. She had no significant PLMS. Based on her test results I prescribed CPAP therapy for home use at 7 cm.   I reviewed her CPAP compliance data from 05/24/2017 through 06/22/2017, which is a total of 30 days, during which time she  used her machine 29 days with percent used days greater than 4 hours at 93%, indicating excellent compliance with an average usage of 7 hours and 32 minutes, residual AHI borderline at 5.9 per hour, leaked low with the 95th percentile at 4.2 L/m on a pressure of 7 cm with EPR of 3. Residual AHI appears to be primarily obstructive in nature. She reports doing fairly well with her CPAP, she does notice some improvement in her sleep quality. She has had interim weight gain. At the time of her sleep study she weighed about 222 and current weight is about 232 pounds. She does report increase in weight, particularly secondary to stress and also not being able to exercise very much. She injured her left foot recently, second toe. She does notice an improvement in her sleep consolidation and daytime sluggishness, feels less "foggy headed".      03/07/2017 (SA): (She) reports snoring and excessive daytime somnolence. I reviewed your office note from 02/27/2017, which you kindly included. Her Epworth Sleepiness Scale score is 9 out of 24, fatigue score is 61 out of 63. She reports a prior diagnosis of obstructive sleep apnea and had sleep study testing over 10 years ago. She has not been using CPAP for several years, perhaps for the past 8 years. She is a nonsmoker and drinks alcohol rarely. She drinks caffeine in the form of coffee about 1 12 oz mugs per day. She is single, has no children, she lives alone. She is on medical leave currently.  She has a bedtime of 9-11 PM and WT is 6-8 AM. She denies RLS symptoms, but has numbness in the feet. She reports a family history of sleep apnea and one of her half-sisters and a nephew. She has nocturia about twice per average night and occasional morning headaches. She has her old CPAP machine but could no  longer get supplies and her DME company went out of business.       Her Past Medical History Is Significant For: Past Medical History:  Diagnosis Date   Adrenal adenoma     Anemia    Angio-edema    Asthma    Atrial fibrillation ([place])    Cushing disease ([place])    Depression    Diabetes mellitus without complication (HCC)    Fibromyalgia    Hyperlipidemia    Hypertension    Hypothyroidism    Meralgia paresthetica of right side 01/10/2017   Migraine    Pericardial effusion    Proteinuria    Rheumatoid arthritis (HCC)    Ulnar neuropathy at wrist, 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   Urticaria     Her Past Surgical History Is Significant For: Past Surgical History:  Procedure Laterality Date   ADENOIDECTOMY     APPENDECTOMY     CYST REMOVAL LEG Right    Removed from right thigh joint   ORIF HUMERUS FRACTURE Right 07/09/2018   Procedure: OPEN REDUCTION INTERNAL FIXATION (ORIF) HUMERAL SHAFT FRACTURE;  Surgeon: Hiram Gash, MD;  Location: [facility];  Service: Orthopedics;  Laterality: Right;   TONSILLECTOMY     torn right knee tendon repair Right    2023   TOTAL VAGINAL HYSTERECTOMY     UMBILICAL HERNIA REPAIR      Her Family History Is Significant For: Family History  Problem Relation Age of Onset   Hypertension Mother    Stroke Mother    Kidney failure Mother    Heart disease Mother        heart murmur   Glaucoma Mother    Stroke Father    COPD Father    Cancer - Prostate Father    Colon cancer Father    Diabetes Sister    Thyroid cancer Sister    Epilepsy Sister    Ovarian cancer Paternal Aunt     Her Social History Is Significant For: Social History   Socioeconomic History   Marital status: Single    Spouse name: Not on file   Number of children: 0   Years of education: Masters   Highest education level: Not on file  Occupational History   Occupation: [employer]  Tobacco Use   Smoking status: Never   Smokeless tobacco: Never  Substance and Sexual Activity   Alcohol use: Not Currently    Comment: occ   Drug use: No   Sexual activity: Never  Other Topics Concern   Not on file  Social History Narrative   Lives alone    Caffeine use: 2 cups coffee per day   Tea sometimes   Soda- 3 x/week   Right-handed   Retired.    Masters Degree   No Children   Single.   Social Determinants of Health   Financial Resource Strain: Not on file  Food Insecurity: No Food Insecurity (08/21/2022)   Hunger Vital Sign    Worried About Running Out of Food in the Last Year: Never true    Ran Out of Food in the Last Year: Never true  Transportation Needs: No Transportation Needs (08/21/2022)   PRAPARE - Hydrologist (Medical): No    Lack of Transportation (Non-Medical): No  Physical Activity: Not on file  Stress: Not on file  Social Connections: Not on file    Her Allergies Are:  Allergies  Allergen Reactions   Semaglutide(0.25 Or 0.29m-Dos) Other (See Comments)  Cataracts requiring surgery and impairing vision Other reaction(s): Not available   Shellfish-Derived Products Anaphylaxis, Swelling and Other (See Comments)    Patient avoids   Empagliflozin Itching, Swelling and Other (See Comments)    UTI sx's and yeast infections   Oxycodone-Acetaminophen     Other reaction(s): vomiting   Rho (D) Immune Globulin     Other reaction(s): Not available   Semaglutide     Other reaction(s): worsening DM RETINOPATHY   Tramadol Hcl Other (See Comments)    Other reaction(s): hallucinations   Canagliflozin Other (See Comments)    Urinary incontinence Other reaction(s): Not available, urinary incontinence   Latex Rash   Liraglutide Itching    Other reaction(s): itching all over   Minoxidil Rash    Other reaction(s): skin irritation   Omeprazole Rash    Other reaction(s): Not available   Percocet [Oxycodone-Acetaminophen] Nausea And Vomiting  :   Her Current Medications Are:  Outpatient Encounter Medications as of 09/17/2022  Medication Sig   Accu-Chek Softclix Lancets lancets use to check blood sugar   acetaminophen (TYLENOL) 500 MG tablet Take 1,000 mg by mouth every 6 (six) hours as  needed for mild pain or headache.   Aflibercept (EYLEA) 2 MG/0.05ML SOLN 2 mg by Intravitreal route every 3 (three) months.   albuterol (VENTOLIN HFA) 108 (90 Base) MCG/ACT inhaler Inhale two puffs every four to six hours as needed for cough or wheeze. (Patient taking differently: Inhale 2 puffs into the lungs See admin instructions. Inhale 2 puffs into the lungs every 4-6 hours as needed for coughing or wheezing)   amLODipine (NORVASC) 10 MG tablet Take 1 tablet (10 mg total) by mouth daily.   APIXABAN (ELIQUIS) VTE STARTER PACK (10MG AND 5MG) Take as directed on package: start with two-71m tablets twice daily for 7 days. On day 8, switch to one-543mtablet twice daily.   atorvastatin (LIPITOR) 40 MG tablet Take 1 tablet (40 mg total) by mouth daily at 6 PM. (Patient taking differently: Take 40 mg by mouth daily.)   B-D ULTRAFINE III SHORT PEN 31G X 8 MM MISC SMARTSIG:1 Each SUB-Q Daily   Beclomethasone Dipropionate (QVAR IN) Inhale 2 puffs into the lungs 3 (three) times daily as needed.   CHELATED IRON PO Take 18 mg by mouth daily.   Cholecalciferol (VITAMIN D3) 125 MCG (5000 UT) CAPS Take 5,000 Units by mouth daily.   ciclopirox (PENLAC) 8 % solution Apply topically at bedtime. Apply over nail and surrounding skin. Apply daily over previous coat. After seven (7) days, may remove with alcohol and continue cycle.   Dulaglutide (TRULICITY) 1.5 MGXM/4.6OEOPN Inject 1.5 mg into the skin every Tuesday.   EPINEPHrine (EPIPEN 2-PAK) 0.3 mg/0.3 mL IJ SOAJ injection Use as directed for life-threatening allergic reaction.   epoetin alfa-epbx (RETACRIT) 2000 UNIT/ML injection See admin instructions.   escitalopram (LEXAPRO) 20 MG tablet Take 20 mg by mouth daily.   ezetimibe (ZETIA) 10 MG tablet Take 1 tablet (10 mg total) by mouth daily.   fexofenadine (ALLEGRA ALLERGY) 180 MG tablet Take 1 tablet (180 mg total) by mouth daily.   folic acid (FOLVITE) 1 MG tablet Take 2 mg by mouth daily.   hydrALAZINE  (APRESOLINE) 50 MG tablet Take 50 mg by mouth 3 (three) times daily.   HYDROcodone-acetaminophen (NORCO) 5-325 MG tablet Take 1 tablet by mouth every 6 (six) hours as needed for severe pain.   insulin aspart (NOVOLOG) 100 UNIT/ML FlexPen Inject 20 Units into the skin  See admin instructions. Inject 20 units into the skin two to three times a day before meals   insulin glargine, 2 Unit Dial, (TOUJEO MAX SOLOSTAR) 300 UNIT/ML Solostar Pen Inject 46 Units into the skin daily. (Patient taking differently: Inject 42 Units into the skin at bedtime.)   ipratropium (ATROVENT) 0.06 % nasal spray Place 1-2 sprays each nostril as needed up to 3 times a day (Patient taking differently: Place 1-2 sprays into both nostrils 3 (three) times daily as needed for rhinitis.)   irbesartan (AVAPRO) 300 MG tablet Take 1 tablet (300 mg total) by mouth daily.   Lancets Misc. (ACCU-CHEK SOFTCLIX LANCET DEV) KIT See admin instructions.   latanoprost (XALATAN) 0.005 % ophthalmic solution Place 1 drop into both eyes at bedtime.   metaxalone (SKELAXIN) 800 MG tablet Take 800 mg by mouth 3 (three) times daily as needed for muscle spasms.   Methotrexate Sodium (METHOTREXATE, PF,) 50 MG/2ML injection Inject into the muscle every Wednesday. Marland Kitchen8 units   metoprolol succinate (TOPROL-XL) 25 MG 24 hr tablet Take 1 tablet (25 mg total) by mouth daily. May take additional half tablet as needed once daily for palpitations. (Patient taking differently: Take 12.5-25 mg by mouth See admin instructions. Take 25 mg by mouth once a day and an additional 12.5 mg (half tablet) as needed once daily for palpitations)   Multiple Vitamins-Minerals (CENTRUM SILVER PO) Take 1 tablet by mouth daily with breakfast.   sulfaSALAzine (AZULFIDINE) 500 MG tablet Take 3 tablets (1,500 mg total) by mouth 2 (two) times daily.   SYNTHROID 125 MCG tablet Take 125 mcg by mouth daily before breakfast.   triamcinolone cream (KENALOG) 0.1 % Apply 1 Application topically 3  (three) times daily as needed (for itching- affected areas).   vitamin B-12 (CYANOCOBALAMIN) 1000 MCG tablet Take 1,000 mcg by mouth daily.   LORazepam (ATIVAN) 0.5 MG tablet Take 0.5 mg by mouth daily as needed for anxiety.   ondansetron (ZOFRAN) 4 MG tablet Take 4 mg by mouth every 8 (eight) hours as needed for nausea or vomiting.   tocilizumab (ACTEMRA) 400 MG/20ML SOLN injection Inject into the vein every 28 (twenty-eight) days.   No facility-administered encounter medications on file as of 09/17/2022.  :  Review of Systems:  Out of a complete 14 point review of systems, all are reviewed and negative with the exception of these symptoms as listed below:  Review of Systems  Neurological:        Pt here for balance consult Pt states since July 5 falls  Pt had right knee quad tendon repair in August . Pt walking with cane. Pt states tendon is re torn another surgery Nov 29th      Objective:  Neurological Exam  Physical Exam Physical Examination:   Vitals:   09/17/22 1003  BP: (!) 150/76  Pulse: 70    General Examination: The patient is a 66 y.o. female in no acute distress. She appears well-developed and well-nourished and well groomed.   HEENT: Normocephalic, atraumatic, pupils are equal and tracking is well-preserved, she has corrective eyeglasses in place.  Hearing is grossly intact, face is symmetric with normal facial animation.  Speech is clear without dysarthria, hypophonia or voice tremor.  Neck without difficulty in mobility, no carotid bruits. Oropharynx exam reveals: mild to moderate mouth dryness, adequate dental hygiene and moderate airway crowding. Tongue protrudes centrally and palate elevates symmetrically.    Chest: Clear to auscultation without wheezing, rhonchi or crackles noted.   Heart: S1+S2+0,  regular and normal without murmurs, rubs or gallops noted.    Abdomen: Soft, non-tender and non-distended.   Extremities: There is no obvious edema.   Skin: Warm  and dry without trophic changes noted.    Musculoskeletal: exam reveals pain in both hands.  Pain in both knees.  Limitation in range of motion in both knees.  She also reports intermittent pain in the lateral right leg.   Neurologically:  Mental status: The patient is awake, alert and oriented in all 4 spheres. Her immediate and remote memory, attention, language skills and fund of knowledge are appropriate. There is no evidence of aphasia, agnosia, apraxia or anomia. Speech is clear with normal prosody and enunciation. Thought process is linear. Mood is normal and affect is somewhat blunted.    Cranial nerves II - XII are as described above under HEENT exam.  Motor exam: Normal bulk, strength and tone is noted in the upper body, pain limitation in both lower extremities. There is no obvious tremor.  Fine motor skills and coordination: grossly intact.  Cerebellar testing: No dysmetria or intention tremor. There is no truncal or gait ataxia. Sensory exam: intact to light touch in the upper and lower extremities.  Gait, station and balance: She stands without difficulty. Gait shows normal stride length and normal pace.     Assessment and Plan:  In summary, Jocelyn Kaufman is a very pleasant 66 year old female with an underlying complex medical history hypertension, hyperlipidemia, hypothyroidism, migraine headaches, rheumatoid arthritis, asthma, history of angioedema, adrenal adenoma, anemia, Cushing's disease, atrial fibrillation, depression, diabetes, fibromyalgia, meralgia paresthetica, ulnar neuropathy left upper extremity, history of PE, OSA on PAP therapy, and severe obesity with a BMI of over 40, who presents for evaluation of her gait disorder, balance problem and recurrent falls.  She fell in July 2023 and injured her right quadriceps tendon, she needed surgery for this and since then has fallen 4 more times.  She does have lumbar degenerative disc disease and evidence of lumbar spinal stenosis.   The patient was frustrated during this appointment, she indicated not having given enough time to answer questions, she was offered a cancellation for this appointment and an option to see a different provider, also the option of getting her brain MRI ordered by your office.  She declined these offers and wanted to proceed with the appointment and complete the appointments.  Examination and history are not suggestive of an underlying primary neurological condition, in particular, no evidence of parkinsonism or cerebellar ataxia, no evidence of stroke or TIA by history and current exam findings.  I do believe her significant arthritis, fall with injury, lumbar degenerative disc disease, are likely contributors to her falls.  We talked about the importance of fall prevention and gait safety and she is encouraged to use her walker at all times at this time.  She is scheduled for additional surgery to the right leg next week.  She is advised to follow-up with orthopedics.  Of note, since she has not seen a spine specialist, she may benefit from seeing a spine specialist.  She is advised to follow-up with your office as scheduled, we will proceed with a brain MRI to look for structural cause of her symptoms, she was reassured that her history is not suggestive of stroke, TIA, Parkinson's disease or parkinsonism or vertigo.  She is agreeable to pursuing a brain MRI.  We will keep her posted as to her MRI results by phone call.  She is followed  in this office for her sleep apnea as well and can follow-up as scheduled.   I answered all her questions today and she was in agreement.   Thank you very much for allowing me to participate in the care of this nice patient. If I can be of any further assistance to you please do not hesitate to call me at [phone].   Sincerely,     [name], MD, PhD  I spent 45 minutes in total face-to-face time and in reviewing records during pre-charting, more than 50% of which was  spent in counseling and coordination of care, reviewing test results, reviewing medications and treatment regimen and/or in discussing or reviewing the diagnosis of falls, gait d/o, the prognosis and treatment options. Pertinent laboratory and imaging test results that were available during this visit with the patient were reviewed by me and considered in my medical decision making (see chart for details).

## 2022-09-17 NOTE — Patient Instructions (Addendum)
I am not sure why you have fallen. I am sorry to hear that you have injured yourself and that you have fallen several times.   Your balance issue and falls may be due to a combination of factors.  I do believe that most likely your falls are secondary to your arthritis and orthopedic issues including degenerative lumbar spine disease and arthritis as well as injuries from falls.  I do not see any evidence of parkinsonism and your history and exam are not concerning for a stroke or TIA.  Please follow-up with your orthopedic specialist.  Please remember to stand up slowly and get your bearings first turn slowly, no bending down to pick anything, no heavy lifting, be extra careful at night and first thing in the morning. Also, be careful in the Bathroom and the kitchen.   Remember to drink plenty of fluid, eat healthy meals and do not skip any meals. Try to eat protein with a every meal and eat a healthy snack such as fruit or nuts or yogurt in between meals. Try to keep a regular sleep-wake schedule and try to exercise daily, particularly in the form of walking, 20-30 minutes a day, if you can. I recommend, you start using your walker again for safety.    As far as your medications are concerned, I would like to suggest no new medications.   As far as diagnostic testing: I suggest we proceed with a brain scan, called MRI. We will call you with the test results. We will have to schedule you for this on a separate date. This test requires authorization from your insurance, and we will take care of the insurance process.

## 2022-09-18 ENCOUNTER — Telehealth: Payer: Self-pay | Admitting: Neurology

## 2022-09-18 ENCOUNTER — Other Ambulatory Visit: Payer: Self-pay | Admitting: *Deleted

## 2022-09-18 DIAGNOSIS — D649 Anemia, unspecified: Secondary | ICD-10-CM

## 2022-09-18 NOTE — Telephone Encounter (Signed)
Jocelyn Kaufman: [id number] exp. 09/18/22-10/18/22 sent to GI [phone]

## 2022-09-19 ENCOUNTER — Inpatient Hospital Stay: Payer: Medicare PPO | Admitting: Hematology and Oncology

## 2022-09-19 ENCOUNTER — Other Ambulatory Visit: Payer: Self-pay

## 2022-09-19 ENCOUNTER — Inpatient Hospital Stay: Payer: Medicare PPO

## 2022-09-19 VITALS — BP 151/63 | HR 76 | Temp 97.8°F | Resp 18 | Ht 64.0 in | Wt 241.0 lb

## 2022-09-19 DIAGNOSIS — D631 Anemia in chronic kidney disease: Secondary | ICD-10-CM

## 2022-09-19 DIAGNOSIS — N183 Chronic kidney disease, stage 3 unspecified: Secondary | ICD-10-CM

## 2022-09-19 DIAGNOSIS — I129 Hypertensive chronic kidney disease with stage 1 through stage 4 chronic kidney disease, or unspecified chronic kidney disease: Secondary | ICD-10-CM | POA: Diagnosis not present

## 2022-09-19 DIAGNOSIS — D649 Anemia, unspecified: Secondary | ICD-10-CM

## 2022-09-19 DIAGNOSIS — N1831 Chronic kidney disease, stage 3a: Secondary | ICD-10-CM | POA: Diagnosis not present

## 2022-09-19 LAB — CBC WITH DIFFERENTIAL (CANCER CENTER ONLY)
Abs Immature Granulocytes: 0.01 10*3/uL (ref 0.00–0.07)
Basophils Absolute: 0.1 10*3/uL (ref 0.0–0.1)
Basophils Relative: 1 %
Eosinophils Absolute: 0.1 10*3/uL (ref 0.0–0.5)
Eosinophils Relative: 2 %
HCT: 36.5 % (ref 36.0–46.0)
Hemoglobin: 11.2 g/dL — ABNORMAL LOW (ref 12.0–15.0)
Immature Granulocytes: 0 %
Lymphocytes Relative: 21 %
Lymphs Abs: 1.3 10*3/uL (ref 0.7–4.0)
MCH: 28.3 pg (ref 26.0–34.0)
MCHC: 30.7 g/dL (ref 30.0–36.0)
MCV: 92.2 fL (ref 80.0–100.0)
Monocytes Absolute: 0.5 10*3/uL (ref 0.1–1.0)
Monocytes Relative: 9 %
Neutro Abs: 4 10*3/uL (ref 1.7–7.7)
Neutrophils Relative %: 67 %
Platelet Count: 177 10*3/uL (ref 150–400)
RBC: 3.96 MIL/uL (ref 3.87–5.11)
RDW: 15.9 % — ABNORMAL HIGH (ref 11.5–15.5)
WBC Count: 6 10*3/uL (ref 4.0–10.5)
nRBC: 0 % (ref 0.0–0.2)

## 2022-09-19 NOTE — Assessment & Plan Note (Signed)
Patient has history of hypothyroidism as well as possible adrenal dysfunction issues for which she goes to [facility]   Previous blood work review: 1.  Erythropoietin: 24.2 2. SPEP: No M protein 3.  Haptoglobin: 146, LDH 281, absolute reticulocyte count 96.4, direct Coombs test negative 4.  B12 750 5.  Folic acid 27.7 6.  Ferritin 88, iron saturation 25 7. 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  (Retacrit injections for hemoglobin 10.5 or less) Lab review 03/23/2020: Erythropoietin 27.9, hemoglobin 9.6 02/02/2021: Hemoglobin 10.6 (given her severe symptoms continue with Aranesp) 06/22/2021: Hemoglobin 11.3  11/07/2021: Hemoglobin 11.1 11/21/2021: Hemoglobin 10.5 (no Retacrit today) 01/02/2022: Hemoglobin 10.7 (no Retacrit today): Last Retacrit was on 12/05/2021  02/01/2022: Hemoglobin 10.3: Proceeding to Retacrit today 05/24/2022: Hemoglobin 10.4: Retacrit being given today 08/31/2022: Hemoglobin 10.1   She usually feels better when the hemoglobin is closer to 11 but because of insurance reasons we are only able to do Retacrit to hemoglobin of 10.5. Return to clinic monthly for lab checks and Retacrit injection if needed.  Follow-up with me in 3 months

## 2022-09-19 NOTE — Progress Notes (Signed)
Patient Care Team: Deland Pretty, MD as PCP - General (Internal Medicine) Debara Pickett Nadean Corwin, MD as PCP - Cardiology (Cardiology) Nicholas Lose, MD as Consulting Physician (Hematology and Oncology) Lahoma Rocker, MD as Consulting Physician (Rheumatology) Madelin Rear, MD as Consulting Physician (Endocrinology) Marygrace Drought, MD as Consulting Physician (Ophthalmology) Claudia Desanctis, MD as Consulting Physician (Nephrology)  DIAGNOSIS:  Encounter Diagnosis  Name Primary?   Anemia in stage 3 chronic kidney disease, unspecified whether stage 3a or 3b CKD (HCC) Yes   CHIEF COMPLIANT: Follow-up of anemia of chronic kidney disease on Retacrit injections.   INTERVAL HISTORY: Jocelyn Kaufman is a 66 y.o. with above-mentioned history of anemia of chronic kidney disease. She presents to the clinic today for a follow-up. She reports no new concerns. She will be having surgery around the end of November on her knee.   ALLERGIES:  is allergic to semaglutide(0.25 or 0.27m-dos), shellfish-derived products, empagliflozin, rho (d) immune globulin, tramadol hcl, canagliflozin, latex, liraglutide, minoxidil, omeprazole, and percocet [oxycodone-acetaminophen].  MEDICATIONS:  Current Outpatient Medications  Medication Sig Dispense Refill   Accu-Chek Softclix Lancets lancets use to check blood sugar     acetaminophen (TYLENOL) 500 MG tablet Take 1,000 mg by mouth every 6 (six) hours as needed for mild pain or headache.     Aflibercept (EYLEA) 2 MG/0.05ML SOLN 2 mg by Intravitreal route every 3 (three) months.     albuterol (VENTOLIN HFA) 108 (90 Base) MCG/ACT inhaler Inhale two puffs every four to six hours as needed for cough or wheeze. (Patient taking differently: Inhale 1-2 puffs into the lungs every 6 (six) hours as needed for wheezing.) 18 g 2   amLODipine (NORVASC) 10 MG tablet Take 1 tablet (10 mg total) by mouth daily.     APIXABAN (ELIQUIS) VTE STARTER PACK (10MG AND 5MG) Take as directed on  package: start with two-571mtablets twice daily for 7 days. On day 8, switch to one-20m61mablet twice daily. 74 tablet 0   atorvastatin (LIPITOR) 40 MG tablet Take 1 tablet (40 mg total) by mouth daily at 6 PM. (Patient taking differently: Take 40 mg by mouth daily.) 30 tablet 0   B-D ULTRAFINE III SHORT PEN 31G X 8 MM MISC SMARTSIG:1 Each SUB-Q Daily     CHELATED IRON PO Take 18 mg by mouth daily.     Cholecalciferol (VITAMIN D3) 125 MCG (5000 UT) CAPS Take 5,000 Units by mouth daily.     ciclopirox (PENLAC) 8 % solution Apply topically at bedtime. Apply over nail and surrounding skin. Apply daily over previous coat. After seven (7) days, may remove with alcohol and continue cycle. 6.6 mL 0   Dulaglutide (TRULICITY) 1.5 MG/XN/1.7GYPN Inject 1.5 mg into the skin every Tuesday.     EPINEPHrine (EPIPEN 2-PAK) 0.3 mg/0.3 mL IJ SOAJ injection Use as directed for life-threatening allergic reaction. 2 each 1   epoetin alfa-epbx (RETACRIT) 2000 UNIT/ML injection See admin instructions.     escitalopram (LEXAPRO) 20 MG tablet Take 20 mg by mouth daily.     ezetimibe (ZETIA) 10 MG tablet Take 1 tablet (10 mg total) by mouth daily.     fexofenadine (ALLEGRA ALLERGY) 180 MG tablet Take 1 tablet (180 mg total) by mouth daily.     folic acid (FOLVITE) 1 MG tablet Take 2 mg by mouth daily.     hydrALAZINE (APRESOLINE) 50 MG tablet Take 50 mg by mouth 3 (three) times daily.     HYDROcodone-acetaminophen (NORCO) 5-325 MG tablet  Take 1 tablet by mouth every 6 (six) hours as needed for severe pain. 15 tablet 0   hydrocortisone cream 1 % Apply 1 Application topically 2 (two) times daily as needed for itching.     insulin aspart (NOVOLOG) 100 UNIT/ML FlexPen Inject 20 Units into the skin See admin instructions. Inject 20 units into the skin two to three times a day before meals     insulin glargine, 2 Unit Dial, (TOUJEO MAX SOLOSTAR) 300 UNIT/ML Solostar Pen Inject 46 Units into the skin daily. (Patient taking  differently: Inject 42 Units into the skin at bedtime.)     ipratropium (ATROVENT) 0.06 % nasal spray Place 1-2 sprays each nostril as needed up to 3 times a day (Patient taking differently: Place 1-2 sprays into both nostrils 3 (three) times daily as needed for rhinitis.) 15 mL 5   irbesartan (AVAPRO) 300 MG tablet Take 1 tablet (300 mg total) by mouth daily.     Lancets Misc. (ACCU-CHEK SOFTCLIX LANCET DEV) KIT See admin instructions.     latanoprost (XALATAN) 0.005 % ophthalmic solution Place 1 drop into both eyes at bedtime.     lidocaine (LMX) 4 % cream Apply 1 Application topically as needed (pain).     metaxalone (SKELAXIN) 800 MG tablet Take 800 mg by mouth 3 (three) times daily as needed for muscle spasms.     Methotrexate Sodium (METHOTREXATE, PF,) 50 MG/2ML injection Inject 20 mg into the muscle every Wednesday. 0.8 ml     metoprolol succinate (TOPROL-XL) 25 MG 24 hr tablet Take 1 tablet (25 mg total) by mouth daily. May take additional half tablet as needed once daily for palpitations. (Patient taking differently: Take 12.5-25 mg by mouth See admin instructions. Take 25 mg by mouth once a day and an additional 12.5 mg (half tablet) as needed once daily for palpitations) 135 tablet 3   Multiple Vitamins-Minerals (CENTRUM SILVER PO) Take 1 tablet by mouth daily with breakfast.     predniSONE (DELTASONE) 10 MG tablet Take 10 mg by mouth daily.     sulfaSALAzine (AZULFIDINE) 500 MG tablet Take 3 tablets (1,500 mg total) by mouth 2 (two) times daily.     SYNTHROID 125 MCG tablet Take 125 mcg by mouth every Monday, Tuesday, Wednesday, Thursday, and Friday.     triamcinolone cream (KENALOG) 0.1 % Apply 1 Application topically 3 (three) times daily as needed (for itching- affected areas).     vitamin B-12 (CYANOCOBALAMIN) 1000 MCG tablet Take 1,000 mcg by mouth daily.     No current facility-administered medications for this visit.    PHYSICAL EXAMINATION: ECOG PERFORMANCE STATUS: 1 -  Symptomatic but completely ambulatory  Vitals:   09/19/22 1410  BP: (!) 151/63  Pulse: 76  Resp: 18  Temp: 97.8 F (36.6 C)  SpO2: 98%   Filed Weights   09/19/22 1410  Weight: 241 lb (109.3 kg)      LABORATORY DATA:  I have reviewed the data as listed    Latest Ref Rng & Units 08/31/2022    1:17 PM 08/21/2022    7:56 AM 08/20/2022   12:44 PM  CMP  Glucose 70 - 99 mg/dL 101  85  123   BUN 8 - 23 mg/dL _0 Creatinine 0.44 - 1.00 mg/dL 1.15  1.08  1.00   Sodium 135 - 145 mmol/L 140  137  138   Potassium 3.5 - 5.1 mmol/L 4.9  4.1  4.3   Chloride  98 - 111 mmol/L 110  107  105   CO2 22 - 32 mmol/L _0 Calcium 8.9 - 10.3 mg/dL 9.3  8.5  9.1   Total Protein 6.5 - 8.1 g/dL 6.0  5.3    Total Bilirubin 0.3 - 1.2 mg/dL 0.4  0.6    Alkaline Phos 38 - 126 U/L 74  56    AST 15 - 41 U/L 28  29    ALT 0 - 44 U/L 37  37      Lab Results  Component Value Date   WBC 6.0 09/19/2022   HGB 11.2 (L) 09/19/2022   HCT 36.5 09/19/2022   MCV 92.2 09/19/2022   PLT 177 09/19/2022   NEUTROABS 4.0 09/19/2022    ASSESSMENT & PLAN:  Anemia in chronic kidney disease Patient has history of hypothyroidism as well as possible adrenal dysfunction issues for which she goes to [facility]   Previous blood work review: 1.  Erythropoietin: 24.2 2. SPEP: No M protein 3.  Haptoglobin: 146, LDH 281, absolute reticulocyte count 96.4, direct Coombs test negative 4.  B12 750 5.  Folic acid 38.1 6.  Ferritin 88, iron saturation 25 7. 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  (Retacrit injections for hemoglobin 10.5 or less) Lab review 03/23/2020: Erythropoietin 27.9, hemoglobin 9.6 02/02/2021: Hemoglobin  10.6 (given her severe symptoms continue with Aranesp) 06/22/2021: Hemoglobin 11.3  11/07/2021: Hemoglobin 11.1 11/21/2021: Hemoglobin 10.5 (no Retacrit today) 01/02/2022: Hemoglobin 10.7 (no Retacrit today): Last Retacrit was on 12/05/2021  02/01/2022: Hemoglobin 10.3: Proceeding to Retacrit today 05/24/2022: Hemoglobin 10.4: Retacrit being given today 08/31/2022: Hemoglobin 10.1 09/19/2022: Hemoglobin 11.2 (Retacrit given 08/31/2022)   She usually feels better when the hemoglobin is closer to 11 but because of insurance reasons we are only able to do Retacrit to hemoglobin of 10.5. Patient is going to undergo a major knee surgery and will be 6 weeks in rehab.  I gave her a prescription for labs to be done on 10/31/2022.  Telephone visit day after to discuss results and to plan her Retacrit injections accordingly.      No orders of the defined types were placed in this encounter.  The patient has a good understanding of the overall plan. she agrees with it. she will call with any problems that may develop before the next visit here. Total time spent: 30 mins including face to face time and time spent for planning, charting and co-ordination of care   Harriette Ohara, MD 09/19/22    I Gardiner Coins am scribing for Dr. Lindi Adie  I have reviewed the above documentation for accuracy and completeness, and I agree with the above.

## 2022-09-19 NOTE — Pre-Procedure Instructions (Signed)
Surgical Instructions    Your procedure is scheduled on Wednesday, November 29th.  Report to [facility] Main Entrance "A" at 12:45 P.M., then check in with the Admitting office.  Call this number if you have problems the morning of surgery:  [phone]   If you have any questions prior to your surgery date call [phone]: Open Monday-Friday 8am-4pm    Remember:  Do not eat after midnight the night before your surgery  You may drink clear liquids until 11:45 AM the morning of your surgery.   Clear liquids allowed are: Water, Non-Citrus Juices (without pulp), Carbonated Beverages, Clear Tea, Black Coffee Only (NO MILK, CREAM OR POWDERED CREAMER of any kind), and Gatorade.    Take these medicines the morning of surgery with A SIP OF WATER  amLODipine (NORVASC)  atorvastatin (LIPITOR)  escitalopram (LEXAPRO)  ezetimibe (ZETIA)  fexofenadine (ALLEGRA ALLERGY)  hydrALAZINE (APRESOLINE)  metoprolol succinate (TOPROL-XL)  predniSONE (DELTASONE)  sulfaSALAzine (AZULFIDINE)  SYNTHROID    If needed: acetaminophen (TYLENOL)  albuterol (VENTOLIN HFA)- if needed, bring with you on day of surgery EPINEPHrine (EPIPEN 2-PAK)  HYDROcodone-acetaminophen (NORCO)  ipratropium (ATROVENT) 0.06 % nasal spray  metaxalone (SKELAXIN)    Follow up with provider regarding Methotrexate Sodium (METHOTREXATE, PF,) and if this needs to be held prior to surgery.  Follow your surgeon's instructions on when to stop APIXABAN (ELIQUIS).  If no instructions were given by your surgeon then you will need to call the office to get those instructions.     As of today, STOP taking any Aspirin (unless otherwise instructed by your surgeon) Aleve, Naproxen, Ibuprofen, Motrin, Advil, Goody's, BC's, all herbal medications, fish oil, and all vitamins.   WHAT DO I DO ABOUT MY DIABETES MEDICATION?   Do not take Dulaglutide (TRULICITY) for 7 days prior to surgery. Last dose 11/21.  THE NIGHT BEFORE SURGERY, take  21 units (50%) of insulin glargine, 2 Unit Dial, (TOUJEO MAX SOLOSTAR).       If your CBG is greater than 220 mg/dL, you may take 10 units () of your insulin aspart (NOVOLOG) dose.   HOW TO MANAGE YOUR DIABETES BEFORE AND AFTER SURGERY  Why is it important to control my blood sugar before and after surgery? Improving blood sugar levels before and after surgery helps healing and can limit problems. A way of improving blood sugar control is eating a healthy diet by:  Eating less sugar and carbohydrates  Increasing activity/exercise  Talking with your doctor about reaching your blood sugar goals High blood sugars (greater than 180 mg/dL) can raise your risk of infections and slow your recovery, so you will need to focus on controlling your diabetes during the weeks before surgery. Make sure that the doctor who takes care of your diabetes knows about your planned surgery including the date and location.  How do I manage my blood sugar before surgery? Check your blood sugar at least 4 times a day, starting 2 days before surgery, to make sure that the level is not too high or low.  Check your blood sugar the morning of your surgery when you wake up and every 2 hours until you get to the Short Stay unit.  If your blood sugar is less than 70 mg/dL, you will need to treat for low blood sugar: Do not take insulin. Treat a low blood sugar (less than 70 mg/dL) with  cup of clear juice (cranberry or apple), 4 glucose tablets, OR glucose gel. Recheck blood sugar in 15 minutes  after treatment (to make sure it is greater than 70 mg/dL). If your blood sugar is not greater than 70 mg/dL on recheck, call [phone] for further instructions. Report your blood sugar to the short stay nurse when you get to Short Stay.  If you are admitted to the hospital after surgery: Your blood sugar will be checked by the staff and you will probably be given insulin after surgery (instead of oral diabetes medicines)  to make sure you have good blood sugar levels. The goal for blood sugar control after surgery is 80-180 mg/dL.                     Do NOT Smoke (Tobacco/Vaping) for 24 hours prior to your procedure.  If you use a CPAP at night, you may bring your mask/headgear for your overnight stay.   Contacts, glasses, piercing's, hearing aid's, dentures or partials may not be worn into surgery, please bring cases for these belongings.    For patients admitted to the hospital, discharge time will be determined by your treatment team.   Patients discharged the day of surgery will not be allowed to drive home, and someone needs to stay with them for 24 hours.  SURGICAL WAITING ROOM VISITATION Patients having surgery or a procedure may have no more than 2 support people in the waiting area - these visitors may rotate.   Children under the age of 66 must have an adult with them who is not the patient. If the patient needs to stay at the hospital during part of their recovery, the visitor guidelines for inpatient rooms apply. Pre-op nurse will coordinate an appropriate time for 1 support person to accompany patient in pre-op.  This support person may not rotate.   Please refer to the [facility] website for the visitor guidelines for Inpatients (after your surgery is over and you are in a regular room).    Special instructions:   [facility]- Preparing For Surgery  Before surgery, you can play an important role. Because skin is not sterile, your skin needs to be as free of germs as possible. You can reduce the number of germs on your skin by washing with CHG (chlorahexidine gluconate) Soap before surgery.  CHG is an antiseptic cleaner which kills germs and bonds with the skin to continue killing germs even after washing.    Oral Hygiene is also important to reduce your risk of infection.  Remember - BRUSH YOUR TEETH THE MORNING OF SURGERY WITH YOUR REGULAR TOOTHPASTE  Please do not use if you have an  allergy to CHG or antibacterial soaps. If your skin becomes reddened/irritated stop using the CHG.  Do not shave (including legs and underarms) for at least 48 hours prior to first CHG shower. It is OK to shave your face.  Please follow these instructions carefully.   Shower the NIGHT BEFORE SURGERY and the MORNING OF SURGERY  If you chose to wash your hair, wash your hair first as usual with your normal shampoo.  After you shampoo, rinse your hair and body thoroughly to remove the shampoo.  Use CHG Soap as you would any other liquid soap. You can apply CHG directly to the skin and wash gently with a scrungie or a clean washcloth.   Apply the CHG Soap to your body ONLY FROM THE NECK DOWN.  Do not use on open wounds or open sores. Avoid contact with your eyes, ears, mouth and genitals (private parts). Wash Face and genitals (  private parts)  with your normal soap.   Wash thoroughly, paying special attention to the area where your surgery will be performed.  Thoroughly rinse your body with warm water from the neck down.  DO NOT shower/wash with your normal soap after using and rinsing off the CHG Soap.  Pat yourself dry with a CLEAN TOWEL.  Wear CLEAN PAJAMAS to bed the night before surgery  Place CLEAN SHEETS on your bed the night before your surgery  DO NOT SLEEP WITH PETS.   Day of Surgery: Take a shower with CHG soap. Do not wear jewelry or makeup Do not wear lotions, powders, perfumes, or deodorant. Do not shave 48 hours prior to surgery.   Do not bring valuables to the hospital. [facility] is not responsible for any belongings or valuables. Do not wear nail polish, gel polish, artificial nails, or any other type of covering on natural nails (fingers and toes) If you have artificial nails or gel coating that need to be removed by a nail salon, please have this removed prior to surgery. Artificial nails or gel coating may interfere with anesthesia's ability to adequately  monitor your vital signs. Wear Clean/Comfortable clothing the morning of surgery Remember to brush your teeth WITH YOUR REGULAR TOOTHPASTE.   Please read over the following fact sheets that you were given.    If you received a COVID test during your pre-op visit  it is requested that you wear a mask when out in public, stay away from anyone that may not be feeling well and notify your surgeon if you develop symptoms. If you have been in contact with anyone that has tested positive in the last 10 days please notify you surgeon.

## 2022-09-21 ENCOUNTER — Other Ambulatory Visit (HOSPITAL_COMMUNITY): Payer: Self-pay

## 2022-09-21 ENCOUNTER — Other Ambulatory Visit: Payer: Self-pay | Admitting: *Deleted

## 2022-09-21 ENCOUNTER — Telehealth: Payer: Self-pay | Admitting: Hematology and Oncology

## 2022-09-21 ENCOUNTER — Other Ambulatory Visit: Payer: Self-pay

## 2022-09-21 ENCOUNTER — Other Ambulatory Visit: Payer: Self-pay | Admitting: Adult Health

## 2022-09-21 ENCOUNTER — Encounter (HOSPITAL_COMMUNITY): Payer: Self-pay

## 2022-09-21 ENCOUNTER — Encounter (HOSPITAL_COMMUNITY)
Admission: RE | Admit: 2022-09-21 | Discharge: 2022-09-21 | Disposition: A | Discharge status: Home / Self Care | Payer: Medicare PPO | Source: Ambulatory Visit | Attending: Orthopaedic Surgery | Admitting: Orthopaedic Surgery

## 2022-09-21 ENCOUNTER — Telehealth: Payer: Self-pay | Admitting: *Deleted

## 2022-09-21 VITALS — BP 137/55 | HR 64 | Temp 98.0°F | Resp 18 | Ht 64.5 in | Wt 241.0 lb

## 2022-09-21 DIAGNOSIS — I10 Essential (primary) hypertension: Secondary | ICD-10-CM | POA: Insufficient documentation

## 2022-09-21 DIAGNOSIS — Z794 Long term (current) use of insulin: Secondary | ICD-10-CM

## 2022-09-21 DIAGNOSIS — Z01812 Encounter for preprocedural laboratory examination: Secondary | ICD-10-CM | POA: Diagnosis not present

## 2022-09-21 HISTORY — DX: Chronic kidney disease, unspecified: N18.9

## 2022-09-21 LAB — BASIC METABOLIC PANEL
Anion gap: 6 (ref 5–15)
BUN: 20 mg/dL (ref 8–23)
CO2: 25 mmol/L (ref 22–32)
Calcium: 8.7 mg/dL — ABNORMAL LOW (ref 8.9–10.3)
Chloride: 110 mmol/L (ref 98–111)
Creatinine, Ser: 1.08 mg/dL — ABNORMAL HIGH (ref 0.44–1.00)
GFR, Estimated: 57 mL/min — ABNORMAL LOW (ref >60)
Glucose, Bld: 115 mg/dL — ABNORMAL HIGH (ref 70–99)
Potassium: 4.1 mmol/L (ref 3.5–5.1)
Sodium: 141 mmol/L (ref 135–145)

## 2022-09-21 LAB — CBC
HCT: 34.1 % — ABNORMAL LOW (ref 36.0–46.0)
Hemoglobin: 10.5 g/dL — ABNORMAL LOW (ref 12.0–15.0)
MCH: 28.7 pg (ref 26.0–34.0)
MCHC: 30.8 g/dL (ref 30.0–36.0)
MCV: 93.2 fL (ref 80.0–100.0)
Platelets: 162 10*3/uL (ref 150–400)
RBC: 3.66 MIL/uL — ABNORMAL LOW (ref 3.87–5.11)
RDW: 15.7 % — ABNORMAL HIGH (ref 11.5–15.5)
WBC: 5 10*3/uL (ref 4.0–10.5)
nRBC: 0 % (ref 0.0–0.2)

## 2022-09-21 LAB — GLUCOSE, CAPILLARY: Glucose-Capillary: 113 mg/dL — ABNORMAL HIGH (ref 70–99)

## 2022-09-21 MED ORDER — ENOXAPARIN SODIUM 150 MG/ML IJ SOSY: 150.0000 mg | PREFILLED_SYRINGE | Freq: Two times a day (BID) | INTRAMUSCULAR | 0 refills | Status: DC

## 2022-09-21 MED ORDER — ENOXAPARIN SODIUM 150 MG/ML IJ SOSY
1.5000 mg/kg | PREFILLED_SYRINGE | Freq: Two times a day (BID) | INTRAMUSCULAR | 0 refills | Status: DC
  Filled 2022-09-21: qty 4.4, 2d supply, fill #0

## 2022-09-21 NOTE — Telephone Encounter (Signed)
Scheduled appointment per 11/22 los. Left voicemail.

## 2022-09-21 NOTE — Progress Notes (Addendum)
PCP - Deland Pretty Cardiologist - Dr. Debara Pickett  PPM/ICD - Denies  Chest x-ray - NI EKG - 08/20/22 Stress Test - 07/31/22 ECHO - 08/21/22 Cardiac Cath - 10/08/07  Sleep Study - Yes has OSa CPAP - nightly  DM - Type II CBG at PAT appt 113 Fasting Blood Sugar - 80-94 Checks Blood Sugar daily  Last dose of GLP1 agonist-  Trulicity 03/00/92 GLP1 instructions: Will not take 29th dose  Blood Thinner Instructions: Eliquis per patient told she could take it up until Tuesday 09/25/22 Aspirin Instructions:Denies  Per patient instructed to stop her methotrexate 1 week prior to her procedure / will stop the sulfasalazine 3 days prior to surgery / waiting to start her Enbrel 3 weeks post surgery   COVID TEST- NI   Anesthesia review: Yes cardiac history  Patient denies shortness of breath, fever, cough and chest pain at PAT appointment   All instructions explained to the patient, with a verbal understanding of the material. Patient agrees to go over the instructions while at home for a better understanding. The opportunity to ask questions was provided.

## 2022-09-21 NOTE — Telephone Encounter (Signed)
Received preoperative risk assessment for pt right quadriceps tendon rupture ligament repair x2 on 09/26/22.  Per MD okay for pt to proceed with surgery.  Verbal orders received from MD for pt to stop Xarelto Sunday 09/23/22.  Patient will need to self administer Lovenox 150 mg subcu BID 09/24/22 and 09/25/22.  No anticoagulation the day of surgery 09/26/22 and patient to resume Xarelto day after surgery 09/27/22.  Prescription sent to pharmacy on file.  RN attempt x1 to contact pt, no answer.  LVM for pt to return call to the office. RN also successfully faxed assessment to [facility] [phone]).

## 2022-09-21 NOTE — Telephone Encounter (Signed)
Patient returned call to the office and verified she is taking Eliquis and not Xarelto.  RN educated pt on when when to Stop Eliquis and begin Lovenox injections.  Pt verbalized understanding.

## 2022-09-24 DIAGNOSIS — E1121 Type 2 diabetes mellitus with diabetic nephropathy: Secondary | ICD-10-CM | POA: Diagnosis not present

## 2022-09-24 DIAGNOSIS — I10 Essential (primary) hypertension: Secondary | ICD-10-CM | POA: Diagnosis not present

## 2022-09-24 DIAGNOSIS — Z01818 Encounter for other preprocedural examination: Secondary | ICD-10-CM | POA: Diagnosis not present

## 2022-09-24 NOTE — Anesthesia Preprocedure Evaluation (Signed)
Anesthesia Evaluation  Patient identified by MRN, date of birth, ID band Patient awake    Reviewed: Allergy & Precautions, NPO status , Patient's Chart, lab work & pertinent test results, reviewed documented beta blocker date and time   History of Anesthesia Complications (+) history of anesthetic complications (pt reports chipped lower tooth during prior anesthetic)  Airway Mallampati: II  TM Distance: >3 FB Neck ROM: Full    Dental  (+) Dental Advisory Given, Caps   Pulmonary asthma , sleep apnea and Continuous Positive Airway Pressure Ventilation , COPD,  COPD inhaler, PE   breath sounds clear to auscultation       Cardiovascular hypertension, Pt. on medications and Pt. on home beta blockers (-) angina (-) dysrhythmias  Rhythm:Irregular Rate:Normal  07/2022 ECHO: EF 45-50%, mildly decreased LVF, global hypokinesis, normal RVF, small pericardial effusion, no significant valvular abnormalities  07/2022 low risk nuclear stress test   Neuro/Psych  Headaches  Anxiety Depression    TIA   GI/Hepatic   Endo/Other  diabetes (glu 67), Insulin DependentHypothyroidism  Morbid obesityBMI 16.1 trulicity  Renal/GU      Musculoskeletal  (+) Arthritis , Rheumatoid disorders and steroids,  Fibromyalgia -  Abdominal  (+) + obese  Peds  Hematology  (+) Blood dyscrasia (Hb 10.5), anemia Eliquis: last dose Sunday   Anesthesia Other Findings   Reproductive/Obstetrics                             Anesthesia Physical Anesthesia Plan  ASA: 3  Anesthesia Plan: General   Post-op Pain Management: Regional block* and Tylenol PO (pre-op)*   Induction: Intravenous  PONV Risk Score and Plan: 3 and Ondansetron, Dexamethasone, Midazolam and Treatment may vary due to age or medical condition  Airway Management Planned: LMA  Additional Equipment: None  Intra-op Plan:   Post-operative Plan:   Informed Consent:  I have reviewed the patients History and Physical, chart, labs and discussed the procedure including the risks, benefits and alternatives for the proposed anesthesia with the patient or authorized representative who has indicated his/her understanding and acceptance.     Dental advisory given  Plan Discussed with: CRNA and Surgeon  Anesthesia Plan Comments: (Plan routine monitors, GA with femoral nerve block for post op analgesia  PAT note by Karoline Caldwell, PA-C: Recent history of right quadriceps tendon repair ~2 months ago. She presented to the ED on 08/20/22 with ongoing right-sided anterior chest wall pain. She was found to have a small subsegmental pulmonary embolism likely precipitated due to immobilization of the right leg for 6 weeks. Duplex  negative for DVT.  Echo showed EF 45 to 50%, small pericardial effusion, mild mitral regurgitation. Monitored in the hospital. Remained clinically well. Discharged with Eliquis. Suggested 3 months of therapydue to provoked PE.   Given recent PE, patient has perioperative Lovenox bridging instructions from hematology.  Per telephone encounter 09/21/2022, "Received preoperative risk assessment for pt right quadriceps tendon rupture ligament repair x2 on 09/26/22. Per MD okay for pt to proceed with surgery. Verbal orders received from MD for pt to stop Xarelto Sunday 09/23/22. Patient will need to self administer Lovenox 150 mg subcu BID 09/24/22 and 09/25/22. No anticoagulation the day of surgery 09/26/22 and patient to resume Xarelto day after surgery 09/27/22."  She also follows with hematology for history of anemia associated with CKD III.  She receives Retacrit injections for hemoglobin <10.5.  Retacrit was given 08/31/2022.  Hemoglobin 09/19/2022 was  11.2.  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  She had a low risk nuclear stress 07/31/2022.  Her symptoms are noted to be well controlled on metoprolol.  History rheumatoid arthritis,  maintained on methotrexate, sulfasalazine, Enbrel.  OSA on CPAP.  Patient on GLP-1 agonist Trulicity for treatment of insulin-dependent type 2 diabetes.  Last dose 09/18/2022.  Pt on therapeutic dose of Lovenox, will not be candidate for spinal anesthesia due to <24 hr medication hold per bridging protocol as determined by hematology.  Preop labs reviewed, creatinine mildly elevated at 1.08, stable chronic anemia with hemoglobin 10.5, otherwise unremarkable.  Reviewed recent admission with anesthesiologist Dr. Cheral Bay, okay to proceed as planned barring acute status change.   Addendum 09/25/2022: Patient seen by PCP Deon Pilling, NP 09/24/2022 for preop evaluation.  Per note, "I believe she is at moderate risk for surgery.  This is due to age, BMI, DM, HTN, and mild to moderate systemic disease.  Risks of surgery discussed.  Due to her instability and frequent falls, we do discussed the necessity of this procedure.  She has a heart murmur.  Echo done this year.  Recent EKG was unchanged from previous.  From a medical standpoint, I feel she has no contraindications for surgery."  EKG 08/20/22: NSR. Rate 66. LAFB. Possible anterior infarct. No significant change from prior.   CTA chest 08/20/2022: IMPRESSION: 1. Positive examination for pulmonary embolism, with subsegmental embolus present in the posterior right upper lobe. No other evidence of pulmonary embolism. 2. Interlobular septal thickening and diffuse bilateral bronchial wall thickening, consistent with edema. 3. Cardiomegaly and coronary artery disease. 4. Enlargement of the main pulmonary artery, as can be seen in pulmonary hypertension.  TTE 08/21/2022: 1. Left ventricular ejection fraction, by estimation, is 45 to 50%. Left  ventricular ejection fraction by 2D MOD biplane is 49.1 %. The left  ventricle has mildly decreased function. The left ventricle demonstrates  global hypokinesis. Indeterminate  diastolic filling due to E-A  fusion.  2. Right ventricular systolic function is normal. The right ventricular  size is normal. Tricuspid regurgitation signal is inadequate for assessing  PA pressure.  3. A small pericardial effusion is present. The pericardial effusion is  circumferential.  4. The mitral valve is degenerative. Mild mitral valve regurgitation. No  evidence of mitral stenosis.  5. The aortic valve is tricuspid. There is mild calcification of the  aortic valve. There is mild thickening of the aortic valve. Aortic valve  regurgitation is not visualized. Aortic valve sclerosis/calcification is  present, without any evidence of  aortic stenosis.  6. The inferior vena cava is normal in size with greater than 50%  respiratory variability, suggesting right atrial pressure of 3 mmHg.   Comparison(s): Changes from prior study are noted. EF mildly reduced on  this study.   Nuclear stress 07/31/2022:  The study is normal. The study is low risk.  No ST deviation was noted.  LV perfusion is normal. There is no evidence of ischemia. There is no evidence of infarction.  Left ventricular function is normal. Nuclear stress EF: 55 %. The left ventricular ejection fraction is normal (55-65%). End diastolic cavity size is normal. End systolic cavity size is normal.  Prior study available for comparison from 10/16/2018.  Breast attenuation no ischemia normal EF 55%   TTE 05/16/2022: 1. Left ventricular ejection fraction, by estimation, is 55 to 60%. The  left ventricle has normal function. The left ventricle has no regional  wall motion abnormalities. Left ventricular  diastolic function could not  be evaluated.  2. Right ventricular systolic function is normal. The right ventricular  size is normal. There is normal pulmonary artery systolic pressure.  3. Left atrial size was moderately dilated.  4. Right atrial size was moderately dilated.  5. A small pericardial effusion is present.  6. The  mitral valve is grossly normal. Trivial mitral valve  regurgitation. No evidence of mitral stenosis. Moderate to severe mitral  annular calcification.  7. The aortic valve was not well visualized. Aortic valve regurgitation  is not visualized.  8. The inferior vena cava is normal in size with greater than 50%  respiratory variability, suggesting right atrial pressure of 3 mmHg.   Comparison(s): No significant change from prior study.   Conclusion(s)/Recommendation(s): Otherwise normal echocardiogram, with  minor abnormalities described in the report.    )        Anesthesia Quick Evaluation

## 2022-09-24 NOTE — Progress Notes (Signed)
Anesthesia Chart Review:  Recent history of right quadriceps tendon repair ~2 months ago. She presented to the ED on 08/20/22 with ongoing right-sided anterior chest wall pain. She was found to have a small subsegmental pulmonary embolism likely precipitated due to immobilization of the right leg for 6 weeks.  Duplex  negative for DVT.  Echo showed EF 45 to 50%, small pericardial effusion, mild mitral regurgitation.  Monitored in the hospital.  Remained clinically well.  Discharged with Eliquis.  Suggested 3 months of therapy due to provoked PE.   Given recent PE, patient has perioperative Lovenox bridging instructions from hematology.  Per telephone encounter 09/21/2022, "Received preoperative risk assessment for pt right quadriceps tendon rupture ligament repair x2 on 09/26/22.  Per MD okay for pt to proceed with surgery.  Verbal orders received from MD for pt to stop Xarelto Sunday 09/23/22.  Patient will need to self administer Lovenox 150 mg subcu BID 09/24/22 and 09/25/22.  No anticoagulation the day of surgery 09/26/22 and patient to resume Xarelto day after surgery 09/27/22."  She also follows with hematology for history of anemia associated with CKD III.  She receives Retacrit injections for hemoglobin <10.5.  Retacrit was given 08/31/2022.  Hemoglobin 09/19/2022 was 11.2.  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  She had a low risk nuclear stress 07/31/2022.  Her symptoms are noted to be well controlled on metoprolol.  History rheumatoid arthritis, maintained on methotrexate, sulfasalazine, Enbrel.  OSA on CPAP.  Patient on GLP-1 agonist Trulicity for treatment of insulin-dependent type 2 diabetes.  Last dose 09/18/2022.  Pt on therapeutic dose of Lovenox, will not be candidate for spinal anesthesia due to <24 hr medication hold per bridging protocol as determined by hematology.  Preop labs reviewed, creatinine mildly elevated at 1.08, stable chronic anemia with hemoglobin 10.5,  otherwise unremarkable.  Reviewed recent admission with anesthesiologist Dr. Cheral Bay, okay to proceed as planned barring acute status change.   EKG 08/20/22: NSR. Rate 66. LAFB. Possible anterior infarct. No significant change from prior.   CTA chest 08/20/2022: IMPRESSION: 1. Positive examination for pulmonary embolism, with subsegmental embolus present in the posterior right upper lobe. No other evidence of pulmonary embolism. 2. Interlobular septal thickening and diffuse bilateral bronchial wall thickening, consistent with edema. 3. Cardiomegaly and coronary artery disease. 4. Enlargement of the main pulmonary artery, as can be seen in pulmonary hypertension.  TTE 08/21/2022:  1. Left ventricular ejection fraction, by estimation, is 45 to 50%. Left  ventricular ejection fraction by 2D MOD biplane is 49.1 %. The left  ventricle has mildly decreased function. The left ventricle demonstrates  global hypokinesis. Indeterminate  diastolic filling due to E-A fusion.   2. Right ventricular systolic function is normal. The right ventricular  size is normal. Tricuspid regurgitation signal is inadequate for assessing  PA pressure.   3. A small pericardial effusion is present. The pericardial effusion is  circumferential.   4. The mitral valve is degenerative. Mild mitral valve regurgitation. No  evidence of mitral stenosis.   5. The aortic valve is tricuspid. There is mild calcification of the  aortic valve. There is mild thickening of the aortic valve. Aortic valve  regurgitation is not visualized. Aortic valve sclerosis/calcification is  present, without any evidence of  aortic stenosis.   6. The inferior vena cava is normal in size with greater than 50%  respiratory variability, suggesting right atrial pressure of 3 mmHg.   Comparison(s): Changes from prior study are noted. EF mildly reduced  on  this study.   Nuclear stress 07/31/2022:   The study is normal. The study is low  risk.   No ST deviation was noted.   LV perfusion is normal. There is no evidence of ischemia. There is no evidence of infarction.   Left ventricular function is normal. Nuclear stress EF: 55 %. The left ventricular ejection fraction is normal (55-65%). End diastolic cavity size is normal. End systolic cavity size is normal.   Prior study available for comparison from 10/16/2018.   Breast attenuation no ischemia normal EF 55%   TTE 05/16/2022:  1. Left ventricular ejection fraction, by estimation, is 55 to 60%. The  left ventricle has normal function. The left ventricle has no regional  wall motion abnormalities. Left ventricular diastolic function could not  be evaluated.   2. Right ventricular systolic function is normal. The right ventricular  size is normal. There is normal pulmonary artery systolic pressure.   3. Left atrial size was moderately dilated.   4. Right atrial size was moderately dilated.   5. A small pericardial effusion is present.   6. The mitral valve is grossly normal. Trivial mitral valve  regurgitation. No evidence of mitral stenosis. Moderate to severe mitral  annular calcification.   7. The aortic valve was not well visualized. Aortic valve regurgitation  is not visualized.   8. The inferior vena cava is normal in size with greater than 50%  respiratory variability, suggesting right atrial pressure of 3 mmHg.   Comparison(s): No significant change from prior study.   Conclusion(s)/Recommendation(s): Otherwise normal echocardiogram, with  minor abnormalities described in the report.     Wynonia Musty [facility]/Anesthesiology Phone [phone] 09/24/2022 3:17 PM

## 2022-09-25 NOTE — H&P (Signed)
PREOPERATIVE H&P  Chief Complaint: right quad tendon rupture, instability  HPI: Jocelyn Kaufman is a 66 y.o. female who presents for preoperative history and physical prior to scheduled surgery, Procedure(s): REPAIR QUADRICEP TENDON LIGAMENT REPAIR/knee.   Patient has a past medical history significant for DM, HTN, fibromyalgia, hypothyroidism, RA, CKD.   Patient is a 66 year-old who had a quadriceps rupture in July. She had a quadriceps repair on 05/30/22. Unfortunately she had a recurrent tear of her quad after multiple falls in the recovery state.  She is frustrated by this.  She finds it increasingly difficult to walk.  She is weight bearing with a cane.    Symptoms are rated as moderate to severe, and have been worsening.  This is significantly impairing activities of daily living.    Please see clinic note for further details on this patient's care.    She has elected for surgical management.   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   Urticaria    Past Surgical History:  Procedure Laterality Date   ADENOIDECTOMY     APPENDECTOMY     biopsy Left 12/2021   shin   biospy Right 04/2021   R 5th digit   biospy Left 04/2021   foot plantar surface   colonoscopy and endoscopy  2021   CYST REMOVAL LEG Right    Removed from right thigh joint   EYE SURGERY Bilateral 09/2021   cataract   foot surgery Left 09/2021   Excision lesion plantar left foot   ORIF HUMERUS FRACTURE Right 07/09/2018    Procedure: OPEN REDUCTION INTERNAL FIXATION (ORIF) HUMERAL SHAFT FRACTURE;  Surgeon: Hiram Gash, MD;  Location: [place];  Service: Orthopedics;  Laterality: Right;   TONSILLECTOMY     torn right knee tendon repair Right    2023   TOTAL VAGINAL HYSTERECTOMY     UMBILICAL HERNIA REPAIR     Social History   Socioeconomic History   Marital status: Single    Spouse name: Not on file   Number of children: 0   Years of education: Masters   Highest education level: Not on file  Occupational History   Occupation: [employer]  Tobacco Use   Smoking status: Never   Smokeless tobacco: Never  Vaping Use   Vaping Use: Never used  Substance and Sexual Activity   Alcohol use: Not Currently    Comment: occ   Drug use: No   Sexual activity: Not Currently  Other Topics Concern   Not on file  Social History Narrative   Lives alone   Caffeine use: 2 cups coffee per day   Tea sometimes   Soda- 3 x/week   Right-handed   Retired.    Masters Degree   No Children   Single.   Social Determinants of Health   Financial Resource Strain: Not  on file  Food Insecurity: No Food Insecurity (08/21/2022)   Hunger Vital Sign    Worried About Running Out of Food in the Last Year: Never true    Ran Out of Food in the Last Year: Never true  Transportation Needs: No Transportation Needs (08/21/2022)   PRAPARE - Hydrologist (Medical): No    Lack of Transportation (Non-Medical): No  Physical Activity: Not on file  Stress: Not on file  Social Connections: Not on file   Family History  Problem Relation Age of Onset   Hypertension Mother    Stroke Mother    Kidney failure Mother    Heart disease Mother        heart murmur   Glaucoma Mother    Stroke Father    COPD Father    Cancer - Prostate Father    Colon cancer Father    Diabetes Sister    Thyroid cancer Sister    Epilepsy Sister    Ovarian cancer Paternal Aunt    Allergies  Allergen Reactions    Semaglutide(0.25 Or 0.102m-Dos) Other (See Comments)    Causes cataracts requiring surgery and impairing vision    Shellfish-Derived Products Anaphylaxis, Swelling and Other (See Comments)    Patient avoids   Empagliflozin Itching, Swelling and Other (See Comments)    UTI sx's and yeast infections   Rho (D) Immune Globulin      Not available   Tramadol Hcl Other (See Comments)    hallucinations   Canagliflozin Other (See Comments)    Urinary incontinence    Latex Rash   Liraglutide Itching    Other reaction(s): itching all over   Minoxidil Rash    skin irritation   Omeprazole Rash   Percocet [Oxycodone-Acetaminophen] Nausea And Vomiting   Prior to Admission medications   Medication Sig Start Date End Date Taking? Authorizing Provider  acetaminophen (TYLENOL) 500 MG tablet Take 1,000 mg by mouth every 6 (six) hours as needed for mild pain or headache.   Yes [provider]  Aflibercept (EYLEA) 2 MG/0.05ML SOLN 2 mg by Intravitreal route every 3 (three) months. 04/25/20  Yes GNicholas Lose MD  albuterol (VENTOLIN HFA) 108 (90 Base) MCG/ACT inhaler Inhale two puffs every four to six hours as needed for cough or wheeze. Patient taking differently: Inhale 1-2 puffs into the lungs every 6 (six) hours as needed for wheezing. 10/05/21  Yes Padgett, SRae Halsted MD  amLODipine (NORVASC) 10 MG tablet Take 1 tablet (10 mg total) by mouth daily. 02/01/22  Yes GNicholas Lose MD  APIXABAN ([place] VTE STARTER PACK (10MG AND 5MG) Take as directed on package: start with two-5361mtablets twice daily for 7 days. On day 8, switch to one-61m22mablet twice daily. 08/21/22  Yes GhiBarb MerinoD  atorvastatin (LIPITOR) 40 MG tablet Take 1 tablet (40 mg total) by mouth daily at 6 PM. Patient taking differently: Take 40 mg by mouth daily. 07/12/18  Yes OgbDana Allan MD  CHELATED IRON PO Take 18 mg by mouth daily.   Yes [provider]  Cholecalciferol (VITAMIN D3) 125 MCG (5000 UT)  CAPS Take 5,000 Units by mouth daily.   Yes [provider]  ciclopirox (PENLAC) 8 % solution Apply topically at bedtime. Apply over nail and surrounding skin. Apply daily over previous coat. After seven (7) days, may remove with alcohol and continue cycle. 11/09/21  Yes McDonald, Adam R, DPM  Dulaglutide (TRULICITY) 1.5 MG/WU/8.8BVPN Inject 1.5 mg  into the skin every Tuesday.   Yes [provider]  EPINEPHrine (EPIPEN 2-PAK) 0.3 mg/0.3 mL IJ SOAJ injection Use as directed for life-threatening allergic reaction. 04/05/22  Yes Kennith Gain, MD  epoetin alfa-epbx (RETACRIT) 2000 UNIT/ML injection See admin instructions.   Yes [provider]  escitalopram (LEXAPRO) 20 MG tablet Take 20 mg by mouth daily.   Yes [provider]  ezetimibe (ZETIA) 10 MG tablet Take 1 tablet (10 mg total) by mouth daily. 04/25/20  Yes Nicholas Lose, MD  fexofenadine (ALLEGRA ALLERGY) 180 MG tablet Take 1 tablet (180 mg total) by mouth daily. 11/21/21  Yes Nicholas Lose, MD  folic acid (FOLVITE) 1 MG tablet Take 2 mg by mouth daily.   Yes [provider]  hydrALAZINE (APRESOLINE) 50 MG tablet Take 50 mg by mouth 3 (three) times daily.   Yes [provider]  HYDROcodone-acetaminophen (NORCO) 5-325 MG tablet Take 1 tablet by mouth every 6 (six) hours as needed for severe pain. 08/31/22  Yes Dede Query T, PA-C  hydrocortisone cream 1 % Apply 1 Application topically 2 (two) times daily as needed for itching.   Yes [provider]  insulin aspart (NOVOLOG) 100 UNIT/ML FlexPen Inject 20 Units into the skin See admin instructions. Inject 20 units into the skin two to three times a day before meals   Yes [provider]  insulin glargine, 2 Unit Dial, (TOUJEO MAX SOLOSTAR) 300 UNIT/ML Solostar Pen Inject 46 Units into the skin daily. Patient taking differently: Inject 42 Units into the skin at bedtime. 02/02/21  Yes Nicholas Lose, MD  ipratropium  (ATROVENT) 0.06 % nasal spray Place 1-2 sprays each nostril as needed up to 3 times a day Patient taking differently: Place 1-2 sprays into both nostrils 3 (three) times daily as needed for rhinitis. 04/05/22  Yes Padgett, Rae Halsted, MD  irbesartan (AVAPRO) 300 MG tablet Take 1 tablet (300 mg total) by mouth daily. 02/01/22  Yes Nicholas Lose, MD  latanoprost (XALATAN) 0.005 % ophthalmic solution Place 1 drop into both eyes at bedtime.   Yes [provider]  lidocaine (LMX) 4 % cream Apply 1 Application topically as needed (pain).   Yes [provider]  metaxalone (SKELAXIN) 800 MG tablet Take 800 mg by mouth 3 (three) times daily as needed for muscle spasms.   Yes [provider]  Methotrexate Sodium (METHOTREXATE, PF,) 50 MG/2ML injection Inject 20 mg into the muscle every Wednesday. 0.8 ml 06/26/22  Yes [provider]  metoprolol succinate (TOPROL-XL) 25 MG 24 hr tablet Take 1 tablet (25 mg total) by mouth daily. May take additional half tablet as needed once daily for palpitations. Patient taking differently: Take 12.5-25 mg by mouth See admin instructions. Take 25 mg by mouth once a day and an additional 12.5 mg (half tablet) as needed once daily for palpitations 05/07/22  Yes Loel Dubonnet, NP  Multiple Vitamins-Minerals (CENTRUM SILVER PO) Take 1 tablet by mouth daily with breakfast.   Yes [provider]  predniSONE (DELTASONE) 10 MG tablet Take 10 mg by mouth daily.   Yes [provider]  sulfaSALAzine (AZULFIDINE) 500 MG tablet Take 3 tablets (1,500 mg total) by mouth 2 (two) times daily. 11/21/21  Yes Nicholas Lose, MD  SYNTHROID 125 MCG tablet Take 125 mcg by mouth every Monday, Tuesday, Wednesday, Thursday, and Friday.   Yes [provider]  triamcinolone cream (KENALOG) 0.1 % Apply 1 Application topically 3 (three) times daily as needed (  for itching- affected areas). 08/03/22  Yes [provider]  vitamin B-12  (CYANOCOBALAMIN) 1000 MCG tablet Take 1,000 mcg by mouth daily.   Yes [provider]  Accu-Chek Softclix Lancets lancets use to check blood sugar 04/26/21   [provider]  B-D ULTRAFINE III SHORT PEN 31G X 8 MM MISC SMARTSIG:1 Each SUB-Q Daily 02/12/20   [provider]  enoxaparin (LOVENOX) 150 MG/ML injection Inject 1 mL (150 mg total) into the skin every 12 (twelve) hours. Self administer twice a day for 2 days starting 09/24/22. 09/21/22   Nicholas Lose, MD  Lancets Misc. (ACCU-CHEK SOFTCLIX LANCET DEV) KIT See admin instructions. 04/25/21   [provider]    ROS: All other systems have been reviewed and were otherwise negative with the exception of those mentioned in the HPI and as above.  Physical Exam: General: Alert, no acute distress Cardiovascular: No pedal edema Respiratory: No cyanosis, no use of accessory musculature GI: No organomegaly, abdomen is soft and non-tender Skin: No lesions in the area of chief complaint Neurologic: Sensation intact distally Psychiatric: Patient is competent for consent with normal mood and affect Lymphatic: No axillary or cervical lymphadenopathy  MUSCULOSKELETAL:  Range of motion of the knee is 0-90 degrees.  She has no real active straight leg raise.    Imaging: MRI reviewed again and demonstrates a quad rupture revision.    BMI: Estimated body mass index is 40.73 kg/m as calculated from the following:   Height as of 09/21/22: 5' 4.5" (1.638 m).   Weight as of 09/21/22: 109.3 kg.  Lab Results  Component Value Date   ALBUMIN 3.9 08/31/2022   Diabetes:   Patient has a diagnosis of diabetes,  Lab Results  Component Value Date   HGBA1C 5.6 08/21/2022   Smoking Status:   reports that she has never smoked. She has never used smokeless tobacco.    Assessment: right quad tendon rupture, instability  Plan: Plan for Procedure(s): REPAIR QUADRICEP TENDON LIGAMENT REPAIR/knee  At this point in  the setting of need for a revision, extensor mechanism repair we would likely use an allograft Achilles for augmentation.  We plan for hard body anchors in the patella.  We will repair and augment with allograft.  She understands the risks, including infection risk and viral transmission amongst others.  Stiffness was discussed at length and this is a likely issue with this surgery.  Surgery is for extensor mechanism repair.  We offered a second opinion for the patient and she declined that at this point.  She would also likely require an inpatient stay with a discharge to SNF.  We will place her in a long three sided splint versus we did offer external fixators, the patient's leg size may make it very difficult to be braced in any form.  She declined the external fixator and wants to try a three sided splint with transition to a cast.  We will start therapy after six weeks.  This is an extremely complicated case, but she is understanding of the complications and wants to go forward.  We will attempt to schedule at her convenience.    The risks benefits and alternatives were discussed with the patient including but not limited to the risks of nonoperative treatment, versus surgical intervention including infection, bleeding, nerve injury,  blood clots, cardiopulmonary complications, morbidity, mortality, among others, and they were willing to proceed.     Patient will be admitted for inpatient treatment for surgery, pain control, OT,  prophylactic antibiotics, VTE prophylaxis, and discharge planning. The patient is planning to be discharged to SNF.   The patient acknowledged the explanation, agreed to proceed with the plan and consent was signed.   Operative Plan: as above Discharge Medications: standard DVT Prophylaxis: Standard Physical Therapy: delayed but will need discharge to SNF Special Discharge needs: Splint. Discharge to SNF.    Ethelda Chick, PA-C  09/25/2022 5:26 PM

## 2022-09-26 ENCOUNTER — Inpatient Hospital Stay (HOSPITAL_COMMUNITY)
Admission: AD | Admit: 2022-09-26 | Discharge: 2022-10-04 | DRG: 501 | Disposition: A | Discharge status: Skilled Nursing Facility | Payer: Medicare PPO | Attending: Orthopaedic Surgery | Admitting: Orthopaedic Surgery

## 2022-09-26 ENCOUNTER — Encounter (HOSPITAL_COMMUNITY)
Admission: AD | Disposition: A | Discharge status: Skilled Nursing Facility | Payer: Self-pay | Source: Home / Self Care | Attending: Orthopaedic Surgery

## 2022-09-26 ENCOUNTER — Encounter (HOSPITAL_COMMUNITY): Payer: Self-pay | Admitting: Orthopaedic Surgery

## 2022-09-26 ENCOUNTER — Other Ambulatory Visit: Payer: Self-pay

## 2022-09-26 ENCOUNTER — Ambulatory Visit (HOSPITAL_COMMUNITY): Payer: Medicare PPO | Admitting: Physician Assistant

## 2022-09-26 ENCOUNTER — Ambulatory Visit (HOSPITAL_BASED_OUTPATIENT_CLINIC_OR_DEPARTMENT_OTHER): Payer: Medicare PPO | Admitting: Physician Assistant

## 2022-09-26 ENCOUNTER — Observation Stay (HOSPITAL_COMMUNITY): Payer: Medicare PPO

## 2022-09-26 DIAGNOSIS — Z7401 Bed confinement status: Secondary | ICD-10-CM | POA: Diagnosis not present

## 2022-09-26 DIAGNOSIS — E249 Cushing's syndrome, unspecified: Secondary | ICD-10-CM | POA: Diagnosis present

## 2022-09-26 DIAGNOSIS — N1831 Chronic kidney disease, stage 3a: Secondary | ICD-10-CM | POA: Diagnosis present

## 2022-09-26 DIAGNOSIS — I119 Hypertensive heart disease without heart failure: Secondary | ICD-10-CM | POA: Diagnosis not present

## 2022-09-26 DIAGNOSIS — D631 Anemia in chronic kidney disease: Secondary | ICD-10-CM | POA: Diagnosis present

## 2022-09-26 DIAGNOSIS — W19XXXS Unspecified fall, sequela: Secondary | ICD-10-CM | POA: Diagnosis present

## 2022-09-26 DIAGNOSIS — Z8249 Family history of ischemic heart disease and other diseases of the circulatory system: Secondary | ICD-10-CM

## 2022-09-26 DIAGNOSIS — Z825 Family history of asthma and other chronic lower respiratory diseases: Secondary | ICD-10-CM

## 2022-09-26 DIAGNOSIS — Z471 Aftercare following joint replacement surgery: Secondary | ICD-10-CM | POA: Diagnosis not present

## 2022-09-26 DIAGNOSIS — E039 Hypothyroidism, unspecified: Secondary | ICD-10-CM | POA: Diagnosis present

## 2022-09-26 DIAGNOSIS — J449 Chronic obstructive pulmonary disease, unspecified: Secondary | ICD-10-CM

## 2022-09-26 DIAGNOSIS — D500 Iron deficiency anemia secondary to blood loss (chronic): Secondary | ICD-10-CM | POA: Diagnosis not present

## 2022-09-26 DIAGNOSIS — M76891 Other specified enthesopathies of right lower limb, excluding foot: Secondary | ICD-10-CM | POA: Diagnosis not present

## 2022-09-26 DIAGNOSIS — E782 Mixed hyperlipidemia: Secondary | ICD-10-CM | POA: Diagnosis present

## 2022-09-26 DIAGNOSIS — F32A Depression, unspecified: Secondary | ICD-10-CM | POA: Diagnosis present

## 2022-09-26 DIAGNOSIS — S83421A Sprain of lateral collateral ligament of right knee, initial encounter: Secondary | ICD-10-CM | POA: Diagnosis not present

## 2022-09-26 DIAGNOSIS — Z794 Long term (current) use of insulin: Secondary | ICD-10-CM | POA: Diagnosis not present

## 2022-09-26 DIAGNOSIS — M797 Fibromyalgia: Secondary | ICD-10-CM | POA: Diagnosis present

## 2022-09-26 DIAGNOSIS — S76111S Strain of right quadriceps muscle, fascia and tendon, sequela: Principal | ICD-10-CM

## 2022-09-26 DIAGNOSIS — Z7901 Long term (current) use of anticoagulants: Secondary | ICD-10-CM

## 2022-09-26 DIAGNOSIS — R296 Repeated falls: Secondary | ICD-10-CM | POA: Diagnosis present

## 2022-09-26 DIAGNOSIS — Z841 Family history of disorders of kidney and ureter: Secondary | ICD-10-CM

## 2022-09-26 DIAGNOSIS — J45909 Unspecified asthma, uncomplicated: Secondary | ICD-10-CM | POA: Diagnosis present

## 2022-09-26 DIAGNOSIS — I10 Essential (primary) hypertension: Secondary | ICD-10-CM | POA: Diagnosis not present

## 2022-09-26 DIAGNOSIS — M069 Rheumatoid arthritis, unspecified: Secondary | ICD-10-CM | POA: Diagnosis present

## 2022-09-26 DIAGNOSIS — R2681 Unsteadiness on feet: Secondary | ICD-10-CM | POA: Diagnosis not present

## 2022-09-26 DIAGNOSIS — Z79899 Other long term (current) drug therapy: Secondary | ICD-10-CM

## 2022-09-26 DIAGNOSIS — I129 Hypertensive chronic kidney disease with stage 1 through stage 4 chronic kidney disease, or unspecified chronic kidney disease: Secondary | ICD-10-CM | POA: Diagnosis present

## 2022-09-26 DIAGNOSIS — I4891 Unspecified atrial fibrillation: Secondary | ICD-10-CM | POA: Diagnosis present

## 2022-09-26 DIAGNOSIS — M6281 Muscle weakness (generalized): Secondary | ICD-10-CM | POA: Diagnosis not present

## 2022-09-26 DIAGNOSIS — D62 Acute posthemorrhagic anemia: Secondary | ICD-10-CM | POA: Diagnosis not present

## 2022-09-26 DIAGNOSIS — R2689 Other abnormalities of gait and mobility: Secondary | ICD-10-CM | POA: Diagnosis not present

## 2022-09-26 DIAGNOSIS — Z833 Family history of diabetes mellitus: Secondary | ICD-10-CM

## 2022-09-26 DIAGNOSIS — Z79890 Hormone replacement therapy (postmenopausal): Secondary | ICD-10-CM

## 2022-09-26 DIAGNOSIS — E0800 Diabetes mellitus due to underlying condition with hyperosmolarity without nonketotic hyperglycemic-hyperosmolar coma (NKHHC): Secondary | ICD-10-CM

## 2022-09-26 DIAGNOSIS — E875 Hyperkalemia: Secondary | ICD-10-CM | POA: Diagnosis present

## 2022-09-26 DIAGNOSIS — D638 Anemia in other chronic diseases classified elsewhere: Secondary | ICD-10-CM

## 2022-09-26 DIAGNOSIS — S76111A Strain of right quadriceps muscle, fascia and tendon, initial encounter: Secondary | ICD-10-CM

## 2022-09-26 DIAGNOSIS — Z7985 Long-term (current) use of injectable non-insulin antidiabetic drugs: Secondary | ICD-10-CM

## 2022-09-26 DIAGNOSIS — Z96651 Presence of right artificial knee joint: Secondary | ICD-10-CM | POA: Diagnosis not present

## 2022-09-26 DIAGNOSIS — Z9181 History of falling: Secondary | ICD-10-CM | POA: Diagnosis not present

## 2022-09-26 DIAGNOSIS — Z6841 Body Mass Index (BMI) 40.0 and over, adult: Secondary | ICD-10-CM | POA: Diagnosis not present

## 2022-09-26 DIAGNOSIS — N179 Acute kidney failure, unspecified: Secondary | ICD-10-CM | POA: Diagnosis not present

## 2022-09-26 DIAGNOSIS — E119 Type 2 diabetes mellitus without complications: Secondary | ICD-10-CM

## 2022-09-26 DIAGNOSIS — E1122 Type 2 diabetes mellitus with diabetic chronic kidney disease: Secondary | ICD-10-CM | POA: Diagnosis present

## 2022-09-26 DIAGNOSIS — D72829 Elevated white blood cell count, unspecified: Secondary | ICD-10-CM | POA: Diagnosis not present

## 2022-09-26 DIAGNOSIS — Z91013 Allergy to seafood: Secondary | ICD-10-CM

## 2022-09-26 DIAGNOSIS — I2699 Other pulmonary embolism without acute cor pulmonale: Secondary | ICD-10-CM | POA: Diagnosis present

## 2022-09-26 DIAGNOSIS — S83411A Sprain of medial collateral ligament of right knee, initial encounter: Secondary | ICD-10-CM | POA: Diagnosis not present

## 2022-09-26 DIAGNOSIS — Z885 Allergy status to narcotic agent status: Secondary | ICD-10-CM

## 2022-09-26 DIAGNOSIS — Z91040 Latex allergy status: Secondary | ICD-10-CM

## 2022-09-26 DIAGNOSIS — R531 Weakness: Secondary | ICD-10-CM | POA: Diagnosis not present

## 2022-09-26 DIAGNOSIS — G8918 Other acute postprocedural pain: Secondary | ICD-10-CM | POA: Diagnosis not present

## 2022-09-26 DIAGNOSIS — M25361 Other instability, right knee: Secondary | ICD-10-CM | POA: Diagnosis not present

## 2022-09-26 DIAGNOSIS — S76111D Strain of right quadriceps muscle, fascia and tendon, subsequent encounter: Secondary | ICD-10-CM | POA: Diagnosis not present

## 2022-09-26 DIAGNOSIS — N182 Chronic kidney disease, stage 2 (mild): Secondary | ICD-10-CM | POA: Diagnosis present

## 2022-09-26 DIAGNOSIS — R41841 Cognitive communication deficit: Secondary | ICD-10-CM | POA: Diagnosis not present

## 2022-09-26 DIAGNOSIS — Z86711 Personal history of pulmonary embolism: Secondary | ICD-10-CM

## 2022-09-26 DIAGNOSIS — Z888 Allergy status to other drugs, medicaments and biological substances status: Secondary | ICD-10-CM

## 2022-09-26 DIAGNOSIS — Z7952 Long term (current) use of systemic steroids: Secondary | ICD-10-CM

## 2022-09-26 HISTORY — PX: QUADRICEPS TENDON REPAIR: SHX756

## 2022-09-26 HISTORY — PX: LIGAMENT REPAIR: SHX5444

## 2022-09-26 LAB — GLUCOSE, CAPILLARY
Glucose-Capillary: 67 mg/dL — ABNORMAL LOW (ref 70–99)
Glucose-Capillary: 116 mg/dL — ABNORMAL HIGH (ref 70–99)
Glucose-Capillary: 100 mg/dL — ABNORMAL HIGH (ref 70–99)
Glucose-Capillary: 100 mg/dL — ABNORMAL HIGH (ref 70–99)
Glucose-Capillary: 59 mg/dL — ABNORMAL LOW (ref 70–99)
Glucose-Capillary: 182 mg/dL — ABNORMAL HIGH (ref 70–99)

## 2022-09-26 LAB — CBC
HCT: 33.6 % — ABNORMAL LOW (ref 36.0–46.0)
Hemoglobin: 10.1 g/dL — ABNORMAL LOW (ref 12.0–15.0)
MCH: 28 pg (ref 26.0–34.0)
MCHC: 30.1 g/dL (ref 30.0–36.0)
MCV: 93.1 fL (ref 80.0–100.0)
Platelets: 179 10*3/uL (ref 150–400)
RBC: 3.61 MIL/uL — ABNORMAL LOW (ref 3.87–5.11)
RDW: 15.5 % (ref 11.5–15.5)
WBC: 7.4 10*3/uL (ref 4.0–10.5)
nRBC: 0 % (ref 0.0–0.2)

## 2022-09-26 LAB — CREATININE, SERUM
Creatinine, Ser: 1.06 mg/dL — ABNORMAL HIGH (ref 0.44–1.00)
GFR, Estimated: 58 mL/min — ABNORMAL LOW (ref >60)

## 2022-09-26 SURGERY — REPAIR, TENDON, QUADRICEPS
Anesthesia: General | Laterality: Right

## 2022-09-26 MED ORDER — OXYCODONE HCL 5 MG PO TABS
10.0000 mg | ORAL_TABLET | ORAL | Status: DC | PRN
  Administered 2022-09-27 – 2022-09-29 (×2): 10 mg via ORAL
  Administered 2022-09-29 (×2): 15 mg via ORAL
  Filled 2022-09-26: qty 2
  Filled 2022-09-26 (×2): qty 3

## 2022-09-26 MED ORDER — ALBUTEROL SULFATE (2.5 MG/3ML) 0.083% IN NEBU: 2.5000 mg | INHALATION_SOLUTION | Freq: Four times a day (QID) | RESPIRATORY_TRACT | Status: DC | PRN

## 2022-09-26 MED ORDER — METHOCARBAMOL 1000 MG/10ML IJ SOLN: 500.0000 mg | Freq: Four times a day (QID) | INTRAVENOUS | Status: DC | PRN

## 2022-09-26 MED ORDER — CEFAZOLIN SODIUM-DEXTROSE 2-4 GM/100ML-% IV SOLN
2.0000 g | INTRAVENOUS | Status: AC
  Administered 2022-09-26: 2 g via INTRAVENOUS
  Filled 2022-09-26: qty 100

## 2022-09-26 MED ORDER — LACTATED RINGERS IV SOLN: INTRAVENOUS | Status: DC

## 2022-09-26 MED ORDER — ONDANSETRON HCL 4 MG/2ML IJ SOLN
INTRAMUSCULAR | Status: DC | PRN
  Administered 2022-09-26: 4 mg via INTRAVENOUS

## 2022-09-26 MED ORDER — BUPIVACAINE HCL (PF) 0.25 % IJ SOLN
INTRAMUSCULAR | Status: AC
  Filled 2022-09-26: qty 30

## 2022-09-26 MED ORDER — MIDAZOLAM HCL 2 MG/2ML IJ SOLN
INTRAMUSCULAR | Status: AC
  Filled 2022-09-26: qty 2

## 2022-09-26 MED ORDER — ESCITALOPRAM OXALATE 10 MG PO TABS
20.0000 mg | ORAL_TABLET | Freq: Every day | ORAL | Status: DC
  Administered 2022-09-27 – 2022-10-04 (×8): 20 mg via ORAL
  Filled 2022-09-26 (×8): qty 2

## 2022-09-26 MED ORDER — CHLORHEXIDINE GLUCONATE 0.12 % MT SOLN
15.0000 mL | Freq: Once | OROMUCOSAL | Status: AC
  Administered 2022-09-26: 15 mL via OROMUCOSAL
  Filled 2022-09-26: qty 15

## 2022-09-26 MED ORDER — EPHEDRINE SULFATE-NACL 50-0.9 MG/10ML-% IV SOSY
PREFILLED_SYRINGE | INTRAVENOUS | Status: DC | PRN
  Administered 2022-09-26: 5 mg via INTRAVENOUS
  Administered 2022-09-26 (×2): 2.5 mg via INTRAVENOUS

## 2022-09-26 MED ORDER — INSULIN ASPART 100 UNIT/ML IJ SOLN
4.0000 [IU] | Freq: Three times a day (TID) | INTRAMUSCULAR | Status: DC
  Administered 2022-09-27 – 2022-10-04 (×21): 4 [IU] via SUBCUTANEOUS

## 2022-09-26 MED ORDER — LATANOPROST 0.005 % OP SOLN
1.0000 [drp] | Freq: Every day | OPHTHALMIC | Status: DC
  Administered 2022-09-26 – 2022-10-03 (×8): 1 [drp] via OPHTHALMIC
  Filled 2022-09-26 (×2): qty 2.5

## 2022-09-26 MED ORDER — DEXTROSE 50 % IV SOLN
INTRAVENOUS | Status: AC
  Administered 2022-09-26: 12.5 g via INTRAVENOUS
  Filled 2022-09-26: qty 50

## 2022-09-26 MED ORDER — MIDAZOLAM HCL 2 MG/2ML IJ SOLN: 0.5000 mg | Freq: Once | INTRAMUSCULAR | Status: DC | PRN

## 2022-09-26 MED ORDER — FOLIC ACID 1 MG PO TABS
2.0000 mg | ORAL_TABLET | Freq: Every day | ORAL | Status: DC
  Administered 2022-09-27 – 2022-10-04 (×8): 2 mg via ORAL
  Filled 2022-09-26 (×8): qty 2

## 2022-09-26 MED ORDER — HYDROMORPHONE HCL 1 MG/ML IJ SOLN
0.5000 mg | INTRAMUSCULAR | Status: DC | PRN
  Administered 2022-09-27 – 2022-10-01 (×7): 1 mg via INTRAVENOUS
  Filled 2022-09-26 (×7): qty 1

## 2022-09-26 MED ORDER — EZETIMIBE 10 MG PO TABS
10.0000 mg | ORAL_TABLET | Freq: Every day | ORAL | Status: DC
  Administered 2022-09-27 – 2022-10-04 (×8): 10 mg via ORAL
  Filled 2022-09-26 (×8): qty 1

## 2022-09-26 MED ORDER — VANCOMYCIN HCL 1000 MG IV SOLR
INTRAVENOUS | Status: DC | PRN
  Administered 2022-09-26: 1000 mg via TOPICAL

## 2022-09-26 MED ORDER — HYDROMORPHONE HCL 1 MG/ML IJ SOLN: 0.2500 mg | INTRAMUSCULAR | Status: DC | PRN

## 2022-09-26 MED ORDER — PROPOFOL 10 MG/ML IV BOLUS
INTRAVENOUS | Status: DC | PRN
  Administered 2022-09-26: 200 mg via INTRAVENOUS

## 2022-09-26 MED ORDER — INSULIN ASPART 100 UNIT/ML IJ SOLN
0.0000 [IU] | Freq: Three times a day (TID) | INTRAMUSCULAR | Status: DC
  Administered 2022-09-27 (×2): 2 [IU] via SUBCUTANEOUS
  Administered 2022-09-27 – 2022-09-29 (×5): 3 [IU] via SUBCUTANEOUS
  Administered 2022-09-30: 2 [IU] via SUBCUTANEOUS
  Administered 2022-09-30: 3 [IU] via SUBCUTANEOUS
  Administered 2022-10-01: 1 [IU] via SUBCUTANEOUS
  Administered 2022-10-01: 2 [IU] via SUBCUTANEOUS
  Administered 2022-10-01 – 2022-10-02 (×2): 3 [IU] via SUBCUTANEOUS
  Administered 2022-10-03 (×3): 2 [IU] via SUBCUTANEOUS
  Administered 2022-10-04: 3 [IU] via SUBCUTANEOUS
  Administered 2022-10-04: 2 [IU] via SUBCUTANEOUS

## 2022-09-26 MED ORDER — 0.9 % SODIUM CHLORIDE (POUR BTL) OPTIME
TOPICAL | Status: DC | PRN
  Administered 2022-09-26: 1000 mL

## 2022-09-26 MED ORDER — ACETAMINOPHEN 500 MG PO TABS
1000.0000 mg | ORAL_TABLET | Freq: Three times a day (TID) | ORAL | Status: DC
  Administered 2022-09-26 – 2022-09-30 (×11): 1000 mg via ORAL
  Filled 2022-09-26 (×11): qty 2

## 2022-09-26 MED ORDER — FENTANYL CITRATE (PF) 250 MCG/5ML IJ SOLN
INTRAMUSCULAR | Status: DC | PRN
  Administered 2022-09-26 (×3): 25 ug via INTRAVENOUS

## 2022-09-26 MED ORDER — HYDRALAZINE HCL 50 MG PO TABS
50.0000 mg | ORAL_TABLET | Freq: Three times a day (TID) | ORAL | Status: DC
  Administered 2022-09-26 – 2022-10-01 (×14): 50 mg via ORAL
  Filled 2022-09-26 (×14): qty 1

## 2022-09-26 MED ORDER — MAGNESIUM CITRATE PO SOLN: 1.0000 | Freq: Once | ORAL | Status: DC | PRN

## 2022-09-26 MED ORDER — FENTANYL CITRATE (PF) 100 MCG/2ML IJ SOLN
INTRAMUSCULAR | Status: AC
  Administered 2022-09-26: 100 ug via INTRAVENOUS
  Filled 2022-09-26: qty 2

## 2022-09-26 MED ORDER — IPRATROPIUM BROMIDE 0.06 % NA SOLN: 1.0000 | Freq: Three times a day (TID) | NASAL | Status: DC | PRN

## 2022-09-26 MED ORDER — ZOLPIDEM TARTRATE 5 MG PO TABS
5.0000 mg | ORAL_TABLET | Freq: Every evening | ORAL | Status: DC | PRN
  Administered 2022-10-01 – 2022-10-02 (×2): 5 mg via ORAL
  Filled 2022-09-26 (×2): qty 1

## 2022-09-26 MED ORDER — POLYETHYLENE GLYCOL 3350 17 G PO PACK: 17.0000 g | PACK | Freq: Every day | ORAL | Status: DC | PRN

## 2022-09-26 MED ORDER — VANCOMYCIN HCL 1000 MG IV SOLR
INTRAVENOUS | Status: AC
  Filled 2022-09-26: qty 20

## 2022-09-26 MED ORDER — DEXTROSE 50 % IV SOLN
25.0000 mL | Freq: Once | INTRAVENOUS | Status: AC
  Administered 2022-09-26: 25 mL via INTRAVENOUS

## 2022-09-26 MED ORDER — VITAMIN B-12 1000 MCG PO TABS
1000.0000 ug | ORAL_TABLET | Freq: Every day | ORAL | Status: DC
  Administered 2022-09-27 – 2022-10-01 (×5): 1000 ug via ORAL
  Filled 2022-09-26 (×5): qty 1

## 2022-09-26 MED ORDER — GABAPENTIN 300 MG PO CAPS
300.0000 mg | ORAL_CAPSULE | Freq: Once | ORAL | Status: AC
  Administered 2022-09-26: 300 mg via ORAL
  Filled 2022-09-26: qty 1

## 2022-09-26 MED ORDER — PREDNISONE 10 MG PO TABS
10.0000 mg | ORAL_TABLET | Freq: Every day | ORAL | Status: DC
  Administered 2022-09-26 – 2022-09-30 (×5): 10 mg via ORAL
  Filled 2022-09-26 (×5): qty 1

## 2022-09-26 MED ORDER — SULFASALAZINE 500 MG PO TABS
1500.0000 mg | ORAL_TABLET | Freq: Two times a day (BID) | ORAL | Status: DC
  Administered 2022-09-26 – 2022-10-04 (×16): 1500 mg via ORAL
  Filled 2022-09-26 (×17): qty 3

## 2022-09-26 MED ORDER — METOPROLOL SUCCINATE ER 25 MG PO TB24
25.0000 mg | ORAL_TABLET | Freq: Every day | ORAL | Status: DC
  Administered 2022-09-27 – 2022-10-04 (×8): 25 mg via ORAL
  Filled 2022-09-26 (×8): qty 1

## 2022-09-26 MED ORDER — TOBRAMYCIN SULFATE 1.2 G IJ SOLR
INTRAMUSCULAR | Status: DC | PRN
  Administered 2022-09-26: 1.2 g via TOPICAL

## 2022-09-26 MED ORDER — FENTANYL CITRATE (PF) 250 MCG/5ML IJ SOLN
INTRAMUSCULAR | Status: AC
  Filled 2022-09-26: qty 5

## 2022-09-26 MED ORDER — ATORVASTATIN CALCIUM 40 MG PO TABS
40.0000 mg | ORAL_TABLET | Freq: Every day | ORAL | Status: DC
  Administered 2022-09-27 – 2022-10-04 (×8): 40 mg via ORAL
  Filled 2022-09-26 (×8): qty 1

## 2022-09-26 MED ORDER — OXYCODONE HCL 5 MG/5ML PO SOLN: 5.0000 mg | Freq: Once | ORAL | Status: DC | PRN

## 2022-09-26 MED ORDER — METOCLOPRAMIDE HCL 5 MG PO TABS: 5.0000 mg | ORAL_TABLET | Freq: Three times a day (TID) | ORAL | Status: DC | PRN

## 2022-09-26 MED ORDER — TOBRAMYCIN SULFATE 1.2 G IJ SOLR
INTRAMUSCULAR | Status: AC
  Filled 2022-09-26: qty 1.2

## 2022-09-26 MED ORDER — VITAMIN D 25 MCG (1000 UNIT) PO TABS
5000.0000 [IU] | ORAL_TABLET | Freq: Every day | ORAL | Status: DC
  Administered 2022-09-27 – 2022-10-04 (×8): 5000 [IU] via ORAL
  Filled 2022-09-26 (×8): qty 5

## 2022-09-26 MED ORDER — ONDANSETRON HCL 4 MG/2ML IJ SOLN
4.0000 mg | Freq: Four times a day (QID) | INTRAMUSCULAR | Status: DC | PRN
  Administered 2022-09-29: 4 mg via INTRAVENOUS
  Filled 2022-09-26: qty 2

## 2022-09-26 MED ORDER — IRBESARTAN 300 MG PO TABS
300.0000 mg | ORAL_TABLET | Freq: Every day | ORAL | Status: DC
  Administered 2022-09-27 – 2022-10-02 (×6): 300 mg via ORAL
  Filled 2022-09-26 (×6): qty 1

## 2022-09-26 MED ORDER — ORAL CARE MOUTH RINSE: 15.0000 mL | Freq: Once | OROMUCOSAL | Status: AC

## 2022-09-26 MED ORDER — KETOROLAC TROMETHAMINE 15 MG/ML IJ SOLN
7.5000 mg | Freq: Four times a day (QID) | INTRAMUSCULAR | Status: AC
  Administered 2022-09-26 – 2022-09-27 (×4): 7.5 mg via INTRAVENOUS
  Filled 2022-09-26 (×3): qty 1

## 2022-09-26 MED ORDER — MIDAZOLAM HCL 2 MG/2ML IJ SOLN: 2.0000 mg | Freq: Once | INTRAMUSCULAR | Status: AC

## 2022-09-26 MED ORDER — BUPIVACAINE-EPINEPHRINE (PF) 0.5% -1:200000 IJ SOLN
INTRAMUSCULAR | Status: DC | PRN
  Administered 2022-09-26: 30 mL via PERINEURAL

## 2022-09-26 MED ORDER — ACETAMINOPHEN 500 MG PO TABS
1000.0000 mg | ORAL_TABLET | Freq: Once | ORAL | Status: AC
  Administered 2022-09-26: 1000 mg via ORAL
  Filled 2022-09-26: qty 2

## 2022-09-26 MED ORDER — BISACODYL 5 MG PO TBEC: 5.0000 mg | DELAYED_RELEASE_TABLET | Freq: Every day | ORAL | Status: DC | PRN

## 2022-09-26 MED ORDER — ACETAMINOPHEN 325 MG PO TABS: 325.0000 mg | ORAL_TABLET | Freq: Four times a day (QID) | ORAL | Status: DC | PRN

## 2022-09-26 MED ORDER — CEFAZOLIN SODIUM-DEXTROSE 2-4 GM/100ML-% IV SOLN
2.0000 g | Freq: Four times a day (QID) | INTRAVENOUS | Status: AC
  Administered 2022-09-26 – 2022-09-27 (×3): 2 g via INTRAVENOUS
  Filled 2022-09-26 (×3): qty 100

## 2022-09-26 MED ORDER — FENTANYL CITRATE (PF) 100 MCG/2ML IJ SOLN: 100.0000 ug | Freq: Once | INTRAMUSCULAR | Status: AC

## 2022-09-26 MED ORDER — LEVOTHYROXINE SODIUM 25 MCG PO TABS
125.0000 ug | ORAL_TABLET | ORAL | Status: DC
  Administered 2022-09-27 – 2022-10-04 (×6): 125 ug via ORAL
  Filled 2022-09-26 (×6): qty 1

## 2022-09-26 MED ORDER — INSULIN GLARGINE-YFGN 100 UNIT/ML ~~LOC~~ SOLN
42.0000 [IU] | Freq: Every day | SUBCUTANEOUS | Status: DC
  Administered 2022-09-26 – 2022-10-03 (×8): 42 [IU] via SUBCUTANEOUS
  Filled 2022-09-26 (×9): qty 0.42

## 2022-09-26 MED ORDER — METHOCARBAMOL 500 MG PO TABS
500.0000 mg | ORAL_TABLET | Freq: Four times a day (QID) | ORAL | Status: DC | PRN
  Administered 2022-09-27 – 2022-09-30 (×6): 500 mg via ORAL
  Filled 2022-09-26 (×6): qty 1

## 2022-09-26 MED ORDER — PROMETHAZINE HCL 25 MG/ML IJ SOLN: 6.2500 mg | INTRAMUSCULAR | Status: DC | PRN

## 2022-09-26 MED ORDER — DEXTROSE 50 % IV SOLN
12.5000 g | INTRAVENOUS | Status: AC
  Filled 2022-09-26: qty 50

## 2022-09-26 MED ORDER — METOCLOPRAMIDE HCL 5 MG/ML IJ SOLN: 5.0000 mg | Freq: Three times a day (TID) | INTRAMUSCULAR | Status: DC | PRN

## 2022-09-26 MED ORDER — LIDOCAINE 2% (20 MG/ML) 5 ML SYRINGE
INTRAMUSCULAR | Status: DC | PRN
  Administered 2022-09-26: 30 mg via INTRAVENOUS

## 2022-09-26 MED ORDER — OXYCODONE HCL 5 MG PO TABS
5.0000 mg | ORAL_TABLET | ORAL | Status: DC | PRN
  Administered 2022-09-27 – 2022-09-29 (×4): 10 mg via ORAL
  Filled 2022-09-26 (×5): qty 2

## 2022-09-26 MED ORDER — ONDANSETRON HCL 4 MG PO TABS
4.0000 mg | ORAL_TABLET | Freq: Four times a day (QID) | ORAL | Status: DC | PRN
  Administered 2022-10-02: 4 mg via ORAL
  Filled 2022-09-26: qty 1

## 2022-09-26 MED ORDER — DOCUSATE SODIUM 100 MG PO CAPS
100.0000 mg | ORAL_CAPSULE | Freq: Two times a day (BID) | ORAL | Status: DC
  Administered 2022-09-27 – 2022-10-04 (×13): 100 mg via ORAL
  Filled 2022-09-26 (×16): qty 1

## 2022-09-26 MED ORDER — ENOXAPARIN SODIUM 40 MG/0.4ML IJ SOSY: 40.0000 mg | PREFILLED_SYRINGE | INTRAMUSCULAR | Status: DC

## 2022-09-26 MED ORDER — MIDAZOLAM HCL 2 MG/2ML IJ SOLN
INTRAMUSCULAR | Status: AC
  Administered 2022-09-26: 2 mg via INTRAVENOUS
  Filled 2022-09-26: qty 2

## 2022-09-26 MED ORDER — AMLODIPINE BESYLATE 10 MG PO TABS
10.0000 mg | ORAL_TABLET | Freq: Every day | ORAL | Status: DC
  Administered 2022-09-27 – 2022-10-04 (×8): 10 mg via ORAL
  Filled 2022-09-26 (×8): qty 1

## 2022-09-26 MED ORDER — LORATADINE 10 MG PO TABS
10.0000 mg | ORAL_TABLET | Freq: Every day | ORAL | Status: DC
  Administered 2022-09-27 – 2022-10-04 (×8): 10 mg via ORAL
  Filled 2022-09-26 (×8): qty 1

## 2022-09-26 MED ORDER — KETOROLAC TROMETHAMINE 15 MG/ML IJ SOLN
INTRAMUSCULAR | Status: AC
  Filled 2022-09-26: qty 1

## 2022-09-26 MED ORDER — PHENYLEPHRINE HCL-NACL 20-0.9 MG/250ML-% IV SOLN
INTRAVENOUS | Status: DC | PRN
  Administered 2022-09-26: 30 ug/min via INTRAVENOUS

## 2022-09-26 MED ORDER — DEXAMETHASONE SODIUM PHOSPHATE 10 MG/ML IJ SOLN
INTRAMUSCULAR | Status: DC | PRN
  Administered 2022-09-26: 4 mg via INTRAVENOUS

## 2022-09-26 MED ORDER — MEPERIDINE HCL 25 MG/ML IJ SOLN: 6.2500 mg | INTRAMUSCULAR | Status: DC | PRN

## 2022-09-26 MED ORDER — ENOXAPARIN SODIUM 100 MG/ML IJ SOSY
100.0000 mg | PREFILLED_SYRINGE | Freq: Two times a day (BID) | INTRAMUSCULAR | Status: DC
  Filled 2022-09-26 (×2): qty 1

## 2022-09-26 MED ORDER — POTASSIUM CHLORIDE 2 MEQ/ML IV SOLN
INTRAVENOUS | Status: DC
  Filled 2022-09-26: qty 1000

## 2022-09-26 MED ORDER — OXYCODONE HCL 5 MG PO TABS: 5.0000 mg | ORAL_TABLET | Freq: Once | ORAL | Status: DC | PRN

## 2022-09-26 MED ORDER — DIPHENHYDRAMINE HCL 12.5 MG/5ML PO ELIX: 12.5000 mg | ORAL_SOLUTION | ORAL | Status: DC | PRN

## 2022-09-26 MED ORDER — EPHEDRINE 5 MG/ML INJ
INTRAVENOUS | Status: AC
  Filled 2022-09-26: qty 5

## 2022-09-26 SURGICAL SUPPLY — 81 items
ANCH SUT 2 FT CRKSCW 14.7 STRL (Anchor) ×2 IMPLANT
ANCH SUT SWLK 19.1X4.75 (Anchor) ×1 IMPLANT
ANCHOR CORKSCREW BIO 5.5 FT (Anchor) IMPLANT
ANCHOR SUT BIO SW 4.75X19.1 (Anchor) IMPLANT
BAG COUNTER SPONGE SURGICOUNT (BAG) ×2 IMPLANT
BAG SPNG CNTER NS LX DISP (BAG) ×1
BANDAGE ESMARK 6X9 LF (GAUZE/BANDAGES/DRESSINGS) ×2 IMPLANT
BIT DRILL 5/64X5 DISP (BIT) ×2 IMPLANT
BLADE SURG 10 STRL SS (BLADE) ×2 IMPLANT
BNDG CMPR 9X6 STRL LF SNTH (GAUZE/BANDAGES/DRESSINGS)
BNDG CMPR MED 15X6 ELC VLCR LF (GAUZE/BANDAGES/DRESSINGS) ×1
BNDG COHESIVE 4X5 TAN STRL (GAUZE/BANDAGES/DRESSINGS) ×2 IMPLANT
BNDG ELASTIC 4X5.8 VLCR STR LF (GAUZE/BANDAGES/DRESSINGS) ×2 IMPLANT
BNDG ELASTIC 6X15 VLCR STRL LF (GAUZE/BANDAGES/DRESSINGS) IMPLANT
BNDG ELASTIC 6X5.8 VLCR STR LF (GAUZE/BANDAGES/DRESSINGS) ×2 IMPLANT
BNDG ESMARK 6X9 LF (GAUZE/BANDAGES/DRESSINGS)
BNDG GAUZE DERMACEA FLUFF 4 (GAUZE/BANDAGES/DRESSINGS) ×2 IMPLANT
BNDG GZE DERMACEA 4 6PLY (GAUZE/BANDAGES/DRESSINGS)
COVER MAYO STAND STRL (DRAPES) ×2 IMPLANT
COVER SURGICAL LIGHT HANDLE (MISCELLANEOUS) ×2 IMPLANT
DRAPE IMP U-DRAPE 54X76 (DRAPES) IMPLANT
DRAPE INCISE IOBAN 66X45 STRL (DRAPES) ×2 IMPLANT
DRAPE U-SHAPE 47X51 STRL (DRAPES) ×2 IMPLANT
ELECT REM PT RETURN 9FT ADLT (ELECTROSURGICAL) ×1
ELECTRODE REM PT RTRN 9FT ADLT (ELECTROSURGICAL) ×2 IMPLANT
GAUZE PAD ABD 8X10 STRL (GAUZE/BANDAGES/DRESSINGS) ×4 IMPLANT
GAUZE SPONGE 4X4 12PLY STRL (GAUZE/BANDAGES/DRESSINGS) ×2 IMPLANT
GAUZE XEROFORM 1X8 LF (GAUZE/BANDAGES/DRESSINGS) IMPLANT
GAUZE XEROFORM 5X9 LF (GAUZE/BANDAGES/DRESSINGS) ×2 IMPLANT
GLOVE BIOGEL PI IND STRL 8 (GLOVE) ×2 IMPLANT
GLOVE ECLIPSE 8.0 STRL XLNG CF (GLOVE) ×2 IMPLANT
GOWN STRL REUS W/ TWL LRG LVL3 (GOWN DISPOSABLE) ×2 IMPLANT
GOWN STRL REUS W/ TWL XL LVL3 (GOWN DISPOSABLE) ×2 IMPLANT
GOWN STRL REUS W/TWL LRG LVL3 (GOWN DISPOSABLE) ×1
GOWN STRL REUS W/TWL XL LVL3 (GOWN DISPOSABLE) ×1
GRAFT TISS 40X70 3 THK DERM (Tissue) IMPLANT
IMMOBILIZER KNEE 22 UNIV (SOFTGOODS) ×2 IMPLANT
KIT BASIN OR (CUSTOM PROCEDURE TRAY) ×2 IMPLANT
KIT BIO-TENODESIS 3X8 DISP (MISCELLANEOUS) ×1
KIT INSRT BABSR STRL DISP BTN (MISCELLANEOUS) IMPLANT
KIT TURNOVER KIT B (KITS) ×2 IMPLANT
MANIFOLD NEPTUNE II (INSTRUMENTS) ×2 IMPLANT
NDL 18GX1X1/2 (RX/OR ONLY) (NEEDLE) ×2 IMPLANT
NDL SPNL 18GX3.5 QUINCKE PK (NEEDLE) ×2 IMPLANT
NDL SUT 2 .5 CRC MAYO 1.732X (NEEDLE) IMPLANT
NEEDLE 18GX1X1/2 (RX/OR ONLY) (NEEDLE) ×1 IMPLANT
NEEDLE MAYO TAPER (NEEDLE) ×1
NEEDLE SPNL 18GX3.5 QUINCKE PK (NEEDLE) ×1 IMPLANT
NS IRRIG 1000ML POUR BTL (IV SOLUTION) ×2 IMPLANT
PACK ORTHO EXTREMITY (CUSTOM PROCEDURE TRAY) ×2 IMPLANT
PAD ARMBOARD 7.5X6 YLW CONV (MISCELLANEOUS) ×4 IMPLANT
PAD CAST 4YDX4 CTTN HI CHSV (CAST SUPPLIES) ×2 IMPLANT
PADDING CAST COTTON 4X4 STRL (CAST SUPPLIES) ×1
PADDING CAST COTTON 6X4 STRL (CAST SUPPLIES) ×2 IMPLANT
PASSER SUT SWANSON 36MM LOOP (INSTRUMENTS) ×2 IMPLANT
PENCIL BUTTON HOLSTER BLD 10FT (ELECTRODE) ×2 IMPLANT
SPLINT PLASTER CAST XFAST 5X30 (CAST SUPPLIES) IMPLANT
SPONGE T-LAP 18X18 ~~LOC~~+RFID (SPONGE) ×4 IMPLANT
STAPLER VISISTAT 35W (STAPLE) ×2 IMPLANT
STOCKINETTE IMPERVIOUS 9X36 MD (GAUZE/BANDAGES/DRESSINGS) ×2 IMPLANT
SUT ETHILON 2 0 FS 18 (SUTURE) IMPLANT
SUT FIBERWIRE #2 38 T-5 BLUE (SUTURE)
SUT VIC AB 0 CT1 27 (SUTURE) ×4
SUT VIC AB 0 CT1 27XBRD ANBCTR (SUTURE) ×4 IMPLANT
SUT VIC AB 1 CT1 18XCR BRD 8 (SUTURE) IMPLANT
SUT VIC AB 1 CT1 27 (SUTURE) ×3
SUT VIC AB 1 CT1 27XBRD ANBCTR (SUTURE) ×2 IMPLANT
SUT VIC AB 1 CT1 8-18 (SUTURE) ×1
SUT VIC AB 2-0 CT1 27 (SUTURE) ×2
SUT VIC AB 2-0 CT1 TAPERPNT 27 (SUTURE) ×4 IMPLANT
SUT VIC AB 3-0 SH 27 (SUTURE) ×2
SUT VIC AB 3-0 SH 27X BRD (SUTURE) IMPLANT
SUT VICRYL AB 2 0 TIES (SUTURE) ×2 IMPLANT
SUTURE FIBERWR #2 38 T-5 BLUE (SUTURE) IMPLANT
SYR CONTROL 10ML LL (SYRINGE) ×2 IMPLANT
SYR TB 1ML LUER SLIP (SYRINGE) ×2 IMPLANT
TISSUE ARTHOFLEX THICK 3MM (Tissue) ×1 IMPLANT
TOWEL GREEN STERILE (TOWEL DISPOSABLE) ×2 IMPLANT
TUBE CONNECTING 12X1/4 (SUCTIONS) ×2 IMPLANT
WATER STERILE IRR 1000ML POUR (IV SOLUTION) ×2 IMPLANT
YANKAUER SUCT BULB TIP NO VENT (SUCTIONS) ×2 IMPLANT

## 2022-09-26 NOTE — Anesthesia Postprocedure Evaluation (Signed)
Anesthesia Post Note  Patient: CARREEN MILIUS  Procedure(s) Performed: REVISION REPAIR QUADRICEP TENDON (Right) LIGAMENT REPAIR/knee (Right)     Patient location during evaluation: PACU Anesthesia Type: General and Regional Level of consciousness: awake and alert, oriented and patient cooperative Pain management: pain level controlled Vital Signs Assessment: post-procedure vital signs reviewed and stable Respiratory status: spontaneous breathing, nonlabored ventilation and respiratory function stable Cardiovascular status: blood pressure returned to baseline and stable Postop Assessment: no apparent nausea or vomiting Anesthetic complications: no   No notable events documented.  Last Vitals:  Vitals:   09/26/22 1700 09/26/22 1730  BP: (!) 147/76 (!) 157/74  Pulse: 62 (!) 53  Resp: 15 11  Temp:  36.6 C  SpO2: 92% 98%    Last Pain:  Vitals:   09/26/22 1730  TempSrc:   PainSc: 0-No pain        RLE Motor Response: No movement due to regional block (09/26/22 1730) RLE Sensation: Numbness (09/26/22 1730)      Mervin Ramires,E. Jowana Thumma

## 2022-09-26 NOTE — Interval H&P Note (Signed)
All questions answered

## 2022-09-26 NOTE — Progress Notes (Signed)
ANTICOAGULATION CONSULT NOTE - Initial Consult  Pharmacy Consult for Lovenox Indication: pulmonary embolus  Allergies  Allergen Reactions   Semaglutide(0.25 Or 0.66m-Dos) Other (See Comments)    Causes cataracts requiring surgery and impairing vision    Shellfish-Derived Products Anaphylaxis, Swelling and Other (See Comments)    Patient avoids   Empagliflozin Itching, Swelling and Other (See Comments)    UTI sx's and yeast infections   Rho (D) Immune Globulin      Not available   Tramadol Hcl Other (See Comments)    hallucinations   Canagliflozin Other (See Comments)    Urinary incontinence    Latex Rash   Liraglutide Itching    Other reaction(s): itching all over   Minoxidil Rash    skin irritation   Omeprazole Rash   Percocet [Oxycodone-Acetaminophen] Nausea And Vomiting    Patient Measurements: Height: 5' 4.5" (163.8 cm) Weight: 108.9 kg (240 lb) IBW/kg (Calculated) : 55.85 Heparin Dosing Weight:   Vital Signs: Temp: 97.9 F (36.6 C) (11/29 1650) Temp Source: Oral (11/29 1303) BP: 160/72 (11/29 1650) Pulse Rate: 56 (11/29 1650)  Labs: No results for input(s): "HGB", "HCT", "PLT", "APTT", "LABPROT", "INR", "HEPARINUNFRC", "HEPRLOWMOCWT", "CREATININE", "CKTOTAL", "CKMB", "TROPONINIHS" in the last 72 hours.  Estimated Creatinine Clearance: 62.4 mL/min (A) (by C-G formula based on SCr of 1.08 mg/dL (H)).   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   09/25/2022   Aflibercept (EYLEA) 2 MG/0.05ML SOLN 2 mg by Intravitreal route every 3 (three) months.   Past Month   albuterol (VENTOLIN HFA) 108 (90 Base) MCG/ACT inhaler Inhale two puffs every four to six hours as needed for cough or wheeze. (Patient taking differently: Inhale 1-2 puffs into the lungs every 6 (six) hours as needed for wheezing.) 18 g 2 Past Month   amLODipine (NORVASC) 10 MG tablet Take 1 tablet (10 mg total) by mouth daily.   09/25/2022   APIXABAN (ELIQUIS) VTE STARTER PACK (10MG AND 5MG) Take as directed on package: start with two-552mtablets twice daily for 7 days. On day 8, switch to one-93m27mablet twice daily. 74 tablet 0 09/23/2022   atorvastatin (LIPITOR) 40 MG tablet Take 1 tablet (40 mg total) by mouth daily at 6 PM. (Patient taking differently: Take 40 mg by mouth daily.) 30 tablet 0    CHELATED IRON PO Take 18 mg by mouth daily.   Past Week   Cholecalciferol (VITAMIN D3) 125 MCG (5000 UT) CAPS Take 5,000 Units by mouth daily.   Past Week   ciclopirox (PENLAC) 8 % solution Apply topically at bedtime. Apply over nail and surrounding skin.  Apply daily over previous coat. After seven (7) days, may remove with alcohol and continue cycle. 6.6 mL 0    Dulaglutide (TRULICITY) 1.5 ML/4.6TK SOPN Inject 1.5 mg into the skin every Tuesday.   09/18/2022   enoxaparin (LOVENOX) 150 MG/ML injection Inject 1 mL (150 mg total) into the skin every 12 (twelve) hours. Self administer twice a day for 2 days starting 09/24/22. 4 mL 0 09/25/2022 at 1900   EPINEPHrine (EPIPEN 2-PAK) 0.3 mg/0.3 mL IJ SOAJ injection Use as directed for life-threatening allergic reaction. 2 each 1    epoetin alfa-epbx (RETACRIT) 2000 UNIT/ML injection See admin instructions.      escitalopram (LEXAPRO) 20  MG tablet Take 20 mg by mouth daily.   09/26/2022 at 0800   ezetimibe (ZETIA) 10 MG tablet Take 1 tablet (10 mg total) by mouth daily.   09/26/2022 at 0800   fexofenadine (ALLEGRA ALLERGY) 180 MG tablet Take 1 tablet (180 mg total) by mouth daily.   35/46/5681 at 2751   folic acid (FOLVITE) 1 MG tablet Take 2 mg by mouth daily.   Past Week   hydrALAZINE (APRESOLINE) 50 MG tablet Take 50 mg by mouth 3 (three) times daily.   09/26/2022 at 0800   HYDROcodone-acetaminophen (NORCO) 5-325 MG tablet Take 1 tablet by mouth every 6 (six) hours as needed for severe pain. 15 tablet 0 Past Week   hydrocortisone cream 1 % Apply 1 Application topically 2 (two) times daily as needed for itching.   Past Week   insulin aspart (NOVOLOG) 100 UNIT/ML FlexPen Inject 20 Units into the skin See admin instructions. Inject 20 units into the skin two to three times a day before meals   09/25/2022   insulin glargine, 2 Unit Dial, (TOUJEO MAX SOLOSTAR) 300 UNIT/ML Solostar Pen Inject 46 Units into the skin daily. (Patient taking differently: Inject 42 Units into the skin at bedtime.)   09/25/2022   ipratropium (ATROVENT) 0.06 % nasal spray Place 1-2 sprays each nostril as needed up to 3 times a day (Patient taking differently: Place 1-2 sprays into both nostrils 3 (three) times daily as needed for rhinitis.) 15 mL 5 not taking   irbesartan (AVAPRO) 300 MG tablet Take 1 tablet (300 mg total) by mouth daily.   09/25/2022   latanoprost (XALATAN) 0.005 % ophthalmic solution Place 1 drop into both eyes at bedtime.   Past Week   lidocaine (LMX) 4 % cream Apply 1 Application topically as needed (pain).   Past Week   Methotrexate Sodium (METHOTREXATE, PF,) 50 MG/2ML injection Inject 20 mg into the muscle every Wednesday. 0.8 ml   09/18/2022   metoprolol succinate (TOPROL-XL) 25 MG 24 hr tablet Take 1 tablet (25 mg total) by mouth daily. May take additional half tablet as needed once daily for palpitations. (Patient taking differently:  Take 12.5-25 mg by mouth See admin instructions. Take 25 mg by mouth once a day and an additional 12.5 mg (half tablet) as needed once daily for palpitations) 135 tablet 3 09/26/2022 at 0800   Multiple Vitamins-Minerals (CENTRUM SILVER PO) Take 1 tablet by mouth daily with breakfast.   Past Week   predniSONE (DELTASONE) 10 MG tablet Take 10 mg by mouth daily.   09/25/2022   sulfaSALAzine (AZULFIDINE) 500 MG tablet Take 3 tablets (1,500 mg total) by mouth 2 (two) times daily.   Past Week   SYNTHROID 125 MCG tablet Take 125 mcg by mouth every Monday, Tuesday, Wednesday, Thursday, and Friday.   09/26/2022 at 0800  triamcinolone cream (KENALOG) 0.1 % Apply 1 Application topically 3 (three) times daily as needed (for itching- affected areas).      vitamin B-12 (CYANOCOBALAMIN) 1000 MCG tablet Take 1,000 mcg by mouth daily.   Past Week   Accu-Chek Softclix Lancets lancets use to check blood sugar      B-D ULTRAFINE III SHORT PEN 31G X 8 MM MISC SMARTSIG:1 Each SUB-Q Daily      Lancets Misc. (ACCU-CHEK SOFTCLIX LANCET DEV) KIT See admin instructions.      metaxalone (SKELAXIN) 800 MG tablet Take 800 mg by mouth 3 (three) times daily as needed for muscle spasms.   More than a month   Scheduled:   [START ON 09/27/2022] enoxaparin (LOVENOX) injection  100 mg Subcutaneous Q12H   ketorolac  7.5 mg Intravenous Q6H   Infusions:   lactated ringers 10 mL/hr at 09/26/22 1413   methocarbamol (ROBAXIN) IV      Assessment: Pt with recent PE in 10/23. She has been on Xarelto prior to conversion to Lovenox for surgery bridging. She is s/p Revision quadriceps repair with allograft 11/29. Ok to start Lovenox in AM per Dr. Griffin Basil and continue while here.   Scr 1.08 Hgb 10.5, plt wnl  Goal of Therapy:  Anti-Xa level 0.6-1 units/ml 4hrs after LMWH dose given Monitor platelets by anticoagulation protocol: Yes   Plan:  Lovenox 172m SQ BID starting 11/30 Cbc q3d  MOnnie Boer PharmD, [place] AAHIV[place],  CPP Infectious Disease Pharmacist 09/26/2022 5:16 PM

## 2022-09-26 NOTE — Op Note (Signed)
Orthopaedic Surgery Operative Note (CSN: [id number])  MIRABEL AHLGREN  [birth year] Date of Surgery: 09/26/2022   Diagnoses:  Recurrent rupture of the quadriceps tendon  Procedure: Revision quadriceps repair with allograft Medial retinacular repair Lateral retinacular repair   Operative Finding Successful completion of the planned procedure.  Patient's anchors was still in the tissue however the head broken off the proximal aspect of the patella as they had avulsed likely during one of her falls.  The tissue was able to be reapproximated to the patella and we internally braced it with a fiber tape.  We additionally added an Arthrex graft as we had good bone to tendon contact we felt that augmenting would be appropriate.  I have significant concerns with the patient's ability to mobilize and protect this.  She is in the 3 sided splint currently.  There is consideration to put her in an external fixator secondary to her body habitus and her inability to protect the leg with bracing alone.  Post-operative plan: The patient will be nonweightbearing in a 3 sided long-leg splint.  The patient will be admitted to the hospital for medical monitoring due to complexity and inability to mobilize would likely discharge to a skilled nursing facility.  DVT prophylaxis not indicated as patient already on full dose anticoagulant.   Pain control with PRN pain medication preferring oral medicines.  Follow up plan will be scheduled in approximately 14 days for incision check and XR.  Post-Op Diagnosis: Same Surgeons:Primary: Hiram Gash, MD Assistants:Caroline McBane PA-C Location: [facility] OR ROOM 09 Anesthesia: General with regional anesthesia Antibiotics: Ancef 2 g with local vancomycin powder 1 g at the surgical site, 1.2 tobramycin powder Tourniquet time: None Estimated Blood Loss: 20 Complications: None Specimens: None Implants: Implant Name Type Inv. Item Serial No. Manufacturer Lot No. LRB No. Used Action   ANCHOR CORKSCREW BIO 5.5 FT - [id number] Anchor ANCHOR CORKSCREW BIO 5.5 FT  [employer] [id number] Right 1 Implanted  ANCHOR CORKSCREW BIO 5.5 FT - [id number] Anchor ANCHOR CORKSCREW BIO 5.5 FT  [employer] [id number] Right 1 Implanted  ANCHOR SUT BIO SW 4.75X19.1 - [id number] Anchor ANCHOR SUT BIO SW 4.75X19.1  [employer] [id number] Right 1 Implanted  TISSUE ARTHOFLEX THICK 3MM - [id number] Tissue TISSUE Corrinne Eagle 3MM [id number] [place] [id number] Right 1 Implanted    Indications for Surgery:   AHNIKA HANNIBAL is a 66 y.o. female with previous quadriceps repair however she had multiple falls and was unable to mobilize safely.  She had a recurrent tear of the quadriceps benefits and risks of operative and nonoperative management were discussed prior to surgery with patient/guardian(s) and informed consent form was completed.  Specific risks including infection, need for additional surgery, wound issues, continued insufficiency of extensor mechanism, stiffness amongst others.   Procedure:   The patient was identified properly. Informed consent was obtained and the surgical site was marked. The patient was taken up to suite where general anesthesia was induced.  The patient was positioned prone on a regular bed.  The right knee was prepped and draped in the usual sterile fashion.  Timeout was performed before the beginning of the case.  We used the previous incision on the anterior aspect of the knee.  Went through skin sharp achieving hemostasis we progressed.  Full-thickness skin flaps were lifted.  We identified and encountered the hematoma/seroma at the site of the rerupture.  The fiber tack anchors were still in the proximal quadriceps tissue however they had avulsed off  a small piece of bone as they had failed in unison.  This likely came unfortunately due to the patient's multiple falls.  We took out multiple sutures in the anchors.  At that point we freshen the ends of the tissue  as well as the bone.  We drilled with a 4 mm Bio-Tenodesis reamer in the level of the patella making 3 tunnels.  The medial and lateral most tunnel were filled with a 5.5 bio composite swivel lock after tapping in the center tunnel was used for an internal brace swivel lock construct.  We used each of the sutures of the bio composite corkscrew's taking 1 limb and performing a running Krakw suture from distal to proximal and back distal within the quadriceps tendon.  The alternate limb was passed through the tissue so that the knot stack stayed on the superior aspect of the quadriceps to compress tendon to bone.  We were able to easily reduce the tendon to bone.  We placed a internal brace swivel lock 4.75 bio composite with a fiber tape that was placed into the quadriceps and placed in a knotless fashion into the center hole of our patella.  We then sequentially tied our anchors sutures imbricating tissue to bone.  We then performed separate medial and lateral retinacular repairs using #1 Vicryl.  At that point we felt that the construct was relatively stable however a augment with a graft would be indicated we felt in this revision setting.  We selected a Arther flex graft and anchored it proximal and distal with #2 FiberWire.  At that point we placed multiple stay stitches using #1 Vicryl securing it to the tissues of the quadriceps, patella and the patellar tendon.  This crossed the rupture site and we were happy with the final construct.  We irrigated the wound copiously before placing local antibiotic as listed above.  We closed the incision in a multilayer fashion with absorbable suture.  Sterile dressing was placed.  3 sided long-leg splint was placed.  Patient was awoken taken to PACU in stable condition.  Noemi Chapel, PA-C, present and scrubbed throughout the case, critical for completion in a timely fashion, and for retraction, instrumentation, closure.

## 2022-09-26 NOTE — Anesthesia Procedure Notes (Signed)
Anesthesia Regional Block: Femoral nerve block   Pre-Anesthetic Checklist: , timeout performed,  Correct Patient, Correct Site, Correct Laterality,  Correct Procedure, Correct Position, site marked,  Risks and benefits discussed,  Surgical consent,  Pre-op evaluation,  At surgeon's request and post-op pain management  Laterality: Right and Lower  Prep: chloraprep       Needles:  Injection technique: Single-shot  Needle Type: Echogenic Needle     Needle Length: 9cm  Needle Gauge: 21     Additional Needles:   Procedures:,,,, ultrasound used (permanent image in chart),,    Narrative:  Start time: 09/26/2022 2:14 PM End time: 09/26/2022 2:22 PM Injection made incrementally with aspirations every 5 mL.  Performed by: Personally  Anesthesiologist: Annye Asa, MD  Additional Notes: Pt identified in Holding room.  Monitors applied. Working IV access confirmed. Sterile prep R inguinal crease.  #21ga ECHOgenic Arrow block needle to femoral nerve with US guidance.  30cc 0.5% Bupivacaine 1:200k epi injected incrementally after negative test dose.  Patient asymptomatic, VSS, no heme aspirated, tolerated well.   Jenita Seashore, MD

## 2022-09-26 NOTE — Progress Notes (Addendum)
CRITICAL RESULT PROVIDER NOTIFICATION  Test performed and critical result:  CBG 59  Date and time result received:  11/29/202301655  Provider name/title: Dr Glennon Mac  Date and time provider notified: 09/26/2022 1658  Date and time provider responded: 09/26/2022 1658  Provider response:See new orders  1720 cbg 100

## 2022-09-26 NOTE — Transfer of Care (Signed)
Immediate Anesthesia Transfer of Care Note  Patient: Jocelyn Kaufman  Procedure(s) Performed: REVISION REPAIR QUADRICEP TENDON (Right) LIGAMENT REPAIR/knee (Right)  Patient Location: PACU  Anesthesia Type:General  Level of Consciousness: awake and oriented  Airway & Oxygen Therapy: Patient Spontanous Breathing  Post-op Assessment: Report given to RN  Post vital signs: Reviewed and stable  Last Vitals:  Vitals Value Taken Time  BP 160/72 09/26/22 1650  Temp    Pulse 56 09/26/22 1651  Resp 14 09/26/22 1651  SpO2 98 % 09/26/22 1651  Vitals shown include unvalidated device data.  Last Pain:  Vitals:   09/26/22 1417  TempSrc:   PainSc: 0-No pain         Complications: No notable events documented.

## 2022-09-26 NOTE — Anesthesia Procedure Notes (Signed)
Procedure Name: LMA Insertion Date/Time: 09/26/2022 3:04 PM  Performed by: Janene Harvey, CRNAPre-anesthesia Checklist: Patient identified, Emergency Drugs available, Suction available and Patient being monitored Patient Re-evaluated:Patient Re-evaluated prior to induction Oxygen Delivery Method: Circle system utilized Preoxygenation: Pre-oxygenation with 100% oxygen Induction Type: IV induction LMA: LMA inserted LMA Size: 4.0 Placement Confirmation: positive ETCO2 Dental Injury: Teeth and Oropharynx as per pre-operative assessment

## 2022-09-27 ENCOUNTER — Encounter (HOSPITAL_COMMUNITY): Payer: Self-pay | Admitting: Orthopaedic Surgery

## 2022-09-27 ENCOUNTER — Other Ambulatory Visit: Payer: Self-pay

## 2022-09-27 LAB — GLUCOSE, CAPILLARY
Glucose-Capillary: 123 mg/dL — ABNORMAL HIGH (ref 70–99)
Glucose-Capillary: 135 mg/dL — ABNORMAL HIGH (ref 70–99)
Glucose-Capillary: 154 mg/dL — ABNORMAL HIGH (ref 70–99)
Glucose-Capillary: 174 mg/dL — ABNORMAL HIGH (ref 70–99)

## 2022-09-27 LAB — CBC
HCT: 30 % — ABNORMAL LOW (ref 36.0–46.0)
Hemoglobin: 9.1 g/dL — ABNORMAL LOW (ref 12.0–15.0)
MCH: 28.7 pg (ref 26.0–34.0)
MCHC: 30.3 g/dL (ref 30.0–36.0)
MCV: 94.6 fL (ref 80.0–100.0)
Platelets: 165 10*3/uL (ref 150–400)
RBC: 3.17 MIL/uL — ABNORMAL LOW (ref 3.87–5.11)
RDW: 15.4 % (ref 11.5–15.5)
WBC: 8.3 10*3/uL (ref 4.0–10.5)
nRBC: 0 % (ref 0.0–0.2)

## 2022-09-27 MED ORDER — ENOXAPARIN SODIUM 120 MG/0.8ML IJ SOSY
110.0000 mg | PREFILLED_SYRINGE | Freq: Two times a day (BID) | INTRAMUSCULAR | Status: DC
  Administered 2022-09-27 – 2022-09-30 (×7): 110 mg via SUBCUTANEOUS
  Filled 2022-09-27 (×8): qty 0.74

## 2022-09-27 NOTE — Evaluation (Signed)
Occupational Therapy Evaluation Patient Details Name: Jocelyn Kaufman MRN: [id number] DOB: 27-May-1956 Today's Date: 09/27/2022   History of Present Illness Pt is a 66 yr old female who was admitted due to a R quad tendon rupture in July and then had sx on 05/30/22 but had multiple falls. Pt s/p 09/26/22 revision quadriceps repair. PMH: DM, HTN, fibromyalgia, hypothyroidism, RA, CKD, afib, asthma, ORIF of RUE   Clinical Impression   Pt  noted in session with Physical Therapy was very tearful throughout the session due to not being able to do most things at this time but agreed to work with therapies. Pt need extended time to complete all activities at this time and to follow NW status. Pt required max x2 for all transfers in session with lateral stand pivot to chair to L side with RW. Pt then reported they needed to urinate and willing to trial with bed pan while in chair with total assist to place x2.  Pt currently with functional limitations due to the deficits listed below (see OT Problem List).  Pt will benefit from skilled OT to increase their safety and independence with ADL and functional mobility for ADL to facilitate discharge to venue listed below.        Recommendations for follow up therapy are one component of a multi-disciplinary discharge planning process, led by the attending physician.  Recommendations may be updated based on patient status, additional functional criteria and insurance authorization.   Follow Up Recommendations  Skilled nursing-short term rehab (<3 hours/day)     Assistance Recommended at Discharge Frequent or constant Supervision/Assistance  Patient can return home with the following Two people to help with walking and/or transfers;Two people to help with bathing/dressing/bathroom;Assistance with cooking/housework;Assist for transportation    Functional Status Assessment  Patient has had a recent decline in their functional status and demonstrates the ability to  make significant improvements in function in a reasonable and predictable amount of time.  Equipment Recommendations  Other (comment) (TBD)    Recommendations for Other Services       Precautions / Restrictions Precautions Precautions: Knee Required Braces or Orthoses: Splint/Cast Knee Immobilizer - Right: On at all times Restrictions Weight Bearing Restrictions: Yes RLE Weight Bearing: Non weight bearing      Mobility Bed Mobility Overal bed mobility: Needs Assistance Bed Mobility: Supine to Sit     Supine to sit: Max assist     General bed mobility comments: Pt requires Max A for supine to sitting with verbal cues for sequencing and Max A at the LE for abduction to get off the EOB due to weakness in the RLE secondary to recent surgery/injury.    Transfers Overall transfer level: Needs assistance Equipment used: Rolling walker (2 wheels) Transfers: Sit to/from Stand, Bed to chair/wheelchair/BSC Sit to Stand: +2 physical assistance     Step pivot transfers: +2 physical assistance     General transfer comment: RW verbal cues throughout for WB status due to pt has difficulty maintaining NWB status on the RLE and for sequencing in order to decrease need for physical assist with sit to stand.      Balance Overall balance assessment: History of Falls                                         ADL either performed or assessed with clinical judgement   ADL Overall ADL's :  Needs assistance/impaired Eating/Feeding: Set up;Sitting   Grooming: Wash/dry hands;Wash/dry face;Set up;Sitting   Upper Body Bathing: Min guard;Sitting   Lower Body Bathing: Maximal assistance;Total assistance;+2 for physical assistance;+2 for safety/equipment;Sit to/from stand   Upper Body Dressing : Min guard;Sitting   Lower Body Dressing: Maximal assistance;Total assistance;+2 for physical assistance;+2 for safety/equipment;Sit to/from stand   Toilet Transfer: Maximal  assistance;+2 for physical assistance;+2 for safety/equipment;BSC/3in1;Rolling walker (2 wheels)   Toileting- Clothing Manipulation and Hygiene: Total assistance;+2 for physical assistance;+2 for safety/equipment;Sit to/from stand       Functional mobility during ADLs: Moderate assistance;+2 for physical assistance;+2 for safety/equipment;Rolling walker (2 wheels);Cueing for sequencing;Cueing for safety       Vision         Perception     Praxis      Pertinent Vitals/Pain Pain Assessment Pain Assessment: 0-10 Pain Score: 3  Pain Descriptors / Indicators: Aching Pain Intervention(s): Limited activity within patient's tolerance, Monitored during session, Repositioned, Ice applied     Hand Dominance Right   Extremity/Trunk Assessment Upper Extremity Assessment Upper Extremity Assessment: RUE deficits/detail RUE Deficits / Details: due to RA and past sx weaker the LUE and depedning on RA flare up of strength RUE Sensation: WNL RUE Coordination: WNL   Lower Extremity Assessment Lower Extremity Assessment: Defer to PT evaluation RLE Deficits / Details: Pt has NWB orders pt can move toes pt requires Max A to abduct RLE off of the bed   Cervical / Trunk Assessment Cervical / Trunk Assessment: Kyphotic   Communication Communication Communication: No difficulties   Cognition Arousal/Alertness: Awake/alert Behavior During Therapy: WFL for tasks assessed/performed Overall Cognitive Status: Within Functional Limits for tasks assessed                                       General Comments  Pt requires heavy UE support and assist while standing due to WB status RLE and instability on the LLE    Exercises     Shoulder Instructions      Home Living Family/patient expects to be discharged to:: Private residence Living Arrangements: Alone Available Help at Discharge: Family;Available PRN/intermittently Type of Home: House Home Access: Stairs to  enter Entrance Stairs-Number of Steps: 2 Entrance Stairs-Rails: Right Home Layout: One level         Bathroom Toilet: Standard     Home Equipment: Conservation officer, nature (2 wheels);Cane - quad;Toilet riser   Additional Comments: pt has a make shift shower chair in the shower.      Prior Functioning/Environment Prior Level of Function : Independent/Modified Independent             Mobility Comments: pt stated she has been going to OPPT and recently got off the RW and onto a single cane, however , over past week had to start using 2 canes to help stabilize herself ADLs Comments: has cat she feeds outdoors, driving to get groceries        OT Problem List: Decreased strength;Decreased range of motion;Decreased activity tolerance;Impaired balance (sitting and/or standing);Decreased safety awareness;Decreased knowledge of use of DME or AE;Cardiopulmonary status limiting activity;Pain      OT Treatment/Interventions: Self-care/ADL training;DME and/or AE instruction;Therapeutic activities;Patient/family education;Balance training    OT Goals(Current goals can be found in the care plan section) Acute Rehab OT Goals Patient Stated Goal: to have more control OT Goal Formulation: With patient Time For Goal Achievement: 10/11/22 Potential to Achieve  Goals: Good  OT Frequency: Min 2X/week    Co-evaluation   Reason for Co-Treatment: For patient/therapist safety;To address functional/ADL transfers PT goals addressed during session: Mobility/safety with mobility;Proper use of DME;Balance        AM-PAC OT "6 Clicks" Daily Activity     Outcome Measure Help from another person eating meals?: None Help from another person taking care of personal grooming?: A Little Help from another person toileting, which includes using toliet, bedpan, or urinal?: Total Help from another person bathing (including washing, rinsing, drying)?: A Lot Help from another person to put on and taking off regular  upper body clothing?: A Little Help from another person to put on and taking off regular lower body clothing?: A Lot 6 Click Score: 15   End of Session Equipment Utilized During Treatment: Gait belt;Rolling walker (2 wheels) Nurse Communication: Mobility status  Activity Tolerance: Patient limited by fatigue;Patient limited by pain Patient left: in chair;with call bell/phone within reach;with chair alarm set  OT Visit Diagnosis: Unsteadiness on feet (R26.81);Other abnormalities of gait and mobility (R26.89);Repeated falls (R29.6);Muscle weakness (generalized) (M62.81);History of falling (Z91.81);Pain Pain - Right/Left: Right Pain - part of body: Leg                Time: 3559-7416 OT Time Calculation (min): 70 min Charges:  OT General Charges $OT Visit: 1 Visit OT Evaluation $OT Eval Moderate Complexity: 1 Mod OT Treatments $Self Care/Home Management : 23-37 mins  Joeseph Amor OTR/L  Acute Rehab Services  [phone] office number [phone] pager number   Joeseph Amor 09/27/2022, 12:38 PM

## 2022-09-27 NOTE — Evaluation (Signed)
Physical Therapy Evaluation Patient Details Name: Jocelyn Kaufman MRN: [id number] DOB: [birth year] Today's Date: 09/27/2022  History of Present Illness  Pt is a 66 yr old female who was admitted due to a R quad tendon rupture in July and then had sx on 05/30/22 but had multiple falls. Pt s/p 09/26/22 revision quadriceps repair. PMH: DM, HTN, fibromyalgia, hypothyroidism, RA, CKD, afib, asthma, ORIF of RUE  Clinical Impression  Pt was very tearful during session but cooperative and wanted to participate. She did report increased pain with mobility that improved with rest. Pt requires heavy assistance with 2 person assist this date secondary to NWB status. Pt will benefit from continued skilled physical therapy services in order to return to PLOF following discharge from acute care hospital setting.        Recommendations for follow up therapy are one component of a multi-disciplinary discharge planning process, led by the attending physician.  Recommendations may be updated based on patient status, additional functional criteria and insurance authorization.  Follow Up Recommendations Skilled nursing-short term rehab (<3 hours/day) Can patient physically be transported by private vehicle: No    Assistance Recommended at Discharge Frequent or constant Supervision/Assistance  Patient can return home with the following  Two people to help with walking and/or transfers    Equipment Recommendations Other (comment) (Defer to SNF)  Recommendations for Other Services       Functional Status Assessment Patient has had a recent decline in their functional status and demonstrates the ability to make significant improvements in function in a reasonable and predictable amount of time.     Precautions / Restrictions Precautions Precautions: Knee Required Braces or Orthoses: Knee Immobilizer - Right Knee Immobilizer - Right: On at all times Restrictions Weight Bearing Restrictions: Yes RLE Weight  Bearing: Non weight bearing      Mobility  Bed Mobility Overal bed mobility: Needs Assistance Bed Mobility: Supine to Sit     Supine to sit: Max assist     General bed mobility comments: Pt requires Max A for supine to sitting with verbal cues for sequencing and Max A at the LE for abduction to get off the EOB due to weakness in the RLE secondary to recent surgery/injury.    Transfers Overall transfer level: Needs assistance Equipment used: Rolling walker (2 wheels) Transfers: Sit to/from Stand, Bed to chair/wheelchair/BSC Sit to Stand: +2 physical assistance   Step pivot transfers: +2 physical assistance       General transfer comment: RW verbal cues throughout for WB status due to pt has difficulty maintaining NWB status on the RLE and for sequencing in order to decrease need for physical assist with sit to stand.    Ambulation/Gait   Unable to attempt today      Stairs     Unable to attempt today            Balance Overall balance assessment: History of Falls            Pertinent Vitals/Pain Pain Assessment Pain Assessment: 0-10 Pain Score: 3  Pain Descriptors / Indicators: Aching Pain Intervention(s): Limited activity within patient's tolerance, Premedicated before session    Home Living Family/patient expects to be discharged to:: Private residence Living Arrangements: Alone Available Help at Discharge: Family;Available PRN/intermittently Type of Home: House Home Access: Stairs to enter Entrance Stairs-Rails: Right Entrance Stairs-Number of Steps: 2   Home Layout: One level Home Equipment: Conservation officer, nature (2 wheels);Cane - quad;Toilet riser Additional Comments: pt has a make shift  shower chair in the shower.    Prior Function Prior Level of Function : Independent/Modified Independent             Mobility Comments: pt stated she has been going to [facility]PP[facility] and recently got off the RW and onto a single cane, however , over past week had to  start using 2 canes to help stabilize herself ADLs Comments: has cat she feeds outdoors, driving to get groceries     Hand Dominance   Dominant Hand: Right    Extremity/Trunk Assessment   Upper Extremity Assessment Upper Extremity Assessment: Defer to OT evaluation    Lower Extremity Assessment Lower Extremity Assessment: RLE deficits/detail RLE Deficits / Details: Pt has NWB orders pt can move toes pt requires Max A to abduct RLE off of the bed       Communication   Communication: No difficulties  Cognition Arousal/Alertness: Awake/alert Behavior During Therapy: WFL for tasks assessed/performed Overall Cognitive Status: Within Functional Limits for tasks assessed          General Comments General comments (skin integrity, edema, etc.): Pt requires heavy UE support and assist while standing due to WB status RLE and instability on the LLE    Exercises     Assessment/Plan    PT Assessment Patient needs continued PT services  PT Problem List Decreased strength;Decreased balance;Pain;Decreased mobility       PT Treatment Interventions Patient/family education;Balance training;Functional mobility training;Gait training;Therapeutic activities;Neuromuscular re-education;Manual techniques;Therapeutic exercise;Stair training    PT Goals (Current goals can be found in the Care Plan section)  Acute Rehab PT Goals Patient Stated Goal: GOAL 1: Pt will perform sit <> stand Max A with 1 person assist and correct hand placement as well as maintaining NWB status on the RLE. GOAL 2: Pt will perform bed mobility at Mod A in order to decrease need for assist from caregivers. GOAL 3: Pt will perform transfers at Max A one person assist while maintaining WB status and appropriate use of LRAD in order to promote independence. PT Goal Formulation: With patient Time For Goal Achievement: 10/11/22 Potential to Achieve Goals: Fair    Frequency Min 3X/week     Co-evaluation PT/OT/SLP  Co-Evaluation/Treatment: Yes Reason for Co-Treatment: For patient/therapist safety;To address functional/ADL transfers PT goals addressed during session: Mobility/safety with mobility;Proper use of DME;Balance         AM-PAC PT "6 Clicks" Mobility  Outcome Measure Help needed turning from your back to your side while in a flat bed without using bedrails?: A Lot Help needed moving from lying on your back to sitting on the side of a flat bed without using bedrails?: A Lot Help needed moving to and from a bed to a chair (including a wheelchair)?: Total Help needed standing up from a chair using your arms (e.g., wheelchair or bedside chair)?: Total Help needed to walk in hospital room?: Total Help needed climbing 3-5 steps with a railing? : Total 6 Click Score: 8    End of Session Equipment Utilized During Treatment: Gait belt;Right knee immobilizer Activity Tolerance: Other (comment) (pt was very tearful throughout session. She feels that she is not in control of what is going on. extra time was spent with pt and needs met at end of session.) Patient left: in chair;with chair alarm set Nurse Communication: Mobility status;Precautions PT Visit Diagnosis: Muscle weakness (generalized) (M62.81);History of falling (Z91.81);Other abnormalities of gait and mobility (R26.89)    Time: 6269-4854 PT Time Calculation (min) (ACUTE ONLY): 70 min  Charges:   PT Evaluation $PT Eval Low Complexity: 1 Low PT Treatments $Therapeutic Activity: 23-37 mins      Tomma Rakers, DPT, CLT  Acute Rehabilitation Services Office: [phone] (Secure chat preferred)   Ander Purpura 09/27/2022, 12:16 PM

## 2022-09-27 NOTE — Progress Notes (Signed)
ANTICOAGULATION CONSULT NOTE - follow up  Pharmacy Consult for Lovenox Indication: pulmonary embolus  Allergies  Allergen Reactions   Semaglutide(0.25 Or 0.46m-Dos) Other (See Comments)    Causes cataracts requiring surgery and impairing vision    Shellfish-Derived Products Anaphylaxis, Swelling and Other (See Comments)    Patient avoids   Empagliflozin Itching, Swelling and Other (See Comments)    UTI sx's and yeast infections   Rho (D) Immune Globulin      Not available   Tramadol Hcl Other (See Comments)    hallucinations   Canagliflozin Other (See Comments)    Urinary incontinence    Latex Rash   Liraglutide Itching    Other reaction(s): itching all over   Minoxidil Rash    skin irritation   Omeprazole Rash   Percocet [Oxycodone-Acetaminophen] Nausea And Vomiting    Patient Measurements: Height: 5' 4.5" (163.8 cm) Weight: 108.9 kg (240 lb) IBW/kg (Calculated) : 55.85 Heparin Dosing Weight:   Vital Signs: Temp: 98 F (36.7 C) (11/30 0723) Temp Source: Oral (11/30 0723) BP: 105/58 (11/30 0723) Pulse Rate: 51 (11/30 0723)  Labs: Recent Labs    09/26/22 1914 09/27/22 0620  HGB 10.1* 9.1*  HCT 33.6* 30.0*  PLT 179 165  CREATININE 1.06*  --     Estimated Creatinine Clearance: 63.5 mL/min (A) (by C-G formula based on SCr of 1.06 mg/dL (H)).   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   09/25/2022   Aflibercept (EYLEA) 2 MG/0.05ML SOLN 2 mg by Intravitreal route every 3 (three) months.   Past Month   albuterol (VENTOLIN HFA) 108 (90 Base) MCG/ACT inhaler Inhale two puffs every four to six hours as needed for cough or wheeze. (Patient taking differently: Inhale 1-2 puffs into the lungs every 6 (six) hours as needed for wheezing.) 18 g 2 Past Month   amLODipine (NORVASC) 10 MG tablet Take 1 tablet (10 mg total) by mouth daily.   09/25/2022   APIXABAN (ELIQUIS) VTE STARTER PACK (10MG AND 5MG) Take as directed on package: start with two-5178mtablets twice daily for 7 days. On day 8, switch to one-78m49mablet twice daily. 74 tablet 0 09/23/2022   atorvastatin (LIPITOR) 40 MG tablet Take 1 tablet (40 mg total) by mouth daily at 6 PM. (Patient taking differently: Take 40 mg by mouth daily.) 30 tablet 0    CHELATED IRON PO Take 18 mg by mouth daily.   Past Week   Cholecalciferol (VITAMIN D3) 125 MCG (5000 UT) CAPS Take 5,000 Units by mouth daily.   Past Week   ciclopirox (PENLAC) 8 % solution Apply topically  at bedtime. Apply over nail and surrounding skin. Apply daily over previous coat. After seven (7) days, may remove with alcohol and continue cycle. 6.6 mL 0    Dulaglutide (TRULICITY) 1.5 GX/2.1JH SOPN Inject 1.5 mg into the skin every Tuesday.   09/18/2022   enoxaparin (LOVENOX) 150 MG/ML injection Inject 1 mL (150 mg total) into the skin every 12 (twelve) hours. Self administer twice a day for 2 days starting 09/24/22. 4 mL 0 09/25/2022 at 1900   EPINEPHrine (EPIPEN 2-PAK) 0.3 mg/0.3 mL IJ SOAJ injection Use as directed for life-threatening allergic reaction. 2 each 1    epoetin alfa-epbx (RETACRIT) 2000 UNIT/ML injection See admin instructions.      escitalopram (LEXAPRO) 20 MG tablet Take 20 mg by mouth daily.   09/26/2022  at 0800   ezetimibe (ZETIA) 10 MG tablet Take 1 tablet (10 mg total) by mouth daily.   09/26/2022 at 0800   fexofenadine (ALLEGRA ALLERGY) 180 MG tablet Take 1 tablet (180 mg total) by mouth daily.   41/74/0814 at 4818   folic acid (FOLVITE) 1 MG tablet Take 2 mg by mouth daily.   Past Week   hydrALAZINE (APRESOLINE) 50 MG tablet Take 50 mg by mouth 3 (three) times daily.   09/26/2022 at 0800   HYDROcodone-acetaminophen (NORCO) 5-325 MG tablet Take 1 tablet by mouth every 6 (six) hours as needed for severe pain. 15 tablet 0 Past Week   hydrocortisone cream 1 % Apply 1 Application topically 2 (two) times daily as needed for itching.   Past Week   insulin aspart (NOVOLOG) 100 UNIT/ML FlexPen Inject 20 Units into the skin See admin instructions. Inject 20 units into the skin two to three times a day before meals   09/25/2022   insulin glargine, 2 Unit Dial, (TOUJEO MAX SOLOSTAR) 300 UNIT/ML Solostar Pen Inject 46 Units into the skin daily. (Patient taking differently: Inject 42 Units into the skin at bedtime.)   09/25/2022   ipratropium (ATROVENT) 0.06 % nasal spray Place 1-2 sprays each nostril as needed up to 3 times a day (Patient taking differently: Place 1-2 sprays into both nostrils 3 (three) times daily as needed for rhinitis.) 15 mL 5 not taking   irbesartan (AVAPRO) 300 MG tablet Take 1 tablet (300 mg total) by mouth daily.   09/25/2022   latanoprost (XALATAN) 0.005 % ophthalmic solution Place 1 drop into both eyes at bedtime.   Past Week   lidocaine (LMX) 4 % cream Apply 1 Application topically as needed (pain).   Past Week   Methotrexate Sodium (METHOTREXATE, PF,) 50 MG/2ML injection Inject 20 mg into the muscle every Wednesday. 0.8 ml   09/18/2022   metoprolol succinate (TOPROL-XL) 25 MG 24 hr tablet Take 1 tablet (25 mg total) by mouth daily. May take additional half tablet as needed once daily for palpitations. (Patient taking differently: Take 12.5-25 mg by mouth See admin instructions.  Take 25 mg by mouth once a day and an additional 12.5 mg (half tablet) as needed once daily for palpitations) 135 tablet 3 09/26/2022 at 0800   Multiple Vitamins-Minerals (CENTRUM SILVER PO) Take 1 tablet by mouth daily with breakfast.   Past Week   predniSONE (DELTASONE) 10 MG tablet Take 10 mg by mouth daily.   09/25/2022   sulfaSALAzine (AZULFIDINE) 500 MG tablet Take 3 tablets (1,500 mg total) by mouth 2 (two) times daily.   Past Week   SYNTHROID 125 MCG tablet Take 125 mcg by mouth every Monday, Tuesday, Wednesday,  Thursday, and Friday.   09/26/2022 at 0800   triamcinolone cream (KENALOG) 0.1 % Apply 1 Application topically 3 (three) times daily as needed (for itching- affected areas).      vitamin B-12 (CYANOCOBALAMIN) 1000 MCG tablet Take 1,000 mcg by mouth daily.   Past Week   Accu-Chek Softclix Lancets lancets use to check blood sugar      B-D ULTRAFINE III SHORT PEN 31G X 8 MM MISC SMARTSIG:1 Each SUB-Q Daily      Lancets Misc. (ACCU-CHEK SOFTCLIX LANCET DEV) KIT See admin instructions.      metaxalone (SKELAXIN) 800 MG tablet Take 800 mg by mouth 3 (three) times daily as needed for muscle spasms.   More than a month   Scheduled:   acetaminophen  1,000 mg Oral Q8H   amLODipine  10 mg Oral Daily   atorvastatin  40 mg Oral Daily   cholecalciferol  5,000 Units Oral Daily   cyanocobalamin  1,000 mcg Oral Daily   docusate sodium  100 mg Oral BID   enoxaparin (LOVENOX) injection  110 mg Subcutaneous Q12H   escitalopram  20 mg Oral Daily   ezetimibe  10 mg Oral Daily   folic acid  2 mg Oral Daily   hydrALAZINE  50 mg Oral TID   insulin aspart  0-15 Units Subcutaneous TID WC   insulin aspart  4 Units Subcutaneous TID WC   insulin glargine-yfgn  42 Units Subcutaneous QHS   irbesartan  300 mg Oral Daily   ketorolac  7.5 mg Intravenous Q6H   latanoprost  1 drop Both Eyes QHS   levothyroxine  125 mcg Oral Once per day on Mon Tue Wed Thu Fri   loratadine  10 mg Oral Daily   metoprolol  succinate  25 mg Oral Daily   predniSONE  10 mg Oral Daily   sulfaSALAzine  1,500 mg Oral BID   Infusions:    ceFAZolin (ANCEF) IV 2 g (09/27/22 0313)   lactated ringers 1,000 mL with potassium chloride 20 mEq infusion 10 mL/hr at 09/26/22 2124   methocarbamol (ROBAXIN) IV      Assessment: Pt with recent PE in 10/23. She has been on Eliquis prior to conversion to Lovenox for surgery bridging. She is s/p Revision quadriceps repair with allograft 11/29. Ok to start Lovenox  on 11/30 AM per Dr. Griffin Basil and continue while here.   Scr 1.06 Hgb 10.5>9.1, plt 165 within normal.  No bleeding reported.   Goal of Therapy:  Anti-Xa level 0.6-1 units/ml 4hrs after LMWH dose given Monitor platelets by anticoagulation protocol: Yes   Plan:  Lovenox 110 mg SQ BID starting 11/30 AM. CBC q3d  Nicole Cella, RPh Clinical Pharmacist 09/27/2022 10:37 AM

## 2022-09-27 NOTE — Progress Notes (Signed)
   ORTHOPAEDIC PROGRESS NOTE  s/p Procedure(s): REVISION REPAIR QUADRICEP TENDON LIGAMENT REPAIR/knee on 09/26/2022 with Dr. Griffin Basil  SUBJECTIVE: Reports moderate pain about operative site. Having more pain at her heel due to the splint. No chest pain. No SOB. No nausea/vomiting. No other complaints.  OBJECTIVE: PE: General: resting in hospital bed, NAD RLE: splint CDI, +EHL though remainder of motor difficult to test due to splint, sensation intact distally with warm well perfused foot, no pain w passive stretch, warm well perfused extremity   Vitals:   09/27/22 0431 09/27/22 0723  BP: 106/69 (!) 105/58  Pulse: (!) 51 (!) 51  Resp: 17   Temp: 98.3 F (36.8 C) 98 F (36.7 C)  SpO2: 100% 100%   Stable post-op images.   ASSESSMENT: Jocelyn Kaufman is a 66 y.o. female doing well postoperatively. POD#1  PLAN: Weightbearing: NWB in splint Insicional and dressing care: Dressings left intact until follow-up Did adjust her splint this morning to add more padding around her heel. She noted this was more comfortable afterwards Orthopedic device(s): None and Splint Showering: Hold for now VTE prophylaxis: Lovenox Pain control: PRN pain medications, minimize narcotics as able Follow - up plan: 2 weeks in office Dispo: SNF. PT/OT evals pending. TOC following. Will discharge to SNF upon bed availability.  Contact information:  Weekdays 8-5 Dr. Ophelia Charter, Noemi Chapel PA-C , After hours and holidays please check [email or web address] for group call information for [facility]  Noemi Chapel, PA-C 09/27/2022

## 2022-09-27 NOTE — TOC Initial Note (Signed)
Transition of Care [facility]) - Initial/Assessment Note    Patient Details  Name: Jocelyn Kaufman MRN: [id number] Date of Birth: 06/08/1956  [facility]) CM/SW Contact:    Joanne Chars, LCSW Phone Number: 09/27/2022, 3:05 PM  Clinical Narrative:    CSW met with pt regarding DC recommendations for SNF.  Pt is agreeable to this, choice document given.  Pt was at [facility] in the past and does not want to return there.  Pt currently lives home alone with no services.  Permission given to speak with sister Silva Bandy, permission given to send out referral in hub.  Pt is vaccinated for covid with one booster.  Referral sent out in hub. Passr went to level 2.               Expected Discharge Plan: Skilled Nursing Facility Barriers to Discharge: Continued Medical Work up, SNF Pending bed offer   Patient Goals and CMS Choice Patient states their goals for this hospitalization and ongoing recovery are:: independence CMS Medicare.gov Compare Post Acute Care list provided to:: Patient Choice offered to / list presented to : Patient  Expected Discharge Plan and Services Expected Discharge Plan: [place] In-house Referral: Clinical Social Work   Post Acute Care Choice: [place] Living arrangements for the past 2 months: [place]                                      Prior Living Arrangements/Services Living arrangements for the past 2 months: Single Family Home Lives with:: Self Patient language and need for interpreter reviewed:: Yes Do you feel safe going back to the place where you live?: Yes      Need for Family Participation in Patient Care: No (Comment) Care giver support system in place?: Yes (comment) Current home services: Other (comment) (none) Criminal Activity/Legal Involvement Pertinent to Current Situation/Hospitalization: No - Comment as needed  Activities of Daily Living Home Assistive Devices/Equipment:  Walker (specify type), CBG Meter, Blood pressure cuff, Cane (specify quad or straight), Shower chair with back ADL Screening (condition at time of admission) Patient's cognitive ability adequate to safely complete daily activities?: Yes Is the patient deaf or have difficulty hearing?: No Does the patient have difficulty seeing, even when wearing glasses/contacts?: No Does the patient have difficulty concentrating, remembering, or making decisions?: No Patient able to express need for assistance with ADLs?: Yes Does the patient have difficulty dressing or bathing?: Yes Independently performs ADLs?: Yes (appropriate for developmental age) Does the patient have difficulty walking or climbing stairs?: Yes Weakness of Legs: Left Weakness of Arms/Hands: Both  Permission Sought/Granted Permission sought to share information with : Family Supports Permission granted to share information with : Yes, Verbal Permission Granted  Share Information with NAME: sister Silva Bandy  Permission granted to share info w AGENCY: SNF        Emotional Assessment Appearance:: Appears stated age Attitude/Demeanor/Rapport: Engaged Affect (typically observed): Appropriate, Irritable Orientation: : Oriented to Self, Oriented to Place, Oriented to  Time, Oriented to Situation      Admission diagnosis:  Quadriceps muscle rupture, right, sequela [S76.111S] Patient Active Problem List   Diagnosis Date Noted   Quadriceps muscle rupture, right, sequela 09/26/2022   Pulmonary embolus ([place]) 08/20/2022   Acquired trigger finger 05/26/2022   Abnormal reflex 02/20/2022   Anxiety 02/20/2022   Body mass index (BMI) 40.0-44.9, adult ([place])  02/20/2022   Breast tenderness 02/20/2022   Carotid artery occlusion 02/20/2022   Cataract 02/20/2022   Chronic kidney disease, stage 3a ([place]) 02/20/2022   Diabetic renal disease ([place]) 02/20/2022   Diabetic retinopathy associated with type 2 diabetes mellitus ([place]) 02/20/2022    Encounter for general adult medical examination without abnormal findings 02/20/2022   Family history of malignant neoplasm of digestive organs 02/20/2022   Fibromyalgia 02/20/2022   Generalized anxiety disorder 02/20/2022   History of musculoskeletal disease 02/20/2022   Hypothyroid 02/20/2022   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[place]) 02/20/2022   Morbid obesity ([place]) 02/20/2022   Mild intermittent asthma 02/20/2022   Myalgia 02/20/2022   Neuropathy of both feet 02/20/2022   Non-toxic nodular goiter 02/20/2022   Osteoarthritis of knee 02/20/2022   Obstructive sleep apnea syndrome 02/20/2022   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[place]) 02/20/2022   Paroxysmal ventricular tachycardia ([place]) 02/20/2022   Postmenopausal state 02/20/2022   Personal history of transient ischemic attack (TIA), 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 deficits 02/20/2022   Recurrent major depression in full remission ([place]) 02/20/2022   Renal failure syndrome 02/20/2022   Sciatica 02/20/2022   Vitamin D deficiency 02/20/2022   Bilateral hearing loss 08/29/2020   Dysequilibrium 08/29/2020   Anemia in chronic kidney disease 06/17/2020   Anemia of chronic disease 04/25/2020   Impingement syndrome of right shoulder region 11/25/2018   Osteoarthritis of right shoulder 11/03/2018   Pain in joint of right shoulder 11/03/2018   Fall at home, initial encounter 07/06/2018   Displaced spiral fracture of shaft of humerus, right arm, initial encounter for closed fracture 07/06/2018   TIA (transient ischemic attack) 07/06/2018   Allergic rhinitis 07/04/2018   Normochromic normocytic anemia 01/20/2018   Impingement syndrome of left shoulder 12/17/2017   Rheumatoid arthritis ([place]) 04/30/2017   Lesion of left humerus 04/29/2017   Adrenal adenoma 03/19/2017   Neuropathy 03/19/2017   Spinal stenosis 03/19/2017   Pericardial effusion 03/14/2017   Diabetes mellitus ([place]) 03/14/2017   Essential hypertension 03/14/2017    Mixed hyperlipidemia 03/14/2017   Ulnar neuropathy at wrist, 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   Meralgia paresthetica of right side 01/10/2017   History of Cushing disease 03/01/2016   Allergic rhinoconjunctivitis 07/09/2015   Asthma, well controlled 07/09/2015   Laryngopharyngeal reflux (LPR) 07/09/2015   PCP:  Deland Pretty, MD Pharmacy:   [facility] [place], [address] [place] [address] [place] [address] Phone: [phone] Fax: [place] [address] Phone: [phone] Fax: [phone]     Social Determinants of Health (SDOH) Interventions    Readmission Risk Interventions     No data to display

## 2022-09-27 NOTE — Progress Notes (Signed)
Mobility Specialist Progress Note   09/27/22 1700  Mobility  Activity Transferred to/from [facility]  Level of Assistance +2 (takes two people) ([place])  Information systems manager Ambulated (ft) 2 ft  RLE Weight Bearing NWB  Activity Response Tolerated well  $Mobility charge 1 Mobility   RN requesting assistance to get pt from chair to [facility] d/t void urgency. +2A modA to stand and pivot d/t constant hip flexion and limited strength in LLE. Pt also anxious throughout needing cues for sequencing and reaffirmation. To the [facility] w/o fault, left w/ call bell in reach.  [place] Falling Mobility Specialist Please contact via SecureChat or  Rehab office at [phone]

## 2022-09-27 NOTE — Progress Notes (Signed)
RE:  Jocelyn Kaufman       Date of Birth: 11-26-2055      Date:   09/27/22       To Whom It May Concern:  Please be advised that the above-named patient will require a short-term nursing home stay - anticipated 30 days or less for rehabilitation and strengthening.  The plan is for return home.                 MD signature                Date

## 2022-09-27 NOTE — NC FL2 (Signed)
Index LEVEL OF CARE FORM     IDENTIFICATION  Patient Name: Jocelyn Kaufman Birthdate: 04-Oct-1956 Sex: female Admission Date (Current Location): 09/26/2022  [facility] and [place] Number:  Herbalist and Address:  [place], [place] [address], [place], [place] [address]      Provider Number: [id number]  Attending Physician Name and Address:  Hiram Gash, MD  Relative Name and Phone Number:  Landis Martins Sister [phone]  [phone]    Current Level of Care: Hospital Recommended Level of Care: [place] Prior Approval Number:    Date Approved/Denied:   PASRR Number:    Discharge Plan: SNF    Current Diagnoses: Patient Active Problem List   Diagnosis Date Noted   Quadriceps muscle rupture, right, sequela 09/26/2022   Pulmonary embolus ([place]) 08/20/2022   Acquired trigger finger 05/26/2022   Abnormal reflex 02/20/2022   Anxiety 02/20/2022   Body mass index (BMI) 40.0-44.9, adult ([place]) 02/20/2022   Breast tenderness 02/20/2022   Carotid artery occlusion 02/20/2022   Cataract 02/20/2022   Chronic kidney disease, stage 3a ([place]) 02/20/2022   Diabetic renal disease ([place]) 02/20/2022   Diabetic retinopathy associated with type 2 diabetes mellitus ([place]) 02/20/2022   Encounter for general adult medical examination without abnormal findings 02/20/2022   Family history of malignant neoplasm of digestive organs 02/20/2022   Fibromyalgia 02/20/2022   Generalized anxiety disorder 02/20/2022   History of musculoskeletal disease 02/20/2022   Hypothyroid 02/20/2022   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[place]) 02/20/2022   Morbid obesity ([place]) 02/20/2022   Mild intermittent asthma 02/20/2022   Myalgia 02/20/2022   Neuropathy of both feet 02/20/2022   Non-toxic nodular goiter 02/20/2022   Osteoarthritis of knee 02/20/2022   Obstructive sleep apnea syndrome 02/20/2022   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[place])  02/20/2022   Paroxysmal ventricular tachycardia ([place]) 02/20/2022   Postmenopausal state 02/20/2022   Personal history of transient ischemic attack (TIA), 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 deficits 02/20/2022   Recurrent major depression in full remission ([place]) 02/20/2022   Renal failure syndrome 02/20/2022   Sciatica 02/20/2022   Vitamin D deficiency 02/20/2022   Bilateral hearing loss 08/29/2020   Dysequilibrium 08/29/2020   Anemia in chronic kidney disease 06/17/2020   Anemia of chronic disease 04/25/2020   Impingement syndrome of right shoulder region 11/25/2018   Osteoarthritis of right shoulder 11/03/2018   Pain in joint of right shoulder 11/03/2018   Fall at home, initial encounter 07/06/2018   Displaced spiral fracture of shaft of humerus, right arm, initial encounter for closed fracture 07/06/2018   TIA (transient ischemic attack) 07/06/2018   Allergic rhinitis 07/04/2018   Normochromic normocytic anemia 01/20/2018   Impingement syndrome of left shoulder 12/17/2017   Rheumatoid arthritis ([place]) 04/30/2017   Lesion of left humerus 04/29/2017   Adrenal adenoma 03/19/2017   Neuropathy 03/19/2017   Spinal stenosis 03/19/2017   Pericardial effusion 03/14/2017   Diabetes mellitus ([place]) 03/14/2017   Essential hypertension 03/14/2017   Mixed hyperlipidemia 03/14/2017   Ulnar neuropathy at wrist, 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   Meralgia paresthetica of right side 01/10/2017   History of Cushing disease 03/01/2016   Allergic rhinoconjunctivitis 07/09/2015   Asthma, well controlled 07/09/2015   Laryngopharyngeal reflux (LPR) 07/09/2015    Orientation RESPIRATION BLADDER Height & Weight     Self, Time, Situation, Place  O2 Incontinent Weight: 240 lb (108.9 kg) Height:  5' 4.5" (163.8 cm)  BEHAVIORAL SYMPTOMS/MOOD NEUROLOGICAL BOWEL NUTRITION STATUS  Continent Diet (see discharge summary)  AMBULATORY STATUS COMMUNICATION OF NEEDS Skin   Total Care Verbally Surgical wounds                        Personal Care Assistance Level of Assistance  Bathing, Feeding, Dressing Bathing Assistance: Maximum assistance Feeding assistance: Limited assistance Dressing Assistance: Maximum assistance     Functional Limitations Info  Sight, Hearing, Speech Sight Info: Adequate Hearing Info: Adequate Speech Info: Adequate    SPECIAL CARE FACTORS FREQUENCY  PT (By licensed PT), OT (By licensed OT)     PT Frequency: 5x week OT Frequency: 5x week            Contractures Contractures Info: Not present    Additional Factors Info  Code Status, Allergies, Insulin Sliding Scale Code Status Info: full Allergies Info: Semaglutide(0.25 Or 0.'5mg'$ -dos), Shellfish-derived Products, Empagliflozin, Rho (D) Immune Globulin, Tramadol Hcl, Canagliflozin, Latex, Liraglutide, Minoxidil, Omeprazole, Percocet (Oxycodone-acetaminophen)   Insulin Sliding Scale Info: Novolog, see discharge summary       Current Medications (09/27/2022):  This is the current hospital active medication list Current Facility-Administered Medications  Medication Dose Route Frequency Provider Last Rate Last Admin   acetaminophen (TYLENOL) tablet 1,000 mg  1,000 mg Oral Q8H McBane, Caroline N, PA-C   1,000 mg at 09/27/22 1459   acetaminophen (TYLENOL) tablet 325-650 mg  325-650 mg Oral Q6H PRN McBane, Caroline N, PA-C       albuterol (PROVENTIL) (2.5 MG/3ML) 0.083% nebulizer solution 2.5 mg  2.5 mg Inhalation Q6H PRN McBane, Caroline N, PA-C       amLODipine (NORVASC) tablet 10 mg  10 mg Oral Daily Ethelda Chick, PA-C   10 mg at 09/27/22 1058   atorvastatin (LIPITOR) tablet 40 mg  40 mg Oral Daily Ethelda Chick, PA-C   40 mg at 09/27/22 1100   bisacodyl (DULCOLAX) EC tablet 5 mg  5 mg Oral Daily PRN McBane, Maylene Roes, PA-C       cholecalciferol (VITAMIN D3) 25 MCG (1000 UNIT) tablet 5,000 Units  5,000 Units Oral Daily Ethelda Chick, PA-C   5,000 Units at 09/27/22 1100   cyanocobalamin (VITAMIN  B12) tablet 1,000 mcg  1,000 mcg Oral Daily Ethelda Chick, PA-C   1,000 mcg at 09/27/22 1059   diphenhydrAMINE (BENADRYL) 12.5 MG/5ML elixir 12.5-25 mg  12.5-25 mg Oral Q4H PRN McBane, Maylene Roes, PA-C       docusate sodium (COLACE) capsule 100 mg  100 mg Oral BID McBane, Caroline N, PA-C       enoxaparin (LOVENOX) injection 110 mg  110 mg Subcutaneous Q12H Wendee Beavers, RPH   110 mg at 09/27/22 1101   escitalopram (LEXAPRO) tablet 20 mg  20 mg Oral Daily Ethelda Chick, PA-C   20 mg at 09/27/22 1100   ezetimibe (ZETIA) tablet 10 mg  10 mg Oral Daily Ethelda Chick, PA-C   10 mg at 78/29/56 2130   folic acid (FOLVITE) tablet 2 mg  2 mg Oral Daily Ethelda Chick, PA-C   2 mg at 09/27/22 1059   hydrALAZINE (APRESOLINE) tablet 50 mg  50 mg Oral TID Ethelda Chick, PA-C   50 mg at 09/27/22 1100   HYDROmorphone (DILAUDID) injection 0.5-1 mg  0.5-1 mg Intravenous Q4H PRN Ethelda Chick, PA-C   1 mg at 09/27/22 0130   insulin aspart (novoLOG) injection 0-15 Units  0-15 Units Subcutaneous TID WC McBane, Maylene Roes, PA-C  2 Units at 09/27/22 1323   insulin aspart (novoLOG) injection 4 Units  4 Units Subcutaneous TID WC Ethelda Chick, PA-C   4 Units at 09/27/22 1323   insulin glargine-yfgn (SEMGLEE) injection 42 Units  42 Units Subcutaneous QHS Ethelda Chick, [place]   42 Units at 09/26/22 2241   irbesartan (AVAPRO) tablet 300 mg  300 mg Oral Daily Ethelda Chick, PA-C   300 mg at 09/27/22 1100   lactated ringers 1,000 mL with potassium chloride 20 mEq infusion   Intravenous Continuous Ethelda Chick, PA-C 10 mL/hr at 09/26/22 2124 New Bag at 09/26/22 2124   latanoprost (XALATAN) 0.005 % ophthalmic solution 1 drop  1 drop Both Eyes QHS Ethelda Chick, PA-C   1 drop at 09/26/22 2126   levothyroxine (SYNTHROID) tablet 125 mcg  125 mcg Oral Once per day on Mon Tue Wed Thu Fri Ethelda Chick, PA-C   125 mcg at 09/27/22 0535   loratadine (CLARITIN) tablet 10 mg  10  mg Oral Daily Ethelda Chick, PA-C   10 mg at 09/27/22 1100   magnesium citrate solution 1 Bottle  1 Bottle Oral Once PRN Ethelda Chick, PA-C       methocarbamol (ROBAXIN) tablet 500 mg  500 mg Oral Q6H PRN Ethelda Chick, PA-C   500 mg at 09/27/22 0310   Or   methocarbamol (ROBAXIN) 500 mg in dextrose 5 % 50 mL IVPB  500 mg Intravenous Q6H PRN McBane, Maylene Roes, PA-C       metoCLOPramide (REGLAN) tablet 5-10 mg  5-10 mg Oral Q8H PRN McBane, Maylene Roes, PA-C       Or   metoCLOPramide (REGLAN) injection 5-10 mg  5-10 mg Intravenous Q8H PRN McBane, Maylene Roes, PA-C       metoprolol succinate (TOPROL-XL) 24 hr tablet 25 mg  25 mg Oral Daily Ethelda Chick, PA-C   25 mg at 09/27/22 1100   ondansetron (ZOFRAN) tablet 4 mg  4 mg Oral Q6H PRN McBane, Maylene Roes, PA-C       Or   ondansetron (ZOFRAN) injection 4 mg  4 mg Intravenous Q6H PRN McBane, Maylene Roes, PA-C       oxyCODONE (Oxy IR/ROXICODONE) immediate release tablet 10-15 mg  10-15 mg Oral Q4H PRN Ethelda Chick, PA-C   10 mg at 09/27/22 0945   oxyCODONE (Oxy IR/ROXICODONE) immediate release tablet 5-10 mg  5-10 mg Oral Q4H PRN Ethelda Chick, PA-C   10 mg at 09/27/22 0310   polyethylene glycol (MIRALAX / GLYCOLAX) packet 17 g  17 g Oral Daily PRN McBane, Maylene Roes, PA-C       predniSONE (DELTASONE) tablet 10 mg  10 mg Oral Daily Ethelda Chick, PA-C   10 mg at 09/27/22 1058   sulfaSALAzine (AZULFIDINE) tablet 1,500 mg  1,500 mg Oral BID Ethelda Chick, PA-C   1,500 mg at 09/27/22 1058   zolpidem (AMBIEN) tablet 5 mg  5 mg Oral QHS PRN Ethelda Chick, PA-C         Discharge Medications: Please see discharge summary for a list of discharge medications.  Relevant Imaging Results:  Relevant Lab Results:   Additional Information SS#: [id number], pt is vaccinated for covid with one booster.  Joanne Chars, LCSW

## 2022-09-27 NOTE — Progress Notes (Signed)
Mobility Specialist Progress Note   09/27/22 1800  Mobility  Activity Transferred from chair to bed;Transferred to/from [facility]  Level of Assistance +2 (takes two people) ([place])  Information systems manager Ambulated (ft) 2 ft  RLE Weight Bearing NWB  Activity Response Tolerated well  $Mobility charge 1 Mobility   Received pt on BSC after a successful void + flatulent's. Able to stand and pivot to bed w/ +2A modA w/ pt having better posture and adherence to WB precautions. Back to bed w/o fault, left w/ RN still present in room.    [place] Falling Mobility Specialist Please contact via SecureChat or  Rehab office at [phone]

## 2022-09-28 LAB — CBC
HCT: 25.5 % — ABNORMAL LOW (ref 36.0–46.0)
Hemoglobin: 7.9 g/dL — ABNORMAL LOW (ref 12.0–15.0)
MCH: 28.9 pg (ref 26.0–34.0)
MCHC: 31 g/dL (ref 30.0–36.0)
MCV: 93.4 fL (ref 80.0–100.0)
Platelets: 148 10*3/uL — ABNORMAL LOW (ref 150–400)
RBC: 2.73 MIL/uL — ABNORMAL LOW (ref 3.87–5.11)
RDW: 15.7 % — ABNORMAL HIGH (ref 11.5–15.5)
WBC: 7.2 10*3/uL (ref 4.0–10.5)
nRBC: 0 % (ref 0.0–0.2)

## 2022-09-28 LAB — GLUCOSE, CAPILLARY
Glucose-Capillary: 76 mg/dL (ref 70–99)
Glucose-Capillary: 93 mg/dL (ref 70–99)
Glucose-Capillary: 167 mg/dL — ABNORMAL HIGH (ref 70–99)
Glucose-Capillary: 240 mg/dL — ABNORMAL HIGH (ref 70–99)

## 2022-09-28 MED ORDER — METHOCARBAMOL 750 MG PO TABS: 750.0000 mg | ORAL_TABLET | Freq: Three times a day (TID) | ORAL | 0 refills | Status: DC | PRN

## 2022-09-28 MED ORDER — GABAPENTIN 100 MG PO CAPS: 100.0000 mg | ORAL_CAPSULE | Freq: Three times a day (TID) | ORAL | 0 refills | Status: DC

## 2022-09-28 MED ORDER — OXYCODONE HCL 5 MG PO TABS: 5.0000 mg | ORAL_TABLET | Freq: Four times a day (QID) | ORAL | 0 refills | Status: DC | PRN

## 2022-09-28 MED ORDER — APIXABAN (ELIQUIS) VTE STARTER PACK (10MG AND 5MG): ORAL_TABLET | ORAL | 0 refills | Status: DC

## 2022-09-28 NOTE — Care Management Obs Status (Signed)
[facility] NOTIFICATION   Patient Details  Name: Jocelyn Kaufman MRN: [id number] Date of Birth: 1955/11/06   Medicare Observation Status Notification Given:  Yes    Joanne Chars, LCSW 09/28/2022, 9:56 AM

## 2022-09-28 NOTE — Progress Notes (Signed)
   ORTHOPAEDIC PROGRESS NOTE  s/p Procedure(s): REVISION REPAIR QUADRICEP TENDON LIGAMENT REPAIR/knee on 09/26/2022 with Dr. Griffin Basil  SUBJECTIVE: Reports mild moderate pain about operative site. Her heel feels better after splint was adjusted yesterday. She has a tender area on her left abdomen thinks due to Lovenox shots. She is frustrated because she does not want to have to get rid of purewick. She had difficulty with bedside commode and staff had to help her adjust. She sat in the chair yesterday. No chest pain. No SOB. No nausea/vomiting. No other complaints.  OBJECTIVE: PE: General: resting in hospital bed, NAD Abdomen: bruising left side of abdomen, tender to palpation here, likely due to Lovenox shot RLE: splint CDI, +EHL though remainder of motor difficult to test due to splint, sensation intact distally with warm well perfused foot, no pain w passive stretch, warm well perfused extremity   Vitals:   09/28/22 0427 09/28/22 0841  BP: 133/66 (!) 125/57  Pulse: 60 60  Resp: 18 16  Temp: 98.3 F (36.8 C) 98 F (36.7 C)  SpO2: 99% 99%   Stable post-op images.   ASSESSMENT: Jocelyn Kaufman is a 66 y.o. female doing well postoperatively. POD#2  PLAN: Weightbearing: TDWB in splint Insicional and dressing care: Dressings left intact until follow-up Did adjust her splint this morning to add more padding around her heel. She noted this was more comfortable afterwards Orthopedic device(s): None and Splint Showering: Hold for now VTE prophylaxis: Lovenox Pain control: PRN pain medications, minimize narcotics as able Follow - up plan: 2 weeks in office Dispo: SNF. PT/OT recommending SNF. TOC following. Waiting for bed. Will discharge to SNF upon bed availability. Discharge medications printed and placed in patient's chart Contact information:  Weekdays 8-5 Dr. Ophelia Charter, Noemi Chapel PA-C , After hours and holidays please check [email or web address] for group call information for [facility]  Noemi Chapel, PA-C 09/28/2022

## 2022-09-28 NOTE — Plan of Care (Signed)

## 2022-09-28 NOTE — Discharge Summary (Signed)
Patient ID: Jocelyn Kaufman MRN: [id number] DOB/AGE: 1956-07-15 66 y.o.  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 Tolerated procedure well.  Was kept for monitoring for pain control, medical monitoring postop, PT/OT and discharge planning. PT/OT recommending SNF. She was found to be stable for DC to SNF on 09/28/2022.  Patient was instructed on specific activity restrictions and all questions were answered.  Consults: PT/OT  Significant Diagnostic Studies: No additional pertinent studies  Treatments: Surgery  Discharge Exam: General: resting in hospital bed, NAD Abdomen: bruising left side of abdomen, tender to palpation here, likely due to Lovenox shot RLE: splint CDI, +EHL though remainder of motor difficult to test due to splint, sensation intact distally with warm well perfused foot, no pain w passive stretch, warm well perfused extremity  Disposition: Discharge disposition: 03-Skilled Nursing Facility     SNF  Discharge Instructions     Call MD for:   redness, tenderness, or signs of infection (pain, swelling, redness, odor or green/yellow discharge around incision site)   Complete by: As directed    Call MD for:  severe uncontrolled pain   Complete by: As directed    Call MD for:  temperature >100.4   Complete by: As directed    Diet - low sodium heart healthy   Complete by: As directed       Allergies as of 09/28/2022       Reactions   Semaglutide(0.25 Or 0.94m-dos) Other (See Comments)   Causes cataracts requiring surgery and impairing vision   Shellfish-derived Products Anaphylaxis, Swelling, Other (See Comments)   Patient avoids   Empagliflozin Itching, Swelling, Other (See Comments)   UTI sx's and yeast infections   Rho (d) Immune Globulin     Not available   Tramadol Hcl Other (See Comments)   hallucinations   Canagliflozin Other (See Comments)   Urinary incontinence   Latex Rash   Liraglutide Itching   Other reaction(s): itching all over   Minoxidil Rash   skin irritation   Omeprazole Rash   Percocet [oxycodone-acetaminophen] Nausea And Vomiting        Medication List     STOP taking these medications    acetaminophen 500 MG tablet Commonly known as: TYLENOL   enoxaparin 150 MG/ML injection Commonly known as: LOVENOX   HYDROcodone-acetaminophen 5-325 MG tablet Commonly known as: Norco   metaxalone 800 MG tablet Commonly known as: SKELAXIN       TAKE these medications  Accu-Chek Softclix Lancet Dev Kit See admin instructions.   Accu-Chek Softclix Lancets lancets use to check blood sugar   albuterol 108 (90 Base) MCG/ACT inhaler Commonly known as: VENTOLIN HFA Inhale two puffs every four to six hours as needed for cough or wheeze. What changed:  how much to take how to take this when to take this reasons to take this additional instructions   amLODipine 10 MG tablet Commonly known as: NORVASC Take 1 tablet (10 mg total) by mouth daily.   Apixaban Starter Pack (44m and  548m Commonly known as: ELIQUIS STARTER PACK Take one-41m46mablet twice daily. What changed: additional instructions   atorvastatin 40 MG tablet Commonly known as: LIPITOR Take 1 tablet (40 mg total) by mouth daily at 6 PM. What changed: when to take this   B-D ULTRAFINE III SHORT PEN 31G X 8 MM Misc Generic drug: Insulin Pen Needle SMARTSIG:1 Each SUB-Q Daily   CENTRUM SILVER PO Take 1 tablet by mouth daily with breakfast.   CHELATED IRON PO Take 18 mg by mouth daily.   ciclopirox 8 % solution Commonly known as: PENLAC Apply topically at bedtime. Apply over nail and surrounding skin. Apply daily over previous coat. After seven (7) days, may remove with alcohol and continue cycle.   cyanocobalamin 1000 MCG tablet Commonly known as: VITAMIN B12 Take 1,000 mcg by mouth daily.   EPINEPHrine 0.3 mg/0.3 mL Soaj injection Commonly known as: EpiPen 2-Pak Use as directed for life-threatening allergic reaction.   escitalopram 20 MG tablet Commonly known as: LEXAPRO Take 20 mg by mouth daily.   Eylea 2 MG/0.05ML Soln Generic drug: Aflibercept 2 mg by Intravitreal route every 3 (three) months.   ezetimibe 10 MG tablet Commonly known as: Zetia Take 1 tablet (10 mg total) by mouth daily.   fexofenadine 180 MG tablet Commonly known as: Allegra Allergy Take 1 tablet (180 mg total) by mouth daily.   folic acid 1 MG tablet Commonly known as: FOLVITE Take 2 mg by mouth daily.   gabapentin 100 MG capsule Commonly known as: Neurontin Take 1 capsule (100 mg total) by mouth 3 (three) times daily for 14 days. For pain.   hydrALAZINE 50 MG tablet Commonly known as: APRESOLINE Take 50 mg by mouth 3 (three) times daily.   hydrocortisone cream 1 % Apply 1 Application topically 2 (two) times daily as needed for itching.   insulin aspart 100 UNIT/ML FlexPen Commonly known as: NOVOLOG Inject 20 Units into the skin See admin instructions. Inject 20 units into the skin two to three  times a day before meals   ipratropium 0.06 % nasal spray Commonly known as: ATROVENT Place 1-2 sprays each nostril as needed up to 3 times a day What changed:  how much to take how to take this when to take this reasons to take this additional instructions   irbesartan 300 MG tablet Commonly known as: AVAPRO Take 1 tablet (300 mg total) by mouth daily.   latanoprost 0.005 % ophthalmic solution Commonly known as: XALATAN Place 1 drop into both eyes at bedtime.   lidocaine 4 % cream Commonly known as: LMX Apply 1 Application topically as needed (pain).   methocarbamol 750 MG tablet Commonly known as: Robaxin-750 Take 1 tablet (750 mg total) by mouth every 8 (eight) hours as needed for muscle spasms.   methotrexate (PF) 50 MG/2ML injection Inject 20 mg into the muscle every Wednesday. 0.8 ml   metoprolol succinate 25 MG 24 hr tablet Commonly known  as: TOPROL-XL Take 1 tablet (25 mg total) by mouth daily. May take additional half tablet as needed once daily for palpitations. What changed:  how much to take when to take this additional instructions   oxyCODONE 5 MG immediate release tablet Commonly known as: Roxicodone Take 1-2 tablets (5-10 mg total) by mouth every 6 (six) hours as needed for severe pain.   predniSONE 10 MG tablet Commonly known as: DELTASONE Take 10 mg by mouth daily.   Retacrit 2000 UNIT/ML injection Generic drug: epoetin alfa-epbx See admin instructions.   sulfaSALAzine 500 MG tablet Commonly known as: AZULFIDINE Take 3 tablets (1,500 mg total) by mouth 2 (two) times daily.   Synthroid 125 MCG tablet Generic drug: levothyroxine Take 125 mcg by mouth every Monday, Tuesday, Wednesday, Thursday, and Friday.   Toujeo Max SoloStar 300 UNIT/ML Solostar Pen Generic drug: insulin glargine (2 Unit Dial) Inject 46 Units into the skin daily. What changed:  how much to take when to take this   triamcinolone cream 0.1 % Commonly known as:  KENALOG Apply 1 Application topically 3 (three) times daily as needed (for itching- affected areas).   Trulicity 1.5 UX/8.3FX Sopn Generic drug: Dulaglutide Inject 1.5 mg into the skin every Tuesday.   Vitamin D3 125 MCG (5000 UT) Caps Take 5,000 Units by mouth daily.        Contact information for follow-up providers     Hiram Gash, MD. Call.   Specialty: Orthopedic Surgery Why: For suture removal in 2 weeks Contact information: [address] [place] [address] [phone]              Contact information for after-discharge care     Destination     [facility] Preferred SNF .   Service: Skilled Nursing Contact information: [place] Tolu [place], PA-C 09/28/2022

## 2022-09-28 NOTE — Plan of Care (Signed)
  Problem: Coping: Goal: Ability to adjust to condition or change in health will improve Outcome: Progressing   Problem: Fluid Volume: Goal: Ability to maintain a balanced intake and output will improve Outcome: Progressing   Problem: Nutritional: Goal: Maintenance of adequate nutrition will improve Outcome: Progressing   Problem: Pain Managment: Goal: General experience of comfort will improve Outcome: Progressing

## 2022-09-28 NOTE — Discharge Instructions (Addendum)
Ophelia Charter MD, MPH [place] [address], [address] [phone] (tel)   [phone] (fax)   POST-OPERATIVE INSTRUCTIONS - LOWER EXTREMITY   WOUND CARE Please keep splint clean dry and intact until followup.  You may shower on Post-Op Day #3.  You must keep splint dry during this process and may find that a plastic bag taped around the leg or alternatively a towel based bath may be a better option.   If you get your splint wet or if it is damaged please contact our clinic.  EXERCISES Due to your splint being in place you will not be able to bear weight through your extremity.   DO NOT PUT ANY WEIGHT ON YOUR OPERATIVE LEG You may put your toes does for balance ONLY Please use crutches or a walker to avoid weight bearing.   POST-OP MEDICATIONS- Multimodal approach to pain control In general your pain will be controlled with a combination of substances.  Prescriptions unless otherwise discussed are electronically sent to your pharmacy.  This is a carefully made plan we use to minimize narcotic use.      Acetaminophen - Non-narcotic pain medicine taken on a scheduled basis  Gabapentin - this is to help with nerve based pain, take on a scheduled basis Oxycodone - This is a strong narcotic, to be used only on an "as needed" basis for SEVERE pain. Robaxin- this is a muscle relaxer, take as needed for muscle spasms Eliquis - This medicine is used to minimize the risk of blood clots after surgery. You are resuming your standard dose as you were taking prior to surgery Zofran - take as needed for nausea   FOLLOW-UP If you develop a Fever (>101.5), Redness or Drainage from the surgical incision site, please call our office to arrange for an evaluation. Please call the office to schedule a follow-up appointment for your incision check if you do not already have one, 7-10 days post-operatively.  IF YOU HAVE ANY QUESTIONS, PLEASE FEEL FREE TO CALL OUR  OFFICE.  HELPFUL INFORMATION  If you had a block, it will wear off between 8-24 hrs postop typically.  This is period when your pain may go from nearly zero to the pain you would have had postop without the block.  This is an abrupt transition but nothing dangerous is happening.  You may take an extra dose of narcotic when this happens.  You should wean off your narcotic medicines as soon as you are able.  Most patients will be off or using minimal narcotics before their first postop appointment.   We suggest you use the pain medication the first night prior to going to bed, in order to ease any pain when the anesthesia wears off. You should avoid taking pain medications on an empty stomach as it will make you nauseous.  Do not drink alcoholic beverages or take illicit drugs when taking pain medications.  In most states it is against the law to drive while you are in a splint or sling.  And certainly against the law to drive while taking narcotics.  You may return to work/school in the next couple of days when you feel up to it.   Pain medication may make you constipated.  Below are a few solutions to try in this order: Decrease the amount of pain medication if you aren't having pain. Drink lots of decaffeinated fluids. Drink prune juice and/or each dried prunes  If the first 3 don't work start with  additional solutions Take Colace - an over-the-counter stool softener Take Senokot - an over-the-counter laxative Take Miralax - a stronger over-the-counter laxative  For more information including helpful videos and documents visit our website:   [email or web address]

## 2022-09-28 NOTE — TOC Progression Note (Addendum)
Transition of Care [facility]) - Progression Note    Patient Details  Name: Jocelyn Kaufman MRN: [id number] Date of Birth: 24-Oct-1956  Transition of Care [facility]) CM/SW Contact  Joanne Chars, LCSW Phone Number: 09/28/2022, 10:27 AM  Clinical Narrative:   Bed offers presented to pt, she asked for responses from [facility] and [facility].  Both offered beds, [facility] does not have bed until Monday.  Pt asked about private rooms, questions answered on that. Pt reviewing, no facility choice yet.   Auth submitted in [place], facility pending.   1045: level 2 passr docs uploaded to [facility].  1110: passr received: [id number]  5643: TC Nav[facility], they can approve pt, just need facility choice.  CSW spoke with pt, still no decision.  1400: Pt chooses [facility].  CSW called Nav[facility] to provide facility.  Waiting on approval info.  PA informed.  Pt informed.  1430: Message from [facility].  They have filled all their beds for the day, will not be able to accept pt until Monday.  PA informed.    1500: Auth approved by Everlene Balls: [id number], 5 days: 12/1-12/5.  Expected Discharge Plan: [place] Barriers to Discharge: Continued Medical Work up, SNF Pending bed offer  Expected Discharge Plan and Services Expected Discharge Plan: [place] In-house Referral: Clinical Social Work   Post Acute Care Choice: [place] Living arrangements for the past 2 months: Single Family Home                                       Social Determinants of Health (SDOH) Interventions    Readmission Risk Interventions     No data to display

## 2022-09-29 LAB — GLUCOSE, CAPILLARY
Glucose-Capillary: 151 mg/dL — ABNORMAL HIGH (ref 70–99)
Glucose-Capillary: 190 mg/dL — ABNORMAL HIGH (ref 70–99)
Glucose-Capillary: 179 mg/dL — ABNORMAL HIGH (ref 70–99)
Glucose-Capillary: 212 mg/dL — ABNORMAL HIGH (ref 70–99)

## 2022-09-29 NOTE — Progress Notes (Signed)
Subjective: 3 Days Post-Op Procedure(s) (LRB): REVISION REPAIR QUADRICEP TENDON (Right) LIGAMENT REPAIR/knee (Right) Patient reports pain as moderate.    Objective: Vital signs in last 24 hours: Temp:  [97.9 F (36.6 C)-99.2 F (37.3 C)] 99.2 F (37.3 C) (12/02 0738) Pulse Rate:  [65-75] 75 (12/02 0738) Resp:  [18-20] 19 (12/02 0738) BP: (126-195)/(55-74) 195/73 (12/02 0738) SpO2:  [97 %-99 %] 99 % (12/02 0738)  Intake/Output from previous day: 12/01 0701 - 12/02 0700 In: 360 [P.O.:360] Out: 2250 [Urine:2250] Intake/Output this shift: Total I/O In: -  Out: 2600 [Urine:2600]  Recent Labs    09/26/22 1914 09/27/22 0620 09/28/22 0749  HGB 10.1* 9.1* 7.9*   Recent Labs    09/27/22 0620 09/28/22 0749  WBC 8.3 7.2  RBC 3.17* 2.73*  HCT 30.0* 25.5*  PLT 165 148*   Recent Labs    09/26/22 1914  CREATININE 1.06*   No results for input(s): "LABPT", "INR" in the last 72 hours.  Neurovascular intact Sensation intact distally Incision: dressing C/D/I Wiggles toes R upper splint is well padded but causing some discomfort, adjusted ace wrap, may need rewrapped a bit looser once pain overall under better control.  Assessment/Plan: 3 Days Post-Op Procedure(s) (LRB): REVISION REPAIR QUADRICEP TENDON (Right) LIGAMENT REPAIR/knee (Right)  Weightbearing: TDWB in splint Insicional and dressing care: Dressings left intact until follow-up Did adjust her splint this morning to add more padding around her heel. She noted this was more comfortable afterwards Orthopedic device(s): None and Splint Showering: Hold for now VTE prophylaxis: Lovenox Pain control: PRN pain medications, minimize narcotics as able Follow - up plan: 2 weeks in office Dispo: SNF. PT/OT recommending SNF. TOC following. Waiting for bed. Will discharge to SNF upon bed availability. Discharge medications printed and placed in patient's chart      Chriss Czar 09/29/2022, 11:47 AM

## 2022-09-29 NOTE — Plan of Care (Signed)

## 2022-09-30 DIAGNOSIS — D62 Acute posthemorrhagic anemia: Secondary | ICD-10-CM | POA: Diagnosis not present

## 2022-09-30 DIAGNOSIS — R5381 Other malaise: Secondary | ICD-10-CM | POA: Insufficient documentation

## 2022-09-30 DIAGNOSIS — S76111S Strain of right quadriceps muscle, fascia and tendon, sequela: Secondary | ICD-10-CM | POA: Diagnosis not present

## 2022-09-30 DIAGNOSIS — S76111D Strain of right quadriceps muscle, fascia and tendon, subsequent encounter: Secondary | ICD-10-CM | POA: Diagnosis not present

## 2022-09-30 LAB — CBC
HCT: 23.3 % — ABNORMAL LOW (ref 36.0–46.0)
Hemoglobin: 7.3 g/dL — ABNORMAL LOW (ref 12.0–15.0)
MCH: 28.9 pg (ref 26.0–34.0)
MCHC: 31.3 g/dL (ref 30.0–36.0)
MCV: 92.1 fL (ref 80.0–100.0)
Platelets: 181 10*3/uL (ref 150–400)
RBC: 2.53 MIL/uL — ABNORMAL LOW (ref 3.87–5.11)
RDW: 15.5 % (ref 11.5–15.5)
WBC: 11.5 10*3/uL — ABNORMAL HIGH (ref 4.0–10.5)
nRBC: 0 % (ref 0.0–0.2)

## 2022-09-30 LAB — GLUCOSE, CAPILLARY
Glucose-Capillary: 103 mg/dL — ABNORMAL HIGH (ref 70–99)
Glucose-Capillary: 139 mg/dL — ABNORMAL HIGH (ref 70–99)
Glucose-Capillary: 172 mg/dL — ABNORMAL HIGH (ref 70–99)
Glucose-Capillary: 158 mg/dL — ABNORMAL HIGH (ref 70–99)

## 2022-09-30 LAB — CREATININE, SERUM
Creatinine, Ser: 1.1 mg/dL — ABNORMAL HIGH (ref 0.44–1.00)
GFR, Estimated: 55 mL/min — ABNORMAL LOW (ref >60)

## 2022-09-30 MED ORDER — HYDROCODONE-ACETAMINOPHEN 7.5-325 MG PO TABS
1.0000 | ORAL_TABLET | ORAL | Status: DC | PRN
  Administered 2022-09-30 (×2): 1 via ORAL
  Administered 2022-09-30 – 2022-10-01 (×2): 2 via ORAL
  Administered 2022-10-01 (×2): 1 via ORAL
  Administered 2022-10-01: 2 via ORAL
  Filled 2022-09-30 (×2): qty 2
  Filled 2022-09-30: qty 1
  Filled 2022-09-30 (×2): qty 2
  Filled 2022-09-30: qty 1

## 2022-09-30 MED ORDER — APIXABAN 5 MG PO TABS
5.0000 mg | ORAL_TABLET | Freq: Two times a day (BID) | ORAL | Status: DC
  Administered 2022-09-30 – 2022-10-04 (×8): 5 mg via ORAL
  Filled 2022-09-30 (×8): qty 1

## 2022-09-30 MED ORDER — HYDRALAZINE HCL 20 MG/ML IJ SOLN
5.0000 mg | INTRAMUSCULAR | Status: DC | PRN
  Filled 2022-09-30: qty 1

## 2022-09-30 MED ORDER — PREDNISONE 5 MG PO TABS
5.0000 mg | ORAL_TABLET | Freq: Every day | ORAL | Status: DC
  Administered 2022-10-01 – 2022-10-04 (×4): 5 mg via ORAL
  Filled 2022-09-30 (×4): qty 1

## 2022-09-30 MED ORDER — METHOCARBAMOL 750 MG PO TABS
750.0000 mg | ORAL_TABLET | Freq: Four times a day (QID) | ORAL | Status: DC
  Administered 2022-09-30 – 2022-10-04 (×16): 750 mg via ORAL
  Filled 2022-09-30 (×17): qty 1

## 2022-09-30 MED ORDER — METHOCARBAMOL 1000 MG/10ML IJ SOLN: 500.0000 mg | Freq: Four times a day (QID) | INTRAVENOUS | Status: DC

## 2022-09-30 MED ORDER — ACETAMINOPHEN 500 MG PO TABS: 500.0000 mg | ORAL_TABLET | Freq: Three times a day (TID) | ORAL | Status: DC

## 2022-09-30 MED ORDER — ACETAMINOPHEN 500 MG PO TABS
500.0000 mg | ORAL_TABLET | Freq: Three times a day (TID) | ORAL | Status: DC
  Administered 2022-09-30 – 2022-10-02 (×4): 500 mg via ORAL
  Filled 2022-09-30 (×7): qty 1

## 2022-09-30 NOTE — Progress Notes (Addendum)
Physical Therapy Treatment Patient Details Name: Jocelyn Kaufman MRN: [id number] DOB: Oct 06, 1956 Today's Date: 09/30/2022   History of Present Illness 66 yo female admitted 11/29 for Rt quad tendon repair revision due to falls. Initial injury in July with repair 05/30/22. PMH: DM, HTN, fibromyalgia, hypothyroidism, RA, CKD, afib, asthma, ORIF of RUE    PT Comments    Pt reporting being frustrated with not understanding plan and that in her opinion staff are not listening to her for her needs and frustrations. Pt with difficulty at times with word finding which increases her frustration. Pt with RA limiting functional mobility and use of bil UE to perform transfers and mobility with NWB RLE. Pt without sufficient strength to scoot for transfers and cannot maintain NWB in standing. PT educate for need to progress mobility and standing trials with therapy but she should not be trying to stand with nursing staff at this time, LPN and pt aware. Will continue to follow.    Recommendations for follow up therapy are one component of a multi-disciplinary discharge planning process, led by the attending physician.  Recommendations may be updated based on patient status, additional functional criteria and insurance authorization.  Follow Up Recommendations  Skilled nursing-short term rehab (<3 hours/day) Can patient physically be transported by private vehicle: No   Assistance Recommended at Discharge Frequent or constant Supervision/Assistance  Patient can return home with the following Two people to help with walking and/or transfers   Equipment Recommendations  None recommended by PT    Recommendations for Other Services       Precautions / Restrictions Precautions Precautions: Fall Required Braces or Orthoses: Splint/Cast Knee Immobilizer - Right: On at all times Restrictions RLE Weight Bearing: Non weight bearing Other Position/Activity Restrictions: TDWB for balance only but pt unable to  maintain     Mobility  Bed Mobility Overal bed mobility: Needs Assistance Bed Mobility: Supine to Sit, Sit to Supine     Supine to sit: Mod assist Sit to supine: Mod assist   General bed mobility comments: mod assist for moving RLE with use of rail to pivot to left side of bed with increased time and cues. Return to bed with assist to lift and control RLE. Pt able to perform minimal scooting along EOB to [facility] but unable to progress scooting to acheive lateral transfer to drop arm chair    Transfers Overall transfer level: Needs assistance   Transfers: Sit to/from Stand Sit to Stand: Mod assist, From elevated surface           General transfer comment: pt able to stand from bed to RW with RLE on P.T. foot to monitor weighbearing with pt putting too much weight through RLE and needing to return to sitting. Physical assist to rise and stabilize    Ambulation/Gait               General Gait Details: unable   Stairs             Wheelchair Mobility    Modified Rankin (Stroke Patients Only)       Balance Overall balance assessment: History of Falls, Needs assistance   Sitting balance-Leahy Scale: Fair     Standing balance support: Bilateral upper extremity supported Standing balance-Leahy Scale: Poor                              Cognition Arousal/Alertness: Awake/alert Behavior During Therapy: WFL for tasks assessed/performed Overall  Cognitive Status: Within Functional Limits for tasks assessed                                          Exercises General Exercises - Lower Extremity Straight Leg Raises: PROM, Right, Supine, 10 reps    General Comments        Pertinent Vitals/Pain Pain Assessment Pain Score: 5  Pain Location: Right thigh with movement Pain Descriptors / Indicators: Aching, Sore Pain Intervention(s): Limited activity within patient's tolerance, Repositioned, Monitored during session    Home Living                           Prior Function            PT Goals (current goals can now be found in the care plan section) Progress towards PT goals: Progressing toward goals    Frequency    Min 3X/week      PT Plan Current plan remains appropriate    Co-evaluation              AM-PAC PT "6 Clicks" Mobility   Outcome Measure  Help needed turning from your back to your side while in a flat bed without using bedrails?: A Lot Help needed moving from lying on your back to sitting on the side of a flat bed without using bedrails?: A Lot Help needed moving to and from a bed to a chair (including a wheelchair)?: Total Help needed standing up from a chair using your arms (e.g., wheelchair or bedside chair)?: Total Help needed to walk in hospital room?: Total Help needed climbing 3-5 steps with a railing? : Total 6 Click Score: 8    End of Session Equipment Utilized During Treatment: Gait belt Activity Tolerance: Patient limited by pain Patient left: in bed;with call bell/phone within reach;with nursing/sitter in room Nurse Communication: Mobility status;Precautions;Weight bearing status;Need for lift equipment PT Visit Diagnosis: Muscle weakness (generalized) (M62.81);History of falling (Z91.81);Other abnormalities of gait and mobility (R26.89)     Time: 3825-0539 PT Time Calculation (min) (ACUTE ONLY): 41 min  Charges:  $Therapeutic Activity: 38-52 mins                     Bayard Males, PT Acute Rehabilitation Services Office: [phone]    Lamarr Lulas 09/30/2022, 12:37 PM

## 2022-09-30 NOTE — Assessment & Plan Note (Signed)
-  Worsening anemia since admission, likely post-operative -Will recheck in AM - may need transfusion if it is continuing to drift down and is <7

## 2022-09-30 NOTE — Assessment & Plan Note (Signed)
-  Body mass index is 40.56 kg/m..  -Weight loss should be encouraged -Outpatient PCP/bariatric medicine f/u encouraged

## 2022-09-30 NOTE — Assessment & Plan Note (Signed)
-  She reports good baseline control but suboptimal control while hospitalized -She thinks her hydralazine was only being administered BID but is now back to TID -Continue home meds - amlodipine, hydralazine, irbesartan, Toprol XL -Will add prn IV hydralazine -Once her pain is better controlled and she is more active, her BP is likely to return to near baseline

## 2022-09-30 NOTE — Assessment & Plan Note (Signed)
-  She is on chronic prednisone and methotrexate -Since these are unchanged from home doses, they are unlikely to be contributing to current issues

## 2022-09-30 NOTE — Progress Notes (Signed)
Orthopaedic Trauma Service Progress Note  Patient ID: JASE HIMMELBERGER MRN: [id number] DOB/AGE: August 27, 1956 66 y.o.  Subjective:  Moderate pain R leg Frustrated  States she getting conflicted information with weightbearing status and what she is allowed/not allowed to do  SBPs have been elevated  Pre--op SBPs 130-150's Immediately post op pressures were resonable, 11/30 she had first SBP reading in the 190's Pt on 3 BP meds, these were all restarted post op and she has been receiving them  States no recent changes have been made Denies headache or CP   Possible elevated BPs related to pain control   Was started on prednisone 2 weeks ago for RA flare.  States she should be down to 5 mg daily now ( I adjusted this)  PMHx notes pseudo-cushings, thyroid disease (hypothyroid on synthroid), CKD  Right leg is very heavy and difficult to move Having a hard time mobilizing and maintaining TDWB on R leg     ROS As bove  Objective:   VITALS:   Vitals:   09/29/22 1500 09/29/22 2221 09/30/22 0749 09/30/22 1052  BP: (!) 181/70 (!) 142/60 (!) 193/74 (!) 186/71  Pulse: 81 79 81   Resp: '18 18 18   '$ Temp: 100.1 F (37.8 C) 99.2 F (37.3 C) 99.2 F (37.3 C)   TempSrc: Oral Oral Oral   SpO2: 95% 94% 98%   Weight:      Height:        Estimated body mass index is 40.56 kg/m as calculated from the following:   Height as of this encounter: 5' 4.5" (1.638 m).   Weight as of this encounter: 108.9 kg.   Intake/Output      12/02 0701 12/03 0700 12/03 0701 12/04 0700   P.O. 1080 240   Total Intake(mL/kg) 1080 (9.9) 240 (2.2)   Urine (mL/kg/hr) 3200 (1.2)    Stool 0    Total Output 3200    Net -2120 +240        Urine Occurrence 2 x    Stool Occurrence 1 x      LABS  Results for orders placed or performed during the hospital encounter of 09/26/22 (from the past 24 hour(s))  Glucose, capillary     Status:  Abnormal   Collection Time: 09/29/22 12:26 PM  Result Value Ref Range   Glucose-Capillary 190 (H) 70 - 99 mg/dL  Glucose, capillary     Status: Abnormal   Collection Time: 09/29/22  4:29 PM  Result Value Ref Range   Glucose-Capillary 179 (H) 70 - 99 mg/dL  Glucose, capillary     Status: Abnormal   Collection Time: 09/29/22  7:31 PM  Result Value Ref Range   Glucose-Capillary 212 (H) 70 - 99 mg/dL  Glucose, capillary     Status: Abnormal   Collection Time: 09/30/22  7:46 AM  Result Value Ref Range   Glucose-Capillary 103 (H) 70 - 99 mg/dL  CBC     Status: Abnormal   Collection Time: 09/30/22  8:26 AM  Result Value Ref Range   WBC 11.5 (H) 4.0 - 10.5 K/uL   RBC 2.53 (L) 3.87 - 5.11 MIL/uL   Hemoglobin 7.3 (L) 12.0 - 15.0 g/dL   HCT 23.3 (L) 36.0 - 46.0 %   MCV 92.1 80.0 - 100.0 fL   MCH  28.9 26.0 - 34.0 pg   MCHC 31.3 30.0 - 36.0 g/dL   RDW 15.5 11.5 - 15.5 %   Platelets 181 150 - 400 K/uL   nRBC 0.0 0.0 - 0.2 %  Creatinine, serum     Status: Abnormal   Collection Time: 09/30/22  8:26 AM  Result Value Ref Range   Creatinine, Ser 1.10 (H) 0.44 - 1.00 mg/dL   GFR, Estimated 55 (L) >60 mL/min     PHYSICAL EXAM:   Gen: in bed, uncomfortable appearing  Lungs: unlaboed Cardiac: s1 and s2 Ext:       Right Lower Extremity   LLS fitting well   Toes warm  + DP pulse  EHL, FHL, lesser toe motor intact  No pain with passive stretching   Skin looks good around splint    Assessment/Plan: 4 Days Post-Op   Principal Problem:   Quadriceps muscle rupture, right, sequela   Anti-infectives (From admission, onward)    Start     Dose/Rate Route Frequency Ordered Stop   09/27/22 0600  ceFAZolin (ANCEF) IVPB 2g/100 mL premix        2 g 200 mL/hr over 30 Minutes Intravenous On call to O.R. 09/26/22 1254 09/26/22 1520   09/26/22 2130  ceFAZolin (ANCEF) IVPB 2g/100 mL premix        2 g 200 mL/hr over 30 Minutes Intravenous Every 6 hours 09/26/22 1841 09/28/22 1130   09/26/22  1526  tobramycin (NEBCIN) powder  Status:  Discontinued          As needed 09/26/22 1526 09/26/22 1645   09/26/22 1526  vancomycin (VANCOCIN) powder  Status:  Discontinued          As needed 09/26/22 1526 09/26/22 1645     .  POD/HD#: 80  66 year old female with right quadriceps tendon rerupture s/p repair  -Revision right quadriceps tendon repair  Touchdown weightbearing right leg with long-leg splint   Order a cast shoe so her splint does not slide on the floor  Continue with PT and OT  Ice and elevate for swelling and pain control  Splint is to remain on until follow-up with Dr. Griffin Basil   High risk for complications including rerupture  - Pain management:  Scheduled Tylenol and scheduled Robaxin  Change opioid to Norco  - ABL anemia/Hemodynamics  Patient with elevated systolic pressures over the last several days  Possible that this is pain mediated however she does have several chronic medical conditions that could be contributory  Will discuss with medicine service  - Medical issues   As above  Continue home medications - DVT/PE prophylaxis:  Lovenox  - FEN/GI prophylaxis/Foley/Lines:  Carb mod diet  - Dispo:  Optimize pain control  Medicine consult for elevated SBP  Likely SNF tomorrow     Jari Pigg, PA-C [phone] (C) 09/30/2022, 11:45 AM  [facility] [place] [address] [phone] Jenetta [phone] (F)    After 5pm and on the weekends please log on to Amion, go to orthopaedics and the look under the [facility] Call for the provider(s) on call. You can also call our office at [phone] and then follow the prompts to be connected to the call team.  Patient ID: Opal Sidles, female   DOB: Nov 30, 1955, 66 y.o.   MRN: [id number]

## 2022-09-30 NOTE — Progress Notes (Signed)
ANTICOAGULATION CONSULT NOTE - follow up  Pharmacy Consult for Lovenox Indication: pulmonary embolus  Allergies  Allergen Reactions   Semaglutide(0.25 Or 0.'5mg'$ -Dos) Other (See Comments)    Causes cataracts requiring surgery and impairing vision    Shellfish-Derived Products Anaphylaxis, Swelling and Other (See Comments)    Patient avoids   Empagliflozin Itching, Swelling and Other (See Comments)    UTI sx's and yeast infections   Rho (D) Immune Globulin      Not available   Tramadol Hcl Other (See Comments)    hallucinations   Canagliflozin Other (See Comments)    Urinary incontinence    Latex Rash   Liraglutide Itching    Other reaction(s): itching all over   Minoxidil Rash    skin irritation   Omeprazole Rash   Percocet [Oxycodone-Acetaminophen] Nausea And Vomiting    Patient Measurements: Height: 5' 4.5" (163.8 cm) Weight: 108.9 kg (240 lb) IBW/kg (Calculated) : 55.85  Vital Signs: Temp: 99.9 F (37.7 C) (12/03 1239) Temp Source: Oral (12/03 1239) BP: 187/74 (12/03 1239) Pulse Rate: 80 (12/03 1239)  Labs: Recent Labs    09/28/22 0749 09/30/22 0826  HGB 7.9* 7.3*  HCT 25.5* 23.3*  PLT 148* 181  CREATININE  --  1.10*     Estimated Creatinine Clearance: 61.2 mL/min (A) (by C-G formula based on SCr of 1.1 mg/dL (H)).   Assessment: Pt with recent PE in 10/23. She has been on Eliquis prior to conversion to Lovenox for surgery bridging. She is s/p Revision quadriceps repair with allograft 11/29. Ok to start Lovenox  on 11/30 AM per Dr. Griffin Basil and continue while here.   12/3 Update:  Continues o Lovenox 110 mg SQ BID for history of PE. Scr stable 1.06>1.10 Hgb 10.5>9.1>7.9>7.3, pltc stable within normal.  No bleeding reported. Ortho plans to resume Eliquis at discharge   Goal of Therapy:  Anti-Xa level 0.6-1 units/ml 4hrs after LMWH dose given Monitor platelets by anticoagulation protocol: Yes   Plan:  Continue Lovenox 110 mg SQ BID  CBC  q3days Monitor for s/sx of bleeding  Nicole Cella, RPh Clinical Pharmacist 09/30/2022 3:56 PM

## 2022-09-30 NOTE — Assessment & Plan Note (Signed)
-  Continue atorvastatin, Zetia

## 2022-09-30 NOTE — Assessment & Plan Note (Addendum)
-  s/p repair -Pain control with hydrocodone -She has been requiring Dilaudid for breakthrough but will not be able to get this at SNF so likely needs to control with PO options only -Hopefully dc to SNF tomorrow

## 2022-09-30 NOTE — Consult Note (Signed)
Initial Consultation Note   Patient: Jocelyn Kaufman NKN:[id number] DOB: 1956-01-08 PCP: Jocelyn Pretty, MD DOA: 09/26/2022 DOS: the patient was seen and examined on 09/30/2022 Primary service: Jocelyn Gash, MD  Referring physician: [place] Reason for consult: Post-op day 4 and having uncontrolled BPs since 11/30.  180-190s despite home BP meds.  Not complaining of symptoms.  Will need SNF placement.    Assessment and Plan: * Quadriceps muscle rupture, right, sequela -s/p repair -Pain control with hydrocodone -She has been requiring Dilaudid for breakthrough but will not be able to get this at SNF so likely needs to control with PO options only -Hopefully dc to SNF tomorrow  ABLA (acute blood loss anemia) -Worsening anemia since admission, likely post-operative -Will recheck in AM - may need transfusion if it is continuing to drift down and is <7  Pulmonary embolus ([place]) -Admitted for a clot in October -Has been on Lovenox post-operatively -She should be appropriate to transition back to Eliquis as this time  Morbid obesity ([place]) -Body mass index is 40.56 kg/m..  -Weight loss should be encouraged -Outpatient PCP/bariatric medicine f/u encouraged   Hypothyroid -Continue Synthroid  Chronic kidney disease, stage 3a ([place]) -Appears to be stable at this time -Attempt to avoid nephrotoxic medications  Rheumatoid arthritis ([place]) -She is on chronic prednisone and methotrexate -Since these are unchanged from home doses, they are unlikely to be contributing to current issues  Mixed hyperlipidemia -Continue atorvastatin, Zetia  Essential hypertension -She reports good baseline control but suboptimal control while hospitalized -She thinks her hydralazine was only being administered BID but is now back to TID -Continue home meds - amlodipine, hydralazine, irbesartan, Toprol XL -Will add prn IV hydralazine -Once her pain is better controlled and she is more active, her BP  is likely to return to near baseline  Diabetes mellitus ([place]) -Recent A1c was 5.6, indicating very good control -Resume Trulicity at dc -Continue glargine -Cover with moderate-scale SSI        TRH will continue to follow the patient.  HPI: Jocelyn Kaufman is a 66 y.o. female with past medical history of depression, DM, HTN, HLD, RA, afib on Eliquis, and hypothyroidism who presented on 11/28 with R quad tendon rupture with instability; she underwent revision quadriceps repair with allograft as well as medial and lateral retinacular repair.  She reports that she is doing okay but is worried about her BP. Her usual BP is 130-140 and here has been 180-190; she does acknowledge that pain may be contributing.  She is scheduled to go to [facility] tomorrow if her medical problems are controlled.  She would like to change back to Eliquis from Lovenox and would told that would happen post-operatively.    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   Urticaria    Past  Surgical History:  Procedure Laterality Date   ADENOIDECTOMY     APPENDECTOMY     biopsy Left 12/2021   shin   biospy Right 04/2021   R 5th digit   biospy Left 04/2021   foot plantar surface   colonoscopy and endoscopy  2021   CYST REMOVAL LEG Right    Removed from right thigh joint   EYE SURGERY Bilateral 09/2021   cataract   foot surgery Left 09/2021   Excision lesion plantar left  foot   LIGAMENT REPAIR Right 09/26/2022   Procedure: LIGAMENT REPAIR/knee;  Surgeon: Jocelyn Gash, MD;  Location: [facility];  Service: Orthopedics;  Laterality: Right;   ORIF HUMERUS FRACTURE Right 07/09/2018   Procedure: OPEN REDUCTION INTERNAL FIXATION (ORIF) HUMERAL SHAFT FRACTURE;  Surgeon: Jocelyn Gash, MD;  Location: [place] [facility];  Service: Orthopedics;  Laterality: Right;   QUADRICEPS TENDON REPAIR Right 09/26/2022   Procedure: REVISION REPAIR QUADRICEP TENDON;  Surgeon: Jocelyn Gash, MD;  Location: [facility];  Service: Orthopedics;  Laterality: Right;   TONSILLECTOMY     torn right knee tendon repair Right    2023   TOTAL VAGINAL HYSTERECTOMY     UMBILICAL HERNIA REPAIR     Social History:  reports that she has never smoked. She has never used smokeless tobacco. She reports that she does not currently use alcohol. She reports that she does not use drugs.  Allergies  Allergen Reactions   Semaglutide(0.25 Or 0.67m-Dos) Other (See Comments)    Causes cataracts requiring surgery and impairing vision    Shellfish-Derived Products Anaphylaxis, Swelling and Other (See Comments)    Patient avoids   Empagliflozin Itching, Swelling and Other (See Comments)    UTI sx's and yeast infections   Rho (D) Immune Globulin      Not available   Tramadol Hcl Other (See Comments)    hallucinations   Canagliflozin Other (See Comments)    Urinary incontinence    Latex Rash   Liraglutide Itching    Other reaction(s): itching all over   Minoxidil Rash    skin irritation   Omeprazole Rash   Percocet [Oxycodone-Acetaminophen] Nausea And Vomiting    Family History  Problem Relation Age of Onset   Hypertension Mother    Stroke Mother    Kidney failure Mother    Heart disease Mother        heart murmur   Glaucoma Mother    Stroke Father    COPD Father    Cancer - Prostate Father    Colon cancer Father    Diabetes Sister    Thyroid cancer Sister    Epilepsy Sister    Ovarian cancer Paternal Aunt      Prior to Admission medications   Medication Sig Start Date End Date Taking? Authorizing Provider  acetaminophen (TYLENOL) 500 MG tablet Take 1,000 mg by mouth every 6 (six) hours as needed for mild pain or headache.   Yes [provider]  Aflibercept (EYLEA) 2 MG/0.05ML SOLN 2 mg by Intravitreal route every 3 (three) months. 04/25/20  Yes GNicholas Lose MD  albuterol (VENTOLIN HFA) 108 (90 Base) MCG/ACT inhaler Inhale two puffs every four to six hours as needed for cough or wheeze. Patient taking differently: Inhale 1-2 puffs into the lungs every 6 (six) hours as needed for wheezing. 10/05/21  Yes Padgett, SRae Halsted MD  amLODipine (NORVASC) 10 MG tablet Take 1 tablet (10 mg total) by mouth daily. 02/01/22  Yes GNicholas Lose MD  atorvastatin (LIPITOR) 40 MG tablet Take 1 tablet (40 mg total) by mouth daily at 6 PM. Patient taking differently: Take 40 mg by mouth daily. 07/12/18  Yes Dana Allan I, MD  CHELATED IRON PO Take 18 mg by mouth daily.   Yes [provider]  Cholecalciferol (VITAMIN D3) 125 MCG (5000 UT) CAPS Take 5,000 Units by mouth daily.   Yes [provider]  ciclopirox (PENLAC) 8 % solution Apply topically at bedtime. Apply over nail and surrounding skin. Apply daily over previous coat. After seven (7) days, may remove with alcohol and continue cycle. 11/09/21  Yes McDonald, Stephan Minister, DPM  Dulaglutide (TRULICITY) 1.5 BP/1.0CH SOPN Inject 1.5 mg into the skin every Tuesday.   Yes [provider]  enoxaparin (LOVENOX) 150 MG/ML injection Inject 1 mL (150 mg total) into the skin every 12 (twelve) hours. Self administer twice a day for 2 days starting 09/24/22. 09/21/22  Yes Nicholas Lose, MD  EPINEPHrine (EPIPEN 2-PAK) 0.3 mg/0.3 mL IJ SOAJ injection Use as directed for life-threatening allergic reaction. 04/05/22  Yes Kennith Gain, MD  epoetin alfa-epbx (RETACRIT) 2000 UNIT/ML injection See admin instructions.   Yes [provider]  escitalopram (LEXAPRO) 20 MG tablet Take 20 mg by mouth daily.   Yes [provider]  ezetimibe (ZETIA) 10 MG tablet Take 1 tablet (10 mg total) by mouth daily. 04/25/20  Yes Nicholas Lose, MD  fexofenadine (ALLEGRA ALLERGY) 180 MG tablet Take 1 tablet (180 mg total) by mouth daily. 11/21/21  Yes Nicholas Lose, MD  folic acid (FOLVITE) 1 MG tablet Take 2 mg by mouth daily.   Yes [provider]  gabapentin (NEURONTIN) 100 MG capsule Take 1 capsule (100 mg total) by mouth 3 (three) times daily for 14 days. For pain. 09/28/22 10/12/22 Yes McBane, Maylene Roes, PA-C  hydrALAZINE (APRESOLINE) 50 MG tablet Take 50 mg by mouth 3 (three) times daily.   Yes [provider]  HYDROcodone-acetaminophen (NORCO) 5-325 MG tablet Take 1 tablet by mouth every 6 (six) hours as needed for severe pain. 08/31/22  Yes Dede Query T, PA-C  hydrocortisone cream 1 % Apply 1 Application topically 2 (two) times daily as needed for itching.   Yes [provider]  insulin aspart (NOVOLOG) 100 UNIT/ML FlexPen Inject 20 Units into the skin See admin instructions. Inject 20 units into the skin two to three times a day before meals   Yes [provider]  insulin glargine, 2 Unit Dial, (TOUJEO MAX SOLOSTAR) 300 UNIT/ML Solostar Pen Inject 46 Units into the skin daily. Patient taking differently: Inject 42 Units into the skin at bedtime. 02/02/21  Yes Nicholas Lose, MD  ipratropium (ATROVENT) 0.06 % nasal spray Place 1-2 sprays each nostril as needed up to 3 times a day Patient taking differently: Place 1-2 sprays into both nostrils 3 (three) times daily as needed for rhinitis. 04/05/22  Yes Padgett, Rae Halsted, MD  irbesartan (AVAPRO) 300 MG tablet Take 1 tablet (300 mg total) by mouth daily. 02/01/22  Yes Nicholas Lose, MD  latanoprost (XALATAN) 0.005 % ophthalmic solution Place 1 drop into both eyes at bedtime.   Yes [provider]  lidocaine (LMX) 4 % cream Apply  1 Application topically as needed (pain).   Yes [provider]  methocarbamol (ROBAXIN-750) 750 MG tablet Take 1 tablet (750 mg total) by mouth every 8 (eight) hours as needed for muscle spasms. 09/28/22  Yes McBane, Maylene Roes, PA-C  Methotrexate Sodium (METHOTREXATE, PF,)  50 MG/2ML injection Inject 20 mg into the muscle every Wednesday. 0.8 ml 06/26/22  Yes [provider]  metoprolol succinate (TOPROL-XL) 25 MG 24 hr tablet Take 1 tablet (25 mg total) by mouth daily. May take additional half tablet as needed once daily for palpitations. Patient taking differently: Take 12.5-25 mg by mouth See admin instructions. Take 25 mg by mouth once a day and an additional 12.5 mg (half tablet) as needed once daily for palpitations 05/07/22  Yes Loel Dubonnet, NP  Multiple Vitamins-Minerals (CENTRUM SILVER PO) Take 1 tablet by mouth daily with breakfast.   Yes [provider]  oxyCODONE (ROXICODONE) 5 MG immediate release tablet Take 1-2 tablets (5-10 mg total) by mouth every 6 (six) hours as needed for severe pain. 09/28/22  Yes McBane, Maylene Roes, PA-C  predniSONE (DELTASONE) 10 MG tablet Take 10 mg by mouth daily.   Yes [provider]  sulfaSALAzine (AZULFIDINE) 500 MG tablet Take 3 tablets (1,500 mg total) by mouth 2 (two) times daily. 11/21/21  Yes Nicholas Lose, MD  SYNTHROID 125 MCG tablet Take 125 mcg by mouth every Monday, Tuesday, Wednesday, Thursday, and Friday.   Yes [provider]  triamcinolone cream (KENALOG) 0.1 % Apply 1 Application topically 3 (three) times daily as needed (for itching- affected areas). 08/03/22  Yes [provider]  vitamin B-12 (CYANOCOBALAMIN) 1000 MCG tablet Take 1,000 mcg by mouth daily.   Yes [provider]  Accu-Chek Softclix Lancets lancets use to check blood sugar 04/26/21   [provider]  APIXABAN (ELIQUIS) VTE STARTER PACK (10MG AND 5MG) Take one-26m tablet twice daily. 09/28/22   McBane,  CMaylene Roes PA-C  B-D ULTRAFINE III SHORT PEN 31G X 8 MM MISC SMARTSIG:1 Each SUB-Q Daily 02/12/20   [provider]  Lancets Misc. (ACCU-CHEK SOFTCLIX LANCET DEV) KIT See admin instructions. 04/25/21   [provider]  metaxalone (SKELAXIN) 800 MG tablet Take 800 mg by mouth 3 (three) times daily as needed for muscle spasms.    [provider]    Physical Exam: Vitals:   09/30/22 0749 09/30/22 1052 09/30/22 1239 09/30/22 1719  BP: (!) 193/74 (!) 186/71 (!) 187/74 (!) 169/74  Pulse: 81  80 88  Resp: 18  17   Temp: 99.2 F (37.3 C)  99.9 F (37.7 C) 99.6 F (37.6 C)  TempSrc: Oral  Oral Oral  SpO2: 98%  97% 97%  Weight:      Height:       General:  Appears calm and comfortable and is in NAD Eyes:   EOMI, normal lids, iris ENT:  grossly normal hearing, lips & tongue, mmm Neck:  no LAD, masses or thyromegaly Cardiovascular:  RRR, no m/r/g. No LE edema.  Respiratory:   CTA bilaterally with no wheezes/rales/rhonchi.  Normal respiratory effort. Abdomen:  soft, NT, ND Skin:  no rash or induration seen on limited exam Musculoskeletal:  grossly normal tone BUE/BLE, good ROM, no bony abnormality Psychiatric:  blunted mood and affect, speech fluent and appropriate, AOx3 Neurologic:  CN 2-12 grossly intact, moves all extremities in coordinated fashion   Radiological Exams on Admission: Independently reviewed - see discussion in A/P where applicable  No results found.  EKG: none   Labs on Admission: I have personally reviewed the available labs and imaging studies at the time of the admission.  Pertinent labs:    Stable stage 3a CKD WBC 11.5 Hgb 7.3; 7.9 on 12/1, 9.1 on 11/30, 11.2 on 11/22  Family Communication: None present Primary team communication: I spoke with the covering orthopedics team at the time of the consult  Thank you very much for involving [place] in the care of your patient.  Author: Karmen Bongo, MD 09/30/2022 7:32 PM  For on  call review [email or web address].

## 2022-09-30 NOTE — Assessment & Plan Note (Signed)
-  Recent A1c was 5.6, indicating very good control -Resume Trulicity at dc -Continue glargine -Cover with moderate-scale SSI

## 2022-09-30 NOTE — Assessment & Plan Note (Signed)
Continue Synthroid °

## 2022-09-30 NOTE — Plan of Care (Signed)
  Problem: Education: Goal: Ability to describe self-care measures that may prevent or decrease complications (Diabetes Survival Skills Education) will improve Outcome: Progressing Goal: Individualized Educational Video(s) Outcome: Progressing   Problem: Coping: Goal: Ability to adjust to condition or change in health will improve Outcome: Progressing   

## 2022-09-30 NOTE — Progress Notes (Signed)
Orthopedic Tech Progress Note Patient Details:  Jocelyn Kaufman 1956-01-10 [id number]  Ortho Devices Ortho Device/Splint Location: Cast shoe Ortho Device/Splint Interventions: Ordered   Post Interventions Patient Tolerated: Other (comment) Instructions Provided: Adjustment of device, Care of device, Poper ambulation with device  [name] 09/30/2022, 2:06 PM

## 2022-09-30 NOTE — Assessment & Plan Note (Signed)
-  Admitted for a clot in October -Has been on Lovenox post-operatively -She should be appropriate to transition back to Eliquis as this time

## 2022-09-30 NOTE — Assessment & Plan Note (Signed)
-  Appears to be stable at this time -Attempt to avoid nephrotoxic medications

## 2022-09-30 NOTE — Progress Notes (Signed)
ANTICOAGULATION CONSULT NOTE - Initial Consult  Pharmacy Consult for apixaban Indication: pulmonary embolus  Allergies  Allergen Reactions   Semaglutide(0.25 Or 0.11m-Dos) Other (See Comments)    Causes cataracts requiring surgery and impairing vision    Shellfish-Derived Products Anaphylaxis, Swelling and Other (See Comments)    Patient avoids   Empagliflozin Itching, Swelling and Other (See Comments)    UTI sx's and yeast infections   Rho (D) Immune Globulin      Not available   Tramadol Hcl Other (See Comments)    hallucinations   Canagliflozin Other (See Comments)    Urinary incontinence    Latex Rash   Liraglutide Itching    Other reaction(s): itching all over   Minoxidil Rash    skin irritation   Omeprazole Rash   Percocet [Oxycodone-Acetaminophen] Nausea And Vomiting    Patient Measurements: Height: 5' 4.5" (163.8 cm) Weight: 108.9 kg (240 lb) IBW/kg (Calculated) : 55.85  Vital Signs: Temp: 99.6 F (37.6 C) (12/03 1719) Temp Source: Oral (12/03 1719) BP: 169/74 (12/03 1719) Pulse Rate: 88 (12/03 1719)  Labs: Recent Labs    09/28/22 0749 09/30/22 0826  HGB 7.9* 7.3*  HCT 25.5* 23.3*  PLT 148* 181  CREATININE  --  1.10*    Estimated Creatinine Clearance: 61.2 mL/min (A) (by C-G formula based on SCr of 1.1 mg/dL (H)).   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   09/25/2022   Aflibercept (EYLEA) 2 MG/0.05ML SOLN 2 mg by Intravitreal route every 3 (three) months.   Past Month   albuterol (VENTOLIN HFA) 108 (90 Base) MCG/ACT inhaler Inhale two puffs every four to six hours as needed for cough or wheeze. (Patient taking differently: Inhale 1-2 puffs into the lungs every 6 (six) hours as needed for wheezing.) 18 g 2 Past Month   amLODipine (NORVASC) 10 MG tablet Take 1 tablet (10 mg total) by mouth daily.   09/25/2022   atorvastatin (LIPITOR) 40 MG tablet Take 1 tablet (40 mg total) by mouth daily at 6 PM. (Patient taking differently: Take 40 mg by mouth daily.) 30 tablet 0    CHELATED IRON PO Take 18 mg by mouth daily.   Past Week   Cholecalciferol (VITAMIN D3) 125 MCG (5000 UT) CAPS Take 5,000 Units by mouth daily.   Past Week   ciclopirox (PENLAC) 8 % solution Apply topically at bedtime. Apply over nail and surrounding skin. Apply daily over previous coat. After seven (7) days, may remove with alcohol and continue cycle. 6.6 mL 0    Dulaglutide (TRULICITY) 1.5 MBD/5.3GDSOPN Inject 1.5 mg into the skin  every Tuesday.   09/18/2022   enoxaparin (LOVENOX) 150 MG/ML injection Inject 1 mL (150 mg total) into the skin every 12 (twelve) hours. Self administer twice a day for 2 days starting 09/24/22. 4 mL 0 09/25/2022 at 1900   EPINEPHrine (EPIPEN 2-PAK) 0.3 mg/0.3 mL IJ SOAJ injection Use as directed for life-threatening allergic reaction. 2 each 1    epoetin alfa-epbx (RETACRIT) 2000 UNIT/ML injection See admin instructions.      escitalopram (LEXAPRO) 20 MG tablet Take 20 mg by mouth daily.   09/26/2022 at 0800   ezetimibe (ZETIA) 10 MG tablet Take 1 tablet (10 mg total) by mouth daily.   09/26/2022 at 0800   fexofenadine (ALLEGRA ALLERGY) 180 MG tablet Take 1 tablet (180 mg total) by mouth daily.   16/07/9603 at 5409   folic  acid (FOLVITE) 1 MG tablet Take 2 mg by mouth daily.   Past Week   hydrALAZINE (APRESOLINE) 50 MG tablet Take 50 mg by mouth 3 (three) times daily.   09/26/2022 at 0800   HYDROcodone-acetaminophen (NORCO) 5-325 MG tablet Take 1 tablet by mouth every 6 (six) hours as needed for severe pain. 15 tablet 0 Past Week   hydrocortisone cream 1 % Apply 1 Application topically 2 (two) times daily as needed for itching.   Past Week   insulin aspart (NOVOLOG) 100 UNIT/ML FlexPen Inject 20 Units into the skin See admin instructions. Inject 20 units into the skin two to three times a day before meals   09/25/2022   insulin glargine, 2 Unit Dial, (TOUJEO MAX SOLOSTAR) 300 UNIT/ML Solostar Pen Inject 46 Units into the skin daily. (Patient taking differently: Inject 42 Units into the skin at bedtime.)   09/25/2022   ipratropium (ATROVENT) 0.06 % nasal spray Place 1-2 sprays each nostril as needed up to 3 times a day (Patient taking differently: Place 1-2 sprays into both nostrils 3 (three) times daily as needed for rhinitis.) 15 mL 5 not taking   irbesartan (AVAPRO) 300 MG tablet Take 1 tablet (300 mg total) by mouth daily.   09/25/2022   latanoprost (XALATAN) 0.005 % ophthalmic solution Place 1 drop into both eyes at bedtime.   Past Week   lidocaine (LMX) 4 % cream Apply 1 Application topically as needed (pain).   Past Week   Methotrexate Sodium (METHOTREXATE, PF,) 50 MG/2ML injection Inject 20 mg into the muscle every Wednesday. 0.8 ml   09/18/2022   metoprolol succinate (TOPROL-XL) 25 MG 24 hr tablet Take 1 tablet (25 mg total) by mouth daily. May take additional half tablet as needed once daily for palpitations. (Patient taking differently: Take 12.5-25 mg by mouth See admin instructions. Take 25 mg by mouth once a day and an additional 12.5 mg (half tablet) as needed once daily for palpitations) 135 tablet 3 09/26/2022 at 0800   Multiple Vitamins-Minerals (CENTRUM SILVER PO) Take 1 tablet by mouth daily with  breakfast.   Past Week   predniSONE (DELTASONE) 10 MG tablet Take 10 mg by mouth daily.   09/25/2022   sulfaSALAzine (AZULFIDINE) 500 MG tablet Take 3 tablets (1,500 mg total) by mouth 2 (two) times daily.   Past Week   SYNTHROID 125 MCG tablet Take 125 mcg by mouth every Monday, Tuesday, Wednesday, Thursday, and Friday.   09/26/2022 at 0800   triamcinolone cream (KENALOG) 0.1 % Apply 1 Application topically 3 (three) times daily as needed (for itching- affected areas).      vitamin B-12 (CYANOCOBALAMIN) 1000 MCG tablet Take  1,000 mcg by mouth daily.   Past Week   [DISCONTINUED] APIXABAN (ELIQUIS) VTE STARTER PACK (10MG AND 5MG) Take as directed on package: start with two-66m tablets twice daily for 7 days. On day 8, switch to one-564mtablet twice daily. 74 tablet 0 09/23/2022   Accu-Chek Softclix Lancets lancets use to check blood sugar      B-D ULTRAFINE III SHORT PEN 31G X 8 MM MISC SMARTSIG:1 Each SUB-Q Daily      Lancets Misc. (ACCU-CHEK SOFTCLIX LANCET DEV) KIT See admin instructions.      metaxalone (SKELAXIN) 800 MG tablet Take 800 mg by mouth 3 (three) times daily as needed for muscle spasms.   More than a month    Assessment: Patient with recent PE found 10/23 and was placed on Eliquis. She was transitioned to Lovenox in preparation for procedure 11/27. She is s/p Revision quadriceps repair with allograft 11/29 and Lovenox was restarted on 11/30. Last dose of Lovenox on 12/3 @ 0816.  Pharmacy now consulted to transition back to Eliquis (Last dose PTA 11/26). Of note: some mixed documentation throughout chart. To be clear patient has been on Eliquis (apixaban), NOT Xarelto (rivaroxaban); for pulmonary embolism. Patient does have a diagnosis of Afib, but she was not on anticoagulation prior to pulmonary embolism that I can find.  Goal of Therapy:  Therapeutic anticoagulation Monitor platelets by anticoagulation protocol: Yes   Plan:  Discontinue Lovenox Start apixaban 5 mg PO BID (~12  hours after last LMWH dose) Monitor for signs and symptoms of bleeding    Thank you for allowing [place] participate in this patients care. ToJens SomPharmD 09/30/2022 7:24 PM  **Pharmacist phone directory can be found on [facility] [place] listed under [place]

## 2022-10-01 ENCOUNTER — Other Ambulatory Visit: Payer: Self-pay | Admitting: Hematology and Oncology

## 2022-10-01 DIAGNOSIS — S76111S Strain of right quadriceps muscle, fascia and tendon, sequela: Secondary | ICD-10-CM | POA: Diagnosis not present

## 2022-10-01 DIAGNOSIS — D500 Iron deficiency anemia secondary to blood loss (chronic): Secondary | ICD-10-CM | POA: Diagnosis not present

## 2022-10-01 DIAGNOSIS — I10 Essential (primary) hypertension: Secondary | ICD-10-CM | POA: Diagnosis not present

## 2022-10-01 LAB — URINALYSIS, ROUTINE W REFLEX MICROSCOPIC
Bilirubin Urine: NEGATIVE
Glucose, UA: NEGATIVE mg/dL
Ketones, ur: NEGATIVE mg/dL
Leukocytes,Ua: NEGATIVE
Nitrite: NEGATIVE
Protein, ur: >=300 mg/dL — AB
Specific Gravity, Urine: 1.015 (ref 1.005–1.030)
pH: 6 (ref 5.0–8.0)

## 2022-10-01 LAB — BASIC METABOLIC PANEL
Anion gap: 9 (ref 5–15)
BUN: 15 mg/dL (ref 8–23)
CO2: 25 mmol/L (ref 22–32)
Calcium: 8.9 mg/dL (ref 8.9–10.3)
Chloride: 102 mmol/L (ref 98–111)
Creatinine, Ser: 1.08 mg/dL — ABNORMAL HIGH (ref 0.44–1.00)
GFR, Estimated: 57 mL/min — ABNORMAL LOW (ref >60)
Glucose, Bld: 123 mg/dL — ABNORMAL HIGH (ref 70–99)
Potassium: 5.1 mmol/L (ref 3.5–5.1)
Sodium: 136 mmol/L (ref 135–145)

## 2022-10-01 LAB — CBC
HCT: 22.5 % — ABNORMAL LOW (ref 36.0–46.0)
Hemoglobin: 7.1 g/dL — ABNORMAL LOW (ref 12.0–15.0)
MCH: 28.9 pg (ref 26.0–34.0)
MCHC: 31.6 g/dL (ref 30.0–36.0)
MCV: 91.5 fL (ref 80.0–100.0)
Platelets: 187 10*3/uL (ref 150–400)
RBC: 2.46 MIL/uL — ABNORMAL LOW (ref 3.87–5.11)
RDW: 15.9 % — ABNORMAL HIGH (ref 11.5–15.5)
WBC: 13.7 10*3/uL — ABNORMAL HIGH (ref 4.0–10.5)
nRBC: 0.1 % (ref 0.0–0.2)

## 2022-10-01 LAB — GLUCOSE, CAPILLARY
Glucose-Capillary: 149 mg/dL — ABNORMAL HIGH (ref 70–99)
Glucose-Capillary: 121 mg/dL — ABNORMAL HIGH (ref 70–99)
Glucose-Capillary: 136 mg/dL — ABNORMAL HIGH (ref 70–99)
Glucose-Capillary: 144 mg/dL — ABNORMAL HIGH (ref 70–99)

## 2022-10-01 LAB — PREPARE RBC (CROSSMATCH)

## 2022-10-01 LAB — ABO/RH: ABO/RH(D): A POS

## 2022-10-01 MED ORDER — HYDRALAZINE HCL 25 MG PO TABS
25.0000 mg | ORAL_TABLET | Freq: Once | ORAL | Status: AC
  Administered 2022-10-01: 25 mg via ORAL
  Filled 2022-10-01: qty 1

## 2022-10-01 MED ORDER — EPOETIN ALFA 4000 UNIT/ML IJ SOLN
4000.0000 [IU] | Freq: Once | INTRAMUSCULAR | Status: AC
  Administered 2022-10-01: 4000 [IU] via SUBCUTANEOUS
  Filled 2022-10-01: qty 1

## 2022-10-01 MED ORDER — HYDROMORPHONE HCL 1 MG/ML IJ SOLN
0.5000 mg | INTRAMUSCULAR | Status: DC | PRN
  Administered 2022-10-01 – 2022-10-02 (×2): 0.5 mg via INTRAVENOUS
  Filled 2022-10-01 (×2): qty 0.5

## 2022-10-01 MED ORDER — SODIUM CHLORIDE 0.9% IV SOLUTION: Freq: Once | INTRAVENOUS | Status: AC

## 2022-10-01 MED ORDER — HYDRALAZINE HCL 50 MG PO TABS
75.0000 mg | ORAL_TABLET | Freq: Three times a day (TID) | ORAL | Status: DC
  Administered 2022-10-01 – 2022-10-02 (×3): 75 mg via ORAL
  Filled 2022-10-01 (×3): qty 1

## 2022-10-01 MED ORDER — FERROUS SULFATE 325 (65 FE) MG PO TABS
325.0000 mg | ORAL_TABLET | Freq: Every day | ORAL | Status: DC
  Administered 2022-10-01 – 2022-10-04 (×4): 325 mg via ORAL
  Filled 2022-10-01 (×4): qty 1

## 2022-10-01 MED ORDER — GABAPENTIN 100 MG PO CAPS
100.0000 mg | ORAL_CAPSULE | Freq: Three times a day (TID) | ORAL | Status: DC
  Administered 2022-10-01 (×3): 100 mg via ORAL
  Filled 2022-10-01 (×3): qty 1

## 2022-10-01 MED ORDER — EPOETIN ALFA-EPBX 4000 UNIT/ML IJ SOLN
4000.0000 [IU] | Freq: Once | INTRAMUSCULAR | Status: DC
  Filled 2022-10-01: qty 1

## 2022-10-01 MED ORDER — SODIUM ZIRCONIUM CYCLOSILICATE 5 G PO PACK
5.0000 g | PACK | Freq: Once | ORAL | Status: AC
  Administered 2022-10-01: 5 g via ORAL
  Filled 2022-10-01: qty 1

## 2022-10-01 NOTE — Progress Notes (Signed)
Mobility Specialist Progress Note   10/01/22 1544  Mobility  Activity Contraindicated/medical hold   Patient not appropriate at this time given level of complexity, physical assist, and/or precautions as advised by PT/OT. Patient doesn't maintain NWB precaution. MS to hold today and continue to follow for readiness.  [place] Zea Kostka, BS EXP Mobility Specialist Please contact via SecureChat or Rehab office at [phone]

## 2022-10-01 NOTE — Progress Notes (Signed)
   ORTHOPAEDIC PROGRESS NOTE  s/p Procedure(s): REVISION REPAIR QUADRICEP TENDON LIGAMENT REPAIR/knee on 09/26/2022 with Dr. Griffin Basil  SUBJECTIVE: Continues to have pain in the right leg. She has still required Dilaudid. We discussed weaning off of Dilaudid as she cannot get this at SNF. She is frustrated because she still does not know a plan. She has been unable to avoid putting weight on her operative leg. She states that physical therapy told her she would only see them every 3 days because of the orders. I will discuss with her nurse to make sure PT and/or OT sees her today. She states she was told her splint would get re-wrapped today. I told her she will maintain her current splint until she is seen in the office. With her low Hgb, I will discuss blood transfusion with hospitalist.   No chest pain. No SOB. No nausea/vomiting. No other complaints.  OBJECTIVE: PE: General: resting in hospital bed, NAD RLE: splint CDI, +EHL though remainder of motor difficult to test due to splint, sensation intact distally with warm well perfused foot, no pain w passive stretch, warm well perfused extremity  Vitals:   09/30/22 1719 09/30/22 2100  BP: (!) 169/74 (!) 152/78  Pulse: 88 89  Resp:    Temp: 99.6 F (37.6 C) 98.3 F (36.8 C)  SpO2: 97% 98%     ASSESSMENT: Jocelyn Kaufman is a 66 y.o. female  POD#5  PLAN: Weightbearing: TDWB in splint Insicional and dressing care: Dressings left intact until follow-up. A cast will be placed at her first post-op visit in the office Orthopedic device(s): Splint Showering: Hold for now VTE prophylaxis: Eliquis Pain control: PRN pain medications, minimize narcotics as able. Wean off Dilaudid. Added Gabapentin to pain regimen.  ABLA: Hgb 7.1 this AM. Discussed with hospitalist team. She will receive pRBCs.  Follow - up plan: 2 weeks in office Dispo: SNF. PT/OT recommending SNF. TOC following. Waiting bed availability. Appreciate hospitalist team for  helping manage patient's co-morbidities. Due to low Hgb and need for transfusion unlikely will be able to discharge today. Dispo depends on medical clearance.    Contact information:  Weekdays 8-5 Dr. Ophelia Charter, Noemi Chapel PA-C, After hours and holidays please check [email or web address] for group call information for [facility]   Noemi Chapel, PA-C 10/01/2022

## 2022-10-01 NOTE — Progress Notes (Signed)
PROGRESS NOTE    Jocelyn Kaufman  NFA:[id number] DOB: [birth year] DOA: 09/26/2022 PCP: Deland Pretty, MD   Brief Narrative: 66 year old with past medical history significant for depression, diabetes, hypertension, hyperlipidemia, rheumatoid arthritis, A-fib on Eliquis, hypothyroidism who presented on 11/28 with right quad tendon rupture with instability, underwent revision quadriceps repair with allograft as well as medial and lateral retinacular repair.  She has been noted to have systolic blood pressure [phone].  We were consulted on 12/3 for management of blood pressure and chronic medical problems.     Assessment & Plan:   Principal Problem:   Quadriceps muscle rupture, right, sequela Active Problems:   Diabetes mellitus (HCC)   Essential hypertension   Mixed hyperlipidemia   Rheumatoid arthritis (HCC)   Chronic kidney disease, stage 3a (HCC)   Hypothyroid   Morbid obesity (HCC)   Pulmonary embolus (HCC)   ABLA (acute blood loss anemia)   1-Quadricept  muscle rupture,Right  -Status postrepair -Pain management per Ortho  2-Acute blood loss anemia Anemia of chronic disease Hemoglobin baseline 9-10 She follows with Dr. Payton Mccallum for Retacrit.  Hb today at 7. Plan to proceed with One unit PRBC>  Resume iron.  Discussed with Dr Lindi Adie, ok to give a dose of Retacrit.   3-HTN;  Plan to increase hydralazine to 75 TID  Continue with Avapro, monitor potassium level Continue with metoprolol, Norvasc  4-Pulmonary embolism: Continue with Eliquis  Morbid obesity: Lifestyle modifications  Hypothyroidism: Continue with Synthroid  CKD stage IIIa: Continue to monitor  RA;: Continue with chronic prednisone and methotrexate  Hyperlipidemia: Continue with atorvastatin and Zetia  Diabetes type 2: Continue with Gargline  and sliding scale insulin  Mild Leukocytosis; UA negative  Estimated body mass index is 40.56 kg/m as calculated from the following:   Height as of this  encounter: 5' 4.5" (1.638 m).   Weight as of this encounter: 108.9 kg.   DVT prophylaxis: Eliquis Code Status: Full code Family Communication: care discussed with patient Disposition Plan:  Status is: Observation The patient remains OBS appropriate and will d/c before 2 midnights.      Subjective: She needs something for pain. She denies dysuria.  She follows with Dr Lindi Adie for retacrit.  She agrees to have blood transfusion.   Objective: Vitals:   09/30/22 1052 09/30/22 1239 09/30/22 1719 09/30/22 2100  BP: (!) 186/71 (!) 187/74 (!) 169/74 (!) 152/78  Pulse:  80 88 89  Resp:  17    Temp:  99.9 F (37.7 C) 99.6 F (37.6 C) 98.3 F (36.8 C)  TempSrc:  Oral Oral Oral  SpO2:  97% 97% 98%  Weight:      Height:        Intake/Output Summary (Last 24 hours) at 10/01/2022 0739 Last data filed at 10/01/2022 0409 Gross per 24 hour  Intake 759.31 ml  Output 600 ml  Net 159.31 ml   Filed Weights   09/26/22 1303  Weight: 108.9 kg    Examination:  General exam: Appears calm and comfortable  Respiratory system: Clear to auscultation. Respiratory effort normal. Cardiovascular system: S1 & S2 heard, RRR. No JVD, murmurs, rubs, gallops or clicks. No pedal edema. Gastrointestinal system: Abdomen is nondistended, soft and nontender. No organomegaly or masses felt. Normal bowel sounds heard. Central nervous system: Alert and oriented. No focal neurological deficits. Extremities: Right LE with dressing.   Data Reviewed: I have personally reviewed following labs and imaging studies  CBC: Recent Labs  Lab 09/26/22 1914 09/27/22 0620 09/28/22  1314 09/30/22 0826 10/01/22 0353  WBC 7.4 8.3 7.2 11.5* 13.7*  HGB 10.1* 9.1* 7.9* 7.3* 7.1*  HCT 33.6* 30.0* 25.5* 23.3* 22.5*  MCV 93.1 94.6 93.4 92.1 91.5  PLT 179 165 148* 181 388   Basic Metabolic Panel: Recent Labs  Lab 09/26/22 1914 09/30/22 0826  CREATININE 1.06* 1.10*   GFR: Estimated Creatinine Clearance: 61.2  mL/min (A) (by C-G formula based on SCr of 1.1 mg/dL (H)). Liver Function Tests: No results for input(s): "AST", "ALT", "ALKPHOS", "BILITOT", "PROT", "ALBUMIN" in the last 168 hours. No results for input(s): "LIPASE", "AMYLASE" in the last 168 hours. No results for input(s): "AMMONIA" in the last 168 hours. Coagulation Profile: No results for input(s): "INR", "PROTIME" in the last 168 hours. Cardiac Enzymes: No results for input(s): "CKTOTAL", "CKMB", "CKMBINDEX", "TROPONINI" in the last 168 hours. BNP (last 3 results) No results for input(s): "PROBNP" in the last 8760 hours. HbA1C: No results for input(s): "HGBA1C" in the last 72 hours. CBG: Recent Labs  Lab 09/29/22 1931 09/30/22 0746 09/30/22 1149 09/30/22 1633 09/30/22 1949  GLUCAP 212* 103* 139* 172* 158*   Lipid Profile: No results for input(s): "CHOL", "HDL", "LDLCALC", "TRIG", "CHOLHDL", "LDLDIRECT" in the last 72 hours. Thyroid Function Tests: No results for input(s): "TSH", "T4TOTAL", "FREET4", "T3FREE", "THYROIDAB" in the last 72 hours. Anemia Panel: No results for input(s): "VITAMINB12", "FOLATE", "FERRITIN", "TIBC", "IRON", "RETICCTPCT" in the last 72 hours. Sepsis Labs: No results for input(s): "PROCALCITON", "LATICACIDVEN" in the last 168 hours.  No results found for this or any previous visit (from the past 240 hour(s)).       Radiology Studies: No results found.      Scheduled Meds:  acetaminophen  500 mg Oral Q8H   amLODipine  10 mg Oral Daily   apixaban  5 mg Oral BID   atorvastatin  40 mg Oral Daily   cholecalciferol  5,000 Units Oral Daily   cyanocobalamin  1,000 mcg Oral Daily   docusate sodium  100 mg Oral BID   escitalopram  20 mg Oral Daily   ezetimibe  10 mg Oral Daily   ferrous sulfate  325 mg Oral Q breakfast   folic acid  2 mg Oral Daily   hydrALAZINE  50 mg Oral TID   insulin aspart  0-15 Units Subcutaneous TID WC   insulin aspart  4 Units Subcutaneous TID WC   insulin  glargine-yfgn  42 Units Subcutaneous QHS   irbesartan  300 mg Oral Daily   latanoprost  1 drop Both Eyes QHS   levothyroxine  125 mcg Oral Once per day on Mon Tue Wed Thu Fri   loratadine  10 mg Oral Daily   methocarbamol  750 mg Oral QID   metoprolol succinate  25 mg Oral Daily   predniSONE  5 mg Oral Daily   sulfaSALAzine  1,500 mg Oral BID   Continuous Infusions:   LOS: 0 days    Time spent: 35 minutes.     Elmarie Shiley, MD [facility]   If 7PM-7AM, please contact night-coverage [email or web address]  10/01/2022, 7:39 AM

## 2022-10-02 ENCOUNTER — Observation Stay (HOSPITAL_COMMUNITY): Payer: Medicare PPO

## 2022-10-02 DIAGNOSIS — I10 Essential (primary) hypertension: Secondary | ICD-10-CM | POA: Diagnosis not present

## 2022-10-02 DIAGNOSIS — S76111S Strain of right quadriceps muscle, fascia and tendon, sequela: Secondary | ICD-10-CM | POA: Diagnosis present

## 2022-10-02 DIAGNOSIS — E782 Mixed hyperlipidemia: Secondary | ICD-10-CM | POA: Diagnosis present

## 2022-10-02 DIAGNOSIS — D500 Iron deficiency anemia secondary to blood loss (chronic): Secondary | ICD-10-CM

## 2022-10-02 DIAGNOSIS — W19XXXS Unspecified fall, sequela: Secondary | ICD-10-CM | POA: Diagnosis present

## 2022-10-02 DIAGNOSIS — N179 Acute kidney failure, unspecified: Secondary | ICD-10-CM | POA: Diagnosis not present

## 2022-10-02 DIAGNOSIS — F32A Depression, unspecified: Secondary | ICD-10-CM | POA: Diagnosis present

## 2022-10-02 DIAGNOSIS — J45909 Unspecified asthma, uncomplicated: Secondary | ICD-10-CM | POA: Diagnosis present

## 2022-10-02 DIAGNOSIS — N1831 Chronic kidney disease, stage 3a: Secondary | ICD-10-CM | POA: Diagnosis present

## 2022-10-02 DIAGNOSIS — E1122 Type 2 diabetes mellitus with diabetic chronic kidney disease: Secondary | ICD-10-CM | POA: Diagnosis present

## 2022-10-02 DIAGNOSIS — Z794 Long term (current) use of insulin: Secondary | ICD-10-CM | POA: Diagnosis not present

## 2022-10-02 DIAGNOSIS — E249 Cushing's syndrome, unspecified: Secondary | ICD-10-CM | POA: Diagnosis present

## 2022-10-02 DIAGNOSIS — D72829 Elevated white blood cell count, unspecified: Secondary | ICD-10-CM | POA: Diagnosis not present

## 2022-10-02 DIAGNOSIS — D631 Anemia in chronic kidney disease: Secondary | ICD-10-CM | POA: Diagnosis present

## 2022-10-02 DIAGNOSIS — E875 Hyperkalemia: Secondary | ICD-10-CM | POA: Diagnosis present

## 2022-10-02 DIAGNOSIS — M069 Rheumatoid arthritis, unspecified: Secondary | ICD-10-CM | POA: Diagnosis present

## 2022-10-02 DIAGNOSIS — Z6841 Body Mass Index (BMI) 40.0 and over, adult: Secondary | ICD-10-CM | POA: Diagnosis not present

## 2022-10-02 DIAGNOSIS — I4891 Unspecified atrial fibrillation: Secondary | ICD-10-CM | POA: Diagnosis present

## 2022-10-02 DIAGNOSIS — I129 Hypertensive chronic kidney disease with stage 1 through stage 4 chronic kidney disease, or unspecified chronic kidney disease: Secondary | ICD-10-CM | POA: Diagnosis present

## 2022-10-02 DIAGNOSIS — E039 Hypothyroidism, unspecified: Secondary | ICD-10-CM | POA: Diagnosis present

## 2022-10-02 DIAGNOSIS — D62 Acute posthemorrhagic anemia: Secondary | ICD-10-CM | POA: Diagnosis not present

## 2022-10-02 LAB — BASIC METABOLIC PANEL
Anion gap: 6 (ref 5–15)
BUN: 17 mg/dL (ref 8–23)
CO2: 25 mmol/L (ref 22–32)
Calcium: 8.5 mg/dL — ABNORMAL LOW (ref 8.9–10.3)
Chloride: 100 mmol/L (ref 98–111)
Creatinine, Ser: 1.05 mg/dL — ABNORMAL HIGH (ref 0.44–1.00)
GFR, Estimated: 59 mL/min — ABNORMAL LOW (ref >60)
Glucose, Bld: 103 mg/dL — ABNORMAL HIGH (ref 70–99)
Potassium: 5 mmol/L (ref 3.5–5.1)
Sodium: 131 mmol/L — ABNORMAL LOW (ref 135–145)

## 2022-10-02 LAB — CBC
HCT: 22.2 % — ABNORMAL LOW (ref 36.0–46.0)
Hemoglobin: 7.3 g/dL — ABNORMAL LOW (ref 12.0–15.0)
MCH: 28.9 pg (ref 26.0–34.0)
MCHC: 32.9 g/dL (ref 30.0–36.0)
MCV: 87.7 fL (ref 80.0–100.0)
Platelets: 162 10*3/uL (ref 150–400)
RBC: 2.53 MIL/uL — ABNORMAL LOW (ref 3.87–5.11)
RDW: 16.9 % — ABNORMAL HIGH (ref 11.5–15.5)
WBC: 17.8 10*3/uL — ABNORMAL HIGH (ref 4.0–10.5)
nRBC: 0.6 % — ABNORMAL HIGH (ref 0.0–0.2)

## 2022-10-02 LAB — PREPARE RBC (CROSSMATCH)

## 2022-10-02 LAB — HEMOGLOBIN AND HEMATOCRIT, BLOOD
HCT: 22.6 % — ABNORMAL LOW (ref 36.0–46.0)
Hemoglobin: 7.8 g/dL — ABNORMAL LOW (ref 12.0–15.0)

## 2022-10-02 LAB — GLUCOSE, CAPILLARY
Glucose-Capillary: 99 mg/dL (ref 70–99)
Glucose-Capillary: 118 mg/dL — ABNORMAL HIGH (ref 70–99)
Glucose-Capillary: 195 mg/dL — ABNORMAL HIGH (ref 70–99)
Glucose-Capillary: 149 mg/dL — ABNORMAL HIGH (ref 70–99)

## 2022-10-02 MED ORDER — ORAL CARE MOUTH RINSE: 15.0000 mL | OROMUCOSAL | Status: DC | PRN

## 2022-10-02 MED ORDER — HYDRALAZINE HCL 50 MG PO TABS
100.0000 mg | ORAL_TABLET | Freq: Three times a day (TID) | ORAL | Status: DC
  Administered 2022-10-02 – 2022-10-04 (×6): 100 mg via ORAL
  Filled 2022-10-02 (×7): qty 2

## 2022-10-02 MED ORDER — ACETAMINOPHEN 500 MG PO TABS
1000.0000 mg | ORAL_TABLET | Freq: Three times a day (TID) | ORAL | Status: DC
  Administered 2022-10-02 (×2): 1000 mg via ORAL
  Administered 2022-10-03: 500 mg via ORAL
  Administered 2022-10-03 – 2022-10-04 (×4): 1000 mg via ORAL
  Filled 2022-10-02 (×7): qty 2

## 2022-10-02 MED ORDER — ACETAMINOPHEN 325 MG PO TABS: 325.0000 mg | ORAL_TABLET | Freq: Four times a day (QID) | ORAL | Status: DC | PRN

## 2022-10-02 MED ORDER — OXYCODONE HCL 5 MG PO TABS: 5.0000 mg | ORAL_TABLET | Freq: Four times a day (QID) | ORAL | Status: DC | PRN

## 2022-10-02 MED ORDER — CEFAZOLIN SODIUM-DEXTROSE 2-4 GM/100ML-% IV SOLN
2.0000 g | Freq: Three times a day (TID) | INTRAVENOUS | Status: DC
  Administered 2022-10-02 – 2022-10-03 (×3): 2 g via INTRAVENOUS
  Filled 2022-10-02 (×3): qty 100

## 2022-10-02 MED ORDER — OXYCODONE HCL 5 MG PO TABS
10.0000 mg | ORAL_TABLET | Freq: Four times a day (QID) | ORAL | Status: DC | PRN
  Administered 2022-10-02 – 2022-10-03 (×3): 10 mg via ORAL
  Filled 2022-10-02 (×3): qty 2

## 2022-10-02 MED ORDER — SODIUM CHLORIDE 0.9% IV SOLUTION: Freq: Once | INTRAVENOUS | Status: DC

## 2022-10-02 MED ORDER — ACETAMINOPHEN 325 MG PO TABS
650.0000 mg | ORAL_TABLET | Freq: Four times a day (QID) | ORAL | Status: DC | PRN
  Administered 2022-10-02: 650 mg via ORAL
  Filled 2022-10-02: qty 2

## 2022-10-02 MED ORDER — GABAPENTIN 300 MG PO CAPS
300.0000 mg | ORAL_CAPSULE | Freq: Every day | ORAL | Status: DC
  Administered 2022-10-02 – 2022-10-03 (×2): 300 mg via ORAL
  Filled 2022-10-02 (×2): qty 1

## 2022-10-02 MED ORDER — HYDRALAZINE HCL 25 MG PO TABS
25.0000 mg | ORAL_TABLET | Freq: Once | ORAL | Status: AC
  Administered 2022-10-02: 25 mg via ORAL
  Filled 2022-10-02: qty 1

## 2022-10-02 MED ORDER — GABAPENTIN 100 MG PO CAPS
100.0000 mg | ORAL_CAPSULE | Freq: Two times a day (BID) | ORAL | Status: DC
  Administered 2022-10-02 – 2022-10-04 (×6): 100 mg via ORAL
  Filled 2022-10-02 (×6): qty 1

## 2022-10-02 NOTE — Progress Notes (Signed)
   ORTHOPAEDIC PROGRESS NOTE  s/p Procedure(s): REVISION REPAIR QUADRICEP TENDON LIGAMENT REPAIR/knee on 09/26/2022 with Dr. Griffin Basil  SUBJECTIVE: Patient did not have therapy yesterday. She is still having pain. Hgb 7.3 this morning. WBC trending up. She had a bowel movement today. No pain/burning with urinating. She still has no appetite. Her pain varies. She required Dilaudid last night. We discussed her pain management at length. We will discontinue Dilaudid. Switch to oxycodone. Scheduled tylenol, gabapentin, and robaxin. Due to her low hgb and wbc trending up is likely to stay in the hospital. Pain has not increased.   No chest pain. No SOB. No nausea/vomiting. No other complaints.  OBJECTIVE: PE: General: resting in hospital bed, NAD RLE: splint CDI, +EHL though remainder of motor difficult to test due to splint, sensation intact distally with warm well perfused foot, no pain w passive stretch, warm well perfused extremity, swelling of right knee as to be expected, no signs of infection, no purulent drainage, no increased tenderness to palpation   Vitals:   10/02/22 0513 10/02/22 0740  BP: (!) 163/73 (!) 179/59  Pulse: 82 83  Resp: 16   Temp: 99.3 F (37.4 C) 99.9 F (37.7 C)  SpO2: 98% 98%     ASSESSMENT: Jocelyn Kaufman is a 66 y.o. female  POD#6  PLAN: Weightbearing: TDWB in splint Insicional and dressing care: Dressings left intact until follow-up. A cast will be placed at her first post-op visit in the office. Redressed her incision today.  Orthopedic device(s): Splint Showering: Hold for now VTE prophylaxis: Eliquis Pain control: PRN pain medications, minimize narcotics as able. Discontinued Dilaudid.  Tylenol 1000 mg every 8 hours Robaxin 750 mg every 8 hours Gabapentin 100 mg qAM, 100 mg midday, 300 mg qPM Oxycodone 5-10 mg every 6 hours only as needed for SEVERE PAIN  - we discussed weaning off oxycodone as well 5. [employer] - order was placed to have this  sent to the patient from the OR/PACU  Acute on chronic anemia: Hgb 7.3 this AM. Discussed with hospitalist team.  Leukocytosis: WBC trending up.  - There are no signs of infection at patient's surgical site. Incision is healing well. New dressing placed today.  - Appreciate hospitalist team help in managing this patient.  Follow - up plan: 2 weeks in office Dispo: SNF. PT/OT recommending SNF. TOC following. Appreciate hospitalist team for helping manage patient's co-morbidities. Due to low Hgb and need for transfusion unlikely will be able to discharge today. WBC continues to trend upwards. Surgical site is healing well with no signs of infection. Dispo depends on medical clearance.    Contact information:  Weekdays 8-5 Dr. Ophelia Charter, Noemi Chapel PA-C, After hours and holidays please check [email or web address] for group call information for [facility]   Noemi Chapel, PA-C 10/02/2022

## 2022-10-02 NOTE — Progress Notes (Addendum)
PROGRESS NOTE    Jocelyn Kaufman  UMP:[id number] DOB: [birth year] DOA: 09/26/2022 PCP: Deland Pretty, MD   Brief Narrative: 66 year old with past medical history significant for depression, diabetes, hypertension, hyperlipidemia, rheumatoid arthritis, A-fib on Eliquis, hypothyroidism who presented on 11/28 with right quad tendon rupture with instability, underwent revision quadriceps repair with allograft as well as medial and lateral retinacular repair.  She has been noted to have systolic blood pressure [phone].  We were consulted on 12/3 for management of blood pressure and chronic medical problems.     Assessment & Plan:   Principal Problem:   Quadriceps muscle rupture, right, sequela Active Problems:   Diabetes mellitus (HCC)   Essential hypertension   Mixed hyperlipidemia   Rheumatoid arthritis (HCC)   Chronic kidney disease, stage 3a (HCC)   Hypothyroid   Morbid obesity (HCC)   Pulmonary embolus (HCC)   ABLA (acute blood loss anemia)   1-Quadricept  muscle rupture,Right  -Status postrepair -Pain management per Ortho  2-Acute blood loss anemia Anemia of chronic disease Hemoglobin baseline 9-10 She follows with Dr. Sonny Dandy for Retacrit.  Received  One unit PRBC> 12/04--hb remain same 7.3 Plan to transfuse another unit PRBC. 12/05 Continue with  iron.  Patient received a dose of Retacrit 1/04.   3-HTN;  Plan to increase hydralazine to 100 TID today.  Continue with Avapro, monitor potassium level Continue with metoprolol, Norvasc  4-History of Pulmonary embolism: Continue with Eliquis  Leukocytosis;  Chest x ray negative for PNA UA negative,. Follow urine culture.  Denies diarrhea, abdominal pain.  Surgery will evaluate surgical site.  Spike fever; will add Ancef. Check blood culture  Morbid obesity: Lifestyle modifications  Hypothyroidism: Continue with Synthroid  CKD stage IIIa: Continue to monitor Mild hyperkalemia; monitor on ARB  RA;: Continue with  chronic prednisone and methotrexate  Hyperlipidemia: Continue with atorvastatin and Zetia  Diabetes type 2: Continue with Gargline  and sliding scale insulin    Estimated body mass index is 40.56 kg/m as calculated from the following:   Height as of this encounter: 5' 4.5" (1.638 m).   Weight as of this encounter: 108.9 kg.   DVT prophylaxis: Eliquis Code Status: Full code Family Communication: care discussed with patient Disposition Plan:  Status is: Observation The patient remains OBS appropriate and will d/c before 2 midnights.      Subjective: She had BM,. She denies cough, dysuria. Feels weak  Objective: Vitals:   10/01/22 1639 10/01/22 2020 10/02/22 0513 10/02/22 0740  BP: (!) 170/76 (!) 156/78 (!) 163/73 (!) 179/59  Pulse: 67 87 82 83  Resp: '19 17 16   '$ Temp: 98.7 F (37.1 C) 98 F (36.7 C) 99.3 F (37.4 C) 99.9 F (37.7 C)  TempSrc: Oral Oral Oral Oral  SpO2: 99% 98% 98% 98%  Weight:      Height:        Intake/Output Summary (Last 24 hours) at 10/02/2022 1032 Last data filed at 10/02/2022 0900 Gross per 24 hour  Intake 550 ml  Output 500 ml  Net 50 ml    Filed Weights   09/26/22 1303  Weight: 108.9 kg    Examination:  General exam: NAD Respiratory system: CTA Cardiovascular system: S 1, S 2 RRR Gastrointestinal system: BS present soft, nt Central nervous system: Alert, non focal.  Extremities: Right LE with dressing.   Data Reviewed: I have personally reviewed following labs and imaging studies  CBC: Recent Labs  Lab 09/27/22 0620 09/28/22 0749 09/30/22 0826 10/01/22 0353  10/02/22 0408  WBC 8.3 7.2 11.5* 13.7* 17.8*  HGB 9.1* 7.9* 7.3* 7.1* 7.3*  HCT 30.0* 25.5* 23.3* 22.5* 22.2*  MCV 94.6 93.4 92.1 91.5 87.7  PLT 165 148* 181 187 094    Basic Metabolic Panel: Recent Labs  Lab 09/26/22 1914 09/30/22 0826 10/01/22 0353 10/02/22 0408  NA  --   --  136 131*  K  --   --  5.1 5.0  CL  --   --  102 100  CO2  --   --  25 25   GLUCOSE  --   --  123* 103*  BUN  --   --  15 17  CREATININE 1.06* 1.10* 1.08* 1.05*  CALCIUM  --   --  8.9 8.5*    GFR: Estimated Creatinine Clearance: 64.1 mL/min (A) (by C-G formula based on SCr of 1.05 mg/dL (H)). Liver Function Tests: No results for input(s): "AST", "ALT", "ALKPHOS", "BILITOT", "PROT", "ALBUMIN" in the last 168 hours. No results for input(s): "LIPASE", "AMYLASE" in the last 168 hours. No results for input(s): "AMMONIA" in the last 168 hours. Coagulation Profile: No results for input(s): "INR", "PROTIME" in the last 168 hours. Cardiac Enzymes: No results for input(s): "CKTOTAL", "CKMB", "CKMBINDEX", "TROPONINI" in the last 168 hours. BNP (last 3 results) No results for input(s): "PROBNP" in the last 8760 hours. HbA1C: No results for input(s): "HGBA1C" in the last 72 hours. CBG: Recent Labs  Lab 10/01/22 0849 10/01/22 1143 10/01/22 1624 10/01/22 2040 10/02/22 0741  GLUCAP 149* 121* 136* 144* 99    Lipid Profile: No results for input(s): "CHOL", "HDL", "LDLCALC", "TRIG", "CHOLHDL", "LDLDIRECT" in the last 72 hours. Thyroid Function Tests: No results for input(s): "TSH", "T4TOTAL", "FREET4", "T3FREE", "THYROIDAB" in the last 72 hours. Anemia Panel: No results for input(s): "VITAMINB12", "FOLATE", "FERRITIN", "TIBC", "IRON", "RETICCTPCT" in the last 72 hours. Sepsis Labs: No results for input(s): "PROCALCITON", "LATICACIDVEN" in the last 168 hours.  Recent Results (from the past 240 hour(s))  Urine Culture     Status: Abnormal (Preliminary result)   Collection Time: 10/01/22 10:01 AM   Specimen: Urine, Clean Catch  Result Value Ref Range Status   Specimen Description URINE, CLEAN CATCH  Final   Special Requests NONE  Final   Culture (A)  Final    30,000 COLONIES/mL STAPHYLOCOCCUS EPIDERMIDIS SUSCEPTIBILITIES TO FOLLOW Performed at [facility], [address], [place], [place] [address]    Report Status PENDING  Incomplete          Radiology Studies: DG CHEST PORT 1 VIEW  Result Date: 10/02/2022 CLINICAL DATA:  Leukocytosis EXAM: PORTABLE CHEST 1 VIEW COMPARISON:  Chest two views 07/16/2017; right humerus radiographs 05/22/2020 FINDINGS: Cardiac silhouette and mediastinal contours within normal limits. The lungs are clear. No pleural effusion or pneumothorax. Moderate multilevel disc space narrowing and bridging osteophytes of the thoracic spine. Partial visualization of orthopedic screw hardware within the proximal right humeral diaphysis, new from 07/06/2018 chest radiograph but seen on 05/22/2020 right humerus radiographs. IMPRESSION: No acute cardiopulmonary disease process. Electronically Signed   By: Yvonne Kendall M.D.   On: 10/02/2022 08:35        Scheduled Meds:  sodium chloride   Intravenous Once   sodium chloride   Intravenous Once   acetaminophen  1,000 mg Oral Q8H   amLODipine  10 mg Oral Daily   apixaban  5 mg Oral BID   atorvastatin  40 mg Oral Daily   cholecalciferol  5,000 Units Oral  Daily   docusate sodium  100 mg Oral BID   escitalopram  20 mg Oral Daily   ezetimibe  10 mg Oral Daily   ferrous sulfate  325 mg Oral Q breakfast   folic acid  2 mg Oral Daily   gabapentin  100 mg Oral BID   gabapentin  300 mg Oral QHS   hydrALAZINE  100 mg Oral TID   hydrALAZINE  25 mg Oral Once   insulin aspart  0-15 Units Subcutaneous TID WC   insulin aspart  4 Units Subcutaneous TID WC   insulin glargine-yfgn  42 Units Subcutaneous QHS   irbesartan  300 mg Oral Daily   latanoprost  1 drop Both Eyes QHS   levothyroxine  125 mcg Oral Once per day on Mon Tue Wed Thu Fri   loratadine  10 mg Oral Daily   methocarbamol  750 mg Oral QID   metoprolol succinate  25 mg Oral Daily   predniSONE  5 mg Oral Daily   sulfaSALAzine  1,500 mg Oral BID   Continuous Infusions:   LOS: 0 days    Time spent: 35 minutes.     Elmarie Shiley, MD [facility]   If 7PM-7AM, please contact  night-coverage [email or web address]  10/02/2022, 10:32 AM

## 2022-10-02 NOTE — Progress Notes (Signed)
   10/01/22 2020  Vitals  Temp 98 F (36.7 C)  Temp Source Oral  BP (!) 156/78  BP Location Right Arm  BP Method Automatic  Patient Position (if appropriate) Lying  Pulse Rate 87  Pulse Rate Source Dinamap  Resp 17  Level of Consciousness  Level of Consciousness Alert  Oxygen Therapy  SpO2 98 %  O2 Device Room Air

## 2022-10-02 NOTE — Progress Notes (Signed)
Physical Therapy Treatment Patient Details Name: Jocelyn Kaufman MRN: [id number] DOB: July 01, 1956 Today's Date: 10/02/2022   History of Present Illness 66 yo female admitted 11/29 for Rt quad tendon repair revision due to falls. Initial injury in July with repair 05/30/22. PMH: DM, HTN, fibromyalgia, hypothyroidism, RA, CKD, afib, asthma, ORIF of RUE     PT Comments Pt is continuing with difficulty with standing due to WB status and she does not have adequate strength to maneuver with the LLE only. Pt may benefit from use of slide board and W/C in order to increase mobility and independence due to pt is very frustrated at her independence level at this time. Pt will benefit from skilled physical therapy services on discharge from acute care hospital setting at a SNF in order to return to PLOF. Pt demonstrates no signs/symptoms of cardiac/respiratory distress with activity      Recommendations for follow up therapy are one component of a multi-disciplinary discharge planning process, led by the attending physician.  Recommendations may be updated based on patient status, additional functional criteria and insurance authorization.  Follow Up Recommendations  Skilled nursing-short term rehab (<3 hours/day) Can patient physically be transported by private vehicle: No   Assistance Recommended at Discharge Frequent or constant Supervision/Assistance  Patient can return home with the following Two people to help with walking and/or transfers   Equipment Recommendations  None recommended by PT    Recommendations for Other Services       Precautions / Restrictions Precautions Precautions: Fall Required Braces or Orthoses: Splint/Cast Knee Immobilizer - Right: On at all times Restrictions Weight Bearing Restrictions: Yes RLE Weight Bearing: Non weight bearing Other Position/Activity Restrictions: TDWB for balance only but pt unable to maintain     Mobility  Bed Mobility Overal bed mobility:  Needs Assistance Bed Mobility: Supine to Sit, Sit to Supine     Supine to sit: Min assist Sit to supine: Min assist   General bed mobility comments: Pt requires Min A with RLE for Supine<>sit with intermittent verbal cues for positioning    Transfers Overall transfer level: Needs assistance Equipment used: Rolling walker (2 wheels) Transfers: Sit to/from Stand Sit to Stand: From elevated surface, Max assist           General transfer comment: Attempted 4x with no clearance for first attempt, partial clearance for attempts 2 and 3 and then full upright standing for attempt 4. Pt had arm rest on recline to push up from with RUE with therapist foot under RLE for maintaining WB precautions and verbal cues for sequencing with hand placement to achieve full upright posture. Pt maintained WB precautions 90% of the time with slight increase in Weight on the RLE when fully upright that pt was able to correct with heavy use of bil UE on RW.    Ambulation/Gait               General Gait Details: unable   Stairs             Wheelchair Mobility    Modified Rankin (Stroke Patients Only)       Balance Overall balance assessment: History of Falls, Needs assistance Sitting-balance support: No upper extremity supported, Bilateral upper extremity supported, Feet supported Sitting balance-Leahy Scale: Fair Sitting balance - Comments: pt was able to scoot forward/back and to the L with UE assist and therapist holding the RLE to maintain WB precautions   Standing balance support: Bilateral upper extremity supported, Reliant on assistive device  for balance Standing balance-Leahy Scale: Fair                              Cognition Arousal/Alertness: Awake/alert Behavior During Therapy: WFL for tasks assessed/performed Overall Cognitive Status: Within Functional Limits for tasks assessed           Exercises      General Comments General comments (skin  integrity, edema, etc.): Pt noted to be very hard on self and needed more education on levels of therapy and the need for SNF.      Pertinent Vitals/Pain Pain Assessment Pain Assessment: Faces Faces Pain Scale: Hurts even more Pain Location: Right thigh with movement Pain Descriptors / Indicators: Aching, Sore Pain Intervention(s): Limited activity within patient's tolerance, Monitored during session, Repositioned    Home Living   Prior Function    PT Goals (current goals can now be found in the care plan section) Acute Rehab PT Goals PT Goal Formulation: With patient Time For Goal Achievement: 10/11/22 Potential to Achieve Goals: Fair Progress towards PT goals: Progressing toward goals    Frequency    Min 3X/week      PT Plan Current plan remains appropriate    Co-evaluation     PT goals addressed during session: Mobility/safety with mobility;Strengthening/ROM        AM-PAC PT "6 Clicks" Mobility   Outcome Measure  Help needed turning from your back to your side while in a flat bed without using bedrails?: A Little Help needed moving from lying on your back to sitting on the side of a flat bed without using bedrails?: A Little Help needed moving to and from a bed to a chair (including a wheelchair)?: Total Help needed standing up from a chair using your arms (e.g., wheelchair or bedside chair)?: Total Help needed to walk in hospital room?: Total Help needed climbing 3-5 steps with a railing? : Total 6 Click Score: 10    End of Session Equipment Utilized During Treatment: Gait belt Activity Tolerance: Patient limited by pain;Other (comment) (and nausea) Patient left: in bed;with call bell/phone within reach;with bed alarm set Nurse Communication: Mobility status PT Visit Diagnosis: Muscle weakness (generalized) (M62.81);History of falling (Z91.81);Other abnormalities of gait and mobility (R26.89)     Time: 0973-5329 PT Time Calculation (min) (ACUTE ONLY):  33 min  Charges:  $Therapeutic Activity: 23-37 mins                     Tomma Rakers, DPT, CLT  Acute Rehabilitation Services Office: [phone] (Secure chat preferred)    Jocelyn Kaufman 10/02/2022, 12:31 PM

## 2022-10-02 NOTE — TOC Progression Note (Signed)
Transition of Care [facility]) - Progression Note    Patient Details  Name: Jocelyn Kaufman MRN: [id number] Date of Birth: 05-Apr-1956  Transition of [facility]) CM/SW Contact  Joanne Chars, LCSW Phone Number: 10/02/2022, 12:03 PM  Clinical Narrative:   Per MD, pt not stable for DC.  [place] updated.  Auth expires today.    Expected Discharge Plan: [place] Barriers to Discharge: Continued Medical Work up, SNF Pending bed offer  Expected Discharge Plan and Services Expected Discharge Plan: [place] In-house Referral: Clinical Social Work   Post Acute Care Choice: [place] Living arrangements for the past 2 months: Single Family Home Expected Discharge Date: 09/28/22                                     Social Determinants of Health (SDOH) Interventions    Readmission Risk Interventions     No data to display

## 2022-10-02 NOTE — Progress Notes (Signed)
Pharmacy Antibiotic Note  Jocelyn Kaufman is a 66 y.o. female admitted on 09/26/2022 with R tendon repair revision due to falls  Patient with inc WBC (also on low-dose chronic prednisone) and intermittent fevers. Pharmacy has been consulted for cefazolin dosing. Ortho to eval surgical site.   Plan: Cefazolin 2g IV q8 hours Follow up infectious work-up, ability to narrow/discontinue antibiotics  Height: 5' 4.5" (163.8 cm) Weight: 108.9 kg (240 lb) IBW/kg (Calculated) : 55.85  Temp (24hrs), Avg:99.1 F (37.3 C), Min:98 F (36.7 C), Max:100.6 F (38.1 C)  Recent Labs  Lab 09/26/22 1914 09/27/22 0620 09/28/22 0749 09/30/22 0826 10/01/22 0353 10/02/22 0408  WBC 7.4 8.3 7.2 11.5* 13.7* 17.8*  CREATININE 1.06*  --   --  1.10* 1.08* 1.05*    Estimated Creatinine Clearance: 64.1 mL/min (A) (by C-G formula based on SCr of 1.05 mg/dL (H)).    Allergies  Allergen Reactions   Semaglutide(0.25 Or 0.'5mg'$ -Dos) Other (See Comments)    Causes cataracts requiring surgery and impairing vision    Shellfish-Derived Products Anaphylaxis, Swelling and Other (See Comments)    Patient avoids   Empagliflozin Itching, Swelling and Other (See Comments)    UTI sx's and yeast infections   Rho (D) Immune Globulin      Not available   Tramadol Hcl Other (See Comments)    hallucinations   Canagliflozin Other (See Comments)    Urinary incontinence    Latex Rash   Liraglutide Itching    Other reaction(s): itching all over   Minoxidil Rash    skin irritation   Omeprazole Rash   Percocet [Oxycodone-Acetaminophen] Nausea And Vomiting    Antimicrobials this admission: Cefazolin 12/5 >>   Dose adjustments this admission:  Microbiology results: 12/4 UCx: 30K S.epi   Thank you for allowing pharmacy to be a part of this patient's care.  Dimple Nanas, PharmD, BCPS 10/02/2022 1:31 PM

## 2022-10-02 NOTE — Progress Notes (Signed)
Occupational Therapy Treatment Patient Details Name: Jocelyn Kaufman MRN: [id number] DOB: 05-Mar-1956 Today's Date: 10/02/2022   History of present illness 66 yo female admitted 11/29 for Rt quad tendon repair revision due to falls. Initial injury in July with repair 05/30/22. PMH: DM, HTN, fibromyalgia, hypothyroidism, RA, CKD, afib, asthma, ORIF of RUE   OT comments  Pt is making slow progression at this time. Pt completed bed mobility with mod assist and attempted to complete transfers but noted to become frustrated with how much assist they need at the current time. Pt then agreed to work on lateral scooting at [place] with min-mod assist. Pt was able to complete UE dressing post set up at the EOB with no LOB. Pt currently with functional limitations due to the deficits listed below (see OT Problem List).  Pt will benefit from skilled OT to increase their safety and independence with ADL and functional mobility for ADL to facilitate discharge to venue listed below.     Recommendations for follow up therapy are one component of a multi-disciplinary discharge planning process, led by the attending physician.  Recommendations may be updated based on patient status, additional functional criteria and insurance authorization.    Follow Up Recommendations  Skilled nursing-short term rehab (<3 hours/day)     Assistance Recommended at Discharge Frequent or constant Supervision/Assistance  Patient can return home with the following  Two people to help with walking and/or transfers;Two people to help with bathing/dressing/bathroom;Assistance with cooking/housework;Assist for transportation   Equipment Recommendations  Other (comment) (TBD)    Recommendations for Other Services      Precautions / Restrictions Precautions Precautions: Fall Required Braces or Orthoses: Splint/Cast Knee Immobilizer - Right: On at all times Restrictions Weight Bearing Restrictions: Yes RLE Weight Bearing: Non weight  bearing Other Position/Activity Restrictions: TDWB for balance only but pt unable to maintain       Mobility Bed Mobility Overal bed mobility: Needs Assistance Bed Mobility: Supine to Sit, Sit to Supine     Supine to sit: Mod assist Sit to supine: Mod assist   General bed mobility comments: Pt need mod assist with RLE and pad to be able to pivot to EOB and back into supine.    Transfers Overall transfer level: Needs assistance Equipment used: Rolling walker (2 wheels) Transfers: Sit to/from Stand Sit to Stand: From elevated surface, Max assist           General transfer comment: attempted to complete multiple times but pt started to become frustrated so worked on lateral scooting to possibly complete slidding board transfers     Balance Overall balance assessment: History of Falls, Needs assistance Sitting-balance support: No upper extremity supported, Bilateral upper extremity supported, Feet supported Sitting balance-Leahy Scale: Fair Sitting balance - Comments: Pt was able to complete UE dressing while sitting at EOB with no LOB   Standing balance support: Bilateral upper extremity supported, Reliant on assistive device for balance Standing balance-Leahy Scale: Poor                             ADL either performed or assessed with clinical judgement   ADL Overall ADL's : Needs assistance/impaired Eating/Feeding: Set up;Sitting   Grooming: Wash/dry hands;Wash/dry face;Set up;Sitting   Upper Body Bathing: Set up;Sitting;Cueing for safety;Cueing for sequencing   Lower Body Bathing: Maximal assistance;Total assistance;Cueing for safety;Cueing for sequencing;Sitting/lateral leans;Bed level   Upper Body Dressing : Set up;Sitting   Lower Body Dressing: Maximal  assistance;Total assistance;Cueing for safety;Cueing for sequencing;Bed level;Sitting/lateral leans                 General ADL Comments: Attempted to completed transfer but got frustrated in  session and worked on lateral scooting at [employer]    Extremity/Trunk Assessment Upper Extremity Assessment Upper Extremity Assessment: RUE deficits/detail;LUE deficits/detail RUE Deficits / Details: Pt reporting today both BUE feel weak and can be tender due to RA with any mobility or WB. As pt on prior sessions reporting RUE weaker then LUE RUE Sensation: WNL RUE Coordination: WNL LUE Deficits / Details: Pt reporting today both BUE feel weak and can be tender due to RA with any mobility or WB LUE Sensation: WNL LUE Coordination: WNL   Lower Extremity Assessment Lower Extremity Assessment: Defer to PT evaluation        Vision       Perception     Praxis      Cognition Arousal/Alertness: Awake/alert Behavior During Therapy: WFL for tasks assessed/performed Overall Cognitive Status: Within Functional Limits for tasks assessed                                 General Comments: Pt noted they like step by step instructions on all tasks        Exercises      Shoulder Instructions       General Comments Pt noted to be very hard on self and needed more education on levels of therapy and the need for SNF.    Pertinent Vitals/ Pain       Pain Assessment Pain Assessment: Faces Faces Pain Scale: Hurts even more Pain Location: Right thigh with movement Pain Descriptors / Indicators: Aching, Sore Pain Intervention(s): Limited activity within patient's tolerance, Monitored during session, Repositioned, Premedicated before session  Home Living                                          Prior Functioning/Environment              Frequency  Min 2X/week        Progress Toward Goals  OT Goals(current goals can now be found in the care plan section)  Progress towards OT goals: Progressing toward goals  Acute Rehab OT Goals Patient Stated Goal: to be able to do more OT Goal Formulation: With patient Time For Goal Achievement:  10/11/22 Potential to Achieve Goals: Good ADL Goals Pt Will Perform Grooming: Independently;sitting Pt Will Perform Upper Body Bathing: Independently;sitting Pt Will Perform Lower Body Bathing: with max assist;with adaptive equipment;sitting/lateral leans Pt Will Transfer to Toilet: with mod assist;stand pivot transfer;bedside commode  Plan Discharge plan remains appropriate    Co-evaluation                 AM-PAC OT "6 Clicks" Daily Activity     Outcome Measure   Help from another person eating meals?: None Help from another person taking care of personal grooming?: A Little Help from another person toileting, which includes using toliet, bedpan, or urinal?: Total Help from another person bathing (including washing, rinsing, drying)?: A Lot Help from another person to put on and taking off regular upper body clothing?: A Little Help from another person to put on and taking off regular lower body clothing?: A Lot 6 Click Score: 15  End of Session Equipment Utilized During Treatment: Gait belt;Rolling walker (2 wheels)  OT Visit Diagnosis: Unsteadiness on feet (R26.81);Other abnormalities of gait and mobility (R26.89);Repeated falls (R29.6);Muscle weakness (generalized) (M62.81);History of falling (Z91.81);Pain Pain - Right/Left: Right Pain - part of body: Leg   Activity Tolerance Patient limited by fatigue;No increased pain   Patient Left in bed;with call bell/phone within reach;with bed alarm set   Nurse Communication          Time: 0086-7619 OT Time Calculation (min): 43 min  Charges: OT General Charges $OT Visit: 1 Visit OT Treatments $Self Care/Home Management : 38-52 mins  Joeseph Amor OTR/L  Acute Rehab Services  [phone] office number [phone] pager number   Joeseph Amor 10/02/2022, 10:54 AM

## 2022-10-03 DIAGNOSIS — S76111S Strain of right quadriceps muscle, fascia and tendon, sequela: Secondary | ICD-10-CM | POA: Diagnosis not present

## 2022-10-03 LAB — BPAM RBC
Blood Product Expiration Date: 202312112359
Blood Product Expiration Date: 202312202359
ISSUE DATE / TIME: 202312041406
ISSUE DATE / TIME: 202312051549
Unit Type and Rh: 6200
Unit Type and Rh: 6200

## 2022-10-03 LAB — CBC WITH DIFFERENTIAL/PLATELET
Abs Immature Granulocytes: 0.09 10*3/uL — ABNORMAL HIGH (ref 0.00–0.07)
Basophils Absolute: 0.1 10*3/uL (ref 0.0–0.1)
Basophils Relative: 0 %
Eosinophils Absolute: 0.1 10*3/uL (ref 0.0–0.5)
Eosinophils Relative: 1 %
HCT: 23.7 % — ABNORMAL LOW (ref 36.0–46.0)
Hemoglobin: 7.9 g/dL — ABNORMAL LOW (ref 12.0–15.0)
Immature Granulocytes: 1 %
Lymphocytes Relative: 7 %
Lymphs Abs: 1.1 10*3/uL (ref 0.7–4.0)
MCH: 29 pg (ref 26.0–34.0)
MCHC: 33.3 g/dL (ref 30.0–36.0)
MCV: 87.1 fL (ref 80.0–100.0)
Monocytes Absolute: 1.3 10*3/uL — ABNORMAL HIGH (ref 0.1–1.0)
Monocytes Relative: 8 %
Neutro Abs: 12.8 10*3/uL — ABNORMAL HIGH (ref 1.7–7.7)
Neutrophils Relative %: 83 %
Platelets: 155 10*3/uL (ref 150–400)
RBC: 2.72 MIL/uL — ABNORMAL LOW (ref 3.87–5.11)
RDW: 17.1 % — ABNORMAL HIGH (ref 11.5–15.5)
WBC: 15.4 10*3/uL — ABNORMAL HIGH (ref 4.0–10.5)
nRBC: 0.4 % — ABNORMAL HIGH (ref 0.0–0.2)

## 2022-10-03 LAB — GLUCOSE, CAPILLARY
Glucose-Capillary: 123 mg/dL — ABNORMAL HIGH (ref 70–99)
Glucose-Capillary: 146 mg/dL — ABNORMAL HIGH (ref 70–99)
Glucose-Capillary: 134 mg/dL — ABNORMAL HIGH (ref 70–99)
Glucose-Capillary: 179 mg/dL — ABNORMAL HIGH (ref 70–99)

## 2022-10-03 LAB — TYPE AND SCREEN
ABO/RH(D): A POS
Antibody Screen: NEGATIVE
Unit division: 0
Unit division: 0

## 2022-10-03 LAB — URINE CULTURE: Culture: 30000 — AB

## 2022-10-03 LAB — BASIC METABOLIC PANEL
Anion gap: 10 (ref 5–15)
BUN: 22 mg/dL (ref 8–23)
CO2: 26 mmol/L (ref 22–32)
Calcium: 8.8 mg/dL — ABNORMAL LOW (ref 8.9–10.3)
Chloride: 98 mmol/L (ref 98–111)
Creatinine, Ser: 1.45 mg/dL — ABNORMAL HIGH (ref 0.44–1.00)
GFR, Estimated: 40 mL/min — ABNORMAL LOW (ref >60)
Glucose, Bld: 134 mg/dL — ABNORMAL HIGH (ref 70–99)
Potassium: 4.9 mmol/L (ref 3.5–5.1)
Sodium: 134 mmol/L — ABNORMAL LOW (ref 135–145)

## 2022-10-03 MED ORDER — PROSOURCE PLUS PO LIQD
30.0000 mL | Freq: Three times a day (TID) | ORAL | Status: DC
  Administered 2022-10-03: 30 mL via ORAL
  Filled 2022-10-03 (×2): qty 30

## 2022-10-03 MED ORDER — JUVEN PO PACK
1.0000 | PACK | Freq: Two times a day (BID) | ORAL | Status: DC
  Administered 2022-10-03: 1 via ORAL
  Filled 2022-10-03: qty 1

## 2022-10-03 NOTE — Progress Notes (Signed)
   ORTHOPAEDIC PROGRESS NOTE  s/p Procedure(s): REVISION REPAIR QUADRICEP TENDON LIGAMENT REPAIR/knee on 09/26/2022 with Dr. Griffin Basil  SUBJECTIVE: Patient is happy with how she did with therapy today. Still having occasional sharp shooting pain. I updated her on creatinine levels increasing and the need for a new authorization for SNF. She is understanding of this. She requested I reach out to Dr. Royce Kaufman, her nephrologist at [facility].   No chest pain. No SOB. No nausea/vomiting. No other complaints.  OBJECTIVE: PE: General: resting in hospital bed, NAD RLE: splint CDI, +EHL though remainder of motor difficult to test due to splint, sensation intact distally with warm well perfused foot, no pain w passive stretch, warm well perfused extremity  Image taken 10/02/22  Vitals:   10/03/22 0500 10/03/22 0900  BP: (!) 120/57 (!) 137/50  Pulse: 80 80  Resp: 17 18  Temp: 99.6 F (37.6 C) 98.3 F (36.8 C)  SpO2: 99% 98%     ASSESSMENT: Jocelyn Kaufman is a 66 y.o. female  POD#7  PLAN: Weightbearing: TDWB in splint Insicional and dressing care: Dressings left intact until follow-up. A cast will be placed at her first post-op visit in the office.  Orthopedic device(s): Splint Showering: Hold for now VTE prophylaxis: Eliquis Pain control: PRN pain medications, minimize narcotics as able. Discontinued Dilaudid.  Tylenol 1000 mg every 8 hours Robaxin 750 mg every 8 hours Gabapentin 100 mg qAM, 100 mg midday, 300 mg qPM Oxycodone 5-10 mg every 6 hours only as needed for SEVERE PAIN  - we discussed weaning off oxycodone as well 5. [employer] IceMan - order was placed to have this sent to the patient from the OR/PACU  Continue to wean from narcotics.   Acute on chronic anemia: Hgb 7.9 this AM.  Leukocytosis: WBC trending down  - There are no signs of infection at patient's surgical site. Incision is healing well. New dressing placed yesterday.  - Appreciate hospitalist team  help in managing this patient.  - Hospitalist team has discontinued antibiotics, she is afebrile CKD: Due to increase in her creatinine levels from baseline, hospitalist team recommending continued monitoring. Patient requested I call her nephrologist, Dr. Royce Kaufman. I have called Dr. Luis Kaufman office [phone] and left a message for her team to call me back so I can update them on the patient's status.  Follow - up plan: 2 weeks in office Dispo: SNF. PT/OT recommending SNF.  Authorization for SNF has expired. TOC following. New authorization has been sent.   Appreciate hospitalist team for helping manage patient's co-morbidities. Patient's Hgb is trending up and WBC trending down. However, her creatinine is higher than baseline. We will continue to monitor. Okay for discharge when she is cleared medically.   Contact information:  Weekdays 8-5 Dr. Ophelia Kaufman, Jocelyn Chapel PA-C, After hours and holidays please check [email or web address] for group call information for [facility]   Jocelyn Chapel, PA-C 10/03/2022

## 2022-10-03 NOTE — Progress Notes (Signed)
Initial Nutrition Assessment  DOCUMENTATION CODES:   Morbid obesity  INTERVENTION:  1 packet Juven BID, each packet provides 95 calories, 2.5 grams of protein (collagen), and 9.8 grams of carbohydrate (3 grams sugar); also contains 7 grams of L-arginine and L-glutamine, 300 mg vitamin C, 15 mg vitamin E, 1.2 mcg vitamin B-12, 9.5 mg zinc, 200 mg calcium, and 1.5 g  Calcium Beta-hydroxy-Beta-methylbutyrate to support wound healing 30 ml ProSource Plus TID, each supplement provides 100 kcals and 15 grams protein.   NUTRITION DIAGNOSIS:   Increased nutrient needs related to wound healing as evidenced by estimated needs.  GOAL:   Patient will meet greater than or equal to 90% of their needs  MONITOR:   PO intake, Supplement acceptance, Labs, Weight trends  REASON FOR ASSESSMENT:   Consult Assessment of nutrition requirement/status, Wound healing  ASSESSMENT:   Pt admitted with R quad tendon rupture with instability. PMH significant for depression, diabetes, HTN, HLD, RA, afib, hypothyroidism.  S/p revision of quadriceps with allograft as well as medial and lateral retinacular repair.  Not stable for d/c d/t leukocytosis and acute on chronic anemia requiring blood transfusion yesterday.   Attempted to reach pt via phone call to room x2 but received busy tone both times. Despite Ortho's documentation yesterday of pt with poor appetite, meal completions noted to be 100%. Will add supplements to support wound healing for now and adjust as appropriate upon follow up.   Meal completions: 12/3: 100% x3 recorded meals 12/4: 100% breakfast 12/5: 100% breakfast  Reviewed weight history. It appears pt's weight has remained stable overall within the last several months, between 107-109 kg. Last documented weight was 108.9 kg on 11/29.   Medications: Vitamin D3, colace, ferrous sulfate, folvite, SSI 0-15 units TID, 4 units TID, semglee 42 units daily  Labs: sodium 134, Cr 1.45, GFR 40,  HgbA1c 5.6% (10/24), CBG's 99-195 x24 hours  NUTRITION - FOCUSED PHYSICAL EXAM: RD working remotely. Deferred to follow up.   Diet Order:   Diet Order             Diet - low sodium heart healthy           Diet Carb Modified Fluid consistency: Thin; Room service appropriate? Yes  Diet effective now                   EDUCATION NEEDS:   No education needs have been identified at this time  Skin:  Skin Assessment: Reviewed RN Assessment (R leg incision (closed))  Last BM:  12/5 (type 2)  Height:   Ht Readings from Last 1 Encounters:  09/26/22 5' 4.5" (1.638 m)    Weight:   Wt Readings from Last 1 Encounters:  09/26/22 108.9 kg    Ideal Body Weight:  56.8 kg  BMI:  Body mass index is 40.56 kg/m.  Estimated Nutritional Needs:   Kcal:  1500-1700  Protein:  85-100g  Fluid:  >/=1.5L  Jocelyn Kaufman, RDN, LDN Clinical Nutrition

## 2022-10-03 NOTE — Progress Notes (Signed)
Physical Therapy Treatment Patient Details Name: Jocelyn Kaufman MRN: [id number] DOB: 08-Jun-1956 Today's Date: 10/03/2022   History of Present Illness 66 yo female admitted 11/29 for Rt quad tendon repair revision due to falls. Initial injury in July with repair 05/30/22. PMH: DM, HTN, fibromyalgia, hypothyroidism, RA, CKD, afib, asthma, ORIF of RUE    PT Comments     This session worked on alternate forms of mobility due to pt has significant difficulty with maintaining WB precautions in standing. Worked on Veterinary surgeon and W/C mobility this session with education on how to manage W/C and sliding board transfers in order to improve pt independence and mobility out of bed. Pt was compliant and accepting of education and treatment with pt able to transfer EOB<>W/C. Pt will benefit from continued skilled physical therapy services at SNF following discharge from acute care hospital setting. Pt demonstrates no signs/symptoms of cardiac or respiratory distress throughout session.    Recommendations for follow up therapy are one component of a multi-disciplinary discharge planning process, led by the attending physician.  Recommendations may be updated based on patient status, additional functional criteria and insurance authorization.  Follow Up Recommendations  Skilled nursing-short term rehab (<3 hours/day) Can patient physically be transported by private vehicle: No   Assistance Recommended at Discharge Frequent or constant Supervision/Assistance  Patient can return home with the following Two people to help with walking and/or transfers   Equipment Recommendations  None recommended by PT;Other (comment) (defer to rehab)    Recommendations for Other Services       Precautions / Restrictions Precautions Precautions: Fall Required Braces or Orthoses: Splint/Cast Knee Immobilizer - Right: On at all times Restrictions Weight Bearing Restrictions: Yes RLE Weight Bearing: Non weight  bearing Other Position/Activity Restrictions: TDWB for balance only but pt unable to maintain     Mobility  Bed Mobility Overal bed mobility: Needs Assistance Bed Mobility: Supine to Sit, Sit to Supine     Supine to sit: Min assist Sit to supine: Min assist   General bed mobility comments: Pt requires Min A with RLE for Supine<>sit with intermittent verbal cues for positioning Patient Response: Cooperative  Transfers Overall transfer level: Needs assistance                Lateral/Scoot Transfers: +2 physical assistance General transfer comment: sliding board transfer EOB<>W/C with 2 person assist.         Information systems manager mobility: Yes Wheelchair propulsion: Both upper extremities, Left lower extremity Wheelchair parts: Needs assistance Distance: 75 Wheelchair Assistance Details (indicate cue type and reason): pt requires verbal cues for use of LE and bil UE for navigating w/C  Modified Rankin (Stroke Patients Only)       Balance Overall balance assessment: History of Falls, Needs assistance Sitting-balance support: No upper extremity supported, Bilateral upper extremity supported, Feet supported Sitting balance-Leahy Scale: Fair Sitting balance - Comments: pt was able to scoot forward/back and to the L with UE assist and therapist holding the RLE to maintain WB precautions              Cognition Arousal/Alertness: Awake/alert Behavior During Therapy: WFL for tasks assessed/performed Overall Cognitive Status: Within Functional Limits for tasks assessed           General Comments: clear, single step instructions are well received               Pertinent Vitals/Pain Pain Assessment Pain Assessment: Faces Faces Pain Scale: Hurts even more Pain  Location: Right thigh with movement and intermittent spasms Pain Descriptors / Indicators: Aching, Sore, Spasm Pain Intervention(s): Limited activity within patient's  tolerance, Monitored during session, Repositioned, Ice applied     PT Goals (current goals can now be found in the care plan section) Acute Rehab PT Goals PT Goal Formulation: With patient Time For Goal Achievement: 10/11/22 Potential to Achieve Goals: Fair Progress towards PT goals: Progressing toward goals    Frequency    Min 3X/week      PT Plan Current plan remains appropriate       AM-PAC PT "6 Clicks" Mobility   Outcome Measure  Help needed turning from your back to your side while in a flat bed without using bedrails?: A Little Help needed moving from lying on your back to sitting on the side of a flat bed without using bedrails?: A Little Help needed moving to and from a bed to a chair (including a wheelchair)?: Total Help needed standing up from a chair using your arms (e.g., wheelchair or bedside chair)?: Total Help needed to walk in hospital room?: Total Help needed climbing 3-5 steps with a railing? : Total 6 Click Score: 10    End of Session Equipment Utilized During Treatment: Gait belt Activity Tolerance: Patient limited by pain;Patient tolerated treatment well Patient left: in bed;with call bell/phone within reach;with SCD's reapplied Nurse Communication: Mobility status PT Visit Diagnosis: Muscle weakness (generalized) (M62.81);History of falling (Z91.81);Other abnormalities of gait and mobility (R26.89)     Time: 7829-5621 PT Time Calculation (min) (ACUTE ONLY): 59 min  Charges:  $Therapeutic Activity: 38-52 mins $Wheel Chair Management: 8-22 mins                     Tomma Rakers, DPT, CLT  Acute Rehabilitation Services Office: [phone] (Secure chat preferred)    Ander Purpura 10/03/2022, 2:47 PM

## 2022-10-03 NOTE — Progress Notes (Addendum)
PROGRESS NOTE    MARLANE HIRSCHMANN  YDX:[id number] DOB: [birth year] DOA: 09/26/2022 PCP: Deland Pretty, MD   Brief Narrative: 66 year old with past medical history significant for depression, diabetes, hypertension, hyperlipidemia, rheumatoid arthritis, A-fib on Eliquis, hypothyroidism who presented on 11/28 with right quad tendon rupture with instability, underwent revision quadriceps repair with allograft as well as medial and lateral retinacular repair.  She has been noted to have systolic blood pressure 720-947.  We were consulted on 12/3 for management of blood pressure and chronic medical problems.     Assessment & Plan:   Principal Problem:   Quadriceps muscle rupture, right, sequela Active Problems:   Diabetes mellitus (HCC)   Essential hypertension   Mixed hyperlipidemia   Rheumatoid arthritis (HCC)   Chronic kidney disease, stage 3a (HCC)   Hypothyroid   Morbid obesity (HCC)   Pulmonary embolus (HCC)   ABLA (acute blood loss anemia)   1-Quadricept  muscle rupture,Right  -Status postrepair -Pain management per Ortho  2-Acute blood loss anemia Anemia of chronic disease Hemoglobin baseline 9-10 She follows with Dr. Sonny Dandy for Retacrit.  Received  One unit PRBC> 12/04--hb remain same 7.3, she received another blood transfusion on 10/02/2022.  Will repeat CBC per patient's request. Continue with  iron.  Patient received a dose of Retacrit 1/04.   3-HTN;  Blood pressure very well-controlled, continue hydralazine 100 TID, due to AKI, will hold Avapro.  Continue metoprolol and Norvasc.  4-History of Pulmonary embolism: Continue with Eliquis  Leukocytosis;  Chest x ray negative for PNA UA negative,. Follow urine culture.  Denies diarrhea, abdominal pain.  Surgery will evaluate surgical site.  Despite low-grade fever of 100.6 at around 1 PM on 10/02/2022, started on Ancef.  She has remained afebrile after that.  I will discontinue antibiotics, no signs of  infection.  Morbid obesity: Lifestyle modifications  Hypothyroidism: Continue with Synthroid  AKI CKD stage IIIa: Creatinine slightly elevated than her baseline, continue to monitor, stop Avapro and avoid any other nephrotoxic agents.  RA;: Continue with chronic prednisone and methotrexate  Hyperlipidemia: Continue with atorvastatin and Zetia  Diabetes type 2: Blood sugar very well-controlled, continue with Gargline 42 units and sliding scale insulin  Estimated body mass index is 40.56 kg/m as calculated from the following:   Height as of this encounter: 5' 4.5" (1.638 m).   Weight as of this encounter: 108.9 kg.   DVT prophylaxis: Eliquis Code Status: Full code Family Communication: care discussed with patient Disposition Plan:   Status is: Inpatient Remains inpatient appropriate because: Will be discharged at the discretion of primary service/orthopedics.   Subjective: Seen and examined.  She has no specific complaint.  She had a lot of questions which were answered however she still seems to be unsatisfied.  She had no further questions when I asked her.  Objective: Vitals:   10/02/22 2237 10/03/22 0130 10/03/22 0334 10/03/22 0500  BP:    (!) 120/57  Pulse:    80  Resp:    17  Temp: 99.9 F (37.7 C) 100.2 F (37.9 C) 99.6 F (37.6 C) 99.6 F (37.6 C)  TempSrc: Oral Oral Oral Oral  SpO2:    99%  Weight:      Height:        Intake/Output Summary (Last 24 hours) at 10/03/2022 0836 Last data filed at 10/03/2022 0551 Gross per 24 hour  Intake 760 ml  Output 400 ml  Net 360 ml    Filed Weights   09/26/22 1303  Weight: 108.9 kg    Examination:  General exam: Appears calm and comfortable  Respiratory system: Clear to auscultation. Respiratory effort normal. Cardiovascular system: S1 & S2 heard, RRR. No JVD, murmurs, rubs, gallops or clicks. No pedal edema. Gastrointestinal system: Abdomen is nondistended, soft and nontender. No organomegaly or masses felt.  Normal bowel sounds heard. Central nervous system: Alert and oriented. No focal neurological deficits. Skin: No rashes, lesions or ulcers.  Psychiatry: Judgement and insight appear normal. Mood & affect appropriate.    Data Reviewed: I have personally reviewed following labs and imaging studies  CBC: Recent Labs  Lab 09/27/22 0620 09/28/22 0749 09/30/22 0826 10/01/22 0353 10/02/22 0408 10/02/22 2049  WBC 8.3 7.2 11.5* 13.7* 17.8*  --   HGB 9.1* 7.9* 7.3* 7.1* 7.3* 7.8*  HCT 30.0* 25.5* 23.3* 22.5* 22.2* 22.6*  MCV 94.6 93.4 92.1 91.5 87.7  --   PLT 165 148* 181 187 162  --     Basic Metabolic Panel: Recent Labs  Lab 09/26/22 1914 09/30/22 0826 10/01/22 0353 10/02/22 0408 10/03/22 0321  NA  --   --  136 131* 134*  K  --   --  5.1 5.0 4.9  CL  --   --  102 100 98  CO2  --   --  '25 25 26  '$ GLUCOSE  --   --  123* 103* 134*  BUN  --   --  '15 17 22  '$ CREATININE 1.06* 1.10* 1.08* 1.05* 1.45*  CALCIUM  --   --  8.9 8.5* 8.8*    GFR: Estimated Creatinine Clearance: 46.5 mL/min (A) (by C-G formula based on SCr of 1.45 mg/dL (H)). Liver Function Tests: No results for input(s): "AST", "ALT", "ALKPHOS", "BILITOT", "PROT", "ALBUMIN" in the last 168 hours. No results for input(s): "LIPASE", "AMYLASE" in the last 168 hours. No results for input(s): "AMMONIA" in the last 168 hours. Coagulation Profile: No results for input(s): "INR", "PROTIME" in the last 168 hours. Cardiac Enzymes: No results for input(s): "CKTOTAL", "CKMB", "CKMBINDEX", "TROPONINI" in the last 168 hours. BNP (last 3 results) No results for input(s): "PROBNP" in the last 8760 hours. HbA1C: No results for input(s): "HGBA1C" in the last 72 hours. CBG: Recent Labs  Lab 10/02/22 0741 10/02/22 1206 10/02/22 1616 10/02/22 2017 10/03/22 0755  GLUCAP 99 118* 195* 149* 123*    Lipid Profile: No results for input(s): "CHOL", "HDL", "LDLCALC", "TRIG", "CHOLHDL", "LDLDIRECT" in the last 72 hours. Thyroid  Function Tests: No results for input(s): "TSH", "T4TOTAL", "FREET4", "T3FREE", "THYROIDAB" in the last 72 hours. Anemia Panel: No results for input(s): "VITAMINB12", "FOLATE", "FERRITIN", "TIBC", "IRON", "RETICCTPCT" in the last 72 hours. Sepsis Labs: No results for input(s): "PROCALCITON", "LATICACIDVEN" in the last 168 hours.  Recent Results (from the past 240 hour(s))  Urine Culture     Status: Abnormal   Collection Time: 10/01/22 10:01 AM   Specimen: Urine, Clean Catch  Result Value Ref Range Status   Specimen Description URINE, CLEAN CATCH  Final   Special Requests   Final    NONE Performed at [facility], [address], [place], [place] [address]    Culture 30,000 COLONIES/mL STAPHYLOCOCCUS EPIDERMIDIS (A)  Final   Report Status 10/03/2022 FINAL  Final   Organism ID, Bacteria STAPHYLOCOCCUS EPIDERMIDIS (A)  Final      Susceptibility   Staphylococcus epidermidis - MIC*    CIPROFLOXACIN <=0.5 SENSITIVE Sensitive     GENTAMICIN <=0.5 SENSITIVE Sensitive     NITROFURANTOIN <=16  SENSITIVE Sensitive     OXACILLIN >=4 RESISTANT Resistant     TETRACYCLINE 2 SENSITIVE Sensitive     VANCOMYCIN 1 SENSITIVE Sensitive     TRIMETH/SULFA <=10 SENSITIVE Sensitive     CLINDAMYCIN RESISTANT Resistant     RIFAMPIN <=0.5 SENSITIVE Sensitive     Inducible Clindamycin POSITIVE Resistant     * 30,000 COLONIES/mL STAPHYLOCOCCUS EPIDERMIDIS         Radiology Studies: DG CHEST PORT 1 VIEW  Result Date: 10/02/2022 CLINICAL DATA:  Leukocytosis EXAM: PORTABLE CHEST 1 VIEW COMPARISON:  Chest two views 07/16/2017; right humerus radiographs 05/22/2020 FINDINGS: Cardiac silhouette and mediastinal contours within normal limits. The lungs are clear. No pleural effusion or pneumothorax. Moderate multilevel disc space narrowing and bridging osteophytes of the thoracic spine. Partial visualization of orthopedic screw hardware within the proximal right humeral diaphysis, new from 07/06/2018 chest  radiograph but seen on 05/22/2020 right humerus radiographs. IMPRESSION: No acute cardiopulmonary disease process. Electronically Signed   By: Yvonne Kendall M.D.   On: 10/02/2022 08:35        Scheduled Meds:  sodium chloride   Intravenous Once   acetaminophen  1,000 mg Oral Q8H   amLODipine  10 mg Oral Daily   apixaban  5 mg Oral BID   atorvastatin  40 mg Oral Daily   cholecalciferol  5,000 Units Oral Daily   docusate sodium  100 mg Oral BID   escitalopram  20 mg Oral Daily   ezetimibe  10 mg Oral Daily   ferrous sulfate  325 mg Oral Q breakfast   folic acid  2 mg Oral Daily   gabapentin  100 mg Oral BID   gabapentin  300 mg Oral QHS   hydrALAZINE  100 mg Oral TID   insulin aspart  0-15 Units Subcutaneous TID WC   insulin aspart  4 Units Subcutaneous TID WC   insulin glargine-yfgn  42 Units Subcutaneous QHS   latanoprost  1 drop Both Eyes QHS   levothyroxine  125 mcg Oral Once per day on Mon Tue Wed Thu Fri   loratadine  10 mg Oral Daily   methocarbamol  750 mg Oral QID   metoprolol succinate  25 mg Oral Daily   predniSONE  5 mg Oral Daily   sulfaSALAzine  1,500 mg Oral BID   Continuous Infusions:   ceFAZolin (ANCEF) IV 2 g (10/03/22 0627)     LOS: 1 day    Darliss Cheney, MD [facility]   If 7PM-7AM, please contact night-coverage [email or web address]  10/03/2022, 8:36 AM

## 2022-10-03 NOTE — TOC Progression Note (Signed)
Transition of Care [facility]) - Progression Note    Patient Details  Name: Jocelyn Kaufman MRN: [id number] Date of Birth: [birth year]  Transition of Care [facility]) CM/SW Contact  Joanne Chars, LCSW Phone Number: 10/03/2022, 11:21 AM  Clinical Narrative: No DC today, per MD.  Lorie Apley request submitted in [place].   CSW spoke with pt and SNF to update.    Expected Discharge Plan: [place] Barriers to Discharge: Continued Medical Work up, SNF Pending bed offer  Expected Discharge Plan and Services Expected Discharge Plan: [place] In-house Referral: Clinical Social Work   Post Acute Care Choice: [place] Living arrangements for the past 2 months: Single Family Home Expected Discharge Date: 09/28/22                                     Social Determinants of Health (SDOH) Interventions    Readmission Risk Interventions     No data to display

## 2022-10-04 ENCOUNTER — Ambulatory Visit: Payer: Medicare PPO | Admitting: Adult Health

## 2022-10-04 DIAGNOSIS — E240 Pituitary-dependent Cushing's disease: Secondary | ICD-10-CM | POA: Diagnosis not present

## 2022-10-04 DIAGNOSIS — Z9181 History of falling: Secondary | ICD-10-CM | POA: Diagnosis not present

## 2022-10-04 DIAGNOSIS — I4891 Unspecified atrial fibrillation: Secondary | ICD-10-CM | POA: Diagnosis not present

## 2022-10-04 DIAGNOSIS — E1169 Type 2 diabetes mellitus with other specified complication: Secondary | ICD-10-CM | POA: Diagnosis not present

## 2022-10-04 DIAGNOSIS — Z8639 Personal history of other endocrine, nutritional and metabolic disease: Secondary | ICD-10-CM | POA: Diagnosis not present

## 2022-10-04 DIAGNOSIS — M797 Fibromyalgia: Secondary | ICD-10-CM | POA: Diagnosis not present

## 2022-10-04 DIAGNOSIS — E782 Mixed hyperlipidemia: Secondary | ICD-10-CM | POA: Diagnosis not present

## 2022-10-04 DIAGNOSIS — R2681 Unsteadiness on feet: Secondary | ICD-10-CM | POA: Diagnosis not present

## 2022-10-04 DIAGNOSIS — E039 Hypothyroidism, unspecified: Secondary | ICD-10-CM | POA: Diagnosis not present

## 2022-10-04 DIAGNOSIS — D500 Iron deficiency anemia secondary to blood loss (chronic): Secondary | ICD-10-CM | POA: Diagnosis not present

## 2022-10-04 DIAGNOSIS — I2699 Other pulmonary embolism without acute cor pulmonale: Secondary | ICD-10-CM | POA: Diagnosis not present

## 2022-10-04 DIAGNOSIS — F419 Anxiety disorder, unspecified: Secondary | ICD-10-CM | POA: Diagnosis not present

## 2022-10-04 DIAGNOSIS — E559 Vitamin D deficiency, unspecified: Secondary | ICD-10-CM | POA: Diagnosis not present

## 2022-10-04 DIAGNOSIS — F4320 Adjustment disorder, unspecified: Secondary | ICD-10-CM | POA: Diagnosis not present

## 2022-10-04 DIAGNOSIS — N179 Acute kidney failure, unspecified: Secondary | ICD-10-CM | POA: Diagnosis not present

## 2022-10-04 DIAGNOSIS — F609 Personality disorder, unspecified: Secondary | ICD-10-CM | POA: Diagnosis not present

## 2022-10-04 DIAGNOSIS — M6281 Muscle weakness (generalized): Secondary | ICD-10-CM | POA: Diagnosis not present

## 2022-10-04 DIAGNOSIS — E119 Type 2 diabetes mellitus without complications: Secondary | ICD-10-CM | POA: Diagnosis not present

## 2022-10-04 DIAGNOSIS — M069 Rheumatoid arthritis, unspecified: Secondary | ICD-10-CM | POA: Diagnosis not present

## 2022-10-04 DIAGNOSIS — Z79899 Other long term (current) drug therapy: Secondary | ICD-10-CM | POA: Diagnosis not present

## 2022-10-04 DIAGNOSIS — I129 Hypertensive chronic kidney disease with stage 1 through stage 4 chronic kidney disease, or unspecified chronic kidney disease: Secondary | ICD-10-CM | POA: Diagnosis not present

## 2022-10-04 DIAGNOSIS — E279 Disorder of adrenal gland, unspecified: Secondary | ICD-10-CM | POA: Diagnosis not present

## 2022-10-04 DIAGNOSIS — R229 Localized swelling, mass and lump, unspecified: Secondary | ICD-10-CM | POA: Diagnosis not present

## 2022-10-04 DIAGNOSIS — M76891 Other specified enthesopathies of right lower limb, excluding foot: Secondary | ICD-10-CM | POA: Diagnosis not present

## 2022-10-04 DIAGNOSIS — Z794 Long term (current) use of insulin: Secondary | ICD-10-CM | POA: Diagnosis not present

## 2022-10-04 DIAGNOSIS — M25511 Pain in right shoulder: Secondary | ICD-10-CM | POA: Diagnosis not present

## 2022-10-04 DIAGNOSIS — D649 Anemia, unspecified: Secondary | ICD-10-CM | POA: Diagnosis not present

## 2022-10-04 DIAGNOSIS — E785 Hyperlipidemia, unspecified: Secondary | ICD-10-CM | POA: Diagnosis not present

## 2022-10-04 DIAGNOSIS — S76111S Strain of right quadriceps muscle, fascia and tendon, sequela: Secondary | ICD-10-CM | POA: Diagnosis not present

## 2022-10-04 DIAGNOSIS — S83411A Sprain of medial collateral ligament of right knee, initial encounter: Secondary | ICD-10-CM | POA: Diagnosis not present

## 2022-10-04 DIAGNOSIS — F32A Depression, unspecified: Secondary | ICD-10-CM | POA: Diagnosis not present

## 2022-10-04 DIAGNOSIS — E1159 Type 2 diabetes mellitus with other circulatory complications: Secondary | ICD-10-CM | POA: Diagnosis not present

## 2022-10-04 DIAGNOSIS — M79631 Pain in right forearm: Secondary | ICD-10-CM | POA: Diagnosis not present

## 2022-10-04 DIAGNOSIS — D6489 Other specified anemias: Secondary | ICD-10-CM | POA: Diagnosis not present

## 2022-10-04 DIAGNOSIS — E1122 Type 2 diabetes mellitus with diabetic chronic kidney disease: Secondary | ICD-10-CM | POA: Diagnosis not present

## 2022-10-04 DIAGNOSIS — I10 Essential (primary) hypertension: Secondary | ICD-10-CM | POA: Diagnosis not present

## 2022-10-04 DIAGNOSIS — R52 Pain, unspecified: Secondary | ICD-10-CM | POA: Diagnosis not present

## 2022-10-04 DIAGNOSIS — S76119D Strain of unspecified quadriceps muscle, fascia and tendon, subsequent encounter: Secondary | ICD-10-CM | POA: Diagnosis not present

## 2022-10-04 DIAGNOSIS — S76111D Strain of right quadriceps muscle, fascia and tendon, subsequent encounter: Secondary | ICD-10-CM | POA: Diagnosis not present

## 2022-10-04 DIAGNOSIS — E875 Hyperkalemia: Secondary | ICD-10-CM | POA: Diagnosis not present

## 2022-10-04 DIAGNOSIS — N183 Chronic kidney disease, stage 3 unspecified: Secondary | ICD-10-CM | POA: Diagnosis not present

## 2022-10-04 DIAGNOSIS — Z7401 Bed confinement status: Secondary | ICD-10-CM | POA: Diagnosis not present

## 2022-10-04 DIAGNOSIS — M25531 Pain in right wrist: Secondary | ICD-10-CM | POA: Diagnosis not present

## 2022-10-04 DIAGNOSIS — R2689 Other abnormalities of gait and mobility: Secondary | ICD-10-CM | POA: Diagnosis not present

## 2022-10-04 DIAGNOSIS — R531 Weakness: Secondary | ICD-10-CM | POA: Diagnosis not present

## 2022-10-04 DIAGNOSIS — F4310 Post-traumatic stress disorder, unspecified: Secondary | ICD-10-CM | POA: Diagnosis not present

## 2022-10-04 DIAGNOSIS — S76119A Strain of unspecified quadriceps muscle, fascia and tendon, initial encounter: Secondary | ICD-10-CM | POA: Diagnosis not present

## 2022-10-04 DIAGNOSIS — N1831 Chronic kidney disease, stage 3a: Secondary | ICD-10-CM | POA: Diagnosis present

## 2022-10-04 DIAGNOSIS — R5381 Other malaise: Secondary | ICD-10-CM | POA: Diagnosis not present

## 2022-10-04 DIAGNOSIS — D631 Anemia in chronic kidney disease: Secondary | ICD-10-CM | POA: Diagnosis present

## 2022-10-04 DIAGNOSIS — R41841 Cognitive communication deficit: Secondary | ICD-10-CM | POA: Diagnosis not present

## 2022-10-04 DIAGNOSIS — Z9189 Other specified personal risk factors, not elsewhere classified: Secondary | ICD-10-CM | POA: Diagnosis not present

## 2022-10-04 DIAGNOSIS — D62 Acute posthemorrhagic anemia: Secondary | ICD-10-CM | POA: Diagnosis not present

## 2022-10-04 DIAGNOSIS — I152 Hypertension secondary to endocrine disorders: Secondary | ICD-10-CM | POA: Diagnosis not present

## 2022-10-04 DIAGNOSIS — N189 Chronic kidney disease, unspecified: Secondary | ICD-10-CM | POA: Diagnosis not present

## 2022-10-04 DIAGNOSIS — S76111A Strain of right quadriceps muscle, fascia and tendon, initial encounter: Secondary | ICD-10-CM | POA: Diagnosis not present

## 2022-10-04 LAB — BASIC METABOLIC PANEL
Anion gap: 4 — ABNORMAL LOW (ref 5–15)
BUN: 35 mg/dL — ABNORMAL HIGH (ref 8–23)
CO2: 24 mmol/L (ref 22–32)
Calcium: 8.4 mg/dL — ABNORMAL LOW (ref 8.9–10.3)
Chloride: 107 mmol/L (ref 98–111)
Creatinine, Ser: 1.39 mg/dL — ABNORMAL HIGH (ref 0.44–1.00)
GFR, Estimated: 42 mL/min — ABNORMAL LOW (ref >60)
Glucose, Bld: 143 mg/dL — ABNORMAL HIGH (ref 70–99)
Potassium: 4.8 mmol/L (ref 3.5–5.1)
Sodium: 135 mmol/L (ref 135–145)

## 2022-10-04 LAB — GLUCOSE, CAPILLARY
Glucose-Capillary: 131 mg/dL — ABNORMAL HIGH (ref 70–99)
Glucose-Capillary: 187 mg/dL — ABNORMAL HIGH (ref 70–99)
Glucose-Capillary: 281 mg/dL — ABNORMAL HIGH (ref 70–99)

## 2022-10-04 MED ORDER — GABAPENTIN 100 MG PO CAPS: ORAL_CAPSULE | ORAL | 0 refills | Status: DC

## 2022-10-04 MED ORDER — APIXABAN 5 MG PO TABS: 5.0000 mg | ORAL_TABLET | Freq: Two times a day (BID) | ORAL | 0 refills | Status: DC

## 2022-10-04 NOTE — Discharge Summary (Signed)
Patient ID: Jocelyn Kaufman MRN: [id number] DOB/AGE: Sep 05, 1956 66 y.o.  Admit date: 09/26/2022 Discharge date: 10/04/2022  Admission Diagnoses:Recurrent rupture of the quadriceps tendon   Discharge Diagnoses:  Principal Problem:   Quadriceps muscle rupture, right, sequela Active Problems:   Diabetes mellitus ([place])   Essential hypertension   Mixed hyperlipidemia   Rheumatoid arthritis ([place])   Chronic kidney disease, stage 3a (HCC)   Hypothyroid   Morbid obesity ([place])   Pulmonary embolus (HCC)   ABLA (acute blood loss anemia)   Past Medical History:  Diagnosis Date   Adrenal adenoma    Anemia    Angio-edema    Asthma    Atrial fibrillation (HCC)    Chronic kidney disease    stage II   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 Tolerated procedure well.  Was kept for monitoring for pain control, medical monitoring postop, PT/OT and discharge planning. PT/OT recommending SNF. She was kept in the hospital since SNF could not accept the patient on Friday or over the weekend. Over the weekend, her blood pressure remained elevated despite three anti-hypertensives. The hospitalist service was consulted for help managing the patient with her multiple co-morbidities. She also was noted to have a decrease in her Hgb. She received transfusion of pRBCs. Her WBC began to trend upwards. Infectious work up was negative. Hgb started to  increase and WBC began to trend down. Her creatinine then started to increase from baseline so she was kept an additional night for monitoring. Today her creatinine is increasing down. She was found to be medically stable for discharge on 10/04/2022 with recommendations of repeating BMP in 3-4 days. Patient was instructed on specific activity restrictions and all questions were answered.  Consults: PT/OT, hospitalist services  Significant Diagnostic Studies: No additional pertinent studies  Treatments: Surgery  Discharge Exam: General exam: Appears calm and comfortable  Respiratory system: Clear to auscultation. Respiratory effort normal. Cardiovascular system: S1 & S2 heard, RRR. No JVD, murmurs, rubs, gallops or clicks. No pedal edema. Gastrointestinal system: Abdomen is nondistended, soft and nontender. No organomegaly or masses felt. Normal bowel sounds heard. Central nervous system: Alert and oriented. No focal neurological deficits. Skin: No rashes, lesions or ulcers.  Psychiatry: Judgement and insight appear normal. Mood & affect appropriate.  RLE: splint CDI, +EHL though remainder of motor difficult to test due to splint, sensation intact distally with warm well perfused foot, no pain w passive stretch, warm well perfused extremity  Disposition: Discharge disposition: 03-Skilled Nursing Facility      SNF  Discharge Instructions     Call MD for:  redness, tenderness, or signs of infection (pain, swelling, redness, odor or green/yellow discharge around incision site)   Complete by: As directed    Call MD for:  redness, tenderness, or signs of infection (pain, swelling, redness, odor or green/yellow discharge around incision site)   Complete by:  As directed    Call MD for:  severe uncontrolled pain   Complete by: As directed    Call MD for:  severe uncontrolled pain   Complete by: As directed    Call MD for:  temperature >100.4   Complete by: As directed    Call MD for:   temperature >100.4   Complete by: As directed    Diet - low sodium heart healthy   Complete by: As directed    Diet - low sodium heart healthy   Complete by: As directed    Leave dressing on - Keep it clean, dry, and intact until clinic visit   Complete by: As directed       Allergies as of 10/04/2022       Reactions   Semaglutide(0.25 Or 0.60m-dos) Other (See Comments)   Causes cataracts requiring surgery and impairing vision   Shellfish-derived Products Anaphylaxis, Swelling, Other (See Comments)   Patient avoids   Empagliflozin Itching, Swelling, Other (See Comments)   UTI sx's and yeast infections   Rho (d) Immune Globulin     Not available   Tramadol Hcl Other (See Comments)   hallucinations   Canagliflozin Other (See Comments)   Urinary incontinence   Latex Rash   Liraglutide Itching   Other reaction(s): itching all over   Minoxidil Rash   skin irritation   Omeprazole Rash   Percocet [oxycodone-acetaminophen] Nausea And Vomiting        Medication List     STOP taking these medications    acetaminophen 500 MG tablet Commonly known as: TYLENOL   Eliquis DVT/PE Starter Pack Generic drug: Apixaban Starter Pack (1530mand 30m61mReplaced by: apixaban 5 MG Tabs tablet   enoxaparin 150 MG/ML injection Commonly known as: LOVENOX   HYDROcodone-acetaminophen 5-325 MG tablet Commonly known as: Norco   metaxalone 800 MG tablet Commonly known as: SKELAXIN       TAKE these medications    Accu-Chek Softclix Lancet Dev Kit See admin instructions.   Accu-Chek Softclix Lancets lancets use to check blood sugar   albuterol 108 (90 Base) MCG/ACT inhaler Commonly known as: VENTOLIN HFA Inhale two puffs every four to six hours as needed for cough or wheeze. What changed:  how much to take how to take this when to take this reasons to take this additional instructions   amLODipine 10 MG tablet Commonly known as: NORVASC Take 1 tablet (10 mg total) by mouth  daily.   apixaban 5 MG Tabs tablet Commonly known as: ELIQUIS Take 1 tablet (5 mg total) by mouth 2 (two) times daily. Replaces: Eliquis DVT/PE Starter Pack   atorvastatin 40 MG tablet Commonly known as: LIPITOR Take 1 tablet (40 mg total) by mouth daily at 6 PM. What changed: when to take this   B-D ULTRAFINE III SHORT PEN 31G X 8 MM Misc Generic drug: Insulin Pen Needle SMARTSIG:1 Each SUB-Q Daily   CENTRUM SILVER PO Take 1 tablet by mouth daily with breakfast.   CHELATED IRON PO Take 18 mg by mouth daily.   ciclopirox 8 % solution Commonly known as: PENLAC Apply topically at bedtime. Apply over nail and surrounding skin. Apply daily over previous coat. After seven (7) days, may remove with alcohol and continue cycle.   cyanocobalamin 1000 MCG tablet Commonly known as: VITAMIN B12 Take 1,000 mcg by mouth daily.   EPINEPHrine 0.3 mg/0.3 mL Soaj injection Commonly known as: EpiPen 2-Pak Use as directed for life-threatening allergic reaction.   escitalopram  20 MG tablet Commonly known as: LEXAPRO Take 20 mg by mouth daily.   Eylea 2 MG/0.05ML Soln Generic drug: Aflibercept 2 mg by Intravitreal route every 3 (three) months.   ezetimibe 10 MG tablet Commonly known as: Zetia Take 1 tablet (10 mg total) by mouth daily.   fexofenadine 180 MG tablet Commonly known as: Allegra Allergy Take 1 tablet (180 mg total) by mouth daily.   folic acid 1 MG tablet Commonly known as: FOLVITE Take 2 mg by mouth daily.   gabapentin 100 MG capsule Commonly known as: Neurontin Take 1 tablet (116m) in the morning, 1 tablet (100 mg) in the afternoon, take 3 tablets (3063m at bedtime   hydrALAZINE 50 MG tablet Commonly known as: APRESOLINE Take 50 mg by mouth 3 (three) times daily.   hydrocortisone cream 1 % Apply 1 Application topically 2 (two) times daily as needed for itching.   insulin aspart 100 UNIT/ML FlexPen Commonly known as: NOVOLOG Inject 20 Units into the skin  See admin instructions. Inject 20 units into the skin two to three times a day before meals   ipratropium 0.06 % nasal spray Commonly known as: ATROVENT Place 1-2 sprays each nostril as needed up to 3 times a day What changed:  how much to take how to take this when to take this reasons to take this additional instructions   irbesartan 300 MG tablet Commonly known as: AVAPRO Take 1 tablet (300 mg total) by mouth daily.   latanoprost 0.005 % ophthalmic solution Commonly known as: XALATAN Place 1 drop into both eyes at bedtime.   lidocaine 4 % cream Commonly known as: LMX Apply 1 Application topically as needed (pain).   methocarbamol 750 MG tablet Commonly known as: Robaxin-750 Take 1 tablet (750 mg total) by mouth every 8 (eight) hours as needed for muscle spasms.   methotrexate (PF) 50 MG/2ML injection Inject 20 mg into the muscle every Wednesday. 0.8 ml   metoprolol succinate 25 MG 24 hr tablet Commonly known as: TOPROL-XL Take 1 tablet (25 mg total) by mouth daily. May take additional half tablet as needed once daily for palpitations. What changed:  how much to take when to take this additional instructions   oxyCODONE 5 MG immediate release tablet Commonly known as: Roxicodone Take 1-2 tablets (5-10 mg total) by mouth every 6 (six) hours as needed for severe pain.   predniSONE 10 MG tablet Commonly known as: DELTASONE Take 10 mg by mouth daily.   Retacrit 2000 UNIT/ML injection Generic drug: epoetin alfa-epbx See admin instructions.   sulfaSALAzine 500 MG tablet Commonly known as: AZULFIDINE Take 3 tablets (1,500 mg total) by mouth 2 (two) times daily.   Synthroid 125 MCG tablet Generic drug: levothyroxine Take 125 mcg by mouth every Monday, Tuesday, Wednesday, Thursday, and Friday.   Toujeo Max SoloStar 300 UNIT/ML Solostar Pen Generic drug: insulin glargine (2 Unit Dial) Inject 46 Units into the skin daily. What changed:  how much to take when to  take this   triamcinolone cream 0.1 % Commonly known as: KENALOG Apply 1 Application topically 3 (three) times daily as needed (for itching- affected areas).   Trulicity 1.5 MGQA/8.3MHopn Generic drug: Dulaglutide Inject 1.5 mg into the skin every Tuesday.   Vitamin D3 125 MCG (5000 UT) Caps Take 5,000 Units by mouth daily.               Discharge Care Instructions  (From admission, onward)  Start     Ordered   10/04/22 0000  Leave dressing on - Keep it clean, dry, and intact until clinic visit        10/04/22 1159             Contact information for follow-up providers     Hiram Gash, MD. Call.   Specialty: Orthopedic Surgery Why: For suture removal in 2 weeks Contact information: [address] [place] [address] [phone]              Contact information for after-discharge care     Destination     [facility] Preferred SNF .   Service: Skilled Nursing Contact information: [place] Emelle Windsor, PA-C 10/04/2022

## 2022-10-04 NOTE — Progress Notes (Signed)
   ORTHOPAEDIC PROGRESS NOTE  s/p Procedure(s): REVISION REPAIR QUADRICEP TENDON LIGAMENT REPAIR/knee on 09/26/2022 with Dr. Griffin Basil  SUBJECTIVE: Patient slept better last night. She did not require as much pain medicine. Resting comfortably in hospital bed. She notes the nutritional drink made her stomach upset. I will discontinue this. She had a large bowel movement yesterday.   No chest pain. No SOB. No nausea/vomiting. No other complaints.  OBJECTIVE: PE: General: resting comfortably in hospital bed, NAD RLE: splint CDI, incision CDI, +EHL though remainder of motor difficult to test due to splint, sensation intact distally with warm well perfused foot, no pain w passive stretch, warm well perfused extremity  Image taken 10/02/22  Vitals:   10/03/22 1945 10/04/22 0433  BP: (!) 115/49 131/63  Pulse: 72 72  Resp: 20 20  Temp: 98.7 F (37.1 C)   SpO2: 98% 100%     ASSESSMENT: Jocelyn Kaufman is a 66 y.o. female  POD#8  PLAN: Weightbearing: TDWB in splint Insicional and dressing care: Dressings left intact until follow-up. A cast will be placed at her first post-op visit in the office.  Orthopedic device(s): Splint Showering: Hold for now VTE prophylaxis: Eliquis Pain control: PRN pain medications, minimize narcotics as able. Discontinued Dilaudid.  Tylenol 1000 mg every 8 hours Robaxin 750 mg every 8 hours Gabapentin 100 mg qAM, 100 mg midday, 300 mg qPM Oxycodone 5-10 mg every 6 hours only as needed for SEVERE PAIN  - we discussed weaning off oxycodone as well 5. DonJoy IceMan   Continue to wean from narcotics.   Acute on chronic anemia: Hgb trending up yesterday Leukocytosis: WBC trending down  - There are no signs of infection at patient's surgical site. Incision is healing well. New dressing placed yesterday.  - Appreciate hospitalist team help in managing this patient.  - Hospitalist team has discontinued antibiotics, she is afebrile CKD: Due to increase in her  creatinine levels from baseline, hospitalist team recommending continued monitoring. Patient requested I call her nephrologist, [name]. I have called [name] office [phone] and left a message for her team to call me back so I can update them on the patient's status. Recheck BMP this AM. Creatinine improving  Follow - up plan: 2 weeks in office Dispo: SNF. PT/OT recommending SNF.  Authorization for SNF has expired. TOC following. New authorization has been sent.   Appreciate hospitalist team for helping manage patient's co-morbidities. Patient's Hgb is trending up and WBC trending down. However, her creatinine was higher yesterday than baseline. New labs ordered this AM. Okay for discharge when she is cleared medically. Per hospitalist team, patient is medically stable for discharge with recommendations to check BMP in 3-4 days. New authorization for SNF was sent yesterday.   Contact information:  Weekdays 8-5 Dr. Ophelia Charter, Jocelyn Chapel PA-C, After hours and holidays please check [email or web address] for group call information for [facility]   Jocelyn Chapel, PA-C 10/04/2022

## 2022-10-04 NOTE — Plan of Care (Signed)

## 2022-10-04 NOTE — Progress Notes (Signed)
PROGRESS NOTE    Jocelyn Kaufman  JHE:[id number] DOB: [birth year] DOA: 09/26/2022 PCP: Deland Pretty, MD   Brief Narrative: 66 year old with past medical history significant for depression, diabetes, hypertension, hyperlipidemia, rheumatoid arthritis, A-fib on Eliquis, hypothyroidism who presented on 11/28 with right quad tendon rupture with instability, underwent revision quadriceps repair with allograft as well as medial and lateral retinacular repair.  She has been noted to have systolic blood pressure 185-631.  We were consulted on 12/3 for management of blood pressure and chronic medical problems.     Assessment & Plan:   Principal Problem:   Quadriceps muscle rupture, right, sequela Active Problems:   Diabetes mellitus (HCC)   Essential hypertension   Mixed hyperlipidemia   Rheumatoid arthritis (HCC)   Chronic kidney disease, stage 3a (HCC)   Hypothyroid   Morbid obesity (HCC)   Pulmonary embolus (HCC)   ABLA (acute blood loss anemia)   1-Quadricept  muscle rupture,Right  -Status postrepair -Pain management per Ortho  2-Acute blood loss anemia Anemia of chronic disease Hemoglobin baseline 9-10 She follows with Dr. Sonny Dandy for Retacrit.  Received  One unit PRBC> 12/04--hb remain same 7.3, she received another blood transfusion on 10/02/2022.  Will repeat CBC per patient's request. Continue with  iron.  Patient received a dose of Retacrit 1/04.   3-HTN;  Blood pressure very well-controlled, continue hydralazine 100 TID, due to AKI, will hold Avapro all can consider discontinuing altogether because her blood pressure is very well-controlled while this is being on hold.  Continue metoprolol and Norvasc.  4-History of Pulmonary embolism: Continue with Eliquis  Leukocytosis;  Chest x ray negative for PNA UA negative,. Follow urine culture.  Denies diarrhea, abdominal pain.  Surgery will evaluate surgical site.  Despite low-grade fever of 100.6 at around 1 PM on  10/02/2022, started on Ancef.  She has remained afebrile after that.  I will discontinue antibiotics, no signs of infection.  Morbid obesity: Lifestyle modifications  Hypothyroidism: Continue with Synthroid  AKI CKD stage IIIa: Creatinine slightly elevated than her baseline however it has improved somewhat compared to yesterday, continue to hold ARB may consider discontinuing Avapro.  Patient can be discharged with recommendations to repeat BMP in 3 to 4 days.  RA;: Continue with chronic prednisone and methotrexate  Hyperlipidemia: Continue with atorvastatin and Zetia  Diabetes type 2: Blood sugar very well-controlled, resume home medications.  Estimated body mass index is 40.56 kg/m as calculated from the following:   Height as of this encounter: 5' 4.5" (1.638 m).   Weight as of this encounter: 108.9 kg.   DVT prophylaxis: Eliquis Code Status: Full code Family Communication: care discussed with patient Disposition Plan:   Status is: Inpatient Remains inpatient appropriate because: Will be discharged at the discretion of primary service/orthopedics.   Subjective: Patient seen and examined.  She has no complaints.  Several questions were answered again today.  She is medically stable for discharge.  Objective: Vitals:   10/03/22 1424 10/03/22 1945 10/04/22 0433 10/04/22 0816  BP: (!) 121/47 (!) 115/49 131/63 135/63  Pulse: 74 72 72 82  Resp: '18 20 20 16  '$ Temp: 99.3 F (37.4 C) 98.7 F (37.1 C)  98.7 F (37.1 C)  TempSrc: Oral Oral    SpO2: 96% 98% 100% 100%  Weight:      Height:        Intake/Output Summary (Last 24 hours) at 10/04/2022 0905 Last data filed at 10/04/2022 0819 Gross per 24 hour  Intake 0 ml  Output 2950 ml  Net -2950 ml    Filed Weights   09/26/22 1303  Weight: 108.9 kg    Examination:  General exam: Appears calm and comfortable  Respiratory system: Clear to auscultation. Respiratory effort normal. Cardiovascular system: S1 & S2 heard,  RRR. No JVD, murmurs, rubs, gallops or clicks. No pedal edema. Gastrointestinal system: Abdomen is nondistended, soft and nontender. No organomegaly or masses felt. Normal bowel sounds heard. Central nervous system: Alert and oriented. No focal neurological deficits. Skin: No rashes, lesions or ulcers.  Psychiatry: Judgement and insight appear normal. Mood & affect appropriate.   Data Reviewed: I have personally reviewed following labs and imaging studies  CBC: Recent Labs  Lab 09/28/22 0749 09/30/22 0826 10/01/22 0353 10/02/22 0408 10/02/22 2049 10/03/22 0842  WBC 7.2 11.5* 13.7* 17.8*  --  15.4*  NEUTROABS  --   --   --   --   --  12.8*  HGB 7.9* 7.3* 7.1* 7.3* 7.8* 7.9*  HCT 25.5* 23.3* 22.5* 22.2* 22.6* 23.7*  MCV 93.4 92.1 91.5 87.7  --  87.1  PLT 148* 181 187 162  --  829    Basic Metabolic Panel: Recent Labs  Lab 09/30/22 0826 10/01/22 0353 10/02/22 0408 10/03/22 0321 10/04/22 0715  NA  --  136 131* 134* 135  K  --  5.1 5.0 4.9 4.8  CL  --  102 100 98 107  CO2  --  '25 25 26 24  '$ GLUCOSE  --  123* 103* 134* 143*  BUN  --  '15 17 22 '$ 35*  CREATININE 1.10* 1.08* 1.05* 1.45* 1.39*  CALCIUM  --  8.9 8.5* 8.8* 8.4*    GFR: Estimated Creatinine Clearance: 48.5 mL/min (A) (by C-G formula based on SCr of 1.39 mg/dL (H)). Liver Function Tests: No results for input(s): "AST", "ALT", "ALKPHOS", "BILITOT", "PROT", "ALBUMIN" in the last 168 hours. No results for input(s): "LIPASE", "AMYLASE" in the last 168 hours. No results for input(s): "AMMONIA" in the last 168 hours. Coagulation Profile: No results for input(s): "INR", "PROTIME" in the last 168 hours. Cardiac Enzymes: No results for input(s): "CKTOTAL", "CKMB", "CKMBINDEX", "TROPONINI" in the last 168 hours. BNP (last 3 results) No results for input(s): "PROBNP" in the last 8760 hours. HbA1C: No results for input(s): "HGBA1C" in the last 72 hours. CBG: Recent Labs  Lab 10/03/22 0755 10/03/22 1215 10/03/22 1643  10/03/22 1947 10/04/22 0815  GLUCAP 123* 146* 134* 179* 131*    Lipid Profile: No results for input(s): "CHOL", "HDL", "LDLCALC", "TRIG", "CHOLHDL", "LDLDIRECT" in the last 72 hours. Thyroid Function Tests: No results for input(s): "TSH", "T4TOTAL", "FREET4", "T3FREE", "THYROIDAB" in the last 72 hours. Anemia Panel: No results for input(s): "VITAMINB12", "FOLATE", "FERRITIN", "TIBC", "IRON", "RETICCTPCT" in the last 72 hours. Sepsis Labs: No results for input(s): "PROCALCITON", "LATICACIDVEN" in the last 168 hours.  Recent Results (from the past 240 hour(s))  Urine Culture     Status: Abnormal   Collection Time: 10/01/22 10:01 AM   Specimen: Urine, Clean Catch  Result Value Ref Range Status   Specimen Description URINE, CLEAN CATCH  Final   Special Requests   Final    NONE Performed at [facility], [address], [place], [place] [address]    Culture 30,000 COLONIES/mL STAPHYLOCOCCUS EPIDERMIDIS (A)  Final   Report Status 10/03/2022 FINAL  Final   Organism ID, Bacteria STAPHYLOCOCCUS EPIDERMIDIS (A)  Final      Susceptibility   Staphylococcus epidermidis - MIC*  CIPROFLOXACIN <=0.5 SENSITIVE Sensitive     GENTAMICIN <=0.5 SENSITIVE Sensitive     NITROFURANTOIN <=16 SENSITIVE Sensitive     OXACILLIN >=4 RESISTANT Resistant     TETRACYCLINE 2 SENSITIVE Sensitive     VANCOMYCIN 1 SENSITIVE Sensitive     TRIMETH/SULFA <=10 SENSITIVE Sensitive     CLINDAMYCIN RESISTANT Resistant     RIFAMPIN <=0.5 SENSITIVE Sensitive     Inducible Clindamycin POSITIVE Resistant     * 30,000 COLONIES/mL STAPHYLOCOCCUS EPIDERMIDIS  Culture, blood (single) w Reflex to ID Panel     Status: None (Preliminary result)   Collection Time: 10/02/22  2:02 PM   Specimen: BLOOD  Result Value Ref Range Status   Specimen Description BLOOD BLOOD LEFT ARM  Final   Special Requests   Final    BOTTLES DRAWN AEROBIC AND ANAEROBIC Blood Culture adequate volume   Culture   Final    NO GROWTH 2  DAYS Performed at [facility], [address] [place], [place] [address]    Report Status PENDING  Incomplete         Radiology Studies: No results found.      Scheduled Meds:  sodium chloride   Intravenous Once   acetaminophen  1,000 mg Oral Q8H   amLODipine  10 mg Oral Daily   apixaban  5 mg Oral BID   atorvastatin  40 mg Oral Daily   cholecalciferol  5,000 Units Oral Daily   docusate sodium  100 mg Oral BID   escitalopram  20 mg Oral Daily   ezetimibe  10 mg Oral Daily   ferrous sulfate  325 mg Oral Q breakfast   folic acid  2 mg Oral Daily   gabapentin  100 mg Oral BID   gabapentin  300 mg Oral QHS   hydrALAZINE  100 mg Oral TID   insulin aspart  0-15 Units Subcutaneous TID WC   insulin aspart  4 Units Subcutaneous TID WC   insulin glargine-yfgn  42 Units Subcutaneous QHS   latanoprost  1 drop Both Eyes QHS   levothyroxine  125 mcg Oral Once per day on Mon Tue Wed Thu Fri   loratadine  10 mg Oral Daily   methocarbamol  750 mg Oral QID   metoprolol succinate  25 mg Oral Daily   predniSONE  5 mg Oral Daily   sulfaSALAzine  1,500 mg Oral BID   Continuous Infusions:    LOS: 2 days    Darliss Cheney, MD [facility]   If 7PM-7AM, please contact night-coverage [email or web address]  10/04/2022, 9:05 AM

## 2022-10-04 NOTE — Progress Notes (Signed)
Report given to Darlene at [facility].  All questions answered.  She accepted patient.

## 2022-10-04 NOTE — TOC Transition Note (Addendum)
Transition of Care [facility]) - CM/SW Discharge Note   Patient Details  Name: Jocelyn Kaufman MRN: [id number] Date of Birth: 1956-08-20  Transition of Care [facility]) CM/SW Contact:  Joanne Chars, LCSW Phone Number: 10/04/2022, 12:17 PM   Clinical Narrative:   Pt discharging to [facility], room 807P.  RN call report to [phone].      Final next level of care: Skilled Nursing Facility Barriers to Discharge: Barriers Resolved   Patient Goals and CMS Choice Patient states their goals for this hospitalization and ongoing recovery are:: independence CMS Medicare.gov Compare Post Acute Care list provided to:: Patient Choice offered to / list presented to : Patient  Discharge Placement              Patient chooses bed at: [facility] Patient to be transferred to facility by: [place] Name of family member notified: left message with sister Silva Bandy Patient and family notified of of transfer: 10/04/22  Discharge Plan and Services In-house Referral: Clinical Social Work   Post Acute Care Choice: [place]                               Social Determinants of Health (SDOH) Interventions     Readmission Risk Interventions     No data to display

## 2022-10-04 NOTE — TOC Progression Note (Addendum)
Transition of Care [facility]) - Progression Note    Patient Details  Name: Jocelyn Kaufman MRN: [id number] Date of Birth: 07-13-1956  [facility] CM/SW Contact  Joanne Chars, LCSW Phone Number: 10/04/2022, 10:29 AM  Clinical Narrative:   Auth approved in [place]: [id number], 5 days: 12/7-12/11.    Confirmed with [facility] that they can receive pt today.    Expected Discharge Plan: [place] Barriers to Discharge: Continued Medical Work up, SNF Pending bed offer  Expected Discharge Plan and Services Expected Discharge Plan: [place] In-house Referral: Clinical Social Work   Post Acute Care Choice: [place] Living arrangements for the past 2 months: Single Family Home Expected Discharge Date: 09/28/22                                     Social Determinants of Health (SDOH) Interventions    Readmission Risk Interventions     No data to display

## 2022-10-05 ENCOUNTER — Ambulatory Visit: Payer: Medicare PPO | Admitting: Allergy

## 2022-10-05 DIAGNOSIS — I152 Hypertension secondary to endocrine disorders: Secondary | ICD-10-CM | POA: Diagnosis not present

## 2022-10-05 DIAGNOSIS — I129 Hypertensive chronic kidney disease with stage 1 through stage 4 chronic kidney disease, or unspecified chronic kidney disease: Secondary | ICD-10-CM | POA: Diagnosis not present

## 2022-10-05 DIAGNOSIS — E039 Hypothyroidism, unspecified: Secondary | ICD-10-CM | POA: Diagnosis not present

## 2022-10-05 DIAGNOSIS — Z794 Long term (current) use of insulin: Secondary | ICD-10-CM | POA: Diagnosis not present

## 2022-10-05 DIAGNOSIS — E1122 Type 2 diabetes mellitus with diabetic chronic kidney disease: Secondary | ICD-10-CM | POA: Diagnosis not present

## 2022-10-05 DIAGNOSIS — E240 Pituitary-dependent Cushing's disease: Secondary | ICD-10-CM | POA: Diagnosis not present

## 2022-10-05 DIAGNOSIS — N183 Chronic kidney disease, stage 3 unspecified: Secondary | ICD-10-CM | POA: Diagnosis not present

## 2022-10-05 DIAGNOSIS — S76119A Strain of unspecified quadriceps muscle, fascia and tendon, initial encounter: Secondary | ICD-10-CM | POA: Diagnosis not present

## 2022-10-07 LAB — CULTURE, BLOOD (SINGLE)
Culture: NO GROWTH
Special Requests: ADEQUATE

## 2022-10-08 DIAGNOSIS — N183 Chronic kidney disease, stage 3 unspecified: Secondary | ICD-10-CM | POA: Diagnosis not present

## 2022-10-08 DIAGNOSIS — M069 Rheumatoid arthritis, unspecified: Secondary | ICD-10-CM | POA: Diagnosis not present

## 2022-10-08 DIAGNOSIS — I2699 Other pulmonary embolism without acute cor pulmonale: Secondary | ICD-10-CM | POA: Diagnosis not present

## 2022-10-08 DIAGNOSIS — S76111A Strain of right quadriceps muscle, fascia and tendon, initial encounter: Secondary | ICD-10-CM | POA: Diagnosis not present

## 2022-10-08 DIAGNOSIS — I152 Hypertension secondary to endocrine disorders: Secondary | ICD-10-CM | POA: Diagnosis not present

## 2022-10-08 DIAGNOSIS — I129 Hypertensive chronic kidney disease with stage 1 through stage 4 chronic kidney disease, or unspecified chronic kidney disease: Secondary | ICD-10-CM | POA: Diagnosis not present

## 2022-10-08 DIAGNOSIS — R52 Pain, unspecified: Secondary | ICD-10-CM | POA: Diagnosis not present

## 2022-10-08 DIAGNOSIS — F32A Depression, unspecified: Secondary | ICD-10-CM | POA: Diagnosis not present

## 2022-10-08 DIAGNOSIS — N179 Acute kidney failure, unspecified: Secondary | ICD-10-CM | POA: Diagnosis not present

## 2022-10-08 DIAGNOSIS — R5381 Other malaise: Secondary | ICD-10-CM | POA: Diagnosis not present

## 2022-10-08 DIAGNOSIS — M797 Fibromyalgia: Secondary | ICD-10-CM | POA: Diagnosis not present

## 2022-10-08 DIAGNOSIS — E119 Type 2 diabetes mellitus without complications: Secondary | ICD-10-CM | POA: Diagnosis not present

## 2022-10-08 DIAGNOSIS — Z794 Long term (current) use of insulin: Secondary | ICD-10-CM | POA: Diagnosis not present

## 2022-10-08 DIAGNOSIS — S76111D Strain of right quadriceps muscle, fascia and tendon, subsequent encounter: Secondary | ICD-10-CM | POA: Diagnosis not present

## 2022-10-08 DIAGNOSIS — E1122 Type 2 diabetes mellitus with diabetic chronic kidney disease: Secondary | ICD-10-CM | POA: Diagnosis not present

## 2022-10-08 DIAGNOSIS — F419 Anxiety disorder, unspecified: Secondary | ICD-10-CM | POA: Diagnosis not present

## 2022-10-08 DIAGNOSIS — R2681 Unsteadiness on feet: Secondary | ICD-10-CM | POA: Diagnosis not present

## 2022-10-09 DIAGNOSIS — E875 Hyperkalemia: Secondary | ICD-10-CM | POA: Diagnosis not present

## 2022-10-09 DIAGNOSIS — E1122 Type 2 diabetes mellitus with diabetic chronic kidney disease: Secondary | ICD-10-CM | POA: Diagnosis not present

## 2022-10-09 DIAGNOSIS — Z794 Long term (current) use of insulin: Secondary | ICD-10-CM | POA: Diagnosis not present

## 2022-10-09 DIAGNOSIS — S76119D Strain of unspecified quadriceps muscle, fascia and tendon, subsequent encounter: Secondary | ICD-10-CM | POA: Diagnosis not present

## 2022-10-09 DIAGNOSIS — M069 Rheumatoid arthritis, unspecified: Secondary | ICD-10-CM | POA: Diagnosis not present

## 2022-10-09 DIAGNOSIS — S83411A Sprain of medial collateral ligament of right knee, initial encounter: Secondary | ICD-10-CM | POA: Diagnosis not present

## 2022-10-09 DIAGNOSIS — N183 Chronic kidney disease, stage 3 unspecified: Secondary | ICD-10-CM | POA: Diagnosis not present

## 2022-10-09 DIAGNOSIS — D62 Acute posthemorrhagic anemia: Secondary | ICD-10-CM | POA: Diagnosis not present

## 2022-10-09 DIAGNOSIS — F32A Depression, unspecified: Secondary | ICD-10-CM | POA: Diagnosis not present

## 2022-10-10 DIAGNOSIS — M797 Fibromyalgia: Secondary | ICD-10-CM | POA: Diagnosis not present

## 2022-10-10 DIAGNOSIS — R2681 Unsteadiness on feet: Secondary | ICD-10-CM | POA: Diagnosis not present

## 2022-10-10 DIAGNOSIS — R52 Pain, unspecified: Secondary | ICD-10-CM | POA: Diagnosis not present

## 2022-10-10 DIAGNOSIS — F419 Anxiety disorder, unspecified: Secondary | ICD-10-CM | POA: Diagnosis not present

## 2022-10-10 DIAGNOSIS — E119 Type 2 diabetes mellitus without complications: Secondary | ICD-10-CM | POA: Diagnosis not present

## 2022-10-10 DIAGNOSIS — S76111A Strain of right quadriceps muscle, fascia and tendon, initial encounter: Secondary | ICD-10-CM | POA: Diagnosis not present

## 2022-10-10 DIAGNOSIS — I2699 Other pulmonary embolism without acute cor pulmonale: Secondary | ICD-10-CM | POA: Diagnosis not present

## 2022-10-10 DIAGNOSIS — R5381 Other malaise: Secondary | ICD-10-CM | POA: Diagnosis not present

## 2022-10-11 DIAGNOSIS — N183 Chronic kidney disease, stage 3 unspecified: Secondary | ICD-10-CM | POA: Diagnosis not present

## 2022-10-11 DIAGNOSIS — E875 Hyperkalemia: Secondary | ICD-10-CM | POA: Diagnosis not present

## 2022-10-11 DIAGNOSIS — I129 Hypertensive chronic kidney disease with stage 1 through stage 4 chronic kidney disease, or unspecified chronic kidney disease: Secondary | ICD-10-CM | POA: Diagnosis not present

## 2022-10-15 DIAGNOSIS — S76111A Strain of right quadriceps muscle, fascia and tendon, initial encounter: Secondary | ICD-10-CM | POA: Diagnosis not present

## 2022-10-15 DIAGNOSIS — E119 Type 2 diabetes mellitus without complications: Secondary | ICD-10-CM | POA: Diagnosis not present

## 2022-10-15 DIAGNOSIS — R52 Pain, unspecified: Secondary | ICD-10-CM | POA: Diagnosis not present

## 2022-10-15 DIAGNOSIS — M797 Fibromyalgia: Secondary | ICD-10-CM | POA: Diagnosis not present

## 2022-10-15 DIAGNOSIS — R5381 Other malaise: Secondary | ICD-10-CM | POA: Diagnosis not present

## 2022-10-15 DIAGNOSIS — F419 Anxiety disorder, unspecified: Secondary | ICD-10-CM | POA: Diagnosis not present

## 2022-10-15 DIAGNOSIS — R2681 Unsteadiness on feet: Secondary | ICD-10-CM | POA: Diagnosis not present

## 2022-10-15 DIAGNOSIS — I2699 Other pulmonary embolism without acute cor pulmonale: Secondary | ICD-10-CM | POA: Diagnosis not present

## 2022-10-16 DIAGNOSIS — I4891 Unspecified atrial fibrillation: Secondary | ICD-10-CM | POA: Diagnosis not present

## 2022-10-16 DIAGNOSIS — D62 Acute posthemorrhagic anemia: Secondary | ICD-10-CM | POA: Diagnosis not present

## 2022-10-29 NOTE — Progress Notes (Signed)
HEMATOLOGY-ONCOLOGY TELEPHONE VISIT PROGRESS NOTE  I connected with our patient on 11/01/22 at 10:30 AM EST by telephone and verified that I am speaking with the correct person using two identifiers.  I discussed the limitations, risks, security and privacy concerns of performing an evaluation and management service by telephone and the availability of in person appointments.  I also discussed with the patient that there may be a patient responsible charge related to this service. The patient expressed understanding and agreed to proceed.   History of Present Illness: Recent hospitalization for knee surgery and is currently in rehab.  Received 2 units of PRBC in the hospital.  Hemoglobin checked at rehab was 9 g.  She complains of feeling extremely fatigued.    REVIEW OF SYSTEMS:   Constitutional: Generalized fatigue and weakness All other systems were reviewed with the patient and are negative. Observations/Objective:     Assessment Plan:  Anemia in chronic kidney disease Patient has history of hypothyroidism as well as possible adrenal dysfunction issues for which [facility]   Previous blood work review: 1.  Erythropoietin: 24.2 2. SPEP: No M protein 3.  Haptoglobin: 146, LDH 281, absolute reticulocyte count 96.4, direct Coombs test negative 4.  B12 750 5.  Folic acid 09.2 6.  Ferritin 88, iron saturation 25 7. 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  (Retacrit injections for hemoglobin 10.5 or less) Lab review 03/23/2020: Erythropoietin 27.9, hemoglobin 9.6 02/02/2021: Hemoglobin 10.6 (given her severe symptoms continue with Aranesp) 06/22/2021: Hemoglobin 11.3  11/07/2021: Hemoglobin  11.1 11/21/2021: Hemoglobin 10.5 (no Retacrit today) 01/02/2022: Hemoglobin 10.7 (no Retacrit today): Last Retacrit was on 12/05/2021  02/01/2022: Hemoglobin 10.3: Proceeding to Retacrit today 05/24/2022: Hemoglobin 10.4: Retacrit being given today 08/31/2022: Hemoglobin 10.1 09/19/2022: Hemoglobin 11.2 (Retacrit given 08/31/2022)   She usually feels better when the hemoglobin is closer to 11 but because of insurance reasons we are only able to do Retacrit to hemoglobin of 10.5. Patient is going to undergo a major knee surgery and will be 6 weeks in rehab.  I gave her a prescription for labs to be done on    10/23/2022: Hemoglobin 9 g. We requested Retacrit injection.  Since she is in rehab she will have to come here and get the injection. Recheck labs and follow-up in 4 weeks   I discussed the assessment and treatment plan with the patient. The patient was provided an opportunity to ask questions and all were answered. The patient agreed with the plan and demonstrated an understanding of the instructions. The patient was advised to call back or seek an in-person evaluation if the symptoms worsen or if the condition fails to improve as anticipated.   I provided 12 minutes of non-face-to-face time during this encounter.  This includes time for charting and coordination of care   Harriette Ohara, MD

## 2022-11-01 ENCOUNTER — Encounter: Payer: Self-pay | Admitting: *Deleted

## 2022-11-01 ENCOUNTER — Inpatient Hospital Stay: Payer: Medicare PPO | Attending: Hematology and Oncology | Admitting: Hematology and Oncology

## 2022-11-01 DIAGNOSIS — D631 Anemia in chronic kidney disease: Secondary | ICD-10-CM | POA: Diagnosis not present

## 2022-11-01 DIAGNOSIS — N183 Chronic kidney disease, stage 3 unspecified: Secondary | ICD-10-CM | POA: Diagnosis not present

## 2022-11-01 DIAGNOSIS — E279 Disorder of adrenal gland, unspecified: Secondary | ICD-10-CM | POA: Insufficient documentation

## 2022-11-01 DIAGNOSIS — E039 Hypothyroidism, unspecified: Secondary | ICD-10-CM | POA: Insufficient documentation

## 2022-11-01 DIAGNOSIS — N1831 Chronic kidney disease, stage 3a: Secondary | ICD-10-CM | POA: Insufficient documentation

## 2022-11-01 NOTE — Assessment & Plan Note (Signed)
Patient has history of hypothyroidism as well as possible adrenal dysfunction issues for which she goes to [facility]   Previous blood work review: 1.  Erythropoietin: 24.2 2. SPEP: No M protein 3.  Haptoglobin: 146, LDH 281, absolute reticulocyte count 96.4, direct Coombs test negative 4.  B12 750 5.  Folic acid 40.7 6.  Ferritin 88, iron saturation 25 7. 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  (Retacrit injections for hemoglobin 10.5 or less) Lab review 03/23/2020: Erythropoietin 27.9, hemoglobin 9.6 02/02/2021: Hemoglobin 10.6 (given her severe symptoms continue with Aranesp) 06/22/2021: Hemoglobin 11.3  11/07/2021: Hemoglobin 11.1 11/21/2021: Hemoglobin 10.5 (no Retacrit today) 01/02/2022: Hemoglobin 10.7 (no Retacrit today): Last Retacrit was on 12/05/2021  02/01/2022: Hemoglobin 10.3: Proceeding to Retacrit today 05/24/2022: Hemoglobin 10.4: Retacrit being given today 08/31/2022: Hemoglobin 10.1 09/19/2022: Hemoglobin 11.2 (Retacrit given 08/31/2022)   She usually feels better when the hemoglobin is closer to 11 but because of insurance reasons we are only able to do Retacrit to hemoglobin of 10.5. Patient is going to undergo a major knee surgery and will be 6 weeks in rehab.  I gave her a prescription for labs to be done on 10/31/2022.  Telephone visit day after to discuss results and to plan her Retacrit injections accordingly.

## 2022-11-01 NOTE — Progress Notes (Signed)
RN faxed orders for Retacrit 40,000 units q 3 weeks subcu for hgb less than 10.5 and order for lab CBC draw q 3 weeks to [facility] [phone]).

## 2022-11-05 ENCOUNTER — Other Ambulatory Visit: Payer: Self-pay | Admitting: *Deleted

## 2022-11-05 DIAGNOSIS — D631 Anemia in chronic kidney disease: Secondary | ICD-10-CM

## 2022-11-06 ENCOUNTER — Other Ambulatory Visit: Payer: Self-pay

## 2022-11-06 ENCOUNTER — Inpatient Hospital Stay: Payer: Medicare PPO

## 2022-11-06 VITALS — BP 143/74 | HR 65 | Temp 99.7°F | Resp 18

## 2022-11-06 DIAGNOSIS — E279 Disorder of adrenal gland, unspecified: Secondary | ICD-10-CM | POA: Diagnosis not present

## 2022-11-06 DIAGNOSIS — D631 Anemia in chronic kidney disease: Secondary | ICD-10-CM

## 2022-11-06 DIAGNOSIS — N1831 Chronic kidney disease, stage 3a: Secondary | ICD-10-CM | POA: Diagnosis not present

## 2022-11-06 DIAGNOSIS — E039 Hypothyroidism, unspecified: Secondary | ICD-10-CM | POA: Diagnosis not present

## 2022-11-06 DIAGNOSIS — D638 Anemia in other chronic diseases classified elsewhere: Secondary | ICD-10-CM

## 2022-11-06 DIAGNOSIS — D649 Anemia, unspecified: Secondary | ICD-10-CM

## 2022-11-06 LAB — CBC WITH DIFFERENTIAL (CANCER CENTER ONLY)
Abs Immature Granulocytes: 0.03 10*3/uL (ref 0.00–0.07)
Basophils Absolute: 0 10*3/uL (ref 0.0–0.1)
Basophils Relative: 1 %
Eosinophils Absolute: 0.1 10*3/uL (ref 0.0–0.5)
Eosinophils Relative: 1 %
HCT: 30.8 % — ABNORMAL LOW (ref 36.0–46.0)
Hemoglobin: 9.4 g/dL — ABNORMAL LOW (ref 12.0–15.0)
Immature Granulocytes: 0 %
Lymphocytes Relative: 14 %
Lymphs Abs: 1.1 10*3/uL (ref 0.7–4.0)
MCH: 28.8 pg (ref 26.0–34.0)
MCHC: 30.5 g/dL (ref 30.0–36.0)
MCV: 94.5 fL (ref 80.0–100.0)
Monocytes Absolute: 0.5 10*3/uL (ref 0.1–1.0)
Monocytes Relative: 7 %
Neutro Abs: 5.7 10*3/uL (ref 1.7–7.7)
Neutrophils Relative %: 77 %
Platelet Count: 248 10*3/uL (ref 150–400)
RBC: 3.26 MIL/uL — ABNORMAL LOW (ref 3.87–5.11)
RDW: 16.2 % — ABNORMAL HIGH (ref 11.5–15.5)
WBC Count: 7.4 10*3/uL (ref 4.0–10.5)
nRBC: 0 % (ref 0.0–0.2)

## 2022-11-06 MED ORDER — EPOETIN ALFA-EPBX 40000 UNIT/ML IJ SOLN
40000.0000 [IU] | Freq: Once | INTRAMUSCULAR | Status: AC
  Administered 2022-11-06: 40000 [IU] via SUBCUTANEOUS
  Filled 2022-11-06: qty 1

## 2022-11-08 DIAGNOSIS — Z9189 Other specified personal risk factors, not elsewhere classified: Secondary | ICD-10-CM | POA: Diagnosis not present

## 2022-11-08 DIAGNOSIS — F32A Depression, unspecified: Secondary | ICD-10-CM | POA: Diagnosis not present

## 2022-11-08 DIAGNOSIS — I152 Hypertension secondary to endocrine disorders: Secondary | ICD-10-CM | POA: Diagnosis not present

## 2022-11-08 DIAGNOSIS — F609 Personality disorder, unspecified: Secondary | ICD-10-CM | POA: Diagnosis not present

## 2022-11-08 DIAGNOSIS — M069 Rheumatoid arthritis, unspecified: Secondary | ICD-10-CM | POA: Diagnosis not present

## 2022-11-08 DIAGNOSIS — E1169 Type 2 diabetes mellitus with other specified complication: Secondary | ICD-10-CM | POA: Diagnosis not present

## 2022-11-08 DIAGNOSIS — D6489 Other specified anemias: Secondary | ICD-10-CM | POA: Diagnosis not present

## 2022-11-08 DIAGNOSIS — M76891 Other specified enthesopathies of right lower limb, excluding foot: Secondary | ICD-10-CM | POA: Diagnosis not present

## 2022-11-13 DIAGNOSIS — E785 Hyperlipidemia, unspecified: Secondary | ICD-10-CM | POA: Diagnosis not present

## 2022-11-13 DIAGNOSIS — I4891 Unspecified atrial fibrillation: Secondary | ICD-10-CM | POA: Diagnosis not present

## 2022-11-13 DIAGNOSIS — I152 Hypertension secondary to endocrine disorders: Secondary | ICD-10-CM | POA: Diagnosis not present

## 2022-11-13 DIAGNOSIS — E1159 Type 2 diabetes mellitus with other circulatory complications: Secondary | ICD-10-CM | POA: Diagnosis not present

## 2022-11-13 DIAGNOSIS — F32A Depression, unspecified: Secondary | ICD-10-CM | POA: Diagnosis not present

## 2022-11-13 DIAGNOSIS — I129 Hypertensive chronic kidney disease with stage 1 through stage 4 chronic kidney disease, or unspecified chronic kidney disease: Secondary | ICD-10-CM | POA: Diagnosis not present

## 2022-11-13 DIAGNOSIS — N189 Chronic kidney disease, unspecified: Secondary | ICD-10-CM | POA: Diagnosis not present

## 2022-11-13 DIAGNOSIS — E1122 Type 2 diabetes mellitus with diabetic chronic kidney disease: Secondary | ICD-10-CM | POA: Diagnosis not present

## 2022-11-19 DIAGNOSIS — E782 Mixed hyperlipidemia: Secondary | ICD-10-CM | POA: Diagnosis not present

## 2022-11-19 DIAGNOSIS — I129 Hypertensive chronic kidney disease with stage 1 through stage 4 chronic kidney disease, or unspecified chronic kidney disease: Secondary | ICD-10-CM | POA: Diagnosis not present

## 2022-11-19 DIAGNOSIS — N189 Chronic kidney disease, unspecified: Secondary | ICD-10-CM | POA: Diagnosis not present

## 2022-11-19 DIAGNOSIS — I4891 Unspecified atrial fibrillation: Secondary | ICD-10-CM | POA: Diagnosis not present

## 2022-11-19 DIAGNOSIS — E039 Hypothyroidism, unspecified: Secondary | ICD-10-CM | POA: Diagnosis not present

## 2022-11-19 DIAGNOSIS — S76119D Strain of unspecified quadriceps muscle, fascia and tendon, subsequent encounter: Secondary | ICD-10-CM | POA: Diagnosis not present

## 2022-11-19 DIAGNOSIS — E1122 Type 2 diabetes mellitus with diabetic chronic kidney disease: Secondary | ICD-10-CM | POA: Diagnosis not present

## 2022-11-19 DIAGNOSIS — D62 Acute posthemorrhagic anemia: Secondary | ICD-10-CM | POA: Diagnosis not present

## 2022-11-19 DIAGNOSIS — M069 Rheumatoid arthritis, unspecified: Secondary | ICD-10-CM | POA: Diagnosis not present

## 2022-11-21 DIAGNOSIS — J45909 Unspecified asthma, uncomplicated: Secondary | ICD-10-CM | POA: Diagnosis not present

## 2022-11-21 DIAGNOSIS — E1122 Type 2 diabetes mellitus with diabetic chronic kidney disease: Secondary | ICD-10-CM | POA: Diagnosis not present

## 2022-11-21 DIAGNOSIS — S76111D Strain of right quadriceps muscle, fascia and tendon, subsequent encounter: Secondary | ICD-10-CM | POA: Diagnosis not present

## 2022-11-21 DIAGNOSIS — N1831 Chronic kidney disease, stage 3a: Secondary | ICD-10-CM | POA: Diagnosis not present

## 2022-11-21 DIAGNOSIS — M6281 Muscle weakness (generalized): Secondary | ICD-10-CM | POA: Diagnosis not present

## 2022-11-21 DIAGNOSIS — I152 Hypertension secondary to endocrine disorders: Secondary | ICD-10-CM | POA: Diagnosis not present

## 2022-11-21 DIAGNOSIS — R2681 Unsteadiness on feet: Secondary | ICD-10-CM | POA: Diagnosis not present

## 2022-11-21 DIAGNOSIS — I4891 Unspecified atrial fibrillation: Secondary | ICD-10-CM | POA: Diagnosis not present

## 2022-11-21 DIAGNOSIS — E1159 Type 2 diabetes mellitus with other circulatory complications: Secondary | ICD-10-CM | POA: Diagnosis not present

## 2022-11-21 DIAGNOSIS — M069 Rheumatoid arthritis, unspecified: Secondary | ICD-10-CM | POA: Diagnosis not present

## 2022-11-21 DIAGNOSIS — G8929 Other chronic pain: Secondary | ICD-10-CM | POA: Diagnosis not present

## 2022-11-26 DIAGNOSIS — E1122 Type 2 diabetes mellitus with diabetic chronic kidney disease: Secondary | ICD-10-CM | POA: Diagnosis not present

## 2022-11-26 DIAGNOSIS — M069 Rheumatoid arthritis, unspecified: Secondary | ICD-10-CM | POA: Diagnosis not present

## 2022-11-26 DIAGNOSIS — N1831 Chronic kidney disease, stage 3a: Secondary | ICD-10-CM | POA: Diagnosis not present

## 2022-11-26 DIAGNOSIS — I4891 Unspecified atrial fibrillation: Secondary | ICD-10-CM | POA: Diagnosis not present

## 2022-11-26 DIAGNOSIS — G8929 Other chronic pain: Secondary | ICD-10-CM | POA: Diagnosis not present

## 2022-11-26 DIAGNOSIS — S76111D Strain of right quadriceps muscle, fascia and tendon, subsequent encounter: Secondary | ICD-10-CM | POA: Diagnosis not present

## 2022-11-26 DIAGNOSIS — J45909 Unspecified asthma, uncomplicated: Secondary | ICD-10-CM | POA: Diagnosis not present

## 2022-11-26 DIAGNOSIS — I152 Hypertension secondary to endocrine disorders: Secondary | ICD-10-CM | POA: Diagnosis not present

## 2022-11-26 DIAGNOSIS — E1159 Type 2 diabetes mellitus with other circulatory complications: Secondary | ICD-10-CM | POA: Diagnosis not present

## 2022-11-27 ENCOUNTER — Inpatient Hospital Stay: Payer: Medicare PPO

## 2022-11-27 DIAGNOSIS — E039 Hypothyroidism, unspecified: Secondary | ICD-10-CM | POA: Diagnosis not present

## 2022-11-27 DIAGNOSIS — D631 Anemia in chronic kidney disease: Secondary | ICD-10-CM | POA: Diagnosis not present

## 2022-11-27 DIAGNOSIS — N183 Chronic kidney disease, stage 3 unspecified: Secondary | ICD-10-CM

## 2022-11-27 DIAGNOSIS — E279 Disorder of adrenal gland, unspecified: Secondary | ICD-10-CM | POA: Diagnosis not present

## 2022-11-27 DIAGNOSIS — N1831 Chronic kidney disease, stage 3a: Secondary | ICD-10-CM | POA: Diagnosis not present

## 2022-11-27 LAB — CBC WITH DIFFERENTIAL (CANCER CENTER ONLY)
Abs Immature Granulocytes: 0.02 10*3/uL (ref 0.00–0.07)
Basophils Absolute: 0 10*3/uL (ref 0.0–0.1)
Basophils Relative: 1 %
Eosinophils Absolute: 0.1 10*3/uL (ref 0.0–0.5)
Eosinophils Relative: 2 %
HCT: 35.5 % — ABNORMAL LOW (ref 36.0–46.0)
Hemoglobin: 11 g/dL — ABNORMAL LOW (ref 12.0–15.0)
Immature Granulocytes: 0 %
Lymphocytes Relative: 19 %
Lymphs Abs: 1.1 10*3/uL (ref 0.7–4.0)
MCH: 28.7 pg (ref 26.0–34.0)
MCHC: 31 g/dL (ref 30.0–36.0)
MCV: 92.7 fL (ref 80.0–100.0)
Monocytes Absolute: 0.6 10*3/uL (ref 0.1–1.0)
Monocytes Relative: 9 %
Neutro Abs: 4.2 10*3/uL (ref 1.7–7.7)
Neutrophils Relative %: 69 %
Platelet Count: 200 10*3/uL (ref 150–400)
RBC: 3.83 MIL/uL — ABNORMAL LOW (ref 3.87–5.11)
RDW: 15.9 % — ABNORMAL HIGH (ref 11.5–15.5)
WBC Count: 6 10*3/uL (ref 4.0–10.5)
nRBC: 0 % (ref 0.0–0.2)

## 2022-11-27 NOTE — Progress Notes (Signed)
Pt. Here for Retacrit injection.  HGB 11.0.  Pt. Meets parameter.  Injection not given.

## 2022-11-28 ENCOUNTER — Ambulatory Visit: Payer: Medicare PPO | Admitting: Podiatry

## 2022-11-28 ENCOUNTER — Encounter: Payer: Self-pay | Admitting: Podiatry

## 2022-11-28 VITALS — BP 157/72

## 2022-11-28 DIAGNOSIS — M79674 Pain in right toe(s): Secondary | ICD-10-CM

## 2022-11-28 DIAGNOSIS — L84 Corns and callosities: Secondary | ICD-10-CM

## 2022-11-28 DIAGNOSIS — M79675 Pain in left toe(s): Secondary | ICD-10-CM

## 2022-11-28 DIAGNOSIS — E1142 Type 2 diabetes mellitus with diabetic polyneuropathy: Secondary | ICD-10-CM | POA: Diagnosis not present

## 2022-11-28 DIAGNOSIS — B351 Tinea unguium: Secondary | ICD-10-CM

## 2022-11-28 NOTE — Progress Notes (Signed)
  Subjective:  Patient ID: Jocelyn Kaufman, female    DOB: 10/25/1956,  MRN: [id number]  MABRY SANTARELLI presents to clinic today for at risk foot care with history of diabetic neuropathy and thick, elongated toenails bilateral great toes which are tender when wearing enclosed shoe gear.  Chief Complaint  Patient presents with   Nail Problem    Routine foot care    Patient has been using Ciclopirox solution over a year and would like to discuss progress of this topical medication.  PCP is Deland Pretty, MD.  Allergies  Allergen Reactions   Semaglutide(0.25 Or 0.'5mg'$ -Dos) Other (See Comments)    Causes cataracts requiring surgery and impairing vision    Shellfish-Derived Products Anaphylaxis, Swelling and Other (See Comments)    Patient avoids   Empagliflozin Itching, Swelling and Other (See Comments)    UTI sx's and yeast infections   Rho (D) Immune Globulin      Not available   Tramadol Hcl Other (See Comments)    hallucinations   Canagliflozin Other (See Comments)    Urinary incontinence    Latex Rash   Liraglutide Itching    Other reaction(s): itching all over   Minoxidil Rash    skin irritation   Omeprazole Rash   Percocet [Oxycodone-Acetaminophen] Nausea And Vomiting    Review of Systems: Negative except as noted in the HPI.  Objective: No changes noted in today's physical examination. Vitals:   11/28/22 1444  BP: (!) 157/72   Jocelyn Kaufman is a pleasant 67 y.o. female in NAD. AAO x 3.  Vascular Examination: CFT immediate b/l LE. Palpable DP/PT pulses b/l LE. Digital hair present b/l. Skin temperature gradient WNL b/l. No pain with calf compression b/l. No edema noted b/l. No cyanosis or clubbing noted b/l LE.  Dermatological Examination: Pedal integument with normal turgor, texture and tone b/l LE. No open wounds b/l. No interdigital macerations b/l.   Toenails 1-5 b/l elongated, thickened, discolored with subungual debris. There is some clearing to proximal 1/3 of  b/l great toes.   Neurological Examination: Pt has subjective symptoms of neuropathy. Protective sensation intact 5/5 intact bilaterally with 10g monofilament b/l. Vibratory sensation intact b/l.  Musculoskeletal Examination: Normal muscle strength 5/5 to all lower extremity muscle groups bilaterally. 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 No pain, crepitus or joint limitation noted with ROM b/l LE.  Patient ambulates with cane assistance.  Assessment/Plan: 1. Pain due to onychomycosis of toenails of both feet   2. Diabetic peripheral neuropathy associated with type 2 diabetes mellitus ([place])     -Discussed topical treatment for onychomycosis. She has been using Ciclopirox for over a year. Will discontinue and start once daily application of tea tree oil to toenails. -Continue foot and shoe inspections daily. Monitor blood glucose per PCP/Endocrinologist's recommendations. -Toenails 1-5 b/l were debrided in length and girth with sterile nail nippers and dremel without iatrogenic bleeding.  -Patient/POA to call should there be question/concern in the interim.   Return in about 3 months (around 02/26/2023).  Marzetta Board, DPM

## 2022-11-29 DIAGNOSIS — G8929 Other chronic pain: Secondary | ICD-10-CM | POA: Diagnosis not present

## 2022-11-29 DIAGNOSIS — S76111D Strain of right quadriceps muscle, fascia and tendon, subsequent encounter: Secondary | ICD-10-CM | POA: Diagnosis not present

## 2022-11-29 DIAGNOSIS — I4891 Unspecified atrial fibrillation: Secondary | ICD-10-CM | POA: Diagnosis not present

## 2022-11-29 DIAGNOSIS — J45909 Unspecified asthma, uncomplicated: Secondary | ICD-10-CM | POA: Diagnosis not present

## 2022-11-29 DIAGNOSIS — M069 Rheumatoid arthritis, unspecified: Secondary | ICD-10-CM | POA: Diagnosis not present

## 2022-11-29 DIAGNOSIS — I152 Hypertension secondary to endocrine disorders: Secondary | ICD-10-CM | POA: Diagnosis not present

## 2022-11-29 DIAGNOSIS — E1159 Type 2 diabetes mellitus with other circulatory complications: Secondary | ICD-10-CM | POA: Diagnosis not present

## 2022-11-29 DIAGNOSIS — N1831 Chronic kidney disease, stage 3a: Secondary | ICD-10-CM | POA: Diagnosis not present

## 2022-11-29 DIAGNOSIS — E1122 Type 2 diabetes mellitus with diabetic chronic kidney disease: Secondary | ICD-10-CM | POA: Diagnosis not present

## 2022-11-30 ENCOUNTER — Telehealth: Payer: Self-pay

## 2022-11-30 DIAGNOSIS — E1122 Type 2 diabetes mellitus with diabetic chronic kidney disease: Secondary | ICD-10-CM | POA: Diagnosis not present

## 2022-11-30 DIAGNOSIS — E559 Vitamin D deficiency, unspecified: Secondary | ICD-10-CM | POA: Diagnosis not present

## 2022-11-30 NOTE — Telephone Encounter (Signed)
Vmt requesting call back to discuss next PREP class starting on 12/10/22 at [facility].

## 2022-11-30 NOTE — Telephone Encounter (Signed)
LVMT requesting call back reference next PREP class starting on 12/10/22

## 2022-12-03 DIAGNOSIS — I152 Hypertension secondary to endocrine disorders: Secondary | ICD-10-CM | POA: Diagnosis not present

## 2022-12-03 DIAGNOSIS — E1122 Type 2 diabetes mellitus with diabetic chronic kidney disease: Secondary | ICD-10-CM | POA: Diagnosis not present

## 2022-12-03 DIAGNOSIS — N1831 Chronic kidney disease, stage 3a: Secondary | ICD-10-CM | POA: Diagnosis not present

## 2022-12-03 DIAGNOSIS — G8929 Other chronic pain: Secondary | ICD-10-CM | POA: Diagnosis not present

## 2022-12-03 DIAGNOSIS — J45909 Unspecified asthma, uncomplicated: Secondary | ICD-10-CM | POA: Diagnosis not present

## 2022-12-03 DIAGNOSIS — I4891 Unspecified atrial fibrillation: Secondary | ICD-10-CM | POA: Diagnosis not present

## 2022-12-03 DIAGNOSIS — M069 Rheumatoid arthritis, unspecified: Secondary | ICD-10-CM | POA: Diagnosis not present

## 2022-12-03 DIAGNOSIS — E1159 Type 2 diabetes mellitus with other circulatory complications: Secondary | ICD-10-CM | POA: Diagnosis not present

## 2022-12-03 DIAGNOSIS — S76111D Strain of right quadriceps muscle, fascia and tendon, subsequent encounter: Secondary | ICD-10-CM | POA: Diagnosis not present

## 2022-12-04 DIAGNOSIS — S83411D Sprain of medial collateral ligament of right knee, subsequent encounter: Secondary | ICD-10-CM | POA: Diagnosis not present

## 2022-12-05 DIAGNOSIS — I10 Essential (primary) hypertension: Secondary | ICD-10-CM | POA: Diagnosis not present

## 2022-12-05 DIAGNOSIS — E1122 Type 2 diabetes mellitus with diabetic chronic kidney disease: Secondary | ICD-10-CM | POA: Diagnosis not present

## 2022-12-05 DIAGNOSIS — E039 Hypothyroidism, unspecified: Secondary | ICD-10-CM | POA: Diagnosis not present

## 2022-12-05 DIAGNOSIS — E785 Hyperlipidemia, unspecified: Secondary | ICD-10-CM | POA: Diagnosis not present

## 2022-12-05 DIAGNOSIS — E1121 Type 2 diabetes mellitus with diabetic nephropathy: Secondary | ICD-10-CM | POA: Diagnosis not present

## 2022-12-06 DIAGNOSIS — M069 Rheumatoid arthritis, unspecified: Secondary | ICD-10-CM | POA: Diagnosis not present

## 2022-12-06 DIAGNOSIS — E1122 Type 2 diabetes mellitus with diabetic chronic kidney disease: Secondary | ICD-10-CM | POA: Diagnosis not present

## 2022-12-06 DIAGNOSIS — E1159 Type 2 diabetes mellitus with other circulatory complications: Secondary | ICD-10-CM | POA: Diagnosis not present

## 2022-12-06 DIAGNOSIS — N1831 Chronic kidney disease, stage 3a: Secondary | ICD-10-CM | POA: Diagnosis not present

## 2022-12-06 DIAGNOSIS — G8929 Other chronic pain: Secondary | ICD-10-CM | POA: Diagnosis not present

## 2022-12-06 DIAGNOSIS — J45909 Unspecified asthma, uncomplicated: Secondary | ICD-10-CM | POA: Diagnosis not present

## 2022-12-06 DIAGNOSIS — I4891 Unspecified atrial fibrillation: Secondary | ICD-10-CM | POA: Diagnosis not present

## 2022-12-06 DIAGNOSIS — I152 Hypertension secondary to endocrine disorders: Secondary | ICD-10-CM | POA: Diagnosis not present

## 2022-12-06 DIAGNOSIS — S76111D Strain of right quadriceps muscle, fascia and tendon, subsequent encounter: Secondary | ICD-10-CM | POA: Diagnosis not present

## 2022-12-10 DIAGNOSIS — M069 Rheumatoid arthritis, unspecified: Secondary | ICD-10-CM | POA: Diagnosis not present

## 2022-12-10 DIAGNOSIS — G8929 Other chronic pain: Secondary | ICD-10-CM | POA: Diagnosis not present

## 2022-12-10 DIAGNOSIS — J45909 Unspecified asthma, uncomplicated: Secondary | ICD-10-CM | POA: Diagnosis not present

## 2022-12-10 DIAGNOSIS — E1122 Type 2 diabetes mellitus with diabetic chronic kidney disease: Secondary | ICD-10-CM | POA: Diagnosis not present

## 2022-12-10 DIAGNOSIS — S76111D Strain of right quadriceps muscle, fascia and tendon, subsequent encounter: Secondary | ICD-10-CM | POA: Diagnosis not present

## 2022-12-10 DIAGNOSIS — I4891 Unspecified atrial fibrillation: Secondary | ICD-10-CM | POA: Diagnosis not present

## 2022-12-10 DIAGNOSIS — N1831 Chronic kidney disease, stage 3a: Secondary | ICD-10-CM | POA: Diagnosis not present

## 2022-12-10 DIAGNOSIS — E1159 Type 2 diabetes mellitus with other circulatory complications: Secondary | ICD-10-CM | POA: Diagnosis not present

## 2022-12-10 DIAGNOSIS — I152 Hypertension secondary to endocrine disorders: Secondary | ICD-10-CM | POA: Diagnosis not present

## 2022-12-13 DIAGNOSIS — E1169 Type 2 diabetes mellitus with other specified complication: Secondary | ICD-10-CM | POA: Diagnosis not present

## 2022-12-13 DIAGNOSIS — M069 Rheumatoid arthritis, unspecified: Secondary | ICD-10-CM | POA: Diagnosis not present

## 2022-12-13 DIAGNOSIS — N1831 Chronic kidney disease, stage 3a: Secondary | ICD-10-CM | POA: Diagnosis not present

## 2022-12-13 DIAGNOSIS — I152 Hypertension secondary to endocrine disorders: Secondary | ICD-10-CM | POA: Diagnosis not present

## 2022-12-13 DIAGNOSIS — D649 Anemia, unspecified: Secondary | ICD-10-CM | POA: Diagnosis not present

## 2022-12-13 DIAGNOSIS — J45909 Unspecified asthma, uncomplicated: Secondary | ICD-10-CM | POA: Diagnosis not present

## 2022-12-13 DIAGNOSIS — E1159 Type 2 diabetes mellitus with other circulatory complications: Secondary | ICD-10-CM | POA: Diagnosis not present

## 2022-12-13 DIAGNOSIS — M797 Fibromyalgia: Secondary | ICD-10-CM | POA: Diagnosis not present

## 2022-12-13 DIAGNOSIS — M79643 Pain in unspecified hand: Secondary | ICD-10-CM | POA: Diagnosis not present

## 2022-12-13 DIAGNOSIS — M1990 Unspecified osteoarthritis, unspecified site: Secondary | ICD-10-CM | POA: Diagnosis not present

## 2022-12-13 DIAGNOSIS — I4891 Unspecified atrial fibrillation: Secondary | ICD-10-CM | POA: Diagnosis not present

## 2022-12-13 DIAGNOSIS — M25569 Pain in unspecified knee: Secondary | ICD-10-CM | POA: Diagnosis not present

## 2022-12-13 DIAGNOSIS — S76111D Strain of right quadriceps muscle, fascia and tendon, subsequent encounter: Secondary | ICD-10-CM | POA: Diagnosis not present

## 2022-12-13 DIAGNOSIS — G8929 Other chronic pain: Secondary | ICD-10-CM | POA: Diagnosis not present

## 2022-12-13 DIAGNOSIS — Z79899 Other long term (current) drug therapy: Secondary | ICD-10-CM | POA: Diagnosis not present

## 2022-12-13 DIAGNOSIS — M1711 Unilateral primary osteoarthritis, right knee: Secondary | ICD-10-CM | POA: Diagnosis not present

## 2022-12-13 DIAGNOSIS — M0579 Rheumatoid arthritis with rheumatoid factor of multiple sites without organ or systems involvement: Secondary | ICD-10-CM | POA: Diagnosis not present

## 2022-12-13 DIAGNOSIS — E1122 Type 2 diabetes mellitus with diabetic chronic kidney disease: Secondary | ICD-10-CM | POA: Diagnosis not present

## 2022-12-14 ENCOUNTER — Other Ambulatory Visit: Payer: Self-pay

## 2022-12-14 ENCOUNTER — Other Ambulatory Visit: Payer: Self-pay | Admitting: *Deleted

## 2022-12-14 DIAGNOSIS — D649 Anemia, unspecified: Secondary | ICD-10-CM

## 2022-12-17 NOTE — Assessment & Plan Note (Signed)
Patient has history of hypothyroidism as well as possible adrenal dysfunction issues for which [facility] goes to [facility]   Previous blood work review: 1.  Erythropoietin: 24.2 2. SPEP: No M protein 3.  Haptoglobin: 146, LDH 281, absolute reticulocyte count 96.4, direct Coombs test negative 4.  B12 750 5.  Folic acid [id number] 6.  Ferritin 88, iron saturation 25 7. 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  (Retacrit injections for hemoglobin 10.5 or less) Lab review 03/23/2020: Erythropoietin 27.9, hemoglobin 9.6 02/02/2021: Hemoglobin 10.6 (given her severe symptoms continue with Aranesp) 06/22/2021: Hemoglobin 11.3  11/07/2021: Hemoglobin 11.1 11/21/2021: Hemoglobin 10.5 (no Retacrit today) 01/02/2022: Hemoglobin 10.7 (no Retacrit today): Last Retacrit was on 12/05/2021  02/01/2022: Hemoglobin 10.3: Proceeding to Retacrit today 05/24/2022: Hemoglobin 10.4: Retacrit being given today 08/31/2022: Hemoglobin 10.1 09/19/2022: Hemoglobin 11.2 (Retacrit given 08/31/2022) 12/18/2022: Hemoglobin 10.6   She usually feels better when the hemoglobin is closer to 11 but because of insurance reasons we are only able to do Retacrit to hemoglobin of 10.5.  Patient is going to undergo a major knee surgery and will be 6 weeks in rehab.  I gave her a prescription for labs to be done on    Recheck labs and follow-up in 4 weeks

## 2022-12-17 NOTE — Progress Notes (Signed)
Patient Care Team: Deland Pretty, MD as PCP - General (Internal Medicine) Debara Pickett Nadean Corwin, MD as PCP - Cardiology (Cardiology) Nicholas Lose, MD as Consulting Physician (Hematology and Oncology) Lahoma Rocker, MD as Consulting Physician (Rheumatology) Madelin Rear, MD as Consulting Physician (Endocrinology) Marygrace Drought, MD as Consulting Physician (Ophthalmology) Claudia Desanctis, MD as Consulting Physician (Nephrology)  DIAGNOSIS: No diagnosis found.  SUMMARY OF ONCOLOGIC HISTORY: Oncology History   No history exists.    CHIEF COMPLIANT:   INTERVAL HISTORY: Jocelyn Kaufman is a   ALLERGIES:  is allergic to semaglutide(0.25 or 0.3m-dos), shellfish-derived products, empagliflozin, rho (d) immune globulin, tramadol hcl, canagliflozin, latex, liraglutide, minoxidil, omeprazole, and percocet [oxycodone-acetaminophen].  MEDICATIONS:  Current Outpatient Medications  Medication Sig Dispense Refill   Accu-Chek Softclix Lancets lancets use to check blood sugar     Aflibercept (EYLEA) 2 MG/0.05ML SOLN 2 mg by Intravitreal route every 3 (three) months.     albuterol (VENTOLIN HFA) 108 (90 Base) MCG/ACT inhaler Inhale two puffs every four to six hours as needed for cough or wheeze. (Patient taking differently: Inhale 1-2 puffs into the lungs every 6 (six) hours as needed for wheezing.) 18 g 2   amLODipine (NORVASC) 10 MG tablet Take 1 tablet (10 mg total) by mouth daily.     apixaban (ELIQUIS) 5 MG TABS tablet Take 1 tablet (5 mg total) by mouth 2 (two) times daily. 60 tablet 0   atorvastatin (LIPITOR) 40 MG tablet Take 1 tablet (40 mg total) by mouth daily at 6 PM. (Patient taking differently: Take 40 mg by mouth daily.) 30 tablet 0   B-D ULTRAFINE III SHORT PEN 31G X 8 MM MISC SMARTSIG:1 Each SUB-Q Daily     CHELATED IRON PO Take 18 mg by mouth daily.     Cholecalciferol (VITAMIN D3) 125 MCG (5000 UT) CAPS Take 5,000 Units by mouth daily.     ciclopirox (PENLAC) 8 % solution Apply  topically at bedtime. Apply over nail and surrounding skin. Apply daily over previous coat. After seven (7) days, may remove with alcohol and continue cycle. 6.6 mL 0   Dulaglutide (TRULICITY) 1.5 [id number] Inject 1.5 mg into the skin every Tuesday.     EPINEPHrine (EPIPEN 2-PAK) 0.3 mg/0.3 mL IJ SOAJ injection Use as directed for life-threatening allergic reaction. 2 each 1   epoetin alfa-epbx (RETACRIT) 2000 UNIT/ML injection See admin instructions.     escitalopram (LEXAPRO) 20 MG tablet Take 20 mg by mouth daily.     ezetimibe (ZETIA) 10 MG tablet Take 1 tablet (10 mg total) by mouth daily.     fexofenadine (ALLEGRA ALLERGY) 180 MG tablet Take 1 tablet (180 mg total) by mouth daily.     folic acid (FOLVITE) 1 MG tablet Take 2 mg by mouth daily.     gabapentin (NEURONTIN) 100 MG capsule Take 1 tablet (1034m in the morning, 1 tablet (100 mg) in the afternoon, take 3 tablets (3004mat bedtime 90 capsule 0   hydrALAZINE (APRESOLINE) 50 MG tablet Take 50 mg by mouth 3 (three) times daily.     hydrocortisone cream 1 % Apply 1 Application topically 2 (two) times daily as needed for itching.     insulin aspart (NOVOLOG) 100 UNIT/ML FlexPen Inject 20 Units into the skin See admin instructions. Inject 20 units into the skin two to three times a day before meals     insulin glargine, 2 Unit Dial, (TOUJEO MAX SOLOSTAR) 300 UNIT/ML Solostar Pen Inject 46 Units into  the skin daily. (Patient taking differently: Inject 42 Units into the skin at bedtime.)     ipratropium (ATROVENT) 0.06 % nasal spray Place 1-2 sprays each nostril as needed up to 3 times a day (Patient taking differently: Place 1-2 sprays into both nostrils 3 (three) times daily as needed for rhinitis.) 15 mL 5   irbesartan (AVAPRO) 300 MG tablet Take 1 tablet (300 mg total) by mouth daily.     Lancets Misc. (ACCU-CHEK SOFTCLIX LANCET DEV) KIT See admin instructions.     latanoprost (XALATAN) 0.005 % ophthalmic solution Place 1 drop into  both eyes at bedtime.     lidocaine (LMX) 4 % cream Apply 1 Application topically as needed (pain).     methocarbamol (ROBAXIN-750) 750 MG tablet Take 1 tablet (750 mg total) by mouth every 8 (eight) hours as needed for muscle spasms. 30 tablet 0   Methotrexate Sodium (METHOTREXATE, PF,) 50 MG/2ML injection Inject 20 mg into the muscle every Wednesday. 0.8 ml     metoprolol succinate (TOPROL-XL) 25 MG 24 hr tablet Take 1 tablet (25 mg total) by mouth daily. May take additional half tablet as needed once daily for palpitations. (Patient taking differently: Take 12.5-25 mg by mouth See admin instructions. Take 25 mg by mouth once a day and an additional 12.5 mg (half tablet) as needed once daily for palpitations) 135 tablet 3   Multiple Vitamins-Minerals (CENTRUM SILVER PO) Take 1 tablet by mouth daily with breakfast.     oxyCODONE (ROXICODONE) 5 MG immediate release tablet Take 1-2 tablets (5-10 mg total) by mouth every 6 (six) hours as needed for severe pain. 40 tablet 0   predniSONE (DELTASONE) 10 MG tablet Take 10 mg by mouth daily.     sulfaSALAzine (AZULFIDINE) 500 MG tablet Take 3 tablets (1,500 mg total) by mouth 2 (two) times daily.     SYNTHROID 125 MCG tablet Take 125 mcg by mouth every Monday, Tuesday, Wednesday, Thursday, and Friday.     triamcinolone cream (KENALOG) 0.1 % Apply 1 Application topically 3 (three) times daily as needed (for itching- affected areas).     vitamin B-12 (CYANOCOBALAMIN) 1000 MCG tablet Take 1,000 mcg by mouth daily.     No current facility-administered medications for this visit.    PHYSICAL EXAMINATION: ECOG PERFORMANCE STATUS: {CHL ONC ECOG PS:[phone]}  There were no vitals filed for this visit. There were no vitals filed for this visit.  BREAST:*** No palpable masses or nodules in either right or left breasts. No palpable axillary supraclavicular or infraclavicular adenopathy no breast tenderness or nipple discharge. (exam performed in the presence  of a chaperone)  LABORATORY DATA:  I have reviewed the data as listed    Latest Ref Rng & Units 10/04/2022    7:15 AM 10/03/2022    3:21 AM 10/02/2022    4:08 AM  CMP  Glucose 70 - 99 mg/dL 143  134  103   BUN 8 - 23 mg/dL 35  22  17   Creatinine 0.44 - 1.00 mg/dL 1.39  1.45  1.05   Sodium 135 - 145 mmol/L 135  134  131   Potassium 3.5 - 5.1 mmol/L 4.8  4.9  5.0   Chloride 98 - 111 mmol/L 107  98  100   CO2 22 - 32 mmol/L 24  26  25   $ Calcium 8.9 - 10.3 mg/dL 8.4  8.8  8.5     Lab Results  Component Value Date   WBC 6.0 11/27/2022  HGB 11.0 (L) 11/27/2022   HCT 35.5 (L) 11/27/2022   MCV 92.7 11/27/2022   PLT 200 11/27/2022   NEUTROABS 4.2 11/27/2022    ASSESSMENT & PLAN:  No problem-specific Assessment & Plan notes found for this encounter.    No orders of the defined types were placed in this encounter.  The patient has a good understanding of the overall plan. she agrees with it. she will call with any problems that may develop before the next visit here. Total time spent: 30 mins including face to face time and time spent for planning, charting and co-ordination of care   Suzzette Righter, [place] 12/17/22    I Gardiner Coins am acting as a Education administrator for [employer]  ***

## 2022-12-18 ENCOUNTER — Inpatient Hospital Stay: Payer: Medicare PPO | Admitting: Hematology and Oncology

## 2022-12-18 ENCOUNTER — Other Ambulatory Visit: Payer: Self-pay

## 2022-12-18 ENCOUNTER — Inpatient Hospital Stay: Payer: Medicare PPO

## 2022-12-18 ENCOUNTER — Inpatient Hospital Stay: Payer: Medicare PPO | Attending: Hematology and Oncology

## 2022-12-18 VITALS — BP 138/56 | HR 65 | Temp 97.3°F | Resp 16 | Wt 233.4 lb

## 2022-12-18 DIAGNOSIS — N183 Chronic kidney disease, stage 3 unspecified: Secondary | ICD-10-CM

## 2022-12-18 DIAGNOSIS — N1831 Chronic kidney disease, stage 3a: Secondary | ICD-10-CM | POA: Insufficient documentation

## 2022-12-18 DIAGNOSIS — D631 Anemia in chronic kidney disease: Secondary | ICD-10-CM | POA: Diagnosis not present

## 2022-12-18 DIAGNOSIS — D649 Anemia, unspecified: Secondary | ICD-10-CM

## 2022-12-18 DIAGNOSIS — D638 Anemia in other chronic diseases classified elsewhere: Secondary | ICD-10-CM

## 2022-12-18 LAB — CBC WITH DIFFERENTIAL (CANCER CENTER ONLY)
Abs Immature Granulocytes: 0.04 10*3/uL (ref 0.00–0.07)
Basophils Absolute: 0 10*3/uL (ref 0.0–0.1)
Basophils Relative: 1 %
Eosinophils Absolute: 0.2 10*3/uL (ref 0.0–0.5)
Eosinophils Relative: 2 %
HCT: 34.1 % — ABNORMAL LOW (ref 36.0–46.0)
Hemoglobin: 10.6 g/dL — ABNORMAL LOW (ref 12.0–15.0)
Immature Granulocytes: 1 %
Lymphocytes Relative: 14 %
Lymphs Abs: 1.1 10*3/uL (ref 0.7–4.0)
MCH: 28.6 pg (ref 26.0–34.0)
MCHC: 31.1 g/dL (ref 30.0–36.0)
MCV: 92.2 fL (ref 80.0–100.0)
Monocytes Absolute: 0.5 10*3/uL (ref 0.1–1.0)
Monocytes Relative: 6 %
Neutro Abs: 6.4 10*3/uL (ref 1.7–7.7)
Neutrophils Relative %: 76 %
Platelet Count: 209 10*3/uL (ref 150–400)
RBC: 3.7 MIL/uL — ABNORMAL LOW (ref 3.87–5.11)
RDW: 15.5 % (ref 11.5–15.5)
WBC Count: 8.2 10*3/uL (ref 4.0–10.5)
nRBC: 0 % (ref 0.0–0.2)

## 2022-12-18 MED ORDER — EPOETIN ALFA-EPBX 40000 UNIT/ML IJ SOLN
40000.0000 [IU] | Freq: Once | INTRAMUSCULAR | Status: AC
  Administered 2022-12-18: 40000 [IU] via SUBCUTANEOUS
  Filled 2022-12-18: qty 1

## 2022-12-18 NOTE — Patient Instructions (Signed)

## 2022-12-20 DIAGNOSIS — G8929 Other chronic pain: Secondary | ICD-10-CM | POA: Diagnosis not present

## 2022-12-20 DIAGNOSIS — I4891 Unspecified atrial fibrillation: Secondary | ICD-10-CM | POA: Diagnosis not present

## 2022-12-20 DIAGNOSIS — E1122 Type 2 diabetes mellitus with diabetic chronic kidney disease: Secondary | ICD-10-CM | POA: Diagnosis not present

## 2022-12-20 DIAGNOSIS — E1159 Type 2 diabetes mellitus with other circulatory complications: Secondary | ICD-10-CM | POA: Diagnosis not present

## 2022-12-20 DIAGNOSIS — M069 Rheumatoid arthritis, unspecified: Secondary | ICD-10-CM | POA: Diagnosis not present

## 2022-12-20 DIAGNOSIS — J45909 Unspecified asthma, uncomplicated: Secondary | ICD-10-CM | POA: Diagnosis not present

## 2022-12-20 DIAGNOSIS — S76111D Strain of right quadriceps muscle, fascia and tendon, subsequent encounter: Secondary | ICD-10-CM | POA: Diagnosis not present

## 2022-12-20 DIAGNOSIS — N1831 Chronic kidney disease, stage 3a: Secondary | ICD-10-CM | POA: Diagnosis not present

## 2022-12-20 DIAGNOSIS — E113491 Type 2 diabetes mellitus with severe nonproliferative diabetic retinopathy without macular edema, right eye: Secondary | ICD-10-CM | POA: Diagnosis not present

## 2022-12-20 DIAGNOSIS — I152 Hypertension secondary to endocrine disorders: Secondary | ICD-10-CM | POA: Diagnosis not present

## 2022-12-21 DIAGNOSIS — E1159 Type 2 diabetes mellitus with other circulatory complications: Secondary | ICD-10-CM | POA: Diagnosis not present

## 2022-12-21 DIAGNOSIS — S76111D Strain of right quadriceps muscle, fascia and tendon, subsequent encounter: Secondary | ICD-10-CM | POA: Diagnosis not present

## 2022-12-21 DIAGNOSIS — I152 Hypertension secondary to endocrine disorders: Secondary | ICD-10-CM | POA: Diagnosis not present

## 2022-12-21 DIAGNOSIS — E1122 Type 2 diabetes mellitus with diabetic chronic kidney disease: Secondary | ICD-10-CM | POA: Diagnosis not present

## 2022-12-21 DIAGNOSIS — J45909 Unspecified asthma, uncomplicated: Secondary | ICD-10-CM | POA: Diagnosis not present

## 2022-12-21 DIAGNOSIS — N1831 Chronic kidney disease, stage 3a: Secondary | ICD-10-CM | POA: Diagnosis not present

## 2022-12-21 DIAGNOSIS — M069 Rheumatoid arthritis, unspecified: Secondary | ICD-10-CM | POA: Diagnosis not present

## 2022-12-21 DIAGNOSIS — G8929 Other chronic pain: Secondary | ICD-10-CM | POA: Diagnosis not present

## 2022-12-21 DIAGNOSIS — I4891 Unspecified atrial fibrillation: Secondary | ICD-10-CM | POA: Diagnosis not present

## 2022-12-24 DIAGNOSIS — M0579 Rheumatoid arthritis with rheumatoid factor of multiple sites without organ or systems involvement: Secondary | ICD-10-CM | POA: Diagnosis not present

## 2022-12-25 DIAGNOSIS — E113512 Type 2 diabetes mellitus with proliferative diabetic retinopathy with macular edema, left eye: Secondary | ICD-10-CM | POA: Diagnosis not present

## 2022-12-27 DIAGNOSIS — I4891 Unspecified atrial fibrillation: Secondary | ICD-10-CM | POA: Diagnosis not present

## 2022-12-27 DIAGNOSIS — M069 Rheumatoid arthritis, unspecified: Secondary | ICD-10-CM | POA: Diagnosis not present

## 2022-12-27 DIAGNOSIS — S76111D Strain of right quadriceps muscle, fascia and tendon, subsequent encounter: Secondary | ICD-10-CM | POA: Diagnosis not present

## 2022-12-27 DIAGNOSIS — N1831 Chronic kidney disease, stage 3a: Secondary | ICD-10-CM | POA: Diagnosis not present

## 2022-12-27 DIAGNOSIS — G8929 Other chronic pain: Secondary | ICD-10-CM | POA: Diagnosis not present

## 2022-12-27 DIAGNOSIS — E1122 Type 2 diabetes mellitus with diabetic chronic kidney disease: Secondary | ICD-10-CM | POA: Diagnosis not present

## 2022-12-27 DIAGNOSIS — E1159 Type 2 diabetes mellitus with other circulatory complications: Secondary | ICD-10-CM | POA: Diagnosis not present

## 2022-12-27 DIAGNOSIS — J45909 Unspecified asthma, uncomplicated: Secondary | ICD-10-CM | POA: Diagnosis not present

## 2022-12-27 DIAGNOSIS — I152 Hypertension secondary to endocrine disorders: Secondary | ICD-10-CM | POA: Diagnosis not present

## 2023-01-02 DIAGNOSIS — E1122 Type 2 diabetes mellitus with diabetic chronic kidney disease: Secondary | ICD-10-CM | POA: Diagnosis not present

## 2023-01-02 DIAGNOSIS — J45909 Unspecified asthma, uncomplicated: Secondary | ICD-10-CM | POA: Diagnosis not present

## 2023-01-02 DIAGNOSIS — E1159 Type 2 diabetes mellitus with other circulatory complications: Secondary | ICD-10-CM | POA: Diagnosis not present

## 2023-01-02 DIAGNOSIS — I4891 Unspecified atrial fibrillation: Secondary | ICD-10-CM | POA: Diagnosis not present

## 2023-01-02 DIAGNOSIS — M069 Rheumatoid arthritis, unspecified: Secondary | ICD-10-CM | POA: Diagnosis not present

## 2023-01-02 DIAGNOSIS — G8929 Other chronic pain: Secondary | ICD-10-CM | POA: Diagnosis not present

## 2023-01-02 DIAGNOSIS — I152 Hypertension secondary to endocrine disorders: Secondary | ICD-10-CM | POA: Diagnosis not present

## 2023-01-02 DIAGNOSIS — S76111D Strain of right quadriceps muscle, fascia and tendon, subsequent encounter: Secondary | ICD-10-CM | POA: Diagnosis not present

## 2023-01-02 DIAGNOSIS — N1831 Chronic kidney disease, stage 3a: Secondary | ICD-10-CM | POA: Diagnosis not present

## 2023-01-07 DIAGNOSIS — M0579 Rheumatoid arthritis with rheumatoid factor of multiple sites without organ or systems involvement: Secondary | ICD-10-CM | POA: Diagnosis not present

## 2023-01-08 ENCOUNTER — Inpatient Hospital Stay: Payer: Medicare PPO

## 2023-01-08 ENCOUNTER — Inpatient Hospital Stay: Payer: Medicare PPO | Attending: Hematology and Oncology

## 2023-01-08 ENCOUNTER — Other Ambulatory Visit: Payer: Self-pay | Admitting: *Deleted

## 2023-01-08 ENCOUNTER — Other Ambulatory Visit: Payer: Self-pay | Admitting: Lab

## 2023-01-08 ENCOUNTER — Other Ambulatory Visit: Payer: Self-pay

## 2023-01-08 VITALS — BP 147/63 | HR 66 | Temp 99.1°F | Resp 18

## 2023-01-08 DIAGNOSIS — D649 Anemia, unspecified: Secondary | ICD-10-CM

## 2023-01-08 DIAGNOSIS — N1831 Chronic kidney disease, stage 3a: Secondary | ICD-10-CM | POA: Insufficient documentation

## 2023-01-08 DIAGNOSIS — I129 Hypertensive chronic kidney disease with stage 1 through stage 4 chronic kidney disease, or unspecified chronic kidney disease: Secondary | ICD-10-CM | POA: Insufficient documentation

## 2023-01-08 DIAGNOSIS — D631 Anemia in chronic kidney disease: Secondary | ICD-10-CM | POA: Diagnosis not present

## 2023-01-08 DIAGNOSIS — E1122 Type 2 diabetes mellitus with diabetic chronic kidney disease: Secondary | ICD-10-CM | POA: Insufficient documentation

## 2023-01-08 DIAGNOSIS — D638 Anemia in other chronic diseases classified elsewhere: Secondary | ICD-10-CM

## 2023-01-08 LAB — CBC WITH DIFFERENTIAL (CANCER CENTER ONLY)
Abs Immature Granulocytes: 0.01 10*3/uL (ref 0.00–0.07)
Basophils Absolute: 0 10*3/uL (ref 0.0–0.1)
Basophils Relative: 0 %
Eosinophils Absolute: 0.1 10*3/uL (ref 0.0–0.5)
Eosinophils Relative: 1 %
HCT: 34 % — ABNORMAL LOW (ref 36.0–46.0)
Hemoglobin: 10.5 g/dL — ABNORMAL LOW (ref 12.0–15.0)
Immature Granulocytes: 0 %
Lymphocytes Relative: 13 %
Lymphs Abs: 0.8 10*3/uL (ref 0.7–4.0)
MCH: 28.5 pg (ref 26.0–34.0)
MCHC: 30.9 g/dL (ref 30.0–36.0)
MCV: 92.4 fL (ref 80.0–100.0)
Monocytes Absolute: 0.3 10*3/uL (ref 0.1–1.0)
Monocytes Relative: 5 %
Neutro Abs: 4.8 10*3/uL (ref 1.7–7.7)
Neutrophils Relative %: 81 %
Platelet Count: 198 10*3/uL (ref 150–400)
RBC: 3.68 MIL/uL — ABNORMAL LOW (ref 3.87–5.11)
RDW: 16.9 % — ABNORMAL HIGH (ref 11.5–15.5)
WBC Count: 6 10*3/uL (ref 4.0–10.5)
nRBC: 0 % (ref 0.0–0.2)

## 2023-01-08 MED ORDER — EPOETIN ALFA-EPBX 40000 UNIT/ML IJ SOLN
40000.0000 [IU] | Freq: Once | INTRAMUSCULAR | Status: AC
  Administered 2023-01-08: 40000 [IU] via SUBCUTANEOUS
  Filled 2023-01-08: qty 1

## 2023-01-08 NOTE — Patient Instructions (Signed)

## 2023-01-10 DIAGNOSIS — N1831 Chronic kidney disease, stage 3a: Secondary | ICD-10-CM | POA: Diagnosis not present

## 2023-01-10 DIAGNOSIS — J45909 Unspecified asthma, uncomplicated: Secondary | ICD-10-CM | POA: Diagnosis not present

## 2023-01-10 DIAGNOSIS — G8929 Other chronic pain: Secondary | ICD-10-CM | POA: Diagnosis not present

## 2023-01-10 DIAGNOSIS — E1159 Type 2 diabetes mellitus with other circulatory complications: Secondary | ICD-10-CM | POA: Diagnosis not present

## 2023-01-10 DIAGNOSIS — I4891 Unspecified atrial fibrillation: Secondary | ICD-10-CM | POA: Diagnosis not present

## 2023-01-10 DIAGNOSIS — E1122 Type 2 diabetes mellitus with diabetic chronic kidney disease: Secondary | ICD-10-CM | POA: Diagnosis not present

## 2023-01-10 DIAGNOSIS — I152 Hypertension secondary to endocrine disorders: Secondary | ICD-10-CM | POA: Diagnosis not present

## 2023-01-10 DIAGNOSIS — M069 Rheumatoid arthritis, unspecified: Secondary | ICD-10-CM | POA: Diagnosis not present

## 2023-01-10 DIAGNOSIS — S76111D Strain of right quadriceps muscle, fascia and tendon, subsequent encounter: Secondary | ICD-10-CM | POA: Diagnosis not present

## 2023-01-14 ENCOUNTER — Ambulatory Visit: Payer: Medicare PPO | Admitting: Internal Medicine

## 2023-01-14 DIAGNOSIS — N1831 Chronic kidney disease, stage 3a: Secondary | ICD-10-CM | POA: Diagnosis not present

## 2023-01-14 DIAGNOSIS — M069 Rheumatoid arthritis, unspecified: Secondary | ICD-10-CM | POA: Diagnosis not present

## 2023-01-14 DIAGNOSIS — E1159 Type 2 diabetes mellitus with other circulatory complications: Secondary | ICD-10-CM | POA: Diagnosis not present

## 2023-01-14 DIAGNOSIS — J45909 Unspecified asthma, uncomplicated: Secondary | ICD-10-CM | POA: Diagnosis not present

## 2023-01-14 DIAGNOSIS — I152 Hypertension secondary to endocrine disorders: Secondary | ICD-10-CM | POA: Diagnosis not present

## 2023-01-14 DIAGNOSIS — G8929 Other chronic pain: Secondary | ICD-10-CM | POA: Diagnosis not present

## 2023-01-14 DIAGNOSIS — S76111D Strain of right quadriceps muscle, fascia and tendon, subsequent encounter: Secondary | ICD-10-CM | POA: Diagnosis not present

## 2023-01-14 DIAGNOSIS — E1122 Type 2 diabetes mellitus with diabetic chronic kidney disease: Secondary | ICD-10-CM | POA: Diagnosis not present

## 2023-01-14 DIAGNOSIS — I4891 Unspecified atrial fibrillation: Secondary | ICD-10-CM | POA: Diagnosis not present

## 2023-01-15 ENCOUNTER — Other Ambulatory Visit (HOSPITAL_COMMUNITY): Payer: Self-pay | Admitting: Orthopaedic Surgery

## 2023-01-15 ENCOUNTER — Ambulatory Visit (HOSPITAL_COMMUNITY)
Admission: RE | Admit: 2023-01-15 | Discharge: 2023-01-15 | Disposition: A | Discharge status: Home / Self Care | Payer: Medicare PPO | Source: Ambulatory Visit | Attending: Vascular Surgery | Admitting: Vascular Surgery

## 2023-01-15 DIAGNOSIS — S83411D Sprain of medial collateral ligament of right knee, subsequent encounter: Secondary | ICD-10-CM | POA: Diagnosis not present

## 2023-01-15 DIAGNOSIS — M79604 Pain in right leg: Secondary | ICD-10-CM | POA: Diagnosis not present

## 2023-01-15 DIAGNOSIS — M7989 Other specified soft tissue disorders: Secondary | ICD-10-CM

## 2023-01-21 DIAGNOSIS — M25661 Stiffness of right knee, not elsewhere classified: Secondary | ICD-10-CM | POA: Diagnosis not present

## 2023-01-21 DIAGNOSIS — R262 Difficulty in walking, not elsewhere classified: Secondary | ICD-10-CM | POA: Diagnosis not present

## 2023-01-21 DIAGNOSIS — S76111D Strain of right quadriceps muscle, fascia and tendon, subsequent encounter: Secondary | ICD-10-CM | POA: Diagnosis not present

## 2023-01-21 DIAGNOSIS — M6281 Muscle weakness (generalized): Secondary | ICD-10-CM | POA: Diagnosis not present

## 2023-01-22 DIAGNOSIS — M654 Radial styloid tenosynovitis [de Quervain]: Secondary | ICD-10-CM | POA: Diagnosis not present

## 2023-01-22 DIAGNOSIS — N182 Chronic kidney disease, stage 2 (mild): Secondary | ICD-10-CM | POA: Diagnosis not present

## 2023-01-23 NOTE — Progress Notes (Signed)
Patient Care Team: Deland Pretty, MD as PCP - General (Internal Medicine) Debara Pickett Nadean Corwin, MD as PCP - Cardiology (Cardiology) Nicholas Lose, MD as Consulting Physician (Hematology and Oncology) Lahoma Rocker, MD as Consulting Physician (Rheumatology) Madelin Rear, MD as Consulting Physician (Endocrinology) Marygrace Drought, MD as Consulting Physician (Ophthalmology) Claudia Desanctis, MD as Consulting Physician (Nephrology)  DIAGNOSIS: No diagnosis found.  SUMMARY OF ONCOLOGIC HISTORY: Oncology History   No history exists.    CHIEF COMPLIANT: Follow-up anemia   INTERVAL HISTORY: Jocelyn Kaufman is a 67 y.o. with above-mentioned history of anemia of chronic kidney disease. She presents to the clinic today for a follow-up and to recheck labs.   ALLERGIES:  is allergic to semaglutide(0.25 or 0.5mg -dos), shellfish-derived products, empagliflozin, rho (d) immune globulin, tramadol hcl, canagliflozin, latex, liraglutide, minoxidil, omeprazole, and percocet [oxycodone-acetaminophen].  MEDICATIONS:  Current Outpatient Medications  Medication Sig Dispense Refill   ACCU-CHEK GUIDE test strip      Accu-Chek Softclix Lancets lancets use to check blood sugar     Aflibercept (EYLEA) 2 MG/0.05ML SOLN 2 mg by Intravitreal route every 3 (three) months.     albuterol (VENTOLIN HFA) 108 (90 Base) MCG/ACT inhaler Inhale two puffs every four to six hours as needed for cough or wheeze. (Patient taking differently: Inhale 1-2 puffs into the lungs every 6 (six) hours as needed for wheezing.) 18 g 2   amLODipine (NORVASC) 10 MG tablet Take 1 tablet (10 mg total) by mouth daily.     atorvastatin (LIPITOR) 40 MG tablet Take 1 tablet (40 mg total) by mouth daily at 6 PM. (Patient taking differently: Take 40 mg by mouth daily.) 30 tablet 0   B-D ULTRAFINE III SHORT PEN 31G X 8 MM MISC SMARTSIG:1 Each SUB-Q Daily     CHELATED IRON PO Take 18 mg by mouth daily.     Cholecalciferol (VITAMIN D3) 125 MCG (5000  UT) CAPS Take 5,000 Units by mouth daily.     Dulaglutide (TRULICITY) 1.5 0000000 SOPN Inject 1.5 mg into the skin every Tuesday.     EPINEPHrine (EPIPEN 2-PAK) 0.3 mg/0.3 mL IJ SOAJ injection Use as directed for life-threatening allergic reaction. 2 each 1   epoetin alfa-epbx (RETACRIT) 2000 UNIT/ML injection See admin instructions.     escitalopram (LEXAPRO) 20 MG tablet Take 20 mg by mouth daily.     ezetimibe (ZETIA) 10 MG tablet Take 1 tablet (10 mg total) by mouth daily.     fexofenadine (ALLEGRA ALLERGY) 180 MG tablet Take 1 tablet (180 mg total) by mouth daily.     folic acid (FOLVITE) 1 MG tablet Take 2 mg by mouth daily.     gabapentin (NEURONTIN) 100 MG capsule Take 1 tablet (100mg ) in the morning, 1 tablet (100 mg) in the afternoon, take 3 tablets (300mg ) at bedtime 90 capsule 0   hydrALAZINE (APRESOLINE) 50 MG tablet Take 50 mg by mouth 3 (three) times daily.     hydrocortisone cream 1 % Apply 1 Application topically 2 (two) times daily as needed for itching.     insulin aspart (NOVOLOG) 100 UNIT/ML FlexPen Inject 20 Units into the skin See admin instructions. Inject 20 units into the skin two to three times a day before meals     insulin glargine, 2 Unit Dial, (TOUJEO MAX SOLOSTAR) 300 UNIT/ML Solostar Pen Inject 46 Units into the skin daily. (Patient taking differently: Inject 42 Units into the skin at bedtime.)     ipratropium (ATROVENT) 0.06 % nasal spray  Place 1-2 sprays each nostril as needed up to 3 times a day (Patient taking differently: Place 1-2 sprays into both nostrils 3 (three) times daily as needed for rhinitis.) 15 mL 5   irbesartan (AVAPRO) 300 MG tablet Take 1 tablet (300 mg total) by mouth daily.     Lancets Misc. (ACCU-CHEK SOFTCLIX LANCET DEV) KIT See admin instructions.     latanoprost (XALATAN) 0.005 % ophthalmic solution Place 1 drop into both eyes at bedtime.     lidocaine (LMX) 4 % cream Apply 1 Application topically as needed (pain).     methocarbamol  (ROBAXIN-750) 750 MG tablet Take 1 tablet (750 mg total) by mouth every 8 (eight) hours as needed for muscle spasms. 30 tablet 0   Methotrexate Sodium (METHOTREXATE, PF,) 50 MG/2ML injection Inject 20 mg into the muscle every Wednesday. 0.8 ml     metoprolol succinate (TOPROL-XL) 25 MG 24 hr tablet Take 1 tablet (25 mg total) by mouth daily. May take additional half tablet as needed once daily for palpitations. (Patient taking differently: Take 12.5-25 mg by mouth See admin instructions. Take 25 mg by mouth once a day and an additional 12.5 mg (half tablet) as needed once daily for palpitations) 135 tablet 3   Multiple Vitamins-Minerals (CENTRUM SILVER PO) Take 1 tablet by mouth daily with breakfast.     predniSONE (DELTASONE) 10 MG tablet Take 10 mg by mouth daily. 5 mg for 14 days     sulfaSALAzine (AZULFIDINE) 500 MG tablet Take 3 tablets (1,500 mg total) by mouth 2 (two) times daily.     SYNTHROID 125 MCG tablet Take 125 mcg by mouth every Monday, Tuesday, Wednesday, Thursday, and Friday.     triamcinolone cream (KENALOG) 0.1 % Apply 1 Application topically 3 (three) times daily as needed (for itching- affected areas).     vitamin B-12 (CYANOCOBALAMIN) 1000 MCG tablet Take 1,000 mcg by mouth daily.     No current facility-administered medications for this visit.    PHYSICAL EXAMINATION: ECOG PERFORMANCE STATUS: {CHL ONC ECOG PS:[id number]}  There were no vitals filed for this visit. There were no vitals filed for this visit.  BREAST:*** No palpable masses or nodules in either right or left breasts. No palpable axillary supraclavicular or infraclavicular adenopathy no breast tenderness or nipple discharge. (exam performed in the presence of a chaperone)  LABORATORY DATA:  I have reviewed the data as listed    Latest Ref Rng & Units 10/04/2022    7:15 AM 10/03/2022    3:21 AM 10/02/2022    4:08 AM  CMP  Glucose 70 - 99 mg/dL 143  134  103   BUN 8 - 23 mg/dL 35  22  17   Creatinine 0.44  - 1.00 mg/dL 1.39  1.45  1.05   Sodium 135 - 145 mmol/L 135  134  131   Potassium 3.5 - 5.1 mmol/L 4.8  4.9  5.0   Chloride 98 - 111 mmol/L 107  98  100   CO2 22 - 32 mmol/L 24  26  25    Calcium 8.9 - 10.3 mg/dL 8.4  8.8  8.5     Lab Results  Component Value Date   WBC 6.0 01/08/2023   HGB 10.5 (L) 01/08/2023   HCT 34.0 (L) 01/08/2023   MCV 92.4 01/08/2023   PLT 198 01/08/2023   NEUTROABS 4.8 01/08/2023    ASSESSMENT & PLAN:  No problem-specific Assessment & Plan notes found for this encounter.    No  orders of the defined types were placed in this encounter.  The patient has a good understanding of the overall plan. she agrees with it. she will call with any problems that may develop before the next visit here. Total time spent: 30 mins including face to face time and time spent for planning, charting and co-ordination of care   Suzzette Righter, [place] 01/23/23    I Gardiner Coins am acting as a Education administrator for [employer]  ***

## 2023-01-28 ENCOUNTER — Other Ambulatory Visit: Payer: Self-pay

## 2023-01-28 DIAGNOSIS — N182 Chronic kidney disease, stage 2 (mild): Secondary | ICD-10-CM | POA: Diagnosis not present

## 2023-01-28 DIAGNOSIS — R809 Proteinuria, unspecified: Secondary | ICD-10-CM | POA: Diagnosis not present

## 2023-01-28 DIAGNOSIS — E1122 Type 2 diabetes mellitus with diabetic chronic kidney disease: Secondary | ICD-10-CM | POA: Diagnosis not present

## 2023-01-28 DIAGNOSIS — D649 Anemia, unspecified: Secondary | ICD-10-CM

## 2023-01-28 DIAGNOSIS — I129 Hypertensive chronic kidney disease with stage 1 through stage 4 chronic kidney disease, or unspecified chronic kidney disease: Secondary | ICD-10-CM | POA: Diagnosis not present

## 2023-01-28 DIAGNOSIS — I10 Essential (primary) hypertension: Secondary | ICD-10-CM | POA: Diagnosis not present

## 2023-01-28 DIAGNOSIS — E1129 Type 2 diabetes mellitus with other diabetic kidney complication: Secondary | ICD-10-CM | POA: Diagnosis not present

## 2023-01-28 DIAGNOSIS — E559 Vitamin D deficiency, unspecified: Secondary | ICD-10-CM | POA: Diagnosis not present

## 2023-01-29 ENCOUNTER — Inpatient Hospital Stay: Payer: Medicare PPO | Admitting: Hematology and Oncology

## 2023-01-29 ENCOUNTER — Other Ambulatory Visit: Payer: Self-pay

## 2023-01-29 ENCOUNTER — Inpatient Hospital Stay: Payer: Medicare PPO | Attending: Hematology and Oncology

## 2023-01-29 VITALS — BP 138/61 | HR 71 | Temp 97.3°F | Resp 18 | Ht 64.5 in | Wt 233.1 lb

## 2023-01-29 DIAGNOSIS — D631 Anemia in chronic kidney disease: Secondary | ICD-10-CM

## 2023-01-29 DIAGNOSIS — N183 Chronic kidney disease, stage 3 unspecified: Secondary | ICD-10-CM

## 2023-01-29 DIAGNOSIS — D649 Anemia, unspecified: Secondary | ICD-10-CM

## 2023-01-29 DIAGNOSIS — N1831 Chronic kidney disease, stage 3a: Secondary | ICD-10-CM | POA: Insufficient documentation

## 2023-01-29 LAB — CBC WITH DIFFERENTIAL (CANCER CENTER ONLY)
Abs Immature Granulocytes: 0.01 10*3/uL (ref 0.00–0.07)
Basophils Absolute: 0 10*3/uL (ref 0.0–0.1)
Basophils Relative: 1 %
Eosinophils Absolute: 0.1 10*3/uL (ref 0.0–0.5)
Eosinophils Relative: 2 %
HCT: 36.6 % (ref 36.0–46.0)
Hemoglobin: 11.2 g/dL — ABNORMAL LOW (ref 12.0–15.0)
Immature Granulocytes: 0 %
Lymphocytes Relative: 25 %
Lymphs Abs: 1.3 10*3/uL (ref 0.7–4.0)
MCH: 27.5 pg (ref 26.0–34.0)
MCHC: 30.6 g/dL (ref 30.0–36.0)
MCV: 89.9 fL (ref 80.0–100.0)
Monocytes Absolute: 0.5 10*3/uL (ref 0.1–1.0)
Monocytes Relative: 10 %
Neutro Abs: 3.4 10*3/uL (ref 1.7–7.7)
Neutrophils Relative %: 62 %
Platelet Count: 200 10*3/uL (ref 150–400)
RBC: 4.07 MIL/uL (ref 3.87–5.11)
RDW: 16.5 % — ABNORMAL HIGH (ref 11.5–15.5)
WBC Count: 5.4 10*3/uL (ref 4.0–10.5)
nRBC: 0 % (ref 0.0–0.2)

## 2023-01-29 NOTE — Assessment & Plan Note (Signed)
Patient has history of hypothyroidism as well as possible adrenal dysfunction issues for which she goes to [facility]   Previous blood work review: 1.  Erythropoietin: 24.2 2. SPEP: No M protein 3.  Haptoglobin: 146, LDH 281, absolute reticulocyte count 96.4, direct Coombs test negative 4.  B12 750 5.  Folic acid [id number] 6.  Ferritin 88, iron saturation 25 7. 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  (Retacrit injections for hemoglobin 10.5 or less) Lab review 03/23/2020: Erythropoietin 27.9, hemoglobin 9.6 02/02/2021: Hemoglobin 10.6 (given her severe symptoms continue with Aranesp) 06/22/2021: Hemoglobin 11.3  11/07/2021: Hemoglobin 11.1 11/21/2021: Hemoglobin 10.5 (no Retacrit today) 01/02/2022: Hemoglobin 10.7 (no Retacrit today): Last Retacrit was on 12/05/2021  02/01/2022: Hemoglobin 10.3: Proceeding to Retacrit today 05/24/2022: Hemoglobin 10.4: Retacrit being given today 08/31/2022: Hemoglobin 10.1 09/19/2022: Hemoglobin 11.2 (Retacrit given 08/31/2022) 12/18/2022: Hemoglobin 10.6 (recommended that she receive Retacrit today) 01/29/2023:   Knee replacement surgery November 2023 spent 7 weeks in rehab.   Recheck labs every 4 weeks, MD visits every 2 months

## 2023-01-30 DIAGNOSIS — M6281 Muscle weakness (generalized): Secondary | ICD-10-CM | POA: Diagnosis not present

## 2023-01-30 DIAGNOSIS — M25661 Stiffness of right knee, not elsewhere classified: Secondary | ICD-10-CM | POA: Diagnosis not present

## 2023-01-30 DIAGNOSIS — S76111D Strain of right quadriceps muscle, fascia and tendon, subsequent encounter: Secondary | ICD-10-CM | POA: Diagnosis not present

## 2023-01-30 DIAGNOSIS — R262 Difficulty in walking, not elsewhere classified: Secondary | ICD-10-CM | POA: Diagnosis not present

## 2023-02-01 ENCOUNTER — Telehealth: Payer: Self-pay

## 2023-02-01 DIAGNOSIS — G4733 Obstructive sleep apnea (adult) (pediatric): Secondary | ICD-10-CM | POA: Diagnosis not present

## 2023-02-01 DIAGNOSIS — R1013 Epigastric pain: Secondary | ICD-10-CM | POA: Diagnosis not present

## 2023-02-01 DIAGNOSIS — N1831 Chronic kidney disease, stage 3a: Secondary | ICD-10-CM | POA: Diagnosis not present

## 2023-02-01 DIAGNOSIS — Z23 Encounter for immunization: Secondary | ICD-10-CM | POA: Diagnosis not present

## 2023-02-01 DIAGNOSIS — I10 Essential (primary) hypertension: Secondary | ICD-10-CM | POA: Diagnosis not present

## 2023-02-01 DIAGNOSIS — E039 Hypothyroidism, unspecified: Secondary | ICD-10-CM | POA: Diagnosis not present

## 2023-02-01 DIAGNOSIS — E1121 Type 2 diabetes mellitus with diabetic nephropathy: Secondary | ICD-10-CM | POA: Diagnosis not present

## 2023-02-01 DIAGNOSIS — Z0000 Encounter for general adult medical examination without abnormal findings: Secondary | ICD-10-CM | POA: Diagnosis not present

## 2023-02-01 NOTE — Telephone Encounter (Signed)
Call to pt reference PREP start on 02/11/23 MW [id number] at [facility]. Confirmed interest and ability to start then. Pt is having some orthopedic issues we will need to work around.  Intake scheduled for 02/06/23 at 10am.  Will meet pt at front desk of [facility].  Confirmed pt has my number for contact.

## 2023-02-04 DIAGNOSIS — L603 Nail dystrophy: Secondary | ICD-10-CM | POA: Diagnosis not present

## 2023-02-04 DIAGNOSIS — L659 Nonscarring hair loss, unspecified: Secondary | ICD-10-CM | POA: Diagnosis not present

## 2023-02-04 DIAGNOSIS — L910 Hypertrophic scar: Secondary | ICD-10-CM | POA: Diagnosis not present

## 2023-02-04 DIAGNOSIS — L821 Other seborrheic keratosis: Secondary | ICD-10-CM | POA: Diagnosis not present

## 2023-02-04 DIAGNOSIS — L720 Epidermal cyst: Secondary | ICD-10-CM | POA: Diagnosis not present

## 2023-02-04 DIAGNOSIS — L700 Acne vulgaris: Secondary | ICD-10-CM | POA: Diagnosis not present

## 2023-02-04 DIAGNOSIS — D229 Melanocytic nevi, unspecified: Secondary | ICD-10-CM | POA: Diagnosis not present

## 2023-02-05 DIAGNOSIS — M0579 Rheumatoid arthritis with rheumatoid factor of multiple sites without organ or systems involvement: Secondary | ICD-10-CM | POA: Diagnosis not present

## 2023-02-06 DIAGNOSIS — R1013 Epigastric pain: Secondary | ICD-10-CM | POA: Diagnosis not present

## 2023-02-06 NOTE — Progress Notes (Signed)
[facility] PREP Evaluation  Patient Details  Name: Jocelyn Kaufman MRN: [id number] Date of Birth: October 18, 1956 Age: 67 y.o. PCP: Merri Brunette, MD  Vitals:   02/06/23 1000  BP: 132/72  SpO2: 98%  Weight: 228 lb 3.2 oz (103.5 kg)     [facility] Eval - 02/06/23 1200       [facility] "PREP" Location   [facility] "PREP" Location [facility]      Referral    Referring Provider walker    Reason for referral Hypertension;Diabetes    Program Start Date 02/11/23   MW [id number] x 12 wks     Measurement   Waist Circumference 46.5 inches    Hip Circumference 53 inches    Body fat 45 percent      Information for Trainer   Goals improve balance, wt loss, build core strength    Current Exercise outpt PT 2 x per week    Orthopedic Concerns Knees, shoulder, back, right arm with pins    Pertinent Medical History HTN, DM, RA, Asthma, CKD2    Current Barriers none    Restrictions/Precautions Fall risk;Diabetic snack before exercise;Assistive device    Medications that affect exercise Medication causing dizziness/drowsiness      Timed Up and Go (TUGS)   Timed Up and Go High risk >13 seconds      Mobility and Daily Activities   I find it easy to walk up or down two or more flights of stairs. 1    I have no trouble taking out the trash. 1    I do housework such as vacuuming and dusting on my own without difficulty. 2    I can easily lift a gallon of milk (8lbs). 2    I can easily walk a mile. 1    I have no trouble reaching into high cupboards or reaching down to pick up something from the floor. 2    I do not have trouble doing out-door work such as Loss adjuster, chartered, raking leaves, or gardening. 1      Mobility and Daily Activities   I feel younger than my age. 2    I feel independent. 4    I feel energetic. 2    I live an active life.  1    I feel strong. 1    I feel healthy. 2    I feel active as other people my age. 2      How fit and strong are you.   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   Urticaria    Past Surgical History:  Procedure Laterality Date   ADENOIDECTOMY     APPENDECTOMY     biopsy Left 12/2021   shin   biospy Right 04/2021   R  5th digit   biospy Left 04/2021   foot plantar surface   colonoscopy and endoscopy  2021   CYST REMOVAL LEG Right    Removed from right thigh joint   EYE SURGERY Bilateral 09/2021   cataract   foot surgery Left 09/2021   Excision lesion plantar left foot   LIGAMENT REPAIR Right 09/26/2022   Procedure: LIGAMENT REPAIR/knee;  Surgeon: Bjorn Pippin, MD;  Location: [facility] OR;  Service: Orthopedics;  Laterality: Right;   ORIF HUMERUS FRACTURE Right 07/09/2018   Procedure: OPEN REDUCTION INTERNAL FIXATION (ORIF) HUMERAL SHAFT FRACTURE;  Surgeon: Bjorn Pippin, MD;  Location: [facility] OR;  Service: Orthopedics;  Laterality: Right;   QUADRICEPS TENDON REPAIR Right 09/26/2022   Procedure: REVISION REPAIR QUADRICEP TENDON;  Surgeon: Bjorn Pippin, MD;  Location: [facility] OR;  Service: Orthopedics;  Laterality: Right;   TONSILLECTOMY     torn right knee tendon repair Right    2023   TOTAL VAGINAL HYSTERECTOMY     UMBILICAL HERNIA REPAIR     Social History   Tobacco Use  Smoking Status Never  Smokeless Tobacco Never    [name] 02/06/2023, 12:18 PM

## 2023-02-07 DIAGNOSIS — M6281 Muscle weakness (generalized): Secondary | ICD-10-CM | POA: Diagnosis not present

## 2023-02-07 DIAGNOSIS — R262 Difficulty in walking, not elsewhere classified: Secondary | ICD-10-CM | POA: Diagnosis not present

## 2023-02-07 DIAGNOSIS — M25661 Stiffness of right knee, not elsewhere classified: Secondary | ICD-10-CM | POA: Diagnosis not present

## 2023-02-07 DIAGNOSIS — S76111D Strain of right quadriceps muscle, fascia and tendon, subsequent encounter: Secondary | ICD-10-CM | POA: Diagnosis not present

## 2023-02-11 DIAGNOSIS — M25661 Stiffness of right knee, not elsewhere classified: Secondary | ICD-10-CM | POA: Diagnosis not present

## 2023-02-11 DIAGNOSIS — M6281 Muscle weakness (generalized): Secondary | ICD-10-CM | POA: Diagnosis not present

## 2023-02-11 DIAGNOSIS — R262 Difficulty in walking, not elsewhere classified: Secondary | ICD-10-CM | POA: Diagnosis not present

## 2023-02-11 DIAGNOSIS — S76111D Strain of right quadriceps muscle, fascia and tendon, subsequent encounter: Secondary | ICD-10-CM | POA: Diagnosis not present

## 2023-02-12 DIAGNOSIS — N182 Chronic kidney disease, stage 2 (mild): Secondary | ICD-10-CM | POA: Diagnosis not present

## 2023-02-13 ENCOUNTER — Other Ambulatory Visit (HOSPITAL_COMMUNITY): Payer: Self-pay | Admitting: Gastroenterology

## 2023-02-13 DIAGNOSIS — R1011 Right upper quadrant pain: Secondary | ICD-10-CM

## 2023-02-13 DIAGNOSIS — R634 Abnormal weight loss: Secondary | ICD-10-CM | POA: Diagnosis not present

## 2023-02-13 DIAGNOSIS — R1013 Epigastric pain: Secondary | ICD-10-CM | POA: Diagnosis not present

## 2023-02-13 DIAGNOSIS — R1012 Left upper quadrant pain: Secondary | ICD-10-CM | POA: Diagnosis not present

## 2023-02-14 DIAGNOSIS — S76111D Strain of right quadriceps muscle, fascia and tendon, subsequent encounter: Secondary | ICD-10-CM | POA: Diagnosis not present

## 2023-02-14 DIAGNOSIS — R262 Difficulty in walking, not elsewhere classified: Secondary | ICD-10-CM | POA: Diagnosis not present

## 2023-02-14 DIAGNOSIS — M6281 Muscle weakness (generalized): Secondary | ICD-10-CM | POA: Diagnosis not present

## 2023-02-14 DIAGNOSIS — M25661 Stiffness of right knee, not elsewhere classified: Secondary | ICD-10-CM | POA: Diagnosis not present

## 2023-02-14 NOTE — Progress Notes (Signed)
[facility] PREP Weekly Session  Patient Details  Name: Jocelyn Kaufman MRN: [id number] Date of Birth: [birth year] Age: 67 y.o. PCP: Merri Brunette, MD  There were no vitals filed for this visit.   [facility] Weekly seesion - 02/14/23 1500       [facility] "PREP" Location   [facility] "PREP" Location [facility]      Weekly Session   Topic Discussed Goal setting and welcome to the program   fit testing   Classes attended to date 2             [name] 02/14/2023, 3:50 PM

## 2023-02-15 ENCOUNTER — Encounter (HOSPITAL_BASED_OUTPATIENT_CLINIC_OR_DEPARTMENT_OTHER): Payer: Self-pay | Admitting: Family

## 2023-02-15 ENCOUNTER — Telehealth (HOSPITAL_BASED_OUTPATIENT_CLINIC_OR_DEPARTMENT_OTHER): Payer: Self-pay | Admitting: Family

## 2023-02-15 ENCOUNTER — Ambulatory Visit (HOSPITAL_BASED_OUTPATIENT_CLINIC_OR_DEPARTMENT_OTHER): Payer: Medicare PPO | Admitting: Family

## 2023-02-15 VITALS — BP 138/72 | HR 58 | Ht 64.5 in | Wt 230.0 lb

## 2023-02-15 DIAGNOSIS — I4729 Other ventricular tachycardia: Secondary | ICD-10-CM | POA: Diagnosis not present

## 2023-02-15 DIAGNOSIS — G4733 Obstructive sleep apnea (adult) (pediatric): Secondary | ICD-10-CM

## 2023-02-15 DIAGNOSIS — I10 Essential (primary) hypertension: Secondary | ICD-10-CM

## 2023-02-15 DIAGNOSIS — D638 Anemia in other chronic diseases classified elsewhere: Secondary | ICD-10-CM | POA: Diagnosis not present

## 2023-02-15 DIAGNOSIS — R600 Localized edema: Secondary | ICD-10-CM

## 2023-02-15 DIAGNOSIS — R0609 Other forms of dyspnea: Secondary | ICD-10-CM

## 2023-02-15 MED ORDER — CHLORTHALIDONE 25 MG PO TABS
12.5000 mg | ORAL_TABLET | Freq: Every day | ORAL | 2 refills | Status: DC
Start: 2023-02-15 — End: 2023-06-21

## 2023-02-15 NOTE — Progress Notes (Unsigned)
Office Visit    Patient Name: Jocelyn Kaufman Date of Encounter: 02/15/2023  PCP:  Merri Brunette, MD   [facility]  Cardiologist:  Chrystie Nose, MD  Advanced Practice Provider:  No care team member to display Electrophysiologist:  None    Chief Complaint    Jocelyn Kaufman is a 67 y.o. female for follow up of PVC   Past Medical History    Past Medical History:  Diagnosis Date   Adrenal adenoma    Anemia    Angio-edema    Asthma    Atrial fibrillation    Chronic kidney disease    stage II   Cushing disease    per patient pseudo cushing   Depression    Diabetes mellitus without complication    Fibromyalgia    Hyperlipidemia    Hypertension    Hypothyroidism    Meralgia paresthetica of right side 01/10/2017   Migraine    Pericardial effusion    Proteinuria    Rheumatoid arthritis    Ulnar neuropathy at wrist, 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   Urticaria    Past Surgical History:  Procedure Laterality Date   ADENOIDECTOMY     APPENDECTOMY     biopsy Left 12/2021   shin   biospy Right 04/2021   R 5th digit   biospy Left 04/2021   foot plantar surface   colonoscopy and endoscopy  2021   CYST REMOVAL LEG Right    Removed from right thigh joint   EYE SURGERY Bilateral 09/2021   cataract   foot surgery Left 09/2021   Excision lesion plantar left foot   LIGAMENT REPAIR Right 09/26/2022   Procedure: LIGAMENT REPAIR/knee;  Surgeon: Bjorn Pippin, MD;  Location: [facility] OR;  Service: Orthopedics;  Laterality: Right;   ORIF HUMERUS FRACTURE Right 07/09/2018   Procedure: OPEN REDUCTION INTERNAL FIXATION (ORIF) HUMERAL SHAFT FRACTURE;  Surgeon: Bjorn Pippin, MD;  Location: [facility] OR;  Service: Orthopedics;  Laterality: Right;   QUADRICEPS TENDON REPAIR Right 09/26/2022   Procedure: REVISION REPAIR QUADRICEP TENDON;  Surgeon: Bjorn Pippin, MD;  Location: [facility] OR;  Service: Orthopedics;  Laterality: Right;   TONSILLECTOMY     torn right knee tendon repair Right     2023   TOTAL VAGINAL HYSTERECTOMY     UMBILICAL HERNIA REPAIR      Allergies  Allergies  Allergen Reactions   Semaglutide(0.25 Or 0.5mg -Dos) Other (See Comments)    Causes cataracts requiring surgery and impairing vision    Shellfish-Derived Products Anaphylaxis, Swelling and Other (See Comments)    Patient avoids   Empagliflozin Itching, Swelling and Other (See Comments)    UTI sx's and yeast infections   Rho (D) Immune Globulin      Not available   Tramadol Hcl Other (See Comments)    hallucinations   Canagliflozin Other (See Comments)    Urinary incontinence    Latex Rash   Liraglutide Itching    Other reaction(s): itching all over   Minoxidil Rash    skin irritation   Omeprazole Rash   Percocet [Oxycodone-Acetaminophen] Nausea And Vomiting    History of Present Illness    Jocelyn Kaufman is a 67 y.o. 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 In occurred after a fall in her yard and was thought to be musculoskeletal in natrua. No ischemic evaluation recommended.   At last visit 05/07/22 noted exertional dyspnea with subsequent echocardiogram 04/2022 normal LVEF 55-60%, moderate to severe mitral calcification with mild MR. Myoview 07/31/22 low risk study with no ischemia.   Admitted 08/20/22 with PE after quadriceps tendon repair 2 weeks prior. Treated with OAC for 3 months. Echo during admission 07/2022 LVEF 45-50%, LV global hypokinesis, mild MR, aortic sclerosis without stenosis.   She presents today for follow up independently. She has recently started PREP exercise program. Reports low  energy level which she attributes to anemia. Still notes exertional dyspnea which she attributes to rheumatoid arthritis. Notes allergy symptoms have been overall well controlled. No orthopnea. Reports very rare episodes of PND. Reports both knees, legs, feet have had some swelling. Dr. Malen Gauze of nephrology asked her to try HCTZ daily and she felt it was causing urinary frequency. BP checked intermittently at home with readings 130s-140s.   BMP 02/12/23 creatinine 1.07, K 4.6, GFR 57   EKGs/Labs/Other Studies Reviewed:   The following studies were reviewed today:   Cardiac Studies & Procedures     STRESS TESTS  MYOCARDIAL PERFUSION IMAGING 07/31/2022  Narrative   The study is normal. The study is low risk.   No ST deviation was noted.   LV perfusion is normal. There is no evidence of ischemia. There is no evidence of infarction.   Left ventricular function is normal. Nuclear stress EF: 55 %. The left ventricular ejection fraction is normal (55-65%). End diastolic cavity size is normal. End systolic cavity size is normal.   Prior study available for comparison from 10/16/2018.  Breast attenuation no ischemia normal EF 55%   ECHOCARDIOGRAM  ECHOCARDIOGRAM COMPLETE 08/21/2022  Narrative ECHOCARDIOGRAM REPORT    Patient Name:   Jocelyn Kaufman Date of Exam: 08/21/2022 Medical Rec #:  [id number]    Height:       64.5 in Accession #:    [id number]   Weight:       238.5 lb Date of Birth:  03/19/1956    BSA:          2.120 m Patient Age:    66 years     BP:           134/73 mmHg Patient Gender: F            HR:           63 bpm. Exam Location:  Inpatient  Procedure: 2D Echo, Cardiac Doppler and Color Doppler  Indications:    Pulmonary Embolus I26.09  History:        Patient has prior history of Echocardiogram examinations, most recent 05/18/2022. Arrythmias:Atrial Fibrillation; Risk Factors:Hypertension, Dyslipidemia and Diabetes.  Sonographer:    Eulah Pont RDCS Referring  Phys: [id number] CAROLE N HALL  IMPRESSIONS   1. Left ventricular ejection fraction, by estimation, is 45 to 50%. Left ventricular ejection fraction by 2D MOD biplane is 49.1 %. The left ventricle has mildly decreased function. The left ventricle demonstrates global hypokinesis. Indeterminate diastolic filling due to E-A fusion. 2. Right ventricular systolic function is normal. The right ventricular size is normal. Tricuspid regurgitation signal is inadequate for assessing PA pressure. 3. A small pericardial effusion is present. The pericardial effusion is circumferential. 4. The mitral valve is degenerative. Mild mitral valve regurgitation. No  evidence of mitral stenosis. 5. The aortic valve is tricuspid. There is mild calcification of the aortic valve. There is mild thickening of the aortic valve. Aortic valve regurgitation is not visualized. Aortic valve sclerosis/calcification is present, without any evidence of aortic stenosis. 6. The inferior vena cava is normal in size with greater than 50% respiratory variability, suggesting right atrial pressure of 3 mmHg.  Comparison(s): Changes from prior study are noted. EF mildly reduced on this study.  FINDINGS Left Ventricle: Left ventricular ejection fraction, by estimation, is 45 to 50%. Left ventricular ejection fraction by 2D MOD biplane is 49.1 %. The left ventricle has mildly decreased function. The left ventricle demonstrates global hypokinesis. The left ventricular internal cavity size was normal in size. There is no left ventricular hypertrophy. Indeterminate diastolic filling due to E-A fusion.  Right Ventricle: The right ventricular size is normal. No increase in right ventricular wall thickness. Right ventricular systolic function is normal. Tricuspid regurgitation signal is inadequate for assessing PA pressure.  Left Atrium: Left atrial size was normal in size.  Right Atrium: Right atrial size was normal in size.  Pericardium: A  small pericardial effusion is present. The pericardial effusion is circumferential.  Mitral Valve: The mitral valve is degenerative in appearance. Mild to moderate mitral annular calcification. Mild mitral valve regurgitation. No evidence of mitral valve stenosis.  Tricuspid Valve: The tricuspid valve is grossly normal. Tricuspid valve regurgitation is trivial. No evidence of tricuspid stenosis.  Aortic Valve: The aortic valve is tricuspid. There is mild calcification of the aortic valve. There is mild thickening of the aortic valve. Aortic valve regurgitation is not visualized. Aortic valve sclerosis/calcification is present, without any evidence of aortic stenosis.  Pulmonic Valve: The pulmonic valve was grossly normal. Pulmonic valve regurgitation is trivial. No evidence of pulmonic stenosis.  Aorta: The aortic root and ascending aorta are structurally normal, with no evidence of dilitation.  Venous: The inferior vena cava is normal in size with greater than 50% respiratory variability, suggesting right atrial pressure of 3 mmHg.  IAS/Shunts: The atrial septum is grossly normal.   LEFT VENTRICLE PLAX 2D                        Biplane EF (MOD) LVIDd:         5.60 cm         LV Biplane EF:   Left LVIDs:         4.20 cm                          ventricular LV PW:         1.10 cm                          ejection LV IVS:        1.00 cm                          fraction by LVOT diam:     2.10 cm                          2D MOD LV SV:         114  biplane is LV SV Index:   54                               49.1 %. LVOT Area:     3.46 cm Diastology LV e' medial:    4.33 cm/s LV Volumes (MOD)               LV E/e' medial:  34.9 LV vol d, MOD    93.6 ml       LV e' lateral:   3.83 cm/s A2C:                           LV E/e' lateral: 39.4 LV vol d, MOD    101.0 ml A4C: LV vol s, MOD    46.8 ml A2C: LV vol s, MOD    52.0 ml A4C: LV SV MOD A2C:   46.8 ml LV  SV MOD A4C:   101.0 ml LV SV MOD BP:    47.8 ml  RIGHT VENTRICLE RV S prime:     9.83 cm/s TAPSE (M-mode): 1.9 cm  LEFT ATRIUM             Index        RIGHT ATRIUM           Index LA diam:        4.60 cm 2.17 cm/m   RA Area:     14.90 cm LA Vol (A2C):   68.3 ml 32.22 ml/m  RA Volume:   30.30 ml  14.29 ml/m LA Vol (A4C):   64.8 ml 30.57 ml/m LA Biplane Vol: 67.7 ml 31.94 ml/m AORTIC VALVE              PULMONIC VALVE LVOT Vmax:   159.00 cm/s  PR End Diast Vel: 4.16 msec LVOT Vmean:  105.000 cm/s LVOT VTI:    0.330 m  AORTA Ao Root diam: 2.90 cm Ao Asc diam:  3.50 cm  MITRAL VALVE MV Area (PHT): 4.60 cm     SHUNTS MV Decel Time: 165 msec     Systemic VTI:  0.33 m MV E velocity: 151.00 cm/s  Systemic Diam: 2.10 cm MV A velocity: 34.50 cm/s MV E/A ratio:  4.38  Lennie Odor MD Electronically signed by Lennie Odor MD Signature Date/Time: 08/21/2022/10:35:22 AM    Final    MONITORS  CARDIAC EVENT MONITOR 09/09/2018             EKG:  EKG is  ordered today.  The ekg ordered today demonstrates SB 58 bpm and no acute ST/T wave changes.  Recent Labs: 08/20/2022: B Natriuretic Peptide 186.3 08/21/2022: Magnesium 1.9 08/31/2022: ALT 37 10/04/2022: BUN 35; Creatinine, Ser 1.39; Potassium 4.8; Sodium 135 01/29/2023: Hemoglobin 11.2; Platelet Count 200  Recent Lipid Panel    Component Value Date/Time   CHOL 221 (H) 07/07/2018 0514   TRIG 266 (H) 07/07/2018 0514   HDL 42 07/07/2018 0514   CHOLHDL 5.3 07/07/2018 0514   VLDL 53 (H) 07/07/2018 0514   LDLCALC 126 (H) 07/07/2018 0514    Home Medications   Current Meds  Medication Sig   ACCU-CHEK GUIDE test strip    Accu-Chek Softclix Lancets lancets use to check blood sugar   Aflibercept (EYLEA) 2 MG/0.05ML SOLN 2 mg by Intravitreal route every 3 (three) months.   albuterol (VENTOLIN HFA) 108 (90 Base) MCG/ACT inhaler Inhale two puffs  every four to six hours as needed for cough or wheeze. (Patient taking  differently: Inhale 1-2 puffs into the lungs every 6 (six) hours as needed for wheezing.)   amLODipine (NORVASC) 10 MG tablet Take 1 tablet (10 mg total) by mouth daily.   atorvastatin (LIPITOR) 40 MG tablet Take 1 tablet (40 mg total) by mouth daily at 6 PM. (Patient taking differently: Take 40 mg by mouth daily.)   B-D ULTRAFINE III SHORT PEN 31G X 8 MM MISC SMARTSIG:1 Each SUB-Q Daily   CHELATED IRON PO Take 18 mg by mouth daily.   Cholecalciferol (VITAMIN D3) 125 MCG (5000 UT) CAPS Take 5,000 Units by mouth daily.   Dulaglutide (TRULICITY) 1.5 MG/0.5ML SOPN Inject 1.5 mg into the skin every Tuesday.   EPINEPHrine (EPIPEN 2-PAK) 0.3 mg/0.3 mL IJ SOAJ injection Use as directed for life-threatening allergic reaction.   epoetin alfa-epbx (RETACRIT) 2000 UNIT/ML injection See admin instructions.   escitalopram (LEXAPRO) 20 MG tablet Take 20 mg by mouth daily.   ezetimibe (ZETIA) 10 MG tablet Take 1 tablet (10 mg total) by mouth daily.   fexofenadine (ALLEGRA ALLERGY) 180 MG tablet Take 1 tablet (180 mg total) by mouth daily.   folic acid (FOLVITE) 1 MG tablet Take 2 mg by mouth daily.   hydrALAZINE (APRESOLINE) 50 MG tablet Take 50 mg by mouth 3 (three) times daily.   hydrochlorothiazide (MICROZIDE) 12.5 MG capsule Take 12.5 mg by mouth daily.   hydrocortisone cream 1 % Apply 1 Application topically 2 (two) times daily as needed for itching.   insulin aspart (NOVOLOG) 100 UNIT/ML FlexPen Inject 20 Units into the skin See admin instructions. Inject 20 units into the skin two to three times a day before meals   insulin glargine, 2 Unit Dial, (TOUJEO MAX SOLOSTAR) 300 UNIT/ML Solostar Pen Inject 46 Units into the skin daily. (Patient taking differently: Inject 42 Units into the skin at bedtime.)   irbesartan (AVAPRO) 300 MG tablet Take 1 tablet (300 mg total) by mouth daily.   Lancets Misc. (ACCU-CHEK SOFTCLIX LANCET DEV) KIT See admin instructions.   latanoprost (XALATAN) 0.005 % ophthalmic solution  Place 1 drop into both eyes at bedtime.   lidocaine (LMX) 4 % cream Apply 1 Application topically as needed (pain).   Methotrexate Sodium (METHOTREXATE, PF,) 50 MG/2ML injection Inject 20 mg into the muscle every Wednesday. 0.8 ml   metoprolol succinate (TOPROL-XL) 25 MG 24 hr tablet Take 1 tablet (25 mg total) by mouth daily. May take additional half tablet as needed once daily for palpitations. (Patient taking differently: Take 12.5-25 mg by mouth See admin instructions. Take 25 mg by mouth once a day and an additional 12.5 mg (half tablet) as needed once daily for palpitations)   Multiple Vitamins-Minerals (CENTRUM SILVER PO) Take 1 tablet by mouth daily with breakfast.   sulfaSALAzine (AZULFIDINE) 500 MG tablet Take 3 tablets (1,500 mg total) by mouth 2 (two) times daily.   SYNTHROID 125 MCG tablet Take 125 mcg by mouth every Monday, Tuesday, Wednesday, Thursday, and Friday.   triamcinolone cream (KENALOG) 0.1 % Apply 1 Application topically 3 (three) times daily as needed (for itching- affected areas).   vitamin B-12 (CYANOCOBALAMIN) 1000 MCG tablet Take 1,000 mcg by mouth daily.     Review of Systems      All other systems reviewed and are otherwise negative except as noted above.  Physical Exam    VS:  BP 138/72   Pulse (!) 58   Ht 5'  4.5" (1.638 m)   Wt 230 lb (104.3 kg)   BMI 38.87 kg/m  , BMI Body mass index is 38.87 kg/m.  Wt Readings from Last 3 Encounters:  02/15/23 230 lb (104.3 kg)  02/06/23 228 lb 3.2 oz (103.5 kg)  01/29/23 233 lb 1.6 oz (105.7 kg)    GEN: Well nourished, overweight,  well developed, in no acute distress. HEENT: normal. Neck: Supple, no JVD, carotid bruits, or masses. Cardiac: RRR, no murmurs, rubs, or gallops. No clubbing, cyanosis, edema.  Radials/PT 2+ and equal bilaterally.  Respiratory:  Respirations regular and unlabored, clear to auscultation bilaterally. GI: Soft, nontender, nondistended. MS: No deformity or atrophy. Skin: Warm and dry,  no rash. Neuro:  Strength and sensation are intact. Psych: Normal affect.  Assessment & Plan    HFmrEF - Echo 07/2022 LVEF 45-50% in setting of PE. Update echocardiogram to reassess LVEF. Low sodium diet, fluid restriction <2L, and daily weights encouraged. Educated to contact our office for weight gain of 2 lbs overnight or 5 lbs in one week.   NSVT / PVC - Well controlled on Toprol 25mg  QD.  She may take additional half tablet as needed for breakthrough palpitations.   DM2 - Continue to follow with PCP.  Coronary artery calcification on CT - Inadvertent finding. Myoview 07/2022 low risk. GDMT aspirin, atorvatatin, metoprolol.   HTN - BP not at goal <130/80. Did not tolerate HCTZ daily due to urinary frequency. Rx Chlorthalidone 12.5mg  QD. Also will hopefully help with edema.  OSA - CPAP compliance encouraged. She is wearing regularly.   Anemia of chronic disease -follows with hematology.    Obesity - Weight loss via diet and exercise encouraged. Discussed the impact being overweight would have on cardiovascular risk.  Participating in [employer] Exercise program.  Disposition: Follow up in 3 months with Dr. Rennis Golden or APP.  Signed, Alver Sorrow, NP 02/15/2023, 11:39 AM [facility]

## 2023-02-15 NOTE — Patient Instructions (Addendum)
Medication Instructions:  Your physician has recommended you make the following change in your medication:    STOP Hydrochlorothiazide   START Chlorthalidone half tablet once per day in the morning  *If you need a refill on your cardiac medications before your next appointment, please call your pharmacy*  Lab Work: We will request hematology team collect BMP at your upcoming visit. You do NOT need to fast prior this lab.  If you have labs (blood work) drawn today and your tests are completely normal, you will receive your results only by: MyChart Message (if you have MyChart) OR A paper copy in the mail If you have any lab test that is abnormal or we need to change your treatment, we will call you to review the results.   Testing/Procedures: Your physician has requested that you have an echocardiogram. Echocardiography is a painless test that uses sound waves to create images of your heart. It provides your doctor with information about the size and shape of your heart and how well your heart's chambers and valves are working. This procedure takes approximately one hour. There are no restrictions for this procedure. Please do NOT wear cologne, perfume, aftershave, or lotions (deodorant is allowed). Please arrive 15 minutes prior to your appointment time.   If you develop new chest pain with activity, we would consider a test called a cardiac CTA which gives [place] a 3D model of all your heart arteries as well as measures any plaque and blood flow through those arteries.   Follow-Up: At [facility], you and your health needs are our priority.  As part of our continuing mission to provide you with exceptional heart care, we have created designated Provider Care Teams.  These Care Teams include your primary Cardiologist (physician) and Advanced Practice Providers (APPs -  Physician Assistants and Nurse Practitioners) who all work together to provide you with the care you need, when you  need it.  We recommend signing up for the patient portal called "MyChart".  Sign up information is provided on this After Visit Summary.  MyChart is used to connect with patients for Virtual Visits (Telemedicine).  Patients are able to view lab/test results, encounter notes, upcoming appointments, etc.  Non-urgent messages can be sent to your provider as well.   To learn more about what you can do with MyChart, go to [email or web address].    Your next appointment:   2 month(s)  Provider:   K. [place] Hilty, MD or Gillian Shields, NP    Other Instructions  For coronary artery disease often called "heart disease" we aim for optimal guideline directed medical therapy. We use the "A, B, C"s to help keep [place] on track!  A = Aspirin  daily B = Beta blocker which helps to relax the heart. This is your Metoprolol. C = Cholesterol control. You take Atorvastatin to help control your cholesterol.  D = Diet - follow a heart healthy diet. E = Exercise - aim to get 150 minutes of exercise per week  Heart Healthy Diet Recommendations: A low-salt diet is recommended. Meats should be grilled, baked, or boiled. Avoid fried foods. Focus on lean protein sources like fish or chicken with vegetables and fruits. The American Heart Association is a Chief Technology Officer!  American Heart Association Diet and Lifeystyle Recommendations   Exercise recommendations: The American Heart Association recommends 150 minutes of moderate intensity exercise weekly. Try 30 minutes of moderate intensity exercise 4-5 times per week. This could include walking, jogging, or swimming.  To prevent or reduce lower extremity swelling: Eat a low salt diet. Salt makes the body hold onto extra fluid which causes swelling. Sit with legs elevated. For example, in the recliner or on an ottoman.  Wear knee-high compression stockings during the daytime. Ones labeled 15-20 mmHg provide good compression.

## 2023-02-15 NOTE — Telephone Encounter (Signed)
Left message for patient to call and schedule the Echocardiogram and 2 month follow up ordered by Gillian Shields, NP

## 2023-02-18 ENCOUNTER — Other Ambulatory Visit: Payer: Self-pay | Admitting: *Deleted

## 2023-02-18 DIAGNOSIS — S76111D Strain of right quadriceps muscle, fascia and tendon, subsequent encounter: Secondary | ICD-10-CM | POA: Diagnosis not present

## 2023-02-18 DIAGNOSIS — M6281 Muscle weakness (generalized): Secondary | ICD-10-CM | POA: Diagnosis not present

## 2023-02-18 DIAGNOSIS — R262 Difficulty in walking, not elsewhere classified: Secondary | ICD-10-CM | POA: Diagnosis not present

## 2023-02-18 DIAGNOSIS — D631 Anemia in chronic kidney disease: Secondary | ICD-10-CM

## 2023-02-18 DIAGNOSIS — M25661 Stiffness of right knee, not elsewhere classified: Secondary | ICD-10-CM | POA: Diagnosis not present

## 2023-02-18 NOTE — Telephone Encounter (Signed)
Left  message for patient to call and schedule  the echocardiogram and 2 month follow up  awith Gillian Shields or Dr. Rennis Golden

## 2023-02-19 ENCOUNTER — Inpatient Hospital Stay: Payer: Medicare PPO

## 2023-02-19 ENCOUNTER — Other Ambulatory Visit: Payer: Self-pay

## 2023-02-19 VITALS — BP 152/64 | HR 64 | Temp 98.3°F | Resp 18

## 2023-02-19 DIAGNOSIS — D631 Anemia in chronic kidney disease: Secondary | ICD-10-CM | POA: Diagnosis not present

## 2023-02-19 DIAGNOSIS — D638 Anemia in other chronic diseases classified elsewhere: Secondary | ICD-10-CM

## 2023-02-19 DIAGNOSIS — D649 Anemia, unspecified: Secondary | ICD-10-CM

## 2023-02-19 DIAGNOSIS — N1831 Chronic kidney disease, stage 3a: Secondary | ICD-10-CM | POA: Diagnosis not present

## 2023-02-19 LAB — CBC WITH DIFFERENTIAL (CANCER CENTER ONLY)
Abs Immature Granulocytes: 0.02 10*3/uL (ref 0.00–0.07)
Basophils Absolute: 0 10*3/uL (ref 0.0–0.1)
Basophils Relative: 1 %
Eosinophils Absolute: 0.2 10*3/uL (ref 0.0–0.5)
Eosinophils Relative: 3 %
HCT: 35.7 % — ABNORMAL LOW (ref 36.0–46.0)
Hemoglobin: 10.7 g/dL — ABNORMAL LOW (ref 12.0–15.0)
Immature Granulocytes: 0 %
Lymphocytes Relative: 27 %
Lymphs Abs: 1.4 10*3/uL (ref 0.7–4.0)
MCH: 27.5 pg (ref 26.0–34.0)
MCHC: 30 g/dL (ref 30.0–36.0)
MCV: 91.8 fL (ref 80.0–100.0)
Monocytes Absolute: 0.4 10*3/uL (ref 0.1–1.0)
Monocytes Relative: 9 %
Neutro Abs: 3.1 10*3/uL (ref 1.7–7.7)
Neutrophils Relative %: 60 %
Platelet Count: 182 10*3/uL (ref 150–400)
RBC: 3.89 MIL/uL (ref 3.87–5.11)
RDW: 15.9 % — ABNORMAL HIGH (ref 11.5–15.5)
WBC Count: 5.2 10*3/uL (ref 4.0–10.5)
nRBC: 0 % (ref 0.0–0.2)

## 2023-02-19 MED ORDER — EPOETIN ALFA-EPBX 40000 UNIT/ML IJ SOLN
40000.0000 [IU] | Freq: Once | INTRAMUSCULAR | Status: AC
  Administered 2023-02-19: 40000 [IU] via SUBCUTANEOUS
  Filled 2023-02-19: qty 1

## 2023-02-19 NOTE — Progress Notes (Signed)
[facility] PREP Weekly Session  Patient Details  Name: Jocelyn Kaufman MRN: [id number] Date of Birth: [birth year] Age: 67 y.o. PCP: Merri Brunette, MD  Vitals:   02/19/23 1030  Weight: 230 lb (104.3 kg)     [facility] Weekly seesion - 02/19/23 1700       [facility] "PREP" Location   [facility]      Weekly Session   Topic Discussed Importance of resistance training;Other ways to be active    Minutes exercised this week 30 minutes    Classes attended to date 3             [name] 02/19/2023, 5:26 PM

## 2023-02-20 ENCOUNTER — Encounter (HOSPITAL_BASED_OUTPATIENT_CLINIC_OR_DEPARTMENT_OTHER): Payer: Self-pay | Admitting: Family

## 2023-02-20 NOTE — Telephone Encounter (Signed)
Patient called and scheduled appt for echo and 2 mo f/u. Echo 05/23 and 2 mo f/u 06/24.

## 2023-02-21 DIAGNOSIS — M6281 Muscle weakness (generalized): Secondary | ICD-10-CM | POA: Diagnosis not present

## 2023-02-21 DIAGNOSIS — R262 Difficulty in walking, not elsewhere classified: Secondary | ICD-10-CM | POA: Diagnosis not present

## 2023-02-21 DIAGNOSIS — M25661 Stiffness of right knee, not elsewhere classified: Secondary | ICD-10-CM | POA: Diagnosis not present

## 2023-02-21 DIAGNOSIS — S76111D Strain of right quadriceps muscle, fascia and tendon, subsequent encounter: Secondary | ICD-10-CM | POA: Diagnosis not present

## 2023-02-25 DIAGNOSIS — R262 Difficulty in walking, not elsewhere classified: Secondary | ICD-10-CM | POA: Diagnosis not present

## 2023-02-25 DIAGNOSIS — M25661 Stiffness of right knee, not elsewhere classified: Secondary | ICD-10-CM | POA: Diagnosis not present

## 2023-02-25 DIAGNOSIS — S76111D Strain of right quadriceps muscle, fascia and tendon, subsequent encounter: Secondary | ICD-10-CM | POA: Diagnosis not present

## 2023-02-25 DIAGNOSIS — M6281 Muscle weakness (generalized): Secondary | ICD-10-CM | POA: Diagnosis not present

## 2023-02-27 NOTE — Progress Notes (Signed)
[facility] PREP Weekly Session  Patient Details  Name: Jocelyn Kaufman MRN: [id number] Date of Birth: Mar 07, 1956 Age: 67 y.o. PCP: Merri Brunette, MD  Vitals:   02/25/23 1030  Weight: 228 lb (103.4 kg)     [facility] Weekly seesion - 02/27/23 0900       [facility]PREP" Location   [facility]      Weekly Session   Topic Discussed Healthy eating tips   keep added sugars below 24 g   Minutes exercised this week 185 minutes    Classes attended to date 4             Pam Jerral Bonito 02/27/2023, 9:38 AM

## 2023-02-28 ENCOUNTER — Ambulatory Visit (HOSPITAL_COMMUNITY)
Admission: RE | Admit: 2023-02-28 | Discharge: 2023-02-28 | Disposition: A | Discharge status: Home / Self Care | Payer: Medicare PPO | Source: Ambulatory Visit | Attending: Gastroenterology | Admitting: Gastroenterology

## 2023-02-28 DIAGNOSIS — R1011 Right upper quadrant pain: Secondary | ICD-10-CM | POA: Diagnosis not present

## 2023-02-28 DIAGNOSIS — R110 Nausea: Secondary | ICD-10-CM | POA: Diagnosis not present

## 2023-02-28 MED ORDER — TECHNETIUM TC 99M MEBROFENIN IV KIT
5.5000 | PACK | Freq: Once | INTRAVENOUS | Status: AC
  Administered 2023-02-28: 5.5 via INTRAVENOUS

## 2023-03-01 DIAGNOSIS — H401131 Primary open-angle glaucoma, bilateral, mild stage: Secondary | ICD-10-CM | POA: Diagnosis not present

## 2023-03-01 DIAGNOSIS — E119 Type 2 diabetes mellitus without complications: Secondary | ICD-10-CM | POA: Diagnosis not present

## 2023-03-04 DIAGNOSIS — S76111D Strain of right quadriceps muscle, fascia and tendon, subsequent encounter: Secondary | ICD-10-CM | POA: Diagnosis not present

## 2023-03-04 DIAGNOSIS — M6281 Muscle weakness (generalized): Secondary | ICD-10-CM | POA: Diagnosis not present

## 2023-03-04 DIAGNOSIS — R262 Difficulty in walking, not elsewhere classified: Secondary | ICD-10-CM | POA: Diagnosis not present

## 2023-03-04 DIAGNOSIS — M25661 Stiffness of right knee, not elsewhere classified: Secondary | ICD-10-CM | POA: Diagnosis not present

## 2023-03-05 DIAGNOSIS — E039 Hypothyroidism, unspecified: Secondary | ICD-10-CM | POA: Diagnosis not present

## 2023-03-05 DIAGNOSIS — E1121 Type 2 diabetes mellitus with diabetic nephropathy: Secondary | ICD-10-CM | POA: Diagnosis not present

## 2023-03-05 DIAGNOSIS — E1122 Type 2 diabetes mellitus with diabetic chronic kidney disease: Secondary | ICD-10-CM | POA: Diagnosis not present

## 2023-03-05 DIAGNOSIS — E559 Vitamin D deficiency, unspecified: Secondary | ICD-10-CM | POA: Diagnosis not present

## 2023-03-05 DIAGNOSIS — E785 Hyperlipidemia, unspecified: Secondary | ICD-10-CM | POA: Diagnosis not present

## 2023-03-05 DIAGNOSIS — I10 Essential (primary) hypertension: Secondary | ICD-10-CM | POA: Diagnosis not present

## 2023-03-06 ENCOUNTER — Ambulatory Visit: Payer: Medicare PPO | Admitting: Podiatry

## 2023-03-06 ENCOUNTER — Other Ambulatory Visit (HOSPITAL_COMMUNITY): Payer: Self-pay

## 2023-03-06 VITALS — BP 174/82

## 2023-03-06 DIAGNOSIS — M79675 Pain in left toe(s): Secondary | ICD-10-CM | POA: Diagnosis not present

## 2023-03-06 DIAGNOSIS — B351 Tinea unguium: Secondary | ICD-10-CM

## 2023-03-06 DIAGNOSIS — M79674 Pain in right toe(s): Secondary | ICD-10-CM

## 2023-03-06 DIAGNOSIS — M205X1 Other deformities of toe(s) (acquired), right foot: Secondary | ICD-10-CM

## 2023-03-06 DIAGNOSIS — E119 Type 2 diabetes mellitus without complications: Secondary | ICD-10-CM | POA: Diagnosis not present

## 2023-03-06 DIAGNOSIS — E1142 Type 2 diabetes mellitus with diabetic polyneuropathy: Secondary | ICD-10-CM

## 2023-03-06 MED ORDER — TRULICITY 1.5 MG/0.5ML ~~LOC~~ SOAJ
1.5000 mg | SUBCUTANEOUS | 5 refills | Status: DC
  Filled 2023-03-06: qty 6, 84d supply, fill #0
  Filled 2023-08-02: qty 6, 84d supply, fill #1

## 2023-03-06 NOTE — Progress Notes (Signed)
[facility] PREP Weekly Session  Patient Details  Name: Jocelyn Kaufman MRN: [id number] Date of Birth: Mar 28, 1956 Age: 67 y.o. PCP: Merri Brunette, MD  Vitals:   03/04/23 1030  Weight: 229 lb (103.9 kg)     [facility] Weekly seesion - 03/06/23 1500       [facility] "PREP" Location   [facility] Location [facility]      Weekly Session   Topic Discussed Health habits    Minutes exercised this week 150 minutes    Classes attended to date 6           Class held on 03/04/23  [name] 03/06/2023, 3:47 PM

## 2023-03-11 ENCOUNTER — Encounter: Payer: Self-pay | Admitting: Podiatry

## 2023-03-11 DIAGNOSIS — S76111D Strain of right quadriceps muscle, fascia and tendon, subsequent encounter: Secondary | ICD-10-CM | POA: Diagnosis not present

## 2023-03-11 DIAGNOSIS — M25661 Stiffness of right knee, not elsewhere classified: Secondary | ICD-10-CM | POA: Diagnosis not present

## 2023-03-11 DIAGNOSIS — M6281 Muscle weakness (generalized): Secondary | ICD-10-CM | POA: Diagnosis not present

## 2023-03-11 DIAGNOSIS — R262 Difficulty in walking, not elsewhere classified: Secondary | ICD-10-CM | POA: Diagnosis not present

## 2023-03-11 NOTE — Progress Notes (Signed)
ANNUAL DIABETIC FOOT EXAM  Subjective: Jocelyn Kaufman presents today annual diabetic foot exam.  Chief Complaint  Patient presents with   Nail Problem    Rfc-A1C:7.2,PCPs Type   Merri Brunette, MD, B/S today:131,LOV:01/2023    Patient confirms h/o diabetes.  Patient denies any h/o foot wounds.  Patient has been diagnosed with neuropathy.  Risk factors: diabetes, neuropathy, h/o PE, h/o TIA, HTN, hyperlipidemia, diabetic renal disease, RA.  Merri Brunette, MD is patient's PCP.  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   Urticaria    Patient Active Problem List   Diagnosis Date Noted   Physical deconditioning 09/30/2022   ABLA (acute blood loss anemia) 09/30/2022   Quadriceps muscle rupture, right, sequela 09/26/2022   Pulmonary embolus (HCC) 08/20/2022   Acquired trigger finger 05/26/2022   Abnormal reflex 02/20/2022   Anxiety 02/20/2022   Body mass index (BMI) 40.0-44.9, adult (HCC) 02/20/2022   Breast tenderness 02/20/2022   Carotid artery occlusion 02/20/2022   Cataract 02/20/2022   Chronic kidney disease, stage 2 (mild) 02/20/2022   Diabetic renal disease (HCC) 02/20/2022   Diabetic retinopathy associated with type 2 diabetes mellitus (HCC) 02/20/2022   Encounter for general adult medical examination without abnormal findings 02/20/2022   Family history of malignant neoplasm of digestive organs 02/20/2022   Fibromyalgia 02/20/2022   Generalized anxiety disorder 02/20/2022   History  of musculoskeletal disease 02/20/2022   Hypothyroid 02/20/2022   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) 02/20/2022   Morbid obesity (HCC) 02/20/2022   Mild intermittent asthma 02/20/2022   Myalgia 02/20/2022   Neuropathy of both feet 02/20/2022   Non-toxic nodular goiter 02/20/2022   Osteoarthritis of knee 02/20/2022   Obstructive sleep apnea syndrome 02/20/2022   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   Paroxysmal ventricular tachycardia (HCC) 02/20/2022   Postmenopausal state 02/20/2022   Personal history of transient ischemic attack (TIA), 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 deficits 02/20/2022   Recurrent major depression in full remission (HCC) 02/20/2022   Renal failure syndrome 02/20/2022   Sciatica 02/20/2022   Vitamin D deficiency 02/20/2022   Bilateral hearing loss 08/29/2020   Dysequilibrium 08/29/2020   Anemia in chronic kidney disease 06/17/2020   Anemia of chronic disease 04/25/2020   Impingement syndrome of right shoulder region 11/25/2018   Osteoarthritis of right shoulder 11/03/2018   Pain in joint of right shoulder 11/03/2018   Fall at home, initial encounter 07/06/2018   Displaced spiral fracture of shaft of humerus, right arm, initial encounter for closed fracture 07/06/2018   TIA (transient ischemic attack) 07/06/2018   Allergic rhinitis 07/04/2018   Normochromic normocytic anemia 01/20/2018   Impingement syndrome of left shoulder 12/17/2017   Rheumatoid arthritis (HCC) 04/30/2017   Lesion of left humerus 04/29/2017   Adrenal adenoma 03/19/2017   Neuropathy 03/19/2017   Spinal stenosis 03/19/2017   Pericardial effusion 03/14/2017   Diabetes mellitus (  HCC) 03/14/2017   Essential hypertension 03/14/2017   Mixed hyperlipidemia 03/14/2017   Ulnar neuropathy at wrist, 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   Meralgia paresthetica of right side 01/10/2017   History of Cushing disease 03/01/2016   Allergic rhinoconjunctivitis 07/09/2015   Asthma, well  controlled 07/09/2015   Laryngopharyngeal reflux (LPR) 07/09/2015   Past Surgical History:  Procedure Laterality Date   ADENOIDECTOMY     APPENDECTOMY     biopsy Left 12/2021   shin   biospy Right 04/2021   R 5th digit   biospy Left 04/2021   foot plantar surface   colonoscopy and endoscopy  2021   CYST REMOVAL LEG Right    Removed from right thigh joint   EYE SURGERY Bilateral 09/2021   cataract   foot surgery Left 09/2021   Excision lesion plantar left foot   LIGAMENT REPAIR Right 09/26/2022   Procedure: LIGAMENT REPAIR/knee;  Surgeon: Bjorn Pippin, MD;  Location: [facility] OR;  Service: Orthopedics;  Laterality: Right;   ORIF HUMERUS FRACTURE Right 07/09/2018   Procedure: OPEN REDUCTION INTERNAL FIXATION (ORIF) HUMERAL SHAFT FRACTURE;  Surgeon: Bjorn Pippin, MD;  Location: [facility] OR;  Service: Orthopedics;  Laterality: Right;   QUADRICEPS TENDON REPAIR Right 09/26/2022   Procedure: REVISION REPAIR QUADRICEP TENDON;  Surgeon: Bjorn Pippin, MD;  Location: [facility] OR;  Service: Orthopedics;  Laterality: Right;   TONSILLECTOMY     torn right knee tendon repair Right    2023   TOTAL VAGINAL HYSTERECTOMY     UMBILICAL HERNIA REPAIR     Current Outpatient Medications on File Prior to Visit  Medication Sig Dispense Refill   ACCU-CHEK GUIDE test strip      Accu-Chek Softclix Lancets lancets use to check blood sugar     Aflibercept (EYLEA) 2 MG/0.05ML SOLN 2 mg by Intravitreal route every 3 (three) months.     albuterol (VENTOLIN HFA) 108 (90 Base) MCG/ACT inhaler Inhale two puffs every four to six hours as needed for cough or wheeze. (Patient taking differently: Inhale 1-2 puffs into the lungs every 6 (six) hours as needed for wheezing.) 18 g 2   amLODipine (NORVASC) 10 MG tablet Take 1 tablet (10 mg total) by mouth daily.     aspirin EC 81 MG tablet Take 81 mg by mouth daily. Swallow whole.     atorvastatin (LIPITOR) 40 MG tablet Take 1 tablet (40 mg total) by mouth daily at 6 PM. (Patient  taking differently: Take 40 mg by mouth daily.) 30 tablet 0   B-D ULTRAFINE III SHORT PEN 31G X 8 MM MISC SMARTSIG:1 Each SUB-Q Daily     CHELATED IRON PO Take 18 mg by mouth daily.     chlorthalidone (HYGROTON) 25 MG tablet Take 0.5 tablets (12.5 mg total) by mouth daily. 15 tablet 2   Cholecalciferol (VITAMIN D3) 125 MCG (5000 UT) CAPS Take 5,000 Units by mouth daily.     Dulaglutide (TRULICITY) 1.5 MG/0.5ML SOPN Inject 1.5 mg into the skin every Tuesday.     EPINEPHrine (EPIPEN 2-PAK) 0.3 mg/0.3 mL IJ SOAJ injection Use as directed for life-threatening allergic reaction. 2 each 1   epoetin alfa-epbx (RETACRIT) 2000 UNIT/ML injection See admin instructions.     escitalopram (LEXAPRO) 20 MG tablet Take 20 mg by mouth daily.     ezetimibe (ZETIA) 10 MG tablet Take 1 tablet (10 mg total) by mouth daily.     fexofenadine (ALLEGRA ALLERGY) 180 MG tablet Take 1 tablet (180 mg total) by mouth daily.  folic acid (FOLVITE) 1 MG tablet Take 2 mg by mouth daily.     hydrALAZINE (APRESOLINE) 50 MG tablet Take 50 mg by mouth 3 (three) times daily.     hydrocortisone cream 1 % Apply 1 Application topically 2 (two) times daily as needed for itching.     insulin aspart (NOVOLOG) 100 UNIT/ML FlexPen Inject 20 Units into the skin See admin instructions. Inject 20 units into the skin two to three times a day before meals     insulin glargine, 2 Unit Dial, (TOUJEO MAX SOLOSTAR) 300 UNIT/ML Solostar Pen Inject 46 Units into the skin daily. (Patient taking differently: Inject 42 Units into the skin at bedtime.)     ipratropium (ATROVENT) 0.06 % nasal spray Place 1-2 sprays each nostril as needed up to 3 times a day 15 mL 5   irbesartan (AVAPRO) 300 MG tablet Take 1 tablet (300 mg total) by mouth daily.     Lancets Misc. (ACCU-CHEK SOFTCLIX LANCET DEV) KIT See admin instructions.     latanoprost (XALATAN) 0.005 % ophthalmic solution Place 1 drop into both eyes at bedtime.     lidocaine (LMX) 4 % cream Apply 1  Application topically as needed (pain).     Methotrexate Sodium (METHOTREXATE, PF,) 50 MG/2ML injection Inject 20 mg into the muscle every Wednesday. 0.8 ml     metoprolol succinate (TOPROL-XL) 25 MG 24 hr tablet Take 1 tablet (25 mg total) by mouth daily. May take additional half tablet as needed once daily for palpitations. (Patient taking differently: Take 12.5-25 mg by mouth See admin instructions. Take 25 mg by mouth once a day and an additional 12.5 mg (half tablet) as needed once daily for palpitations) 135 tablet 3   Multiple Vitamins-Minerals (CENTRUM SILVER PO) Take 1 tablet by mouth daily with breakfast.     sulfaSALAzine (AZULFIDINE) 500 MG tablet Take 3 tablets (1,500 mg total) by mouth 2 (two) times daily.     SYNTHROID 125 MCG tablet Take 125 mcg by mouth every Monday, Tuesday, Wednesday, Thursday, and Friday.     triamcinolone cream (KENALOG) 0.1 % Apply 1 Application topically 3 (three) times daily as needed (for itching- affected areas).     vitamin B-12 (CYANOCOBALAMIN) 1000 MCG tablet Take 1,000 mcg by mouth daily.     No current facility-administered medications on file prior to visit.    Allergies  Allergen Reactions   Semaglutide(0.25 Or 0.5mg -Dos) Other (See Comments)    Causes cataracts requiring surgery and impairing vision    Shellfish-Derived Products Anaphylaxis, Swelling and Other (See Comments)    Patient avoids   Empagliflozin Itching, Other (See Comments) and Swelling    UTI sx's and yeast infections   Rho (D) Immune Globulin      Not available   Semaglutide Other (See Comments)    Other reaction(s): worsening DM RETINOPATHY   Tramadol Hcl Other (See Comments)    hallucinations   Canagliflozin Other (See Comments)    Urinary incontinence    Latex Rash and Other (See Comments)   Liraglutide Itching    Other reaction(s): itching all over   Minoxidil Rash    skin irritation   Omeprazole Rash and Other (See Comments)   Percocet [Oxycodone-Acetaminophen]  Nausea And Vomiting   Social History   Occupational History   Occupation: [employer]  Tobacco Use   Smoking status: Never   Smokeless tobacco: Never  Vaping Use   Vaping Use: Never used  Substance and Sexual Activity  Alcohol use: Not Currently    Comment: occ   Drug use: No   Sexual activity: Not Currently   Family History  Problem Relation Age of Onset   Hypertension Mother    Stroke Mother    Kidney failure Mother    Heart disease Mother        heart murmur   Glaucoma Mother    Stroke Father    COPD Father    Cancer - Prostate Father    Colon cancer Father    Diabetes Sister    Thyroid cancer Sister    Epilepsy Sister    Ovarian cancer Paternal Aunt    Immunization History  Administered Date(s) Administered   Research officer, [employer] 56yrs & up 12/01/2021     Review of Systems: Negative except as noted in the HPI.   Objective: Vitals:   03/06/23 1547  BP: (!) 174/82    Jocelyn Kaufman is a pleasant 67 y.o. female in NAD. AAO X 3.  Vascular Examination: Capillary refill time immediate b/l. Vascular status intact b/l with palpable pedal pulses. Pedal hair present b/l. No edema. No pain with calf compression b/l. Skin temperature gradient WNL b/l.   Neurological Examination: Sensation grossly intact b/l with 10 gram monofilament. Vibratory sensation intact b/l. Pt has subjective symptoms of neuropathy.  Dermatological Examination: Pedal skin with normal turgor, texture and tone b/l.  No open wounds. No interdigital macerations.   Toenails 1-5 b/l thick, discolored, elongated with subungual debris and pain on dorsal palpation.   Minimal HKT right 5th toe. Toenails 1-5 b/l elongated, discolored, dystrophic, thickened, crumbly with subungual debris and tenderness to dorsal palpation.  Musculoskeletal Examination: Muscle strength 5/5 to all lower extremity muscle groups bilaterally. Adductovarus deformity bilateral 5th  toes.  Radiographs: None  Last A1c:      Latest Ref Rng & Units 08/21/2022    7:56 AM  Hemoglobin A1C  Hemoglobin-A1c 4.8 - 5.6 % 5.6     Lab Results  Component Value Date   HGBA1C 5.6 08/21/2022   ADA Risk Categorization: Low Risk :  Patient has all of the following: Intact protective sensation No prior foot ulcer  No severe deformity Pedal pulses present  Assessment: 1. Pain due to onychomycosis of toenails of both feet   2. Adductovarus rotation of toe, acquired, right   3. Diabetic peripheral neuropathy associated with type 2 diabetes mellitus (HCC)   4. Encounter for diabetic foot exam (HCC)     Plan: -Consent given for treatment as described below: -Examined patient. -Diabetic foot examination performed today. -Mycotic toenails 1-5 bilaterally were debrided in length and girth with sterile nail nippers and dremel without incident. -Patient/POA to call should there be question/concern in the interim. Return in about 3 months (around 06/06/2023).  Freddie Breech, DPM

## 2023-03-12 ENCOUNTER — Other Ambulatory Visit: Payer: Self-pay

## 2023-03-12 ENCOUNTER — Inpatient Hospital Stay: Payer: Medicare PPO

## 2023-03-12 ENCOUNTER — Inpatient Hospital Stay: Payer: Medicare PPO | Attending: Hematology and Oncology

## 2023-03-12 DIAGNOSIS — N183 Chronic kidney disease, stage 3 unspecified: Secondary | ICD-10-CM

## 2023-03-12 DIAGNOSIS — D631 Anemia in chronic kidney disease: Secondary | ICD-10-CM | POA: Diagnosis not present

## 2023-03-12 DIAGNOSIS — N1831 Chronic kidney disease, stage 3a: Secondary | ICD-10-CM | POA: Diagnosis not present

## 2023-03-12 LAB — CBC WITH DIFFERENTIAL (CANCER CENTER ONLY)
Abs Immature Granulocytes: 0.01 10*3/uL (ref 0.00–0.07)
Basophils Absolute: 0 10*3/uL (ref 0.0–0.1)
Basophils Relative: 1 %
Eosinophils Absolute: 0.1 10*3/uL (ref 0.0–0.5)
Eosinophils Relative: 2 %
HCT: 38.4 % (ref 36.0–46.0)
Hemoglobin: 11.5 g/dL — ABNORMAL LOW (ref 12.0–15.0)
Immature Granulocytes: 0 %
Lymphocytes Relative: 27 %
Lymphs Abs: 1.3 10*3/uL (ref 0.7–4.0)
MCH: 27.8 pg (ref 26.0–34.0)
MCHC: 29.9 g/dL — ABNORMAL LOW (ref 30.0–36.0)
MCV: 93 fL (ref 80.0–100.0)
Monocytes Absolute: 0.3 10*3/uL (ref 0.1–1.0)
Monocytes Relative: 7 %
Neutro Abs: 3.1 10*3/uL (ref 1.7–7.7)
Neutrophils Relative %: 63 %
Platelet Count: 190 10*3/uL (ref 150–400)
RBC: 4.13 MIL/uL (ref 3.87–5.11)
RDW: 15.7 % — ABNORMAL HIGH (ref 11.5–15.5)
WBC Count: 4.8 10*3/uL (ref 4.0–10.5)
nRBC: 0 % (ref 0.0–0.2)

## 2023-03-12 NOTE — Progress Notes (Signed)
[facility] PRE[facility] Weekly Session  Patient Details  Name: Jocelyn Kaufman MRN: [id number] Date of Birth: 06/05/1956 Age: 67 y.o. PCP: Merri Brunette, MD  Vitals:   03/11/23 1030  Weight: 229 lb (103.9 kg)     [facility] Weekly seesion - 03/12/23 1200       [facility] "PREP" Location   [facility]      Weekly Session   Topic Discussed Restaurant Eating    Minutes exercised this week 120 minutes    Classes attended to date 8             [name] 03/12/2023, 12:06 PM

## 2023-03-12 NOTE — Progress Notes (Signed)
Pt here for injection.  Hgb 11.5, parameters met & no need for injection today. Gave pt printed lab results.

## 2023-03-14 ENCOUNTER — Telehealth: Payer: Self-pay

## 2023-03-14 DIAGNOSIS — M25569 Pain in unspecified knee: Secondary | ICD-10-CM | POA: Diagnosis not present

## 2023-03-14 DIAGNOSIS — M1711 Unilateral primary osteoarthritis, right knee: Secondary | ICD-10-CM | POA: Diagnosis not present

## 2023-03-14 DIAGNOSIS — D649 Anemia, unspecified: Secondary | ICD-10-CM | POA: Diagnosis not present

## 2023-03-14 DIAGNOSIS — M79643 Pain in unspecified hand: Secondary | ICD-10-CM | POA: Diagnosis not present

## 2023-03-14 DIAGNOSIS — M0579 Rheumatoid arthritis with rheumatoid factor of multiple sites without organ or systems involvement: Secondary | ICD-10-CM | POA: Diagnosis not present

## 2023-03-14 DIAGNOSIS — M25661 Stiffness of right knee, not elsewhere classified: Secondary | ICD-10-CM | POA: Diagnosis not present

## 2023-03-14 DIAGNOSIS — M797 Fibromyalgia: Secondary | ICD-10-CM | POA: Diagnosis not present

## 2023-03-14 DIAGNOSIS — R262 Difficulty in walking, not elsewhere classified: Secondary | ICD-10-CM | POA: Diagnosis not present

## 2023-03-14 DIAGNOSIS — M6281 Muscle weakness (generalized): Secondary | ICD-10-CM | POA: Diagnosis not present

## 2023-03-14 DIAGNOSIS — E1169 Type 2 diabetes mellitus with other specified complication: Secondary | ICD-10-CM | POA: Diagnosis not present

## 2023-03-14 DIAGNOSIS — M1990 Unspecified osteoarthritis, unspecified site: Secondary | ICD-10-CM | POA: Diagnosis not present

## 2023-03-14 DIAGNOSIS — Z79899 Other long term (current) drug therapy: Secondary | ICD-10-CM | POA: Diagnosis not present

## 2023-03-14 DIAGNOSIS — S76111D Strain of right quadriceps muscle, fascia and tendon, subsequent encounter: Secondary | ICD-10-CM | POA: Diagnosis not present

## 2023-03-14 NOTE — Telephone Encounter (Signed)
   Pre-operative Risk Assessment    Patient Name: Jocelyn Kaufman  DOB: Jan 21, 1956 MRN: [id number]      Request for Surgical Clearance    Procedure:   EGD   Date of Surgery:  Clearance 04/09/23                                 Surgeon:  Dr.Hung Surgeon's Group or Practice Name:  [facility], [place] Phone number:  [phone] Fax number:  [phone]   Type of Clearance Requested:   - Medical  - Pharmacy:  Hold Aspirin     Type of Anesthesia:   Propofol   Additional requests/questions:   N/A  Signed, Stevan Born   03/14/2023, 2:25 PM

## 2023-03-18 DIAGNOSIS — R262 Difficulty in walking, not elsewhere classified: Secondary | ICD-10-CM | POA: Diagnosis not present

## 2023-03-18 DIAGNOSIS — M25661 Stiffness of right knee, not elsewhere classified: Secondary | ICD-10-CM | POA: Diagnosis not present

## 2023-03-18 DIAGNOSIS — M6281 Muscle weakness (generalized): Secondary | ICD-10-CM | POA: Diagnosis not present

## 2023-03-18 DIAGNOSIS — S76111D Strain of right quadriceps muscle, fascia and tendon, subsequent encounter: Secondary | ICD-10-CM | POA: Diagnosis not present

## 2023-03-19 DIAGNOSIS — S76111D Strain of right quadriceps muscle, fascia and tendon, subsequent encounter: Secondary | ICD-10-CM | POA: Diagnosis not present

## 2023-03-19 NOTE — Progress Notes (Signed)
[facility] PRE[facility] Weekly Session  Patient Details  Name: Jocelyn Kaufman MRN: [id number] Date of Birth: 1956/09/06 Age: 67 y.o. PCP: Merri Brunette, MD  Vitals:   03/18/23 1030  Weight: 230 lb (104.3 kg)     [facility] Weekly seesion - 03/19/23 1100       [facility] "PREP" Location   [facility]      Weekly Session   Topic Discussed Stress management and problem solving   relaxation meditation   Minutes exercised this week 155 minutes    Classes attended to date 72             Pam Jerral Bonito 03/19/2023, 11:39 AM

## 2023-03-21 ENCOUNTER — Telehealth: Payer: Self-pay | Admitting: *Deleted

## 2023-03-21 ENCOUNTER — Ambulatory Visit (INDEPENDENT_AMBULATORY_CARE_PROVIDER_SITE_OTHER): Payer: Medicare PPO

## 2023-03-21 DIAGNOSIS — R0609 Other forms of dyspnea: Secondary | ICD-10-CM

## 2023-03-21 DIAGNOSIS — R600 Localized edema: Secondary | ICD-10-CM | POA: Diagnosis not present

## 2023-03-21 DIAGNOSIS — I4729 Other ventricular tachycardia: Secondary | ICD-10-CM

## 2023-03-21 DIAGNOSIS — S76111D Strain of right quadriceps muscle, fascia and tendon, subsequent encounter: Secondary | ICD-10-CM | POA: Diagnosis not present

## 2023-03-21 DIAGNOSIS — M6281 Muscle weakness (generalized): Secondary | ICD-10-CM | POA: Diagnosis not present

## 2023-03-21 DIAGNOSIS — M25661 Stiffness of right knee, not elsewhere classified: Secondary | ICD-10-CM | POA: Diagnosis not present

## 2023-03-21 DIAGNOSIS — R262 Difficulty in walking, not elsewhere classified: Secondary | ICD-10-CM | POA: Diagnosis not present

## 2023-03-21 DIAGNOSIS — E113491 Type 2 diabetes mellitus with severe nonproliferative diabetic retinopathy without macular edema, right eye: Secondary | ICD-10-CM | POA: Diagnosis not present

## 2023-03-21 NOTE — Telephone Encounter (Signed)
   Pre-operative Risk Assessment    Patient Name: Jocelyn Kaufman  DOB: [birth year] MRN: [id number]      Request for Surgical Clearance    Procedure:   EGD  Date of Surgery:  Clearance 04/09/23                                 Surgeon:  [facility] Surgeon's Group or Practice Name:  [facility] Phone number:  [phone] Fax number:  [phone]   Type of Clearance Requested:   - Medical  - Pharmacy:  Hold Aspirin Not Indicated.   Type of Anesthesia:   Propofol   Additional requests/questions:  Patient seen Gillian Shields, NP on April 19,2024.  Signed, Emmit Pomfret   03/21/2023, 11:49 AM

## 2023-03-25 LAB — ECHOCARDIOGRAM COMPLETE
Area-P 1/2: 4.6 cm2
MV M vel: 3.31 m/s
MV Peak grad: 43.8 mmHg
MV VTI: 1.86 cm2
S' Lateral: 3.76 cm

## 2023-03-26 ENCOUNTER — Telehealth: Payer: Self-pay | Admitting: Adult Health

## 2023-03-26 NOTE — Telephone Encounter (Signed)
Rescheduled appointment per provider PAL. Patient is aware of the changes made to her upcoming appointments. 

## 2023-03-26 NOTE — Telephone Encounter (Signed)
   Patient Name: Jocelyn Kaufman  DOB: [birth year] MRN: [id number]  Primary Cardiologist: Chrystie Nose, MD  Chart reviewed as part of pre-operative protocol coverage. Given past medical history and time since last visit, based on ACC/AHA guidelines, Jocelyn Kaufman is at acceptable risk for the planned procedure without further cardiovascular testing.  Patient underwent updated 2D echo that showed improved LV systolic function.  The patient was advised that if she develops new symptoms prior to surgery to contact our office to arrange for a follow-up visit, and she verbalized understanding.  Regarding ASA therapy, we recommend continuation of ASA throughout the perioperative period.  However, if the surgeon feels that cessation of ASA is required in the perioperative period, it may be stopped 5-7 days prior to surgery with a plan to resume it as soon as felt to be feasible from a surgical standpoint in the post-operative period.   I will route this recommendation to the requesting party via Epic fax function and remove from pre-op pool.  Please call with questions.  Napoleon Form, Leodis Rains, NP 03/26/2023, 10:12 AM

## 2023-03-28 DIAGNOSIS — R262 Difficulty in walking, not elsewhere classified: Secondary | ICD-10-CM | POA: Diagnosis not present

## 2023-03-28 DIAGNOSIS — E113512 Type 2 diabetes mellitus with proliferative diabetic retinopathy with macular edema, left eye: Secondary | ICD-10-CM | POA: Diagnosis not present

## 2023-03-28 DIAGNOSIS — S76111D Strain of right quadriceps muscle, fascia and tendon, subsequent encounter: Secondary | ICD-10-CM | POA: Diagnosis not present

## 2023-03-28 DIAGNOSIS — M6281 Muscle weakness (generalized): Secondary | ICD-10-CM | POA: Diagnosis not present

## 2023-03-28 DIAGNOSIS — M25661 Stiffness of right knee, not elsewhere classified: Secondary | ICD-10-CM | POA: Diagnosis not present

## 2023-04-01 DIAGNOSIS — R262 Difficulty in walking, not elsewhere classified: Secondary | ICD-10-CM | POA: Diagnosis not present

## 2023-04-01 DIAGNOSIS — S76111D Strain of right quadriceps muscle, fascia and tendon, subsequent encounter: Secondary | ICD-10-CM | POA: Diagnosis not present

## 2023-04-01 DIAGNOSIS — M6281 Muscle weakness (generalized): Secondary | ICD-10-CM | POA: Diagnosis not present

## 2023-04-01 DIAGNOSIS — M25661 Stiffness of right knee, not elsewhere classified: Secondary | ICD-10-CM | POA: Diagnosis not present

## 2023-04-02 ENCOUNTER — Inpatient Hospital Stay: Payer: Medicare PPO

## 2023-04-02 ENCOUNTER — Encounter: Payer: Self-pay | Admitting: Adult Health

## 2023-04-02 ENCOUNTER — Inpatient Hospital Stay: Payer: Medicare PPO | Admitting: Adult Health

## 2023-04-02 ENCOUNTER — Inpatient Hospital Stay: Payer: Medicare PPO | Attending: Hematology and Oncology

## 2023-04-02 ENCOUNTER — Other Ambulatory Visit: Payer: Self-pay

## 2023-04-02 ENCOUNTER — Inpatient Hospital Stay: Payer: Medicare PPO | Admitting: Hematology and Oncology

## 2023-04-02 VITALS — BP 124/40 | HR 71 | Temp 97.5°F | Resp 16 | Ht 64.96 in | Wt 232.1 lb

## 2023-04-02 DIAGNOSIS — M0579 Rheumatoid arthritis with rheumatoid factor of multiple sites without organ or systems involvement: Secondary | ICD-10-CM | POA: Diagnosis not present

## 2023-04-02 DIAGNOSIS — N183 Chronic kidney disease, stage 3 unspecified: Secondary | ICD-10-CM | POA: Diagnosis not present

## 2023-04-02 DIAGNOSIS — D638 Anemia in other chronic diseases classified elsewhere: Secondary | ICD-10-CM

## 2023-04-02 DIAGNOSIS — N182 Chronic kidney disease, stage 2 (mild): Secondary | ICD-10-CM | POA: Insufficient documentation

## 2023-04-02 DIAGNOSIS — E1122 Type 2 diabetes mellitus with diabetic chronic kidney disease: Secondary | ICD-10-CM | POA: Insufficient documentation

## 2023-04-02 DIAGNOSIS — D649 Anemia, unspecified: Secondary | ICD-10-CM

## 2023-04-02 DIAGNOSIS — D631 Anemia in chronic kidney disease: Secondary | ICD-10-CM | POA: Diagnosis not present

## 2023-04-02 LAB — CBC WITH DIFFERENTIAL (CANCER CENTER ONLY)
Abs Immature Granulocytes: 0.02 10*3/uL (ref 0.00–0.07)
Basophils Absolute: 0 10*3/uL (ref 0.0–0.1)
Basophils Relative: 1 %
Eosinophils Absolute: 0.1 10*3/uL (ref 0.0–0.5)
Eosinophils Relative: 2 %
HCT: 35.1 % — ABNORMAL LOW (ref 36.0–46.0)
Hemoglobin: 10.8 g/dL — ABNORMAL LOW (ref 12.0–15.0)
Immature Granulocytes: 0 %
Lymphocytes Relative: 28 %
Lymphs Abs: 1.7 10*3/uL (ref 0.7–4.0)
MCH: 27.8 pg (ref 26.0–34.0)
MCHC: 30.8 g/dL (ref 30.0–36.0)
MCV: 90.5 fL (ref 80.0–100.0)
Monocytes Absolute: 0.4 10*3/uL (ref 0.1–1.0)
Monocytes Relative: 6 %
Neutro Abs: 3.8 10*3/uL (ref 1.7–7.7)
Neutrophils Relative %: 63 %
Platelet Count: 145 10*3/uL — ABNORMAL LOW (ref 150–400)
RBC: 3.88 MIL/uL (ref 3.87–5.11)
RDW: 15.5 % (ref 11.5–15.5)
WBC Count: 6.1 10*3/uL (ref 4.0–10.5)
nRBC: 0 % (ref 0.0–0.2)

## 2023-04-02 MED ORDER — EPOETIN ALFA-EPBX 40000 UNIT/ML IJ SOLN
40000.0000 [IU] | Freq: Once | INTRAMUSCULAR | Status: AC
  Administered 2023-04-02: 40000 [IU] via SUBCUTANEOUS
  Filled 2023-04-02: qty 1

## 2023-04-02 NOTE — Assessment & Plan Note (Signed)
Patient has history of hypothyroidism as well as possible adrenal dysfunction issues for which she goes to [facility]   Previous blood work review: 1.  Erythropoietin: 24.2 2. SPEP: No M protein 3.  Haptoglobin: 146, LDH 281, absolute reticulocyte count 96.4, direct Coombs test negative 4.  B12 750 5.  Folic acid 23.5 6.  Ferritin 88, iron saturation 25 7. Hemoglobin 9.6 with MCV 89  ----------------------------------------------------------------------------------------------------------------------------------------------- Etiology: Anemia of chronic disease secondary to chronic kidney disease, hypothyroidism and adrenal issues   Current treatment: Retacrit every 3 weeks for hemoglobin <11  Anemia of chronic disease: Her hemoglobin is 10.8 today.  She will proceed with Retacrit.  Reviewed her CBC in detail today.   Bilateral shoulder pain: I offered to obtain x-rays for her today at [facility] long while she is here however she declined and will follow-up with Ortho about this.   Jocelyn Kaufman will return in 3 weeks for labs and an injection.  She tells me that she sees Dr. Pamelia Hoit prior to every third injection.  She knows to call for any questions or concerns that may arise between now and her next visit.

## 2023-04-02 NOTE — Progress Notes (Signed)
[facility] Cancer Follow up:    Jocelyn Brunette, MD [address] [place] [address]   DIAGNOSIS: Anemia of chronic kidney disease  SUMMARY OF HEMATOLOGIC HISTORY:  Patient has history of hypothyroidism as well as possible adrenal dysfunction issues for which she goes to [facility]   Previous blood work review: 1.  Erythropoietin: 24.2 2. SPEP: No M protein 3.  Haptoglobin: 146, LDH 281, absolute reticulocyte count 96.4, direct Coombs test negative 4.  B12 750 5.  Folic acid 23.5 6.  Ferritin 88, iron saturation 25 7. Hemoglobin 9.6 with MCV 89 8.  Aranesp 300 mcg started 05/06/2020.      CURRENT THERAPY: Retacrit  INTERVAL HISTORY: Jocelyn Kaufman 67 y.o. female returns for follow-up of her anemia of chronic disease currently on treatment with Retacrit that she receives every 3 weeks unless her hemoglobin is greater than 11.  She is tolerating this well.  She notes some intermittent right shoulder pain that has been ongoing since January 2024 that she plans on following up with her orthopedist about and also left shoulder/upper arm pain that began this past week unexpectedly that she plans on talking to her orthopedist about.  The pain she describes as being in her joints.     Patient Active Problem List   Diagnosis Date Noted   Physical deconditioning 09/30/2022   ABLA (acute blood loss anemia) 09/30/2022   Quadriceps muscle rupture, right, sequela 09/26/2022   Pulmonary embolus (HCC) 08/20/2022   Acquired trigger finger 05/26/2022   Abnormal reflex 02/20/2022   Anxiety 02/20/2022   Body mass index (BMI) 40.0-44.9, adult (HCC) 02/20/2022   Breast tenderness 02/20/2022   Carotid artery occlusion 02/20/2022   Cataract 02/20/2022   Chronic kidney disease, stage 2 (mild) 02/20/2022   Diabetic renal disease (HCC) 02/20/2022   Diabetic retinopathy associated with type 2 diabetes mellitus (HCC) 02/20/2022   Encounter for general adult medical examination  without abnormal findings 02/20/2022   Family history of malignant neoplasm of digestive organs 02/20/2022   Fibromyalgia 02/20/2022   Generalized anxiety disorder 02/20/2022   History of musculoskeletal disease 02/20/2022   Hypothyroid 02/20/2022   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) 02/20/2022   Morbid obesity (HCC) 02/20/2022   Mild intermittent asthma 02/20/2022   Myalgia 02/20/2022   Neuropathy of both feet 02/20/2022   Non-toxic nodular goiter 02/20/2022   Osteoarthritis of knee 02/20/2022   Obstructive sleep apnea syndrome 02/20/2022   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   Paroxysmal ventricular tachycardia (HCC) 02/20/2022   Postmenopausal state 02/20/2022   Personal history of transient ischemic attack (TIA), 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 deficits 02/20/2022   Recurrent major depression in full remission (HCC) 02/20/2022   Renal failure syndrome 02/20/2022   Sciatica 02/20/2022   Vitamin D deficiency 02/20/2022   Bilateral hearing loss 08/29/2020   Dysequilibrium 08/29/2020   Anemia in chronic kidney disease 06/17/2020   Anemia of chronic disease 04/25/2020   Impingement syndrome of right shoulder region 11/25/2018   Osteoarthritis of right shoulder 11/03/2018   Pain in joint of right shoulder 11/03/2018   Fall at home, initial encounter 07/06/2018   Displaced spiral fracture of shaft of humerus, right arm, initial encounter for closed fracture 07/06/2018   TIA (transient ischemic attack) 07/06/2018   Allergic rhinitis 07/04/2018   Normochromic normocytic anemia 01/20/2018   Impingement syndrome of left shoulder 12/17/2017   Rheumatoid arthritis (HCC) 04/30/2017   Lesion of left humerus 04/29/2017   Adrenal adenoma  03/19/2017   Neuropathy 03/19/2017   Spinal stenosis 03/19/2017   Pericardial effusion 03/14/2017   Diabetes mellitus (HCC) 03/14/2017   Essential hypertension 03/14/2017   Mixed hyperlipidemia 03/14/2017   Ulnar  neuropathy at wrist, 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   Meralgia paresthetica of right side 01/10/2017   History of Cushing disease 03/01/2016   Allergic rhinoconjunctivitis 07/09/2015   Asthma, well controlled 07/09/2015   Laryngopharyngeal reflux (LPR) 07/09/2015    is allergic to semaglutide(0.25 or 0.5mg -dos), shellfish-derived products, empagliflozin, rho (d) immune globulin, semaglutide, tramadol hcl, canagliflozin, latex, liraglutide, minoxidil, omeprazole, and percocet [oxycodone-acetaminophen].  MEDICAL HISTORY: 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   Urticaria     SURGICAL HISTORY: Past Surgical History:  Procedure Laterality Date   ADENOIDECTOMY     APPENDECTOMY     biopsy Left 12/2021   shin   biospy Right 04/2021   R 5th digit   biospy Left 04/2021   foot plantar surface   colonoscopy and endoscopy  2021   CYST REMOVAL LEG Right    Removed from right thigh joint   EYE SURGERY Bilateral 09/2021   cataract   foot surgery Left 09/2021   Excision lesion plantar left foot   LIGAMENT REPAIR Right 09/26/2022   Procedure: LIGAMENT REPAIR/knee;  Surgeon: Bjorn Pippin, MD;  Location: [facility] OR;  Service: Orthopedics;  Laterality: Right;   ORIF HUMERUS FRACTURE Right 07/09/2018   Procedure: OPEN REDUCTION INTERNAL FIXATION (ORIF) HUMERAL SHAFT FRACTURE;  Surgeon: Bjorn Pippin, MD;  Location: [facility] OR;  Service: Orthopedics;  Laterality: Right;   QUADRICEPS TENDON REPAIR Right 09/26/2022   Procedure:  REVISION REPAIR QUADRICEP TENDON;  Surgeon: Bjorn Pippin, MD;  Location: [facility] OR;  Service: Orthopedics;  Laterality: Right;   TONSILLECTOMY     torn right knee tendon repair Right    2023   TOTAL VAGINAL HYSTERECTOMY     UMBILICAL HERNIA REPAIR      SOCIAL HISTORY: Social History   Socioeconomic History   Marital status: Single    Spouse name: Not on file   Number of children: 0   Years of education: Masters   Highest education level: Not on file  Occupational History   Occupation: [employer]  Tobacco Use   Smoking status: Never   Smokeless tobacco: Never  Building services engineer Use: Never used  Substance and Sexual Activity   Alcohol use: Not Currently    Comment: occ   Drug use: No   Sexual activity: Not Currently  Other Topics Concern   Not on file  Social History Narrative   Lives alone   Caffeine use: 2 cups coffee per day   Tea sometimes   Soda- 3 x/week   Right-handed   Retired.    Masters Degree   No Children   Single.   Social Determinants of Health   Financial  Resource Strain: Not on file  Food Insecurity: No Food Insecurity (09/27/2022)   Hunger Vital Sign    Worried About Running Out of Food in the Last Year: Never true    Ran Out of Food in the Last Year: Never true  Transportation Needs: No Transportation Needs (09/27/2022)   PRAPARE - Administrator, Civil Service (Medical): No    Lack of Transportation (Non-Medical): No  Physical Activity: Not on file  Stress: Not on file  Social Connections: Not on file  Intimate Partner Violence: Not At Risk (09/27/2022)   Humiliation, Afraid, Rape, and Kick questionnaire    Fear of Current or Ex-Partner: No    Emotionally Abused: No    Physically Abused: No    Sexually Abused: No    FAMILY HISTORY: Family History  Problem Relation Age of Onset   Hypertension Mother    Stroke Mother    Kidney failure Mother    Heart disease Mother        heart murmur   Glaucoma Mother     Stroke Father    COPD Father    Cancer - Prostate Father    Colon cancer Father    Diabetes Sister    Thyroid cancer Sister    Epilepsy Sister    Ovarian cancer Paternal Aunt     Review of Systems  Constitutional:  Negative for appetite change, chills, fatigue, fever and unexpected weight change.  HENT:   Negative for hearing loss, lump/mass and trouble swallowing.   Eyes:  Negative for eye problems and icterus.  Respiratory:  Negative for chest tightness, cough and shortness of breath.   Cardiovascular:  Negative for chest pain, leg swelling and palpitations.  Gastrointestinal:  Negative for abdominal distention, abdominal pain, constipation, diarrhea, nausea and vomiting.  Endocrine: Negative for hot flashes.  Genitourinary:  Negative for difficulty urinating.   Musculoskeletal:  Positive for arthralgias.  Skin:  Negative for itching and rash.  Neurological:  Negative for dizziness, extremity weakness, headaches and numbness.  Hematological:  Negative for adenopathy. Does not bruise/bleed easily.  Psychiatric/Behavioral:  Negative for depression. The patient is not nervous/anxious.       PHYSICAL EXAMINATION   Onc Performance Status - 04/02/23 1137       ECOG Perf Status   ECOG Perf Status Restricted in physically strenuous activity but ambulatory and able to carry out work of a light or sedentary nature, e.g., light house work, office work      KPS SCALE   KPS % SCORE Able to carry on normal activity, minor s/s of disease             Vitals:   04/02/23 1127  BP: (!) 124/40  Pulse: 71  Resp: 16  Temp: (!) 97.5 F (36.4 C)  SpO2: 97%  Deferred in lieu of counseling    LABORATORY DATA:  CBC    Component Value Date/Time   WBC 6.1 04/02/2023 1055   WBC 15.4 (H) 10/03/2022 0842   RBC 3.88 04/02/2023 1055   HGB 10.8 (L) 04/02/2023 1055   HCT 35.1 (L) 04/02/2023 1055   PLT 145 (L) 04/02/2023 1055   MCV 90.5 04/02/2023 1055   MCH 27.8 04/02/2023 1055    MCHC 30.8 04/02/2023 1055   RDW 15.5 04/02/2023 1055   LYMPHSABS 1.7 04/02/2023 1055   MONOABS 0.4 04/02/2023 1055   EOSABS 0.1 04/02/2023 1055   BASOSABS 0.0 04/02/2023 1055    CMP     Component Value  Date/Time   NA 135 10/04/2022 0715   NA 140 10/07/2018 0908   K 4.8 10/04/2022 0715   CL 107 10/04/2022 0715   CO2 24 10/04/2022 0715   GLUCOSE 143 (H) 10/04/2022 0715   BUN 35 (H) 10/04/2022 0715   BUN 26 10/07/2018 0908   CREATININE 1.39 (H) 10/04/2022 0715   CREATININE 1.15 (H) 08/31/2022 1317   CALCIUM 8.4 (L) 10/04/2022 0715   PROT 6.0 (L) 08/31/2022 1317   PROT 6.3 04/29/2017 1325   ALBUMIN 3.9 08/31/2022 1317   AST 28 08/31/2022 1317   ALT 37 08/31/2022 1317   ALKPHOS 74 08/31/2022 1317   BILITOT 0.4 08/31/2022 1317   GFRNONAA 42 (L) 10/04/2022 0715   GFRNONAA 53 (L) 08/31/2022 1317   GFRAA 60 10/07/2018 0908       ASSESSMENT and THERAPY PLAN:   Anemia in chronic kidney disease Patient has history of hypothyroidism as well as possible adrenal dysfunction issues for which she goes to [facility]   Previous blood work review: 1.  Erythropoietin: 24.2 2. SPEP: No M protein 3.  Haptoglobin: 146, LDH 281, absolute reticulocyte count 96.4, direct Coombs test negative 4.  B12 750 5.  Folic acid 23.5 6.  Ferritin 88, iron saturation 25 7. Hemoglobin 9.6 with MCV 89  ----------------------------------------------------------------------------------------------------------------------------------------------- Etiology: Anemia of chronic disease secondary to chronic kidney disease, hypothyroidism and adrenal issues   Current treatment: Retacrit every 3 weeks for hemoglobin <11  Anemia of chronic disease: Her hemoglobin is 10.8 today.  She will proceed with Retacrit.  Reviewed her CBC in detail today.   Bilateral shoulder pain: I offered to obtain x-rays for her today at [facility] long while she is here however she declined and will follow-up with Ortho about this.   Demyia  will return in 3 weeks for labs and an injection.  She tells me that she sees Dr. Pamelia Hoit prior to every third injection.  She knows to call for any questions or concerns that may arise between now and her next visit.   All questions were answered. The patient knows to call the clinic with any problems, questions or concerns. We can certainly see the patient much sooner if necessary.  Total encounter time:20 minutes*in face-to-face visit time, chart review, lab review, care coordination, order entry, and documentation of the encounter time.    Lillard Anes, NP 04/02/23 1:34 PM Medical Oncology and Hematology [facility] [address] [place], [place] [address] Tel. [phone]    Fax. [phone]  *Total Encounter Time as defined by the Centers for Medicare and Medicaid Services includes, in addition to the face-to-face time of a patient visit (documented in the note above) non-face-to-face time: obtaining and reviewing outside history, ordering and reviewing medications, tests or procedures, care coordination (communications with other health care professionals or caregivers) and documentation in the medical record.

## 2023-04-04 DIAGNOSIS — M25661 Stiffness of right knee, not elsewhere classified: Secondary | ICD-10-CM | POA: Diagnosis not present

## 2023-04-04 DIAGNOSIS — S76111D Strain of right quadriceps muscle, fascia and tendon, subsequent encounter: Secondary | ICD-10-CM | POA: Diagnosis not present

## 2023-04-04 DIAGNOSIS — M6281 Muscle weakness (generalized): Secondary | ICD-10-CM | POA: Diagnosis not present

## 2023-04-04 DIAGNOSIS — R262 Difficulty in walking, not elsewhere classified: Secondary | ICD-10-CM | POA: Diagnosis not present

## 2023-04-08 DIAGNOSIS — M6281 Muscle weakness (generalized): Secondary | ICD-10-CM | POA: Diagnosis not present

## 2023-04-08 DIAGNOSIS — R262 Difficulty in walking, not elsewhere classified: Secondary | ICD-10-CM | POA: Diagnosis not present

## 2023-04-08 DIAGNOSIS — S76111D Strain of right quadriceps muscle, fascia and tendon, subsequent encounter: Secondary | ICD-10-CM | POA: Diagnosis not present

## 2023-04-08 DIAGNOSIS — M25661 Stiffness of right knee, not elsewhere classified: Secondary | ICD-10-CM | POA: Diagnosis not present

## 2023-04-08 NOTE — Progress Notes (Signed)
[facility] PRE[facility] Weekly Session  Patient Details  Name: Jocelyn Kaufman MRN: [id number] Date of Birth: [birth year] Age: 67 y.o. PCP: Merri Brunette, MD  Vitals:   04/08/23 1030  Weight: 228 lb (103.4 kg)     [facility] Weekly seesion - 04/08/23 1200       [facility] "PREP" Location   [facility]      Weekly Session   Topic Discussed --   portions   Minutes exercised this week --   3 sessions of cardio, 3 sessions of strength training, 3 sessions of stretching   Classes attended to date 7             [name] 04/08/2023, 12:43 PM

## 2023-04-09 DIAGNOSIS — R1011 Right upper quadrant pain: Secondary | ICD-10-CM | POA: Diagnosis not present

## 2023-04-09 DIAGNOSIS — K319 Disease of stomach and duodenum, unspecified: Secondary | ICD-10-CM | POA: Diagnosis not present

## 2023-04-09 DIAGNOSIS — R1013 Epigastric pain: Secondary | ICD-10-CM | POA: Diagnosis not present

## 2023-04-11 DIAGNOSIS — R262 Difficulty in walking, not elsewhere classified: Secondary | ICD-10-CM | POA: Diagnosis not present

## 2023-04-11 DIAGNOSIS — M6281 Muscle weakness (generalized): Secondary | ICD-10-CM | POA: Diagnosis not present

## 2023-04-11 DIAGNOSIS — M25661 Stiffness of right knee, not elsewhere classified: Secondary | ICD-10-CM | POA: Diagnosis not present

## 2023-04-11 DIAGNOSIS — S76111D Strain of right quadriceps muscle, fascia and tendon, subsequent encounter: Secondary | ICD-10-CM | POA: Diagnosis not present

## 2023-04-15 DIAGNOSIS — S76111D Strain of right quadriceps muscle, fascia and tendon, subsequent encounter: Secondary | ICD-10-CM | POA: Diagnosis not present

## 2023-04-15 DIAGNOSIS — M25661 Stiffness of right knee, not elsewhere classified: Secondary | ICD-10-CM | POA: Diagnosis not present

## 2023-04-15 DIAGNOSIS — R262 Difficulty in walking, not elsewhere classified: Secondary | ICD-10-CM | POA: Diagnosis not present

## 2023-04-15 DIAGNOSIS — M6281 Muscle weakness (generalized): Secondary | ICD-10-CM | POA: Diagnosis not present

## 2023-04-15 NOTE — Progress Notes (Signed)
[facility] PREP Weekly Session  Patient Details  Name: Jocelyn Kaufman MRN: [id number] Date of Birth: 23-Jun-1956 Age: 67 y.o. PCP: Merri Brunette, MD  Vitals:   04/15/23 1030  Weight: 227 lb (103 kg)     [facility] Weekly seesion - 04/15/23 1500       [facility] "PREP" Location   [facility]PREP[facility] Location [facility]      Weekly Session   Topic Discussed Finding support    Minutes exercised this week 90 minutes   2 sessions of strength training and 2 sessions of flexibility   Classes attended to date 13             [name] 04/15/2023, 3:38 PM

## 2023-04-16 DIAGNOSIS — M25562 Pain in left knee: Secondary | ICD-10-CM | POA: Diagnosis not present

## 2023-04-16 DIAGNOSIS — M25511 Pain in right shoulder: Secondary | ICD-10-CM | POA: Diagnosis not present

## 2023-04-16 DIAGNOSIS — M25512 Pain in left shoulder: Secondary | ICD-10-CM | POA: Diagnosis not present

## 2023-04-18 ENCOUNTER — Encounter: Payer: Self-pay | Admitting: Hematology and Oncology

## 2023-04-18 DIAGNOSIS — S76111D Strain of right quadriceps muscle, fascia and tendon, subsequent encounter: Secondary | ICD-10-CM | POA: Diagnosis not present

## 2023-04-18 DIAGNOSIS — R262 Difficulty in walking, not elsewhere classified: Secondary | ICD-10-CM | POA: Diagnosis not present

## 2023-04-18 DIAGNOSIS — M6281 Muscle weakness (generalized): Secondary | ICD-10-CM | POA: Diagnosis not present

## 2023-04-18 DIAGNOSIS — M25661 Stiffness of right knee, not elsewhere classified: Secondary | ICD-10-CM | POA: Diagnosis not present

## 2023-04-20 NOTE — Progress Notes (Unsigned)
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 She is here today for follow-up regarding her PVCs and hypertension.   Past Medical History    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   Urticaria    Past Surgical History:  Procedure Laterality Date   ADENOIDECTOMY     APPENDECTOMY     biopsy Left 12/2021   shin   biospy Right 04/2021   R 5th digit   biospy Left 04/2021   foot plantar surface   colonoscopy and endoscopy  2021   CYST REMOVAL LEG Right    Removed from right thigh joint   EYE SURGERY Bilateral 09/2021   cataract   foot surgery Left 09/2021   Excision lesion plantar left foot   LIGAMENT REPAIR Right 09/26/2022   Procedure: LIGAMENT REPAIR/knee;  Surgeon: Bjorn Pippin, MD;  Location: [facility] OR;  Service: Orthopedics;  Laterality: Right;   ORIF HUMERUS FRACTURE Right 07/09/2018   Procedure: OPEN REDUCTION INTERNAL FIXATION (ORIF) HUMERAL SHAFT FRACTURE;  Surgeon: Bjorn Pippin, MD;  Location: [facility] OR;  Service: Orthopedics;   Laterality: Right;   QUADRICEPS TENDON REPAIR Right 09/26/2022   Procedure: REVISION REPAIR QUADRICEP TENDON;  Surgeon: Bjorn Pippin, MD;  Location: [facility] OR;  Service: Orthopedics;  Laterality: Right;   TONSILLECTOMY     torn right knee tendon repair Right    2023   TOTAL VAGINAL HYSTERECTOMY     UMBILICAL HERNIA REPAIR     Allergies Allergies  Allergen Reactions   Semaglutide(0.25 Or 0.5mg -Dos) Other (See Comments)    Causes cataracts requiring surgery and impairing vision    Shellfish-Derived Products Anaphylaxis, Swelling and Other (See Comments)    Patient avoids   Empagliflozin Itching, Other (See Comments) and Swelling    UTI sx's and yeast infections   Semaglutide Other (See Comments)    Other reaction(s): worsening DM RETINOPATHY   Tramadol Hcl Other (See Comments)    hallucinations   Canagliflozin Other (See Comments)    Urinary incontinence    Latex Rash and Other (See Comments)   Liraglutide Itching    Other reaction(s): itching all over   Minoxidil Rash    skin irritation   Omeprazole Rash and Other (See Comments)   Percocet [Oxycodone-Acetaminophen] Nausea And Vomiting   Labs/Other Studies Reviewed    The following studies were reviewed today: Cardiac Studies & Procedures     STRESS TESTS  MYOCARDIAL PERFUSION IMAGING 07/31/2022  Narrative  The study is normal. The study is low risk.   No ST deviation was noted.   LV perfusion is normal. There is no evidence of ischemia. There is no evidence of infarction.   Left ventricular function is normal. Nuclear stress EF: 55 %. The left ventricular ejection fraction is normal (55-65%). End diastolic cavity size is normal. End systolic cavity size is normal.   Prior study available for comparison from 10/16/2018.  Breast attenuation no ischemia normal EF 55%   ECHOCARDIOGRAM  ECHOCARDIOGRAM COMPLETE 03/25/2023  Narrative ECHOCARDIOGRAM REPORT    Patient Name:   Jocelyn Kaufman Date of Exam:  03/21/2023 Medical Rec #:  [id number]    Height:       64.5 in Accession #:    [id number]   Weight:       230.0 lb Date of Birth:  Dec 06, 1955    BSA:          2.087 m Patient Age:    88 years     BP:           125/60 mmHg Patient Gender: F            HR:           66 bpm. Exam Location:  Outpatient  Procedure: 2D Echo, 3D Echo, Cardiac Doppler, Color Doppler and Strain Analysis  Indications:    Dyspnea  History:        Patient has prior history of Echocardiogram examinations, most recent 05/15/2022. TIA; Risk Factors:Hypertension and Non-Smoker. Pericardial Effusion, Paroxysmal Ventricular Tachycardia, Pulmonary Embolism.  Sonographer:    Jeryl Columbia RDCS Referring Phys: [id number] CAITLIN S WALKER  IMPRESSIONS   1. Left ventricular ejection fraction, by estimation, is 50 to 55%. The left ventricle has low normal function. The left ventricle demonstrates global hypokinesis. The left ventricular internal cavity size was mildly dilated. Left ventricular diastolic parameters are consistent with Grade III diastolic dysfunction (restrictive). 2. Right ventricular systolic function is normal. The right ventricular size is normal. There is normal pulmonary artery systolic pressure. 3. Left atrial size was mild to moderately dilated. 4. Right atrial size was mildly dilated. 5. The mitral valve is grossly normal. Trivial mitral valve regurgitation. No evidence of mitral stenosis. Moderate to severe mitral annular calcification. 6. The aortic valve was not well visualized. There is mild calcification of the aortic valve. There is mild thickening of the aortic valve. Aortic valve regurgitation is not visualized. Aortic valve sclerosis is present, with no evidence of aortic valve stenosis. 7. The inferior vena cava is normal in size with greater than 50% respiratory variability, suggesting right atrial pressure of 3 mmHg.  Comparison(s): Prior images reviewed side by side. Changes from prior study  are noted. EF slightly improved compared to prior study.  FINDINGS Left Ventricle: Left ventricular ejection fraction, by estimation, is 50 to 55%. The left ventricle has low normal function. The left ventricle demonstrates global hypokinesis. The left ventricular internal cavity size was mildly dilated. There is no left ventricular hypertrophy. Left ventricular diastolic parameters are consistent with Grade III diastolic dysfunction (restrictive).  Right Ventricle: The right ventricular size is normal. Right vetricular wall thickness was not well visualized. Right ventricular systolic function is normal. There is normal pulmonary artery systolic pressure. The tricuspid regurgitant velocity is 2.13 m/s, and with an assumed right atrial pressure of 3 mmHg, the estimated right ventricular systolic pressure is 21.1 mmHg.  Left Atrium: Left atrial size was mild to moderately dilated.  Right Atrium: Right  atrial size was mildly dilated.  Pericardium: There is no evidence of pericardial effusion.  Mitral Valve: The mitral valve is grossly normal. Moderate to severe mitral annular calcification. Trivial mitral valve regurgitation. No evidence of mitral valve stenosis. MV peak gradient, 8.0 mmHg. The mean mitral valve gradient is 2.0 mmHg.  Tricuspid Valve: The tricuspid valve is grossly normal. Tricuspid valve regurgitation is trivial. No evidence of tricuspid stenosis.  Aortic Valve: The aortic valve was not well visualized. There is mild calcification of the aortic valve. There is mild thickening of the aortic valve. Aortic valve regurgitation is not visualized. Aortic valve sclerosis is present, with no evidence of aortic valve stenosis.  Pulmonic Valve: The pulmonic valve was not well visualized. Pulmonic valve regurgitation is trivial.  Aorta: The aortic root, ascending aorta, aortic arch and descending aorta are all structurally normal, with no evidence of dilitation or obstruction.  Venous:  The inferior vena cava is normal in size with greater than 50% respiratory variability, suggesting right atrial pressure of 3 mmHg.  IAS/Shunts: The interatrial septum was not well visualized.   LEFT VENTRICLE PLAX 2D LVIDd:         5.09 cm   Diastology LVIDs:         3.76 cm   LV e' medial:    3.71 cm/s LV PW:         1.24 cm   LV E/e' medial:  38.0 LV IVS:        0.77 cm   LV e' lateral:   3.30 cm/s LVOT diam:     2.10 cm   LV E/e' lateral: 42.7 LV SV:         66 LV SV Index:   32 LVOT Area:     3.46 cm  3D Volume EF: 3D EF:        55 % LV EDV:       125 ml LV ESV:       57 ml LV SV:        69 ml  RIGHT VENTRICLE RV Basal diam:  4.38 cm RV Mid diam:    3.21 cm RV S prime:     8.86 cm/s TAPSE (M-mode): 2.7 cm  LEFT ATRIUM             Index        RIGHT ATRIUM           Index LA diam:        4.70 cm 2.25 cm/m   RA Area:     19.10 cm LA Vol (A2C):   75.2 ml 36.03 ml/m  RA Volume:   54.80 ml  26.26 ml/m LA Vol (A4C):   65.8 ml 31.53 ml/m LA Biplane Vol: 71.7 ml 34.35 ml/m AORTIC VALVE LVOT Vmax:   90.60 cm/s LVOT Vmean:  59.400 cm/s LVOT VTI:    0.191 m  AORTA Ao Root diam: 2.90 cm Ao Asc diam:  3.50 cm  MITRAL VALVE                TRICUSPID VALVE MV Area (PHT): 4.60 cm     TR Peak grad:   18.1 mmHg MV Area VTI:   1.86 cm     TR Vmax:        213.00 cm/s MV Peak grad:  8.0 mmHg MV Mean grad:  2.0 mmHg     SHUNTS MV Vmax:       1.41 m/s     Systemic VTI:  0.19 m MV Vmean:      51.7 cm/s    Systemic Diam: 2.10 cm MV Decel Time: 165 msec MR Peak grad: 43.8 mmHg MR Vmax:      331.00 cm/s MV E velocity: 141.00 cm/s MV A velocity: 42.00 cm/s MV E/A ratio:  3.36  Jodelle Red MD Electronically signed by Jodelle Red MD Signature Date/Time: 03/25/2023/10:13:56 PM    Final    MONITORS  CARDIAC EVENT MONITOR 09/09/2018          Recent Labs: 08/20/2022: B Natriuretic Peptide 186.3 08/21/2022: Magnesium 1.9 08/31/2022: ALT  37 10/04/2022: BUN 35; Creatinine, Ser 1.39; Potassium 4.8; Sodium 135 04/02/2023: Hemoglobin 10.8; Platelet Count 145   History of Present Illness    67 year old female with past medical history of 2 diabetes melitis, depression, hyperlipidemia, hypertension, left ulnar neuropathy, obstructive sleep apnea, bilateral adrenal hyperplasia without Cushing's disease, normal coronary arteries by heart catheterization in 2012, anemia, PVC, and nonsustained VT.   She was seen in 04/2022 noting exertional dyspnea with subsequent echocardiogram that indicated normal LVEF of 55 to 60%, moderate to severe mitral calcification with mild MR.  Her most recent ischemic workup was a Myoview on 07/31/2022 that was a low risk study with no ischemia.  On 08/20/2022 she was admitted with a PE following a quadriceps tendon repair 2 weeks prior.  She was treated with [facility] for 3 months.  On admission she underwent an echo that indicated an LVEF of 45 to 50%, LV global hypokinesis, mild MR, aortic sclerosis without stenosis.  Last seen in office on 02/15/2023.  She reported a low energy level that she attributed to anemia, was still noting exertional dyspnea which she feels is related to her rheumatoid arthritis.  She had been started on HCTZ daily by Dr. Malen Gauze of nephrology but it was causing urinary frequency and was started on chlorthalidone 12.5mg  daily for hypertension and lower extremity edema. She had a repeat echo on 03/21/23 that indicated ejection fraction of 50 to 55%, LV with normal low normal function and global hypokinesis, grade III diastolic dysfunction, trivial mitral valve regurgitation with moderate to severe mitral annular calcification. Aortic valve sclerosis was present with no evidence of stenosis.   Today she reports she has not been taking her chlorthalidone daily, will take when she notices her feet and toes swelling. Notes increased urine output with taking. Reports high stress recently, with the one year  anniversary of her mothers passing and with her brother recently passing. She has had a few episodes of shortness of breath in the last two weeks, unable to determine if this is related to exertion. Also notes right sided chest pain that is atypical in nature, occurs randomly, not associated with exertion, self resolves in a few minutes. Home blood pressures have been averaging 130's/60's. She denies pnd, orthopnea, dizziness, syncope, edema, weight gain, or early satiety. All other systems reviewed and are otherwise negative except as noted above.    Home Medications    Current Outpatient Medications  Medication Sig Dispense Refill   ACCU-CHEK GUIDE test strip      Accu-Chek Softclix Lancets lancets use to check blood sugar     Aflibercept (EYLEA) 2 MG/0.05ML SOLN 2 mg by Intravitreal route every 3 (three) months.     albuterol (VENTOLIN HFA) 108 (90 Base) MCG/ACT inhaler Inhale two puffs every four to six hours as needed for cough or wheeze. (Patient taking differently: Inhale 1-2 puffs into the lungs every 6 (six) hours  as needed for wheezing.) 18 g 2   amLODipine (NORVASC) 10 MG tablet Take 1 tablet (10 mg total) by mouth daily.     aspirin EC 81 MG tablet Take 81 mg by mouth daily. Swallow whole.     atorvastatin (LIPITOR) 40 MG tablet Take 1 tablet (40 mg total) by mouth daily at 6 PM. (Patient taking differently: Take 40 mg by mouth daily.) 30 tablet 0   B-D ULTRAFINE III SHORT PEN 31G X 8 MM MISC SMARTSIG:1 Each SUB-Q Daily     CHELATED IRON PO Take 18 mg by mouth daily.     chlorthalidone (HYGROTON) 25 MG tablet Take 0.5 tablets (12.5 mg total) by mouth daily. 15 tablet 2   Cholecalciferol (VITAMIN D3) 125 MCG (5000 UT) CAPS Take 5,000 Units by mouth daily.     Dulaglutide (TRULICITY) 1.5 MG/0.5ML SOPN Inject 1.5 mg into the skin once a week. 2 mL 5   EPINEPHrine (EPIPEN 2-PAK) 0.3 mg/0.3 mL IJ SOAJ injection Use as directed for life-threatening allergic reaction. 2 each 1   epoetin  alfa-epbx (RETACRIT) 2000 UNIT/ML injection See admin instructions.     escitalopram (LEXAPRO) 20 MG tablet Take 20 mg by mouth daily.     ezetimibe (ZETIA) 10 MG tablet Take 1 tablet (10 mg total) by mouth daily.     fexofenadine (ALLEGRA ALLERGY) 180 MG tablet Take 1 tablet (180 mg total) by mouth daily.     folic acid (FOLVITE) 1 MG tablet Take 2 mg by mouth daily.     hydrALAZINE (APRESOLINE) 50 MG tablet Take 50 mg by mouth 3 (three) times daily.     hydrocortisone cream 1 % Apply 1 Application topically 2 (two) times daily as needed for itching.     inFLIXimab (REMICADE IV) Inject into the vein.     insulin aspart (NOVOLOG) 100 UNIT/ML FlexPen Inject into the skin See admin instructions. Sliding Scale     insulin glargine, 2 Unit Dial, (TOUJEO MAX SOLOSTAR) 300 UNIT/ML Solostar Pen Inject 42 Units into the skin at bedtime.     irbesartan (AVAPRO) 300 MG tablet Take 1 tablet (300 mg total) by mouth daily.     Lancets Misc. (ACCU-CHEK SOFTCLIX LANCET DEV) KIT See admin instructions.     latanoprost (XALATAN) 0.005 % ophthalmic solution Place 1 drop into both eyes at bedtime.     lidocaine (LMX) 4 % cream Apply 1 Application topically as needed (pain).     Methotrexate Sodium (METHOTREXATE, PF,) 50 MG/2ML injection Inject 20 mg into the muscle every Wednesday. 0.8 ml     metoprolol succinate (TOPROL-XL) 25 MG 24 hr tablet Take 1 tablet (25 mg total) by mouth daily. May take additional half tablet as needed once daily for palpitations. (Patient taking differently: Take 12.5-25 mg by mouth See admin instructions. Take 25 mg by mouth once a day and an additional 12.5 mg (half tablet) as needed once daily for palpitations) 135 tablet 3   Multiple Vitamins-Minerals (CENTRUM SILVER PO) Take 1 tablet by mouth daily with breakfast.     sulfaSALAzine (AZULFIDINE) 500 MG tablet Take 3 tablets (1,500 mg total) by mouth 2 (two) times daily.     SYNTHROID 125 MCG tablet Take 125 mcg by mouth every Monday,  Tuesday, Wednesday, Thursday, and Friday.     triamcinolone cream (KENALOG) 0.1 % Apply 1 Application topically 2 (two) times daily as needed (for itching- affected areas).     vitamin B-12 (CYANOCOBALAMIN) 1000 MCG tablet Take 1,000 mcg  by mouth daily.     No current facility-administered medications for this visit.     Review of Systems  She denies pnd, orthopnea, dizziness, syncope, edema, weight gain, or early satiety. All other systems reviewed and are otherwise negative except as noted above.      Physical Exam    VS:  BP 134/64   Pulse 65   Ht 5' 4.5" (1.638 m)   Wt 227 lb (103 kg)   BMI 38.36 kg/m  , BMI Body mass index is 38.36 kg/m.     GEN: Well nourished, well developed, in no acute distress. HEENT: normal. Neck: Supple, no JVD, carotid bruits, or masses. Cardiac: RRR, no murmurs, rubs, or gallops. No clubbing, cyanosis, edema.  Radials/DP/PT 2+ and equal bilaterally.  Respiratory:  Respirations regular and unlabored, clear to auscultation bilaterally. GI: Soft, nontender, nondistended, BS + x 4. MS: no deformity or atrophy. Skin: warm and dry, no rash. Neuro:  Strength and sensation are intact. Psych: Normal affect.  Accessory Clinical Findings    No EKG ordered today.    Lab Results  Component Value Date   WBC 6.1 04/02/2023   HGB 10.8 (L) 04/02/2023   HCT 35.1 (L) 04/02/2023   MCV 90.5 04/02/2023   PLT 145 (L) 04/02/2023   Lab Results  Component Value Date   CREATININE 1.39 (H) 10/04/2022   BUN 35 (H) 10/04/2022   NA 135 10/04/2022   K 4.8 10/04/2022   CL 107 10/04/2022   CO2 24 10/04/2022   Lab Results  Component Value Date   ALT 37 08/31/2022   AST 28 08/31/2022   ALKPHOS 74 08/31/2022   BILITOT 0.4 08/31/2022   Lab Results  Component Value Date   CHOL 221 (H) 07/07/2018   HDL 42 07/07/2018   LDLCALC 126 (H) 07/07/2018   TRIG 266 (H) 07/07/2018   CHOLHDL 5.3 07/07/2018    Lab Results  Component Value Date   HGBA1C 5.6 08/21/2022    Assessment & Plan   HFmrEF: Echo in 07/2022 indicated LVEF of 45-50% in setting of PE. Repeat echo on 03/21/23 indicated improvement of EF to 50 to 55%, LV noted to have normal low function and global hypokinesis, grade III diastolic dysfunction. Notes intermittent shortness of breath the last two weeks following the passing of her brother, notes significant stressors recently. Takes chlorthalidone about three times a week when she feels her feet are swollen with good effect. Educated to contact office for weight gain of 2lbs overnight or 5lbs in a week. If continue to have volume control difficulties can consider addition of SGLT2 inhibitor. Continue amlodipine, chlorthalidone, atorvastatin, ezetimibe, hydralazine, irbesartan and metoprolol.   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 Latest Myoview from 07/2022 indicated low risk and no ischemia. With chest pain that is atypical she will monitor. Continue aspirin, amlodipine, chlorthalidone, atorvastatin, ezetimibe, hydralazine, irbesartan and metoprolol.   NSVT/PVC: Notes rare palpitations. Continue Toprol 25mg  daily, may take an additional half tablet as needed for breakthrough palpitations.   DM2- Continue to follow with PCP.   HTN: Blood pressure today 134/64, reports at home will run 130's/60's. She did not take her chlorthalidone today. Takes about three times a week if she feels her feet are swollen. Continue amlodipine, chlorthalidone, hydralazine, irbesartan and metoprolol.   OSA- CPAP compliance encouraged.   Anemia of chronic disease- Continue to follow with hematology.  Obesity- Working on improving heart healthy diet and increasing exercise. Participating in PREP exercise program.   Depression: Notes history of depression related to family members passing and feeling overwhelmed with her medical needs.  Encouraged to call medicare to determine which counselors are available to her. Social work to provide resources for grief counseling.   Disposition: Follow up in 2-3 months.     Rip Harbour, NP 04/22/2023, 4:45 PM

## 2023-04-22 ENCOUNTER — Ambulatory Visit (HOSPITAL_BASED_OUTPATIENT_CLINIC_OR_DEPARTMENT_OTHER): Payer: Medicare PPO | Admitting: Cardiology

## 2023-04-22 ENCOUNTER — Encounter (HOSPITAL_BASED_OUTPATIENT_CLINIC_OR_DEPARTMENT_OTHER): Payer: Self-pay | Admitting: Cardiology

## 2023-04-22 VITALS — BP 134/64 | HR 65 | Ht 64.5 in | Wt 227.0 lb

## 2023-04-22 DIAGNOSIS — D638 Anemia in other chronic diseases classified elsewhere: Secondary | ICD-10-CM | POA: Diagnosis not present

## 2023-04-22 DIAGNOSIS — G4733 Obstructive sleep apnea (adult) (pediatric): Secondary | ICD-10-CM | POA: Diagnosis not present

## 2023-04-22 DIAGNOSIS — R262 Difficulty in walking, not elsewhere classified: Secondary | ICD-10-CM | POA: Diagnosis not present

## 2023-04-22 DIAGNOSIS — I4729 Other ventricular tachycardia: Secondary | ICD-10-CM | POA: Diagnosis not present

## 2023-04-22 DIAGNOSIS — Z6841 Body Mass Index (BMI) 40.0 and over, adult: Secondary | ICD-10-CM

## 2023-04-22 DIAGNOSIS — M25661 Stiffness of right knee, not elsewhere classified: Secondary | ICD-10-CM | POA: Diagnosis not present

## 2023-04-22 DIAGNOSIS — I5022 Chronic systolic (congestive) heart failure: Secondary | ICD-10-CM

## 2023-04-22 DIAGNOSIS — I10 Essential (primary) hypertension: Secondary | ICD-10-CM | POA: Diagnosis not present

## 2023-04-22 DIAGNOSIS — I2510 Atherosclerotic heart disease of native coronary artery without angina pectoris: Secondary | ICD-10-CM

## 2023-04-22 DIAGNOSIS — S76111D Strain of right quadriceps muscle, fascia and tendon, subsequent encounter: Secondary | ICD-10-CM | POA: Diagnosis not present

## 2023-04-22 DIAGNOSIS — M6281 Muscle weakness (generalized): Secondary | ICD-10-CM | POA: Diagnosis not present

## 2023-04-22 NOTE — Patient Instructions (Addendum)
Medication Instructions:  Continue your current medications.   *If you need a refill on your cardiac medications before your next appointment, please call your pharmacy*  Follow-Up: At [facility], you and your health needs are our priority.  As part of our continuing mission to provide you with exceptional heart care, we have created designated Provider Care Teams.  These Care Teams include your primary Cardiologist (physician) and Advanced Practice Providers (APPs -  Physician Assistants and Nurse Practitioners) who all work together to provide you with the care you need, when you need it.  We recommend signing up for the patient portal called "MyChart".  Sign up information is provided on this After Visit Summary.  MyChart is used to connect with patients for Virtual Visits (Telemedicine).  Patients are able to view lab/test results, encounter notes, upcoming appointments, etc.  Non-urgent messages can be sent to your provider as well.   To learn more about what you can do with MyChart, go to [email or web address].    Your next appointment:   2-3 month(s)  Provider:   Chilton Si, MD or Gillian Shields, NP    Other Instructions  Recommend calling the phone number on the back of your insurance card to ask for a list of in-network counseling resources.    Our social work team is also sending you some resources for grief support via mail.  Typical heart related chest pain happens when you are up doing something and is left sided. Your current symptoms are atypical which is reassuring. Your aspirin, Rosuvastatin, metoprolol, and zetia help to prevent any plaque build up in your heart arteries from progressing.

## 2023-04-23 ENCOUNTER — Inpatient Hospital Stay: Payer: Medicare PPO

## 2023-04-23 ENCOUNTER — Telehealth: Payer: Self-pay | Admitting: Licensed Clinical Social Worker

## 2023-04-23 VITALS — BP 139/63 | HR 66 | Temp 98.5°F | Resp 18

## 2023-04-23 DIAGNOSIS — N183 Chronic kidney disease, stage 3 unspecified: Secondary | ICD-10-CM

## 2023-04-23 DIAGNOSIS — N182 Chronic kidney disease, stage 2 (mild): Secondary | ICD-10-CM | POA: Diagnosis not present

## 2023-04-23 DIAGNOSIS — D631 Anemia in chronic kidney disease: Secondary | ICD-10-CM | POA: Diagnosis not present

## 2023-04-23 DIAGNOSIS — D649 Anemia, unspecified: Secondary | ICD-10-CM

## 2023-04-23 DIAGNOSIS — D638 Anemia in other chronic diseases classified elsewhere: Secondary | ICD-10-CM

## 2023-04-23 DIAGNOSIS — E1122 Type 2 diabetes mellitus with diabetic chronic kidney disease: Secondary | ICD-10-CM | POA: Diagnosis not present

## 2023-04-23 LAB — CMP (CANCER CENTER ONLY)
ALT: 19 U/L (ref 0–44)
AST: 22 U/L (ref 15–41)
Albumin: 3.7 g/dL (ref 3.5–5.0)
Alkaline Phosphatase: 68 U/L (ref 38–126)
Anion gap: 4 — ABNORMAL LOW (ref 5–15)
BUN: 35 mg/dL — ABNORMAL HIGH (ref 8–23)
CO2: 28 mmol/L (ref 22–32)
Calcium: 9.1 mg/dL (ref 8.9–10.3)
Chloride: 108 mmol/L (ref 98–111)
Creatinine: 1.3 mg/dL — ABNORMAL HIGH (ref 0.44–1.00)
GFR, Estimated: 45 mL/min — ABNORMAL LOW (ref >60)
Glucose, Bld: 132 mg/dL — ABNORMAL HIGH (ref 70–99)
Potassium: 4.6 mmol/L (ref 3.5–5.1)
Sodium: 140 mmol/L (ref 135–145)
Total Bilirubin: 0.4 mg/dL (ref 0.3–1.2)
Total Protein: 6.4 g/dL — ABNORMAL LOW (ref 6.5–8.1)

## 2023-04-23 LAB — CBC WITH DIFFERENTIAL (CANCER CENTER ONLY)
Abs Immature Granulocytes: 0.01 10*3/uL (ref 0.00–0.07)
Basophils Absolute: 0 10*3/uL (ref 0.0–0.1)
Basophils Relative: 0 %
Eosinophils Absolute: 0.1 10*3/uL (ref 0.0–0.5)
Eosinophils Relative: 1 %
HCT: 38.9 % (ref 36.0–46.0)
Hemoglobin: 11.8 g/dL — ABNORMAL LOW (ref 12.0–15.0)
Immature Granulocytes: 0 %
Lymphocytes Relative: 19 %
Lymphs Abs: 1.4 10*3/uL (ref 0.7–4.0)
MCH: 28.2 pg (ref 26.0–34.0)
MCHC: 30.3 g/dL (ref 30.0–36.0)
MCV: 92.8 fL (ref 80.0–100.0)
Monocytes Absolute: 0.6 10*3/uL (ref 0.1–1.0)
Monocytes Relative: 9 %
Neutro Abs: 5.1 10*3/uL (ref 1.7–7.7)
Neutrophils Relative %: 71 %
Platelet Count: 242 10*3/uL (ref 150–400)
RBC: 4.19 MIL/uL (ref 3.87–5.11)
RDW: 16.3 % — ABNORMAL HIGH (ref 11.5–15.5)
WBC Count: 7.2 10*3/uL (ref 4.0–10.5)
nRBC: 0 % (ref 0.0–0.2)

## 2023-04-23 LAB — IRON AND IRON BINDING CAPACITY (CC-WL,HP ONLY)
Iron: 114 ug/dL (ref 28–170)
Saturation Ratios: 41 % — ABNORMAL HIGH (ref 10.4–31.8)
TIBC: 277 ug/dL (ref 250–450)
UIBC: 163 ug/dL

## 2023-04-23 LAB — FERRITIN: Ferritin: 94 ng/mL (ref 11–307)

## 2023-04-23 LAB — VITAMIN B12: Vitamin B-12: 952 pg/mL — ABNORMAL HIGH (ref 180–914)

## 2023-04-23 MED ORDER — EPOETIN ALFA-EPBX 40000 UNIT/ML IJ SOLN
40000.0000 [IU] | Freq: Once | INTRAMUSCULAR | Status: AC
  Administered 2023-04-23: 40000 [IU] via SUBCUTANEOUS
  Filled 2023-04-23: qty 1

## 2023-04-23 NOTE — Telephone Encounter (Signed)
H&V Care Navigation CSW Progress Note  Clinical Social Worker contacted patient by phone to f/u on community resources for grief support. No answer this morning at [phone], I have left a voicemail. Will re-attempt as able and mail resources if interested.  Patient is participating in a Managed Medicaid Plan:  No, [employer] Medicare  SDOH Screenings   Food Insecurity: No Food Insecurity (09/27/2022)  Housing: Low Risk  (09/27/2022)  Transportation Needs: No Transportation Needs (09/27/2022)  Utilities: Not At Risk (09/27/2022)  Tobacco Use: Low Risk  (04/22/2023)   Jocelyn Kaufman, MSW, LCSW Clinical Social Worker II [facility] Heart/Vascular Care Navigation  [phone]- work cell phone (preferred) [phone]- desk phone

## 2023-04-23 NOTE — Progress Notes (Signed)
[facility] PREP Weekly Session  Patient Details  Name: Jocelyn Kaufman MRN: [id number] Date of Birth: 01/08/1956 Age: 67 y.o. PCP: Merri Brunette, MD  Vitals:   04/22/23 1030  Weight: 225 lb (102.1 kg)     [facility] Weekly seesion - 04/23/23 1000       [facility] "PREP" Location   [facility] "PREP" Location [facility]      Weekly Session   Topic Discussed Calorie breakdown    Minutes exercised this week 100 minutes   3 sessions of weight training, 3 sessions of stretching   Classes attended to date 27             Pam Jerral Bonito 04/23/2023, 10:25 AM

## 2023-04-24 ENCOUNTER — Telehealth: Payer: Self-pay | Admitting: Licensed Clinical Social Worker

## 2023-04-24 DIAGNOSIS — H903 Sensorineural hearing loss, bilateral: Secondary | ICD-10-CM | POA: Insufficient documentation

## 2023-04-24 NOTE — Telephone Encounter (Signed)
H&V Care Navigation CSW Progress Note  Clinical Social Worker contacted patient by phone again today to f/u on community resources for grief support. No answer again this morning at [phone], I have left a voicemail x2, pt returned my call when I had left the clinic for the day. Will re-attempt as able and mail resources if interested.   Patient is participating in a Managed Medicaid Plan:  No, [employer] Medicare  SDOH Screenings   Food Insecurity: No Food Insecurity (09/27/2022)  Housing: Low Risk  (09/27/2022)  Transportation Needs: No Transportation Needs (09/27/2022)  Utilities: Not At Risk (09/27/2022)  Tobacco Use: Low Risk  (04/22/2023)   Jocelyn Kaufman, MSW, LCSW Clinical Social Worker II [facility] Heart/Vascular Care Navigation  [phone]- work cell phone (preferred) [phone]- desk phone

## 2023-04-25 ENCOUNTER — Telehealth: Payer: Self-pay | Admitting: Licensed Clinical Social Worker

## 2023-04-25 DIAGNOSIS — S76111D Strain of right quadriceps muscle, fascia and tendon, subsequent encounter: Secondary | ICD-10-CM | POA: Diagnosis not present

## 2023-04-25 DIAGNOSIS — R262 Difficulty in walking, not elsewhere classified: Secondary | ICD-10-CM | POA: Diagnosis not present

## 2023-04-25 DIAGNOSIS — M25661 Stiffness of right knee, not elsewhere classified: Secondary | ICD-10-CM | POA: Diagnosis not present

## 2023-04-25 DIAGNOSIS — N182 Chronic kidney disease, stage 2 (mild): Secondary | ICD-10-CM | POA: Diagnosis not present

## 2023-04-25 DIAGNOSIS — M6281 Muscle weakness (generalized): Secondary | ICD-10-CM | POA: Diagnosis not present

## 2023-04-25 NOTE — Progress Notes (Signed)
Heart and Vascular Care Navigation  04/25/2023  Jocelyn Kaufman 01-19-1956 [id number]  Reason for Referral: mental health Kaufman Patient is participating in a Managed Medicaid Plan: No, [employer] Medicare only  Engaged with patient by telephone for initial visit for Heart and Vascular Care Coordination.                                                                                                   Assessment:    LCSW spoke with pt this afternoon as she returned my call. Introduced self, role, reason for call. Confirmed home address, PCP, and contact information. Discussed referral from team at [facility]. Pt shares that she is interested in support for multiple areas including Jocelyn stress, health concerns and grief. She is not sure if the grief support would be too specific. She has not heard back from insurance company about available providers in network with her Medicare PPO plan. I discussed several options with pt. She requests I send Kaufman to her email. Denies any issues with transportation, housing costs, food, or medications. LCSW sent Kaufman below.                                     HRT/VAS Care Coordination     Patients Home Cardiology Office Jocelyn Kaufman  DWB   Outpatient Care Team Social Worker   Social Worker Name: Jocelyn Kaufman, [place], [phone]   Living arrangements for the past 2 months Single Jocelyn Home   Lives with: Self   Patient Current Insurance Coverage Managed Medicare   Patient Has Concern With Paying Medical Bills No   Does Patient Have Prescription Coverage? Yes   Home Assistive Devices/Equipment Walker (specify type); CBG Meter; Blood pressure cuff; Cane (specify quad or straight); Shower chair with back   Current home services Other (comment)  none       Social History:                                                                             SDOH Screenings   Food Insecurity: No Food Insecurity (04/25/2023)  Housing: Low Risk  (04/25/2023)   Transportation Needs: No Transportation Needs (04/25/2023)  Utilities: Not At Risk (04/25/2023)  Financial Resource Strain: Low Risk  (04/25/2023)  Stress: Stress Concern Present (04/25/2023)  Tobacco Use: Low Risk  (04/22/2023)    SDOH Interventions: Financial Kaufman:  Financial Strain Interventions: Intervention Not Indicated  Food Insecurity:  Food Insecurity Interventions: Intervention Not Indicated  Housing Insecurity:  Housing Interventions: Intervention Not Indicated  Transportation:   Transportation Interventions: Intervention Not Indicated    Other Care Navigation Interventions:     Provided Pharmacy assistance Kaufman  Pt denies any issues with obtaining or affording medications at this  time.   Patient expressed Mental Health concerns Yes, Referred to:  see below   Follow-up plan:   Sent pt the following email with attachments noted and Kaufman. I also mailed pt a copy of my card.  Hi Ms. Early Kaufman, Thank you for your time this morning.  I know you have a full plate but I hope these Kaufman may be helpful with guiding the process of finding community support.  I have attached several Kaufman and will include several links to additional Kaufman/agencies.   First attachment is the [facility]oc[facility]), it is a community partnership between Jocelyn Kaufman and Jocelyn [facility]. This resource we provide all patients who may need support outside of traditional clinic hours or in case of crisis. They have 24/7 walk in services should you need immediate supportive mental health assistance.   Second, is a Jocelyn Kaufman, this packet has a few agencies that have since printing closed or merged with other agencies so I encourage patients to call before going to any clinics to ensure you have the most up to date information.   Third attachment, is Jocelyn Kaufman, the team at Jocelyn Kaufman provide individual, group, and  event support for those who have had losses. There is no time constraint, and individuals do not have to have received services in the past. They also can provide referrals for additional mental health needs that they are unable to address as part of grief Kaufman.   Fourth attachment (with the funny name on the attachment) is a list of providers that I was able to find on the Jocelyn Kaufman website searching for those in network with the PPO plan within 15 miles of [address] zip code.   There are two local agencies that also may be of interest: Jocelyn Kaufman: [email or web address] ([email or web address]: [facility] [place] Kaufman  Mental Health and Substance Use Kauf[facility] - [facility] [place] [facility]  If none of these work I encourage patients to search on Psychology Today, as I mentioned you can search by areas of support needed, insurance provider and read information about the provider. These are some of the therapists that I found using filters for Landa and Medicare: Find [employer] Kaufman and Psychologists in [place], [place] - Psychology Today  Let me know if I can be of any further assistance! Jocelyn Kaufman

## 2023-04-30 DIAGNOSIS — S76111D Strain of right quadriceps muscle, fascia and tendon, subsequent encounter: Secondary | ICD-10-CM | POA: Diagnosis not present

## 2023-04-30 DIAGNOSIS — E1122 Type 2 diabetes mellitus with diabetic chronic kidney disease: Secondary | ICD-10-CM | POA: Diagnosis not present

## 2023-04-30 DIAGNOSIS — I129 Hypertensive chronic kidney disease with stage 1 through stage 4 chronic kidney disease, or unspecified chronic kidney disease: Secondary | ICD-10-CM | POA: Diagnosis not present

## 2023-04-30 DIAGNOSIS — E1129 Type 2 diabetes mellitus with other diabetic kidney complication: Secondary | ICD-10-CM | POA: Diagnosis not present

## 2023-04-30 DIAGNOSIS — N1831 Chronic kidney disease, stage 3a: Secondary | ICD-10-CM | POA: Diagnosis not present

## 2023-04-30 DIAGNOSIS — R809 Proteinuria, unspecified: Secondary | ICD-10-CM | POA: Diagnosis not present

## 2023-04-30 NOTE — Progress Notes (Signed)
[facility] PREP Weekly Session  Patient Details  Name: Jocelyn Kaufman MRN: [id number] Date of Birth: 1956/04/19 Age: 67 y.o. PCP: Merri Brunette, MD  Vitals:   04/29/23 1300  Weight: 225 lb (102.1 kg)     [facility] Weekly seesion - 04/30/23 0900       [facility] "PREP" Location   [facility]P" [facility]      Weekly Session   Topic Discussed Hitting roadblocks    Minutes exercised this week 245 minutes    Classes attended to date 17             [name] 04/30/2023, 9:57 AM

## 2023-05-01 ENCOUNTER — Telehealth: Payer: Self-pay | Admitting: Licensed Clinical Social Worker

## 2023-05-01 ENCOUNTER — Encounter (HOSPITAL_BASED_OUTPATIENT_CLINIC_OR_DEPARTMENT_OTHER): Payer: Self-pay

## 2023-05-01 NOTE — Telephone Encounter (Signed)
H&V Care Navigation CSW Progress Note  Clinical Social Worker contacted patient by phone to f/u on resources sent to pt. Was able to reach her at [phone], she confirms she received resources. She was encouraged to reach out with any additional questions or concerns. None at this time.  Patient is participating in a Managed Medicaid Plan:  No, [employer] Medicare only  SDOH Screenings   Food Insecurity: No Food Insecurity (04/25/2023)  Housing: Low Risk  (04/25/2023)  Transportation Needs: No Transportation Needs (04/25/2023)  Utilities: Not At Risk (04/25/2023)  Financial Resource Strain: Low Risk  (04/25/2023)  Stress: Stress Concern Present (04/25/2023)  Tobacco Use: Low Risk  (04/22/2023)   Octavio Graves, MSW, LCSW Clinical Social Worker [facility] Heart/Vascular Care Navigation  [phone]- work cell phone (preferred) [phone]- desk phone

## 2023-05-06 DIAGNOSIS — M6281 Muscle weakness (generalized): Secondary | ICD-10-CM | POA: Diagnosis not present

## 2023-05-06 DIAGNOSIS — S46011D Strain of muscle(s) and tendon(s) of the rotator cuff of right shoulder, subsequent encounter: Secondary | ICD-10-CM | POA: Diagnosis not present

## 2023-05-06 DIAGNOSIS — M25611 Stiffness of right shoulder, not elsewhere classified: Secondary | ICD-10-CM | POA: Diagnosis not present

## 2023-05-08 DIAGNOSIS — R1012 Left upper quadrant pain: Secondary | ICD-10-CM | POA: Diagnosis not present

## 2023-05-08 DIAGNOSIS — R1013 Epigastric pain: Secondary | ICD-10-CM | POA: Diagnosis not present

## 2023-05-09 DIAGNOSIS — M6281 Muscle weakness (generalized): Secondary | ICD-10-CM | POA: Diagnosis not present

## 2023-05-09 DIAGNOSIS — S46011D Strain of muscle(s) and tendon(s) of the rotator cuff of right shoulder, subsequent encounter: Secondary | ICD-10-CM | POA: Diagnosis not present

## 2023-05-09 DIAGNOSIS — M25611 Stiffness of right shoulder, not elsewhere classified: Secondary | ICD-10-CM | POA: Diagnosis not present

## 2023-05-13 NOTE — Progress Notes (Signed)
[facility] PREP Evaluation  Patient Details  Name: ROSALEE Kaufman MRN: [id number] Date of Birth: 1955/11/15 Age: 67 y.o. PCP: Merri Brunette, MD  Vitals:   05/13/23 0945  BP: 126/60  Pulse: 62  SpO2: 96%  Weight: 228 lb 9.6 oz (103.7 kg)     [facility] Eval - 05/13/23 1100       [facility] "PREP" Location   [facility]PREP[facility] Location [facility]      Referral    Referring Provider Walker    Program Start Date 02/11/23    Program End Date 05/13/23      Measurement   Waist Circumference 46.5 inches    Waist Circumference End Program 46 inches    Hip Circumference 53 inches    Hip Circumference End Program 53.5 inches    Body fat 45 percent      Information for Trainer   Goals Plans to cont to exercise      Mobility and Daily Activities   I find it easy to walk up or down two or more flights of stairs. 1    I have no trouble taking out the trash. 3    I do housework such as vacuuming and dusting on my own without difficulty. 2    I can easily lift a gallon of milk (8lbs). 2    I can easily walk a mile. 1    I have no trouble reaching into high cupboards or reaching down to pick up something from the floor. 2    I do not have trouble doing out-door work such as Loss adjuster, chartered, raking leaves, or gardening. 1      Mobility and Daily Activities   I feel younger than my age. 2    I feel independent. 4    I feel energetic. 2    I live an active life.  1    I feel strong. 2    I feel healthy. 2    I feel active as other people my age. 2      How fit and strong are you.   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   Urticaria    Past Surgical History:  Procedure Laterality Date   ADENOIDECTOMY     APPENDECTOMY     biopsy Left 12/2021   shin   biospy Right 04/2021   R 5th digit   biospy Left 04/2021   foot plantar surface   colonoscopy and endoscopy  2021   CYST REMOVAL LEG Right    Removed from right thigh joint   EYE SURGERY Bilateral 09/2021   cataract   foot surgery Left 09/2021   Excision lesion plantar left foot   LIGAMENT REPAIR Right 09/26/2022   Procedure: LIGAMENT REPAIR/knee;  Surgeon: Bjorn Pippin, MD;  Location: [facility] OR;  Service: Orthopedics;  Laterality: Right;   ORIF HUMERUS FRACTURE Right 07/09/2018   Procedure: OPEN REDUCTION INTERNAL FIXATION (ORIF) HUMERAL SHAFT FRACTURE;  Surgeon: Bjorn Pippin, MD;  Location: [facility] OR;  Service: Orthopedics;  Laterality: Right;   QUADRICEPS TENDON REPAIR Right 09/26/2022   Procedure: REVISION REPAIR QUADRICEP TENDON;  Surgeon: Bjorn Pippin, MD;  Location: [facility] OR;  Service: Orthopedics;  Laterality: Right;   TONSILLECTOMY     torn right knee tendon repair Right    2023   TOTAL VAGINAL HYSTERECTOMY     UMBILICAL HERNIA REPAIR     Social History   Tobacco Use  Smoking Status Never  Smokeless Tobacco Never  Attended 20 sessions/11 educational Fit testing: Cardio march: 156 to 253 Sit to stand: 1 assisted (hands down) to 13 Bicep curls: 12 to 21 Balance improved.  Overall feels better. Seeing PT for right shoulder. Plans on continuing to exercise. Encouraged to strength train as well. Use Cardio march, sit to stand and bicep curls as a mini workout.    [name] 05/13/2023, 11:20 AM

## 2023-05-14 ENCOUNTER — Other Ambulatory Visit: Payer: Self-pay

## 2023-05-14 ENCOUNTER — Inpatient Hospital Stay: Payer: Medicare PPO

## 2023-05-14 ENCOUNTER — Inpatient Hospital Stay: Payer: Medicare PPO | Attending: Hematology and Oncology

## 2023-05-14 DIAGNOSIS — D631 Anemia in chronic kidney disease: Secondary | ICD-10-CM | POA: Diagnosis not present

## 2023-05-14 DIAGNOSIS — N183 Chronic kidney disease, stage 3 unspecified: Secondary | ICD-10-CM | POA: Insufficient documentation

## 2023-05-14 DIAGNOSIS — E1122 Type 2 diabetes mellitus with diabetic chronic kidney disease: Secondary | ICD-10-CM | POA: Diagnosis not present

## 2023-05-14 LAB — CMP (CANCER CENTER ONLY)
ALT: 29 U/L (ref 0–44)
AST: 29 U/L (ref 15–41)
Albumin: 3.6 g/dL (ref 3.5–5.0)
Alkaline Phosphatase: 57 U/L (ref 38–126)
Anion gap: 6 (ref 5–15)
BUN: 33 mg/dL — ABNORMAL HIGH (ref 8–23)
CO2: 24 mmol/L (ref 22–32)
Calcium: 8.9 mg/dL (ref 8.9–10.3)
Chloride: 109 mmol/L (ref 98–111)
Creatinine: 1.08 mg/dL — ABNORMAL HIGH (ref 0.44–1.00)
GFR, Estimated: 56 mL/min — ABNORMAL LOW (ref >60)
Glucose, Bld: 85 mg/dL (ref 70–99)
Potassium: 5 mmol/L (ref 3.5–5.1)
Sodium: 139 mmol/L (ref 135–145)
Total Bilirubin: 0.4 mg/dL (ref 0.3–1.2)
Total Protein: 6 g/dL — ABNORMAL LOW (ref 6.5–8.1)

## 2023-05-14 LAB — CBC WITH DIFFERENTIAL (CANCER CENTER ONLY)
Abs Immature Granulocytes: 0.01 10*3/uL (ref 0.00–0.07)
Basophils Absolute: 0 10*3/uL (ref 0.0–0.1)
Basophils Relative: 1 %
Eosinophils Absolute: 0.1 10*3/uL (ref 0.0–0.5)
Eosinophils Relative: 2 %
HCT: 38.5 % (ref 36.0–46.0)
Hemoglobin: 11.8 g/dL — ABNORMAL LOW (ref 12.0–15.0)
Immature Granulocytes: 0 %
Lymphocytes Relative: 27 %
Lymphs Abs: 1.6 10*3/uL (ref 0.7–4.0)
MCH: 28.6 pg (ref 26.0–34.0)
MCHC: 30.6 g/dL (ref 30.0–36.0)
MCV: 93.4 fL (ref 80.0–100.0)
Monocytes Absolute: 0.4 10*3/uL (ref 0.1–1.0)
Monocytes Relative: 7 %
Neutro Abs: 3.7 10*3/uL (ref 1.7–7.7)
Neutrophils Relative %: 63 %
Platelet Count: 126 10*3/uL — ABNORMAL LOW (ref 150–400)
RBC: 4.12 MIL/uL (ref 3.87–5.11)
RDW: 15.9 % — ABNORMAL HIGH (ref 11.5–15.5)
WBC Count: 5.8 10*3/uL (ref 4.0–10.5)
nRBC: 0 % (ref 0.0–0.2)

## 2023-05-14 LAB — VITAMIN B12: Vitamin B-12: 986 pg/mL — ABNORMAL HIGH (ref 180–914)

## 2023-05-14 LAB — IRON AND IRON BINDING CAPACITY (CC-WL,HP ONLY)
Iron: 135 ug/dL (ref 28–170)
Saturation Ratios: 54 % — ABNORMAL HIGH (ref 10.4–31.8)
TIBC: 252 ug/dL (ref 250–450)
UIBC: 117 ug/dL — ABNORMAL LOW (ref 148–442)

## 2023-05-14 LAB — FERRITIN: Ferritin: 145 ng/mL (ref 11–307)

## 2023-05-14 NOTE — Progress Notes (Signed)
Patient here for retacrit injection, Hgb 11.8 today. Labs printed off and given to patient.

## 2023-05-16 DIAGNOSIS — M25611 Stiffness of right shoulder, not elsewhere classified: Secondary | ICD-10-CM | POA: Diagnosis not present

## 2023-05-16 DIAGNOSIS — S46011D Strain of muscle(s) and tendon(s) of the rotator cuff of right shoulder, subsequent encounter: Secondary | ICD-10-CM | POA: Diagnosis not present

## 2023-05-16 DIAGNOSIS — M6281 Muscle weakness (generalized): Secondary | ICD-10-CM | POA: Diagnosis not present

## 2023-05-20 DIAGNOSIS — H25813 Combined forms of age-related cataract, bilateral: Secondary | ICD-10-CM | POA: Diagnosis not present

## 2023-05-20 DIAGNOSIS — E113512 Type 2 diabetes mellitus with proliferative diabetic retinopathy with macular edema, left eye: Secondary | ICD-10-CM | POA: Diagnosis not present

## 2023-05-20 DIAGNOSIS — H3582 Retinal ischemia: Secondary | ICD-10-CM | POA: Diagnosis not present

## 2023-05-20 DIAGNOSIS — E113491 Type 2 diabetes mellitus with severe nonproliferative diabetic retinopathy without macular edema, right eye: Secondary | ICD-10-CM | POA: Diagnosis not present

## 2023-05-20 DIAGNOSIS — H31093 Other chorioretinal scars, bilateral: Secondary | ICD-10-CM | POA: Diagnosis not present

## 2023-05-23 DIAGNOSIS — M6281 Muscle weakness (generalized): Secondary | ICD-10-CM | POA: Diagnosis not present

## 2023-05-23 DIAGNOSIS — M25611 Stiffness of right shoulder, not elsewhere classified: Secondary | ICD-10-CM | POA: Diagnosis not present

## 2023-05-23 DIAGNOSIS — S46011D Strain of muscle(s) and tendon(s) of the rotator cuff of right shoulder, subsequent encounter: Secondary | ICD-10-CM | POA: Diagnosis not present

## 2023-05-27 DIAGNOSIS — L750 Bromhidrosis: Secondary | ICD-10-CM | POA: Diagnosis not present

## 2023-05-27 DIAGNOSIS — L668 Other cicatricial alopecia: Secondary | ICD-10-CM | POA: Insufficient documentation

## 2023-05-27 DIAGNOSIS — E1129 Type 2 diabetes mellitus with other diabetic kidney complication: Secondary | ICD-10-CM | POA: Diagnosis not present

## 2023-05-27 DIAGNOSIS — N1831 Chronic kidney disease, stage 3a: Secondary | ICD-10-CM | POA: Diagnosis not present

## 2023-05-27 DIAGNOSIS — L658 Other specified nonscarring hair loss: Secondary | ICD-10-CM | POA: Insufficient documentation

## 2023-05-27 DIAGNOSIS — L299 Pruritus, unspecified: Secondary | ICD-10-CM | POA: Diagnosis not present

## 2023-05-28 DIAGNOSIS — M0579 Rheumatoid arthritis with rheumatoid factor of multiple sites without organ or systems involvement: Secondary | ICD-10-CM | POA: Diagnosis not present

## 2023-05-28 DIAGNOSIS — L750 Bromhidrosis: Secondary | ICD-10-CM | POA: Insufficient documentation

## 2023-05-30 DIAGNOSIS — S46011D Strain of muscle(s) and tendon(s) of the rotator cuff of right shoulder, subsequent encounter: Secondary | ICD-10-CM | POA: Diagnosis not present

## 2023-05-30 DIAGNOSIS — M25611 Stiffness of right shoulder, not elsewhere classified: Secondary | ICD-10-CM | POA: Diagnosis not present

## 2023-05-30 DIAGNOSIS — M6281 Muscle weakness (generalized): Secondary | ICD-10-CM | POA: Diagnosis not present

## 2023-06-03 NOTE — Progress Notes (Signed)
Patient Care Team: Merri Brunette, MD as PCP - General (Internal Medicine) Rennis Golden Lisette Abu, MD as PCP - Cardiology (Cardiology) Serena Croissant, MD as Consulting Physician (Hematology and Oncology) Casimer Lanius, MD as Consulting Physician (Rheumatology) Marlene Lard, MD as Consulting Physician (Endocrinology) Janet Berlin, MD as Consulting Physician (Ophthalmology) Estanislado Emms, MD as Consulting Physician (Nephrology)  DIAGNOSIS:  Encounter Diagnosis  Name Primary?   Anemia in stage 3 chronic kidney disease, unspecified whether stage 3a or 3b CKD (HCC) Yes      CHIEF COMPLIANT:  Follow-up anemia   INTERVAL HISTORY: Jocelyn Kaufman is a  67 y.o. with above-mentioned history of anemia of chronic kidney disease. She presents to the clinic today for a follow-up. Patient reports today that she is very tired.    ALLERGIES:  is allergic to semaglutide(0.25 or 0.5mg -dos), shellfish-derived products, empagliflozin, semaglutide, tramadol hcl, canagliflozin, latex, liraglutide, minoxidil, omeprazole, and percocet [oxycodone-acetaminophen].  MEDICATIONS:  Current Outpatient Medications  Medication Sig Dispense Refill   ACCU-CHEK GUIDE test strip      Accu-Chek Softclix Lancets lancets use to check blood sugar     Aflibercept (EYLEA) 2 MG/0.05ML SOLN 2 mg by Intravitreal route every 3 (three) months.     albuterol (VENTOLIN HFA) 108 (90 Base) MCG/ACT inhaler Inhale two puffs every four to six hours as needed for cough or wheeze. (Patient taking differently: Inhale 1-2 puffs into the lungs every 6 (six) hours as needed for wheezing.) 18 g 2   amLODipine (NORVASC) 10 MG tablet Take 1 tablet (10 mg total) by mouth daily.     aspirin EC 81 MG tablet Take 81 mg by mouth daily. Swallow whole.     atorvastatin (LIPITOR) 40 MG tablet Take 1 tablet (40 mg total) by mouth daily at 6 PM. (Patient taking differently: Take 40 mg by mouth daily.) 30 tablet 0   B-D ULTRAFINE III SHORT PEN 31G X 8 MM  MISC SMARTSIG:1 Each SUB-Q Daily     CHELATED IRON PO Take 18 mg by mouth daily.     Cholecalciferol (VITAMIN D3) 125 MCG (5000 UT) CAPS Take 5,000 Units by mouth daily.     clindamycin (CLEOCIN T) 1 % external solution Apply to underarm daily to twice as needed.     Dulaglutide (TRULICITY) 1.5 MG/0.5ML SOPN Inject 1.5 mg into the skin once a week. 2 mL 5   EPINEPHrine (EPIPEN 2-PAK) 0.3 mg/0.3 mL IJ SOAJ injection Use as directed for life-threatening allergic reaction. 2 each 1   epoetin alfa-epbx (RETACRIT) 2000 UNIT/ML injection See admin instructions.     escitalopram (LEXAPRO) 20 MG tablet Take 20 mg by mouth daily.     ezetimibe (ZETIA) 10 MG tablet Take 1 tablet (10 mg total) by mouth daily.     fexofenadine (ALLEGRA ALLERGY) 180 MG tablet Take 1 tablet (180 mg total) by mouth daily.     folic acid (FOLVITE) 1 MG tablet Take 2 mg by mouth daily.     hydrALAZINE (APRESOLINE) 50 MG tablet Take 50 mg by mouth 3 (three) times daily.     hydrocortisone cream 1 % Apply 1 Application topically 2 (two) times daily as needed for itching.     inFLIXimab (REMICADE IV) Inject into the vein.     insulin aspart (NOVOLOG) 100 UNIT/ML FlexPen Inject into the skin See admin instructions. Sliding Scale     insulin glargine, 2 Unit Dial, (TOUJEO MAX SOLOSTAR) 300 UNIT/ML Solostar Pen Inject 42 Units into the skin at bedtime.  irbesartan (AVAPRO) 300 MG tablet Take 1 tablet (300 mg total) by mouth daily.     KERENDIA 10 MG TABS 10 mg daily.     Lancets Misc. (ACCU-CHEK SOFTCLIX LANCET DEV) KIT See admin instructions.     latanoprost (XALATAN) 0.005 % ophthalmic solution Place 1 drop into both eyes at bedtime.     lidocaine (LMX) 4 % cream Apply 1 Application topically as needed (pain).     Methotrexate Sodium (METHOTREXATE, PF,) 50 MG/2ML injection Inject 20 mg into the muscle every Wednesday. 0.8 ml     metoprolol succinate (TOPROL-XL) 25 MG 24 hr tablet Take 1 tablet (25 mg total) by mouth daily. May  take additional half tablet as needed once daily for palpitations. (Patient taking differently: Take 12.5-25 mg by mouth See admin instructions. Take 25 mg by mouth once a day and an additional 12.5 mg (half tablet) as needed once daily for palpitations) 135 tablet 3   Multiple Vitamins-Minerals (CENTRUM SILVER PO) Take 1 tablet by mouth daily with breakfast.     sulfaSALAzine (AZULFIDINE) 500 MG tablet Take 3 tablets (1,500 mg total) by mouth 2 (two) times daily.     SYNTHROID 125 MCG tablet Take 125 mcg by mouth every Monday, Tuesday, Wednesday, Thursday, and Friday.     triamcinolone cream (KENALOG) 0.1 % Apply 1 Application topically 2 (two) times daily as needed (for itching- affected areas).     vitamin B-12 (CYANOCOBALAMIN) 1000 MCG tablet Take 1,000 mcg by mouth daily.     chlorthalidone (HYGROTON) 25 MG tablet Take 0.5 tablets (12.5 mg total) by mouth daily. 15 tablet 2   No current facility-administered medications for this visit.   Facility-Administered Medications Ordered in Other Visits  Medication Dose Route Frequency Provider Last Rate Last Admin   epoetin alfa-epbx (RETACRIT) injection 40,000 Units  40,000 Units Subcutaneous Once Loa Socks, NP        PHYSICAL EXAMINATION: ECOG PERFORMANCE STATUS: 1 - Symptomatic but completely ambulatory  Vitals:   06/04/23 1414  BP: (!) 148/63  Pulse: 68  Resp: 18  Temp: (!) 97.2 F (36.2 C)  SpO2: 95%   Filed Weights   06/04/23 1414  Weight: 228 lb 12.8 oz (103.8 kg)      LABORATORY DATA:  I have reviewed the data as listed    Latest Ref Rng & Units 05/14/2023   12:57 PM 04/23/2023    3:40 PM 10/04/2022    7:15 AM  CMP  Glucose 70 - 99 mg/dL 85  841  324   BUN 8 - 23 mg/dL 33  35  35   Creatinine 0.44 - 1.00 mg/dL 4.01  0.27  2.53   Sodium 135 - 145 mmol/L 139  140  135   Potassium 3.5 - 5.1 mmol/L 5.0  4.6  4.8   Chloride 98 - 111 mmol/L 109  108  107   CO2 22 - 32 mmol/L 24  28  24    Calcium 8.9 - 10.3  mg/dL 8.9  9.1  8.4   Total Protein 6.5 - 8.1 g/dL 6.0  6.4    Total Bilirubin 0.3 - 1.2 mg/dL 0.4  0.4    Alkaline Phos 38 - 126 U/L 57  68    AST 15 - 41 U/L 29  22    ALT 0 - 44 U/L 29  19      Lab Results  Component Value Date   WBC 7.1 06/04/2023   HGB 10.7 (L) 06/04/2023  HCT 33.6 (L) 06/04/2023   MCV 92.3 06/04/2023   PLT 145 (L) 06/04/2023   NEUTROABS 4.9 06/04/2023    ASSESSMENT & PLAN:  Anemia in chronic kidney disease Patient has history of hypothyroidism as well as possible adrenal dysfunction issues for which [facility]   Previous blood work review: 1.  Erythropoietin: 24.2 2. SPEP: No M protein 3.  Haptoglobin: 146, LDH 281, absolute reticulocyte count 96.4, direct Coombs test negative 4.  B12 750 5.  Folic acid 23.5 6.  Ferritin 88, iron saturation 25 7. 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  (Retacrit injections for hemoglobin 11 or less) Lab review 03/23/2020: Erythropoietin 27.9, hemoglobin 9.6 02/02/2021: Hemoglobin 10.6 (given her severe symptoms continue with Aranesp) 06/22/2021: Hemoglobin 11.3  11/07/2021: Hemoglobin 11.1 11/21/2021: Hemoglobin 10.5 (no Retacrit today) 01/02/2022: Hemoglobin 10.7 (no Retacrit today): Last Retacrit was on 12/05/2021  02/01/2022: Hemoglobin 10.3: Proceeding to Retacrit today 05/24/2022: Hemoglobin 10.4: Retacrit being given today 08/31/2022: Hemoglobin 10.1 09/19/2022: Hemoglobin 11.2 (Retacrit given 08/31/2022) 12/18/2022: Hemoglobin 10.6 (recommended that she receive Retacrit today) 01/29/2023: Hemoglobin 11.2 (held Retacrit) 06/04/2019: Hemoglobin 10.7  Previous Retacrit dose: 04/23/2023   Knee replacement surgery November 2023 spent  7 weeks in rehab.   Recheck labs every 3 weeks for lab and Retacrit.  MD visit every 6 weeks    No orders of the defined types were placed in this encounter.  The patient has a good understanding of the overall plan. she agrees with it. she will call with any problems that may develop before the next visit here. Total time spent: 30 mins including face to face time and time spent for planning, charting and co-ordination of care   Tamsen Meek, MD 06/04/23    I Janan Ridge am acting as a Neurosurgeon for [employer]  I have reviewed the above documentation for accuracy and completeness, and I agree with the above.

## 2023-06-04 ENCOUNTER — Inpatient Hospital Stay: Payer: Medicare PPO

## 2023-06-04 ENCOUNTER — Ambulatory Visit: Payer: Medicare PPO | Admitting: Podiatry

## 2023-06-04 ENCOUNTER — Encounter: Payer: Self-pay | Admitting: Podiatry

## 2023-06-04 ENCOUNTER — Inpatient Hospital Stay: Payer: Medicare PPO | Attending: Hematology and Oncology | Admitting: Hematology and Oncology

## 2023-06-04 VITALS — BP 148/63 | HR 68 | Temp 97.2°F | Resp 18 | Ht 64.5 in | Wt 228.8 lb

## 2023-06-04 DIAGNOSIS — R634 Abnormal weight loss: Secondary | ICD-10-CM | POA: Insufficient documentation

## 2023-06-04 DIAGNOSIS — N183 Chronic kidney disease, stage 3 unspecified: Secondary | ICD-10-CM | POA: Diagnosis not present

## 2023-06-04 DIAGNOSIS — L853 Xerosis cutis: Secondary | ICD-10-CM | POA: Diagnosis not present

## 2023-06-04 DIAGNOSIS — D631 Anemia in chronic kidney disease: Secondary | ICD-10-CM | POA: Insufficient documentation

## 2023-06-04 DIAGNOSIS — B351 Tinea unguium: Secondary | ICD-10-CM

## 2023-06-04 DIAGNOSIS — E1142 Type 2 diabetes mellitus with diabetic polyneuropathy: Secondary | ICD-10-CM

## 2023-06-04 DIAGNOSIS — M79675 Pain in left toe(s): Secondary | ICD-10-CM | POA: Diagnosis not present

## 2023-06-04 DIAGNOSIS — L84 Corns and callosities: Secondary | ICD-10-CM

## 2023-06-04 DIAGNOSIS — E1122 Type 2 diabetes mellitus with diabetic chronic kidney disease: Secondary | ICD-10-CM | POA: Insufficient documentation

## 2023-06-04 DIAGNOSIS — D638 Anemia in other chronic diseases classified elsewhere: Secondary | ICD-10-CM

## 2023-06-04 DIAGNOSIS — M79674 Pain in right toe(s): Secondary | ICD-10-CM | POA: Diagnosis not present

## 2023-06-04 DIAGNOSIS — D649 Anemia, unspecified: Secondary | ICD-10-CM

## 2023-06-04 DIAGNOSIS — R1013 Epigastric pain: Secondary | ICD-10-CM | POA: Insufficient documentation

## 2023-06-04 LAB — CBC WITH DIFFERENTIAL (CANCER CENTER ONLY)
Abs Immature Granulocytes: 0.03 10*3/uL (ref 0.00–0.07)
Basophils Absolute: 0 10*3/uL (ref 0.0–0.1)
Basophils Relative: 1 %
Eosinophils Absolute: 0.1 10*3/uL (ref 0.0–0.5)
Eosinophils Relative: 1 %
HCT: 33.6 % — ABNORMAL LOW (ref 36.0–46.0)
Hemoglobin: 10.7 g/dL — ABNORMAL LOW (ref 12.0–15.0)
Immature Granulocytes: 0 %
Lymphocytes Relative: 20 %
Lymphs Abs: 1.4 10*3/uL (ref 0.7–4.0)
MCH: 29.4 pg (ref 26.0–34.0)
MCHC: 31.8 g/dL (ref 30.0–36.0)
MCV: 92.3 fL (ref 80.0–100.0)
Monocytes Absolute: 0.6 10*3/uL (ref 0.1–1.0)
Monocytes Relative: 8 %
Neutro Abs: 4.9 10*3/uL (ref 1.7–7.7)
Neutrophils Relative %: 70 %
Platelet Count: 145 10*3/uL — ABNORMAL LOW (ref 150–400)
RBC: 3.64 MIL/uL — ABNORMAL LOW (ref 3.87–5.11)
RDW: 14.7 % (ref 11.5–15.5)
WBC Count: 7.1 10*3/uL (ref 4.0–10.5)
nRBC: 0 % (ref 0.0–0.2)

## 2023-06-04 MED ORDER — EPOETIN ALFA-EPBX 40000 UNIT/ML IJ SOLN
40000.0000 [IU] | Freq: Once | INTRAMUSCULAR | Status: AC
  Administered 2023-06-04: 40000 [IU] via SUBCUTANEOUS
  Filled 2023-06-04: qty 1

## 2023-06-04 NOTE — Progress Notes (Signed)
Subjective:  Patient ID: Jocelyn Kaufman, female    DOB: [birth year],  MRN: [id number]  Jocelyn Kaufman presents to clinic today for at risk foot care with history of diabetic neuropathy and painful thick toenails that are difficult to trim. Pain interferes with ambulation. Aggravating factors include wearing enclosed shoe gear. Pain is relieved with periodic professional debridement.  Chief Complaint  Patient presents with   Diabetes    [facility] BS - 93 A1C - 6.2 LVPCP - 02/2023   New problem(s): None.   PCP is Merri Brunette, MD.  Allergies  Allergen Reactions   Semaglutide(0.25 Or 0.5mg -Dos) Other (See Comments)    Causes cataracts requiring surgery and impairing vision    Shellfish-Derived Products Anaphylaxis, Swelling and Other (See Comments)    Patient avoids   Empagliflozin Itching, Other (See Comments) and Swelling    UTI sx's and yeast infections   Semaglutide Other (See Comments)    Other reaction(s): worsening DM RETINOPATHY   Tramadol Hcl Other (See Comments)    hallucinations   Canagliflozin Other (See Comments)    Urinary incontinence    Latex Rash and Other (See Comments)   Liraglutide Itching    Other reaction(s): itching all over   Minoxidil Rash    skin irritation   Omeprazole Rash and Other (See Comments)   Percocet [Oxycodone-Acetaminophen] Nausea And Vomiting    Review of Systems: Negative except as noted in the HPI.  Objective: No changes noted in today's physical examination. There were no vitals filed for this visit. Jocelyn Kaufman is a pleasant 67 y.o. female in NAD. AAO x 3.  Vascular Examination: Capillary refill time immediate b/l. Vascular status intact b/l with palpable pedal pulses. Pedal hair present b/l. No pain with calf compression b/l. Skin temperature gradient WNL b/l. No cyanosis or clubbing b/l. No ischemia or gangrene noted b/l.   Neurological Examination: Sensation grossly intact b/l with 10 gram monofilament. Vibratory sensation intact  b/l. Pt has subjective symptoms of neuropathy.  Dermatological Examination: Pedal skin with normal turgor, texture and tone b/l.  No open wounds. No interdigital macerations.   Toenails 1-5 b/l thick, discolored, elongated with subungual debris and pain on dorsal palpation.   Hyperkeratotic lesion(s) R 5th toe.  No erythema, no edema, no drainage, no fluctuance. Pedal skin noted to be dry both feet.  Musculoskeletal Examination: Muscle strength 5/5 to all lower extremity muscle groups bilaterally. 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  Radiographs: None  Last A1c:      Latest Ref Rng & Units 08/21/2022    7:56 AM  Hemoglobin A1C  Hemoglobin-A1c 4.8 - 5.6 % 5.6    Assessment/Plan: 1. Pain due to onychomycosis of toenails of both feet   2. Corns   3. Xerosis cutis   4. Diabetic peripheral neuropathy associated with type 2 diabetes mellitus (HCC)     -Patient was evaluated and treated. All patient's and/or POA's questions/concerns answered on today's visit. -Continue foot and shoe inspections daily. Monitor blood glucose per PCP/Endocrinologist's recommendations. -Patient to continue soft, supportive shoe gear daily. -Toenails 1-5 b/l were debrided in length and girth with sterile nail nippers and dremel without iatrogenic bleeding.  -For dry skin, recommended daily use of Bag Balm Hand and Body Moisturizer which may be purchased at local drug store or on Amazonor Remedy Silicone Cream from [employer] . -Patient/POA to call should there be question/concern in the interim.   Return in about 3 months (around 09/04/2023).  Lise Auer  Eloy End, DPM

## 2023-06-04 NOTE — Assessment & Plan Note (Signed)
Patient has history of hypothyroidism as well as possible adrenal dysfunction issues for which she goes to [facility]   Previous blood work review: 1.  Erythropoietin: 24.2 2. SPEP: No M protein 3.  Haptoglobin: 146, LDH 281, absolute reticulocyte count 96.4, direct Coombs test negative 4.  B12 750 5.  Folic acid 23.5 6.  Ferritin 88, iron saturation 25 7. 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  (Retacrit injections for hemoglobin 11 or less) Lab review 03/23/2020: Erythropoietin 27.9, hemoglobin 9.6 02/02/2021: Hemoglobin 10.6 (given her severe symptoms continue with Aranesp) 06/22/2021: Hemoglobin 11.3  11/07/2021: Hemoglobin 11.1 11/21/2021: Hemoglobin 10.5 (no Retacrit today) 01/02/2022: Hemoglobin 10.7 (no Retacrit today): Last Retacrit was on 12/05/2021  02/01/2022: Hemoglobin 10.3: Proceeding to Retacrit today 05/24/2022: Hemoglobin 10.4: Retacrit being given today 08/31/2022: Hemoglobin 10.1 09/19/2022: Hemoglobin 11.2 (Retacrit given 08/31/2022) 12/18/2022: Hemoglobin 10.6 (recommended that she receive Retacrit today) 01/29/2023: Hemoglobin 11.2 (holding Retacrit today)  Previous Retacrit dose: 04/23/2023   Knee replacement surgery November 2023 spent 7 weeks in rehab.   Recheck labs every 6 weeks for lab and Retacrit.  MD visit every 12 weeks

## 2023-06-06 DIAGNOSIS — M25611 Stiffness of right shoulder, not elsewhere classified: Secondary | ICD-10-CM | POA: Diagnosis not present

## 2023-06-06 DIAGNOSIS — S46011D Strain of muscle(s) and tendon(s) of the rotator cuff of right shoulder, subsequent encounter: Secondary | ICD-10-CM | POA: Diagnosis not present

## 2023-06-06 DIAGNOSIS — M6281 Muscle weakness (generalized): Secondary | ICD-10-CM | POA: Diagnosis not present

## 2023-06-12 ENCOUNTER — Ambulatory Visit: Payer: Medicare PPO | Admitting: Podiatry

## 2023-06-13 DIAGNOSIS — M25611 Stiffness of right shoulder, not elsewhere classified: Secondary | ICD-10-CM | POA: Diagnosis not present

## 2023-06-13 DIAGNOSIS — S46011D Strain of muscle(s) and tendon(s) of the rotator cuff of right shoulder, subsequent encounter: Secondary | ICD-10-CM | POA: Diagnosis not present

## 2023-06-13 DIAGNOSIS — M6281 Muscle weakness (generalized): Secondary | ICD-10-CM | POA: Diagnosis not present

## 2023-06-20 DIAGNOSIS — M6281 Muscle weakness (generalized): Secondary | ICD-10-CM | POA: Diagnosis not present

## 2023-06-20 DIAGNOSIS — M25611 Stiffness of right shoulder, not elsewhere classified: Secondary | ICD-10-CM | POA: Diagnosis not present

## 2023-06-20 DIAGNOSIS — S46011D Strain of muscle(s) and tendon(s) of the rotator cuff of right shoulder, subsequent encounter: Secondary | ICD-10-CM | POA: Diagnosis not present

## 2023-06-21 ENCOUNTER — Encounter (HOSPITAL_BASED_OUTPATIENT_CLINIC_OR_DEPARTMENT_OTHER): Payer: Self-pay | Admitting: Family

## 2023-06-21 ENCOUNTER — Ambulatory Visit (HOSPITAL_BASED_OUTPATIENT_CLINIC_OR_DEPARTMENT_OTHER): Payer: Medicare PPO | Admitting: Family

## 2023-06-21 VITALS — BP 150/78 | HR 64 | Ht 64.5 in | Wt 233.6 lb

## 2023-06-21 DIAGNOSIS — I5032 Chronic diastolic (congestive) heart failure: Secondary | ICD-10-CM | POA: Diagnosis not present

## 2023-06-21 DIAGNOSIS — I4729 Other ventricular tachycardia: Secondary | ICD-10-CM | POA: Diagnosis not present

## 2023-06-21 DIAGNOSIS — I10 Essential (primary) hypertension: Secondary | ICD-10-CM | POA: Diagnosis not present

## 2023-06-21 DIAGNOSIS — G4733 Obstructive sleep apnea (adult) (pediatric): Secondary | ICD-10-CM | POA: Diagnosis not present

## 2023-06-21 MED ORDER — CHLORTHALIDONE 25 MG PO TABS: 12.5000 mg | ORAL_TABLET | Freq: Every day | ORAL | 3 refills | Status: DC

## 2023-06-21 NOTE — Progress Notes (Signed)
Cardiology Office Note:  .   Date:  06/21/2023  ID:  Jocelyn Kaufman, DOB 02-Jul-1956, MRN [id number] PCP: Merri Brunette, MD  Nephrology: Dr. Vallery Sa [facility] Providers Cardiologist:  Chrystie Nose, MD    History of Present Illness: .   Jocelyn Kaufman is a 67 y.o. 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   Seen 04/2021 doing overall well with excpetion of one episode of chest discomfort that was atypical for angina as occurred at rest and self resolved. In occurred after a fall in her yard and was thought to be musculoskeletal in natrua. No ischemic evaluation recommended.    At last visit 05/07/22 noted exertional dyspnea with subsequent echocardiogram 04/2022 normal LVEF 55-60%, moderate to severe mitral calcification with mild MR. Myoview 07/31/22 low risk study with no ischemia.    Admitted 08/20/22 with PE after quadriceps tendon repair 2 weeks prior. Treated with OAC for 3 months. Echo during admission 07/2022 LVEF 45-50%, LV global hypokinesis, mild MR, aortic sclerosis without stenosis.   Updated echo 02/2023 LVEF 50-55%, moderate LVH, aortic valve sclerosis without stenosis.   Last seen 04/22/23 taking Chlorthalidone intermittently for LE edema with intermittent dyspnea which was attributed with stressors.   11/2022 total cholesterol 171, HDL 68, LDL88, triglycerides 78  Presents today for follow up independently. Weight up 5 pounds from last office visit. Notes shortness of breath over the last 2 weeks that occurs at random. Some nonproductive cough which she has attributed to allergies. No wheezing. Notes some right sided chest discomfort over at the last week similar to previous that occurs at rest or with activity, even laying int he bed. It  independently resolves after a few seconds or a minute. Did not worsen with deep breathing. Took Chlorthalidone two days this week - Sunday, Monday. On average taking 2-3 times per week. Did not tolerate taking daily due ot urinary frequency. BP at home usually 130s/60s. Brief episode of palpitations earlier this week which self resolved.  ROS: Please see the history of present illness.    All other systems reviewed and are negative.   Studies Reviewed: .        Cardiac Studies & Procedures     STRESS TESTS  MYOCARDIAL PERFUSION IMAGING 07/31/2022  Narrative   The study is normal. The study is low risk.   No ST deviation was noted.   LV perfusion is normal. There is no evidence of ischemia. There is no evidence of infarction.   Left ventricular function is normal. Nuclear stress EF: 55 %. The left ventricular ejection fraction is normal (55-65%). End diastolic cavity size is normal. End systolic cavity size is normal.   Prior study available for comparison from 10/16/2018.  Breast attenuation no ischemia normal EF 55%   ECHOCARDIOGRAM  ECHOCARDIOGRAM COMPLETE 03/21/2023  Narrative ECHOCARDIOGRAM REPORT    Patient Name:   Jocelyn Kaufman Date of Exam: 03/21/2023 Medical Rec #:  [id number]    Height:       64.5 in Accession #:    [id number]   Weight:       23 0.0 lb Date of Birth:  May 10, 1956    BSA:          2.087 m Patient Age:    67 years     BP:  125/60 mmHg Patient Gender: F            HR:           66 bpm. Exam Location:  Outpatient  Procedure: 2D Echo, 3D Echo, Cardiac Doppler, Color Doppler and Strain Analysis  Indications:    Dyspnea  History:        Patient has prior history of Echocardiogram examinations, most recent 05/15/2022. TIA; Risk Factors:Hypertension and Non-Smoker. Pericardial Effusion, Paroxysmal Ventricular Tachycardia, Pulmonary Embolism.  Sonographer:    Jeryl Columbia RDCS Referring Phys: [id number] Myrakle Wingler S Myishia Kasik  IMPRESSIONS   1. Left  ventricular ejection fraction, by estimation, is 50 to 55%. The left ventricle has low normal function. The left ventricle demonstrates global hypokinesis. The left ventricular internal cavity size was mildly dilated. Left ventricular diastolic parameters are consistent with Grade III diastolic dysfunction (restrictive). 2. Right ventricular systolic function is normal. The right ventricular size is normal. There is normal pulmonary artery systolic pressure. 3. Left atrial size was mild to moderately dilated. 4. Right atrial size was mildly dilated. 5. The mitral valve is grossly normal. Trivial mitral valve regurgitation. No evidence of mitral stenosis. Moderate to severe mitral annular calcification. 6. The aortic valve was not well visualized. There is mild calcification of the aortic valve. There is mild thickening of the aortic valve. Aortic valve regurgitation is not visualized. Aortic valve sclerosis is present, with no evidence of aortic valve stenosis. 7. The inferior vena cava is normal in size with greater than 50% respiratory variability, suggesting right atrial pressure of 3 mmHg.  Comparison(s): Prior images reviewed side by side. Changes from prior study are noted. EF slightly improved compared to prior study.  FINDINGS Left Ventricle: Left ventricular ejection fraction, by estimation, is 50 to 55%. The left ventricle has low normal function. The left ventricle demonstrates global hypokinesis. The left ventricular internal cavity size was mildly dilated. There is no left ventricular hypertrophy. Left ventricular diastolic parameters are consistent with Grade III diastolic dysfunction (restrictive).  Right Ventricle: The right ventricular size is normal. Right vetricular wall thickness was not well visualized. Right ventricular systolic function is normal. There is normal pulmonary artery systolic pressure. The tricuspid regurgitant velocity is 2.13 m/s, and with an assumed right  atrial pressure of 3 mmHg, the estimated right ventricular systolic pressure is 21.1 mmHg.  Left Atrium: Left atrial size was mild to moderately dilated.  Right Atrium: Right atrial size was mildly dilated.  Pericardium: There is no evidence of pericardial effusion.  Mitral Valve: The mitral valve is grossly normal. Moderate to severe mitral annular calcification. Trivial mitral valve regurgitation. No evidence of mitral valve stenosis. MV peak gradient, 8.0 mmHg. The mean mitral valve gradient is 2.0 mmHg.  Tricuspid Valve: The tricuspid valve is grossly normal. Tricuspid valve regurgitation is trivial. No evidence of tricuspid stenosis.  Aortic Valve: The aortic valve was not well visualized. There is mild calcification of the aortic valve. There is mild thickening of the aortic valve. Aortic valve regurgitation is not visualized. Aortic valve sclerosis is present, with no evidence of aortic valve stenosis.  Pulmonic Valve: The pulmonic valve was not well visualized. Pulmonic valve regurgitation is trivial.  Aorta: The aortic root, ascending aorta, aortic arch and descending aorta are all structurally normal, with no evidence of dilitation or obstruction.  Venous: The inferior vena cava is normal in size with greater than 50% respiratory variability, suggesting right atrial pressure of 3 mmHg.  IAS/Shunts: The interatrial septum was not well  visualized.   LEFT VENTRICLE PLAX 2D LVIDd:         5.09 cm   Diastology LVIDs:         3.76 cm   LV e' medial:    3.71 cm/s LV PW:         1.24 cm   LV E/e' medial:  38.0 LV IVS:        0.77 cm   LV e' lateral:   3.30 cm/s LVOT diam:     2.10 cm   LV E/e' lateral: 42.7 LV SV:         66 LV SV Index:   32 LVOT Area:     3.46 cm  3D Volume EF: 3D EF:        55 % LV EDV:       125 ml LV ESV:       57 ml LV SV:        69 ml  RIGHT VENTRICLE RV Basal diam:  4.38 cm RV Mid diam:    3.21 cm RV S prime:     8.86 cm/s TAPSE (M-mode): 2.7  cm  LEFT ATRIUM             Index        RIGHT ATRIUM           Index LA diam:        4.70 cm 2.25 cm/m   RA Area:     19.10 cm LA Vol (A2C):   75.2 ml 36.03 ml/m  RA Volume:   54.80 ml  26.26 ml/m LA Vol (A4C):   65.8 ml 31.53 ml/m LA Biplane Vol: 71.7 ml 34.35 ml/m AORTIC VALVE LVOT Vmax:   90.60 cm/s LVOT Vmean:  59.400 cm/s LVOT VTI:    0.191 m  AORTA Ao Root diam: 2.90 cm Ao Asc diam:  3.50 cm  MITRAL VALVE                TRICUSPID VALVE MV Area (PHT): 4.60 cm     TR Peak grad:   18.1 mmHg MV Area VTI:   1.86 cm     TR Vmax:        213.00 cm/s MV Peak grad:  8.0 mmHg MV Mean grad:  2.0 mmHg     SHUNTS MV Vmax:       1.41 m/s     Systemic VTI:  0.19 m MV Vmean:      51.7 cm/s    Systemic Diam: 2.10 cm MV Decel Time: 165 msec MR Peak grad: 43.8 mmHg MR Vmax:      331.00 cm/s MV E velocity: 141.00 cm/s MV A velocity: 42.00 cm/s MV E/A ratio:  3.36  Jodelle Red MD Electronically signed by Jodelle Red MD Signature Date/Time: 03/25/2023/10:13:56 PM    Final    MONITORS  CARDIAC EVENT MONITOR 08/06/2018           Risk Assessment/Calculations:     HYPERTENSION CONTROL Vitals:   06/21/23 1036 06/21/23 1118  BP: (!) 155/75 (!) 150/78    The patient's blood pressure is elevated above target today.  In order to address the patient's elevated BP: A current anti-hypertensive medication was adjusted today.; Follow up with general cardiology has been recommended.          Physical Exam:   VS:  BP (!) 150/78   Pulse 64   Ht 5' 4.5" (1.638 m)   Wt 233 lb 9.6 oz (106 kg)  SpO2 99%   BMI 39.48 kg/m    Wt Readings from Last 3 Encounters:  06/21/23 233 lb 9.6 oz (106 kg)  06/04/23 228 lb 12.8 oz (103.8 kg)  05/13/23 228 lb 9.6 oz (103.7 kg)    GEN: Well nourished, well developed in no acute distress NECK: No JVD; No carotid bruits CARDIAC: RRR, no murmurs, rubs, gallops RESPIRATORY:  Clear to auscultation without rales, wheezing or  rhonchi  ABDOMEN: Soft, non-tender, non-distended EXTREMITIES:  No edema; No deformity   ASSESSMENT AND PLAN: .    Diastolic heart failure - Echo 14/7829 LVEF 45-50% in setting of PE. Echo 02/2023 LVEF 50-55%, grIII diastolic dysfunction.  Low sodium diet, fluid restriction <2L, and daily weights encouraged. Educated to contact our office for weight gain of 2 lbs overnight or 5 lbs in one week.  Weight up today 5 pounds most clinic visit and exertional dyspnea over the last 2 weeks consistent with volume overload.  Previously did not tolerate Jardiance due to yeast infection.  Given multiple comorbidities does have polypharmacy.  Continue metoprolol, irbesartan.  Increase chlorthalidone from as needed to daily.  Check in via MyChart in 2 weeks.  Future consideration include transition from irbesartan to [facility] if dyspnea persistent.   NSVT / PVC - Well controlled on Toprol 25mg  QD.  She may take additional half tablet as needed for breakthrough palpitations.    DM2 - Continue to follow with PCP.   Coronary artery calcification on CT - Inadvertent finding. Myoview 07/2022 low risk. GDMT aspirin, atorvatatin, metoprolol.    HTN - BP not at goal <130/80. Did not tolerate HCTZ daily due to urinary frequency.  Presently taking chlorthalidone as needed for lower extremity edema.  Given persistent exertional dyspnea BP not at goal, will increase chlorthalidone to 12.5 mg daily.  Has upcoming labs with hematology team. Will check in via MyChart in a couple weeks.   OSA - CPAP compliance encouraged. She is wearing regularly.    Anemia of chronic disease -follows with hematology.   06/04/23 Hb 10.7.    Obesity - Weight loss via diet and exercise encouraged. Discussed the impact being overweight would have on cardiovascular risk. Completed PREP exercise program.          Dispo: follow up 10/2023 or 11/2023  Signed, Alver Sorrow, NP

## 2023-06-21 NOTE — Patient Instructions (Addendum)
Medication Instructions:  Your physician has recommended you make the following change in your medication:   CHANGE Chlorthalidone to 12.5mg  (half tablet) once per day  If you want to transition to Amlodipine-Atorvastatin combination tablet, simply let us know when you are close to only 2 weeks left of your medications.   *If you need a refill on your cardiac medications before your next appointment, please call your pharmacy*  Follow-Up: At [facility], you and your health needs are our priority.  As part of our continuing mission to provide you with exceptional heart care, we have created designated Provider Care Teams.  These Care Teams include your primary Cardiologist (physician) and Advanced Practice Providers (APPs -  Physician Assistants and Nurse Practitioners) who all work together to provide you with the care you need, when you need it.  We recommend signing up for the patient portal called "MyChart".  Sign up information is provided on this After Visit Summary.  MyChart is used to connect with patients for Virtual Visits (Telemedicine).  Patients are able to view lab/test results, encounter notes, upcoming appointments, etc.  Non-urgent messages can be sent to your provider as well.   To learn more about what you can do with MyChart, go to [email or web address].    Your next appointment:   January or February 2025  Provider:   K. Italy Hilty, MD or Gillian Shields, NP

## 2023-06-25 ENCOUNTER — Inpatient Hospital Stay: Payer: Medicare PPO

## 2023-06-25 ENCOUNTER — Other Ambulatory Visit: Payer: Self-pay

## 2023-06-25 DIAGNOSIS — D631 Anemia in chronic kidney disease: Secondary | ICD-10-CM | POA: Diagnosis not present

## 2023-06-25 DIAGNOSIS — E1122 Type 2 diabetes mellitus with diabetic chronic kidney disease: Secondary | ICD-10-CM | POA: Diagnosis not present

## 2023-06-25 DIAGNOSIS — N183 Chronic kidney disease, stage 3 unspecified: Secondary | ICD-10-CM | POA: Diagnosis not present

## 2023-06-25 LAB — CBC WITH DIFFERENTIAL (CANCER CENTER ONLY)
Abs Immature Granulocytes: 0.02 K/uL (ref 0.00–0.07)
Basophils Absolute: 0.1 K/uL (ref 0.0–0.1)
Basophils Relative: 1 %
Eosinophils Absolute: 0.1 K/uL (ref 0.0–0.5)
Eosinophils Relative: 1 %
HCT: 35.4 % — ABNORMAL LOW (ref 36.0–46.0)
Hemoglobin: 11.2 g/dL — ABNORMAL LOW (ref 12.0–15.0)
Immature Granulocytes: 0 %
Lymphocytes Relative: 23 %
Lymphs Abs: 1.6 K/uL (ref 0.7–4.0)
MCH: 29.1 pg (ref 26.0–34.0)
MCHC: 31.6 g/dL (ref 30.0–36.0)
MCV: 91.9 fL (ref 80.0–100.0)
Monocytes Absolute: 0.4 K/uL (ref 0.1–1.0)
Monocytes Relative: 7 %
Neutro Abs: 4.5 K/uL (ref 1.7–7.7)
Neutrophils Relative %: 68 %
Platelet Count: 194 K/uL (ref 150–400)
RBC: 3.85 MIL/uL — ABNORMAL LOW (ref 3.87–5.11)
RDW: 14.6 % (ref 11.5–15.5)
WBC Count: 6.7 K/uL (ref 4.0–10.5)
nRBC: 0 % (ref 0.0–0.2)

## 2023-06-25 NOTE — Progress Notes (Signed)
Pt here for retacrit injection.  Hgb 11.2.  Parameters met and no need for injection.

## 2023-07-03 DIAGNOSIS — E113491 Type 2 diabetes mellitus with severe nonproliferative diabetic retinopathy without macular edema, right eye: Secondary | ICD-10-CM | POA: Diagnosis not present

## 2023-07-05 DIAGNOSIS — E1122 Type 2 diabetes mellitus with diabetic chronic kidney disease: Secondary | ICD-10-CM | POA: Diagnosis not present

## 2023-07-05 DIAGNOSIS — E039 Hypothyroidism, unspecified: Secondary | ICD-10-CM | POA: Diagnosis not present

## 2023-07-05 DIAGNOSIS — M0579 Rheumatoid arthritis with rheumatoid factor of multiple sites without organ or systems involvement: Secondary | ICD-10-CM | POA: Diagnosis not present

## 2023-07-10 DIAGNOSIS — E113512 Type 2 diabetes mellitus with proliferative diabetic retinopathy with macular edema, left eye: Secondary | ICD-10-CM | POA: Diagnosis not present

## 2023-07-12 DIAGNOSIS — E1122 Type 2 diabetes mellitus with diabetic chronic kidney disease: Secondary | ICD-10-CM | POA: Diagnosis not present

## 2023-07-12 DIAGNOSIS — E1121 Type 2 diabetes mellitus with diabetic nephropathy: Secondary | ICD-10-CM | POA: Diagnosis not present

## 2023-07-12 DIAGNOSIS — E039 Hypothyroidism, unspecified: Secondary | ICD-10-CM | POA: Diagnosis not present

## 2023-07-12 DIAGNOSIS — N1831 Chronic kidney disease, stage 3a: Secondary | ICD-10-CM | POA: Diagnosis not present

## 2023-07-16 DIAGNOSIS — Z23 Encounter for immunization: Secondary | ICD-10-CM

## 2023-07-16 NOTE — Progress Notes (Signed)
Pt received flu vaccine

## 2023-07-17 ENCOUNTER — Inpatient Hospital Stay: Payer: Medicare PPO | Admitting: Hematology and Oncology

## 2023-07-17 ENCOUNTER — Inpatient Hospital Stay: Payer: Medicare PPO | Attending: Hematology and Oncology

## 2023-07-17 ENCOUNTER — Inpatient Hospital Stay: Payer: Medicare PPO

## 2023-07-17 VITALS — BP 139/59 | HR 68 | Temp 97.2°F | Resp 18 | Ht 64.5 in | Wt 229.8 lb

## 2023-07-17 DIAGNOSIS — N183 Chronic kidney disease, stage 3 unspecified: Secondary | ICD-10-CM | POA: Insufficient documentation

## 2023-07-17 DIAGNOSIS — D638 Anemia in other chronic diseases classified elsewhere: Secondary | ICD-10-CM

## 2023-07-17 DIAGNOSIS — D649 Anemia, unspecified: Secondary | ICD-10-CM

## 2023-07-17 DIAGNOSIS — E1122 Type 2 diabetes mellitus with diabetic chronic kidney disease: Secondary | ICD-10-CM | POA: Insufficient documentation

## 2023-07-17 DIAGNOSIS — D631 Anemia in chronic kidney disease: Secondary | ICD-10-CM | POA: Insufficient documentation

## 2023-07-17 LAB — IRON AND IRON BINDING CAPACITY (CC-WL,HP ONLY)
Iron: 113 ug/dL (ref 28–170)
Saturation Ratios: 46 % — ABNORMAL HIGH (ref 10.4–31.8)
TIBC: 246 ug/dL — ABNORMAL LOW (ref 250–450)
UIBC: 133 ug/dL — ABNORMAL LOW (ref 148–442)

## 2023-07-17 LAB — CBC WITH DIFFERENTIAL (CANCER CENTER ONLY)
Abs Immature Granulocytes: 0.03 10*3/uL (ref 0.00–0.07)
Basophils Absolute: 0.1 10*3/uL (ref 0.0–0.1)
Basophils Relative: 1 %
Eosinophils Absolute: 0.1 10*3/uL (ref 0.0–0.5)
Eosinophils Relative: 2 %
HCT: 32.9 % — ABNORMAL LOW (ref 36.0–46.0)
Hemoglobin: 10.2 g/dL — ABNORMAL LOW (ref 12.0–15.0)
Immature Granulocytes: 0 %
Lymphocytes Relative: 22 %
Lymphs Abs: 1.6 10*3/uL (ref 0.7–4.0)
MCH: 28.5 pg (ref 26.0–34.0)
MCHC: 31 g/dL (ref 30.0–36.0)
MCV: 91.9 fL (ref 80.0–100.0)
Monocytes Absolute: 0.5 10*3/uL (ref 0.1–1.0)
Monocytes Relative: 7 %
Neutro Abs: 5 10*3/uL (ref 1.7–7.7)
Neutrophils Relative %: 68 %
Platelet Count: 160 10*3/uL (ref 150–400)
RBC: 3.58 MIL/uL — ABNORMAL LOW (ref 3.87–5.11)
RDW: 14.2 % (ref 11.5–15.5)
WBC Count: 7.3 10*3/uL (ref 4.0–10.5)
nRBC: 0 % (ref 0.0–0.2)

## 2023-07-17 LAB — CMP (CANCER CENTER ONLY)
ALT: 15 U/L (ref 0–44)
AST: 17 U/L (ref 15–41)
Albumin: 3.4 g/dL — ABNORMAL LOW (ref 3.5–5.0)
Alkaline Phosphatase: 65 U/L (ref 38–126)
Anion gap: 2 — ABNORMAL LOW (ref 5–15)
BUN: 30 mg/dL — ABNORMAL HIGH (ref 8–23)
CO2: 28 mmol/L (ref 22–32)
Calcium: 8.8 mg/dL — ABNORMAL LOW (ref 8.9–10.3)
Chloride: 110 mmol/L (ref 98–111)
Creatinine: 1.14 mg/dL — ABNORMAL HIGH (ref 0.44–1.00)
GFR, Estimated: 53 mL/min — ABNORMAL LOW (ref >60)
Glucose, Bld: 101 mg/dL — ABNORMAL HIGH (ref 70–99)
Potassium: 4.3 mmol/L (ref 3.5–5.1)
Sodium: 140 mmol/L (ref 135–145)
Total Bilirubin: 0.3 mg/dL (ref 0.3–1.2)
Total Protein: 6 g/dL — ABNORMAL LOW (ref 6.5–8.1)

## 2023-07-17 LAB — VITAMIN B12: Vitamin B-12: 947 pg/mL — ABNORMAL HIGH (ref 180–914)

## 2023-07-17 LAB — FERRITIN: Ferritin: 154 ng/mL (ref 11–307)

## 2023-07-17 MED ORDER — EPOETIN ALFA-EPBX 40000 UNIT/ML IJ SOLN
40000.0000 [IU] | Freq: Once | INTRAMUSCULAR | Status: AC
  Administered 2023-07-17: 40000 [IU] via SUBCUTANEOUS
  Filled 2023-07-17: qty 1

## 2023-07-17 NOTE — Progress Notes (Signed)
Patient Care Team: Merri Brunette, MD as PCP - General (Internal Medicine) Rennis Golden Lisette Abu, MD as PCP - Cardiology (Cardiology) Serena Croissant, MD as Consulting Physician (Hematology and Oncology) Casimer Lanius, MD as Consulting Physician (Rheumatology) Marlene Lard, MD as Consulting Physician (Endocrinology) Janet Berlin, MD as Consulting Physician (Ophthalmology) Estanislado Emms, MD as Consulting Physician (Nephrology)  DIAGNOSIS:  Encounter Diagnosis  Name Primary?   Anemia in stage 3 chronic kidney disease, unspecified whether stage 3a or 3b CKD (HCC) Yes      CHIEF COMPLIANT:   Discussed the use of AI scribe software for clinical note transcription with the patient, who gave verbal consent to proceed.  History of Present Illness   The patient, with a history of anemia and kidney disease, presents for a routine follow-up. She reports periodic fatigue, but struggles to identify a specific trigger due to multiple ongoing health concerns. She received her last injection for anemia about six weeks ago. The patient notes a recent increase in her kidney disease from stage two to stage three, which is a concern for her given a family history of kidney disease. She is due to see her nephrologist next month. She also reports occasional days of feeling 'out of it,' which she attributes to inadequate hydration.         ALLERGIES:  is allergic to semaglutide(0.25 or 0.5mg -dos), shellfish-derived products, empagliflozin, semaglutide, tramadol hcl, canagliflozin, latex, liraglutide, minoxidil, omeprazole, and percocet [oxycodone-acetaminophen].  MEDICATIONS:  Current Outpatient Medications  Medication Sig Dispense Refill   ACCU-CHEK GUIDE test strip      Accu-Chek Softclix Lancets lancets use to check blood sugar     Aflibercept (EYLEA) 2 MG/0.05ML SOLN 2 mg by Intravitreal route every 3 (three) months.     albuterol (VENTOLIN HFA) 108 (90 Base) MCG/ACT inhaler Inhale two puffs  every four to six hours as needed for cough or wheeze. (Patient taking differently: Inhale 1-2 puffs into the lungs every 6 (six) hours as needed for wheezing.) 18 g 2   amLODipine (NORVASC) 10 MG tablet Take 1 tablet (10 mg total) by mouth daily.     aspirin EC 81 MG tablet Take 81 mg by mouth daily. Swallow whole.     atorvastatin (LIPITOR) 40 MG tablet Take 1 tablet (40 mg total) by mouth daily at 6 PM. (Patient taking differently: Take 40 mg by mouth daily.) 30 tablet 0   B-D ULTRAFINE III SHORT PEN 31G X 8 MM MISC SMARTSIG:1 Each SUB-Q Daily     CHELATED IRON PO Take 18 mg by mouth daily.     chlorthalidone (HYGROTON) 25 MG tablet Take 0.5 tablets (12.5 mg total) by mouth daily. 45 tablet 3   Cholecalciferol (VITAMIN D3) 125 MCG (5000 UT) CAPS Take 5,000 Units by mouth daily.     clindamycin (CLEOCIN T) 1 % external solution Apply to underarm daily to twice as needed.     CLOBETASOL PROPIONATE E 0.05 % emollient cream Apply topically 3 (three) times daily.     Dulaglutide (TRULICITY) 1.5 MG/0.5ML SOPN Inject 1.5 mg into the skin once a week. 2 mL 5   EPINEPHrine (EPIPEN 2-PAK) 0.3 mg/0.3 mL IJ SOAJ injection Use as directed for life-threatening allergic reaction. 2 each 1   epoetin alfa-epbx (RETACRIT) 2000 UNIT/ML injection See admin instructions.     escitalopram (LEXAPRO) 20 MG tablet Take 20 mg by mouth daily.     ezetimibe (ZETIA) 10 MG tablet Take 1 tablet (10 mg total) by mouth daily.  fexofenadine (ALLEGRA ALLERGY) 180 MG tablet Take 1 tablet (180 mg total) by mouth daily.     folic acid (FOLVITE) 1 MG tablet Take 2 mg by mouth daily.     hydrALAZINE (APRESOLINE) 50 MG tablet Take 50 mg by mouth 3 (three) times daily.     hydrocortisone cream 1 % Apply 1 Application topically 2 (two) times daily as needed for itching.     inFLIXimab (REMICADE IV) Inject into the vein.     inFLIXimab (REMICADE) 100 MG injection Inject into the vein.     insulin aspart (NOVOLOG) 100 UNIT/ML FlexPen  Inject into the skin See admin instructions. Sliding Scale     insulin glargine, 2 Unit Dial, (TOUJEO MAX SOLOSTAR) 300 UNIT/ML Solostar Pen Inject 42 Units into the skin at bedtime.     irbesartan (AVAPRO) 300 MG tablet Take 1 tablet (300 mg total) by mouth daily.     KERENDIA 10 MG TABS 10 mg daily.     Lancets Misc. (ACCU-CHEK SOFTCLIX LANCET DEV) KIT See admin instructions.     latanoprost (XALATAN) 0.005 % ophthalmic solution Place 1 drop into both eyes at bedtime.     lidocaine (LMX) 4 % cream Apply 1 Application topically as needed (pain).     Methotrexate Sodium (METHOTREXATE, PF,) 50 MG/2ML injection Inject 20 mg into the muscle every Wednesday. 0.6 ml     metoprolol succinate (TOPROL-XL) 25 MG 24 hr tablet Take 1 tablet (25 mg total) by mouth daily. May take additional half tablet as needed once daily for palpitations. (Patient taking differently: Take 12.5-25 mg by mouth See admin instructions. Take 25 mg by mouth once a day and an additional 12.5 mg (half tablet) as needed once daily for palpitations) 135 tablet 3   Multiple Vitamins-Minerals (CENTRUM SILVER PO) Take 1 tablet by mouth daily with breakfast.     sulfaSALAzine (AZULFIDINE) 500 MG tablet Take 3 tablets (1,500 mg total) by mouth 2 (two) times daily.     SYNTHROID 125 MCG tablet Take 125 mcg by mouth every Monday, Tuesday, Wednesday, Thursday, and Friday.     triamcinolone cream (KENALOG) 0.1 % Apply 1 Application topically 2 (two) times daily as needed (for itching- affected areas).     vitamin B-12 (CYANOCOBALAMIN) 1000 MCG tablet Take 1,000 mcg by mouth daily.     No current facility-administered medications for this visit.    PHYSICAL EXAMINATION: ECOG PERFORMANCE STATUS: 1 - Symptomatic but completely ambulatory  Vitals:   07/17/23 1043  BP: (!) 139/59  Pulse: 68  Resp: 18  Temp: (!) 97.2 F (36.2 C)  SpO2: 100%   Filed Weights   07/17/23 1043  Weight: 229 lb 12.8 oz (104.2 kg)      LABORATORY DATA:  I  have reviewed the data as listed    Latest Ref Rng & Units 05/14/2023   12:57 PM 04/23/2023    3:40 PM 10/04/2022    7:15 AM  CMP  Glucose 70 - 99 mg/dL 85  161  096   BUN 8 - 23 mg/dL 33  35  35   Creatinine 0.44 - 1.00 mg/dL 0.45  4.09  8.11   Sodium 135 - 145 mmol/L 139  140  135   Potassium 3.5 - 5.1 mmol/L 5.0  4.6  4.8   Chloride 98 - 111 mmol/L 109  108  107   CO2 22 - 32 mmol/L 24  28  24    Calcium 8.9 - 10.3 mg/dL 8.9  9.1  8.4   Total Protein 6.5 - 8.1 g/dL 6.0  6.4    Total Bilirubin 0.3 - 1.2 mg/dL 0.4  0.4    Alkaline Phos 38 - 126 U/L 57  68    AST 15 - 41 U/L 29  22    ALT 0 - 44 U/L 29  19      Lab Results  Component Value Date   WBC 7.3 07/17/2023   HGB 10.2 (L) 07/17/2023   HCT 32.9 (L) 07/17/2023   MCV 91.9 07/17/2023   PLT 160 07/17/2023   NEUTROABS 5.0 07/17/2023    ASSESSMENT & PLAN:  Anemia in chronic kidney disease Patient has history of hypothyroidism as well as possible adrenal dysfunction issues for which [facility] to [facility]   Previous blood work review: 1.  Erythropoietin: 24.2 2. SPEP: No M protein 3.  Haptoglobin: 146, LDH 281, absolute reticulocyte count 96.4, direct Coombs test negative 4.  B12 750 5.  Folic acid 23.5 6.  Ferritin 88, iron saturation 25 7. 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  (Retacrit injections for hemoglobin 11 or less) Lab review 03/23/2020: Erythropoietin 27.9, hemoglobin 9.6 02/02/2021: Hemoglobin 10.6 (given her severe symptoms continue with Aranesp) 06/22/2021: Hemoglobin 11.3  11/07/2021: Hemoglobin 11.1 11/21/2021: Hemoglobin 10.5 (no Retacrit today) 01/02/2022: Hemoglobin 10.7 (no Retacrit today): Last Retacrit was on 12/05/2021   02/01/2022: Hemoglobin 10.3: Proceeding to Retacrit today 05/24/2022: Hemoglobin 10.4: Retacrit being given today 08/31/2022: Hemoglobin 10.1 09/19/2022: Hemoglobin 11.2 (Retacrit given 08/31/2022) 12/18/2022: Hemoglobin 10.6 (recommended that she receive Retacrit today) 01/29/2023: Hemoglobin 11.2 (held Retacrit) 06/04/2019: Hemoglobin 10.7 06/25/2023: Hemoglobin 11.2 (held Retacrit) 07/17/2023: Hemoglobin 10.2   Previous Retacrit dose: 04/23/2023, 06/04/2023   Knee replacement surgery November 2023 spent 7 weeks in rehab.   Recheck labs every 4 weeks for lab and Retacrit.  MD visit every 8 weeks ------------------------------------- Assessment and Plan    Anemia Hemoglobin levels fluctuating, currently at 10.2. Responds to injections but does not sustain for six weeks. -Administer injection today. -Change injection schedule to every four weeks to prevent hemoglobin levels from dropping too low.  Chronic Kidney Disease Patient reports fluctuating kidney function, currently at stage 3. Family history of kidney disease. -Await results of today's kidney function tests. -Encourage patient to maintain hydration. -Follow-up with nephrologist next month.  Appointment Schedule Current appointment scheduled for 10/8. -Reschedule appointment to 10/15 to align with new injection schedule. -Patient to see doctor every eight weeks instead of every six.          No orders of the defined types were placed in this encounter.  The patient has a good understanding of the overall plan. she agrees with it. she will call with any problems that may develop before the next visit here. Total time spent: 30 mins including face to face time and time spent for planning, charting and co-ordination of care   Tamsen Meek, MD 07/17/23

## 2023-07-17 NOTE — Assessment & Plan Note (Signed)
Patient has history of hypothyroidism as well as possible adrenal dysfunction issues for which she goes to [facility]   Previous blood work review: 1.  Erythropoietin: 24.2 2. SPEP: No M protein 3.  Haptoglobin: 146, LDH 281, absolute reticulocyte count 96.4, direct Coombs test negative 4.  B12 750 5.  Folic acid 23.5 6.  Ferritin 88, iron saturation 25 7. 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  (Retacrit injections for hemoglobin 11 or less) Lab review 03/23/2020: Erythropoietin 27.9, hemoglobin 9.6 02/02/2021: Hemoglobin 10.6 (given her severe symptoms continue with Aranesp) 06/22/2021: Hemoglobin 11.3  11/07/2021: Hemoglobin 11.1 11/21/2021: Hemoglobin 10.5 (no Retacrit today) 01/02/2022: Hemoglobin 10.7 (no Retacrit today): Last Retacrit was on 12/05/2021  02/01/2022: Hemoglobin 10.3: Proceeding to Retacrit today 05/24/2022: Hemoglobin 10.4: Retacrit being given today 08/31/2022: Hemoglobin 10.1 09/19/2022: Hemoglobin 11.2 (Retacrit given 08/31/2022) 12/18/2022: Hemoglobin 10.6 (recommended that she receive Retacrit today) 01/29/2023: Hemoglobin 11.2 (held Retacrit) 06/04/2019: Hemoglobin 10.7 06/25/2023: Hemoglobin 11.2 (held Retacrit) 07/17/2023:   Previous Retacrit dose: 04/23/2023, 06/04/2023   Knee replacement surgery November 2023 spent 7 weeks in rehab.   Recheck labs every 3 weeks for lab and Retacrit.  MD visit every 6 weeks

## 2023-07-18 DIAGNOSIS — Z79899 Other long term (current) drug therapy: Secondary | ICD-10-CM | POA: Diagnosis not present

## 2023-07-18 DIAGNOSIS — D649 Anemia, unspecified: Secondary | ICD-10-CM | POA: Diagnosis not present

## 2023-07-18 DIAGNOSIS — M79643 Pain in unspecified hand: Secondary | ICD-10-CM | POA: Diagnosis not present

## 2023-07-18 DIAGNOSIS — M1711 Unilateral primary osteoarthritis, right knee: Secondary | ICD-10-CM | POA: Diagnosis not present

## 2023-07-18 DIAGNOSIS — E1169 Type 2 diabetes mellitus with other specified complication: Secondary | ICD-10-CM | POA: Diagnosis not present

## 2023-07-18 DIAGNOSIS — M25569 Pain in unspecified knee: Secondary | ICD-10-CM | POA: Diagnosis not present

## 2023-07-18 DIAGNOSIS — M797 Fibromyalgia: Secondary | ICD-10-CM | POA: Diagnosis not present

## 2023-07-18 DIAGNOSIS — M0579 Rheumatoid arthritis with rheumatoid factor of multiple sites without organ or systems involvement: Secondary | ICD-10-CM | POA: Diagnosis not present

## 2023-07-18 DIAGNOSIS — M1990 Unspecified osteoarthritis, unspecified site: Secondary | ICD-10-CM | POA: Diagnosis not present

## 2023-07-23 DIAGNOSIS — M0579 Rheumatoid arthritis with rheumatoid factor of multiple sites without organ or systems involvement: Secondary | ICD-10-CM | POA: Diagnosis not present

## 2023-08-02 ENCOUNTER — Other Ambulatory Visit (HOSPITAL_COMMUNITY): Payer: Self-pay

## 2023-08-06 ENCOUNTER — Other Ambulatory Visit: Payer: Medicare PPO

## 2023-08-06 ENCOUNTER — Ambulatory Visit: Payer: Medicare PPO

## 2023-08-13 ENCOUNTER — Inpatient Hospital Stay: Payer: Medicare PPO

## 2023-08-13 ENCOUNTER — Inpatient Hospital Stay: Payer: Medicare PPO | Attending: Hematology and Oncology

## 2023-08-13 VITALS — BP 143/58 | HR 68 | Temp 99.1°F | Resp 20

## 2023-08-13 DIAGNOSIS — D638 Anemia in other chronic diseases classified elsewhere: Secondary | ICD-10-CM

## 2023-08-13 DIAGNOSIS — D631 Anemia in chronic kidney disease: Secondary | ICD-10-CM

## 2023-08-13 DIAGNOSIS — E1122 Type 2 diabetes mellitus with diabetic chronic kidney disease: Secondary | ICD-10-CM | POA: Insufficient documentation

## 2023-08-13 DIAGNOSIS — D649 Anemia, unspecified: Secondary | ICD-10-CM

## 2023-08-13 DIAGNOSIS — N183 Chronic kidney disease, stage 3 unspecified: Secondary | ICD-10-CM | POA: Insufficient documentation

## 2023-08-13 LAB — CBC WITH DIFFERENTIAL (CANCER CENTER ONLY)
Abs Immature Granulocytes: 0.03 10*3/uL (ref 0.00–0.07)
Basophils Absolute: 0.1 10*3/uL (ref 0.0–0.1)
Basophils Relative: 1 %
Eosinophils Absolute: 0.1 10*3/uL (ref 0.0–0.5)
Eosinophils Relative: 2 %
HCT: 34.8 % — ABNORMAL LOW (ref 36.0–46.0)
Hemoglobin: 10.8 g/dL — ABNORMAL LOW (ref 12.0–15.0)
Immature Granulocytes: 1 %
Lymphocytes Relative: 26 %
Lymphs Abs: 1.5 10*3/uL (ref 0.7–4.0)
MCH: 28.3 pg (ref 26.0–34.0)
MCHC: 31 g/dL (ref 30.0–36.0)
MCV: 91.3 fL (ref 80.0–100.0)
Monocytes Absolute: 0.6 10*3/uL (ref 0.1–1.0)
Monocytes Relative: 11 %
Neutro Abs: 3.4 10*3/uL (ref 1.7–7.7)
Neutrophils Relative %: 59 %
Platelet Count: 225 10*3/uL (ref 150–400)
RBC: 3.81 MIL/uL — ABNORMAL LOW (ref 3.87–5.11)
RDW: 14.9 % (ref 11.5–15.5)
WBC Count: 5.8 10*3/uL (ref 4.0–10.5)
nRBC: 0 % (ref 0.0–0.2)

## 2023-08-13 LAB — CMP (CANCER CENTER ONLY)
ALT: 17 U/L (ref 0–44)
AST: 22 U/L (ref 15–41)
Albumin: 3.5 g/dL (ref 3.5–5.0)
Alkaline Phosphatase: 76 U/L (ref 38–126)
Anion gap: 4 — ABNORMAL LOW (ref 5–15)
BUN: 32 mg/dL — ABNORMAL HIGH (ref 8–23)
CO2: 27 mmol/L (ref 22–32)
Calcium: 9.1 mg/dL (ref 8.9–10.3)
Chloride: 110 mmol/L (ref 98–111)
Creatinine: 1.47 mg/dL — ABNORMAL HIGH (ref 0.44–1.00)
GFR, Estimated: 39 mL/min — ABNORMAL LOW (ref >60)
Glucose, Bld: 144 mg/dL — ABNORMAL HIGH (ref 70–99)
Potassium: 4.3 mmol/L (ref 3.5–5.1)
Sodium: 141 mmol/L (ref 135–145)
Total Bilirubin: 0.3 mg/dL (ref 0.3–1.2)
Total Protein: 6.2 g/dL — ABNORMAL LOW (ref 6.5–8.1)

## 2023-08-13 LAB — FERRITIN: Ferritin: 178 ng/mL (ref 11–307)

## 2023-08-13 LAB — IRON AND IRON BINDING CAPACITY (CC-WL,HP ONLY)
Iron: 94 ug/dL (ref 28–170)
Saturation Ratios: 37 % — ABNORMAL HIGH (ref 10.4–31.8)
TIBC: 253 ug/dL (ref 250–450)
UIBC: 159 ug/dL (ref 148–442)

## 2023-08-13 LAB — VITAMIN B12: Vitamin B-12: 873 pg/mL (ref 180–914)

## 2023-08-13 MED ORDER — EPOETIN ALFA-EPBX 40000 UNIT/ML IJ SOLN
40000.0000 [IU] | Freq: Once | INTRAMUSCULAR | Status: AC
  Administered 2023-08-13: 40000 [IU] via SUBCUTANEOUS
  Filled 2023-08-13: qty 1

## 2023-08-14 ENCOUNTER — Telehealth: Payer: Self-pay

## 2023-08-14 NOTE — Telephone Encounter (Signed)
-----   Message from Noreene Filbert sent at 08/13/2023  7:48 PM EDT ----- Please let patient know that her iron levels and b12 are good. ----- Message ----- From: Leory Plowman, Lab [facility] [place] Sent: 08/13/2023   2:03 PM EDT To: Loa Socks, NP

## 2023-08-14 NOTE — Telephone Encounter (Signed)
Called ptr. Per Np to let her know iron levels and B12 are good. Pt verbalized and understanding.

## 2023-08-19 DIAGNOSIS — N1831 Chronic kidney disease, stage 3a: Secondary | ICD-10-CM | POA: Diagnosis not present

## 2023-08-27 DIAGNOSIS — R809 Proteinuria, unspecified: Secondary | ICD-10-CM | POA: Diagnosis not present

## 2023-08-27 DIAGNOSIS — I129 Hypertensive chronic kidney disease with stage 1 through stage 4 chronic kidney disease, or unspecified chronic kidney disease: Secondary | ICD-10-CM | POA: Diagnosis not present

## 2023-08-27 DIAGNOSIS — E1122 Type 2 diabetes mellitus with diabetic chronic kidney disease: Secondary | ICD-10-CM | POA: Diagnosis not present

## 2023-08-27 DIAGNOSIS — N1831 Chronic kidney disease, stage 3a: Secondary | ICD-10-CM | POA: Diagnosis not present

## 2023-08-28 ENCOUNTER — Other Ambulatory Visit (HOSPITAL_COMMUNITY): Payer: Self-pay | Admitting: Nephrology

## 2023-08-28 DIAGNOSIS — E1129 Type 2 diabetes mellitus with other diabetic kidney complication: Secondary | ICD-10-CM

## 2023-08-29 ENCOUNTER — Other Ambulatory Visit: Payer: Self-pay

## 2023-08-29 DIAGNOSIS — I4729 Other ventricular tachycardia: Secondary | ICD-10-CM

## 2023-08-29 DIAGNOSIS — I10 Essential (primary) hypertension: Secondary | ICD-10-CM

## 2023-08-29 MED ORDER — METOPROLOL SUCCINATE ER 25 MG PO TB24: 25.0000 mg | ORAL_TABLET | Freq: Every day | ORAL | 2 refills | Status: DC

## 2023-08-30 DIAGNOSIS — H52203 Unspecified astigmatism, bilateral: Secondary | ICD-10-CM | POA: Diagnosis not present

## 2023-08-30 DIAGNOSIS — H401131 Primary open-angle glaucoma, bilateral, mild stage: Secondary | ICD-10-CM | POA: Diagnosis not present

## 2023-08-30 DIAGNOSIS — E119 Type 2 diabetes mellitus without complications: Secondary | ICD-10-CM | POA: Diagnosis not present

## 2023-09-04 ENCOUNTER — Encounter: Payer: Self-pay | Admitting: Podiatry

## 2023-09-04 ENCOUNTER — Ambulatory Visit: Payer: Medicare PPO | Admitting: Podiatry

## 2023-09-04 DIAGNOSIS — E1142 Type 2 diabetes mellitus with diabetic polyneuropathy: Secondary | ICD-10-CM

## 2023-09-04 DIAGNOSIS — B351 Tinea unguium: Secondary | ICD-10-CM | POA: Diagnosis not present

## 2023-09-04 DIAGNOSIS — L84 Corns and callosities: Secondary | ICD-10-CM | POA: Diagnosis not present

## 2023-09-04 DIAGNOSIS — M79674 Pain in right toe(s): Secondary | ICD-10-CM

## 2023-09-04 DIAGNOSIS — M79675 Pain in left toe(s): Secondary | ICD-10-CM | POA: Diagnosis not present

## 2023-09-04 NOTE — Progress Notes (Signed)
  Subjective:  Patient ID: Jocelyn Kaufman, female    DOB: 01/28/1956,  MRN: [id number]  67 y.o. female presents at risk foot care with history of diabetic neuropathy and painful elongated mycotic toenails 1-5 bilaterally which are tender when wearing enclosed shoe gear. Pain is relieved with periodic professional debridement.  Chief Complaint  Patient presents with   [facility]    [facility] BS - 113 A1C - 6.4 LVPCP - 06/2023    New problem(s): None   PCP is Merri Brunette, MD. Theron Arista September, 2024.  Allergies  Allergen Reactions   Semaglutide(0.25 Or 0.5mg -Dos) Other (See Comments)    Causes cataracts requiring surgery and impairing vision    Shellfish-Derived Products Anaphylaxis, Swelling and Other (See Comments)    Patient avoids   Empagliflozin Itching, Other (See Comments) and Swelling    UTI sx's and yeast infections   Semaglutide Other (See Comments)    Other reaction(s): worsening DM RETINOPATHY   Tramadol Hcl Other (See Comments)    hallucinations   Canagliflozin Other (See Comments)    Urinary incontinence    Latex Rash and Other (See Comments)   Liraglutide Itching    Other reaction(s): itching all over   Minoxidil Rash    skin irritation   Omeprazole Rash and Other (See Comments)   Percocet [Oxycodone-Acetaminophen] Nausea And Vomiting    Review of Systems: Negative except as noted in the HPI.   Objective:  [name] is a pleasant 67 y.o. female in NAD. AAO x 3.  Vascular Examination: Vascular status intact b/l with palpable pedal pulses. CFT immediate b/l. Pedal hair present. No edema. No pain with calf compression b/l. Skin temperature gradient WNL b/l. No varicosities noted. No cyanosis or clubbing noted.  Neurological Examination: Sensation grossly intact b/l with 10 gram monofilament. Vibratory sensation intact b/l. Pt has subjective symptoms of neuropathy.  Dermatological Examination: Pedal skin with normal turgor, texture and tone b/l. No open wounds nor  interdigital macerations noted. Toenails 1-5 b/l thick, discolored, elongated with subungual debris and pain on dorsal palpation. Hyperkeratotic lesion(s) R 5th toe.  No erythema, no edema, no drainage, no fluctuance.   Musculoskeletal Examination: Muscle strength 5/5 to b/l LE.  No pain, crepitus noted b/l. 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  Radiographs: None Assessment:   1. Pain due to onychomycosis of toenails of both feet   2. Corns   3. Diabetic peripheral neuropathy associated with type 2 diabetes mellitus (HCC)    Plan:  -Consent given for treatment as described below: -Examined patient. -Continue foot and shoe inspections daily. Monitor blood glucose per PCP/Endocrinologist's recommendations. -Continue supportive shoe gear daily. -Mycotic toenails 1-5 bilaterally were debrided in length and girth with sterile nail nippers and dremel without incident. -Corn(s) R 5th toe pared utilizing sharp debridement with sterile blade without complication or incident. Total number debrided=1. -Patient/POA to call should there be question/concern in the interim.  Return in about 3 months (around 12/05/2023).  Freddie Breech, DPM

## 2023-09-10 ENCOUNTER — Inpatient Hospital Stay: Payer: Medicare PPO | Attending: Hematology and Oncology

## 2023-09-10 ENCOUNTER — Other Ambulatory Visit: Payer: Self-pay | Admitting: Hematology and Oncology

## 2023-09-10 ENCOUNTER — Inpatient Hospital Stay: Payer: Medicare PPO

## 2023-09-10 VITALS — BP 145/70 | HR 70 | Temp 98.5°F | Resp 17

## 2023-09-10 DIAGNOSIS — D649 Anemia, unspecified: Secondary | ICD-10-CM

## 2023-09-10 DIAGNOSIS — N183 Chronic kidney disease, stage 3 unspecified: Secondary | ICD-10-CM | POA: Insufficient documentation

## 2023-09-10 DIAGNOSIS — D631 Anemia in chronic kidney disease: Secondary | ICD-10-CM | POA: Diagnosis not present

## 2023-09-10 DIAGNOSIS — E1122 Type 2 diabetes mellitus with diabetic chronic kidney disease: Secondary | ICD-10-CM | POA: Insufficient documentation

## 2023-09-10 DIAGNOSIS — D638 Anemia in other chronic diseases classified elsewhere: Secondary | ICD-10-CM

## 2023-09-10 DIAGNOSIS — I129 Hypertensive chronic kidney disease with stage 1 through stage 4 chronic kidney disease, or unspecified chronic kidney disease: Secondary | ICD-10-CM | POA: Insufficient documentation

## 2023-09-10 LAB — CBC WITH DIFFERENTIAL (CANCER CENTER ONLY)
Abs Immature Granulocytes: 0.01 10*3/uL (ref 0.00–0.07)
Basophils Absolute: 0 10*3/uL (ref 0.0–0.1)
Basophils Relative: 1 %
Eosinophils Absolute: 0.1 10*3/uL (ref 0.0–0.5)
Eosinophils Relative: 2 %
HCT: 33.4 % — ABNORMAL LOW (ref 36.0–46.0)
Hemoglobin: 10.4 g/dL — ABNORMAL LOW (ref 12.0–15.0)
Immature Granulocytes: 0 %
Lymphocytes Relative: 25 %
Lymphs Abs: 1.3 10*3/uL (ref 0.7–4.0)
MCH: 28.5 pg (ref 26.0–34.0)
MCHC: 31.1 g/dL (ref 30.0–36.0)
MCV: 91.5 fL (ref 80.0–100.0)
Monocytes Absolute: 0.3 10*3/uL (ref 0.1–1.0)
Monocytes Relative: 5 %
Neutro Abs: 3.4 10*3/uL (ref 1.7–7.7)
Neutrophils Relative %: 67 %
Platelet Count: 207 10*3/uL (ref 150–400)
RBC: 3.65 MIL/uL — ABNORMAL LOW (ref 3.87–5.11)
RDW: 15.3 % (ref 11.5–15.5)
WBC Count: 5.2 10*3/uL (ref 4.0–10.5)
nRBC: 0 % (ref 0.0–0.2)

## 2023-09-10 LAB — CMP (CANCER CENTER ONLY)
ALT: 18 U/L (ref 0–44)
AST: 23 U/L (ref 15–41)
Albumin: 3.7 g/dL (ref 3.5–5.0)
Alkaline Phosphatase: 77 U/L (ref 38–126)
Anion gap: 4 — ABNORMAL LOW (ref 5–15)
BUN: 21 mg/dL (ref 8–23)
CO2: 27 mmol/L (ref 22–32)
Calcium: 9.3 mg/dL (ref 8.9–10.3)
Chloride: 110 mmol/L (ref 98–111)
Creatinine: 1.1 mg/dL — ABNORMAL HIGH (ref 0.44–1.00)
GFR, Estimated: 55 mL/min — ABNORMAL LOW (ref >60)
Glucose, Bld: 117 mg/dL — ABNORMAL HIGH (ref 70–99)
Potassium: 4.7 mmol/L (ref 3.5–5.1)
Sodium: 141 mmol/L (ref 135–145)
Total Bilirubin: 0.4 mg/dL (ref <1.2)
Total Protein: 6.3 g/dL — ABNORMAL LOW (ref 6.5–8.1)

## 2023-09-10 LAB — FERRITIN: Ferritin: 176 ng/mL (ref 11–307)

## 2023-09-10 LAB — IRON AND IRON BINDING CAPACITY (CC-WL,HP ONLY)
Iron: 95 ug/dL (ref 28–170)
Saturation Ratios: 37 % — ABNORMAL HIGH (ref 10.4–31.8)
TIBC: 259 ug/dL (ref 250–450)
UIBC: 164 ug/dL (ref 148–442)

## 2023-09-10 LAB — VITAMIN B12: Vitamin B-12: 892 pg/mL (ref 180–914)

## 2023-09-10 MED ORDER — EPOETIN ALFA-EPBX 40000 UNIT/ML IJ SOLN
40000.0000 [IU] | Freq: Once | INTRAMUSCULAR | Status: AC
  Administered 2023-09-10: 40000 [IU] via SUBCUTANEOUS
  Filled 2023-09-10: qty 1

## 2023-09-13 ENCOUNTER — Other Ambulatory Visit: Payer: Self-pay | Admitting: Radiology

## 2023-09-13 DIAGNOSIS — R809 Proteinuria, unspecified: Secondary | ICD-10-CM

## 2023-09-16 ENCOUNTER — Other Ambulatory Visit (HOSPITAL_COMMUNITY): Payer: Self-pay | Admitting: Student

## 2023-09-17 ENCOUNTER — Other Ambulatory Visit: Payer: Self-pay

## 2023-09-17 ENCOUNTER — Other Ambulatory Visit (HOSPITAL_COMMUNITY): Payer: Self-pay | Admitting: Nephrology

## 2023-09-17 ENCOUNTER — Encounter (HOSPITAL_COMMUNITY): Payer: Self-pay

## 2023-09-17 ENCOUNTER — Ambulatory Visit (HOSPITAL_COMMUNITY)
Admission: RE | Admit: 2023-09-17 | Discharge: 2023-09-17 | Disposition: A | Discharge status: Home / Self Care | Payer: Medicare PPO | Source: Ambulatory Visit | Attending: Nephrology | Admitting: Nephrology

## 2023-09-17 DIAGNOSIS — E1129 Type 2 diabetes mellitus with other diabetic kidney complication: Secondary | ICD-10-CM | POA: Diagnosis not present

## 2023-09-17 DIAGNOSIS — N189 Chronic kidney disease, unspecified: Secondary | ICD-10-CM | POA: Insufficient documentation

## 2023-09-17 DIAGNOSIS — G4733 Obstructive sleep apnea (adult) (pediatric): Secondary | ICD-10-CM | POA: Diagnosis not present

## 2023-09-17 DIAGNOSIS — R809 Proteinuria, unspecified: Secondary | ICD-10-CM

## 2023-09-17 DIAGNOSIS — M069 Rheumatoid arthritis, unspecified: Secondary | ICD-10-CM | POA: Diagnosis not present

## 2023-09-17 DIAGNOSIS — E1122 Type 2 diabetes mellitus with diabetic chronic kidney disease: Secondary | ICD-10-CM | POA: Diagnosis not present

## 2023-09-17 DIAGNOSIS — E1136 Type 2 diabetes mellitus with diabetic cataract: Secondary | ICD-10-CM | POA: Insufficient documentation

## 2023-09-17 DIAGNOSIS — I129 Hypertensive chronic kidney disease with stage 1 through stage 4 chronic kidney disease, or unspecified chronic kidney disease: Secondary | ICD-10-CM | POA: Diagnosis not present

## 2023-09-17 LAB — PROTIME-INR
INR: 0.9 (ref 0.8–1.2)
Prothrombin Time: 12.6 s (ref 11.4–15.2)

## 2023-09-17 LAB — CBC
HCT: 32.6 % — ABNORMAL LOW (ref 36.0–46.0)
Hemoglobin: 9.5 g/dL — ABNORMAL LOW (ref 12.0–15.0)
MCH: 27 pg (ref 26.0–34.0)
MCHC: 29.1 g/dL — ABNORMAL LOW (ref 30.0–36.0)
MCV: 92.6 fL (ref 80.0–100.0)
Platelets: 215 10*3/uL (ref 150–400)
RBC: 3.52 MIL/uL — ABNORMAL LOW (ref 3.87–5.11)
RDW: 16.6 % — ABNORMAL HIGH (ref 11.5–15.5)
WBC: 5.2 10*3/uL (ref 4.0–10.5)
nRBC: 0.4 % — ABNORMAL HIGH (ref 0.0–0.2)

## 2023-09-17 LAB — GLUCOSE, CAPILLARY: Glucose-Capillary: 78 mg/dL (ref 70–99)

## 2023-09-17 MED ORDER — SODIUM CHLORIDE 0.9% FLUSH: 10.0000 mL | Freq: Two times a day (BID) | INTRAVENOUS | Status: DC

## 2023-09-17 MED ORDER — LIDOCAINE HCL (PF) 1 % IJ SOLN
30.0000 mL | Freq: Once | INTRAMUSCULAR | Status: AC
  Administered 2023-09-17: 30 mL via INTRADERMAL
  Filled 2023-09-17: qty 30

## 2023-09-17 MED ORDER — ACETAMINOPHEN 500 MG PO TABS
1000.0000 mg | ORAL_TABLET | Freq: Once | ORAL | Status: AC
  Administered 2023-09-17: 1000 mg via ORAL
  Filled 2023-09-17 (×2): qty 2

## 2023-09-17 MED ORDER — MIDAZOLAM HCL 2 MG/2ML IJ SOLN
INTRAMUSCULAR | Status: AC
  Filled 2023-09-17: qty 2

## 2023-09-17 MED ORDER — FENTANYL CITRATE (PF) 100 MCG/2ML IJ SOLN
INTRAMUSCULAR | Status: AC
  Filled 2023-09-17: qty 2

## 2023-09-17 MED ORDER — FENTANYL CITRATE (PF) 100 MCG/2ML IJ SOLN
INTRAMUSCULAR | Status: AC | PRN
  Administered 2023-09-17: 25 ug via INTRAVENOUS
  Administered 2023-09-17: 50 ug via INTRAVENOUS
  Administered 2023-09-17: 25 ug via INTRAVENOUS

## 2023-09-17 MED ORDER — MIDAZOLAM HCL 2 MG/2ML IJ SOLN
INTRAMUSCULAR | Status: AC | PRN
  Administered 2023-09-17: .5 mg via INTRAVENOUS
  Administered 2023-09-17: 1 mg via INTRAVENOUS
  Administered 2023-09-17 (×5): .5 mg via INTRAVENOUS

## 2023-09-17 NOTE — H&P (Signed)
Chief Complaint: Patient was seen in consultation today for random renal biopsy at the request of Vallery Sa C  Referring Physician(s): Estanislado Emms  Supervising Physician: Roanna Banning  Patient Status: [facility] - Out-pt  History of Present Illness: Jocelyn Kaufman is a 67 y.o. female   FULL Code status per pt  Hx HTN; DM; RA; OSA Pt has followed with Dr Malen Gauze for CKD Persistent proteinuria Now scheduled for random renal biopsy   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   Urticaria     Past Surgical History:  Procedure Laterality Date   ADENOIDECTOMY     APPENDECTOMY     biopsy Left 12/2021   shin   biospy Right 04/2021   R 5th digit   biospy Left 04/2021   foot plantar surface   colonoscopy and endoscopy  2021   CYST REMOVAL LEG Right    Removed from right thigh joint   EYE SURGERY Bilateral 09/2021   cataract   foot surgery Left 09/2021   Excision lesion plantar left foot   LIGAMENT REPAIR Right 09/26/2022   Procedure: LIGAMENT REPAIR/knee;  Surgeon: Bjorn Pippin, MD;  Location: [facility] OR;  Service: Orthopedics;  Laterality: Right;   ORIF HUMERUS FRACTURE Right 07/09/2018   Procedure: OPEN REDUCTION INTERNAL FIXATION (ORIF) HUMERAL SHAFT FRACTURE;  Surgeon: Bjorn Pippin, MD;  Location: [facility] OR;  Service: Orthopedics;  Laterality: Right;   QUADRICEPS TENDON REPAIR Right 09/26/2022   Procedure: REVISION REPAIR QUADRICEP TENDON;  Surgeon: Bjorn Pippin, MD;  Location: [facility] OR;  Service:  Orthopedics;  Laterality: Right;   TONSILLECTOMY     torn right knee tendon repair Right    2023   TOTAL VAGINAL HYSTERECTOMY     UMBILICAL HERNIA REPAIR      Allergies: Semaglutide(0.25 or 0.5mg -dos), Shellfish-derived products, Empagliflozin, Semaglutide, Tramadol hcl, Canagliflozin, Latex, Liraglutide, Minoxidil, Omeprazole, and Percocet [oxycodone-acetaminophen]  Medications: Prior to Admission medications   Medication Sig Start Date End Date Taking? Authorizing Provider  albuterol (VENTOLIN HFA) 108 (90 Base) MCG/ACT inhaler Inhale two puffs every four to six hours as needed for cough or wheeze. Patient taking differently: Inhale 1-2 puffs into the lungs every 6 (six) hours as needed for wheezing. 10/05/21  Yes Padgett, Pilar Grammes, MD  amLODipine (NORVASC) 10 MG tablet Take 1 tablet (10 mg total) by mouth daily. 02/01/22  Yes Serena Croissant, MD  atorvastatin (LIPITOR) 40 MG tablet Take 1 tablet (40 mg total) by mouth daily at 6 PM. Patient taking differently: Take 40 mg by mouth daily. 07/12/18  Yes Berton Mount I, MD  CHELATED IRON PO Take 18 mg by mouth daily.   Yes [provider]  chlorthalidone (HYGROTON) 25 MG tablet Take 0.5 tablets (12.5 mg total) by mouth daily. 06/21/23 09/19/23 Yes Alver Sorrow, NP  Cholecalciferol (VITAMIN D3) 125 MCG (5000 UT) CAPS Take 5,000  Units by mouth daily.   Yes [provider]  clindamycin (CLEOCIN T) 1 % external solution Apply to underarm daily to twice as needed. 05/27/23  Yes [provider]  CLOBETASOL PROPIONATE E 0.05 % emollient cream Apply topically 3 (three) times daily. 06/05/23  Yes [provider]  Dulaglutide (TRULICITY) 1.5 MG/0.5ML SOPN Inject 1.5 mg into the skin once a week. 03/05/23  Yes   epoetin alfa-epbx (RETACRIT) 2000 UNIT/ML injection See admin instructions.   Yes [provider]  escitalopram (LEXAPRO) 20 MG tablet Take 20 mg by mouth daily.   Yes [provider]   ezetimibe (ZETIA) 10 MG tablet Take 1 tablet (10 mg total) by mouth daily. 04/25/20  Yes Serena Croissant, MD  fexofenadine (ALLEGRA ALLERGY) 180 MG tablet Take 1 tablet (180 mg total) by mouth daily. 11/21/21  Yes Serena Croissant, MD  folic acid (FOLVITE) 1 MG tablet Take 2 mg by mouth daily.   Yes [provider]  hydrALAZINE (APRESOLINE) 50 MG tablet Take 50 mg by mouth 3 (three) times daily.   Yes [provider]  insulin glargine, 2 Unit Dial, (TOUJEO MAX SOLOSTAR) 300 UNIT/ML Solostar Pen Inject 42 Units into the skin at bedtime.   Yes [provider]  irbesartan (AVAPRO) 300 MG tablet Take 1 tablet (300 mg total) by mouth daily. 02/01/22  Yes Serena Croissant, MD  KERENDIA 10 MG TABS 10 mg daily. 05/14/23  Yes [provider]  latanoprost (XALATAN) 0.005 % ophthalmic solution Place 1 drop into both eyes at bedtime.   Yes [provider]  Methotrexate Sodium (METHOTREXATE, PF,) 50 MG/2ML injection Inject 20 mg into the muscle every Wednesday. 0.6 ml 06/26/22  Yes [provider]  metoprolol succinate (TOPROL-XL) 25 MG 24 hr tablet Take 1 tablet (25 mg total) by mouth daily. May take additional half tablet as needed once daily for palpitations. 08/29/23  Yes Alver Sorrow, NP  Multiple Vitamins-Minerals (CENTRUM SILVER PO) Take 1 tablet by mouth daily with breakfast.   Yes [provider]  sulfaSALAzine (AZULFIDINE) 500 MG tablet Take 3 tablets (1,500 mg total) by mouth 2 (two) times daily. 11/21/21  Yes Serena Croissant, MD  SYNTHROID 125 MCG tablet Take 125 mcg by mouth every Monday, Tuesday, Wednesday, Thursday, and Friday.   Yes [provider]  vitamin B-12 (CYANOCOBALAMIN) 1000 MCG tablet Take 1,000 mcg by mouth daily.   Yes [provider]  ACCU-CHEK GUIDE test strip  11/16/22   [provider]  Accu-Chek Softclix Lancets lancets use to check blood sugar 04/26/21   [provider]  Aflibercept (EYLEA) 2  MG/0.05ML SOLN 2 mg by Intravitreal route every 3 (three) months. 04/25/20   Serena Croissant, MD  aspirin EC 81 MG tablet Take 81 mg by mouth daily. Swallow whole.    [provider]  B-D ULTRAFINE III SHORT PEN 31G X 8 MM MISC SMARTSIG:1 Each SUB-Q Daily 02/12/20   [provider]  EPINEPHrine (EPIPEN 2-PAK) 0.3 mg/0.3 mL IJ SOAJ injection Use as directed for life-threatening allergic reaction. 04/05/22   Marcelyn Bruins, MD  hydrocortisone cream 1 % Apply 1 Application topically 2 (two) times daily as needed for itching.    [provider]  inFLIXimab (REMICADE IV) Inject into the vein.    [provider]  inFLIXimab (REMICADE) 100 MG injection Inject into the vein.    [provider]  insulin aspart (NOVOLOG) 100 UNIT/ML FlexPen Inject into the skin See admin instructions.  Sliding Scale    [provider]  Lancets Misc. (ACCU-CHEK SOFTCLIX LANCET DEV) KIT See admin instructions. 04/25/21   [provider]  lidocaine (LMX) 4 % cream Apply 1 Application topically as needed (pain).    [provider]  triamcinolone cream (KENALOG) 0.1 % Apply 1 Application topically 2 (two) times daily as needed (for itching- affected areas). 08/03/22   [provider]     Family History  Problem Relation Age of Onset   Hypertension Mother    Stroke Mother    Kidney failure Mother    Heart disease Mother        heart murmur   Glaucoma Mother    Stroke Father    COPD Father    Cancer - Prostate Father    Colon cancer Father    Diabetes Sister    Thyroid cancer Sister    Epilepsy Sister    Ovarian cancer Paternal Aunt     Social History   Socioeconomic History   Marital status: Single    Spouse name: Not on file   Number of children: 0   Years of education: Masters   Highest education level: Not on file  Occupational History   Occupation: [employer]  Tobacco Use   Smoking status: Never   Smokeless  tobacco: Never  Vaping Use   Vaping status: Never Used  Substance and Sexual Activity   Alcohol use: Not Currently    Comment: occ   Drug use: No   Sexual activity: Not Currently  Other Topics Concern   Not on file  Social History Narrative   Lives alone   Caffeine use: 2 cups coffee per day   Tea sometimes   Soda- 3 x/week   Right-handed   Retired.    Masters Degree   No Children   Single.   Social Determinants of Health   Financial Resource Strain: Low Risk  (04/25/2023)   Overall Financial Resource Strain (CARDIA)    Difficulty of Paying Living Expenses: Not very hard  Food Insecurity: No Food Insecurity (04/25/2023)   Hunger Vital Sign    Worried About Running Out of Food in the Last Year: Never true    Ran Out of Food in the Last Year: Never true  Transportation Needs: No Transportation Needs (04/25/2023)   PRAPARE - Administrator, Civil Service (Medical): No    Lack of Transportation (Non-Medical): No  Physical Activity: Not on file  Stress: Stress Concern Present (04/25/2023)   [employer] of Occupational Health - Occupational Stress Questionnaire    Feeling of Stress : Rather much  Social Connections: Not on file    Review of Systems: A 12 point ROS discussed and pertinent positives are indicated in the HPI above.  All other systems are negative.  Review of Systems  Constitutional:  Negative for fatigue and fever.  Respiratory:  Negative for cough and shortness of breath.   Cardiovascular:  Negative for chest pain.  Gastrointestinal:  Negative for abdominal pain, nausea and vomiting.  Musculoskeletal:  Negative for back pain and gait problem.  Neurological:  Negative for weakness.  Psychiatric/Behavioral:  Negative for behavioral problems and confusion.     Vital Signs: BP (!) 140/74   Pulse 68   Temp 98 F (36.7 C) (Temporal)   Resp 18   Ht 5' 4.5" (1.638 m)   Wt 231 lb (104.8 kg)   SpO2 98%   BMI 39.04 kg/m   Advance Care Plan:  The advanced care plan/surrogate decision maker was discussed at the time of visit and documented in the medical record.    Physical Exam Vitals reviewed.  HENT:     Mouth/Throat:     Mouth: Mucous membranes are moist.  Cardiovascular:     Rate and Rhythm: Normal rate and regular rhythm.     Heart sounds: Normal heart sounds.  Pulmonary:     Effort: Pulmonary effort is normal.     Breath sounds: Normal breath sounds. No wheezing.  Abdominal:     Palpations: Abdomen is soft.  Musculoskeletal:        General: Normal range of motion.     Right lower leg: No edema.     Left lower leg: No edema.  Skin:    General: Skin is dry.  Neurological:     Mental Status: She is alert and oriented to person, place, and time.  Psychiatric:        Behavior: Behavior normal.     Imaging: No results found.  Labs:  CBC: Recent Labs    07/17/23 1023 08/13/23 1340 09/10/23 0922 09/17/23 0704  WBC 7.3 5.8 5.2 5.2  HGB 10.2* 10.8* 10.4* 9.5*  HCT 32.9* 34.8* 33.4* 32.6*  PLT 160 225 207 215    COAGS: No results for input(s): "INR", "APTT" in the last 8760 hours.  BMP: Recent Labs    05/14/23 1257 07/17/23 1023 08/13/23 1340 09/10/23 0922  NA 139 140 141 141  K 5.0 4.3 4.3 4.7  CL 109 110 110 110  CO2 24 28 27 27   GLUCOSE 85 101* 144* 117*  BUN 33* 30* 32* 21  CALCIUM 8.9 8.8* 9.1 9.3  CREATININE 1.08* 1.14* 1.47* 1.10*  GFRNONAA 56* 53* 39* 55*    LIVER FUNCTION TESTS: Recent Labs    05/14/23 1257 07/17/23 1023 08/13/23 1340 09/10/23 0922  BILITOT 0.4 0.3 0.3 0.4  AST 29 17 22 23   ALT 29 15 17 18   ALKPHOS 57 65 76 77  PROT 6.0* 6.0* 6.2* 6.3*  ALBUMIN 3.6 3.4* 3.5 3.7    TUMOR MARKERS: No results for input(s): "AFPTM", "CEA", "CA199", "CHROMGRNA" in the last 8760 hours.  Assessment and Plan:  Scheduled for random renal biopsy in IR Risks and benefits of random renal biopsy was discussed with the patient and/or patient's family including, but not limited  to bleeding, infection, damage to adjacent structures or low yield requiring additional tests.  All of the questions were answered and there is agreement to proceed.  Consent signed and in chart.  Thank you for this interesting consult.  I greatly enjoyed meeting KIYANNA DELMEDICO and look forward to participating in their care.  A copy of this report was sent to the requesting provider on this date.  Electronically Signed: Robet Leu, PA-C 09/17/2023, 7:10 AM   I spent a total of  30 Minutes   in face to face in clinical consultation, greater than 50% of which was counseling/coordinating care for Random ranl biopsy

## 2023-09-17 NOTE — Procedures (Signed)
Vascular and Interventional Radiology Procedure Note  Patient: Jocelyn Kaufman DOB: 28-Mar-1956 Medical Record Number: [id number] Note Date/Time: 09/17/23 8:22 AM   Performing Physician: Roanna Banning, MD Assistant(s): None  Diagnosis: Proteinuria   Procedure: RENAL BIOPSY, NON-TARGETED  Anesthesia: Conscious Sedation Complications: None Estimated Blood Loss: Minimal Specimens: Sent for Pathology  Findings:  Successful CT-guided biopsy of kidney. A total of 3 samples were obtained. Hemostasis of the tract was achieved using Manual Pressure.  Plan: Bed rest for 2 hours.  See detailed procedure note with images in PACS. The patient tolerated the procedure well without incident or complication and was returned to Recovery in stable condition.    Roanna Banning, MD [facility]   Pager. [phone] Clinic. [phone]

## 2023-09-23 ENCOUNTER — Encounter (HOSPITAL_COMMUNITY): Payer: Self-pay

## 2023-09-23 DIAGNOSIS — N1831 Chronic kidney disease, stage 3a: Secondary | ICD-10-CM | POA: Diagnosis not present

## 2023-09-24 LAB — SURGICAL PATHOLOGY

## 2023-09-30 DIAGNOSIS — E1122 Type 2 diabetes mellitus with diabetic chronic kidney disease: Secondary | ICD-10-CM | POA: Diagnosis not present

## 2023-09-30 DIAGNOSIS — R809 Proteinuria, unspecified: Secondary | ICD-10-CM | POA: Diagnosis not present

## 2023-09-30 DIAGNOSIS — N1831 Chronic kidney disease, stage 3a: Secondary | ICD-10-CM | POA: Diagnosis not present

## 2023-09-30 DIAGNOSIS — I129 Hypertensive chronic kidney disease with stage 1 through stage 4 chronic kidney disease, or unspecified chronic kidney disease: Secondary | ICD-10-CM | POA: Diagnosis not present

## 2023-10-08 ENCOUNTER — Inpatient Hospital Stay: Payer: Medicare PPO

## 2023-10-08 ENCOUNTER — Inpatient Hospital Stay: Payer: Medicare PPO | Attending: Hematology and Oncology

## 2023-10-08 ENCOUNTER — Inpatient Hospital Stay (HOSPITAL_BASED_OUTPATIENT_CLINIC_OR_DEPARTMENT_OTHER): Payer: Medicare PPO | Admitting: Hematology and Oncology

## 2023-10-08 ENCOUNTER — Other Ambulatory Visit: Payer: Self-pay | Admitting: *Deleted

## 2023-10-08 VITALS — BP 137/61 | HR 72 | Temp 97.2°F | Resp 18 | Ht 64.5 in | Wt 231.6 lb

## 2023-10-08 DIAGNOSIS — D638 Anemia in other chronic diseases classified elsewhere: Secondary | ICD-10-CM

## 2023-10-08 DIAGNOSIS — N183 Chronic kidney disease, stage 3 unspecified: Secondary | ICD-10-CM | POA: Diagnosis not present

## 2023-10-08 DIAGNOSIS — E1122 Type 2 diabetes mellitus with diabetic chronic kidney disease: Secondary | ICD-10-CM | POA: Diagnosis not present

## 2023-10-08 DIAGNOSIS — D631 Anemia in chronic kidney disease: Secondary | ICD-10-CM | POA: Insufficient documentation

## 2023-10-08 DIAGNOSIS — D649 Anemia, unspecified: Secondary | ICD-10-CM

## 2023-10-08 DIAGNOSIS — Z7984 Long term (current) use of oral hypoglycemic drugs: Secondary | ICD-10-CM | POA: Diagnosis not present

## 2023-10-08 DIAGNOSIS — N1831 Chronic kidney disease, stage 3a: Secondary | ICD-10-CM | POA: Insufficient documentation

## 2023-10-08 LAB — CBC WITH DIFFERENTIAL (CANCER CENTER ONLY)
Abs Immature Granulocytes: 0.01 10*3/uL (ref 0.00–0.07)
Basophils Absolute: 0.1 10*3/uL (ref 0.0–0.1)
Basophils Relative: 1 %
Eosinophils Absolute: 0.1 10*3/uL (ref 0.0–0.5)
Eosinophils Relative: 2 %
HCT: 33.3 % — ABNORMAL LOW (ref 36.0–46.0)
Hemoglobin: 9.9 g/dL — ABNORMAL LOW (ref 12.0–15.0)
Immature Granulocytes: 0 %
Lymphocytes Relative: 23 %
Lymphs Abs: 1.3 10*3/uL (ref 0.7–4.0)
MCH: 27.3 pg (ref 26.0–34.0)
MCHC: 29.7 g/dL — ABNORMAL LOW (ref 30.0–36.0)
MCV: 92 fL (ref 80.0–100.0)
Monocytes Absolute: 0.4 10*3/uL (ref 0.1–1.0)
Monocytes Relative: 7 %
Neutro Abs: 3.8 10*3/uL (ref 1.7–7.7)
Neutrophils Relative %: 67 %
Platelet Count: 276 10*3/uL (ref 150–400)
RBC: 3.62 MIL/uL — ABNORMAL LOW (ref 3.87–5.11)
RDW: 15.7 % — ABNORMAL HIGH (ref 11.5–15.5)
WBC Count: 5.6 10*3/uL (ref 4.0–10.5)
nRBC: 0 % (ref 0.0–0.2)

## 2023-10-08 LAB — IRON AND IRON BINDING CAPACITY (CC-WL,HP ONLY)
Iron: 68 ug/dL (ref 28–170)
Saturation Ratios: 27 % (ref 10.4–31.8)
TIBC: 251 ug/dL (ref 250–450)
UIBC: 183 ug/dL (ref 148–442)

## 2023-10-08 LAB — FERRITIN: Ferritin: 137 ng/mL (ref 11–307)

## 2023-10-08 LAB — VITAMIN B12: Vitamin B-12: 901 pg/mL (ref 180–914)

## 2023-10-08 MED ORDER — DAPAGLIFLOZIN PROPANEDIOL 5 MG PO TABS: 5.0000 mg | ORAL_TABLET | Freq: Every day | ORAL | Status: AC

## 2023-10-08 MED ORDER — EPOETIN ALFA-EPBX 40000 UNIT/ML IJ SOLN
40000.0000 [IU] | Freq: Once | INTRAMUSCULAR | Status: AC
  Administered 2023-10-08: 40000 [IU] via SUBCUTANEOUS
  Filled 2023-10-08: qty 1

## 2023-10-08 NOTE — Progress Notes (Signed)
Patient Care Team: Merri Brunette, MD as PCP - General (Internal Medicine) Rennis Golden Lisette Abu, MD as PCP - Cardiology (Cardiology) Serena Croissant, MD as Consulting Physician (Hematology and Oncology) Casimer Lanius, MD as Consulting Physician (Rheumatology) Marlene Lard, MD as Consulting Physician (Endocrinology) Janet Berlin, MD as Consulting Physician (Ophthalmology) Estanislado Emms, MD as Consulting Physician (Nephrology)  DIAGNOSIS:  Encounter Diagnosis  Name Primary?   Anemia in stage 3 chronic kidney disease, unspecified whether stage 3a or 3b CKD (HCC) Yes      CHIEF COMPLIANT: Follow-up of anemia of chronic kidney disease stage IIIa  HISTORY OF PRESENT ILLNESS:   History of Present Illness   The patient, with a history of diabetes, rheumatoid arthritis, and chronic kidney disease (CKD), presents for follow-up after a recent kidney biopsy. She reports feeling more tired since the procedure. The biopsy revealed scarring from diabetes.  The patient's hemoglobin has been fluctuating, with the best number in the last three months being 11.2. It has stabilized in the tens over the last couple of months, but there was a significant drop to 9.5 three weeks ago. Today, it is 9.9. The patient is receiving periodic iron injections, and iron and B12 levels are pending.  The patient also manages a complex medication regimen, which includes [place] and Chauncey Mann, among others. She organizes her medications by the week and takes certain ones on certain days.         ALLERGIES:  is allergic to semaglutide(0.25 or 0.5mg -dos), shellfish-derived products, empagliflozin, semaglutide, tramadol hcl, canagliflozin, latex, liraglutide, minoxidil, omeprazole, and percocet [oxycodone-acetaminophen].  MEDICATIONS:  Current Outpatient Medications  Medication Sig Dispense Refill   dapagliflozin propanediol (FARXIGA) 5 MG TABS tablet Take 1 tablet (5 mg total) by mouth daily before breakfast.      ACCU-CHEK GUIDE test strip      Accu-Chek Softclix Lancets lancets use to check blood sugar     Aflibercept (EYLEA) 2 MG/0.05ML SOLN 2 mg by Intravitreal route every 3 (three) months.     albuterol (VENTOLIN HFA) 108 (90 Base) MCG/ACT inhaler Inhale two puffs every four to six hours as needed for cough or wheeze. (Patient taking differently: Inhale 1-2 puffs into the lungs every 6 (six) hours as needed for wheezing.) 18 g 2   amLODipine (NORVASC) 10 MG tablet Take 1 tablet (10 mg total) by mouth daily.     aspirin EC 81 MG tablet Take 81 mg by mouth daily. Swallow whole.     atorvastatin (LIPITOR) 40 MG tablet Take 1 tablet (40 mg total) by mouth daily at 6 PM. (Patient taking differently: Take 40 mg by mouth daily.) 30 tablet 0   B-D ULTRAFINE III SHORT PEN 31G X 8 MM MISC SMARTSIG:1 Each SUB-Q Daily     CHELATED IRON PO Take 18 mg by mouth daily.     chlorthalidone (HYGROTON) 25 MG tablet Take 0.5 tablets (12.5 mg total) by mouth daily. 45 tablet 3   Cholecalciferol (VITAMIN D3) 125 MCG (5000 UT) CAPS Take 5,000 Units by mouth daily.     clindamycin (CLEOCIN T) 1 % external solution Apply to underarm daily to twice as needed.     CLOBETASOL PROPIONATE E 0.05 % emollient cream Apply topically 3 (three) times daily.     Dulaglutide (TRULICITY) 1.5 MG/0.5ML SOPN Inject 1.5 mg into the skin once a week. 2 mL 5   EPINEPHrine (EPIPEN 2-PAK) 0.3 mg/0.3 mL IJ SOAJ injection Use as directed for life-threatening allergic reaction. 2 each 1  epoetin alfa-epbx (RETACRIT) 2000 UNIT/ML injection See admin instructions.     escitalopram (LEXAPRO) 20 MG tablet Take 20 mg by mouth daily.     ezetimibe (ZETIA) 10 MG tablet Take 1 tablet (10 mg total) by mouth daily.     fexofenadine (ALLEGRA ALLERGY) 180 MG tablet Take 1 tablet (180 mg total) by mouth daily.     folic acid (FOLVITE) 1 MG tablet Take 2 mg by mouth daily.     hydrALAZINE (APRESOLINE) 50 MG tablet Take 50 mg by mouth 3 (three) times daily.      hydrocortisone cream 1 % Apply 1 Application topically 2 (two) times daily as needed for itching.     inFLIXimab (REMICADE) 100 MG injection Inject into the vein.     insulin aspart (NOVOLOG) 100 UNIT/ML FlexPen Inject into the skin See admin instructions. Sliding Scale     insulin glargine, 2 Unit Dial, (TOUJEO MAX SOLOSTAR) 300 UNIT/ML Solostar Pen Inject 42 Units into the skin at bedtime.     irbesartan (AVAPRO) 300 MG tablet Take 1 tablet (300 mg total) by mouth daily.     KERENDIA 10 MG TABS 10 mg daily.     Lancets Misc. (ACCU-CHEK SOFTCLIX LANCET DEV) KIT See admin instructions.     latanoprost (XALATAN) 0.005 % ophthalmic solution Place 1 drop into both eyes at bedtime.     lidocaine (LMX) 4 % cream Apply 1 Application topically as needed (pain).     Methotrexate Sodium (METHOTREXATE, PF,) 50 MG/2ML injection Inject 20 mg into the muscle every Wednesday. 0.6 ml     metoprolol succinate (TOPROL-XL) 25 MG 24 hr tablet Take 1 tablet (25 mg total) by mouth daily. May take additional half tablet as needed once daily for palpitations. 135 tablet 2   Multiple Vitamins-Minerals (CENTRUM SILVER PO) Take 1 tablet by mouth daily with breakfast.     sulfaSALAzine (AZULFIDINE) 500 MG tablet Take 3 tablets (1,500 mg total) by mouth 2 (two) times daily.     SYNTHROID 125 MCG tablet Take 125 mcg by mouth every Monday, Tuesday, Wednesday, Thursday, and Friday.     triamcinolone cream (KENALOG) 0.1 % Apply 1 Application topically 2 (two) times daily as needed (for itching- affected areas).     vitamin B-12 (CYANOCOBALAMIN) 1000 MCG tablet Take 1,000 mcg by mouth daily.     No current facility-administered medications for this visit.    PHYSICAL EXAMINATION: ECOG PERFORMANCE STATUS: 1 - Symptomatic but completely ambulatory  Vitals:   10/08/23 0927  BP: 137/61  Pulse: 72  Resp: 18  Temp: (!) 97.2 F (36.2 C)  SpO2: 98%   Filed Weights   10/08/23 0927  Weight: 231 lb 9.6 oz (105.1 kg)       LABORATORY DATA:  I have reviewed the data as listed    Latest Ref Rng & Units 09/10/2023    9:22 AM 08/13/2023    1:40 PM 07/17/2023   10:23 AM  CMP  Glucose 70 - 99 mg/dL 161  096  045   BUN 8 - 23 mg/dL 21  32  30   Creatinine 0.44 - 1.00 mg/dL 4.09  8.11  9.14   Sodium 135 - 145 mmol/L 141  141  140   Potassium 3.5 - 5.1 mmol/L 4.7  4.3  4.3   Chloride 98 - 111 mmol/L 110  110  110   CO2 22 - 32 mmol/L 27  27  28    Calcium 8.9 - 10.3 mg/dL 9.3  9.1  8.8   Total Protein 6.5 - 8.1 g/dL 6.3  6.2  6.0   Total Bilirubin <1.2 mg/dL 0.4  0.3  0.3   Alkaline Phos 38 - 126 U/L 77  76  65   AST 15 - 41 U/L 23  22  17    ALT 0 - 44 U/L 18  17  15      Lab Results  Component Value Date   WBC 5.6 10/08/2023   HGB 9.9 (L) 10/08/2023   HCT 33.3 (L) 10/08/2023   MCV 92.0 10/08/2023   PLT 276 10/08/2023   NEUTROABS 3.8 10/08/2023    ASSESSMENT & PLAN:  Anemia in chronic kidney disease Patient has history of hypothyroidism as well as possible adrenal dysfunction issues for which [facility] goes to [facility]   Etiology: 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  (Retacrit injections for hemoglobin 11 or less) Lab review 03/23/2020: Erythropoietin 27.9, hemoglobin 9.6 02/02/2021: Hemoglobin 10.6 (given her severe symptoms continue with Aranesp) 01/02/2022: Hemoglobin 10.7 (no Retacrit today): Last Retacrit was on 12/05/2021  09/19/2022: Hemoglobin 11.2 (Retacrit given 08/31/2022) 12/18/2022: Hemoglobin 10.6 (recommended that she receive Retacrit today) 06/25/2023: Hemoglobin 11.2 (held Retacrit) 07/17/2023: Hemoglobin 10.2 10/08/2023: Hemoglobin 9.9  Patient will continue every 3-week Retacrit injections and I will see her back in 9 weeks.   No orders of the defined types were placed in this encounter.  The patient  has a good understanding of the overall plan. she agrees with it. she will call with any problems that may develop before the next visit here. Total time spent: 30 mins including face to face time and time spent for planning, charting and co-ordination of care   Tamsen Meek, MD 10/08/23

## 2023-10-08 NOTE — Assessment & Plan Note (Signed)
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  (Retacrit injections for hemoglobin 11 or less) Lab review 03/23/2020: Erythropoietin 27.9, hemoglobin 9.6 02/02/2021: Hemoglobin 10.6 (given her severe symptoms continue with Aranesp) 01/02/2022: Hemoglobin 10.7 (no Retacrit today): Last Retacrit was on 12/05/2021  09/19/2022: Hemoglobin 11.2 (Retacrit given 08/31/2022) 12/18/2022: Hemoglobin 10.6 (recommended that she receive Retacrit today) 06/25/2023: Hemoglobin 11.2 (held Retacrit) 07/17/2023: Hemoglobin 10.2 10/08/2023:

## 2023-10-16 DIAGNOSIS — N1831 Chronic kidney disease, stage 3a: Secondary | ICD-10-CM | POA: Diagnosis not present

## 2023-10-17 DIAGNOSIS — M797 Fibromyalgia: Secondary | ICD-10-CM | POA: Diagnosis not present

## 2023-10-17 DIAGNOSIS — M1711 Unilateral primary osteoarthritis, right knee: Secondary | ICD-10-CM | POA: Diagnosis not present

## 2023-10-17 DIAGNOSIS — M0579 Rheumatoid arthritis with rheumatoid factor of multiple sites without organ or systems involvement: Secondary | ICD-10-CM | POA: Diagnosis not present

## 2023-10-17 DIAGNOSIS — M79643 Pain in unspecified hand: Secondary | ICD-10-CM | POA: Diagnosis not present

## 2023-10-17 DIAGNOSIS — Z79899 Other long term (current) drug therapy: Secondary | ICD-10-CM | POA: Diagnosis not present

## 2023-10-17 DIAGNOSIS — M1990 Unspecified osteoarthritis, unspecified site: Secondary | ICD-10-CM | POA: Diagnosis not present

## 2023-10-17 DIAGNOSIS — D649 Anemia, unspecified: Secondary | ICD-10-CM | POA: Diagnosis not present

## 2023-10-17 DIAGNOSIS — M25569 Pain in unspecified knee: Secondary | ICD-10-CM | POA: Diagnosis not present

## 2023-10-17 DIAGNOSIS — E1169 Type 2 diabetes mellitus with other specified complication: Secondary | ICD-10-CM | POA: Diagnosis not present

## 2023-10-18 DIAGNOSIS — M0579 Rheumatoid arthritis with rheumatoid factor of multiple sites without organ or systems involvement: Secondary | ICD-10-CM | POA: Diagnosis not present

## 2023-10-29 ENCOUNTER — Inpatient Hospital Stay: Payer: Medicare PPO

## 2023-10-29 VITALS — BP 146/68 | HR 71 | Temp 98.4°F | Resp 18

## 2023-10-29 DIAGNOSIS — Z7984 Long term (current) use of oral hypoglycemic drugs: Secondary | ICD-10-CM | POA: Diagnosis not present

## 2023-10-29 DIAGNOSIS — N183 Chronic kidney disease, stage 3 unspecified: Secondary | ICD-10-CM

## 2023-10-29 DIAGNOSIS — D631 Anemia in chronic kidney disease: Secondary | ICD-10-CM

## 2023-10-29 DIAGNOSIS — E1122 Type 2 diabetes mellitus with diabetic chronic kidney disease: Secondary | ICD-10-CM | POA: Diagnosis not present

## 2023-10-29 DIAGNOSIS — N1831 Chronic kidney disease, stage 3a: Secondary | ICD-10-CM | POA: Diagnosis not present

## 2023-10-29 DIAGNOSIS — D638 Anemia in other chronic diseases classified elsewhere: Secondary | ICD-10-CM

## 2023-10-29 DIAGNOSIS — D649 Anemia, unspecified: Secondary | ICD-10-CM

## 2023-10-29 LAB — CBC WITH DIFFERENTIAL (CANCER CENTER ONLY)
Abs Immature Granulocytes: 0.01 10*3/uL (ref 0.00–0.07)
Basophils Absolute: 0.1 10*3/uL (ref 0.0–0.1)
Basophils Relative: 1 %
Eosinophils Absolute: 0.2 10*3/uL (ref 0.0–0.5)
Eosinophils Relative: 3 %
HCT: 35.6 % — ABNORMAL LOW (ref 36.0–46.0)
Hemoglobin: 10.7 g/dL — ABNORMAL LOW (ref 12.0–15.0)
Immature Granulocytes: 0 %
Lymphocytes Relative: 29 %
Lymphs Abs: 1.6 10*3/uL (ref 0.7–4.0)
MCH: 26.9 pg (ref 26.0–34.0)
MCHC: 30.1 g/dL (ref 30.0–36.0)
MCV: 89.4 fL (ref 80.0–100.0)
Monocytes Absolute: 0.5 10*3/uL (ref 0.1–1.0)
Monocytes Relative: 9 %
Neutro Abs: 3.3 10*3/uL (ref 1.7–7.7)
Neutrophils Relative %: 58 %
Platelet Count: 187 10*3/uL (ref 150–400)
RBC: 3.98 MIL/uL (ref 3.87–5.11)
RDW: 16.2 % — ABNORMAL HIGH (ref 11.5–15.5)
WBC Count: 5.6 10*3/uL (ref 4.0–10.5)
nRBC: 0 % (ref 0.0–0.2)

## 2023-10-29 MED ORDER — EPOETIN ALFA-EPBX 40000 UNIT/ML IJ SOLN
40000.0000 [IU] | Freq: Once | INTRAMUSCULAR | Status: AC
Start: 2023-10-29 — End: 2023-10-29
  Administered 2023-10-29: 40000 [IU] via SUBCUTANEOUS
  Filled 2023-10-29: qty 1

## 2023-11-05 ENCOUNTER — Other Ambulatory Visit: Payer: Medicare PPO

## 2023-11-05 ENCOUNTER — Ambulatory Visit: Payer: Medicare PPO

## 2023-11-06 ENCOUNTER — Other Ambulatory Visit (HOSPITAL_COMMUNITY): Payer: Self-pay

## 2023-11-06 MED ORDER — TRULICITY 1.5 MG/0.5ML ~~LOC~~ SOAJ
1.5000 mg | SUBCUTANEOUS | 5 refills | Status: DC
  Filled 2023-11-06 – 2024-04-03 (×4): qty 2, 28d supply, fill #0

## 2023-11-06 MED ORDER — TRULICITY 1.5 MG/0.5ML ~~LOC~~ SOAJ
1.5000 mg | SUBCUTANEOUS | 5 refills | Status: DC
  Filled 2023-11-06: qty 6, 84d supply, fill #0
  Filled 2024-04-03: qty 6, 84d supply, fill #1
  Filled 2024-07-02: qty 6, 84d supply, fill #2

## 2023-11-07 ENCOUNTER — Other Ambulatory Visit (HOSPITAL_COMMUNITY): Payer: Self-pay

## 2023-11-07 DIAGNOSIS — M654 Radial styloid tenosynovitis [de Quervain]: Secondary | ICD-10-CM | POA: Diagnosis not present

## 2023-11-13 DIAGNOSIS — E113311 Type 2 diabetes mellitus with moderate nonproliferative diabetic retinopathy with macular edema, right eye: Secondary | ICD-10-CM | POA: Diagnosis not present

## 2023-11-19 ENCOUNTER — Inpatient Hospital Stay: Payer: Medicare PPO

## 2023-11-19 ENCOUNTER — Encounter: Payer: Self-pay | Admitting: Hematology and Oncology

## 2023-11-19 ENCOUNTER — Encounter (HOSPITAL_BASED_OUTPATIENT_CLINIC_OR_DEPARTMENT_OTHER): Payer: Self-pay

## 2023-11-19 ENCOUNTER — Inpatient Hospital Stay: Payer: Medicare PPO | Attending: Hematology and Oncology

## 2023-11-19 DIAGNOSIS — E1122 Type 2 diabetes mellitus with diabetic chronic kidney disease: Secondary | ICD-10-CM | POA: Diagnosis not present

## 2023-11-19 DIAGNOSIS — D631 Anemia in chronic kidney disease: Secondary | ICD-10-CM | POA: Insufficient documentation

## 2023-11-19 DIAGNOSIS — N1831 Chronic kidney disease, stage 3a: Secondary | ICD-10-CM | POA: Diagnosis not present

## 2023-11-19 DIAGNOSIS — N183 Anemia in chronic kidney disease: Secondary | ICD-10-CM

## 2023-11-19 LAB — CBC WITH DIFFERENTIAL (CANCER CENTER ONLY)
Abs Immature Granulocytes: 0.01 10*3/uL (ref 0.00–0.07)
Basophils Absolute: 0.1 10*3/uL (ref 0.0–0.1)
Basophils Relative: 1 %
Eosinophils Absolute: 0.1 10*3/uL (ref 0.0–0.5)
Eosinophils Relative: 2 %
HCT: 41.5 % (ref 36.0–46.0)
Hemoglobin: 12.2 g/dL (ref 12.0–15.0)
Immature Granulocytes: 0 %
Lymphocytes Relative: 26 %
Lymphs Abs: 1.3 10*3/uL (ref 0.7–4.0)
MCH: 25.9 pg — ABNORMAL LOW (ref 26.0–34.0)
MCHC: 29.4 g/dL — ABNORMAL LOW (ref 30.0–36.0)
MCV: 88.1 fL (ref 80.0–100.0)
Monocytes Absolute: 0.4 10*3/uL (ref 0.1–1.0)
Monocytes Relative: 7 %
Neutro Abs: 3.2 10*3/uL (ref 1.7–7.7)
Neutrophils Relative %: 64 %
Platelet Count: 170 10*3/uL (ref 150–400)
RBC: 4.71 MIL/uL (ref 3.87–5.11)
RDW: 15.3 % (ref 11.5–15.5)
WBC Count: 5.1 10*3/uL (ref 4.0–10.5)
nRBC: 0 % (ref 0.0–0.2)

## 2023-11-19 NOTE — Progress Notes (Signed)
Patient here for retacrit inj, Hgb 12.2 today so she does not need inj. Labs printed and given to patient.

## 2023-11-21 DIAGNOSIS — E113392 Type 2 diabetes mellitus with moderate nonproliferative diabetic retinopathy without macular edema, left eye: Secondary | ICD-10-CM | POA: Diagnosis not present

## 2023-11-29 DIAGNOSIS — M0579 Rheumatoid arthritis with rheumatoid factor of multiple sites without organ or systems involvement: Secondary | ICD-10-CM | POA: Diagnosis not present

## 2023-12-02 DIAGNOSIS — L299 Pruritus, unspecified: Secondary | ICD-10-CM | POA: Diagnosis not present

## 2023-12-02 DIAGNOSIS — L6681 Central centrifugal cicatricial alopecia: Secondary | ICD-10-CM | POA: Diagnosis not present

## 2023-12-02 DIAGNOSIS — L658 Other specified nonscarring hair loss: Secondary | ICD-10-CM | POA: Diagnosis not present

## 2023-12-02 NOTE — Progress Notes (Signed)
 Cardiology Office Note:  .   Date:  12/03/2023  ID:  MEKHI LASCOLA, DOB 1956/10/25, MRN [id number] PCP: Clarice Nottingham, MD  Nephrology: Dr. Katheryn Saba [facility] Providers Cardiologist:  Vinie JAYSON Maxcy, MD    History of Present Illness: .   Jocelyn Kaufman is a 68 y.o. 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   Seen 04/2021 doing overall well with excpetion of one episode of chest discomfort that was atypical for angina as occurred at rest and self resolved. It occurred after a fall in her yard and was thought to be musculoskeletal in natrua. No ischemic evaluation recommended.    At last visit 05/07/22 noted exertional dyspnea with subsequent echocardiogram 04/2022 normal LVEF 55-60%, moderate to severe mitral calcification with mild MR. Myoview  07/31/22 low risk study with no ischemia.    Admitted 08/20/22 with PE after quadriceps tendon repair 2 weeks prior. Treated with OAC for 3 months. Echo during admission 07/2022 LVEF 45-50%, LV global hypokinesis, mild MR, aortic sclerosis without stenosis.   Updated echo 02/2023 LVEF 50-55%, moderate LVH, aortic valve sclerosis without stenosis. At follow up 06/22/23 Chlorthalidone  was changed from PRN to 12.5mg  daily.   Presents today for follow up independently. She does take BP at home at times and says it is around 130's/60's. Has a headache and dizziness now. Not an often occurrence. She did not eat breakfast and had a hectic day yesterday which she attributes to her symptoms. Has noticed swelling in her LE but she contributes this to skipping Farxiga  yesterday. She is taking Chlorthalidone  most days but will skip if she has office visits or errands. Last week she had brief chest pain  three days that felt like pressure, and  lasted no more than 2 minutes. Occurred at rest and with activity. Has not happened since.   She went to the dermatologist yesterday and they recommended trialing midoxadil for hair loss and asked for clearance. She is first trying a specific hair foam in a study. Educated to check BP and for swelling if started the midoxadil. Reports no shortness of breath nor dyspnea on exertion. No edema, orthopnea, PND. Reports no palpitations.   ROS: Please see the history of present illness.    All other systems reviewed and are negative.   Studies Reviewed: .           Risk Assessment/Calculations:            Physical Exam:   VS:  BP (!) 160/74 (Cuff Size: Large)   Pulse 73   Ht 5' 4.5 (1.638 m)   Wt 225 lb 9.6 oz (102.3 kg)   SpO2 93%   BMI 38.13 kg/m    Wt Readings from Last 3 Encounters:  12/03/23 225 lb 9.6 oz (102.3 kg)  10/08/23 231 lb 9.6 oz (105.1 kg)  09/17/23 231 lb (104.8 kg)    GEN: Well nourished, well developed in no acute distress NECK: No JVD; No carotid bruits CARDIAC: RRR, no murmurs, rubs, gallops RESPIRATORY:  Clear to auscultation without rales, wheezing or rhonchi  ABDOMEN: Soft, non-tender, non-distended EXTREMITIES:  Minimal pretibial edema; No deformity   ASSESSMENT AND PLAN: .    Diastolic heart failure - Echo 89/7976 LVEF 45-50% in setting of PE. Echo 02/2023 LVEF 50-55%, grIII diastolic dysfunction. Low sodium diet, fluid restriction <  2 L, and daily weights encouraged. Educated to contact our office for weight gain of 2 lb overnight or 5 lb in one week. Weight today is down 8 lb from previous visit 6 months ago.  Previously did not tolerate Jardiance due to yeast infection.   Continue on GDMT- Metoprolol  25 mg, Irbesartan  300 mg, Chlorthalidone  12.5 mg (encouraged to take daily for BP control, takes intermittently if she has appointments), and Farxiga  5 mg.   NSVT / PVC - Well controlled on Toprol  25mg  QD. She may take additional half tablet as needed for  breakthrough palpitations.    DM2 - 09/24 A1C 6.4. Continue to follow with PCP. Appreciate SGLT2i and GLP 1 for cardioprotective benefit.    Coronary artery calcification on CT / HLD - Inadvertent finding. Myoview  07/2022 low risk. Experienced atypical CP last week for 3 days. Will continue to monitor, no indication for ischemic evaluation.If symptoms progress, she will contact the office. Continue on GDMT- aspirin  81 mg, atorvastatin  40 mg.   HTN - BP today 160/74. Patient has a headache and has not taken her medications today. Checks BP at home sporadically. Educated on checking BP at least three time per week. Will check in via MyChart in 2 weeks to check on BP readings. If BP not controlled at home, consider increasing dose Hydralazine .   OSA - CPAP compliance encourage. She is wearing regularly.   CKD - On Leonore per nephrology team Dr. Jerrye.   Anemia of chronic disease - Follows with hematology.    Obesity - On Trulicity  1.5 mg. Managed by endocrinology. Completed PREP program.        Dispo: follow up 4-6 months.   Signed, Reche GORMAN Finder, NP

## 2023-12-03 ENCOUNTER — Ambulatory Visit (HOSPITAL_BASED_OUTPATIENT_CLINIC_OR_DEPARTMENT_OTHER): Payer: Medicare PPO | Admitting: Family

## 2023-12-03 ENCOUNTER — Encounter (HOSPITAL_BASED_OUTPATIENT_CLINIC_OR_DEPARTMENT_OTHER): Payer: Self-pay | Admitting: Family

## 2023-12-03 ENCOUNTER — Encounter (HOSPITAL_BASED_OUTPATIENT_CLINIC_OR_DEPARTMENT_OTHER): Payer: Self-pay

## 2023-12-03 VITALS — BP 160/74 | HR 73 | Ht 64.5 in | Wt 225.6 lb

## 2023-12-03 DIAGNOSIS — I10 Essential (primary) hypertension: Secondary | ICD-10-CM | POA: Diagnosis not present

## 2023-12-03 DIAGNOSIS — I5032 Chronic diastolic (congestive) heart failure: Secondary | ICD-10-CM

## 2023-12-03 DIAGNOSIS — I25118 Atherosclerotic heart disease of native coronary artery with other forms of angina pectoris: Secondary | ICD-10-CM | POA: Diagnosis not present

## 2023-12-03 DIAGNOSIS — I4729 Other ventricular tachycardia: Secondary | ICD-10-CM

## 2023-12-03 DIAGNOSIS — E785 Hyperlipidemia, unspecified: Secondary | ICD-10-CM | POA: Diagnosis not present

## 2023-12-03 DIAGNOSIS — G4733 Obstructive sleep apnea (adult) (pediatric): Secondary | ICD-10-CM | POA: Diagnosis not present

## 2023-12-03 NOTE — Patient Instructions (Signed)
Medication Instructions:  Your physician recommends that you continue on your current medications as directed. Please refer to the Current Medication list given to you today.  *If you need a refill on your cardiac medications before your next appointment, please call your pharmacy*  Follow-Up: At [facility], you and your health needs are our priority.  As part of our continuing mission to provide you with exceptional heart care, we have created designated Provider Care Teams.  These Care Teams include your primary Cardiologist (physician) and Advanced Practice Providers (APPs -  Physician Assistants and Nurse Practitioners) who all work together to provide you with the care you need, when you need it.  We recommend signing up for the patient portal called "MyChart".  Sign up information is provided on this After Visit Summary.  MyChart is used to connect with patients for Virtual Visits (Telemedicine).  Patients are able to view lab/test results, encounter notes, upcoming appointments, etc.  Non-urgent messages can be sent to your provider as well.   To learn more about what you can do with MyChart, go to [email or web address].    Your next appointment:   4 to 6 month(s)  Provider:   Gillian Shields, NP    Other Instructions   We will send a MyChart message in about a week to check on your blood pressures.

## 2023-12-10 ENCOUNTER — Inpatient Hospital Stay: Payer: Medicare PPO

## 2023-12-10 ENCOUNTER — Ambulatory Visit: Payer: Medicare PPO | Admitting: Podiatry

## 2023-12-10 ENCOUNTER — Inpatient Hospital Stay: Payer: Medicare PPO | Admitting: Hematology and Oncology

## 2023-12-10 ENCOUNTER — Inpatient Hospital Stay: Payer: Medicare PPO | Attending: Hematology and Oncology

## 2023-12-10 VITALS — BP 130/58 | HR 78 | Temp 97.3°F | Resp 16 | Ht 64.5 in | Wt 220.5 lb

## 2023-12-10 DIAGNOSIS — E039 Hypothyroidism, unspecified: Secondary | ICD-10-CM | POA: Diagnosis not present

## 2023-12-10 DIAGNOSIS — D649 Anemia, unspecified: Secondary | ICD-10-CM

## 2023-12-10 DIAGNOSIS — E1122 Type 2 diabetes mellitus with diabetic chronic kidney disease: Secondary | ICD-10-CM | POA: Insufficient documentation

## 2023-12-10 DIAGNOSIS — D631 Anemia in chronic kidney disease: Secondary | ICD-10-CM | POA: Insufficient documentation

## 2023-12-10 DIAGNOSIS — N1831 Chronic kidney disease, stage 3a: Secondary | ICD-10-CM | POA: Insufficient documentation

## 2023-12-10 LAB — CBC WITH DIFFERENTIAL (CANCER CENTER ONLY)
Abs Immature Granulocytes: 0.01 10*3/uL (ref 0.00–0.07)
Basophils Absolute: 0 10*3/uL (ref 0.0–0.1)
Basophils Relative: 1 %
Eosinophils Absolute: 0.1 10*3/uL (ref 0.0–0.5)
Eosinophils Relative: 2 %
HCT: 40.3 % (ref 36.0–46.0)
Hemoglobin: 12.1 g/dL (ref 12.0–15.0)
Immature Granulocytes: 0 %
Lymphocytes Relative: 32 %
Lymphs Abs: 1.4 10*3/uL (ref 0.7–4.0)
MCH: 26.4 pg (ref 26.0–34.0)
MCHC: 30 g/dL (ref 30.0–36.0)
MCV: 88 fL (ref 80.0–100.0)
Monocytes Absolute: 0.9 10*3/uL (ref 0.1–1.0)
Monocytes Relative: 21 %
Neutro Abs: 2 10*3/uL (ref 1.7–7.7)
Neutrophils Relative %: 44 %
Platelet Count: 130 10*3/uL — ABNORMAL LOW (ref 150–400)
RBC: 4.58 MIL/uL (ref 3.87–5.11)
RDW: 15.2 % (ref 11.5–15.5)
WBC Count: 4.5 10*3/uL (ref 4.0–10.5)
nRBC: 0 % (ref 0.0–0.2)

## 2023-12-10 NOTE — Progress Notes (Signed)
Patient Care Team: Merri Brunette, MD as PCP - General (Internal Medicine) Rennis Golden Lisette Abu, MD as PCP - Cardiology (Cardiology) Serena Croissant, MD as Consulting Physician (Hematology and Oncology) Casimer Lanius, MD as Consulting Physician (Rheumatology) Marlene Lard, MD as Consulting Physician (Endocrinology) Janet Berlin, MD as Consulting Physician (Ophthalmology) Estanislado Emms, MD as Consulting Physician (Nephrology)  DIAGNOSIS:  Encounter Diagnosis  Name Primary?   Normochromic normocytic anemia Yes      CHIEF COMPLIANT: Follow-up of anemia  HISTORY OF PRESENT ILLNESS:   History of Present Illness   Jocelyn Kaufman is a 68 year old female who presents for anemia follow-up after discontinuation of methotrexate injections.  She has experienced significant improvement in her hemoglobin levels, with a recent measurement of 12.1, compared to 4.6 three weeks ago. This improvement follows the discontinuation of methotrexate injections, which she stopped on October 29, 2023.  She has been managing well without methotrexate injections and has not required any further doses since the last one in December. She is currently not experiencing any significant symptoms that would necessitate the continuation of methotrexate.  She is recovering from a cold but has no symptoms of influenza or RSV.         ALLERGIES:  is allergic to semaglutide(0.25 or 0.5mg -dos), shellfish-derived products, empagliflozin, semaglutide, tramadol hcl, canagliflozin, latex, liraglutide, minoxidil, omeprazole, and percocet [oxycodone-acetaminophen].  MEDICATIONS:  Current Outpatient Medications  Medication Sig Dispense Refill   ACCU-CHEK GUIDE test strip      Accu-Chek Softclix Lancets lancets use to check blood sugar     Aflibercept (EYLEA) 2 MG/0.05ML SOLN 2 mg by Intravitreal route every 3 (three) months.     albuterol (VENTOLIN HFA) 108 (90 Base) MCG/ACT inhaler Inhale two puffs every four to six  hours as needed for cough or wheeze. (Patient taking differently: Inhale 1-2 puffs into the lungs every 6 (six) hours as needed for wheezing.) 18 g 2   amLODipine (NORVASC) 10 MG tablet Take 1 tablet (10 mg total) by mouth daily.     aspirin EC 81 MG tablet Take 81 mg by mouth daily. Swallow whole.     atorvastatin (LIPITOR) 40 MG tablet Take 1 tablet (40 mg total) by mouth daily at 6 PM. (Patient taking differently: Take 40 mg by mouth daily.) 30 tablet 0   Azelaic Acid (FINACEA) 15 % FOAM Apply topically. Apply to scalp twice daily     B-D ULTRAFINE III SHORT PEN 31G X 8 MM MISC SMARTSIG:1 Each SUB-Q Daily     CHELATED IRON PO Take 18 mg by mouth daily.     chlorthalidone (HYGROTON) 25 MG tablet Take 0.5 tablets (12.5 mg total) by mouth daily. 45 tablet 3   Cholecalciferol (VITAMIN D3) 125 MCG (5000 UT) CAPS Take 5,000 Units by mouth daily.     clindamycin (CLEOCIN T) 1 % external solution Apply to underarm daily to twice as needed.     CLOBETASOL PROPIONATE E 0.05 % emollient cream Apply topically 3 (three) times daily.     dapagliflozin propanediol (FARXIGA) 5 MG TABS tablet Take 1 tablet (5 mg total) by mouth daily before breakfast.     Dulaglutide (TRULICITY) 1.5 MG/0.5ML SOAJ Inject 1.5 mg into the skin once a week. 2 mL 5   EPINEPHrine (EPIPEN 2-PAK) 0.3 mg/0.3 mL IJ SOAJ injection Use as directed for life-threatening allergic reaction. 2 each 1   epoetin alfa-epbx (RETACRIT) 2000 UNIT/ML injection See admin instructions.     escitalopram (LEXAPRO) 20 MG tablet  Take 20 mg by mouth daily.     ezetimibe (ZETIA) 10 MG tablet Take 1 tablet (10 mg total) by mouth daily.     fexofenadine (ALLEGRA ALLERGY) 180 MG tablet Take 1 tablet (180 mg total) by mouth daily.     Finerenone (KERENDIA) 20 MG TABS Take 10 mg by mouth daily.     folic acid (FOLVITE) 1 MG tablet Take 2 mg by mouth daily.     hydrALAZINE (APRESOLINE) 50 MG tablet Take 50 mg by mouth 3 (three) times daily.     hydrocortisone  cream 1 % Apply 1 Application topically 2 (two) times daily as needed for itching.     inFLIXimab (REMICADE) 100 MG injection Inject into the vein.     insulin aspart (NOVOLOG) 100 UNIT/ML FlexPen Inject into the skin See admin instructions. Sliding Scale     insulin glargine, 2 Unit Dial, (TOUJEO MAX SOLOSTAR) 300 UNIT/ML Solostar Pen Inject 42 Units into the skin at bedtime.     irbesartan (AVAPRO) 300 MG tablet Take 1 tablet (300 mg total) by mouth daily.     KERENDIA 10 MG TABS 10 mg daily.     Lancets Misc. (ACCU-CHEK SOFTCLIX LANCET DEV) KIT See admin instructions.     latanoprost (XALATAN) 0.005 % ophthalmic solution Place 1 drop into both eyes at bedtime.     leflunomide (ARAVA) 20 MG tablet Take 20 mg by mouth daily.     lidocaine (LMX) 4 % cream Apply 1 Application topically as needed (pain).     metoprolol succinate (TOPROL-XL) 25 MG 24 hr tablet Take 1 tablet (25 mg total) by mouth daily. May take additional half tablet as needed once daily for palpitations. 135 tablet 2   Multiple Vitamins-Minerals (CENTRUM SILVER PO) Take 1 tablet by mouth daily with breakfast.     sulfaSALAzine (AZULFIDINE) 500 MG tablet Take 3 tablets (1,500 mg total) by mouth 2 (two) times daily.     SYNTHROID 125 MCG tablet Take 125 mcg by mouth every Monday, Tuesday, Wednesday, Thursday, and Friday.     triamcinolone cream (KENALOG) 0.1 % Apply 1 Application topically 2 (two) times daily as needed (for itching- affected areas).     vitamin B-12 (CYANOCOBALAMIN) 1000 MCG tablet Take 1,000 mcg by mouth daily.     No current facility-administered medications for this visit.    PHYSICAL EXAMINATION: ECOG PERFORMANCE STATUS: 1 - Symptomatic but completely ambulatory  Vitals:   12/10/23 1341  BP: (!) 130/58  Pulse: 78  Resp: 16  Temp: (!) 97.3 F (36.3 C)  SpO2: 98%   Filed Weights   12/10/23 1341  Weight: 220 lb 8 oz (100 kg)      LABORATORY DATA:  I have reviewed the data as listed    Latest  Ref Rng & Units 09/10/2023    9:22 AM 08/13/2023    1:40 PM 07/17/2023   10:23 AM  CMP  Glucose 70 - 99 mg/dL 440  347  425   BUN 8 - 23 mg/dL 21  32  30   Creatinine 0.44 - 1.00 mg/dL 9.56  3.87  5.64   Sodium 135 - 145 mmol/L 141  141  140   Potassium 3.5 - 5.1 mmol/L 4.7  4.3  4.3   Chloride 98 - 111 mmol/L 110  110  110   CO2 22 - 32 mmol/L 27  27  28    Calcium 8.9 - 10.3 mg/dL 9.3  9.1  8.8   Total Protein  6.5 - 8.1 g/dL 6.3  6.2  6.0   Total Bilirubin <1.2 mg/dL 0.4  0.3  0.3   Alkaline Phos 38 - 126 U/L 77  76  65   AST 15 - 41 U/L 23  22  17    ALT 0 - 44 U/L 18  17  15      Lab Results  Component Value Date   WBC 4.5 12/10/2023   HGB 12.1 12/10/2023   HCT 40.3 12/10/2023   MCV 88.0 12/10/2023   PLT 130 (L) 12/10/2023   NEUTROABS 2.0 12/10/2023    ASSESSMENT & PLAN:  Normochromic normocytic anemia Patient has history of hypothyroidism as well as possible adrenal dysfunction issues for which [facility] goes to [facility]    Etiology: 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  (Retacrit injections for hemoglobin 11 or less) Lab review 03/23/2020: Erythropoietin 27.9, hemoglobin 9.6 02/02/2021: Hemoglobin 10.6 (given her severe symptoms continue with Aranesp) 01/02/2022: Hemoglobin 10.7 (no Retacrit today): Last Retacrit was on 12/05/2021  09/19/2022: Hemoglobin 11.2 (Retacrit given 08/31/2022) 12/18/2022: Hemoglobin 10.6 (recommended that she receive Retacrit today) 06/25/2023: Hemoglobin 11.2 (held Retacrit) 07/17/2023: Hemoglobin 10.2 10/08/2023: Hemoglobin 9.9 12/10/2023: Hemoglobin 12.1 (methotrexate was discontinued in December 2024)   Patient does not need injections any further.  Since methotrexate was discontinued her anemia has improved. Return to clinic in 2 months with labs and follow-up.     No orders of the defined types were placed in this encounter.  The patient has a good understanding of the overall plan. she agrees with it. she will call with any problems that may develop before the next visit here. Total time spent: 30 mins including face to face time and time spent for planning, charting and co-ordination of care   Tamsen Meek, MD 12/10/23

## 2023-12-10 NOTE — Assessment & Plan Note (Signed)
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  (Retacrit injections for hemoglobin 11 or less) Lab review 03/23/2020: Erythropoietin 27.9, hemoglobin 9.6 02/02/2021: Hemoglobin 10.6 (given her severe symptoms continue with Aranesp) 01/02/2022: Hemoglobin 10.7 (no Retacrit today): Last Retacrit was on 12/05/2021  09/19/2022: Hemoglobin 11.2 (Retacrit given 08/31/2022) 12/18/2022: Hemoglobin 10.6 (recommended that she receive Retacrit today) 06/25/2023: Hemoglobin 11.2 (held Retacrit) 07/17/2023: Hemoglobin 10.2 10/08/2023: Hemoglobin 9.9 12/10/2023:   Patient will continue every 3-week Retacrit injections and I will see her back in 9 weeks.

## 2023-12-24 DIAGNOSIS — N1831 Chronic kidney disease, stage 3a: Secondary | ICD-10-CM | POA: Diagnosis not present

## 2023-12-27 ENCOUNTER — Telehealth: Payer: Self-pay | Admitting: Hematology and Oncology

## 2023-12-27 NOTE — Telephone Encounter (Signed)
 Canceled appointments per 2/11 los. Patient is aware of the changes made to her upcoming appointments.

## 2023-12-31 ENCOUNTER — Ambulatory Visit: Payer: Medicare PPO

## 2023-12-31 ENCOUNTER — Ambulatory Visit (INDEPENDENT_AMBULATORY_CARE_PROVIDER_SITE_OTHER): Payer: Medicare PPO | Admitting: Podiatry

## 2023-12-31 ENCOUNTER — Encounter: Payer: Self-pay | Admitting: Podiatry

## 2023-12-31 ENCOUNTER — Inpatient Hospital Stay: Payer: Medicare PPO | Attending: Hematology and Oncology

## 2023-12-31 VITALS — Ht 64.5 in | Wt 220.0 lb

## 2023-12-31 DIAGNOSIS — B351 Tinea unguium: Secondary | ICD-10-CM

## 2023-12-31 DIAGNOSIS — L84 Corns and callosities: Secondary | ICD-10-CM

## 2023-12-31 DIAGNOSIS — M79674 Pain in right toe(s): Secondary | ICD-10-CM

## 2023-12-31 DIAGNOSIS — E1142 Type 2 diabetes mellitus with diabetic polyneuropathy: Secondary | ICD-10-CM

## 2023-12-31 DIAGNOSIS — M79675 Pain in left toe(s): Secondary | ICD-10-CM

## 2024-01-01 DIAGNOSIS — I129 Hypertensive chronic kidney disease with stage 1 through stage 4 chronic kidney disease, or unspecified chronic kidney disease: Secondary | ICD-10-CM | POA: Diagnosis not present

## 2024-01-01 DIAGNOSIS — E1122 Type 2 diabetes mellitus with diabetic chronic kidney disease: Secondary | ICD-10-CM | POA: Diagnosis not present

## 2024-01-01 DIAGNOSIS — N1831 Chronic kidney disease, stage 3a: Secondary | ICD-10-CM | POA: Diagnosis not present

## 2024-01-01 DIAGNOSIS — R809 Proteinuria, unspecified: Secondary | ICD-10-CM | POA: Diagnosis not present

## 2024-01-05 NOTE — Progress Notes (Signed)
 Subjective:  Patient ID: Jocelyn Kaufman, female    DOB: Aug 05, 1956,  MRN: [id number]  Jocelyn Kaufman presents to clinic today for at risk foot care with history of diabetic neuropathy and corn(s) right foot and painful mycotic toenails that are difficult to trim. Painful toenails interfere with ambulation. Aggravating factors include wearing enclosed shoe gear. Pain is relieved with periodic professional debridement. Painful corns are aggravated when weightbearing when wearing enclosed shoe gear. Pain is relieved with periodic professional debridement.  Chief Complaint  Patient presents with   DFc    She is here for a diabetic nail trim, "Last A1C was 5.8 or 6.2", Pcp is Dr Renne Crigler,    New problem(s): None.   PCP is Merri Brunette, MD. Theron Arista 68/07/2023.  Allergies  Allergen Reactions   Semaglutide(0.25 Or 0.5mg -Dos) Other (See Comments)    Causes cataracts requiring surgery and impairing vision    Shellfish-Derived Products Anaphylaxis, Swelling and Other (See Comments)    Patient avoids   Empagliflozin Itching, Other (See Comments) and Swelling    UTI sx's and yeast infections   Semaglutide Other (See Comments)    Other reaction(s): worsening DM RETINOPATHY   Tramadol Hcl Other (See Comments)    hallucinations   Canagliflozin Other (See Comments)    Urinary incontinence    Latex Rash and Other (See Comments)   Liraglutide Itching    Other reaction(s): itching all over   Minoxidil Rash    skin irritation   Omeprazole Rash and Other (See Comments)   Percocet [Oxycodone-Acetaminophen] Nausea And Vomiting    Review of Systems: Negative except as noted in the HPI.  Objective: No changes noted in today's physical examination. There were no vitals filed for this visit. MAHAILA Kaufman is a pleasant 68 y.o. female in NAD. AAO x 3.  Vascular Examination: Capillary refill time immediate b/l. Vascular status intact b/l with palpable pedal pulses. Pedal hair present b/l. No pain with calf  compression b/l. Skin temperature gradient WNL b/l. No cyanosis or clubbing b/l. No ischemia or gangrene noted b/l. No edema.  Neurological Examination: Pt has subjective symptoms of neuropathy. Protective sensation intact 5/5 intact bilaterally with 10g monofilament b/l.  Dermatological Examination: Pedal skin with normal turgor, texture and tone b/l.  No open wounds. No interdigital macerations.   Toenails 1-5 b/l thick, discolored, elongated with subungual debris and pain on dorsal palpation.   Hyperkeratotic lesion(s) right fifth digit.  No erythema, no edema, no drainage, no fluctuance.  Musculoskeletal Examination: Normal muscle strength 5/5 to all lower extremity muscle groups bilaterally. 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 No pain, crepitus or joint limitation noted with ROM b/l LE.  Patient ambulates independently without assistive aids.  Radiographs: None  Assessment/Plan: 1. Pain due to onychomycosis of toenails of both feet   2. Corns   3. Diabetic peripheral neuropathy associated with type 2 diabetes mellitus (HCC)    Patient was evaluated and treated. All patient's and/or POA's questions/concerns addressed on today's visit. Toenails 1-5 debrided in length and girth without incident. Corn(s) R 5th toe pared with sharp debridement without incident. Continue foot and shoe inspections daily. Monitor blood glucose per PCP/Endocrinologist's recommendations. Continue soft, supportive shoe gear daily. Report any pedal injuries to medical professional. Call office if there are any questions/concerns. -Patient/POA to call should there be question/concern in the interim.   Return in about 3 months (around 04/01/2024).  Freddie Breech, DPM      [place] LOCATION: [address]  [place], [place] [address]                   Office [phone]   [facility] LOCATION: [address] [place], [place] [address] Office  [phone]

## 2024-01-06 NOTE — Telephone Encounter (Signed)
 FYI

## 2024-01-09 MED ORDER — METOPROLOL SUCCINATE ER 25 MG PO TB24: 37.5000 mg | ORAL_TABLET | Freq: Every day | ORAL | Status: DC

## 2024-01-09 NOTE — Telephone Encounter (Signed)
 Pt following back up

## 2024-01-09 NOTE — Telephone Encounter (Signed)
 Increase Toprol to 37.5mg  daily (1.5 tablets) and follow up via MyChart in 2 weeks.   Alver Sorrow, NP

## 2024-01-10 DIAGNOSIS — E1121 Type 2 diabetes mellitus with diabetic nephropathy: Secondary | ICD-10-CM | POA: Diagnosis not present

## 2024-01-10 DIAGNOSIS — E039 Hypothyroidism, unspecified: Secondary | ICD-10-CM | POA: Diagnosis not present

## 2024-01-10 DIAGNOSIS — M0579 Rheumatoid arthritis with rheumatoid factor of multiple sites without organ or systems involvement: Secondary | ICD-10-CM | POA: Diagnosis not present

## 2024-01-13 DIAGNOSIS — E113392 Type 2 diabetes mellitus with moderate nonproliferative diabetic retinopathy without macular edema, left eye: Secondary | ICD-10-CM | POA: Diagnosis not present

## 2024-01-13 DIAGNOSIS — E113311 Type 2 diabetes mellitus with moderate nonproliferative diabetic retinopathy with macular edema, right eye: Secondary | ICD-10-CM | POA: Diagnosis not present

## 2024-01-13 DIAGNOSIS — H59813 Chorioretinal scars after surgery for detachment, bilateral: Secondary | ICD-10-CM | POA: Diagnosis not present

## 2024-01-13 DIAGNOSIS — H3582 Retinal ischemia: Secondary | ICD-10-CM | POA: Diagnosis not present

## 2024-01-15 DIAGNOSIS — M1990 Unspecified osteoarthritis, unspecified site: Secondary | ICD-10-CM | POA: Diagnosis not present

## 2024-01-15 DIAGNOSIS — M1711 Unilateral primary osteoarthritis, right knee: Secondary | ICD-10-CM | POA: Diagnosis not present

## 2024-01-15 DIAGNOSIS — M79643 Pain in unspecified hand: Secondary | ICD-10-CM | POA: Diagnosis not present

## 2024-01-15 DIAGNOSIS — Z79899 Other long term (current) drug therapy: Secondary | ICD-10-CM | POA: Diagnosis not present

## 2024-01-15 DIAGNOSIS — M797 Fibromyalgia: Secondary | ICD-10-CM | POA: Diagnosis not present

## 2024-01-15 DIAGNOSIS — M0579 Rheumatoid arthritis with rheumatoid factor of multiple sites without organ or systems involvement: Secondary | ICD-10-CM | POA: Diagnosis not present

## 2024-01-15 DIAGNOSIS — M25569 Pain in unspecified knee: Secondary | ICD-10-CM | POA: Diagnosis not present

## 2024-01-15 DIAGNOSIS — D649 Anemia, unspecified: Secondary | ICD-10-CM | POA: Diagnosis not present

## 2024-01-16 DIAGNOSIS — E785 Hyperlipidemia, unspecified: Secondary | ICD-10-CM | POA: Diagnosis not present

## 2024-01-16 DIAGNOSIS — M0579 Rheumatoid arthritis with rheumatoid factor of multiple sites without organ or systems involvement: Secondary | ICD-10-CM | POA: Diagnosis not present

## 2024-01-16 DIAGNOSIS — N1832 Chronic kidney disease, stage 3b: Secondary | ICD-10-CM | POA: Diagnosis not present

## 2024-01-16 DIAGNOSIS — M069 Rheumatoid arthritis, unspecified: Secondary | ICD-10-CM | POA: Diagnosis not present

## 2024-01-16 DIAGNOSIS — I10 Essential (primary) hypertension: Secondary | ICD-10-CM | POA: Diagnosis not present

## 2024-01-16 DIAGNOSIS — E039 Hypothyroidism, unspecified: Secondary | ICD-10-CM | POA: Diagnosis not present

## 2024-01-16 DIAGNOSIS — E1121 Type 2 diabetes mellitus with diabetic nephropathy: Secondary | ICD-10-CM | POA: Diagnosis not present

## 2024-01-16 DIAGNOSIS — E1122 Type 2 diabetes mellitus with diabetic chronic kidney disease: Secondary | ICD-10-CM | POA: Diagnosis not present

## 2024-01-21 DIAGNOSIS — M25562 Pain in left knee: Secondary | ICD-10-CM | POA: Diagnosis not present

## 2024-01-23 ENCOUNTER — Encounter (HOSPITAL_BASED_OUTPATIENT_CLINIC_OR_DEPARTMENT_OTHER): Payer: Self-pay

## 2024-01-23 NOTE — Telephone Encounter (Signed)
 Follow up.

## 2024-01-23 NOTE — Telephone Encounter (Signed)
Bp log as requested 

## 2024-02-03 DIAGNOSIS — E1121 Type 2 diabetes mellitus with diabetic nephropathy: Secondary | ICD-10-CM | POA: Diagnosis not present

## 2024-02-03 DIAGNOSIS — Z0000 Encounter for general adult medical examination without abnormal findings: Secondary | ICD-10-CM | POA: Diagnosis not present

## 2024-02-03 DIAGNOSIS — N393 Stress incontinence (female) (male): Secondary | ICD-10-CM | POA: Diagnosis not present

## 2024-02-03 DIAGNOSIS — Z23 Encounter for immunization: Secondary | ICD-10-CM | POA: Diagnosis not present

## 2024-02-03 DIAGNOSIS — M069 Rheumatoid arthritis, unspecified: Secondary | ICD-10-CM | POA: Diagnosis not present

## 2024-02-03 DIAGNOSIS — M5459 Other low back pain: Secondary | ICD-10-CM | POA: Diagnosis not present

## 2024-02-03 DIAGNOSIS — K5909 Other constipation: Secondary | ICD-10-CM | POA: Diagnosis not present

## 2024-02-03 DIAGNOSIS — E039 Hypothyroidism, unspecified: Secondary | ICD-10-CM | POA: Diagnosis not present

## 2024-02-03 DIAGNOSIS — I10 Essential (primary) hypertension: Secondary | ICD-10-CM | POA: Diagnosis not present

## 2024-02-04 ENCOUNTER — Inpatient Hospital Stay: Payer: Medicare PPO

## 2024-02-04 ENCOUNTER — Inpatient Hospital Stay: Payer: Medicare PPO | Attending: Hematology and Oncology

## 2024-02-04 ENCOUNTER — Inpatient Hospital Stay: Payer: Medicare PPO | Admitting: Hematology and Oncology

## 2024-02-04 VITALS — BP 135/56 | HR 66 | Temp 97.5°F | Resp 18 | Ht 64.5 in | Wt 223.1 lb

## 2024-02-04 DIAGNOSIS — E1122 Type 2 diabetes mellitus with diabetic chronic kidney disease: Secondary | ICD-10-CM | POA: Diagnosis not present

## 2024-02-04 DIAGNOSIS — D631 Anemia in chronic kidney disease: Secondary | ICD-10-CM | POA: Diagnosis not present

## 2024-02-04 DIAGNOSIS — D649 Anemia, unspecified: Secondary | ICD-10-CM

## 2024-02-04 DIAGNOSIS — Z7984 Long term (current) use of oral hypoglycemic drugs: Secondary | ICD-10-CM | POA: Insufficient documentation

## 2024-02-04 DIAGNOSIS — N1831 Chronic kidney disease, stage 3a: Secondary | ICD-10-CM | POA: Insufficient documentation

## 2024-02-04 DIAGNOSIS — D638 Anemia in other chronic diseases classified elsewhere: Secondary | ICD-10-CM

## 2024-02-04 LAB — CBC WITH DIFFERENTIAL (CANCER CENTER ONLY)
Abs Immature Granulocytes: 0.01 10*3/uL (ref 0.00–0.07)
Basophils Absolute: 0.1 10*3/uL (ref 0.0–0.1)
Basophils Relative: 1 %
Eosinophils Absolute: 0.1 10*3/uL (ref 0.0–0.5)
Eosinophils Relative: 2 %
HCT: 34.2 % — ABNORMAL LOW (ref 36.0–46.0)
Hemoglobin: 10.3 g/dL — ABNORMAL LOW (ref 12.0–15.0)
Immature Granulocytes: 0 %
Lymphocytes Relative: 19 %
Lymphs Abs: 1.4 10*3/uL (ref 0.7–4.0)
MCH: 26.5 pg (ref 26.0–34.0)
MCHC: 30.1 g/dL (ref 30.0–36.0)
MCV: 87.9 fL (ref 80.0–100.0)
Monocytes Absolute: 0.5 10*3/uL (ref 0.1–1.0)
Monocytes Relative: 7 %
Neutro Abs: 5.1 10*3/uL (ref 1.7–7.7)
Neutrophils Relative %: 71 %
Platelet Count: 156 10*3/uL (ref 150–400)
RBC: 3.89 MIL/uL (ref 3.87–5.11)
RDW: 15.6 % — ABNORMAL HIGH (ref 11.5–15.5)
WBC Count: 7.2 10*3/uL (ref 4.0–10.5)
nRBC: 0 % (ref 0.0–0.2)

## 2024-02-04 MED ORDER — EPOETIN ALFA-EPBX 40000 UNIT/ML IJ SOLN
40000.0000 [IU] | Freq: Once | INTRAMUSCULAR | Status: AC
  Administered 2024-02-04: 40000 [IU] via SUBCUTANEOUS
  Filled 2024-02-04: qty 1

## 2024-02-04 NOTE — Progress Notes (Signed)
 Patient Care Team: Merri Brunette, MD as PCP - General (Internal Medicine) Rennis Golden Lisette Abu, MD as PCP - Cardiology (Cardiology) Serena Croissant, MD as Consulting Physician (Hematology and Oncology) Casimer Lanius, MD as Consulting Physician (Rheumatology) Marlene Lard, MD as Consulting Physician (Endocrinology) Janet Berlin, MD as Consulting Physician (Ophthalmology) Estanislado Emms, MD as Consulting Physician (Nephrology)  DIAGNOSIS:  Encounter Diagnosis  Name Primary?   Normochromic normocytic anemia Yes   CHIEF COMPLIANT: Follow-up of normocytic anemia  HISTORY OF PRESENT ILLNESS:   History of Present Illness The patient, with a history of methotrexate use, presents with increased fatigue and a significant drop in hemoglobin levels from 12 to 10.3 over the past two months. She has not restarted her methotrexate. She has been taking iron supplements daily, but there is no evidence of iron deficiency. She was previously on folic acid, which was stopped when she discontinued methotrexate. The patient's fatigue and anemia have not improved despite the iron supplementation.     ALLERGIES:  is allergic to semaglutide(0.25 or 0.5mg -dos), shellfish-derived products, empagliflozin, semaglutide, tramadol hcl, canagliflozin, latex, liraglutide, minoxidil, omeprazole, and percocet [oxycodone-acetaminophen].  MEDICATIONS:  Current Outpatient Medications  Medication Sig Dispense Refill   ACCU-CHEK GUIDE test strip      Accu-Chek Softclix Lancets lancets use to check blood sugar     Aflibercept (EYLEA) 2 MG/0.05ML SOLN 2 mg by Intravitreal route every 3 (three) months.     albuterol (VENTOLIN HFA) 108 (90 Base) MCG/ACT inhaler Inhale two puffs every four to six hours as needed for cough or wheeze. (Patient taking differently: Inhale 1-2 puffs into the lungs every 6 (six) hours as needed for wheezing.) 18 g 2   amLODipine (NORVASC) 10 MG tablet Take 1 tablet (10 mg total) by mouth daily.      aspirin EC 81 MG tablet Take 81 mg by mouth daily. Swallow whole.     atorvastatin (LIPITOR) 40 MG tablet Take 1 tablet (40 mg total) by mouth daily at 6 PM. (Patient taking differently: Take 40 mg by mouth daily.) 30 tablet 0   Azelaic Acid (FINACEA) 15 % FOAM Apply topically. Apply to scalp twice daily     B-D ULTRAFINE III SHORT PEN 31G X 8 MM MISC SMARTSIG:1 Each SUB-Q Daily     CHELATED IRON PO Take 18 mg by mouth daily.     chlorthalidone (HYGROTON) 25 MG tablet Take 0.5 tablets (12.5 mg total) by mouth daily. 45 tablet 3   Cholecalciferol (VITAMIN D3) 125 MCG (5000 UT) CAPS Take 5,000 Units by mouth daily.     clindamycin (CLEOCIN T) 1 % external solution Apply to underarm daily to twice as needed.     CLOBETASOL PROPIONATE E 0.05 % emollient cream Apply topically 3 (three) times daily.     dapagliflozin propanediol (FARXIGA) 5 MG TABS tablet Take 1 tablet (5 mg total) by mouth daily before breakfast.     Dulaglutide (TRULICITY) 1.5 MG/0.5ML SOAJ Inject 1.5 mg into the skin once a week. 2 mL 5   EPINEPHrine (EPIPEN 2-PAK) 0.3 mg/0.3 mL IJ SOAJ injection Use as directed for life-threatening allergic reaction. 2 each 1   epoetin alfa-epbx (RETACRIT) 2000 UNIT/ML injection See admin instructions.     escitalopram (LEXAPRO) 20 MG tablet Take 20 mg by mouth daily.     ezetimibe (ZETIA) 10 MG tablet Take 1 tablet (10 mg total) by mouth daily.     fexofenadine (ALLEGRA ALLERGY) 180 MG tablet Take 1 tablet (180 mg total) by mouth  daily.     Finerenone (KERENDIA) 20 MG TABS Take 10 mg by mouth daily.     hydrALAZINE (APRESOLINE) 50 MG tablet Take 50 mg by mouth 3 (three) times daily.     hydrocortisone cream 1 % Apply 1 Application topically 2 (two) times daily as needed for itching.     inFLIXimab (REMICADE) 100 MG injection Inject into the vein.     insulin aspart (NOVOLOG) 100 UNIT/ML FlexPen Inject into the skin See admin instructions. Sliding Scale     insulin glargine, 2 Unit Dial,  (TOUJEO MAX SOLOSTAR) 300 UNIT/ML Solostar Pen Inject 42 Units into the skin at bedtime.     irbesartan (AVAPRO) 300 MG tablet Take 1 tablet (300 mg total) by mouth daily.     KERENDIA 10 MG TABS 10 mg daily.     Lancets Misc. (ACCU-CHEK SOFTCLIX LANCET DEV) KIT See admin instructions.     latanoprost (XALATAN) 0.005 % ophthalmic solution Place 1 drop into both eyes at bedtime.     leflunomide (ARAVA) 20 MG tablet Take 20 mg by mouth daily.     lidocaine (LMX) 4 % cream Apply 1 Application topically as needed (pain).     metoprolol succinate (TOPROL-XL) 25 MG 24 hr tablet Take 1.5 tablets (37.5 mg total) by mouth daily. May take additional half tablet as needed once daily for palpitations.     Multiple Vitamins-Minerals (CENTRUM SILVER PO) Take 1 tablet by mouth daily with breakfast.     sulfaSALAzine (AZULFIDINE) 500 MG tablet Take 3 tablets (1,500 mg total) by mouth 2 (two) times daily.     SYNTHROID 125 MCG tablet Take 125 mcg by mouth every Monday, Tuesday, Wednesday, Thursday, and Friday.     triamcinolone cream (KENALOG) 0.1 % Apply 1 Application topically 2 (two) times daily as needed (for itching- affected areas).     vitamin B-12 (CYANOCOBALAMIN) 1000 MCG tablet Take 1,000 mcg by mouth daily.     No current facility-administered medications for this visit.    PHYSICAL EXAMINATION: ECOG PERFORMANCE STATUS: 1 - Symptomatic but completely ambulatory  Vitals:   02/04/24 1115  BP: (!) 135/56  Pulse: 66  Resp: 18  Temp: (!) 97.5 F (36.4 C)  SpO2: 99%   Filed Weights   02/04/24 1115  Weight: 223 lb 1.6 oz (101.2 kg)      LABORATORY DATA:  I have reviewed the data as listed    Latest Ref Rng & Units 09/10/2023    9:22 AM 08/13/2023    1:40 PM 07/17/2023   10:23 AM  CMP  Glucose 70 - 99 mg/dL 782  956  213   BUN 8 - 23 mg/dL 21  32  30   Creatinine 0.44 - 1.00 mg/dL 0.86  5.78  4.69   Sodium 135 - 145 mmol/L 141  141  140   Potassium 3.5 - 5.1 mmol/L 4.7  4.3  4.3    Chloride 98 - 111 mmol/L 110  110  110   CO2 22 - 32 mmol/L 27  27  28    Calcium 8.9 - 10.3 mg/dL 9.3  9.1  8.8   Total Protein 6.5 - 8.1 g/dL 6.3  6.2  6.0   Total Bilirubin <1.2 mg/dL 0.4  0.3  0.3   Alkaline Phos 38 - 126 U/L 77  76  65   AST 15 - 41 U/L 23  22  17    ALT 0 - 44 U/L 18  17  15  Lab Results  Component Value Date   WBC 7.2 02/04/2024   HGB 10.3 (L) 02/04/2024   HCT 34.2 (L) 02/04/2024   MCV 87.9 02/04/2024   PLT 156 02/04/2024   NEUTROABS 5.1 02/04/2024    ASSESSMENT & PLAN:  Normochromic normocytic anemia Patient has history of hypothyroidism as well as possible adrenal dysfunction issues for which she goes to [facility]    Etiology: 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  (Retacrit injections for hemoglobin 11 or less) Lab review 03/23/2020: Erythropoietin 27.9, hemoglobin 9.6 02/02/2021: Hemoglobin 10.6 (given her severe symptoms continue with Aranesp) 01/02/2022: Hemoglobin 10.7 (no Retacrit today): Last Retacrit was on 12/05/2021  09/19/2022: Hemoglobin 11.2 (Retacrit given 08/31/2022) 12/18/2022: Hemoglobin 10.6 (recommended that she receive Retacrit today) 06/25/2023: Hemoglobin 11.2 (held Retacrit) 07/17/2023: Hemoglobin 10.2 10/08/2023: Hemoglobin 9.9 12/10/2023: Hemoglobin 12.1 (methotrexate was discontinued in December 2024) 02/04/2024: Hemoglobin 10.3   We assume that the hemoglobin improved once the methotrexate was discontinued.  Unfortunately the hemoglobin dropped again.  So we will resume her Retacrit injections. Recheck her labs monthly and injection appointments if hemoglobin is less than 10.6. She plans to take iron and folic acid supplement. I will see her back in 3 months with labs and follow-up.      No orders of the defined types were placed in this  encounter.  The patient has a good understanding of the overall plan. she agrees with it. she will call with any problems that may develop before the next visit here. Total time spent: 30 mins including face to face time and time spent for planning, charting and co-ordination of care   Tamsen Meek, MD 02/04/24

## 2024-02-04 NOTE — Assessment & Plan Note (Signed)
 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  (Retacrit injections for hemoglobin 11 or less) Lab review 03/23/2020: Erythropoietin 27.9, hemoglobin 9.6 02/02/2021: Hemoglobin 10.6 (given her severe symptoms continue with Aranesp) 01/02/2022: Hemoglobin 10.7 (no Retacrit today): Last Retacrit was on 12/05/2021  09/19/2022: Hemoglobin 11.2 (Retacrit given 08/31/2022) 12/18/2022: Hemoglobin 10.6 (recommended that she receive Retacrit today) 06/25/2023: Hemoglobin 11.2 (held Retacrit) 07/17/2023: Hemoglobin 10.2 10/08/2023: Hemoglobin 9.9 12/10/2023: Hemoglobin 12.1 (methotrexate was discontinued in December 2024) 02/04/2024:   Patient does not need injections any further.  Since methotrexate was discontinued her anemia has improved. Return to clinic in 2 months with labs and follow-up.

## 2024-02-07 ENCOUNTER — Encounter: Payer: Self-pay | Admitting: Rheumatology

## 2024-02-17 DIAGNOSIS — E113392 Type 2 diabetes mellitus with moderate nonproliferative diabetic retinopathy without macular edema, left eye: Secondary | ICD-10-CM | POA: Diagnosis not present

## 2024-02-21 DIAGNOSIS — M0579 Rheumatoid arthritis with rheumatoid factor of multiple sites without organ or systems involvement: Secondary | ICD-10-CM | POA: Diagnosis not present

## 2024-02-21 DIAGNOSIS — E559 Vitamin D deficiency, unspecified: Secondary | ICD-10-CM | POA: Diagnosis not present

## 2024-02-24 ENCOUNTER — Telehealth: Payer: Self-pay | Admitting: Neurology

## 2024-02-24 NOTE — Telephone Encounter (Signed)
 Pt has called to r/s the appointment she was to have with Megan, NP from December of '23, pt was told because of the time that has gone by she would have to f/u with Dr Omar Bibber.

## 2024-02-27 DIAGNOSIS — H401131 Primary open-angle glaucoma, bilateral, mild stage: Secondary | ICD-10-CM | POA: Diagnosis not present

## 2024-03-05 ENCOUNTER — Inpatient Hospital Stay: Attending: Hematology and Oncology

## 2024-03-05 ENCOUNTER — Inpatient Hospital Stay

## 2024-03-05 VITALS — BP 112/44 | HR 60 | Temp 98.3°F | Resp 17

## 2024-03-05 DIAGNOSIS — D638 Anemia in other chronic diseases classified elsewhere: Secondary | ICD-10-CM

## 2024-03-05 DIAGNOSIS — D631 Anemia in chronic kidney disease: Secondary | ICD-10-CM | POA: Insufficient documentation

## 2024-03-05 DIAGNOSIS — N1831 Chronic kidney disease, stage 3a: Secondary | ICD-10-CM | POA: Insufficient documentation

## 2024-03-05 DIAGNOSIS — D649 Anemia, unspecified: Secondary | ICD-10-CM

## 2024-03-05 DIAGNOSIS — E1122 Type 2 diabetes mellitus with diabetic chronic kidney disease: Secondary | ICD-10-CM | POA: Insufficient documentation

## 2024-03-05 LAB — CBC WITH DIFFERENTIAL (CANCER CENTER ONLY)
Abs Immature Granulocytes: 0.02 10*3/uL (ref 0.00–0.07)
Basophils Absolute: 0.1 10*3/uL (ref 0.0–0.1)
Basophils Relative: 1 %
Eosinophils Absolute: 0.1 10*3/uL (ref 0.0–0.5)
Eosinophils Relative: 2 %
HCT: 34.8 % — ABNORMAL LOW (ref 36.0–46.0)
Hemoglobin: 10.7 g/dL — ABNORMAL LOW (ref 12.0–15.0)
Immature Granulocytes: 0 %
Lymphocytes Relative: 24 %
Lymphs Abs: 1.4 10*3/uL (ref 0.7–4.0)
MCH: 27 pg (ref 26.0–34.0)
MCHC: 30.7 g/dL (ref 30.0–36.0)
MCV: 87.9 fL (ref 80.0–100.0)
Monocytes Absolute: 0.5 10*3/uL (ref 0.1–1.0)
Monocytes Relative: 8 %
Neutro Abs: 3.8 10*3/uL (ref 1.7–7.7)
Neutrophils Relative %: 65 %
Platelet Count: 169 10*3/uL (ref 150–400)
RBC: 3.96 MIL/uL (ref 3.87–5.11)
RDW: 14.4 % (ref 11.5–15.5)
WBC Count: 5.8 10*3/uL (ref 4.0–10.5)
nRBC: 0 % (ref 0.0–0.2)

## 2024-03-05 LAB — IRON AND IRON BINDING CAPACITY (CC-WL,HP ONLY)
Iron: 106 ug/dL (ref 28–170)
Saturation Ratios: 37 % — ABNORMAL HIGH (ref 10.4–31.8)
TIBC: 287 ug/dL (ref 250–450)
UIBC: 181 ug/dL (ref 148–442)

## 2024-03-05 LAB — FOLATE: Folate: >40 ng/mL (ref >5.9)

## 2024-03-05 LAB — FERRITIN: Ferritin: 322 ng/mL — ABNORMAL HIGH (ref 11–307)

## 2024-03-05 MED ORDER — EPOETIN ALFA-EPBX 40000 UNIT/ML IJ SOLN: 40000.0000 [IU] | Freq: Once | INTRAMUSCULAR | Status: DC

## 2024-03-05 NOTE — Progress Notes (Signed)
 Hold Retacrit  today for Hgb=10.7 per Dr. Gudena.  Dilynn Munroe, PharmD, MBA

## 2024-03-11 ENCOUNTER — Encounter: Payer: Self-pay | Admitting: Podiatry

## 2024-03-11 ENCOUNTER — Ambulatory Visit: Payer: Medicare PPO | Admitting: Podiatry

## 2024-03-11 DIAGNOSIS — M79674 Pain in right toe(s): Secondary | ICD-10-CM

## 2024-03-11 DIAGNOSIS — M79675 Pain in left toe(s): Secondary | ICD-10-CM | POA: Diagnosis not present

## 2024-03-11 DIAGNOSIS — M205X1 Other deformities of toe(s) (acquired), right foot: Secondary | ICD-10-CM

## 2024-03-11 DIAGNOSIS — E1142 Type 2 diabetes mellitus with diabetic polyneuropathy: Secondary | ICD-10-CM

## 2024-03-11 DIAGNOSIS — B351 Tinea unguium: Secondary | ICD-10-CM

## 2024-03-11 DIAGNOSIS — E119 Type 2 diabetes mellitus without complications: Secondary | ICD-10-CM

## 2024-03-15 NOTE — Progress Notes (Signed)
 ANNUAL DIABETIC FOOT EXAM  Subjective: Jocelyn Kaufman presents today for annual diabetic foot exam.  Chief Complaint  Patient presents with   Nail Problem    [facility]    Patient confirms h/o diabetes.  Patient denies any h/o foot wounds.  Patient has been diagnosed with neuropathy.  Imelda Man, MD is patient's PCP.  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   Urticaria    Patient Active Problem List   Diagnosis Date Noted   Abnormal weight loss 06/04/2023   Epigastric pain 06/04/2023   Bromhidrosis 05/28/2023   Central centrifugal scarring alopecia 05/27/2023   Female pattern hair loss 05/27/2023   Bilateral sensorineural hearing loss 04/24/2023   Physical deconditioning 09/30/2022   ABLA (acute blood loss anemia) 09/30/2022   Quadriceps muscle rupture, right, sequela 09/26/2022   Pulmonary embolus (HCC) 08/20/2022   Acquired trigger finger 05/26/2022   Abnormal reflex 02/20/2022   Anxiety 02/20/2022   Body mass index (BMI) 40.0-44.9, adult (HCC) 02/20/2022   Breast tenderness 02/20/2022   Carotid artery occlusion 02/20/2022   Cataract 02/20/2022   Stage 3a chronic kidney disease (CKD) (HCC) 02/20/2022   Diabetic renal disease (HCC) 02/20/2022   Diabetic retinopathy associated with type 2 diabetes mellitus (HCC) 02/20/2022   Encounter for general adult medical examination without abnormal findings 02/20/2022   Family history of malignant neoplasm of digestive organs 02/20/2022    Fibromyalgia 02/20/2022   Generalized anxiety disorder 02/20/2022   History of musculoskeletal disease 02/20/2022   Hypothyroid 02/20/2022   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) 02/20/2022   Morbid obesity (HCC) 02/20/2022   Mild intermittent asthma 02/20/2022   Myalgia 02/20/2022   Neuropathy of both feet 02/20/2022   Non-toxic nodular goiter 02/20/2022   Osteoarthritis of knee 02/20/2022   Obstructive sleep apnea syndrome 02/20/2022   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   Paroxysmal ventricular tachycardia (HCC) 02/20/2022   Postmenopausal state 02/20/2022   Personal history of transient ischemic attack (TIA), 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 deficits 02/20/2022   Recurrent major depression in full remission (HCC) 02/20/2022   Renal failure syndrome 02/20/2022   Sciatica 02/20/2022   Vitamin D  deficiency 02/20/2022   Bilateral hearing loss 08/29/2020   Dysequilibrium 08/29/2020   Anemia in chronic kidney disease 06/17/2020   Anemia of chronic disease 04/25/2020   Impingement syndrome of right shoulder region 11/25/2018   Osteoarthritis of right shoulder 11/03/2018   Pain in joint of right shoulder 11/03/2018   Fall at home, initial encounter 07/06/2018   Displaced spiral fracture of shaft of humerus, right arm, initial encounter for closed fracture 07/06/2018   TIA (transient ischemic attack) 07/06/2018   Allergic rhinitis 07/04/2018   Normochromic normocytic anemia 01/20/2018   Impingement syndrome of left shoulder 12/17/2017   Rheumatoid arthritis (HCC) 04/30/2017   Lesion of left humerus 04/29/2017   Adrenal adenoma 03/19/2017  Neuropathy 03/19/2017   Spinal stenosis 03/19/2017   Pericardial effusion 03/14/2017   Diabetes mellitus (HCC) 03/14/2017   Essential hypertension 03/14/2017   Mixed hyperlipidemia 03/14/2017   Ulnar neuropathy at wrist, 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   Meralgia paresthetica of right side 01/10/2017   History of Cushing disease  03/01/2016   Allergic rhinoconjunctivitis 07/09/2015   Asthma, well controlled 07/09/2015   Laryngopharyngeal reflux (LPR) 07/09/2015   Past Surgical History:  Procedure Laterality Date   ADENOIDECTOMY     APPENDECTOMY     biopsy Left 12/2021   shin   biospy Right 04/2021   R 5th digit   biospy Left 04/2021   foot plantar surface   colonoscopy and endoscopy  2021   CYST REMOVAL LEG Right    Removed from right thigh joint   EYE SURGERY Bilateral 09/2021   cataract   foot surgery Left 09/2021   Excision lesion plantar left foot   LIGAMENT REPAIR Right 09/26/2022   Procedure: LIGAMENT REPAIR/knee;  Surgeon: Micheline Ahr, MD;  Location: [facility] OR;  Service: Orthopedics;  Laterality: Right;   ORIF HUMERUS FRACTURE Right 07/09/2018   Procedure: OPEN REDUCTION INTERNAL FIXATION (ORIF) HUMERAL SHAFT FRACTURE;  Surgeon: Micheline Ahr, MD;  Location: [facility] OR;  Service: Orthopedics;  Laterality: Right;   QUADRICEPS TENDON REPAIR Right 09/26/2022   Procedure: REVISION REPAIR QUADRICEP TENDON;  Surgeon: Micheline Ahr, MD;  Location: [facility] OR;  Service: Orthopedics;  Laterality: Right;   TONSILLECTOMY     torn right knee tendon repair Right    2023   TOTAL VAGINAL HYSTERECTOMY     UMBILICAL HERNIA REPAIR     Current Outpatient Medications on File Prior to Visit  Medication Sig Dispense Refill   ACCU-CHEK GUIDE test strip      Accu-Chek Softclix Lancets lancets use to check blood sugar     Aflibercept  (EYLEA ) 2 MG/0.05ML SOLN 2 mg by Intravitreal route every 3 (three) months.     albuterol  (VENTOLIN  HFA) 108 (90 Base) MCG/ACT inhaler Inhale two puffs every four to six hours as needed for cough or wheeze. (Patient taking differently: Inhale 1-2 puffs into the lungs every 6 (six) hours as needed for wheezing.) 18 g 2   amLODipine  (NORVASC ) 10 MG tablet Take 1 tablet (10 mg total) by mouth daily.     aspirin  EC 81 MG tablet Take 81 mg by mouth daily. Swallow whole.     atorvastatin  (LIPITOR) 40 MG  tablet Take 1 tablet (40 mg total) by mouth daily at 6 PM. (Patient taking differently: Take 40 mg by mouth daily.) 30 tablet 0   Azelaic Acid (FINACEA) 15 % FOAM Apply topically. Apply to scalp twice daily     B-D ULTRAFINE III SHORT PEN 31G X 8 MM MISC SMARTSIG:1 Each SUB-Q Daily     CHELATED IRON PO Take 18 mg by mouth daily.     chlorthalidone  (HYGROTON ) 25 MG tablet Take 0.5 tablets (12.5 mg total) by mouth daily. 45 tablet 3   Cholecalciferol  (VITAMIN D3) 125 MCG (5000 UT) CAPS Take 5,000 Units by mouth daily.     clindamycin (CLEOCIN T) 1 % external solution Apply to underarm daily to twice as needed.     CLOBETASOL PROPIONATE E 0.05 % emollient cream Apply topically 3 (three) times daily.     dapagliflozin  propanediol (FARXIGA ) 5 MG TABS tablet Take 1 tablet (5 mg total) by mouth daily before breakfast.     Dulaglutide  (TRULICITY ) 1.5 MG/0.5ML SOAJ Inject 1.5 mg  into the skin once a week. 2 mL 5   EPINEPHrine  (EPIPEN  2-PAK) 0.3 mg/0.3 mL IJ SOAJ injection Use as directed for life-threatening allergic reaction. 2 each 1   epoetin  alfa-epbx (RETACRIT ) 2000 UNIT/ML injection See admin instructions.     escitalopram  (LEXAPRO ) 20 MG tablet Take 20 mg by mouth daily.     ezetimibe  (ZETIA ) 10 MG tablet Take 1 tablet (10 mg total) by mouth daily.     fexofenadine  (ALLEGRA  ALLERGY) 180 MG tablet Take 1 tablet (180 mg total) by mouth daily.     Finerenone (KERENDIA) 20 MG TABS Take 10 mg by mouth daily.     hydrALAZINE  (APRESOLINE ) 50 MG tablet Take 50 mg by mouth 3 (three) times daily.     hydrocortisone cream 1 % Apply 1 Application topically 2 (two) times daily as needed for itching.     inFLIXimab  (REMICADE ) 100 MG injection Inject into the vein.     insulin  aspart (NOVOLOG ) 100 UNIT/ML FlexPen Inject into the skin See admin instructions. Sliding Scale     insulin  glargine, 2 Unit Dial, (TOUJEO  MAX SOLOSTAR) 300 UNIT/ML Solostar Pen Inject 42 Units into the skin at bedtime.     irbesartan   (AVAPRO ) 300 MG tablet Take 1 tablet (300 mg total) by mouth daily.     KERENDIA 10 MG TABS 10 mg daily.     Lancets Misc. (ACCU-CHEK SOFTCLIX LANCET DEV) KIT See admin instructions.     latanoprost  (XALATAN ) 0.005 % ophthalmic solution Place 1 drop into both eyes at bedtime.     leflunomide (ARAVA) 20 MG tablet Take 20 mg by mouth daily.     lidocaine  (LMX) 4 % cream Apply 1 Application topically as needed (pain).     metoprolol  succinate (TOPROL -XL) 25 MG 24 hr tablet Take 1.5 tablets (37.5 mg total) by mouth daily. May take additional half tablet as needed once daily for palpitations.     Multiple Vitamins-Minerals (CENTRUM SILVER  PO) Take 1 tablet by mouth daily with breakfast.     sulfaSALAzine  (AZULFIDINE ) 500 MG tablet Take 3 tablets (1,500 mg total) by mouth 2 (two) times daily.     SYNTHROID  125 MCG tablet Take 125 mcg by mouth every Monday, Tuesday, Wednesday, Thursday, and Friday.     triamcinolone  cream (KENALOG ) 0.1 % Apply 1 Application topically 2 (two) times daily as needed (for itching- affected areas).     vitamin B-12 (CYANOCOBALAMIN ) 1000 MCG tablet Take 1,000 mcg by mouth daily.     No current facility-administered medications on file prior to visit.    Allergies  Allergen Reactions   Semaglutide (0.25 Or 0.5mg -Dos) Other (See Comments)    Causes cataracts requiring surgery and impairing vision    Shellfish-Derived Products Anaphylaxis, Swelling and Other (See Comments)    Patient avoids   Empagliflozin Itching, Other (See Comments) and Swelling    UTI sx's and yeast infections   Semaglutide  Other (See Comments)    Other reaction(s): worsening DM RETINOPATHY   Tramadol Hcl Other (See Comments)    hallucinations   Canagliflozin Other (See Comments)    Urinary incontinence    Latex Rash and Other (See Comments)   Liraglutide Itching    Other reaction(s): itching all over   Minoxidil Rash    skin irritation   Omeprazole Rash and Other (See Comments)   Percocet  [Oxycodone -Acetaminophen ] Nausea And Vomiting   Social History   Occupational History   Occupation: [employer]  Tobacco Use   Smoking status: Never  Smokeless tobacco: Never  Vaping Use   Vaping status: Never Used  Substance and Sexual Activity   Alcohol use: Not Currently    Comment: occ   Drug use: No   Sexual activity: Not Currently   Family History  Problem Relation Age of Onset   Hypertension Mother    Stroke Mother    Kidney failure Mother    Heart disease Mother        heart murmur   Glaucoma Mother    Stroke Father    COPD Father    Cancer - Prostate Father    Colon cancer Father    Diabetes Sister    Thyroid  cancer Sister    Epilepsy Sister    Ovarian cancer Paternal Aunt    Immunization History  Administered Date(s) Administered   Influenza, Quadrivalent, Recombinant, Inj, Pf 08/15/2021   Influenza,inj,Quad PF,6+ Mos 07/31/2017, 07/16/2023   PNEUMOCOCCAL CONJUGATE-20 02/01/2023   Pfizer Covid-19 Vaccine Bivalent Booster 37yrs & up 12/01/2021     Review of Systems: Negative except as noted in the HPI.   Objective: There were no vitals filed for this visit.  SHARNELLE CAPPELLI is a pleasant 68 y.o. female in NAD. AAO X 3.  Diabetic foot exam was performed with the following findings:   Vascular Examination: Capillary refill time immediate b/l. Vascular status intact b/l with palpable pedal pulses. Pedal hair present b/l. No pain with calf compression b/l. Skin temperature gradient WNL b/l. No cyanosis or clubbing b/l. No ischemia or gangrene noted b/l.   Neurological Examination: Sensation grossly intact b/l with 10 gram monofilament. Vibratory sensation intact b/l. Pt has subjective symptoms of neuropathy.  Dermatological Examination: Pedal skin with normal turgor, texture and tone b/l.  No open wounds. No interdigital macerations.   Toenails 1-5 b/l thick, discolored, elongated with subungual debris and pain on dorsal palpation.   No  hyperkeratotic nor porokeratotic lesions present on today's visit.  Musculoskeletal Examination: Muscle strength 5/5 to all lower extremity muscle groups bilaterally. 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  Radiographs: None     Lab Results  Component Value Date   HGBA1C 5.6 08/21/2022   ADA Risk Categorization: Low Risk :  Patient has all of the following: Intact protective sensation No prior foot ulcer  No severe deformity Pedal pulses present  Assessment: 1. Pain due to onychomycosis of toenails of both feet   2. Adductovarus rotation of toe, acquired, right   3. Diabetic peripheral neuropathy associated with type 2 diabetes mellitus (HCC)   4. Encounter for diabetic foot exam (HCC)     Plan: Diabetic foot examination performed today. All patient's and/or POA's questions/concerns addressed on today's visit. Toenails 1-5 debrided in length and girth without incident. Continue foot and shoe inspections daily. Monitor blood glucose per PCP/Endocrinologist's recommendations. Continue soft, supportive shoe gear daily. Report any pedal injuries to medical professional. Call office if there are any questions/concerns. -Patient/POA to call should there be question/concern in the interim. Return in about 3 months (around 06/11/2024).  Jocelyn Kaufman, DPM      [place] LOCATION: [address] [place], [place] [address]  Office [phone]   [facility] LOCATION: [address] [place], [place] [address] Office [phone]

## 2024-03-16 DIAGNOSIS — E113311 Type 2 diabetes mellitus with moderate nonproliferative diabetic retinopathy with macular edema, right eye: Secondary | ICD-10-CM | POA: Diagnosis not present

## 2024-03-17 DIAGNOSIS — M545 Low back pain, unspecified: Secondary | ICD-10-CM | POA: Diagnosis not present

## 2024-03-17 DIAGNOSIS — M25552 Pain in left hip: Secondary | ICD-10-CM | POA: Diagnosis not present

## 2024-03-17 DIAGNOSIS — M25551 Pain in right hip: Secondary | ICD-10-CM | POA: Diagnosis not present

## 2024-03-18 DIAGNOSIS — Z01419 Encounter for gynecological examination (general) (routine) without abnormal findings: Secondary | ICD-10-CM | POA: Diagnosis not present

## 2024-03-18 DIAGNOSIS — Z1231 Encounter for screening mammogram for malignant neoplasm of breast: Secondary | ICD-10-CM | POA: Diagnosis not present

## 2024-03-20 DIAGNOSIS — R351 Nocturia: Secondary | ICD-10-CM | POA: Diagnosis not present

## 2024-03-20 DIAGNOSIS — N3946 Mixed incontinence: Secondary | ICD-10-CM | POA: Diagnosis not present

## 2024-03-20 DIAGNOSIS — R350 Frequency of micturition: Secondary | ICD-10-CM | POA: Diagnosis not present

## 2024-03-27 DIAGNOSIS — M5440 Lumbago with sciatica, unspecified side: Secondary | ICD-10-CM | POA: Diagnosis not present

## 2024-03-30 DIAGNOSIS — M5440 Lumbago with sciatica, unspecified side: Secondary | ICD-10-CM | POA: Diagnosis not present

## 2024-04-01 DIAGNOSIS — M5440 Lumbago with sciatica, unspecified side: Secondary | ICD-10-CM | POA: Diagnosis not present

## 2024-04-02 ENCOUNTER — Inpatient Hospital Stay: Attending: Hematology and Oncology

## 2024-04-02 ENCOUNTER — Inpatient Hospital Stay

## 2024-04-02 ENCOUNTER — Other Ambulatory Visit: Payer: Self-pay | Admitting: *Deleted

## 2024-04-02 ENCOUNTER — Other Ambulatory Visit: Payer: Self-pay | Admitting: Hematology and Oncology

## 2024-04-02 VITALS — BP 147/71 | HR 73 | Temp 98.4°F | Resp 18

## 2024-04-02 DIAGNOSIS — D631 Anemia in chronic kidney disease: Secondary | ICD-10-CM | POA: Diagnosis not present

## 2024-04-02 DIAGNOSIS — D649 Anemia, unspecified: Secondary | ICD-10-CM

## 2024-04-02 DIAGNOSIS — E1122 Type 2 diabetes mellitus with diabetic chronic kidney disease: Secondary | ICD-10-CM | POA: Diagnosis not present

## 2024-04-02 DIAGNOSIS — N1831 Chronic kidney disease, stage 3a: Secondary | ICD-10-CM | POA: Diagnosis not present

## 2024-04-02 DIAGNOSIS — D638 Anemia in other chronic diseases classified elsewhere: Secondary | ICD-10-CM

## 2024-04-02 DIAGNOSIS — N183 Chronic kidney disease, stage 3 unspecified: Secondary | ICD-10-CM

## 2024-04-02 LAB — CBC WITH DIFFERENTIAL (CANCER CENTER ONLY)
Abs Immature Granulocytes: 0.02 10*3/uL (ref 0.00–0.07)
Basophils Absolute: 0.1 10*3/uL (ref 0.0–0.1)
Basophils Relative: 1 %
Eosinophils Absolute: 0.1 10*3/uL (ref 0.0–0.5)
Eosinophils Relative: 2 %
HCT: 33.4 % — ABNORMAL LOW (ref 36.0–46.0)
Hemoglobin: 10.1 g/dL — ABNORMAL LOW (ref 12.0–15.0)
Immature Granulocytes: 0 %
Lymphocytes Relative: 21 %
Lymphs Abs: 1.1 10*3/uL (ref 0.7–4.0)
MCH: 26.7 pg (ref 26.0–34.0)
MCHC: 30.2 g/dL (ref 30.0–36.0)
MCV: 88.4 fL (ref 80.0–100.0)
Monocytes Absolute: 0.6 10*3/uL (ref 0.1–1.0)
Monocytes Relative: 10 %
Neutro Abs: 3.6 10*3/uL (ref 1.7–7.7)
Neutrophils Relative %: 66 %
Platelet Count: 158 10*3/uL (ref 150–400)
RBC: 3.78 MIL/uL — ABNORMAL LOW (ref 3.87–5.11)
RDW: 14.7 % (ref 11.5–15.5)
WBC Count: 5.5 10*3/uL (ref 4.0–10.5)
nRBC: 0 % (ref 0.0–0.2)

## 2024-04-02 LAB — CMP (CANCER CENTER ONLY)
ALT: 17 U/L (ref 0–44)
AST: 20 U/L (ref 15–41)
Albumin: 4 g/dL (ref 3.5–5.0)
Alkaline Phosphatase: 58 U/L (ref 38–126)
Anion gap: 4 — ABNORMAL LOW (ref 5–15)
BUN: 27 mg/dL — ABNORMAL HIGH (ref 8–23)
CO2: 27 mmol/L (ref 22–32)
Calcium: 9.1 mg/dL (ref 8.9–10.3)
Chloride: 111 mmol/L (ref 98–111)
Creatinine: 1.09 mg/dL — ABNORMAL HIGH (ref 0.44–1.00)
GFR, Estimated: 55 mL/min — ABNORMAL LOW (ref >60)
Glucose, Bld: 86 mg/dL (ref 70–99)
Potassium: 4.4 mmol/L (ref 3.5–5.1)
Sodium: 142 mmol/L (ref 135–145)
Total Bilirubin: 0.3 mg/dL (ref 0.0–1.2)
Total Protein: 6.5 g/dL (ref 6.5–8.1)

## 2024-04-02 LAB — IRON AND IRON BINDING CAPACITY (CC-WL,HP ONLY)
Iron: 108 ug/dL (ref 28–170)
Saturation Ratios: 39 % — ABNORMAL HIGH (ref 10.4–31.8)
TIBC: 279 ug/dL (ref 250–450)
UIBC: 171 ug/dL (ref 148–442)

## 2024-04-02 LAB — FERRITIN: Ferritin: 310 ng/mL — ABNORMAL HIGH (ref 11–307)

## 2024-04-02 MED ORDER — EPOETIN ALFA-EPBX 40000 UNIT/ML IJ SOLN
40000.0000 [IU] | Freq: Once | INTRAMUSCULAR | Status: AC
  Administered 2024-04-02: 40000 [IU] via SUBCUTANEOUS
  Filled 2024-04-02: qty 1

## 2024-04-02 NOTE — Progress Notes (Unsigned)
 Cardiology Office Note:  .   Date:  04/03/2024  ID:  Jocelyn Kaufman, DOB Nov 17, 1955, MRN [id number] PCP: Imelda Man, MD  Nephrology: Dr. Shaune Delaine [facility] Providers Cardiologist:  Hazle Lites, MD    History of Present Illness: .   Jocelyn Kaufman is a 68 y.o. 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   Seen 04/2021 doing overall well with excpetion of one episode of chest discomfort that was atypical for angina as occurred at rest and self resolved. It occurred after a fall in her yard and was thought to be musculoskeletal in natrua. No ischemic evaluation recommended.    At last visit 05/07/22 noted exertional dyspnea with subsequent echocardiogram 04/2022 normal LVEF 55-60%, moderate to severe mitral calcification with mild MR. Myoview  07/31/22 low risk study with no ischemia.    Admitted 08/20/22 with PE after quadriceps tendon repair 2 weeks prior. Treated with OAC for 3 months. Echo during admission 07/2022 LVEF 45-50%, LV global hypokinesis, mild MR, aortic sclerosis without stenosis.   Updated echo 02/2023 LVEF 50-55%, moderate LVH, aortic valve sclerosis without stenosis. At follow up 06/22/23 Chlorthalidone  was changed from PRN to 12.5mg  daily.   Last seen 12/03/2023.  She had atypical chest pain the week prior for 3 days, no ischemic evaluation recommended.  She was given clearance to use minoxidil per dermatology for hair loss.  Via subsequent MyChart message she noted increased palpitations and metoprolol  was increased to 37.5 mg daily.  Presents today for follow-up. Most bothered by back pain for which she is participating in PT. Back pain has limited her activity. Has been told she has bone spur on her left hip, increase in disc compression.  One brief episode of palpitations last week which was surprising as had not occurred recently. It self resolved. Does endorse shortness of breath which she attributes to not being as mobile, anemia, fluid retention. Dyspnea is most notable with exertion and mild intermittent orthopnea but no PND. She also notes she has not been taking Chlorthalidone  due to concerns about urinary frequency with multiple appointments. BP at home had been 130s, most recent 141 at an MD visit.   ROS: Please see the history of present illness.    All other systems reviewed and are negative.   Studies Reviewed: Aaron Aas   EKG Interpretation Date/Time:  Friday April 03 2024 09:28:43 EDT Ventricular Rate:  76 PR Interval:  188 QRS Duration:  94 QT Interval:  392 QTC Calculation: 441 R Axis:   -67  Text Interpretation: Normal sinus rhythm Left anterior fascicular block No acute ST/T wave changes. Confirmed by Neomi Banks ([id number]) on 04/03/2024 9:34:57 AM       Risk Assessment/Calculations:            Physical Exam:   VS:  BP 138/64 (BP Location: Left Arm, Patient Position: Sitting, Cuff Size: Normal)   Pulse 71   Ht 5\' 4"  (1.626 m)   Wt 230 lb (104.3 kg)   SpO2 98%   BMI 39.48 kg/m    Wt Readings from Last 3 Encounters:  04/03/24 230 lb (104.3 kg)  02/04/24 223 lb 1.6 oz (101.2 kg)  12/31/23 220 lb (99.8 kg)    GEN: Well nourished, well developed in no acute distress NECK: No JVD; No carotid bruits CARDIAC: RRR,  no murmurs, rubs, gallops RESPIRATORY:  Clear to auscultation without rales, wheezing or rhonchi  ABDOMEN: Soft, non-tender, non-distended EXTREMITIES:  Minimal pretibial edema; No deformity   ASSESSMENT AND PLAN: .    Diastolic heart failure - Echo 16/1096 LVEF 45-50% in setting of PE. Echo 02/2023 LVEF 50-55%, grIII diastolic dysfunction. Low sodium diet, fluid restriction <2 L, and daily weights encouraged. Educated to contact our office for weight gain of 2 lb overnight or 5 lb in one week.  Weight today is up 5 lbs from clinic visit 6 months ago.Volume up due to inconsistent utilization of chlorthalidone .  Previously did not tolerate Jardiance due to yeast infection. Able to tolerate Farxiga  5mg  daily.  Continue on GDMT- Metoprolol  25 mg, Irbesartan  300 mg, and Farxiga  5 mg. Stop Chlorthalidone . Rx Furosemide (Lasix) 20mg  daily x 2 days then PRN. She will let us  know if taking more than 3x per week. Anticipate this will help to better control volume status as she was only taking Chlorthalidone  sporadically.    NSVT / PVC - Well controlled on Toprol  37.5 mg QD. She may take additional half tablet as needed for breakthrough palpitations.    DM2 - Continue to follow with PCP. Appreciate SGLT2i and GLP 1 for cardioprotective benefit.    Coronary artery calcification on CT / HLD, LDL goal <70 - Inadvertent finding. Myoview  07/2022 low risk. Continue on GDMT- aspirin  81 mg, atorvastatin  40 mg.Recommend aiming for 150 minutes of moderate intensity activity per week and following a heart healthy diet.     HTN - BP not at goal <130/80. Likely exacerbated by fluid retention. Taking chlorthalidone  intermittently due to concerns with urinary frequency going to appointments, etc. Stop Chlorthalidone , start Lasix as above. Continue Amlodipine  10mg  daily, Hydralazine  50mg  TID, Irbesartan  300mg  daily, Toprol  37.5mg  daily.    OSA - CPAP compliance encourage. She is wearing regularly.   CKD - On Ethelene Herald per nephrology team Dr. Yvonnie Heritage.   Anemia of chronic disease - Follows with hematology. Recently received iron infusion   Obesity - On Trulicity  1.5 mg. Managed by endocrinology.        Dispo: follow up 4 months.   Signed, Clearnce Curia, NP

## 2024-04-03 ENCOUNTER — Other Ambulatory Visit (HOSPITAL_BASED_OUTPATIENT_CLINIC_OR_DEPARTMENT_OTHER): Payer: Self-pay

## 2024-04-03 ENCOUNTER — Other Ambulatory Visit (HOSPITAL_COMMUNITY): Payer: Self-pay

## 2024-04-03 ENCOUNTER — Ambulatory Visit (HOSPITAL_BASED_OUTPATIENT_CLINIC_OR_DEPARTMENT_OTHER): Payer: Medicare PPO | Admitting: Family

## 2024-04-03 ENCOUNTER — Encounter (HOSPITAL_BASED_OUTPATIENT_CLINIC_OR_DEPARTMENT_OTHER): Payer: Self-pay | Admitting: Family

## 2024-04-03 VITALS — BP 138/64 | HR 71 | Ht 64.0 in | Wt 230.0 lb

## 2024-04-03 DIAGNOSIS — I4729 Other ventricular tachycardia: Secondary | ICD-10-CM

## 2024-04-03 DIAGNOSIS — I5032 Chronic diastolic (congestive) heart failure: Secondary | ICD-10-CM | POA: Diagnosis not present

## 2024-04-03 DIAGNOSIS — I10 Essential (primary) hypertension: Secondary | ICD-10-CM

## 2024-04-03 DIAGNOSIS — G4733 Obstructive sleep apnea (adult) (pediatric): Secondary | ICD-10-CM

## 2024-04-03 MED ORDER — FUROSEMIDE 20 MG PO TABS
ORAL_TABLET | ORAL | 3 refills | Status: AC
  Filled 2024-04-03: qty 90, 90d supply, fill #0

## 2024-04-03 MED ORDER — TRULICITY 1.5 MG/0.5ML ~~LOC~~ SOAJ
1.5000 mg | SUBCUTANEOUS | 1 refills | Status: AC
  Filled 2024-04-03 – 2024-09-28 (×3): qty 6, 84d supply, fill #0

## 2024-04-03 MED ORDER — METOPROLOL SUCCINATE ER 25 MG PO TB24
37.5000 mg | ORAL_TABLET | Freq: Every day | ORAL | 3 refills | Status: AC
  Filled 2024-04-03: qty 135, 90d supply, fill #0
  Filled 2024-07-02: qty 135, 90d supply, fill #1
  Filled 2024-10-05: qty 135, 90d supply, fill #2

## 2024-04-03 NOTE — Patient Instructions (Addendum)
 Medication Instructions:   STOP Chlorthalidone   START Furosemide (Lasix) 20mg  daily for 2 days then as needed for fluid retention If taking more than 3 times per week, let us  know as we may change the dosing Lasix lasts six hours so okay to take in the morning or in the afternoon but would recommend taking no later than 2 pm  *If you need a refill on your cardiac medications before your next appointment, please call your pharmacy*  Testing/Procedures: Your EKG today looked great!  Follow-Up: At [facility], you and your health needs are our priority.  As part of our continuing mission to provide you with exceptional heart care, our providers are all part of one team.  This team includes your primary Cardiologist (physician) and Advanced Practice Providers or APPs (Physician Assistants and Nurse Practitioners) who all work together to provide you with the care you need, when you need it.  Your next appointment:   4 month(s)  Provider:   K. Italy Hilty, MD or Neomi Banks, NP

## 2024-04-06 DIAGNOSIS — M0579 Rheumatoid arthritis with rheumatoid factor of multiple sites without organ or systems involvement: Secondary | ICD-10-CM | POA: Diagnosis not present

## 2024-04-08 DIAGNOSIS — N1831 Chronic kidney disease, stage 3a: Secondary | ICD-10-CM | POA: Diagnosis not present

## 2024-04-08 DIAGNOSIS — M5440 Lumbago with sciatica, unspecified side: Secondary | ICD-10-CM | POA: Diagnosis not present

## 2024-04-14 DIAGNOSIS — M5440 Lumbago with sciatica, unspecified side: Secondary | ICD-10-CM | POA: Diagnosis not present

## 2024-04-16 ENCOUNTER — Other Ambulatory Visit (HOSPITAL_COMMUNITY): Payer: Self-pay

## 2024-04-16 ENCOUNTER — Other Ambulatory Visit: Payer: Self-pay

## 2024-04-16 DIAGNOSIS — M79643 Pain in unspecified hand: Secondary | ICD-10-CM | POA: Diagnosis not present

## 2024-04-16 DIAGNOSIS — M0579 Rheumatoid arthritis with rheumatoid factor of multiple sites without organ or systems involvement: Secondary | ICD-10-CM | POA: Diagnosis not present

## 2024-04-16 DIAGNOSIS — M8580 Other specified disorders of bone density and structure, unspecified site: Secondary | ICD-10-CM | POA: Diagnosis not present

## 2024-04-16 DIAGNOSIS — M1990 Unspecified osteoarthritis, unspecified site: Secondary | ICD-10-CM | POA: Diagnosis not present

## 2024-04-16 DIAGNOSIS — Z79899 Other long term (current) drug therapy: Secondary | ICD-10-CM | POA: Diagnosis not present

## 2024-04-16 DIAGNOSIS — M25569 Pain in unspecified knee: Secondary | ICD-10-CM | POA: Diagnosis not present

## 2024-04-16 DIAGNOSIS — M797 Fibromyalgia: Secondary | ICD-10-CM | POA: Diagnosis not present

## 2024-04-16 DIAGNOSIS — M1711 Unilateral primary osteoarthritis, right knee: Secondary | ICD-10-CM | POA: Diagnosis not present

## 2024-04-16 MED ORDER — SULFASALAZINE 500 MG PO TABS
500.0000 mg | ORAL_TABLET | Freq: Two times a day (BID) | ORAL | 0 refills | Status: DC
  Filled 2024-04-16 – 2024-04-17 (×2): qty 180, 90d supply, fill #0

## 2024-04-17 ENCOUNTER — Other Ambulatory Visit (HOSPITAL_COMMUNITY): Payer: Self-pay

## 2024-04-17 MED ORDER — LEFLUNOMIDE 20 MG PO TABS
20.0000 mg | ORAL_TABLET | Freq: Every day | ORAL | 0 refills | Status: DC
  Filled 2024-04-17: qty 90, 90d supply, fill #0

## 2024-04-21 ENCOUNTER — Other Ambulatory Visit (HOSPITAL_COMMUNITY): Payer: Self-pay

## 2024-04-21 DIAGNOSIS — B029 Zoster without complications: Secondary | ICD-10-CM | POA: Diagnosis not present

## 2024-04-21 MED ORDER — VALACYCLOVIR HCL 1 G PO TABS
1000.0000 mg | ORAL_TABLET | Freq: Three times a day (TID) | ORAL | 0 refills | Status: DC
  Filled 2024-04-21: qty 21, 7d supply, fill #0

## 2024-04-27 ENCOUNTER — Other Ambulatory Visit: Payer: Self-pay

## 2024-04-27 ENCOUNTER — Other Ambulatory Visit (HOSPITAL_BASED_OUTPATIENT_CLINIC_OR_DEPARTMENT_OTHER): Payer: Self-pay

## 2024-04-27 DIAGNOSIS — N3946 Mixed incontinence: Secondary | ICD-10-CM | POA: Diagnosis not present

## 2024-04-27 DIAGNOSIS — R350 Frequency of micturition: Secondary | ICD-10-CM | POA: Diagnosis not present

## 2024-04-27 MED ORDER — GEMTESA 75 MG PO TABS
75.0000 mg | ORAL_TABLET | Freq: Every day | ORAL | 11 refills | Status: DC
  Filled 2024-04-27: qty 30, 30d supply, fill #0
  Filled 2024-05-25: qty 30, 30d supply, fill #1

## 2024-04-29 ENCOUNTER — Other Ambulatory Visit: Payer: Self-pay | Admitting: *Deleted

## 2024-04-29 DIAGNOSIS — D638 Anemia in other chronic diseases classified elsewhere: Secondary | ICD-10-CM

## 2024-04-29 DIAGNOSIS — D649 Anemia, unspecified: Secondary | ICD-10-CM

## 2024-04-29 NOTE — Assessment & Plan Note (Signed)
 Normochromic normocytic anemia Patient has history of hypothyroidism as well as possible adrenal dysfunction issues for which [facility]    Etiology: Anemia of chronic disease secondary to chronic kidney disease, hypothyroidism and adrenal issues 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  (Retacrit  injections for hemoglobin 11 or less) Lab review 03/23/2020: Erythropoietin  27.9, hemoglobin 9.6 02/02/2021: Hemoglobin 10.6 (given her severe symptoms continue with Aranesp ) 01/02/2022: Hemoglobin 10.7 (no Retacrit  today): Last Retacrit  was on 12/05/2021  09/19/2022: Hemoglobin 11.2 (Retacrit  given 08/31/2022) 12/18/2022: Hemoglobin 10.6 (recommended that she receive Retacrit  today) 06/25/2023: Hemoglobin 11.2 (held Retacrit ) 07/17/2023: Hemoglobin 10.2 10/08/2023: Hemoglobin 9.9 12/10/2023: Hemoglobin 12.1 (methotrexate  was discontinued in December 2024) 02/04/2024: Hemoglobin 10.3   We assume that the hemoglobin improved once the methotrexate  was discontinued.  Unfortunately the hemoglobin dropped again.  So we will resume her Retacrit  injections. Recheck her labs monthly and injection appointments if hemoglobin is less than 10.6. She plans to take iron and folic acid  supplement. I will see her back in 3 months with labs and follow-up.

## 2024-04-30 ENCOUNTER — Inpatient Hospital Stay

## 2024-04-30 ENCOUNTER — Inpatient Hospital Stay: Attending: Hematology and Oncology | Admitting: Hematology and Oncology

## 2024-04-30 ENCOUNTER — Other Ambulatory Visit (HOSPITAL_COMMUNITY): Payer: Self-pay

## 2024-04-30 VITALS — BP 124/57 | HR 66 | Temp 98.7°F | Resp 17 | Ht 64.0 in | Wt 218.8 lb

## 2024-04-30 DIAGNOSIS — I129 Hypertensive chronic kidney disease with stage 1 through stage 4 chronic kidney disease, or unspecified chronic kidney disease: Secondary | ICD-10-CM | POA: Diagnosis not present

## 2024-04-30 DIAGNOSIS — N183 Chronic kidney disease, stage 3 unspecified: Secondary | ICD-10-CM | POA: Diagnosis not present

## 2024-04-30 DIAGNOSIS — D649 Anemia, unspecified: Secondary | ICD-10-CM

## 2024-04-30 DIAGNOSIS — R809 Proteinuria, unspecified: Secondary | ICD-10-CM | POA: Diagnosis not present

## 2024-04-30 DIAGNOSIS — E1122 Type 2 diabetes mellitus with diabetic chronic kidney disease: Secondary | ICD-10-CM | POA: Diagnosis not present

## 2024-04-30 DIAGNOSIS — D631 Anemia in chronic kidney disease: Secondary | ICD-10-CM | POA: Insufficient documentation

## 2024-04-30 DIAGNOSIS — Z794 Long term (current) use of insulin: Secondary | ICD-10-CM | POA: Insufficient documentation

## 2024-04-30 DIAGNOSIS — D638 Anemia in other chronic diseases classified elsewhere: Secondary | ICD-10-CM

## 2024-04-30 DIAGNOSIS — N1831 Chronic kidney disease, stage 3a: Secondary | ICD-10-CM | POA: Insufficient documentation

## 2024-04-30 LAB — CMP (CANCER CENTER ONLY)
ALT: 20 U/L (ref 0–44)
AST: 21 U/L (ref 15–41)
Albumin: 3.9 g/dL (ref 3.5–5.0)
Alkaline Phosphatase: 65 U/L (ref 38–126)
Anion gap: 6 (ref 5–15)
BUN: 34 mg/dL — ABNORMAL HIGH (ref 8–23)
CO2: 26 mmol/L (ref 22–32)
Calcium: 9.5 mg/dL (ref 8.9–10.3)
Chloride: 104 mmol/L (ref 98–111)
Creatinine: 1.86 mg/dL — ABNORMAL HIGH (ref 0.44–1.00)
GFR, Estimated: 29 mL/min — ABNORMAL LOW (ref >60)
Glucose, Bld: 146 mg/dL — ABNORMAL HIGH (ref 70–99)
Potassium: 5.7 mmol/L — ABNORMAL HIGH (ref 3.5–5.1)
Sodium: 136 mmol/L (ref 135–145)
Total Bilirubin: 0.4 mg/dL (ref 0.0–1.2)
Total Protein: 6.7 g/dL (ref 6.5–8.1)

## 2024-04-30 LAB — CBC WITH DIFFERENTIAL (CANCER CENTER ONLY)
Abs Immature Granulocytes: 0.01 10*3/uL (ref 0.00–0.07)
Basophils Absolute: 0.1 10*3/uL (ref 0.0–0.1)
Basophils Relative: 1 %
Eosinophils Absolute: 0.1 10*3/uL (ref 0.0–0.5)
Eosinophils Relative: 1 %
HCT: 38.1 % (ref 36.0–46.0)
Hemoglobin: 11.9 g/dL — ABNORMAL LOW (ref 12.0–15.0)
Immature Granulocytes: 0 %
Lymphocytes Relative: 31 %
Lymphs Abs: 1.8 10*3/uL (ref 0.7–4.0)
MCH: 27.1 pg (ref 26.0–34.0)
MCHC: 31.2 g/dL (ref 30.0–36.0)
MCV: 86.8 fL (ref 80.0–100.0)
Monocytes Absolute: 0.6 10*3/uL (ref 0.1–1.0)
Monocytes Relative: 10 %
Neutro Abs: 3.4 10*3/uL (ref 1.7–7.7)
Neutrophils Relative %: 57 %
Platelet Count: 203 10*3/uL (ref 150–400)
RBC: 4.39 MIL/uL (ref 3.87–5.11)
RDW: 14.6 % (ref 11.5–15.5)
WBC Count: 5.9 10*3/uL (ref 4.0–10.5)
nRBC: 0 % (ref 0.0–0.2)

## 2024-04-30 LAB — IRON AND IRON BINDING CAPACITY (CC-WL,HP ONLY)
Iron: 104 ug/dL (ref 28–170)
Saturation Ratios: 38 % — ABNORMAL HIGH (ref 10.4–31.8)
TIBC: 276 ug/dL (ref 250–450)
UIBC: 172 ug/dL (ref 148–442)

## 2024-04-30 LAB — FERRITIN: Ferritin: 326 ng/mL — ABNORMAL HIGH (ref 11–307)

## 2024-04-30 MED ORDER — AMLODIPINE BESYLATE 5 MG PO TABS
5.0000 mg | ORAL_TABLET | Freq: Every day | ORAL | 3 refills | Status: AC
  Filled 2024-04-30: qty 90, 90d supply, fill #0
  Filled 2024-08-04: qty 90, 90d supply, fill #1
  Filled 2024-10-26: qty 90, 90d supply, fill #2

## 2024-04-30 NOTE — Progress Notes (Signed)
 Patient Care Team: Clarice Nottingham, MD as PCP - General (Internal Medicine) Mona Vinie BROCKS, MD as PCP - Cardiology (Cardiology) Odean Potts, MD as Consulting Physician (Hematology and Oncology) Curt Lighter, MD as Consulting Physician (Rheumatology) Josephina Aures, MD as Consulting Physician (Endocrinology) Patrcia Sharper, MD as Consulting Physician (Ophthalmology) Jerrye Katheryn BROCKS, MD as Consulting Physician (Nephrology)  DIAGNOSIS:  Encounter Diagnosis  Name Primary?   Anemia in stage 3 chronic kidney disease, unspecified whether stage 3a or 3b CKD (HCC) Yes    CHIEF COMPLIANT: Follow-up of anemia of chronic disease  HISTORY OF PRESENT ILLNESS:  History of Present Illness Jocelyn Kaufman is a 68 year old female who presents for follow-up of her blood work and recent shingles diagnosis.  Her hemoglobin level has improved from 10.1 g/dL four weeks ago to 88.0 g/dL today. She has no symptoms of fatigue, dizziness, or shortness of breath. She is currently taking sulfasalazine , having reduced her dose to one tablet twice a day.  She experienced shingles starting approximately two weeks ago, initially mistaking it for a heat rash on her back. The condition became more painful and spread, accompanied by an upset stomach. She completed a course of Valtrex . The shingles are described as painful, stinging, and itching simultaneously.     ALLERGIES:  is allergic to semaglutide (0.25 or 0.5mg -dos), shellfish-derived products, empagliflozin, semaglutide , tramadol hcl, canagliflozin, latex, liraglutide, minoxidil, omeprazole, and percocet [oxycodone -acetaminophen ].  MEDICATIONS:  Current Outpatient Medications  Medication Sig Dispense Refill   ACCU-CHEK GUIDE test strip      Accu-Chek Softclix Lancets lancets use to check blood sugar     Aflibercept  (EYLEA ) 2 MG/0.05ML SOLN 2 mg by Intravitreal route every 3 (three) months.     albuterol  (VENTOLIN  HFA) 108 (90 Base) MCG/ACT inhaler  Inhale two puffs every four to six hours as needed for cough or wheeze. (Patient taking differently: Inhale 1-2 puffs into the lungs every 6 (six) hours as needed for wheezing.) 18 g 2   amLODipine  (NORVASC ) 10 MG tablet Take 1 tablet (10 mg total) by mouth daily.     aspirin  EC 81 MG tablet Take 81 mg by mouth daily. Swallow whole.     atorvastatin  (LIPITOR) 40 MG tablet Take 1 tablet (40 mg total) by mouth daily at 6 PM. (Patient taking differently: Take 40 mg by mouth daily.) 30 tablet 0   Azelaic Acid (FINACEA) 15 % FOAM Apply topically. Apply to scalp twice daily     B-D ULTRAFINE III SHORT PEN 31G X 8 MM MISC SMARTSIG:1 Each SUB-Q Daily     CHELATED IRON PO Take 18 mg by mouth daily.     Cholecalciferol  (VITAMIN D3) 125 MCG (5000 UT) CAPS Take 5,000 Units by mouth daily.     clindamycin (CLEOCIN T) 1 % external solution Apply to underarm daily to twice as needed.     CLOBETASOL PROPIONATE E 0.05 % emollient cream Apply topically 3 (three) times daily.     dapagliflozin  propanediol (FARXIGA ) 5 MG TABS tablet Take 1 tablet (5 mg total) by mouth daily before breakfast.     Dulaglutide  (TRULICITY ) 1.5 MG/0.5ML SOAJ Inject 1.5 mg into the skin once a week. 2 mL 5   Dulaglutide  (TRULICITY ) 1.5 MG/0.5ML SOAJ Inject 1.5 mg into the skin once a week. 6 mL 5   Dulaglutide  (TRULICITY ) 1.5 MG/0.5ML SOAJ Inject 1.5 mg into the skin once a week. 6 mL 1   EPINEPHrine  (EPIPEN  2-PAK) 0.3 mg/0.3 mL IJ SOAJ injection Use as directed  for life-threatening allergic reaction. 2 each 1   epoetin  alfa-epbx (RETACRIT ) 2000 UNIT/ML injection See admin instructions.     escitalopram  (LEXAPRO ) 20 MG tablet Take 20 mg by mouth daily.     ezetimibe  (ZETIA ) 10 MG tablet Take 1 tablet (10 mg total) by mouth daily.     fexofenadine  (ALLEGRA  ALLERGY) 180 MG tablet Take 1 tablet (180 mg total) by mouth daily.     furosemide  (LASIX ) 20 MG tablet Take one tablet daily for 2 days then as needed for fluid retention. If taking more  than 3 times per week, please let cardiology team know. 90 tablet 3   hydrALAZINE  (APRESOLINE ) 50 MG tablet Take 50 mg by mouth 3 (three) times daily.     hydrocortisone cream 1 % Apply 1 Application topically 2 (two) times daily as needed for itching.     inFLIXimab  (REMICADE ) 100 MG injection Inject into the vein.     insulin  aspart (NOVOLOG ) 100 UNIT/ML FlexPen Inject into the skin See admin instructions. Sliding Scale     insulin  glargine, 2 Unit Dial, (TOUJEO  MAX SOLOSTAR) 300 UNIT/ML Solostar Pen Inject 42 Units into the skin at bedtime.     irbesartan  (AVAPRO ) 300 MG tablet Take 1 tablet (300 mg total) by mouth daily.     KERENDIA 10 MG TABS 10 mg daily.     Lancets Misc. (ACCU-CHEK SOFTCLIX LANCET DEV) KIT See admin instructions.     latanoprost  (XALATAN ) 0.005 % ophthalmic solution Place 1 drop into both eyes at bedtime.     leflunomide  (ARAVA ) 20 MG tablet Take 20 mg by mouth daily.     leflunomide  (ARAVA ) 20 MG tablet Take 1 tablet (20 mg total) by mouth daily. 90 tablet 0   lidocaine  (LMX) 4 % cream Apply 1 Application topically as needed (pain).     metoprolol  succinate (TOPROL -XL) 25 MG 24 hr tablet Take 1.5 tablets (37.5 mg total) by mouth daily. May take additional half tablet as needed once daily for palpitations. 135 tablet 3   Multiple Vitamins-Minerals (CENTRUM SILVER  PO) Take 1 tablet by mouth daily with breakfast.     sulfaSALAzine  (AZULFIDINE ) 500 MG tablet Take 3 tablets (1,500 mg total) by mouth 2 (two) times daily.     sulfaSALAzine  (AZULFIDINE ) 500 MG tablet Take 1 tablet (500 mg total) by mouth 2 (two) times daily. 180 tablet 0   SYNTHROID  125 MCG tablet Take 125 mcg by mouth every Monday, Tuesday, Wednesday, Thursday, and Friday.     triamcinolone  cream (KENALOG ) 0.1 % Apply 1 Application topically 2 (two) times daily as needed (for itching- affected areas).     Vibegron (GEMTESA PO) Take by mouth.     Vibegron (GEMTESA) 75 MG TABS Take 1 tablet (75 mg total) by mouth  daily. 30 tablet 11   vitamin B-12 (CYANOCOBALAMIN ) 1000 MCG tablet Take 1,000 mcg by mouth daily.     No current facility-administered medications for this visit.    PHYSICAL EXAMINATION: ECOG PERFORMANCE STATUS: 1 - Symptomatic but completely ambulatory  Vitals:   04/30/24 0913  BP: (!) 124/57  Pulse: 66  Resp: 17  Temp: 98.7 F (37.1 C)  SpO2: 96%   Filed Weights   04/30/24 0913  Weight: 218 lb 12.8 oz (99.2 kg)      LABORATORY DATA:  I have reviewed the data as listed    Latest Ref Rng & Units 04/02/2024    8:10 AM 09/10/2023    9:22 AM 08/13/2023    1:40  PM  CMP  Glucose 70 - 99 mg/dL 86  882  855   BUN 8 - 23 mg/dL 27  21  32   Creatinine 0.44 - 1.00 mg/dL 8.90  8.89  8.52   Sodium 135 - 145 mmol/L 142  141  141   Potassium 3.5 - 5.1 mmol/L 4.4  4.7  4.3   Chloride 98 - 111 mmol/L 111  110  110   CO2 22 - 32 mmol/L 27  27  27    Calcium  8.9 - 10.3 mg/dL 9.1  9.3  9.1   Total Protein 6.5 - 8.1 g/dL 6.5  6.3  6.2   Total Bilirubin 0.0 - 1.2 mg/dL 0.3  0.4  0.3   Alkaline Phos 38 - 126 U/L 58  77  76   AST 15 - 41 U/L 20  23  22    ALT 0 - 44 U/L 17  18  17      Lab Results  Component Value Date   WBC 5.9 04/30/2024   HGB 11.9 (L) 04/30/2024   HCT 38.1 04/30/2024   MCV 86.8 04/30/2024   PLT 203 04/30/2024   NEUTROABS 3.4 04/30/2024    ASSESSMENT & PLAN:  Anemia in chronic kidney disease Normochromic normocytic anemia Patient has history of hypothyroidism as well as possible adrenal dysfunction issues for which she goes to [facility]    Etiology: Anemia of chronic disease secondary to chronic kidney disease, hypothyroidism and adrenal issues 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  (Retacrit  injections for hemoglobin 11 or less) Lab review 03/23/2020: Erythropoietin  27.9, hemoglobin 9.6 02/02/2021: Hemoglobin 10.6 (given her severe symptoms continue with  Aranesp ) 01/02/2022: Hemoglobin 10.7 (no Retacrit  today): Last Retacrit  was on 12/05/2021  09/19/2022: Hemoglobin 11.2 (Retacrit  given 08/31/2022) 12/18/2022: Hemoglobin 10.6 (recommended that she receive Retacrit  today) 06/25/2023: Hemoglobin 11.2 (held Retacrit ) 07/17/2023: Hemoglobin 10.2 10/08/2023: Hemoglobin 9.9 12/10/2023: Hemoglobin 12.1 (methotrexate  was discontinued in December 2024) 02/04/2024: Hemoglobin 10.3 04/30/2024: Hemoglobin 11.9 (injection Retacrit  not being given today)     Recheck her labs every 4 weeks and injection appointments if hemoglobin is less than 10.6. She plans to take iron and folic acid  supplement. I will see her back in 3 months with labs and follow-up. ------------------------------------- Assessment and Plan Assessment & Plan Normochromic normocytic anemia Hemoglobin improved to 11.9 g/dL from 89.8 g/dL. Possible correlation with recent shingles infection. No clear explanation for improvement. - Recheck hemoglobin in three weeks. - Hold epoetin  alfa-epbx unless hemoglobin decreases.  Shingles Completed Valtrex  course. Symptoms ongoing but managed. - Monitor symptoms. - Manage pain and itching as needed.      Orders Placed This Encounter  Procedures   CBC with Differential ([facility] Center Only)    Standing Status:   Future    Expiration Date:   04/30/2025   The patient has a good understanding of the overall plan. she agrees with it. she will call with any problems that may develop before the next visit here. Total time spent: 30 mins including face to face time and time spent for planning, charting and co-ordination of care   Viinay K Leesa Leifheit, MD 04/30/24

## 2024-05-07 ENCOUNTER — Encounter: Payer: Self-pay | Admitting: Neurology

## 2024-05-07 ENCOUNTER — Ambulatory Visit: Admitting: Neurology

## 2024-05-07 VITALS — BP 141/66 | HR 64 | Ht 64.5 in | Wt 225.0 lb

## 2024-05-07 DIAGNOSIS — E669 Obesity, unspecified: Secondary | ICD-10-CM | POA: Diagnosis not present

## 2024-05-07 DIAGNOSIS — W19XXXS Unspecified fall, sequela: Secondary | ICD-10-CM

## 2024-05-07 DIAGNOSIS — R2689 Other abnormalities of gait and mobility: Secondary | ICD-10-CM

## 2024-05-07 DIAGNOSIS — R634 Abnormal weight loss: Secondary | ICD-10-CM

## 2024-05-07 DIAGNOSIS — G4733 Obstructive sleep apnea (adult) (pediatric): Secondary | ICD-10-CM | POA: Diagnosis not present

## 2024-05-07 NOTE — Progress Notes (Signed)
 Subjective:    Patient ID: Jocelyn Kaufman is a 68 y.o. female.  HPI    Interim history:   Jocelyn Kaufman is a 68 year old female with an underlying complex medical history hypertension, hyperlipidemia, hypothyroidism, migraine headaches, rheumatoid arthritis, asthma, history of angioedema, adrenal adenoma, anemia, Cushing's disease, atrial fibrillation, depression, diabetes, fibromyalgia, meralgia paresthetica, ulnar neuropathy on the L, history of PE, OSA, right femur fracture with ORIF in 2019, right quadricep tendon repair in November 2023, and obesity, who presents for evaluation of her obstructive sleep apnea.  The patient is unaccompanied today.  I had evaluated her for balance issues and falls in November 2023.  I had ordered a brain MRI with and without contrast but she did not have it done at the time.  Today, 05/07/2024: she reports overall feeling better.  She had her last neck surgery in late 2023 and had extensive rehab after that.  She is currently walking without a walking aid but still has decreased range of motion in the right leg.  She reports benefiting from her CPAP machine.  I reviewed her compliance data for the past month and she used her machine 27 out of 30 days with percent use days greater than 4 hours at 73%, indicating adequate compliance, average usage of 5 hours and 27 minutes, residual AHI borderline at 5.8/h with mostly obstructive events, leak acceptable with a 95th percentile at 13.8 L/min and a pressure of 11 cm with EPR of 3.  Her Epworth sleepiness score is 7/24.  She typically goes to bed around 10 or 11 PM and rise time is between 4 and 5 AM.  She has 1 cat in the household.  Sometimes the cat wakes her up.  She drinks caffeine  in the form of coffee, usually 2 cups in the mornings.  She tries to hydrate well with water.  She has had some medication changes in the past several months.  She does take Lasix  for lower extremity edema.  Of note, she had sleep testing in 2018.   Her baseline sleep study from 03/17/17 showed a total AHI of 15.1/hour, REM AHI of 34.8 /hour, supine AHI of 27.4/hour, O2 nadir of 85%.  She had a subsequent titration study on 04/02/2017.     The patient's allergies, current medications, family history, past medical history, past social history, past surgical history and problem list were reviewed and updated as appropriate.   Previously:   09/17/22: (She) reports having fallen 5 times since late July 2023.  I reviewed your office records including office visit note from 09/03/2022. She reported several falls in the past few months.  She felt that her balance was off.  I have recently see her for obstructive sleep apnea.  Her first fall in late July happened outside her home, on the back stairs, she fell off 1 or 2 steps, hurt her knee at the time, was on the ground for 20 to 30 minutes before someone could help her.  She did not lose consciousness, she did not hit her head at the time.  She denies any sudden onset of one-sided weakness or numbness or tingling or droopy face or slurring of speech, reports a diagnosis of TIA in 2018.  She has had no severe or recurrent headaches.   Please note, that the patient seemed frustrated especially in the first part of the visit, as I was taking her history. She looked away, rolled her eyes, she also paused for longer periods of time looking away,  she clearly seemed frustrated and gave short answers, sighing and puffing.  Since I sensed that she was unhappy for some reasonable frustrated, I offered her a cancellation of this appointment and the possibility of getting evaluated for her balance issues and falls by another provider, she declined. She clarified that she was not upset or frustrated, she did become tearful during this office visit, she wanted to proceed and maintain this appointment and further work-up through my care. I stepped out of the room and spoke separately to the intake CMA, who had triaged the  patient.  Again, the patient elected to proceed with the visit and declined my offer to cancel his appointment.    She had surgery to the right quadriceps tendon in late July 2023.  She fell 4 times since then.  She graduated from a walker to a cane and has completed physical therapy but stopped it after she was not making progress.  She was advised by her physical therapist to go back to using her walker until she felt stable.  She has fallen since using the cane.  She reports no lightheadedness, no warning, no vertigo type sensation, no one-sided weakness.  She does feel weaker in her legs.  She has pain in both knees, not currently wearing any braces any longer, the right knee brace did not fit after the surgery and was uncomfortable.   She is scheduled for additional surgery next week.  She was hospitalized in October with chest pain and was found to have a small PE.  I reviewed the hospital records.   She presented to the emergency room on 05/26/2022 after a fall.  I reviewed the emergency room records.  She reported that she lost her footing going down 2 steps in her backyard.  She had a CT scan of the right knee without contrast on 05/26/2022 and I reviewed the results: IMPRESSION: 1. Findings suggestive of a distal quadriceps tendon tear with small mildly displaced fracture fragments arising from the superior patella. 2. Soft tissue swelling at the anterior aspect of the knee with ill-defined fluid.   She had a CT scan of the lumbar spine without contrast on 05/26/2022 and I reviewed the results:   IMPRESSION: 1. No acute abnormality. 2. 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 3. Progressive moderate foraminal narrowing bilaterally at L3-4, L4-5 and L5-S1. 4. Stable slight retrolisthesis at L4-5 is stable.     She hydrates reasonably well with water, estimates that she drinks about 6 cups of water per day.  She does indicate  frequent urination.   She has previously seen Dr. Jenel in this office for paresthesias. She had blood work through your office on 08/06/2022 and I reviewed the results: CBC with differential and platelets showed hemoglobin below normal at 10, hematocrit below normal at 32.5, RBC below normal at 3.57.  CRP was normal at 0.07, CMP showed normal sodium at 140, potassium normal at 4.5, glucose elevated at 135, BUN mildly elevated at 27, creatinine normal at 0.98, alk phos normal at 69, ALT 36, AST normal at 26.  ESR was normal at 5.  She had blood work through your office on 06/15/2022 and I reviewed the results: Hepatitis B core antibody is nonreactive, hepatitis B surface antigen, and reactive, hepatitis C antibody IgG nonreactive.   She had a brain MRI and MR angiogram of the head without contrast through [facility] on 07/06/2018 and I reviewed the results: IMPRESSION:  1. No acute intracranial abnormality. Normal appearance of the brain for age. 2. 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 Otherwise normal intracranial MRA.      03/29/22 (SA): 68 year old right-handed woman with an underlying medical history of diabetes, Cushing's disease, hypothyroidism, hyperlipidemia, hypertension, depression, meralgia paresthetica, migraines, glaucoma, history of ulnar neuropathy, history of pericardial effusion, and severe obesity with a BMI of over 40, who reports having noticed an increase in her apnea scores on the ResMed app.  She has been consistent with her CPAP usage.  She uses nasal pillows with good success but her headgear has changed it does not sit as snugly as the previous style.  She has been up-to-date with her supplies.  She is still benefiting from treatment and has had some weight fluctuation over time.  She generally goes to bed anywhere between 9 PM and midnight and rise time is around 7 AM.  She drinks caffeine  in the form of coffee, about 2  cups/day.  She is single and lives alone.  She has nocturia about once per average night.  She is currently on an antibiotic for a wound on her left leg.  She is followed by wound care.    She was diagnosed with obstructive sleep apnea many years ago originally.  I had evaluated her in 2018 for sleep apnea but she has not been seen a our sleep clinic since 2019.  I reviewed your office note from 01/22/2022.  She had a baseline sleep study through our sleep lab on 03/17/2017 which showed an AHI of 15.1/h, REM AHI of 34.8/h, supine AHI of 27.4/h, O2 nadir of 85%.  Her BMI was 37.6 at the time.  She had a subsequent titration study on 04/02/2017, at which time she did well on CPAP of 7 cm. I reviewed her current CPAP compliance data from 03/17/2022, which is a total of 30 days, during which time she used her machine 29 days with percent use days greater than 4 hours at 97%, indicating excellent compliance with an average usage of 6 hours and 35 minutes, residual AHI elevated at 10.4/h, leak acceptable with the 95th percentile at 14.7 L/min on a pressure of 10 cm with EPR of 3. Her Epworth sleepiness score is 16 of 24, fatigue severity score is 46 out of 63.    10/10/18 (Dr. Jenel): <<Ms. Abruzzo is a 68 year old right-handed black female with a history of diabetes, dyslipidemia, and hypertension.  The patient was admitted to the hospital on 06 July 2018.  The patient began having symptoms the day before with dizziness, she was dizzy all day long and did not feel well.  The patient continued to have some dizziness the next day, at one point she had stood up to get a drink of water because she felt hot and then she fell over and fractured the humerus on the right.  From that point on, she began having a stuttering speech problem, word finding issues that persisted for 6 or 7 hours and then finally cleared.  The patient had some right-sided numbness and weakness, she went to the hospital and was admitted for a stroke  evaluation.  MRI of the brain was done and did not show evidence of a stroke.  The dizziness problem persisted for 3 or 4 weeks, and then was intermittent from that point but has gone away at this time.  The patient had MRA of the head that showed atherosclerotic changes in the carotid siphon on  the right, carotid Doppler study was unremarkable, 2D echocardiogram was unremarkable.  The patient was placed on aspirin , her LDL was elevated at 126.  The patient has since been started on Lipitor.  She comes to this office for an evaluation.  She checks her blood pressures at home, they generally run in the 135-140 range systolic.  The patient indicates that she has had dizziness in the past that has resolved, the last such event was in April 2018.>>   12/25/17 (MM): << Ms. Glade is a 68 year old female with a history of obstructive sleep apnea on CPAP.  She returns today for follow-up.  Her CPAP download indicates that she use her machine 30 out of 30 days for compliance of 100%.  She use her machine greater than 4 hours 29 out of 30 days for compliance of 97%.  On average she uses her machine 7 hours and 49 minutes.  Her residual AHI is 5.8 on 8 cm of water with EPR 3.  Her leak in the 95th percentile is 10.4 L/min.  She states that she has a hard time keeping her mask in place at night.  Reports that she may be interested in trying a new mask if possible.  She also notes that she has had weight fluctuations since December.  Reports that this could be a result of her Cushing syndrome.  She returns today for evaluation.>>   06/24/17 (SA): 68 year old right-handed woman with an underlying medical history of Cushing's disease, depression, diabetes with suboptimal control, hyperlipidemia, hypertension, migraines, meralgia paresthetica on the right side, and paresthesias, and obesity, who presents for follow-up consultation of her obstructive sleep apnea, after recent sleep study testing. The patient is unaccompanied today.  I first met her on 03/07/2017 at the request of her primary care physician, at which time she reported snoring and excessive daytime somnolence. She had a prior diagnosis of OSA with sleep study testing more than 10 years prior and had not been using CPAP for at least 8 years. I suggested we proceed with a sleep study. She had a baseline sleep study, followed by a CPAP titration study. I went over her test results with her in detail today. Baseline sleep study from 03/17/2017 showed a sleep efficiency of 78%, sleep latency of 58 minutes, REM latency was 115 minutes. She had an increased percentage of stage I sleep, slow-wave sleep was 10.3% and REM sleep was mildly increased at 28.6%. She had a total AHI of 15.1 per hour, REM AHI of 34.8 per hour, supine AHI of 27.4 per hour. Average oxygen saturation was 98%, nadir was 85%. She had no significant PLMS or EKG or EEG changes. Based on her test results I suggested she return for a full night CPAP titration study. She had this on 04/02/2017. Sleep efficiency was 78.9%, sleep latency 17.5 minutes, REM latency 202 minutes. She was fitted with medium nasal pillows and CPAP was titrated from 5 cm to 7 cm. On the final pressure her AHI was 0 per hour with supine non-REM sleep achieved an O2 nadir of 90%. She had an increased percentage of stage II sleep, slow-wave sleep was 13.8% and REM sleep was 16%. She had no significant PLMS. Based on her test results I prescribed CPAP therapy for home use at 7 cm.   I reviewed her CPAP compliance data from 05/24/2017 through 06/22/2017, which is a total of 30 days, during which time she used her machine 29 days with percent used days greater than 4  hours at 93%, indicating excellent compliance with an average usage of 7 hours and 32 minutes, residual AHI borderline at 5.9 per hour, leaked low with the 95th percentile at 4.2 L/m on a pressure of 7 cm with EPR of 3. Residual AHI appears to be primarily obstructive in nature. She  reports doing fairly well with her CPAP, she does notice some improvement in her sleep quality. She has had interim weight gain. At the time of her sleep study she weighed about 222 and current weight is about 232 pounds. She does report increase in weight, particularly secondary to stress and also not being able to exercise very much. She injured her left foot recently, second toe. She does notice an improvement in her sleep consolidation and daytime sluggishness, feels less foggy headed.      03/07/2017 (SA): (She) reports snoring and excessive daytime somnolence. I reviewed your office note from 02/27/2017, which you kindly included. Her Epworth Sleepiness Scale score is 9 out of 24, fatigue score is 61 out of 63. She reports a prior diagnosis of obstructive sleep apnea and had sleep study testing over 10 years ago. She has not been using CPAP for several years, perhaps for the past 8 years. She is a nonsmoker and drinks alcohol rarely. She drinks caffeine  in the form of coffee about 1 12 oz mugs per day. She is single, has no children, she lives alone. She is on medical leave currently.  She has a bedtime of 9-11 PM and WT is 6-8 AM. She denies RLS symptoms, but has numbness in the feet. She reports a family history of sleep apnea and one of her half-sisters and a nephew. She has nocturia about twice per average night and occasional morning headaches. She has her old CPAP machine but could no longer get supplies and her DME company went out of business.     Her Past Medical History Is Significant For: Past Medical History:  Diagnosis Date   Adrenal adenoma    Anemia    Angio-edema    Asthma    Atrial fibrillation (HCC)    Chronic kidney disease    stage II   Cushing disease (HCC)    per patient pseudo cushing   Depression    Diabetes mellitus without complication (HCC)    Fibromyalgia    Hyperlipidemia    Hypertension    Hypothyroidism    Meralgia paresthetica of right side 01/10/2017    Migraine    Pericardial effusion    Proteinuria    Rheumatoid arthritis (HCC)    Ulnar neuropathy at wrist, 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   Urticaria     Her Past Surgical History Is Significant For: Past Surgical History:  Procedure Laterality Date   ADENOIDECTOMY     APPENDECTOMY     biopsy Left 12/2021   shin   biospy Right 04/2021   R 5th digit   biospy Left 04/2021   foot plantar surface   colonoscopy and endoscopy  2021   CYST REMOVAL LEG Right    Removed from right thigh joint   EYE SURGERY Bilateral 09/2021   cataract   foot surgery Left 09/2021   Excision lesion plantar left foot   LIGAMENT REPAIR Right 09/26/2022   Procedure: LIGAMENT REPAIR/knee;  Surgeon: Cristy Bonner DASEN, MD;  Location: [facility] OR;  Service: Orthopedics;  Laterality: Right;   ORIF HUMERUS FRACTURE Right 07/09/2018   Procedure: OPEN REDUCTION INTERNAL FIXATION (ORIF) HUMERAL SHAFT FRACTURE;  Surgeon: Cristy Bonner DASEN, MD;  Location: [facility] OR;  Service: Orthopedics;  Laterality: Right;   QUADRICEPS TENDON REPAIR Right 09/26/2022   Procedure: REVISION REPAIR QUADRICEP TENDON;  Surgeon: Cristy Bonner DASEN, MD;  Location: [facility] OR;  Service: Orthopedics;  Laterality: Right;   TONSILLECTOMY     torn right knee tendon repair Right    2023   TOTAL VAGINAL HYSTERECTOMY     UMBILICAL HERNIA REPAIR      Her Family History Is Significant For: Family History  Problem Relation Age of Onset   Hypertension Mother    Stroke Mother    Kidney failure Mother    Heart disease Mother        heart murmur   Glaucoma Mother    Stroke Father    COPD Father    Cancer - Prostate Father    Colon cancer Father    Diabetes Sister    Thyroid  cancer Sister    Epilepsy Sister    Ovarian cancer Paternal Aunt     Her Social History Is Significant For: Social History   Socioeconomic History   Marital status: Single    Spouse name: Not on file   Number of children: 0   Years of education: Masters   Highest education level: Not on file   Occupational History   Occupation: [employer]  Tobacco Use   Smoking status: Never   Smokeless tobacco: Never  Vaping Use   Vaping status: Never Used  Substance and Sexual Activity   Alcohol use: Not Currently    Comment: occ   Drug use: No   Sexual activity: Not Currently  Other Topics Concern   Not on file  Social History Narrative   Lives alone in a one story home   Caffeine  use: 2 cups coffee per day   Tea sometimes   Soda-  once every other week   Right-handed   Retired.    Masters Degree   No Children   Single.   Social Drivers of Corporate investment banker Strain: Low Risk  (04/25/2023)   Overall Financial Resource Strain (CARDIA)    Difficulty of Paying Living Expenses: Not very hard  Food Insecurity: No Food Insecurity (04/25/2023)   Hunger Vital Sign    Worried About Running Out of Food in the Last Year: Never true    Ran Out of Food in the Last Year: Never true  Transportation Needs: No Transportation Needs (04/25/2023)   PRAPARE - Administrator, Civil Service (Medical): No    Lack of Transportation (Non-Medical): No  Physical Activity: Not on file  Stress: Stress Concern Present (04/25/2023)   [employer] of Occupational Health - Occupational Stress Questionnaire    Feeling of Stress : Rather much  Social Connections: Not on file    Her Allergies Are:  Allergies  Allergen Reactions   Semaglutide (0.25 Or 0.5mg -Dos) Other (See Comments)    Causes cataracts requiring surgery and impairing vision    Shellfish-Derived Products Anaphylaxis, Swelling and Other (See Comments)    Patient avoids   Empagliflozin Itching, Other (See Comments) and Swelling    UTI sx's and yeast infections   Semaglutide  Other (See Comments)    Other reaction(s): worsening DM RETINOPATHY   Tramadol Hcl Other (See Comments)    hallucinations   Canagliflozin Other (See Comments)    Urinary incontinence    Latex Rash and Other (See Comments)    Liraglutide Itching    Other reaction(s): itching all over   Minoxidil Rash  skin irritation   Omeprazole Rash and Other (See Comments)   Percocet [Oxycodone -Acetaminophen ] Nausea And Vomiting  :   Her Current Medications Are:  Outpatient Encounter Medications as of 05/07/2024  Medication Sig   ACCU-CHEK GUIDE test strip    Accu-Chek Softclix Lancets lancets use to check blood sugar   Aflibercept  (EYLEA ) 2 MG/0.05ML SOLN 2 mg by Intravitreal route every 3 (three) months.   albuterol  (VENTOLIN  HFA) 108 (90 Base) MCG/ACT inhaler Inhale two puffs every four to six hours as needed for cough or wheeze.   amLODipine  (NORVASC ) 5 MG tablet Take 1 tablet (5 mg total) by mouth daily.   aspirin  EC 81 MG tablet Take 81 mg by mouth daily. Swallow whole.   atorvastatin  (LIPITOR) 40 MG tablet Take 1 tablet (40 mg total) by mouth daily at 6 PM.   Azelaic Acid (FINACEA) 15 % FOAM Apply topically. Apply to scalp twice daily   CHELATED IRON PO Take 18 mg by mouth daily.   Cholecalciferol  (VITAMIN D3) 125 MCG (5000 UT) CAPS Take 5,000 Units by mouth daily.   clindamycin (CLEOCIN T) 1 % external solution Apply to underarm daily to twice as needed.   CLOBETASOL PROPIONATE E 0.05 % emollient cream Apply topically 3 (three) times daily.   dapagliflozin  propanediol (FARXIGA ) 5 MG TABS tablet Take 1 tablet (5 mg total) by mouth daily before breakfast.   Dulaglutide  (TRULICITY ) 1.5 MG/0.5ML SOAJ Inject 1.5 mg into the skin once a week.   Dulaglutide  (TRULICITY ) 1.5 MG/0.5ML SOAJ Inject 1.5 mg into the skin once a week.   Dulaglutide  (TRULICITY ) 1.5 MG/0.5ML SOAJ Inject 1.5 mg into the skin once a week.   EPINEPHrine  (EPIPEN  2-PAK) 0.3 mg/0.3 mL IJ SOAJ injection Use as directed for life-threatening allergic reaction.   epoetin  alfa-epbx (RETACRIT ) 2000 UNIT/ML injection See admin instructions.   escitalopram  (LEXAPRO ) 20 MG tablet Take 20 mg by mouth daily.   ezetimibe  (ZETIA ) 10 MG tablet Take 1 tablet (10 mg  total) by mouth daily.   fexofenadine  (ALLEGRA  ALLERGY) 180 MG tablet Take 1 tablet (180 mg total) by mouth daily.   furosemide  (LASIX ) 20 MG tablet Take one tablet daily for 2 days then as needed for fluid retention. If taking more than 3 times per week, please let cardiology team know.   hydrALAZINE  (APRESOLINE ) 50 MG tablet Take 50 mg by mouth 3 (three) times daily.   hydrocortisone cream 1 % Apply 1 Application topically 2 (two) times daily as needed for itching.   inFLIXimab  (REMICADE ) 100 MG injection Inject into the vein.   insulin  aspart (NOVOLOG ) 100 UNIT/ML FlexPen Inject into the skin See admin instructions. Sliding Scale   insulin  glargine, 2 Unit Dial, (TOUJEO  MAX SOLOSTAR) 300 UNIT/ML Solostar Pen Inject 42 Units into the skin at bedtime.   irbesartan  (AVAPRO ) 300 MG tablet Take 1 tablet (300 mg total) by mouth daily.   KERENDIA 10 MG TABS 10 mg daily.   Lancets Misc. (ACCU-CHEK SOFTCLIX LANCET DEV) KIT See admin instructions.   latanoprost  (XALATAN ) 0.005 % ophthalmic solution Place 1 drop into both eyes at bedtime.   leflunomide  (ARAVA ) 20 MG tablet Take 20 mg by mouth daily.   leflunomide  (ARAVA ) 20 MG tablet Take 1 tablet (20 mg total) by mouth daily.   lidocaine  (LMX) 4 % cream Apply 1 Application topically as needed (pain).   metoprolol  succinate (TOPROL -XL) 25 MG 24 hr tablet Take 1.5 tablets (37.5 mg total) by mouth daily. May take additional half tablet as needed  once daily for palpitations.   Multiple Vitamins-Minerals (CENTRUM SILVER  PO) Take 1 tablet by mouth daily with breakfast.   sulfaSALAzine  (AZULFIDINE ) 500 MG tablet Take 1 tablet (500 mg total) by mouth 2 (two) times daily.   SYNTHROID  125 MCG tablet Take 125 mcg by mouth every Monday, Tuesday, Wednesday, Thursday, and Friday.   triamcinolone  cream (KENALOG ) 0.1 % Apply 1 Application topically 2 (two) times daily as needed (for itching- affected areas).   Vibegron  (GEMTESA  PO) Take by mouth.   Vibegron  (GEMTESA )  75 MG TABS Take 1 tablet (75 mg total) by mouth daily.   vitamin B-12 (CYANOCOBALAMIN ) 1000 MCG tablet Take 1,000 mcg by mouth daily.   amLODipine  (NORVASC ) 10 MG tablet Take 1 tablet (10 mg total) by mouth daily. (Patient not taking: Reported on 05/07/2024)   B-D ULTRAFINE III SHORT PEN 31G X 8 MM MISC SMARTSIG:1 Each SUB-Q Daily   sulfaSALAzine  (AZULFIDINE ) 500 MG tablet Take 3 tablets (1,500 mg total) by mouth 2 (two) times daily. (Patient not taking: Reported on 05/07/2024)   No facility-administered encounter medications on file as of 05/07/2024.  :  Review of Systems:  Out of a complete 14 point review of systems, all are reviewed and negative with the exception of these symptoms as listed below:   Review of Systems  Neurological:        Patient is here alone for follow-up of falls and sleep apnea. She states she wasn't able to do any follow-up. She had a fall in 2023, tore tendon(s) and had surgery and inpatient rehab. She states all of 2024 was recover. She is now trying to follow-up especially with sleep apnea because it has been awhile since she has gotten supplies. She states she benefits from the cpap.     Objective:  Neurological Exam  Physical Exam Physical Examination:   Vitals:   05/07/24 1112  BP: (!) 141/66  Pulse: 64    General Examination: The patient is a very pleasant 68 y.o. female in no acute distress. She appears well-developed and well-nourished and well groomed.   HEENT: Normocephalic, atraumatic, pupils are equal and tracking is well-preserved, she has corrective eyeglasses in place.  Hearing is grossly intact, face is symmetric with normal facial animation.  Speech is clear without dysarthria, hypophonia or voice tremor.  Neck without difficulty in mobility, no carotid bruits. Oropharynx exam reveals: mild to moderate mouth dryness, adequate dental hygiene and moderate airway crowding. Tongue protrudes centrally and palate elevates symmetrically.  Neck  circumference 15-1/2 inches.   Chest: Clear to auscultation without wheezing, rhonchi or crackles noted.   Heart: S1+S2+0, regular and normal without murmurs, rubs or gallops noted.    Abdomen: Soft, non-tender and non-distended.   Extremities: There is trace edema in the distal lower extremities bilaterally.    Skin: Warm and dry without trophic changes noted.    Musculoskeletal: exam reveals decreased range of motion right leg.     Neurologically:  Mental status: The patient is awake, alert and oriented in all 4 spheres. Her immediate and remote memory, attention, language skills and fund of knowledge are appropriate. There is no evidence of aphasia, agnosia, apraxia or anomia. Speech is clear with normal prosody and enunciation. Thought process is linear. Mood is normal and affect is somewhat blunted.    Cranial nerves II - XII are as described above under HEENT exam.  Motor exam: Normal bulk, moving all 4 extremities with the exception of limitation of range of motion in the right  leg.  There is no obvious tremor.  Fine motor skills and coordination: grossly intact.  Cerebellar testing: No dysmetria or intention tremor. There is no truncal or gait ataxia. Sensory exam: intact to light touch in the upper and lower extremities.  Gait, station and balance: She stands with mild difficulty and walks slowly with a slight limp on the right.  No walking aid.     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  She has been on CPAP therapy at a pressure of 11 cm.  She should be eligible for new machine as sleep testing was in 2018.  She is commended for her treatment adherence, advised to continue with her current settings and on the current machine but we will go ahead and proceed with a home sleep test for reassessment of her sleep apnea as she had some weight fluctuation as well in the past few years.  She is agreeable to pursuing a home sleep test and agreeable to getting a new PAP machine.  We talked about her gait disturbance and falls again today.  She has had quadricep tendon surgery at the end of 2023 and extensive rehab afterwards.  She is on the upswing of things and has done better overall.  She also has a history of lumbar degenerative disc disease and lumbar spinal stenosis.  We will plan to follow-up after her home sleep test and after she has established treatment with a new PAP machine.  I answered all her questions today and she was in agreement with our plan. I spent 40 minutes in total face-to-face time and in reviewing records during pre-charting, more than 50% of which was spent in counseling and coordination of care, reviewing test results, reviewing medications and treatment regimen and/or in discussing or reviewing the diagnosis of fall with injury, OSA on CPAP, the prognosis and treatment options. Pertinent laboratory and imaging test results that were available during this visit with the patient were reviewed by me and considered in my medical decision making (see chart for details).

## 2024-05-07 NOTE — Patient Instructions (Signed)
 It was nice to see you again today.  I am glad to hear that you are doing better after your leg surgery in late 2023.  You are compliant with your CPAP and should be eligible for a new machine.  As discussed, I will order a home sleep test for reevaluation of your sleep apnea and we will call you to schedule this.  Remember not to use your CPAP the night of testing at home so we can get diagnostic data.  Continue to use your machine consistently otherwise.  We will plan to follow-up after testing and after you establish treatment with your new machine for a compliance visit, which is mandated by your insurance typically.

## 2024-05-13 ENCOUNTER — Encounter: Payer: Self-pay | Admitting: Podiatry

## 2024-05-13 ENCOUNTER — Ambulatory Visit: Admitting: Podiatry

## 2024-05-13 DIAGNOSIS — M79675 Pain in left toe(s): Secondary | ICD-10-CM | POA: Diagnosis not present

## 2024-05-13 DIAGNOSIS — E1142 Type 2 diabetes mellitus with diabetic polyneuropathy: Secondary | ICD-10-CM

## 2024-05-13 DIAGNOSIS — B351 Tinea unguium: Secondary | ICD-10-CM | POA: Diagnosis not present

## 2024-05-13 DIAGNOSIS — M79674 Pain in right toe(s): Secondary | ICD-10-CM

## 2024-05-14 DIAGNOSIS — G4733 Obstructive sleep apnea (adult) (pediatric): Secondary | ICD-10-CM

## 2024-05-15 ENCOUNTER — Ambulatory Visit

## 2024-05-15 DIAGNOSIS — R2689 Other abnormalities of gait and mobility: Secondary | ICD-10-CM

## 2024-05-15 DIAGNOSIS — R634 Abnormal weight loss: Secondary | ICD-10-CM

## 2024-05-15 DIAGNOSIS — W19XXXS Unspecified fall, sequela: Secondary | ICD-10-CM

## 2024-05-15 DIAGNOSIS — E669 Obesity, unspecified: Secondary | ICD-10-CM

## 2024-05-15 DIAGNOSIS — G4733 Obstructive sleep apnea (adult) (pediatric): Secondary | ICD-10-CM

## 2024-05-18 DIAGNOSIS — M0579 Rheumatoid arthritis with rheumatoid factor of multiple sites without organ or systems involvement: Secondary | ICD-10-CM | POA: Diagnosis not present

## 2024-05-18 DIAGNOSIS — E039 Hypothyroidism, unspecified: Secondary | ICD-10-CM | POA: Diagnosis not present

## 2024-05-18 DIAGNOSIS — E1122 Type 2 diabetes mellitus with diabetic chronic kidney disease: Secondary | ICD-10-CM | POA: Diagnosis not present

## 2024-05-18 NOTE — Progress Notes (Signed)
 Subjective:  Patient ID: Jocelyn Kaufman, female    DOB: 1956/07/09,  MRN: [id number]  Jocelyn Kaufman presents to clinic today for at risk foot care with history of diabetic neuropathy and painful thick toenails that are difficult to trim. Pain interferes with ambulation. Aggravating factors include wearing enclosed shoe gear. Pain is relieved with periodic professional debridement.  Chief Complaint  Patient presents with   Dfc    Rm15 Diabetic A1c 6.2 DR. Pharr  April 2025   New problem(s): None.   PCP is Clarice Nottingham, MD.  Allergies  Allergen Reactions   Semaglutide (0.25 Or 0.5mg -Dos) Other (See Comments)    Causes cataracts requiring surgery and impairing vision    Shellfish-Derived Products Anaphylaxis, Swelling and Other (See Comments)    Patient avoids   Empagliflozin Itching, Other (See Comments) and Swelling    UTI sx's and yeast infections   Semaglutide  Other (See Comments)    Other reaction(s): worsening DM RETINOPATHY   Tramadol Hcl Other (See Comments)    hallucinations   Canagliflozin Other (See Comments)    Urinary incontinence    Latex Rash and Other (See Comments)   Liraglutide Itching    Other reaction(s): itching all over   Minoxidil Rash    skin irritation   Omeprazole Rash and Other (See Comments)   Percocet [Oxycodone -Acetaminophen ] Nausea And Vomiting    Review of Systems: Negative except as noted in the HPI.  Objective: No changes noted in today's physical examination. There were no vitals filed for this visit. Jocelyn Kaufman is a pleasant 68 y.o. female in NAD. AAO x 3.  Vascular Examination: Capillary refill time immediate b/l. Vascular status intact b/l with palpable pedal pulses. Pedal hair present b/l. No pain with calf compression b/l. Skin temperature gradient WNL b/l. No cyanosis or clubbing b/l. No ischemia or gangrene noted b/l.   Neurological Examination: Sensation grossly intact b/l with 10 gram monofilament. Vibratory sensation intact  b/l. Pt has subjective symptoms of neuropathy.  Dermatological Examination: Pedal skin with normal turgor, texture and tone b/l.  No open wounds. No interdigital macerations.   Toenails 1-5 b/l thick, discolored, elongated with subungual debris and pain on dorsal palpation.   No corns, calluses nor porokeratotic lesions noted.  Musculoskeletal Examination: Muscle strength 5/5 to all lower extremity muscle groups bilaterally. 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  Radiographs: None  Assessment/Plan: 1. Pain due to onychomycosis of toenails of both feet   2. Diabetic peripheral neuropathy associated with type 2 diabetes mellitus [facility])     Consent given for treatment. Patient examined. All patient's and/or POA's questions/concerns addressed on today's visit. Mycotic toenails 1-5 debrided in length and girth without incident. Continue foot and shoe inspections daily. Monitor blood glucose per PCP/Endocrinologist's recommendations.Continue soft, supportive shoe gear daily. Report any pedal injuries to medical professional. Call office if there are any quesitons/concerns. -Patient/POA to call should there be question/concern in the interim.   Return in about 3 months (around 08/13/2024).  Delon LITTIE Merlin, DPM      [place] LOCATION: [address] [place] [address]                   Office [phone]  [place] LOCATION: [address] [place], [place] [address] Office [phone]

## 2024-05-25 ENCOUNTER — Other Ambulatory Visit (HOSPITAL_COMMUNITY): Payer: Self-pay

## 2024-05-25 ENCOUNTER — Other Ambulatory Visit (HOSPITAL_BASED_OUTPATIENT_CLINIC_OR_DEPARTMENT_OTHER): Payer: Self-pay

## 2024-05-25 DIAGNOSIS — E1122 Type 2 diabetes mellitus with diabetic chronic kidney disease: Secondary | ICD-10-CM | POA: Diagnosis not present

## 2024-05-25 DIAGNOSIS — E782 Mixed hyperlipidemia: Secondary | ICD-10-CM | POA: Diagnosis not present

## 2024-05-25 DIAGNOSIS — H3523 Other non-diabetic proliferative retinopathy, bilateral: Secondary | ICD-10-CM | POA: Diagnosis not present

## 2024-05-25 DIAGNOSIS — I10 Essential (primary) hypertension: Secondary | ICD-10-CM | POA: Diagnosis not present

## 2024-05-25 DIAGNOSIS — N1832 Chronic kidney disease, stage 3b: Secondary | ICD-10-CM | POA: Diagnosis not present

## 2024-05-25 DIAGNOSIS — E1121 Type 2 diabetes mellitus with diabetic nephropathy: Secondary | ICD-10-CM | POA: Diagnosis not present

## 2024-05-25 DIAGNOSIS — E039 Hypothyroidism, unspecified: Secondary | ICD-10-CM | POA: Diagnosis not present

## 2024-05-25 MED ORDER — SYNTHROID 125 MCG PO TABS
125.0000 ug | ORAL_TABLET | Freq: Every morning | ORAL | 1 refills | Status: DC
  Filled 2024-05-25: qty 90, 90d supply, fill #0

## 2024-05-28 ENCOUNTER — Inpatient Hospital Stay

## 2024-05-28 VITALS — BP 127/53 | HR 66 | Temp 99.0°F | Resp 20 | Wt 231.8 lb

## 2024-05-28 DIAGNOSIS — Z794 Long term (current) use of insulin: Secondary | ICD-10-CM | POA: Diagnosis not present

## 2024-05-28 DIAGNOSIS — N1831 Chronic kidney disease, stage 3a: Secondary | ICD-10-CM | POA: Diagnosis not present

## 2024-05-28 DIAGNOSIS — D631 Anemia in chronic kidney disease: Secondary | ICD-10-CM

## 2024-05-28 DIAGNOSIS — D649 Anemia, unspecified: Secondary | ICD-10-CM

## 2024-05-28 DIAGNOSIS — E1122 Type 2 diabetes mellitus with diabetic chronic kidney disease: Secondary | ICD-10-CM | POA: Diagnosis not present

## 2024-05-28 DIAGNOSIS — D638 Anemia in other chronic diseases classified elsewhere: Secondary | ICD-10-CM

## 2024-05-28 LAB — CBC WITH DIFFERENTIAL (CANCER CENTER ONLY)
Abs Immature Granulocytes: 0.02 K/uL (ref 0.00–0.07)
Basophils Absolute: 0 K/uL (ref 0.0–0.1)
Basophils Relative: 1 %
Eosinophils Absolute: 0.1 K/uL (ref 0.0–0.5)
Eosinophils Relative: 2 %
HCT: 31.7 % — ABNORMAL LOW (ref 36.0–46.0)
Hemoglobin: 9.6 g/dL — ABNORMAL LOW (ref 12.0–15.0)
Immature Granulocytes: 0 %
Lymphocytes Relative: 24 %
Lymphs Abs: 1.5 K/uL (ref 0.7–4.0)
MCH: 27.2 pg (ref 26.0–34.0)
MCHC: 30.3 g/dL (ref 30.0–36.0)
MCV: 89.8 fL (ref 80.0–100.0)
Monocytes Absolute: 0.6 K/uL (ref 0.1–1.0)
Monocytes Relative: 10 %
Neutro Abs: 3.8 K/uL (ref 1.7–7.7)
Neutrophils Relative %: 63 %
Platelet Count: 141 K/uL — ABNORMAL LOW (ref 150–400)
RBC: 3.53 MIL/uL — ABNORMAL LOW (ref 3.87–5.11)
RDW: 15.2 % (ref 11.5–15.5)
WBC Count: 6.1 K/uL (ref 4.0–10.5)
nRBC: 0.3 % — ABNORMAL HIGH (ref 0.0–0.2)

## 2024-05-28 MED ORDER — EPOETIN ALFA-EPBX 40000 UNIT/ML IJ SOLN
40000.0000 [IU] | Freq: Once | INTRAMUSCULAR | Status: AC
  Administered 2024-05-28: 40000 [IU] via SUBCUTANEOUS
  Filled 2024-05-28: qty 1

## 2024-06-03 DIAGNOSIS — R42 Dizziness and giddiness: Secondary | ICD-10-CM | POA: Diagnosis not present

## 2024-06-03 DIAGNOSIS — H903 Sensorineural hearing loss, bilateral: Secondary | ICD-10-CM | POA: Diagnosis not present

## 2024-06-12 NOTE — Addendum Note (Signed)
 Addended by: Cletus Mehlhoff on: 06/12/2024 12:00 PM   Modules accepted: Orders

## 2024-06-12 NOTE — Telephone Encounter (Signed)
PSG order placed.

## 2024-06-12 NOTE — Telephone Encounter (Signed)
 Supply order placed.

## 2024-06-12 NOTE — Addendum Note (Signed)
 Addended by: Corde Antonini on: 06/12/2024 11:44 AM   Modules accepted: Orders

## 2024-06-12 NOTE — Telephone Encounter (Signed)
 Patient has attempted 2 [name] HST's with no conclusive information. Pt is willing to come in for an inlab sleep study. I have reserved an appt for the patient until we can check on auth with insurance. Appt saved on 9/7 at 8pm.   Dr Buck, can you place an order for a NPSG for this patient. And then Dam[facility] will be able to start the auth process. Thanks!!

## 2024-06-13 ENCOUNTER — Emergency Department (HOSPITAL_COMMUNITY)

## 2024-06-13 ENCOUNTER — Other Ambulatory Visit (HOSPITAL_COMMUNITY): Payer: Self-pay

## 2024-06-13 ENCOUNTER — Other Ambulatory Visit: Payer: Self-pay

## 2024-06-13 ENCOUNTER — Emergency Department (HOSPITAL_COMMUNITY): Admission: EM | Admit: 2024-06-13 | Discharge: 2024-06-13 | Disposition: A | Discharge status: Home / Self Care

## 2024-06-13 ENCOUNTER — Encounter (HOSPITAL_COMMUNITY): Payer: Self-pay | Admitting: Pharmacy Technician

## 2024-06-13 DIAGNOSIS — S92414A Nondisplaced fracture of proximal phalanx of right great toe, initial encounter for closed fracture: Secondary | ICD-10-CM | POA: Insufficient documentation

## 2024-06-13 DIAGNOSIS — S92124A Nondisplaced fracture of body of right talus, initial encounter for closed fracture: Secondary | ICD-10-CM | POA: Diagnosis not present

## 2024-06-13 DIAGNOSIS — M7731 Calcaneal spur, right foot: Secondary | ICD-10-CM | POA: Diagnosis not present

## 2024-06-13 DIAGNOSIS — M25561 Pain in right knee: Secondary | ICD-10-CM | POA: Diagnosis not present

## 2024-06-13 DIAGNOSIS — Z91040 Latex allergy status: Secondary | ICD-10-CM | POA: Insufficient documentation

## 2024-06-13 DIAGNOSIS — S92154A Nondisplaced avulsion fracture (chip fracture) of right talus, initial encounter for closed fracture: Secondary | ICD-10-CM

## 2024-06-13 DIAGNOSIS — W010XXA Fall on same level from slipping, tripping and stumbling without subsequent striking against object, initial encounter: Secondary | ICD-10-CM | POA: Diagnosis not present

## 2024-06-13 DIAGNOSIS — M19071 Primary osteoarthritis, right ankle and foot: Secondary | ICD-10-CM | POA: Diagnosis not present

## 2024-06-13 DIAGNOSIS — Z7982 Long term (current) use of aspirin: Secondary | ICD-10-CM | POA: Diagnosis not present

## 2024-06-13 DIAGNOSIS — S92151A Displaced avulsion fracture (chip fracture) of right talus, initial encounter for closed fracture: Secondary | ICD-10-CM | POA: Diagnosis not present

## 2024-06-13 DIAGNOSIS — E039 Hypothyroidism, unspecified: Secondary | ICD-10-CM | POA: Diagnosis not present

## 2024-06-13 DIAGNOSIS — S92424A Nondisplaced fracture of distal phalanx of right great toe, initial encounter for closed fracture: Secondary | ICD-10-CM | POA: Insufficient documentation

## 2024-06-13 DIAGNOSIS — N189 Chronic kidney disease, unspecified: Secondary | ICD-10-CM | POA: Insufficient documentation

## 2024-06-13 DIAGNOSIS — M25461 Effusion, right knee: Secondary | ICD-10-CM | POA: Diagnosis not present

## 2024-06-13 DIAGNOSIS — J45909 Unspecified asthma, uncomplicated: Secondary | ICD-10-CM | POA: Diagnosis not present

## 2024-06-13 DIAGNOSIS — Z794 Long term (current) use of insulin: Secondary | ICD-10-CM | POA: Insufficient documentation

## 2024-06-13 DIAGNOSIS — S92511A Displaced fracture of proximal phalanx of right lesser toe(s), initial encounter for closed fracture: Secondary | ICD-10-CM | POA: Diagnosis not present

## 2024-06-13 DIAGNOSIS — S99921A Unspecified injury of right foot, initial encounter: Secondary | ICD-10-CM | POA: Diagnosis present

## 2024-06-13 DIAGNOSIS — I129 Hypertensive chronic kidney disease with stage 1 through stage 4 chronic kidney disease, or unspecified chronic kidney disease: Secondary | ICD-10-CM | POA: Insufficient documentation

## 2024-06-13 DIAGNOSIS — M25571 Pain in right ankle and joints of right foot: Secondary | ICD-10-CM

## 2024-06-13 DIAGNOSIS — M85871 Other specified disorders of bone density and structure, right ankle and foot: Secondary | ICD-10-CM | POA: Diagnosis not present

## 2024-06-13 DIAGNOSIS — M79671 Pain in right foot: Secondary | ICD-10-CM

## 2024-06-13 DIAGNOSIS — M1711 Unilateral primary osteoarthritis, right knee: Secondary | ICD-10-CM | POA: Diagnosis not present

## 2024-06-13 MED ORDER — HYDROCODONE-ACETAMINOPHEN 5-325 MG PO TABS
1.0000 | ORAL_TABLET | ORAL | 0 refills | Status: AC | PRN
  Filled 2024-06-13: qty 8, 2d supply, fill #0

## 2024-06-13 NOTE — ED Triage Notes (Signed)
 Pt here POV after tripping and falling on Thursday. Pt states she twisted her R ankle. Swelling to R ankle and R foot.

## 2024-06-13 NOTE — ED Provider Notes (Signed)
 [facility] AT [facility] Provider Note   CSN: [id number] Arrival date & time: 06/13/24  9182     Patient presents with: Ankle Pain   Jocelyn Kaufman is a 68 y.o. female presents emergency department with a chief complaint of mechanical fall on Thursday where patient injured her right ankle and right foot.  Patient has attempted to continue to walk with walker and cane at home however is having significant discomfort so she decided to come have it checked out.  Denies head or neck injury.  Does also appreciate some mild right knee pain however she has had a previous quadriceps surgery.  Past medical history significant for asthma, diabetes, hypertension, hyperlipidemia, rheumatoid arthritis, falls, TIA, chronic kidney disease, osteoarthritis, fibromyalgia, generalized anxiety disorder, hypothyroidism, etc.    Ankle Pain      Prior to Admission medications   Medication Sig Start Date End Date Taking? Authorizing Provider  HYDROcodone -acetaminophen  (NORCO/VICODIN) 5-325 MG tablet Take 1 tablet by mouth every 4 (four) hours as needed for moderate pain (pain score 4-6) or severe pain (pain score 7-10). 06/13/24  Yes Arielys Wandersee, [place] FALCON, PA-C  ACCU-CHEK GUIDE test strip  11/16/22   [provider]  Accu-Chek Softclix Lancets lancets use to check blood sugar 04/26/21   [provider]  Aflibercept  (EYLEA ) 2 MG/0.05ML SOLN 2 mg by Intravitreal route every 3 (three) months. 04/25/20   Odean Potts, MD  albuterol  (VENTOLIN  HFA) 108 (90 Base) MCG/ACT inhaler Inhale two puffs every four to six hours as needed for cough or wheeze. 10/05/21   Jeneal Danita Macintosh, MD  amLODipine  (NORVASC ) 10 MG tablet Take 1 tablet (10 mg total) by mouth daily. Patient not taking: Reported on 05/13/2024 02/01/22   Gudena, Vinay, MD  amLODipine  (NORVASC ) 5 MG tablet Take 1 tablet (5 mg total) by mouth daily. 04/30/24     aspirin  EC 81 MG tablet Take 81 mg by mouth daily.  Swallow whole.    [provider]  atorvastatin  (LIPITOR) 40 MG tablet Take 1 tablet (40 mg total) by mouth daily at 6 PM. 07/12/18   Rosario Leatrice FERNS, MD  Azelaic Acid (FINACEA) 15 % FOAM Apply topically. Apply to scalp twice daily    [provider]  B-D ULTRAFINE III SHORT PEN 31G X 8 MM MISC SMARTSIG:1 Each SUB-Q Daily 02/12/20   [provider]  CHELATED IRON PO Take 18 mg by mouth daily.    [provider]  Cholecalciferol  (VITAMIN D3) 125 MCG (5000 UT) CAPS Take 5,000 Units by mouth daily.    [provider]  clindamycin (CLEOCIN T) 1 % external solution Apply to underarm daily to twice as needed. 05/27/23   [provider]  CLOBETASOL PROPIONATE E 0.05 % emollient cream Apply topically 3 (three) times daily. 06/05/23   [provider]  dapagliflozin  propanediol (FARXIGA ) 5 MG TABS tablet Take 1 tablet (5 mg total) by mouth daily before breakfast. 10/08/23   Odean Potts, MD  Dulaglutide  (TRULICITY ) 1.5 MG/0.5ML SOAJ Inject 1.5 mg into the skin once a week. 11/06/23     Dulaglutide  (TRULICITY ) 1.5 MG/0.5ML SOAJ Inject 1.5 mg into the skin once a week. 11/06/23     Dulaglutide  (TRULICITY ) 1.5 MG/0.5ML SOAJ Inject 1.5 mg into the skin once a week. 04/03/24     EPINEPHrine  (EPIPEN  2-PAK) 0.3 mg/0.3 mL IJ SOAJ injection Use as directed for life-threatening allergic reaction. 04/05/22   Jeneal Danita Macintosh, MD  epoetin  alfa-epbx (RETACRIT ) 2000 UNIT/ML  injection See admin instructions.    [provider]  escitalopram  (LEXAPRO ) 20 MG tablet Take 20 mg by mouth daily.    [provider]  ezetimibe  (ZETIA ) 10 MG tablet Take 1 tablet (10 mg total) by mouth daily. 04/25/20   Gudena, Vinay, MD  fexofenadine  (ALLEGRA  ALLERGY) 180 MG tablet Take 1 tablet (180 mg total) by mouth daily. 11/21/21   Gudena, Vinay, MD  furosemide  (LASIX ) 20 MG tablet Take one tablet daily for 2 days then as needed for fluid retention. If taking more  than 3 times per week, please let cardiology team know. 04/03/24   Vannie Reche RAMAN, NP  hydrALAZINE  (APRESOLINE ) 50 MG tablet Take 50 mg by mouth 3 (three) times daily.    [provider]  hydrocortisone cream 1 % Apply 1 Application topically 2 (two) times daily as needed for itching.    [provider]  inFLIXimab  (REMICADE ) 100 MG injection Inject into the vein.    [provider]  insulin  aspart (NOVOLOG ) 100 UNIT/ML FlexPen Inject into the skin See admin instructions. Sliding Scale    [provider]  insulin  glargine, 2 Unit Dial, (TOUJEO  MAX SOLOSTAR) 300 UNIT/ML Solostar Pen Inject 42 Units into the skin at bedtime.    [provider]  irbesartan  (AVAPRO ) 300 MG tablet Take 1 tablet (300 mg total) by mouth daily. 02/01/22   Gudena, Vinay, MD  KERENDIA 10 MG TABS 10 mg daily. 05/14/23   [provider]  Lancets Misc. (ACCU-CHEK SOFTCLIX LANCET DEV) KIT See admin instructions. 04/25/21   [provider]  latanoprost  (XALATAN ) 0.005 % ophthalmic solution Place 1 drop into both eyes at bedtime.    [provider]  leflunomide  (ARAVA ) 20 MG tablet Take 20 mg by mouth daily.    [provider]  leflunomide  (ARAVA ) 20 MG tablet Take 1 tablet (20 mg total) by mouth daily. 04/17/24     lidocaine  (LMX) 4 % cream Apply 1 Application topically as needed (pain).    [provider]  metoprolol  succinate (TOPROL -XL) 25 MG 24 hr tablet Take 1.5 tablets (37.5 mg total) by mouth daily. May take additional half tablet as needed once daily for palpitations. 04/03/24   Vannie Reche RAMAN, NP  Multiple Vitamins-Minerals (CENTRUM SILVER  PO) Take 1 tablet by mouth daily with breakfast.    [provider]  sulfaSALAzine  (AZULFIDINE ) 500 MG tablet Take 3 tablets (1,500 mg total) by mouth 2 (two) times daily. Patient not taking: Reported on 05/13/2024 11/21/21   Gudena, Vinay, MD  sulfaSALAzine  (AZULFIDINE ) 500 MG tablet Take 1  tablet (500 mg total) by mouth 2 (two) times daily. 04/16/24     SYNTHROID  125 MCG tablet Take 125 mcg by mouth every Monday, Tuesday, Wednesday, Thursday, and Friday.    [provider]  SYNTHROID  125 MCG tablet Take 1 tablet (125 mcg total) by mouth in the morning on an empty stomach. 05/25/24     triamcinolone  cream (KENALOG ) 0.1 % Apply 1 Application topically 2 (two) times daily as needed (for itching- affected areas). 08/03/22   [provider]  Vibegron  (GEMTESA  PO) Take by mouth.    [provider]  Vibegron  (GEMTESA ) 75 MG TABS Take 1 tablet (75 mg total) by mouth daily. 04/27/24     vitamin B-12 (CYANOCOBALAMIN ) 1000 MCG tablet Take 1,000 mcg by mouth daily.    [provider]    Allergies: Semaglutide (0.25 or 0.5mg -dos), Shellfish-derived products, Empagliflozin, Semaglutide , Tramadol hcl, Canagliflozin, Latex, Liraglutide, Minoxidil, Omeprazole,  and Percocet [oxycodone -acetaminophen ]    Review of Systems  Musculoskeletal:        Right foot and ankle pain    Updated Vital Signs BP 136/65 (BP Location: Left Arm)   Pulse 78   Temp 98.9 F (37.2 C) (Oral)   SpO2 97%   Physical Exam Vitals and nursing note reviewed.  Constitutional:      General: She is awake. She is not in acute distress.    Appearance: Normal appearance. She is not ill-appearing, toxic-appearing or diaphoretic.  HENT:     Head: Normocephalic and atraumatic.  Eyes:     General: No scleral icterus. Pulmonary:     Effort: Pulmonary effort is normal. No respiratory distress.  Musculoskeletal:     Cervical back: Normal range of motion.     Comments: Right medial knee mildly tender to palpation, flexion and extension of the knee at baseline however patient has limited extension due to previous quadricep surgery  Patient right ankle generalized tenderness to palpation, flexion and extension actively intact with discomfort  Patient midfoot generalized tenderness palpation,  swelling present to midfoot as well as ankle  Great toe right foot very tender to palpation, limited range of motion of the right great toe, however other toes on right foot nontender to palpation, patient able to flex and extend without significant discomfort  Right lower extremity neurovascularly intact  Skin:    General: Skin is warm.     Capillary Refill: Capillary refill takes less than 2 seconds.     Comments: Swelling present to right foot and ankle, no appreciated bruising or color changes  Neurological:     General: No focal deficit present.     Mental Status: She is alert and oriented to person, place, and time.  Psychiatric:        Mood and Affect: Mood normal.        Behavior: Behavior normal. Behavior is cooperative.     (all labs ordered are listed, but only abnormal results are displayed) Labs Reviewed - No data to display  EKG: None  Radiology: DG Knee Complete 4 Views Right Result Date: 06/13/2024 CLINICAL DATA:  Right knee pain, fell EXAM: RIGHT KNEE - COMPLETE 4+ VIEW COMPARISON:  09/26/2022 FINDINGS: Frontal, bilateral oblique, and cross-table lateral views of the right knee are obtained. No acute displaced fracture, subluxation, or dislocation. Slight patella baja configuration likely as a result of prior quadriceps tendon injury. Moderate osteoarthritis in the patellofemoral compartment, which has progressed since prior study. 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 Soft tissues are otherwise unremarkable. IMPRESSION: 1. No acute bony abnormality. 2. Slight patella baja configuration as result of previous quadriceps tendon injury. 3. Progressive patellofemoral compartmental osteoarthritis. 4. Small suprapatellar joint effusion. Electronically Signed   By: Ozell Daring M.D.   On: 06/13/2024 10:12   DG Foot Complete Right Result Date: 06/13/2024 CLINICAL DATA:   Right foot and ankle pain, fell 2 days ago EXAM: RIGHT ANKLE - COMPLETE 3+ VIEW; RIGHT FOOT COMPLETE - 3+ VIEW COMPARISON:  None Available. FINDINGS: Right ankle: Frontal, oblique, and lateral views of the right ankle are obtained. There is a small os ossific density along the dorsal distal margin of the talus, only seen on the lateral view, which may reflect a small avulsion fracture. No other acute fracture, subluxation, or dislocation. There is moderate osteoarthritis throughout the ankle and hindfoot. Large superior and inferior calcaneal spurs are noted.  There is diffuse soft tissue edema, greatest anteriorly and laterally. Right foot: Frontal, oblique, and lateral views are obtained. There are minimally displaced intra-articular fractures along the medial aspect of the base of the first proximal and distal phalanges, with overlying soft tissue swelling. No other acute displaced fractures. Diffuse osteoarthritis greatest throughout the interphalangeal joints. Diffuse soft tissue swelling of the right foot. IMPRESSION: 1. Minimally displaced intra-articular fractures at the medial margins of the base of the first proximal and distal phalanges. 2. Small avulsion fracture off the dorsal distal margin of the talus. 3. Multifocal osteoarthritis, greatest within the interphalangeal joints, hindfoot, and ankle. 4. Diffuse soft tissue swelling, greatest at the anterolateral aspect of the ankle and first digit. Electronically Signed   By: Ozell Daring M.D.   On: 06/13/2024 09:17   DG Ankle Complete Right Result Date: 06/13/2024 CLINICAL DATA:  Right foot and ankle pain, fell 2 days ago EXAM: RIGHT ANKLE - COMPLETE 3+ VIEW; RIGHT FOOT COMPLETE - 3+ VIEW COMPARISON:  None Available. FINDINGS: Right ankle: Frontal, oblique, and lateral views of the right ankle are obtained. There is a small os ossific density along the dorsal distal margin of the talus, only seen on the lateral view, which may reflect a small avulsion  fracture. No other acute fracture, subluxation, or dislocation. There is moderate osteoarthritis throughout the ankle and hindfoot. Large superior and inferior calcaneal spurs are noted. There is diffuse soft tissue edema, greatest anteriorly and laterally. Right foot: Frontal, oblique, and lateral views are obtained. There are minimally displaced intra-articular fractures along the medial aspect of the base of the first proximal and distal phalanges, with overlying soft tissue swelling. No other acute displaced fractures. Diffuse osteoarthritis greatest throughout the interphalangeal joints. Diffuse soft tissue swelling of the right foot. IMPRESSION: 1. Minimally displaced intra-articular fractures at the medial margins of the base of the first proximal and distal phalanges. 2. Small avulsion fracture off the dorsal distal margin of the talus. 3. Multifocal osteoarthritis, greatest within the interphalangeal joints, hindfoot, and ankle. 4. Diffuse soft tissue swelling, greatest at the anterolateral aspect of the ankle and first digit. Electronically Signed   By: Ozell Daring M.D.   On: 06/13/2024 09:17     Procedures   Medications Ordered in the ED - No data to display                                  Medical Decision Making Amount and/or Complexity of Data Reviewed Radiology: ordered.  Risk Prescription drug management.   Patient presents to the ED for concern of fall, right ankle and foot pain, this involves an extensive number of treatment options, and is a complaint that carries with it a high risk of complications and morbidity.  The differential diagnosis includes fracture, sprain, DVT, soft tissue injury, etc.   Co morbidities that complicate the patient evaluation  asthma, diabetes, hypertension, hyperlipidemia, rheumatoid arthritis, falls, TIA, chronic kidney disease, osteoarthritis, fibromyalgia, generalized anxiety disorder, hypothyroidism   Imaging Studies ordered:  I  ordered imaging studies including x-rays of right foot, right ankle, right knee I independently visualized and interpreted imaging which showed: X-rays of right knee showed small joint effusion however no other acute abnormalities including fracture or dislocation X-rays of the right foot and ankle: Intra-articular fracture of proximal and distal phalanges of right great toe, avulsion fracture of talus of right foot, diffuse osteoarthritis, diffuse soft tissue swelling I  agree with the radiologist interpretation   Medicines ordered and prescription drug management:  I ordered medication including Norco for pain outpatient Reevaluation of the patient after these medicines showed that the patient stayed the same I have reviewed the patients home medicines and have made adjustments as needed   Test Considered:  None   Critical Interventions:  None   Problem List / ED Course:  68 year old female, mechanical fall, not on blood thinners, right ankle and right foot pain, patient ambulatory with walker and cane however some discomfort Vital signs stable On physical exam diffusely swollen and tender right foot, flexion extension intact at the ankle, denies calf pain, pain worse over right great toe and midfoot area Imaging today significant for intra-articular fractures of the proximal and distal right great toe as well as an avulsion fracture of the talus, patient updated on these findings, no acute abnormality related to R knee  Patient prescribed cam boot, given option for crutches however patient states that she feels she cannot use crutches effectively and has opted for continuing to use her cane/walker, patient does live at home alone but feels that she can take care of herself as she has been doing it for the last few days without issue despite her injury  Given short prescription of Norco outpatient for breakthrough pain, instructed to take over-the-counter Tylenol  with max every  limiting 4000 mg/day, did not give anti-inflammatory medication as patient has chronic kidney disease, recommended RICE therapy, given outpatient referral to orthopedics, patient instructed to call as soon as possible for appointment Return precautions given Patient discharged Most likely diagnosis at this time is ongoing sequela of mechanical fall resulting in proximal and distal phalanges fracture of right great toe as well as avulsion fracture of talus, symptomatic treatment given as well as orthopedic referral   Reevaluation:  After the interventions noted above, I reevaluated the patient and found that they have :stayed the same   Social Determinants of Health:  none   Dispostion:  After consideration of the diagnostic results and the patients response to treatment, I feel that the patent would benefit from discharge and outpatient therapy as prescribed.  Recommend follow-up with orthopedics as soon as possible for ongoing diagnosis and treatment.    Instructed patient to primary care provider aware of workup today and findings.     Final diagnoses:  Closed nondisplaced fracture of proximal phalanx of right great toe, initial encounter  Closed nondisplaced fracture of distal phalanx of right great toe, initial encounter  Closed nondisplaced avulsion fracture of right talus, initial encounter  Acute right ankle pain  Right foot pain    ED Discharge Orders          Ordered    HYDROcodone -acetaminophen  (NORCO/VICODIN) 5-325 MG tablet  Every 4 hours PRN        06/13/24 1046               Corley Kohls F, PA-C 06/13/24 1705    Gennaro Bouchard L, DO 06/15/24 2131

## 2024-06-13 NOTE — Progress Notes (Signed)
 Orthopedic Tech Progress Note Patient Details:  Jocelyn Kaufman May 10, 1956 [id number]  Ortho Devices Type of Ortho Device: CAM walker Ortho Device/Splint Location: RLE Ortho Device/Splint Interventions: Ordered, Application, Adjustment   Post Interventions Patient Tolerated: Well Instructions Provided: Adjustment of device, Care of device  Jocelyn Kaufman 06/13/2024, 11:22 AM

## 2024-06-13 NOTE — Discharge Instructions (Addendum)
 It was a pleasure taking care of you today.  Based on your history, physical exam, and imaging I feel you are safe for discharge.  Today on your imaging it looks like you have 3 different fractures in your right foot.  2 are involving your big toe of your right foot and the third is one of your midfoot bones.  Because of this you have been placed in an orthopedic boot, please wear this boot for comfort and continue to walk with your cane/walker.  You have also been prescribed a narcotic pain medication called Norco, please take as prescribed.  Please keep in mind that Norco contains Tylenol , with each tablet containing 325 mg.  Please be sure to not exceed the daily limit of 4000 mg/day of Tylenol  with any other Tylenol  that you are taking at home.  You have been given information for an orthopedic follow-up, please call soon as possible and follow-up with orthopedics for ongoing diagnosis and treatment.  If you experience any of the following symptoms including but not limited to severe pain, inability to walk, falls, severe swelling, calf pain, swelling or redness, please return to the emergency department or seek further medical care. Please continue to use rest, ice, compression, and elevation to help with pain and swelling. You may continue to take over the counter tylenol , but please watch daily limits.

## 2024-06-15 NOTE — Progress Notes (Signed)
 RE: cpap supplies Received: Today New, Adine Neysa Nena GORMAN, RN; Joylene Carlean Sheree Leveda Jackson Easter Delsa Eleanor; 1 other Received, thank you!     Previous Messages    ----- Message ----- From: Neysa Nena GORMAN, RN Sent: 06/15/2024   8:41 AM EDT To: Adine Joylene; Avelina Jackson; Ephraim Dollar* Subject: cpap supplies                                  Good morning,  Pt has new order in [facility],  for supplies.  She will have new sleep study 07-05-2024 for new machine pending results.    Darice CLEMENTEEN Berger Female, 68 y.o., Jan 10, 1956 Pronouns: she/her/hers MRN: [id number]  Thanks ,  [place]

## 2024-06-16 DIAGNOSIS — S93401A Sprain of unspecified ligament of right ankle, initial encounter: Secondary | ICD-10-CM | POA: Diagnosis not present

## 2024-06-16 DIAGNOSIS — S92411A Displaced fracture of proximal phalanx of right great toe, initial encounter for closed fracture: Secondary | ICD-10-CM | POA: Diagnosis not present

## 2024-06-16 DIAGNOSIS — S92321A Displaced fracture of second metatarsal bone, right foot, initial encounter for closed fracture: Secondary | ICD-10-CM | POA: Diagnosis not present

## 2024-06-17 NOTE — Telephone Encounter (Signed)
 New, Adine Boring, Heather CROME, RN; Joylene Carlean Jackson Avelina; Tucker, Dolanda; Ziegler, Melissa; 1 other Order was recieved yesterday and snap team is working it already. I messaged them and ask for stat processing.  Thanks,  [employer]

## 2024-06-17 NOTE — Telephone Encounter (Signed)
 Order sent to [employer]. Requested expedited processing.

## 2024-06-19 DIAGNOSIS — G4733 Obstructive sleep apnea (adult) (pediatric): Secondary | ICD-10-CM | POA: Diagnosis not present

## 2024-06-22 ENCOUNTER — Telehealth: Payer: Self-pay | Admitting: Neurology

## 2024-06-22 DIAGNOSIS — E113392 Type 2 diabetes mellitus with moderate nonproliferative diabetic retinopathy without macular edema, left eye: Secondary | ICD-10-CM | POA: Diagnosis not present

## 2024-06-22 NOTE — Telephone Encounter (Signed)
 [employer] shara: [id number] (exp. 06/19/24 to 09/17/24)  Patient is scheduled at [facility] for 07/05/2024 at 8 pm.  Mailed packet and sent mychart.  Patient has had two fail [name] attempts.

## 2024-06-23 ENCOUNTER — Ambulatory Visit: Admitting: Podiatry

## 2024-06-24 ENCOUNTER — Other Ambulatory Visit: Payer: Self-pay | Admitting: *Deleted

## 2024-06-24 ENCOUNTER — Other Ambulatory Visit (HOSPITAL_COMMUNITY): Payer: Self-pay

## 2024-06-24 DIAGNOSIS — D649 Anemia, unspecified: Secondary | ICD-10-CM

## 2024-06-24 MED ORDER — MIRABEGRON ER 50 MG PO TB24
50.0000 mg | ORAL_TABLET | Freq: Every day | ORAL | 11 refills | Status: DC
  Filled 2024-06-24: qty 30, 30d supply, fill #0

## 2024-06-25 ENCOUNTER — Inpatient Hospital Stay: Attending: Hematology and Oncology

## 2024-06-25 ENCOUNTER — Inpatient Hospital Stay

## 2024-06-25 ENCOUNTER — Other Ambulatory Visit (HOSPITAL_COMMUNITY): Payer: Self-pay

## 2024-06-25 VITALS — BP 153/66 | HR 69 | Temp 98.5°F

## 2024-06-25 DIAGNOSIS — D638 Anemia in other chronic diseases classified elsewhere: Secondary | ICD-10-CM

## 2024-06-25 DIAGNOSIS — D649 Anemia, unspecified: Secondary | ICD-10-CM

## 2024-06-25 DIAGNOSIS — N1831 Chronic kidney disease, stage 3a: Secondary | ICD-10-CM | POA: Insufficient documentation

## 2024-06-25 DIAGNOSIS — E1122 Type 2 diabetes mellitus with diabetic chronic kidney disease: Secondary | ICD-10-CM | POA: Insufficient documentation

## 2024-06-25 DIAGNOSIS — D631 Anemia in chronic kidney disease: Secondary | ICD-10-CM | POA: Diagnosis not present

## 2024-06-25 LAB — CMP (CANCER CENTER ONLY)
ALT: 14 U/L (ref 0–44)
AST: 17 U/L (ref 15–41)
Albumin: 3.8 g/dL (ref 3.5–5.0)
Alkaline Phosphatase: 83 U/L (ref 38–126)
Anion gap: 5 (ref 5–15)
BUN: 23 mg/dL (ref 8–23)
CO2: 26 mmol/L (ref 22–32)
Calcium: 9.3 mg/dL (ref 8.9–10.3)
Chloride: 110 mmol/L (ref 98–111)
Creatinine: 1.16 mg/dL — ABNORMAL HIGH (ref 0.44–1.00)
GFR, Estimated: 51 mL/min — ABNORMAL LOW (ref >60)
Glucose, Bld: 105 mg/dL — ABNORMAL HIGH (ref 70–99)
Potassium: 4.6 mmol/L (ref 3.5–5.1)
Sodium: 141 mmol/L (ref 135–145)
Total Bilirubin: 0.4 mg/dL (ref 0.0–1.2)
Total Protein: 6.5 g/dL (ref 6.5–8.1)

## 2024-06-25 LAB — CBC WITH DIFFERENTIAL (CANCER CENTER ONLY)
Abs Immature Granulocytes: 0.01 K/uL (ref 0.00–0.07)
Basophils Absolute: 0.1 K/uL (ref 0.0–0.1)
Basophils Relative: 1 %
Eosinophils Absolute: 0.2 K/uL (ref 0.0–0.5)
Eosinophils Relative: 3 %
HCT: 33.3 % — ABNORMAL LOW (ref 36.0–46.0)
Hemoglobin: 10.1 g/dL — ABNORMAL LOW (ref 12.0–15.0)
Immature Granulocytes: 0 %
Lymphocytes Relative: 24 %
Lymphs Abs: 1.3 K/uL (ref 0.7–4.0)
MCH: 27.2 pg (ref 26.0–34.0)
MCHC: 30.3 g/dL (ref 30.0–36.0)
MCV: 89.5 fL (ref 80.0–100.0)
Monocytes Absolute: 0.5 K/uL (ref 0.1–1.0)
Monocytes Relative: 9 %
Neutro Abs: 3.3 K/uL (ref 1.7–7.7)
Neutrophils Relative %: 63 %
Platelet Count: 213 K/uL (ref 150–400)
RBC: 3.72 MIL/uL — ABNORMAL LOW (ref 3.87–5.11)
RDW: 15.3 % (ref 11.5–15.5)
WBC Count: 5.2 K/uL (ref 4.0–10.5)
nRBC: 0 % (ref 0.0–0.2)

## 2024-06-25 LAB — IRON AND IRON BINDING CAPACITY (CC-WL,HP ONLY)
Iron: 82 ug/dL (ref 28–170)
Saturation Ratios: 32 % — ABNORMAL HIGH (ref 10.4–31.8)
TIBC: 260 ug/dL (ref 250–450)
UIBC: 178 ug/dL (ref 148–442)

## 2024-06-25 LAB — FERRITIN: Ferritin: 384 ng/mL — ABNORMAL HIGH (ref 11–307)

## 2024-06-25 MED ORDER — EPOETIN ALFA-EPBX 40000 UNIT/ML IJ SOLN
40000.0000 [IU] | Freq: Once | INTRAMUSCULAR | Status: AC
  Administered 2024-06-25: 40000 [IU] via SUBCUTANEOUS
  Filled 2024-06-25: qty 1

## 2024-06-25 MED ORDER — OXYBUTYNIN CHLORIDE ER 10 MG PO TB24
10.0000 mg | ORAL_TABLET | Freq: Every day | ORAL | 11 refills | Status: DC
  Filled 2024-06-25: qty 30, 30d supply, fill #0
  Filled 2024-07-19: qty 30, 30d supply, fill #1

## 2024-06-30 DIAGNOSIS — M0579 Rheumatoid arthritis with rheumatoid factor of multiple sites without organ or systems involvement: Secondary | ICD-10-CM | POA: Diagnosis not present

## 2024-07-05 ENCOUNTER — Ambulatory Visit (INDEPENDENT_AMBULATORY_CARE_PROVIDER_SITE_OTHER): Admitting: Neurology

## 2024-07-05 DIAGNOSIS — W19XXXS Unspecified fall, sequela: Secondary | ICD-10-CM

## 2024-07-05 DIAGNOSIS — E669 Obesity, unspecified: Secondary | ICD-10-CM

## 2024-07-05 DIAGNOSIS — G4720 Circadian rhythm sleep disorder, unspecified type: Secondary | ICD-10-CM

## 2024-07-05 DIAGNOSIS — G4733 Obstructive sleep apnea (adult) (pediatric): Secondary | ICD-10-CM | POA: Diagnosis not present

## 2024-07-05 DIAGNOSIS — R634 Abnormal weight loss: Secondary | ICD-10-CM

## 2024-07-05 DIAGNOSIS — R2689 Other abnormalities of gait and mobility: Secondary | ICD-10-CM

## 2024-07-07 DIAGNOSIS — S92321D Displaced fracture of second metatarsal bone, right foot, subsequent encounter for fracture with routine healing: Secondary | ICD-10-CM | POA: Diagnosis not present

## 2024-07-07 DIAGNOSIS — S93401D Sprain of unspecified ligament of right ankle, subsequent encounter: Secondary | ICD-10-CM | POA: Diagnosis not present

## 2024-07-07 DIAGNOSIS — S92411D Displaced fracture of proximal phalanx of right great toe, subsequent encounter for fracture with routine healing: Secondary | ICD-10-CM | POA: Diagnosis not present

## 2024-07-13 DIAGNOSIS — L299 Pruritus, unspecified: Secondary | ICD-10-CM | POA: Diagnosis not present

## 2024-07-13 DIAGNOSIS — L6681 Central centrifugal cicatricial alopecia: Secondary | ICD-10-CM | POA: Diagnosis not present

## 2024-07-13 DIAGNOSIS — L249 Irritant contact dermatitis, unspecified cause: Secondary | ICD-10-CM | POA: Diagnosis not present

## 2024-07-14 NOTE — Procedures (Unsigned)
 Physician Interpretation: Please see link under Procedure Tab or under Encounters tab for physician report, technical report, as well as O2 titration and/or PAP titration tables (if applicable).   Referred by: Dr. Modena Callander   History and Indication for Testing (obtained from visit note dated 04/30/2024): 68 year old female with an underlying complex medical history of stroke in May 2025, hypertension, hyperlipidemia, coronary artery disease, prior smoking, COPD, history of kidney stone, carotid artery disease with status post bilateral carotid endarterectomies, hypothyroidism, and overweight state, who reports snoring and nocturia. His Epworth sleepiness score is 3 out of 24, fatigue severity score is 15 out of 63.     Review of the EEG showed no abnormal electrical discharges and symmetrical bihemispheric findings.     EKG: The EKG revealed normal sinus rhythm (NSR). ***   AUDIO/VIDEO REVIEW: The audio and video review did not show any abnormal or unusual behaviors, movements, phonations or vocalizations. The patient took *** restroom breaks. Snoring was noted, ***   POST-STUDY QUESTIONNAIRE: Post study, the patient indicated, that sleep was *** the same as usual.    IMPRESSION:    Obstructive Sleep Apnea (OSA), *** ***Central Sleep Apnea (CSA) ***Primary Snoring ***Primary Central Sleep Apnea ***Complex Sleep Apnea ***PLMD (periodic limb movement disorder [of sleep]) ***Dysfunctions associated with sleep stages or arousal from sleep ***Non-specific abnormal electrocardiogram (EKG) ***Poor sleep pattern ***Inconclusive Test   RECOMMENDATIONS:         I certify that I have reviewed the entire raw data recording prior to the issuance of this report in accordance with the Standards of Accreditation of the American Academy of Sleep Medicine (AASM).   True Mar, MD, PhD Medical Director, [facility]) Diplomat, ABPN (Neurology and  Sleep)

## 2024-07-16 ENCOUNTER — Ambulatory Visit: Payer: Self-pay | Admitting: Neurology

## 2024-07-16 DIAGNOSIS — G4733 Obstructive sleep apnea (adult) (pediatric): Secondary | ICD-10-CM

## 2024-07-19 ENCOUNTER — Other Ambulatory Visit (HOSPITAL_COMMUNITY): Payer: Self-pay

## 2024-07-20 ENCOUNTER — Other Ambulatory Visit (HOSPITAL_COMMUNITY): Payer: Self-pay

## 2024-07-20 ENCOUNTER — Other Ambulatory Visit: Payer: Self-pay

## 2024-07-20 DIAGNOSIS — E113392 Type 2 diabetes mellitus with moderate nonproliferative diabetic retinopathy without macular edema, left eye: Secondary | ICD-10-CM | POA: Diagnosis not present

## 2024-07-20 DIAGNOSIS — H59813 Chorioretinal scars after surgery for detachment, bilateral: Secondary | ICD-10-CM | POA: Diagnosis not present

## 2024-07-20 DIAGNOSIS — H401131 Primary open-angle glaucoma, bilateral, mild stage: Secondary | ICD-10-CM | POA: Diagnosis not present

## 2024-07-20 DIAGNOSIS — H3582 Retinal ischemia: Secondary | ICD-10-CM | POA: Diagnosis not present

## 2024-07-20 DIAGNOSIS — E113311 Type 2 diabetes mellitus with moderate nonproliferative diabetic retinopathy with macular edema, right eye: Secondary | ICD-10-CM | POA: Diagnosis not present

## 2024-07-20 NOTE — Telephone Encounter (Signed)
-----   Message from Jocelyn Kaufman sent at 07/16/2024  5:37 PM EDT ----- Pt had a split night study which showed severe OSA. She is currently on CPAP of 11 cm and should be eligible for a new machine. I recommend a higher pressure setting on the new machine with autoPAP  of 11-15 cm with EPR of 3. She will need a FU within 90d. Pls reinforce ongoing full compliance on the new machine.  ----- Message ----- From: Interface, Scanned Link In One Three One Sent: 07/16/2024   5:34 PM EDT To: Jocelyn Mar, MD

## 2024-07-20 NOTE — Telephone Encounter (Signed)
 Spoke with patient and discussed sleep study results as noted below. The patient verbalized understanding. She states she currently has a resmed 10 that seems to work fine. She will discuss new machine options vs keeping current machine with D[facility]. Patient was scheduled for a 31-90 day follow-up on 12/18 @ 1130 arrive 1115. Her questions were answered. She was reminded to use machine at least 4 hours at night. She appreciated the call.  Order sent to [facility].

## 2024-07-21 ENCOUNTER — Other Ambulatory Visit (HOSPITAL_COMMUNITY): Payer: Self-pay

## 2024-07-21 ENCOUNTER — Other Ambulatory Visit: Payer: Self-pay | Admitting: *Deleted

## 2024-07-21 DIAGNOSIS — D649 Anemia, unspecified: Secondary | ICD-10-CM

## 2024-07-21 MED ORDER — SULFASALAZINE 500 MG PO TABS
500.0000 mg | ORAL_TABLET | Freq: Two times a day (BID) | ORAL | 0 refills | Status: DC
  Filled 2024-07-21: qty 180, 90d supply, fill #0

## 2024-07-21 MED ORDER — LEFLUNOMIDE 20 MG PO TABS
20.0000 mg | ORAL_TABLET | Freq: Every day | ORAL | 0 refills | Status: DC
  Filled 2024-07-21: qty 90, 90d supply, fill #0

## 2024-07-21 NOTE — Telephone Encounter (Signed)
 New, Maryella Shivers, Otilio Jefferson, RN; Alain Honey; Jeris Penta, [place]; 1 other Received, thank you!

## 2024-07-22 ENCOUNTER — Inpatient Hospital Stay

## 2024-07-22 ENCOUNTER — Inpatient Hospital Stay: Attending: Hematology and Oncology | Admitting: Hematology and Oncology

## 2024-07-22 VITALS — BP 138/55 | HR 76 | Temp 97.5°F | Resp 18 | Ht 60.0 in | Wt 235.5 lb

## 2024-07-22 DIAGNOSIS — D631 Anemia in chronic kidney disease: Secondary | ICD-10-CM | POA: Insufficient documentation

## 2024-07-22 DIAGNOSIS — N1831 Chronic kidney disease, stage 3a: Secondary | ICD-10-CM | POA: Diagnosis not present

## 2024-07-22 DIAGNOSIS — E1122 Type 2 diabetes mellitus with diabetic chronic kidney disease: Secondary | ICD-10-CM | POA: Diagnosis not present

## 2024-07-22 DIAGNOSIS — D638 Anemia in other chronic diseases classified elsewhere: Secondary | ICD-10-CM

## 2024-07-22 DIAGNOSIS — N183 Chronic kidney disease, stage 3 unspecified: Secondary | ICD-10-CM

## 2024-07-22 DIAGNOSIS — E113311 Type 2 diabetes mellitus with moderate nonproliferative diabetic retinopathy with macular edema, right eye: Secondary | ICD-10-CM | POA: Diagnosis not present

## 2024-07-22 DIAGNOSIS — D649 Anemia, unspecified: Secondary | ICD-10-CM

## 2024-07-22 LAB — FERRITIN: Ferritin: 264 ng/mL (ref 11–307)

## 2024-07-22 LAB — CBC WITH DIFFERENTIAL (CANCER CENTER ONLY)
Abs Immature Granulocytes: 0.01 K/uL (ref 0.00–0.07)
Basophils Absolute: 0 K/uL (ref 0.0–0.1)
Basophils Relative: 1 %
Eosinophils Absolute: 0.1 K/uL (ref 0.0–0.5)
Eosinophils Relative: 3 %
HCT: 33.6 % — ABNORMAL LOW (ref 36.0–46.0)
Hemoglobin: 10.2 g/dL — ABNORMAL LOW (ref 12.0–15.0)
Immature Granulocytes: 0 %
Lymphocytes Relative: 27 %
Lymphs Abs: 1.3 K/uL (ref 0.7–4.0)
MCH: 26.8 pg (ref 26.0–34.0)
MCHC: 30.4 g/dL (ref 30.0–36.0)
MCV: 88.4 fL (ref 80.0–100.0)
Monocytes Absolute: 0.5 K/uL (ref 0.1–1.0)
Monocytes Relative: 10 %
Neutro Abs: 2.8 K/uL (ref 1.7–7.7)
Neutrophils Relative %: 59 %
Platelet Count: 197 K/uL (ref 150–400)
RBC: 3.8 MIL/uL — ABNORMAL LOW (ref 3.87–5.11)
RDW: 15.8 % — ABNORMAL HIGH (ref 11.5–15.5)
WBC Count: 4.7 K/uL (ref 4.0–10.5)
nRBC: 0 % (ref 0.0–0.2)

## 2024-07-22 LAB — CMP (CANCER CENTER ONLY)
ALT: 16 U/L (ref 0–44)
AST: 20 U/L (ref 15–41)
Albumin: 3.8 g/dL (ref 3.5–5.0)
Alkaline Phosphatase: 76 U/L (ref 38–126)
Anion gap: 4 — ABNORMAL LOW (ref 5–15)
BUN: 21 mg/dL (ref 8–23)
CO2: 25 mmol/L (ref 22–32)
Calcium: 9 mg/dL (ref 8.9–10.3)
Chloride: 110 mmol/L (ref 98–111)
Creatinine: 1.16 mg/dL — ABNORMAL HIGH (ref 0.44–1.00)
GFR, Estimated: 51 mL/min — ABNORMAL LOW (ref >60)
Glucose, Bld: 129 mg/dL — ABNORMAL HIGH (ref 70–99)
Potassium: 5 mmol/L (ref 3.5–5.1)
Sodium: 139 mmol/L (ref 135–145)
Total Bilirubin: 0.4 mg/dL (ref 0.0–1.2)
Total Protein: 6.7 g/dL (ref 6.5–8.1)

## 2024-07-22 LAB — IRON AND IRON BINDING CAPACITY (CC-WL,HP ONLY)
Iron: 81 ug/dL (ref 28–170)
Saturation Ratios: 29 % (ref 10.4–31.8)
TIBC: 280 ug/dL (ref 250–450)
UIBC: 199 ug/dL (ref 148–442)

## 2024-07-22 MED ORDER — EPOETIN ALFA-EPBX 40000 UNIT/ML IJ SOLN
40000.0000 [IU] | Freq: Once | INTRAMUSCULAR | Status: AC
  Administered 2024-07-22: 40000 [IU] via SUBCUTANEOUS
  Filled 2024-07-22: qty 1

## 2024-07-22 NOTE — Progress Notes (Signed)
 Patient Care Team: Clarice Nottingham, MD as PCP - General (Internal Medicine) Mona Vinie BROCKS, MD as PCP - Cardiology (Cardiology) Odean Potts, MD as Consulting Physician (Hematology and Oncology) Curt Lighter, MD as Consulting Physician (Rheumatology) Josephina Aures, MD as Consulting Physician (Endocrinology) Patrcia Sharper, MD as Consulting Physician (Ophthalmology) Jerrye Katheryn BROCKS, MD as Consulting Physician (Nephrology)  DIAGNOSIS:  Encounter Diagnosis  Name Primary?   Anemia in stage 3 chronic kidney disease, unspecified whether stage 3a or 3b CKD (HCC) Yes      CHIEF COMPLIANT: Follow-up of anemia of chronic disease  HISTORY OF PRESENT ILLNESS: Jocelyn Kaufman is a 68 year old with above-mentioned 20 chronic disease who is currently on Retacrit  injections.  She continues to experience fatigue but overall no major difference in how she feels.  Injections are going quite well.  Denies any new problems or complaints.      ALLERGIES:  is allergic to semaglutide (0.25 or 0.5mg -dos), shellfish-derived products, empagliflozin, semaglutide , tramadol hcl, canagliflozin, gemtesa  [vibegron ], latex, liraglutide, minoxidil, omeprazole, and percocet [oxycodone -acetaminophen ].  MEDICATIONS:  Current Outpatient Medications  Medication Sig Dispense Refill   ACCU-CHEK GUIDE test strip      Accu-Chek Softclix Lancets lancets use to check blood sugar     Aflibercept  (EYLEA ) 2 MG/0.05ML SOLN 2 mg by Intravitreal route every 3 (three) months.     albuterol  (VENTOLIN  HFA) 108 (90 Base) MCG/ACT inhaler Inhale two puffs every four to six hours as needed for cough or wheeze. 18 g 2   amLODipine  (NORVASC ) 10 MG tablet Take 1 tablet (10 mg total) by mouth daily.     aspirin  EC 81 MG tablet Take 81 mg by mouth daily. Swallow whole.     atorvastatin  (LIPITOR) 40 MG tablet Take 1 tablet (40 mg total) by mouth daily at 6 PM. 30 tablet 0   Azelaic Acid (FINACEA) 15 % FOAM Apply topically. Apply to scalp twice  daily     B-D ULTRAFINE III SHORT PEN 31G X 8 MM MISC SMARTSIG:1 Each SUB-Q Daily     CHELATED IRON PO Take 18 mg by mouth daily.     Cholecalciferol  (VITAMIN D3) 125 MCG (5000 UT) CAPS Take 5,000 Units by mouth daily.     clindamycin (CLEOCIN T) 1 % external solution Apply to underarm daily to twice as needed.     CLOBETASOL PROPIONATE E 0.05 % emollient cream Apply topically 3 (three) times daily.     dapagliflozin  propanediol (FARXIGA ) 5 MG TABS tablet Take 1 tablet (5 mg total) by mouth daily before breakfast.     Dulaglutide  (TRULICITY ) 1.5 MG/0.5ML SOAJ Inject 1.5 mg into the skin once a week. 6 mL 1   EPINEPHrine  (EPIPEN  2-PAK) 0.3 mg/0.3 mL IJ SOAJ injection Use as directed for life-threatening allergic reaction. 2 each 1   epoetin  alfa-epbx (RETACRIT ) 2000 UNIT/ML injection See admin instructions.     escitalopram  (LEXAPRO ) 20 MG tablet Take 20 mg by mouth daily.     ezetimibe  (ZETIA ) 10 MG tablet Take 1 tablet (10 mg total) by mouth daily.     fexofenadine  (ALLEGRA  ALLERGY) 180 MG tablet Take 1 tablet (180 mg total) by mouth daily.     folic acid  (FOLVITE ) 1 MG tablet Take 1 mg by mouth 2 (two) times daily.     furosemide  (LASIX ) 20 MG tablet Take one tablet daily for 2 days then as needed for fluid retention. If taking more than 3 times per week, please let cardiology team know. 90 tablet 3   hydrALAZINE  (APRESOLINE )  50 MG tablet Take 50 mg by mouth 3 (three) times daily.     HYDROcodone -acetaminophen  (NORCO/VICODIN) 5-325 MG tablet Take 1 tablet by mouth every 4 (four) hours as needed for moderate pain (pain score 4-6) or severe pain (pain score 7-10). 8 tablet 0   hydrocortisone cream 1 % Apply 1 Application topically 2 (two) times daily as needed for itching.     inFLIXimab  (REMICADE ) 100 MG injection Inject into the vein.     insulin  aspart (NOVOLOG ) 100 UNIT/ML FlexPen Inject into the skin See admin instructions. Sliding Scale     insulin  glargine, 2 Unit Dial, (TOUJEO  MAX  SOLOSTAR) 300 UNIT/ML Solostar Pen Inject 42 Units into the skin at bedtime.     irbesartan  (AVAPRO ) 300 MG tablet Take 1 tablet (300 mg total) by mouth daily.     KERENDIA 10 MG TABS 10 mg daily.     Lancets Misc. (ACCU-CHEK SOFTCLIX LANCET DEV) KIT See admin instructions.     latanoprost  (XALATAN ) 0.005 % ophthalmic solution Place 1 drop into both eyes at bedtime.     leflunomide  (ARAVA ) 20 MG tablet Take 1 tablet (20 mg total) by mouth daily. 90 tablet 0   lidocaine  (LMX) 4 % cream Apply 1 Application topically as needed (pain).     metoprolol  succinate (TOPROL -XL) 25 MG 24 hr tablet Take 1.5 tablets (37.5 mg total) by mouth daily. May take additional half tablet as needed once daily for palpitations. 135 tablet 3   Multiple Vitamins-Minerals (CENTRUM SILVER  PO) Take 1 tablet by mouth daily with breakfast.     oxybutynin  (DITROPAN -XL) 10 MG 24 hr tablet Take 1 tablet (10 mg total) by mouth daily. 30 tablet 11   sulfaSALAzine  (AZULFIDINE ) 500 MG tablet Take 3 tablets (1,500 mg total) by mouth 2 (two) times daily.     sulfaSALAzine  (AZULFIDINE ) 500 MG tablet Take 1 tablet (500 mg total) by mouth 2 (two) times daily. 180 tablet 0   SYNTHROID  125 MCG tablet Take 125 mcg by mouth every Monday, Tuesday, Wednesday, Thursday, and Friday.     triamcinolone  cream (KENALOG ) 0.1 % Apply 1 Application topically 2 (two) times daily as needed (for itching- affected areas).     vitamin B-12 (CYANOCOBALAMIN ) 1000 MCG tablet Take 1,000 mcg by mouth daily.     No current facility-administered medications for this visit.    PHYSICAL EXAMINATION: ECOG PERFORMANCE STATUS: 1 - Symptomatic but completely ambulatory  Vitals:   07/22/24 1059  BP: (!) 138/55  Pulse: 76  Resp: 18  Temp: (!) 97.5 F (36.4 C)  SpO2: 97%   Filed Weights   07/22/24 1059  Weight: 235 lb 8 oz (106.8 kg)      LABORATORY DATA:  I have reviewed the data as listed    Latest Ref Rng & Units 07/22/2024   10:44 AM 06/25/2024     9:00 AM 04/30/2024    8:55 AM  CMP  Glucose 70 - 99 mg/dL 870  894  853   BUN 8 - 23 mg/dL 21  23  34   Creatinine 0.44 - 1.00 mg/dL 8.83  8.83  8.13   Sodium 135 - 145 mmol/L 139  141  136   Potassium 3.5 - 5.1 mmol/L 5.0  4.6  5.7   Chloride 98 - 111 mmol/L 110  110  104   CO2 22 - 32 mmol/L 25  26  26    Calcium  8.9 - 10.3 mg/dL 9.0  9.3  9.5   Total Protein  6.5 - 8.1 g/dL 6.7  6.5  6.7   Total Bilirubin 0.0 - 1.2 mg/dL 0.4  0.4  0.4   Alkaline Phos 38 - 126 U/L 76  83  65   AST 15 - 41 U/L 20  17  21    ALT 0 - 44 U/L 16  14  20      Lab Results  Component Value Date   WBC 4.7 07/22/2024   HGB 10.2 (L) 07/22/2024   HCT 33.6 (L) 07/22/2024   MCV 88.4 07/22/2024   PLT 197 07/22/2024   NEUTROABS 2.8 07/22/2024    ASSESSMENT & PLAN:  Anemia in chronic kidney disease Patient has history of hypothyroidism as well as possible adrenal dysfunction issues for which she goes to [facility]    Etiology: Anemia of chronic disease secondary to chronic kidney disease, hypothyroidism and adrenal issues 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  (Retacrit  injections for hemoglobin 11 or less) Lab review 03/23/2020: Erythropoietin  27.9, hemoglobin 9.6 02/02/2021: Hemoglobin 10.6 (given her severe symptoms continue with Aranesp ) 01/02/2022: Hemoglobin 10.7 (no Retacrit  today): Last Retacrit  was on 12/05/2021  09/19/2022: Hemoglobin 11.2 (Retacrit  given 08/31/2022) 12/18/2022: Hemoglobin 10.6 (recommended that she receive Retacrit  today) 06/25/2023: Hemoglobin 11.2 (held Retacrit ) 07/17/2023: Hemoglobin 10.2 10/08/2023: Hemoglobin 9.9 12/10/2023: Hemoglobin 12.1 (methotrexate  was discontinued in December 2024) 02/04/2024: Hemoglobin 10.3 04/30/2024: Hemoglobin 11.9 (injection Retacrit  not being given today) 07/22/2024: Hemoglobin 10.2     Recheck her labs every 4 weeks and injection appointments if hemoglobin is less than  10.6. She plans to take iron and folic acid  supplement. I will see her back in 3 months with labs and follow-up.      No orders of the defined types were placed in this encounter.  The patient has a good understanding of the overall plan. she agrees with it. she will call with any problems that may develop before the next visit here. Total time spent: 30 mins including face to face time and time spent for planning, charting and co-ordination of care   Jocelyn MARLA Chad, MD 07/22/24

## 2024-07-22 NOTE — Assessment & Plan Note (Signed)
 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  (Retacrit  injections for hemoglobin 11 or less) Lab review 03/23/2020: Erythropoietin  27.9, hemoglobin 9.6 02/02/2021: Hemoglobin 10.6 (given her severe symptoms continue with Aranesp ) 01/02/2022: Hemoglobin 10.7 (no Retacrit  today): Last Retacrit  was on 12/05/2021  09/19/2022: Hemoglobin 11.2 (Retacrit  given 08/31/2022) 12/18/2022: Hemoglobin 10.6 (recommended that she receive Retacrit  today) 06/25/2023: Hemoglobin 11.2 (held Retacrit ) 07/17/2023: Hemoglobin 10.2 10/08/2023: Hemoglobin 9.9 12/10/2023: Hemoglobin 12.1 (methotrexate  was discontinued in December 2024) 02/04/2024: Hemoglobin 10.3 04/30/2024: Hemoglobin 11.9 (injection Retacrit  not being given today)     Recheck her labs every 4 weeks and injection appointments if hemoglobin is less than 10.6. She plans to take iron and folic acid  supplement. I will see her back in 3 months with labs and follow-up.

## 2024-07-24 DIAGNOSIS — M654 Radial styloid tenosynovitis [de Quervain]: Secondary | ICD-10-CM | POA: Diagnosis not present

## 2024-07-28 ENCOUNTER — Telehealth (HOSPITAL_COMMUNITY): Payer: Self-pay

## 2024-07-28 ENCOUNTER — Other Ambulatory Visit (HOSPITAL_COMMUNITY): Payer: Self-pay

## 2024-07-28 ENCOUNTER — Encounter (HOSPITAL_BASED_OUTPATIENT_CLINIC_OR_DEPARTMENT_OTHER): Payer: Self-pay | Admitting: *Deleted

## 2024-07-28 ENCOUNTER — Ambulatory Visit (HOSPITAL_BASED_OUTPATIENT_CLINIC_OR_DEPARTMENT_OTHER): Admitting: Family

## 2024-07-28 ENCOUNTER — Encounter (HOSPITAL_BASED_OUTPATIENT_CLINIC_OR_DEPARTMENT_OTHER): Payer: Self-pay | Admitting: Family

## 2024-07-28 ENCOUNTER — Telehealth: Payer: Self-pay | Admitting: Pharmacy Technician

## 2024-07-28 ENCOUNTER — Telehealth: Payer: Self-pay

## 2024-07-28 ENCOUNTER — Encounter: Payer: Self-pay | Admitting: Hematology and Oncology

## 2024-07-28 VITALS — BP 120/60 | HR 76 | Ht 60.0 in | Wt 231.0 lb

## 2024-07-28 DIAGNOSIS — S93401D Sprain of unspecified ligament of right ankle, subsequent encounter: Secondary | ICD-10-CM | POA: Diagnosis not present

## 2024-07-28 DIAGNOSIS — I5032 Chronic diastolic (congestive) heart failure: Secondary | ICD-10-CM | POA: Diagnosis not present

## 2024-07-28 DIAGNOSIS — I10 Essential (primary) hypertension: Secondary | ICD-10-CM

## 2024-07-28 DIAGNOSIS — G4733 Obstructive sleep apnea (adult) (pediatric): Secondary | ICD-10-CM

## 2024-07-28 DIAGNOSIS — E785 Hyperlipidemia, unspecified: Secondary | ICD-10-CM

## 2024-07-28 DIAGNOSIS — I2510 Atherosclerotic heart disease of native coronary artery without angina pectoris: Secondary | ICD-10-CM

## 2024-07-28 DIAGNOSIS — S92411D Displaced fracture of proximal phalanx of right great toe, subsequent encounter for fracture with routine healing: Secondary | ICD-10-CM | POA: Diagnosis not present

## 2024-07-28 MED ORDER — REPATHA SURECLICK 140 MG/ML ~~LOC~~ SOAJ
140.0000 mg | SUBCUTANEOUS | 3 refills | Status: AC
  Filled 2024-07-28: qty 6, 84d supply, fill #0
  Filled 2024-10-05: qty 6, 84d supply, fill #1

## 2024-07-28 NOTE — Telephone Encounter (Signed)
-----   Message from [facility] Edsel MATSU sent at 07/28/2024 10:25 AM EDT ----- Repatha please PA  TY Edsel

## 2024-07-28 NOTE — Telephone Encounter (Signed)
   Pharmacy Patient Advocate Encounter   Received notification from Pt Calls Messages that prior authorization for repatha is required/requested.   Insurance verification completed.   The patient is insured through [employer] .   Per test claim: PA required; PA submitted to above mentioned insurance via Latent Key/confirmation #/EOC [id number] Status is pending

## 2024-07-28 NOTE — Telephone Encounter (Signed)
 Pharmacy Patient Advocate Encounter  Received notification from [employer] that Prior Authorization for repatha has been APPROVED from 10/30/23 to 10/28/24   PA #/Case ID/Reference #: [id number]

## 2024-07-28 NOTE — Patient Instructions (Signed)
 Medication Instructions:   START Inject 140 mg into the skin every 14 (fourteen) days. - Subcutaneous   *If you need a refill on your cardiac medications before your next appointment, please call your pharmacy*  Lab Work:  Your physician recommends that you return for a FASTING lipid profile, fasting after midnight in 3 months. Paperwork given to patient today.    If you have labs (blood work) drawn today and your tests are completely normal, you will receive your results only by: MyChart Message (if you have MyChart) OR A paper copy in the mail If you have any lab test that is abnormal or we need to change your treatment, we will call you to review the results.  Testing/Procedures:  None ordered.  Follow-Up: At [facility], you and your health needs are our priority.  As part of our continuing mission to provide you with exceptional heart care, our providers are all part of one team.  This team includes your primary Cardiologist (physician) and Advanced Practice Providers or APPs (Physician Assistants and Nurse Practitioners) who all work together to provide you with the care you need, when you need it.  Your next appointment:   6 month(s)  Provider:   Reche Finder, NP    We recommend signing up for the patient portal called MyChart.  Sign up information is provided on this After Visit Summary.  MyChart is used to connect with patients for Virtual Visits (Telemedicine).  Patients are able to view lab/test results, encounter notes, upcoming appointments, etc.  Non-urgent messages can be sent to your provider as well.   To learn more about what you can do with MyChart, go to [email or web address].   Other Instructions  Your physician wants you to follow-up in: 6 months.  You will receive a reminder letter in the mail two months in advance. If you don't receive a letter, please call our office to schedule the follow-up appointment.

## 2024-07-28 NOTE — Progress Notes (Signed)
 Cardiology Office Note:  .   Date:  07/28/2024  ID:  Jocelyn Kaufman, DOB [birth year], MRN [id number] PCP: Clarice Nottingham, MD  Nephrology: Dr. Katheryn Saba [facility] Providers Cardiologist:  Vinie JAYSON Maxcy, MD    History of Present Illness: .   Jocelyn Kaufman is a 68 y.o. 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   Seen 04/2021 doing overall well with excpetion of one episode of chest discomfort that was atypical for angina as occurred at rest and self resolved. It occurred after a fall in her yard and was thought to be musculoskeletal in natrua. No ischemic evaluation recommended.    At last visit 05/07/22 noted exertional dyspnea with subsequent echocardiogram 04/2022 normal LVEF 55-60%, moderate to severe mitral calcification with mild MR. Myoview  07/31/22 low risk study with no ischemia.    Admitted 08/20/22 with PE after quadriceps tendon repair 2 weeks prior. Treated with OAC for 3 months. Echo during admission 07/2022 LVEF 45-50%, LV global hypokinesis, mild MR, aortic sclerosis without stenosis.   Updated echo 02/2023 LVEF 50-55%, moderate LVH, aortic valve sclerosis without stenosis. At follow up 06/22/23 Chlorthalidone  was changed from PRN to 12.5mg  daily.   Seen 12/03/2023.  She had atypical chest pain the week prior for 3 days, no ischemic evaluation recommended.  She was given clearance to use minoxidil per dermatology for hair loss.  Via subsequent MyChart message she noted increased palpitations and metoprolol  was increased to 37.5 mg daily.  At visit 04/03/24 due to inconsistent utilization of Chlorthalidone  her volume status was increased. Chlorthalidone  stopped and she was prescribed Lasix  20mg  daily x 2 days then PRN.   Presents today for follow up.  She has been doing well from a cardiac standpoint. Notes episodic tachycardia, felt on left side of chest, lasting seconds with no identifiable triggers. There is intermittent shortness of breath, which she attributes to weather and chronic pain from arthritis. This is overall unchanged from previous. She is using lasix  20mg  on average once weekly for edema, mostly of left ankle. She had a mechanical fall 05/2024 with right foot fracture and has been out of the orthotic foot brace for 5 days. This caused her gait imbalance and subsequent back pain.. She denies chest pain, heaviness, tightness. Notes occasional edema of left ankle. She recently completed sleep study that indicated severe OSA and hopes to get a new CPAP machine, ordered by neurology.   She is compliant with her current medications, without reports of side effects. Labs 12/2023: LDL 9. She notes her activity has been limited since a fall in 09/2023.  She recently celebrated her 50th high school class reunion which she enjoyed.   ROS: Please see the history of present illness.    All other systems reviewed and are negative.   Studies Reviewed: .     Cardiac Studies & Procedures   ______________________________________________________________________________________________   STRESS TESTS  MYOCARDIAL PERFUSION IMAGING 07/31/2022  Interpretation Summary   The study is normal. The study is low risk.   No ST deviation was noted.   LV perfusion is normal. There is no evidence of ischemia. There is no evidence of infarction.   Left ventricular function is normal. Nuclear stress EF: 55 %. The left ventricular ejection fraction is normal (55-65%). End diastolic cavity size is normal. End systolic  cavity size is normal.   Prior study available for comparison from 10/16/2018.  Breast attenuation no ischemia normal EF 55%   ECHOCARDIOGRAM  ECHOCARDIOGRAM COMPLETE 03/21/2023  Narrative ECHOCARDIOGRAM REPORT    Patient Name:   Jocelyn Kaufman Date of Exam: 03/21/2023 Medical Rec #:  [id number]    Height:       64.5 in Accession #:    [id number]   Weight:       230.0 lb Date of Birth:  [birth year]    BSA:          2.087 m Patient Age:    78 years     BP:           125/60 mmHg Patient Gender: F            HR:           66 bpm. Exam Location:  Outpatient  Procedure: 2D Echo, 3D Echo, Cardiac Doppler, Color Doppler and Strain Analysis  Indications:    Dyspnea  History:        Patient has prior history of Echocardiogram examinations, most recent 05/15/2022. TIA; Risk Factors:Hypertension and Non-Smoker. Pericardial Effusion, Paroxysmal Ventricular Tachycardia, Pulmonary Embolism.  Sonographer:    Orvil Holmes RDCS Referring Phys: [id number] Cire Deyarmin S Janifer Gieselman  IMPRESSIONS   1. Left ventricular ejection fraction, by estimation, is 50 to 55%. The left ventricle has low normal function. The left ventricle demonstrates global hypokinesis. The left ventricular internal cavity size was mildly dilated. Left ventricular diastolic parameters are consistent with Grade III diastolic dysfunction (restrictive). 2. Right ventricular systolic function is normal. The right ventricular size is normal. There is normal pulmonary artery systolic pressure. 3. Left atrial size was mild to moderately dilated. 4. Right atrial size was mildly dilated. 5. The mitral valve is grossly normal. Trivial mitral valve regurgitation. No evidence of mitral stenosis. Moderate to severe mitral annular calcification. 6. The aortic valve was not well visualized. There is mild calcification of the aortic valve. There is mild thickening of the aortic valve. Aortic valve regurgitation is not visualized. Aortic valve sclerosis is present, with no evidence of aortic valve stenosis. 7. The inferior vena cava is normal in size with greater than 50% respiratory variability, suggesting right atrial pressure of 3 mmHg.  Comparison(s): Prior images reviewed side by side.  Changes from prior study are noted. EF slightly improved compared to prior study.  FINDINGS Left Ventricle: Left ventricular ejection fraction, by estimation, is 50 to 55%. The left ventricle has low normal function. The left ventricle demonstrates global hypokinesis. The left ventricular internal cavity size was mildly dilated. There is no left ventricular hypertrophy. Left ventricular diastolic parameters are consistent with Grade III diastolic dysfunction (restrictive).  Right Ventricle: The right ventricular size is normal. Right vetricular wall thickness was not well visualized. Right ventricular systolic function is normal. There is normal pulmonary artery systolic pressure. The tricuspid regurgitant velocity is 2.13 m/s, and with an assumed right atrial pressure of 3 mmHg, the estimated right ventricular systolic pressure is 21.1 mmHg.  Left Atrium: Left atrial size was mild to moderately dilated.  Right Atrium: Right atrial size was mildly dilated.  Pericardium: There is no evidence of pericardial effusion.  Mitral Valve: The mitral valve is grossly normal. Moderate to severe mitral annular calcification. Trivial mitral valve regurgitation. No evidence of mitral valve stenosis. MV peak gradient, 8.0 mmHg. The mean mitral valve gradient is 2.0 mmHg.  Tricuspid Valve: The tricuspid valve is grossly normal. Tricuspid  valve regurgitation is trivial. No evidence of tricuspid stenosis.  Aortic Valve: The aortic valve was not well visualized. There is mild calcification of the aortic valve. There is mild thickening of the aortic valve. Aortic valve regurgitation is not visualized. Aortic valve sclerosis is present, with no evidence of aortic valve stenosis.  Pulmonic Valve: The pulmonic valve was not well visualized. Pulmonic valve regurgitation is trivial.  Aorta: The aortic root, ascending aorta, aortic arch and descending aorta are all structurally normal, with no evidence of dilitation  or obstruction.  Venous: The inferior vena cava is normal in size with greater than 50% respiratory variability, suggesting right atrial pressure of 3 mmHg.  IAS/Shunts: The interatrial septum was not well visualized.   LEFT VENTRICLE PLAX 2D LVIDd:         5.09 cm   Diastology LVIDs:         3.76 cm   LV e' medial:    3.71 cm/s LV PW:         1.24 cm   LV E/e' medial:  38.0 LV IVS:        0.77 cm   LV e' lateral:   3.30 cm/s LVOT diam:     2.10 cm   LV E/e' lateral: 42.7 LV SV:         66 LV SV Index:   32 LVOT Area:     3.46 cm  3D Volume EF: 3D EF:        55 % LV EDV:       125 ml LV ESV:       57 ml LV SV:        69 ml  RIGHT VENTRICLE RV Basal diam:  4.38 cm RV Mid diam:    3.21 cm RV S prime:     8.86 cm/s TAPSE (M-mode): 2.7 cm  LEFT ATRIUM             Index        RIGHT ATRIUM           Index LA diam:        4.70 cm 2.25 cm/m   RA Area:     19.10 cm LA Vol (A2C):   75.2 ml 36.03 ml/m  RA Volume:   54.80 ml  26.26 ml/m LA Vol (A4C):   65.8 ml 31.53 ml/m LA Biplane Vol: 71.7 ml 34.35 ml/m AORTIC VALVE LVOT Vmax:   90.60 cm/s LVOT Vmean:  59.400 cm/s LVOT VTI:    0.191 m  AORTA Ao Root diam: 2.90 cm Ao Asc diam:  3.50 cm  MITRAL VALVE                TRICUSPID VALVE MV Area (PHT): 4.60 cm     TR Peak grad:   18.1 mmHg MV Area VTI:   1.86 cm     TR Vmax:        213.00 cm/s MV Peak grad:  8.0 mmHg MV Mean grad:  2.0 mmHg     SHUNTS MV Vmax:       1.41 m/s     Systemic VTI:  0.19 m MV Vmean:      51.7 cm/s    Systemic Diam: 2.10 cm MV Decel Time: 165 msec MR Peak grad: 43.8 mmHg MR Vmax:      331.00 cm/s MV E velocity: 141.00 cm/s MV A velocity: 42.00 cm/s MV E/A ratio:  3.36  Shelda Bruckner MD Electronically signed by Shelda Bruckner MD  Signature Date/Time: 03/25/2023/10:13:56 PM    Final    MONITORS  CARDIAC EVENT MONITOR 08/06/2018        ______________________________________________________________________________________________           Risk Assessment/Calculations:            Physical Exam:   VS:  BP 120/60 (BP Location: Left Arm, Patient Position: Sitting, Cuff Size: Normal)   Pulse 76   Ht 5' (1.524 m)   Wt 231 lb (104.8 kg)   SpO2 97%   BMI 45.11 kg/m    Wt Readings from Last 3 Encounters:  07/28/24 231 lb (104.8 kg)  07/22/24 235 lb 8 oz (106.8 kg)  05/28/24 231 lb 12.8 oz (105.1 kg)   BP Readings from Last 3 Encounters:  07/28/24 120/60  07/22/24 (!) 138/55  06/25/24 (!) 153/66     GEN: Well nourished, well developed in no acute distress NECK: No JVD; No carotid bruits CARDIAC: RRR, no murmurs, rubs, gallops; radial and PT pulses 2+ bilaterally  RESPIRATORY:  Clear to auscultation without rales, wheezing or rhonchi  ABDOMEN: Soft, non-tender, non-distended EXTREMITIES:  No edema; No deformity   ASSESSMENT AND PLAN: .    Diastolic heart failure - Echo 89/7976 LVEF 45-50% in setting of PE. Echo 02/2023 LVEF 50-55%, GIII diastolic dysfunction. Furosemide  20mg  daily PRN added at last visit, with intermittent utilization of once weekly. No reported weight gain of 3lbs in 24 hrs or 5lbs in 1 week Previously did not tolerate Jardiance due to yeast infection. Able to tolerate Farxiga  5mg  daily.  Continue GDMT: metoprolol  succinate 37.5mg  daily, irbesartan  300mg  daily, farxiga  5mg  daily Continue furosemide  20mg  daily PRN. Encouraged to modify time of medication to fit within her daily schedule. Advised to take lasix  for 2 consecutive days as needed for shortness of breath.  NSVT / PVC - Controlled on metoprolol  succinate 37.5mg  every day. She may taken additional half-tablet daily as needed for breakthrough, sustained palpitations.    DM2 / Obesity  - Continue to follow with PCP. Current therapy of SGLT2i and GLP1 with cardioprotective benefit.    Coronary artery calcification on CT / HLD, LDL goal  <70 - no indication for ischemia evaluation per HPI and PE. Low risk Myoview  07/2022; 12/2023 LDL 94 on atorvastatin  40mg  and zetia  10mg  daily.  Continue GMDT: aspirin  81mg , daily, metoprolol  succinate 37.5mg  daily, atorvastatin  40mg  daily Will add Repatha 140mg /ml q2weeks for LDL goal <70.  Repeat lipid panel in 3 months Recommend aiming for 150 minutes of moderate intensity physical activity per week and following a heart-healthy diet. This could include water aerobics, chair exercises such as Fabulous 50s on [employer].   HTN - BP well controlled, at goal of <130/80; 07/22/24: BUN 21, creat 1.16, Na 139, K 5.0 Continue: amlodipine  5mg  daily, hydralazine  50mg  TID, irbesartan  300mg  daily, furosemide  20mg  daily PRN Appreciate nephrology input in hypertension management    OSA - recent sleep study with severe OSA; plans for new CPAP machine, arranged with neurology   CKD - On Kerendia per nephrology team Dr. Jerrye.   Anemia of chronic disease - follows with Dr. Gudena; 07/22/24: Hgb 10.2; Retacrit  injections for Hgb <10.6       Dispo: follow up 6 months.   Signed, Reche GORMAN Finder, NP

## 2024-07-28 NOTE — Telephone Encounter (Signed)
 Pharmacy Patient Advocate Encounter   Received notification from Physician's Office that prior authorization for REPATHA is required/requested.   Insurance verification completed.   The patient is insured through [place] .   Per test claim: The current 28 day co-pay is, $0.  No PA needed at this time. This test claim was processed through [facility]- copay amounts may vary at other pharmacies due to pharmacy/plan contracts, or as the patient moves through the different stages of their insurance plan.

## 2024-08-03 ENCOUNTER — Other Ambulatory Visit (HOSPITAL_COMMUNITY): Payer: Self-pay

## 2024-08-03 DIAGNOSIS — G4733 Obstructive sleep apnea (adult) (pediatric): Secondary | ICD-10-CM | POA: Diagnosis not present

## 2024-08-03 MED ORDER — ATORVASTATIN CALCIUM 40 MG PO TABS
40.0000 mg | ORAL_TABLET | Freq: Every day | ORAL | 1 refills | Status: AC
  Filled 2024-08-03: qty 90, 90d supply, fill #0

## 2024-08-04 ENCOUNTER — Other Ambulatory Visit: Payer: Self-pay

## 2024-08-04 ENCOUNTER — Other Ambulatory Visit (HOSPITAL_COMMUNITY): Payer: Self-pay

## 2024-08-06 DIAGNOSIS — R351 Nocturia: Secondary | ICD-10-CM | POA: Diagnosis not present

## 2024-08-06 DIAGNOSIS — R350 Frequency of micturition: Secondary | ICD-10-CM | POA: Diagnosis not present

## 2024-08-06 DIAGNOSIS — R399 Unspecified symptoms and signs involving the genitourinary system: Secondary | ICD-10-CM | POA: Diagnosis not present

## 2024-08-06 DIAGNOSIS — N3281 Overactive bladder: Secondary | ICD-10-CM | POA: Diagnosis not present

## 2024-08-11 DIAGNOSIS — M0579 Rheumatoid arthritis with rheumatoid factor of multiple sites without organ or systems involvement: Secondary | ICD-10-CM | POA: Diagnosis not present

## 2024-08-14 ENCOUNTER — Emergency Department (HOSPITAL_COMMUNITY)

## 2024-08-14 ENCOUNTER — Emergency Department (HOSPITAL_COMMUNITY)
Admission: EM | Admit: 2024-08-14 | Discharge: 2024-08-14 | Disposition: A | Discharge status: Home / Self Care | Attending: Emergency Medicine | Admitting: Emergency Medicine

## 2024-08-14 ENCOUNTER — Other Ambulatory Visit: Payer: Self-pay

## 2024-08-14 DIAGNOSIS — E1122 Type 2 diabetes mellitus with diabetic chronic kidney disease: Secondary | ICD-10-CM | POA: Insufficient documentation

## 2024-08-14 DIAGNOSIS — Y9301 Activity, walking, marching and hiking: Secondary | ICD-10-CM | POA: Diagnosis not present

## 2024-08-14 DIAGNOSIS — W1830XA Fall on same level, unspecified, initial encounter: Secondary | ICD-10-CM | POA: Insufficient documentation

## 2024-08-14 DIAGNOSIS — S40812A Abrasion of left upper arm, initial encounter: Secondary | ICD-10-CM | POA: Diagnosis not present

## 2024-08-14 DIAGNOSIS — R9089 Other abnormal findings on diagnostic imaging of central nervous system: Secondary | ICD-10-CM | POA: Diagnosis not present

## 2024-08-14 DIAGNOSIS — M069 Rheumatoid arthritis, unspecified: Secondary | ICD-10-CM | POA: Diagnosis not present

## 2024-08-14 DIAGNOSIS — Z794 Long term (current) use of insulin: Secondary | ICD-10-CM | POA: Insufficient documentation

## 2024-08-14 DIAGNOSIS — S0990XA Unspecified injury of head, initial encounter: Secondary | ICD-10-CM | POA: Diagnosis not present

## 2024-08-14 DIAGNOSIS — Z7982 Long term (current) use of aspirin: Secondary | ICD-10-CM | POA: Insufficient documentation

## 2024-08-14 DIAGNOSIS — M85872 Other specified disorders of bone density and structure, left ankle and foot: Secondary | ICD-10-CM | POA: Diagnosis not present

## 2024-08-14 DIAGNOSIS — N189 Chronic kidney disease, unspecified: Secondary | ICD-10-CM | POA: Diagnosis not present

## 2024-08-14 DIAGNOSIS — E041 Nontoxic single thyroid nodule: Secondary | ICD-10-CM | POA: Insufficient documentation

## 2024-08-14 DIAGNOSIS — Z7984 Long term (current) use of oral hypoglycemic drugs: Secondary | ICD-10-CM | POA: Diagnosis not present

## 2024-08-14 DIAGNOSIS — Z7985 Long-term (current) use of injectable non-insulin antidiabetic drugs: Secondary | ICD-10-CM | POA: Insufficient documentation

## 2024-08-14 DIAGNOSIS — S99912A Unspecified injury of left ankle, initial encounter: Secondary | ICD-10-CM | POA: Diagnosis present

## 2024-08-14 DIAGNOSIS — M25572 Pain in left ankle and joints of left foot: Secondary | ICD-10-CM | POA: Diagnosis present

## 2024-08-14 DIAGNOSIS — I129 Hypertensive chronic kidney disease with stage 1 through stage 4 chronic kidney disease, or unspecified chronic kidney disease: Secondary | ICD-10-CM | POA: Diagnosis not present

## 2024-08-14 DIAGNOSIS — S82832A Other fracture of upper and lower end of left fibula, initial encounter for closed fracture: Secondary | ICD-10-CM | POA: Diagnosis not present

## 2024-08-14 DIAGNOSIS — S0003XA Contusion of scalp, initial encounter: Secondary | ICD-10-CM | POA: Diagnosis not present

## 2024-08-14 DIAGNOSIS — R519 Headache, unspecified: Secondary | ICD-10-CM | POA: Diagnosis present

## 2024-08-14 DIAGNOSIS — W19XXXA Unspecified fall, initial encounter: Secondary | ICD-10-CM

## 2024-08-14 DIAGNOSIS — M19072 Primary osteoarthritis, left ankle and foot: Secondary | ICD-10-CM | POA: Diagnosis not present

## 2024-08-14 DIAGNOSIS — Z79620 Long term (current) use of immunosuppressive biologic: Secondary | ICD-10-CM | POA: Insufficient documentation

## 2024-08-14 DIAGNOSIS — M1712 Unilateral primary osteoarthritis, left knee: Secondary | ICD-10-CM | POA: Diagnosis not present

## 2024-08-14 DIAGNOSIS — S82402A Unspecified fracture of shaft of left fibula, initial encounter for closed fracture: Secondary | ICD-10-CM | POA: Diagnosis not present

## 2024-08-14 DIAGNOSIS — G319 Degenerative disease of nervous system, unspecified: Secondary | ICD-10-CM | POA: Diagnosis not present

## 2024-08-14 DIAGNOSIS — M85862 Other specified disorders of bone density and structure, left lower leg: Secondary | ICD-10-CM | POA: Diagnosis not present

## 2024-08-14 LAB — CBC WITH DIFFERENTIAL/PLATELET
Abs Immature Granulocytes: 0.02 K/uL (ref 0.00–0.07)
Basophils Absolute: 0 K/uL (ref 0.0–0.1)
Basophils Relative: 1 %
Eosinophils Absolute: 0.2 K/uL (ref 0.0–0.5)
Eosinophils Relative: 2 %
HCT: 34.5 % — ABNORMAL LOW (ref 36.0–46.0)
Hemoglobin: 9.8 g/dL — ABNORMAL LOW (ref 12.0–15.0)
Immature Granulocytes: 0 %
Lymphocytes Relative: 18 %
Lymphs Abs: 1.3 K/uL (ref 0.7–4.0)
MCH: 25.9 pg — ABNORMAL LOW (ref 26.0–34.0)
MCHC: 28.4 g/dL — ABNORMAL LOW (ref 30.0–36.0)
MCV: 91.3 fL (ref 80.0–100.0)
Monocytes Absolute: 0.6 K/uL (ref 0.1–1.0)
Monocytes Relative: 9 %
Neutro Abs: 5.2 K/uL (ref 1.7–7.7)
Neutrophils Relative %: 70 %
Platelets: 161 K/uL (ref 150–400)
RBC: 3.78 MIL/uL — ABNORMAL LOW (ref 3.87–5.11)
RDW: 15.9 % — ABNORMAL HIGH (ref 11.5–15.5)
WBC: 7.3 K/uL (ref 4.0–10.5)
nRBC: 0 % (ref 0.0–0.2)

## 2024-08-14 LAB — COMPREHENSIVE METABOLIC PANEL WITH GFR
ALT: 19 U/L (ref 0–44)
AST: 26 U/L (ref 15–41)
Albumin: 3.7 g/dL (ref 3.5–5.0)
Alkaline Phosphatase: 80 U/L (ref 38–126)
Anion gap: 9 (ref 5–15)
BUN: 21 mg/dL (ref 8–23)
CO2: 23 mmol/L (ref 22–32)
Calcium: 9.4 mg/dL (ref 8.9–10.3)
Chloride: 107 mmol/L (ref 98–111)
Creatinine, Ser: 1.09 mg/dL — ABNORMAL HIGH (ref 0.44–1.00)
GFR, Estimated: 55 mL/min — ABNORMAL LOW (ref >60)
Glucose, Bld: 119 mg/dL — ABNORMAL HIGH (ref 70–99)
Potassium: 4 mmol/L (ref 3.5–5.1)
Sodium: 139 mmol/L (ref 135–145)
Total Bilirubin: 0.6 mg/dL (ref 0.0–1.2)
Total Protein: 6.2 g/dL — ABNORMAL LOW (ref 6.5–8.1)

## 2024-08-14 LAB — URINALYSIS, ROUTINE W REFLEX MICROSCOPIC
Bacteria, UA: NONE SEEN
Bilirubin Urine: NEGATIVE
Glucose, UA: >=500 mg/dL — AB
Ketones, ur: NEGATIVE mg/dL
Leukocytes,Ua: NEGATIVE
Nitrite: NEGATIVE
Protein, ur: 100 mg/dL — AB
Specific Gravity, Urine: 1.009 (ref 1.005–1.030)
pH: 6 (ref 5.0–8.0)

## 2024-08-14 MED ORDER — TETANUS-DIPHTH-ACELL PERTUSSIS 5-2-15.5 LF-MCG/0.5 IM SUSP: 0.5000 mL | Freq: Once | INTRAMUSCULAR | Status: DC

## 2024-08-14 NOTE — Discharge Instructions (Addendum)
 You were seen in the ER today for evaluation after your fall. You have broken one of the bones in your leg. You will need to wear your CAM boot and follow up with orthopedics. I have included the information for Dr. Kendal that you saw previously. Please make sure you call to schedule an appointment. You can take your previously prescribed narcotic medication for breakthrough pain, otherwise you can take Tylenol . Make sure that you are elevating your leg and wearing your CAM boot. You can use your walker as needed to ambulate. I have included additional information for you to review. If you have any concerns, new or worsening symptoms, please return to the nearest ER for re-evaluation.   Please let your PCP know that we updated your tetanus shot!   Contact a health care provider if: The boot is cracked or damaged. The boot doesn't fit right. Your foot or leg hurts. You have a rash, sore, or open sore on your foot or leg. The skin on your foot or leg is pale. You have a wound or cut on your foot that's getting worse. Your skin is painful, red, or irritated. Your swelling doesn't get better, or it gets worse. Get help right away if: Your foot or leg turns numb. The skin on your foot or leg is: Cold. Blue. Jocelyn Kaufman.

## 2024-08-14 NOTE — ED Provider Notes (Signed)
 [place] EMERGENCY DEPARTMENT AT [facility] Provider Note   CSN: [id number] Arrival date & time: 08/14/24  1000     Patient presents with: Fall and Leg Pain   Jocelyn Kaufman is a 68 y.o. female with history of hypertension, CKD, diabetes, rheumatoid arthritis presents emerged from today for evaluation after a fall.  Patient reports that she was carrying her groceries walking across grass when she felt unsteady given the change under her feet and fell hitting her head on the bottom step.  She denies any change in vision or any LOC.  She reports some pain in her left ankle and some headache initially however that is subsided.  She denies any other injury.  She has been able to weight-bear however with is using a walker.  She was concerned given the persistent pain in her ankle and wanted to be evaluated in the ER.  Denies any blood thinner use.   Fall Associated symptoms include headaches. Pertinent negatives include no chest pain, no abdominal pain and no shortness of breath.  Leg Pain Associated symptoms: no fever        Prior to Admission medications   Medication Sig Start Date End Date Taking? Authorizing Provider  ACCU-CHEK GUIDE test strip  11/16/22   [provider]  Accu-Chek Softclix Lancets lancets use to check blood sugar 04/26/21   [provider]  Aflibercept  (EYLEA ) 2 MG/0.05ML SOLN 2 mg by Intravitreal route every 3 (three) months. 04/25/20   Odean Potts, MD  albuterol  (VENTOLIN  HFA) 108 (90 Base) MCG/ACT inhaler Inhale two puffs every four to six hours as needed for cough or wheeze. 10/05/21   Jeneal Danita Macintosh, MD  amLODipine  (NORVASC ) 5 MG tablet Take 1 tablet (5 mg total) by mouth daily. 04/30/24     amLODipine  (NORVASC ) 5 MG tablet Take 5 mg by mouth daily.    [provider]  aspirin  EC 81 MG tablet Take 81 mg by mouth daily. Swallow whole.    [provider]  atorvastatin  (LIPITOR) 40 MG tablet Take 1 tablet (40  mg total) by mouth daily at 6 PM. 07/12/18   Rosario Leatrice FERNS, MD  atorvastatin  (LIPITOR) 40 MG tablet Take 1 tablet (40 mg total) by mouth daily. 08/03/24     Azelaic Acid (FINACEA) 15 % FOAM Apply topically. Apply to scalp twice daily    [provider]  B-D ULTRAFINE III SHORT PEN 31G X 8 MM MISC SMARTSIG:1 Each SUB-Q Daily 02/12/20   [provider]  CHELATED IRON PO Take 18 mg by mouth daily.    [provider]  Cholecalciferol  (VITAMIN D3) 125 MCG (5000 UT) CAPS Take 5,000 Units by mouth daily.    [provider]  clindamycin (CLEOCIN T) 1 % external solution Apply to underarm daily to twice as needed. 05/27/23   [provider]  CLOBETASOL PROPIONATE E 0.05 % emollient cream Apply topically 3 (three) times daily. 06/05/23   [provider]  dapagliflozin  propanediol (FARXIGA ) 5 MG TABS tablet Take 1 tablet (5 mg total) by mouth daily before breakfast. 10/08/23   Odean Potts, MD  Dulaglutide  (TRULICITY ) 1.5 MG/0.5ML SOAJ Inject 1.5 mg into the skin once a week. 04/03/24     EPINEPHrine  (EPIPEN  2-PAK) 0.3 mg/0.3 mL IJ SOAJ injection Use as directed for life-threatening allergic reaction. 04/05/22   Jeneal Danita Macintosh, MD  epoetin  alfa-epbx (RETACRIT ) 2000 UNIT/ML injection See admin instructions.    [provider]  escitalopram  (LEXAPRO ) 20  MG tablet Take 20 mg by mouth daily.    [provider]  Evolocumab (REPATHA SURECLICK) 140 MG/ML SOAJ Inject 140 mg into the skin every 14 (fourteen) days. 07/28/24   Vannie Reche RAMAN, NP  ezetimibe  (ZETIA ) 10 MG tablet Take 1 tablet (10 mg total) by mouth daily. 04/25/20   Gudena, Vinay, MD  fexofenadine  (ALLEGRA  ALLERGY) 180 MG tablet Take 1 tablet (180 mg total) by mouth daily. 11/21/21   Gudena, Vinay, MD  folic acid  (FOLVITE ) 1 MG tablet Take 1 mg by mouth 2 (two) times daily. 05/28/24   [provider]  furosemide  (LASIX ) 20 MG tablet Take one tablet daily for 2 days then  as needed for fluid retention. If taking more than 3 times per week, please let cardiology team know. 04/03/24   Vannie Reche RAMAN, NP  hydrALAZINE  (APRESOLINE ) 50 MG tablet Take 50 mg by mouth 3 (three) times daily.    [provider]  HYDROcodone -acetaminophen  (NORCO/VICODIN) 5-325 MG tablet Take 1 tablet by mouth every 4 (four) hours as needed for moderate pain (pain score 4-6) or severe pain (pain score 7-10). 06/13/24   Hinnant, Collin F, PA-C  hydrocortisone cream 1 % Apply 1 Application topically 2 (two) times daily as needed for itching.    [provider]  inFLIXimab  (REMICADE ) 100 MG injection Inject into the vein.    [provider]  insulin  aspart (NOVOLOG ) 100 UNIT/ML FlexPen Inject into the skin See admin instructions. Sliding Scale    [provider]  insulin  glargine, 2 Unit Dial, (TOUJEO  MAX SOLOSTAR) 300 UNIT/ML Solostar Pen Inject 42 Units into the skin at bedtime.    [provider]  irbesartan  (AVAPRO ) 300 MG tablet Take 1 tablet (300 mg total) by mouth daily. 02/01/22   Gudena, Vinay, MD  KERENDIA 10 MG TABS 10 mg daily. 05/14/23   [provider]  Lancets Misc. (ACCU-CHEK SOFTCLIX LANCET DEV) KIT See admin instructions. 04/25/21   [provider]  latanoprost  (XALATAN ) 0.005 % ophthalmic solution Place 1 drop into both eyes at bedtime.    [provider]  leflunomide  (ARAVA ) 20 MG tablet Take 1 tablet (20 mg total) by mouth daily. 07/21/24     lidocaine  (LMX) 4 % cream Apply 1 Application topically as needed (pain).    [provider]  metoprolol  succinate (TOPROL -XL) 25 MG 24 hr tablet Take 1.5 tablets (37.5 mg total) by mouth daily. May take additional half tablet as needed once daily for palpitations. 04/03/24   Vannie Reche RAMAN, NP  Multiple Vitamins-Minerals (CENTRUM SILVER  PO) Take 1 tablet by mouth daily with breakfast.    [provider]  oxybutynin  (DITROPAN -XL) 10 MG 24 hr tablet Take 1  tablet (10 mg total) by mouth daily. 06/25/24     sulfaSALAzine  (AZULFIDINE ) 500 MG tablet Take 3 tablets (1,500 mg total) by mouth 2 (two) times daily. 11/21/21   Gudena, Vinay, MD  sulfaSALAzine  (AZULFIDINE ) 500 MG tablet Take 1 tablet (500 mg total) by mouth 2 (two) times daily. 07/21/24     SYNTHROID  125 MCG tablet Take 125 mcg by mouth every Monday, Tuesday, Wednesday, Thursday, and Friday.    [provider]  triamcinolone  cream (KENALOG ) 0.1 % Apply 1 Application topically 2 (two) times daily as needed (for itching- affected areas). 08/03/22   [provider]  vitamin B-12 (CYANOCOBALAMIN ) 1000 MCG tablet Take 1,000 mcg by mouth daily.    [provider]    Allergies: Semaglutide (0.25 or 0.5mg -dos), Shellfish protein-containing  drug products, Empagliflozin, Semaglutide , Tramadol hcl, Canagliflozin, Gemtesa  [vibegron ], Latex, Liraglutide, Minoxidil, Omeprazole, and Percocet [oxycodone -acetaminophen ]    Review of Systems  Constitutional:  Negative for chills and fever.  Eyes:  Negative for photophobia and visual disturbance.  Respiratory:  Negative for shortness of breath.   Cardiovascular:  Negative for chest pain.  Gastrointestinal:  Negative for abdominal pain, nausea and vomiting.  Musculoskeletal:  Positive for arthralgias and joint swelling.  Neurological:  Positive for headaches. Negative for dizziness, syncope, speech difficulty and light-headedness.  See HPI  Updated Vital Signs BP (!) 178/89 (BP Location: Left Arm)   Pulse 73   Temp 98.5 F (36.9 C) (Oral)   Resp 18   Ht 5' (1.524 m)   Wt 104.3 kg   SpO2 99%   BMI 44.92 kg/m   Physical Exam Vitals and nursing note reviewed.  Constitutional:      General: She is not in acute distress.    Appearance: She is not ill-appearing or toxic-appearing.  HENT:     Head:      Comments: Small bruising to the marked area above.  No step-off or deformity.  No swelling.  No battle signs or raccoon eyes.     Mouth/Throat:     Mouth: Mucous membranes are moist.  Eyes:     General: No scleral icterus.    Extraocular Movements: Extraocular movements intact.     Pupils: Pupils are equal, round, and reactive to light.  Cardiovascular:     Rate and Rhythm: Normal rate.     Pulses: Normal pulses.          Radial pulses are 2+ on the right side and 2+ on the left side.       Dorsalis pedis pulses are 2+ on the right side and 2+ on the left side.       Posterior tibial pulses are 2+ on the right side and 2+ on the left side.  Pulmonary:     Effort: Pulmonary effort is normal. No respiratory distress.  Chest:     Chest wall: No tenderness.  Abdominal:     Palpations: Abdomen is soft.     Tenderness: There is no abdominal tenderness.  Musculoskeletal:        General: Swelling, tenderness and signs of injury present.     Comments: Patient does have some tenderness and swelling mainly to the lateral malleoli on the left.  No tenderness into the foot or throughout the rest of the lower extremities other than listed above.  Palpable pulses.  Compartments are soft throughout.  Sensation reportedly intact and symmetric as well.  Skin is intact in the lower extremity.  No other tenderness into the legs.  Pelvis is stable.  Nontender. Strength intact.   Skin:    General: Skin is warm and dry.     Comments: Superficial scratches abrasions present to the left upper extremity but nontender throughout the arm.  Neurological:     General: No focal deficit present.     Mental Status: She is alert.     GCS: GCS eye subscore is 4. GCS verbal subscore is 5. GCS motor subscore is 6.     Cranial Nerves: No cranial nerve deficit, dysarthria or facial asymmetry.     Sensory: No sensory deficit.     Motor: No weakness.     (all labs ordered are listed, but only abnormal results are displayed) Labs Reviewed  CBC WITH DIFFERENTIAL/PLATELET - Abnormal; Notable for the following components:  Result Value   RBC  3.78 (*)    Hemoglobin 9.8 (*)    HCT 34.5 (*)    MCH 25.9 (*)    MCHC 28.4 (*)    RDW 15.9 (*)    All other components within normal limits  COMPREHENSIVE METABOLIC PANEL WITH GFR - Abnormal; Notable for the following components:   Glucose, Bld 119 (*)    Creatinine, Ser 1.09 (*)    Total Protein 6.2 (*)    GFR, Estimated 55 (*)    All other components within normal limits  URINALYSIS, ROUTINE W REFLEX MICROSCOPIC - Abnormal; Notable for the following components:   Glucose, UA >=500 (*)    Hgb urine dipstick SMALL (*)    Protein, ur 100 (*)    All other components within normal limits    EKG: None  Radiology: CT Head Wo Contrast Result Date: 08/14/2024 CLINICAL DATA:  Patient stumbled and fell striking left side of head. EXAM: CT HEAD WITHOUT CONTRAST CT CERVICAL SPINE WITHOUT CONTRAST TECHNIQUE: Multidetector CT imaging of the head and cervical spine was performed following the standard protocol without intravenous contrast. Multiplanar CT image reconstructions of the cervical spine were also generated. RADIATION DOSE REDUCTION: This exam was performed according to the departmental dose-optimization program which includes automated exposure control, adjustment of the mA and/or kV according to patient size and/or use of iterative reconstruction technique. COMPARISON:  None Available. FINDINGS: CT HEAD FINDINGS Brain: There is no evidence for acute hemorrhage, hydrocephalus, mass lesion, or abnormal extra-axial fluid collection. No definite CT evidence for acute infarction. Diffuse loss of parenchymal volume is consistent with atrophy. Vascular: No hyperdense vessel or unexpected calcification. Skull: No evidence for fracture. No worrisome lytic or sclerotic lesion. Sinuses/Orbits: The visualized paranasal sinuses and mastoid air cells are clear. Visualized portions of the globes and intraorbital fat are unremarkable. Other: None. CT CERVICAL SPINE FINDINGS Alignment: Reversal of normal  cervical lordosis without evidence for traumatic subluxation. Skull base and vertebrae: No acute fracture. No primary bone lesion or focal pathologic process. Soft tissues and spinal canal: No prevertebral fluid or swelling. No visible canal hematoma.1.5 cm left thyroid  nodule evident. Disc levels: Mild loss of disc height noted C4-5, C6-7 and C7-T1. Facet degeneration is most advanced on the left at C7-T1. Broad-based posterior disc osteophyte complex identified at C5-6 with posterior broad-based bulging disc noted at C6-7. Upper chest: No acute findings. Other: None. IMPRESSION: 1. No acute intracranial abnormality.  Atrophy. 2. Degenerative changes in the cervical spine without fracture or traumatic subluxation. 3. 1.5 cm left thyroid  nodule. Recommend thyroid  US  (ref: J Am Coll Radiol. 2015 Feb;12(2): 143-50). Electronically Signed   By: Camellia Candle M.D.   On: 08/14/2024 15:01   CT Cervical Spine Wo Contrast Result Date: 08/14/2024 CLINICAL DATA:  Patient stumbled and fell striking left side of head. EXAM: CT HEAD WITHOUT CONTRAST CT CERVICAL SPINE WITHOUT CONTRAST TECHNIQUE: Multidetector CT imaging of the head and cervical spine was performed following the standard protocol without intravenous contrast. Multiplanar CT image reconstructions of the cervical spine were also generated. RADIATION DOSE REDUCTION: This exam was performed according to the departmental dose-optimization program which includes automated exposure control, adjustment of the mA and/or kV according to patient size and/or use of iterative reconstruction technique. COMPARISON:  None Available. FINDINGS: CT HEAD FINDINGS Brain: There is no evidence for acute hemorrhage, hydrocephalus, mass lesion, or abnormal extra-axial fluid collection. No definite CT evidence for acute infarction. Diffuse loss of parenchymal volume is consistent  with atrophy. Vascular: No hyperdense vessel or unexpected calcification. Skull: No evidence for fracture.  No worrisome lytic or sclerotic lesion. Sinuses/Orbits: The visualized paranasal sinuses and mastoid air cells are clear. Visualized portions of the globes and intraorbital fat are unremarkable. Other: None. CT CERVICAL SPINE FINDINGS Alignment: Reversal of normal cervical lordosis without evidence for traumatic subluxation. Skull base and vertebrae: No acute fracture. No primary bone lesion or focal pathologic process. Soft tissues and spinal canal: No prevertebral fluid or swelling. No visible canal hematoma.1.5 cm left thyroid  nodule evident. Disc levels: Mild loss of disc height noted C4-5, C6-7 and C7-T1. Facet degeneration is most advanced on the left at C7-T1. Broad-based posterior disc osteophyte complex identified at C5-6 with posterior broad-based bulging disc noted at C6-7. Upper chest: No acute findings. Other: None. IMPRESSION: 1. No acute intracranial abnormality.  Atrophy. 2. Degenerative changes in the cervical spine without fracture or traumatic subluxation. 3. 1.5 cm left thyroid  nodule. Recommend thyroid  US  (ref: J Am Coll Radiol. 2015 Feb;12(2): 143-50). Electronically Signed   By: Camellia Candle M.D.   On: 08/14/2024 15:01   DG Tibia/Fibula Left Result Date: 08/14/2024 CLINICAL DATA:  fall EXAM: LEFT TIBIA AND FIBULA - 2 VIEW COMPARISON:  None Available. FINDINGS: Left tibia and fibula Osteopenia.No acute fracture or dislocation in the proximal tibia and fibula. 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 Left ankle Osteopenia.Minimally displaced, obliquely oriented fracture of the distal fibula arising at the level of the tibial plafond. There is approximately 2 mm of lateral displacement. No ankle mortise widening. The talar dome is intact. There is no evidence of arthropathy or other focal bone abnormality. Moderate soft tissue swelling about the ankle. IMPRESSION: 1. Minimally displaced, obliquely oriented fracture of the distal fibula. No widening of the ankle mortise or  dislocation. 2. No acute fracture of the proximal tibia and fibula. Electronically Signed   By: Rogelia Myers M.D.   On: 08/14/2024 11:30   DG Ankle Complete Left Result Date: 08/14/2024 CLINICAL DATA:  fall EXAM: LEFT ANKLE COMPLETE - 3+ VIEW COMPARISON:  None Available. FINDINGS: Left tibia and fibula Osteopenia.No acute fracture or dislocation in the proximal tibia and fibula. 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 Left ankle Osteopenia.Minimally displaced, obliquely oriented fracture of the distal fibula arising at the level of the tibial plafond. There is approximately 2 mm of lateral displacement. No ankle mortise widening. The talar dome is intact. There is no evidence of arthropathy or other focal bone abnormality. Moderate soft tissue swelling about the ankle. IMPRESSION: 1. Minimally displaced, obliquely oriented fracture of the distal fibula. No widening of the ankle mortise or dislocation. 2. No acute fracture of the proximal tibia and fibula. Electronically Signed   By: Rogelia Myers M.D.   On: 08/14/2024 11:25   Procedures   Medications Ordered in the ED  Tdap (ADACEL) injection 0.5 mL (has no administration in time range)    Medical Decision Making Amount and/or Complexity of Data Reviewed Labs: ordered. Radiology: ordered.  Risk Prescription drug management.   68 y.o. female presents to the ER for evaluation of fall with ankle pain and head injury after mechanical fall. Differential diagnosis includes but is not limited to trauma, fracture, soft tissue injury. Vital signs mild elevated blood pressure otherwise unremarkable. Physical exam as noted above.   Patient does have some bruising seen to the left side of the crown.  No battle signs or raccoon eyes.  Otherwise neurological examination is unremarkable.  Will obtain CT imaging given head injury and age.  She does not have any midline tenderness of the neck however given Nexus criteria we will proceed  with CT imaging.  Will also obtain some labs.  I independently reviewed and interpreted the patient's labs.  CBC shows chronic anemia at baseline.  No leukocytosis.  CMP showed glucose of 119, creatinine 1.09, total protein 6.2.  Otherwise no other electrolyte or LFT abnormality.  CT head & cervical spine 1. No acute intracranial abnormality.  Atrophy. 2. Degenerative changes in the cervical spine without fracture or traumatic subluxation. 3. 1.5 cm left thyroid  nodule. Recommend thyroid  US   X-ray left ankle & tib/fib 1. Minimally displaced, obliquely oriented fracture of the distal fibula. No widening of the ankle mortise or dislocation. 2. No acute fracture of the proximal tibia and fibula.   Reviewed x-ray with my attending.  Reports cam boot and weightbearing as tolerated.  Patient has been ambulatory with her walker without the use of the cam boot.  She brought her walker with her and can be discharged to help with this in the cam boot.  She has just seen Dr. Kendal as she broke her right foot only few months prior.  She is neuro vastly intact distally.  She reports that she still has pain medication leftover from this fracture as well.  She is neurologically intact.  Benign neurological examination.  She is not experiencing any headache or vision changes.  Do not feel she needs any additional imaging.  I have updated her tetanus for the mild abrasions as well.  We discussed pain management at home as well as the RICE method and stressed the importance of follow-up with orthopedics. I have sent the patient's PCP a staff message in hopes of her being scheduled for outpatient US  of the thyroid  given the incidental finding.  She is stable for discharge home with close outpatient follow-up.  We discussed the results of the labs/imaging. The plan is follow up with orthopedics, supportive care. We discussed strict return precautions and red flag symptoms. The patient verbalized their understanding and  agrees to the plan. The patient is stable and being discharged home in good condition.  Portions of this report may have been transcribed using voice recognition software. Every effort was made to ensure accuracy; however, inadvertent computerized transcription errors may be present.    Final diagnoses:  Fall, initial encounter  Other closed fracture of distal end of left fibula, initial encounter  Injury of head, initial encounter    ED Discharge Orders     None          Bernis Ernst, PA-C 08/16/24 1744    Rogelia Jerilynn RAMAN, MD 08/27/24 [phone]

## 2024-08-14 NOTE — ED Triage Notes (Signed)
 Pt fell on Wednesday. Didn't feel dizzy or lightheaded, stumbled and then went down. Had had infusion the day before. Left ankle pain. Hit left side of head, leg hurts more, is having to use a walker to get around. No LOC. No thinners. 2 tylenol  around 0500

## 2024-08-14 NOTE — ED Notes (Signed)
 Pt left before Tdap administered

## 2024-08-14 NOTE — Progress Notes (Signed)
 Orthopedic Tech Progress Note Patient Details:  Jocelyn Kaufman 25-Sep-1956 [id number]  Ortho Devices Type of Ortho Device: CAM walker Ortho Device/Splint Location: LLE Ortho Device/Splint Interventions: Application   Post Interventions Patient Tolerated: Well Instructions Provided: Adjustment of device  Aleanna Menge E Tabria Steines 08/14/2024, 2:57 PM

## 2024-08-17 DIAGNOSIS — M8589 Other specified disorders of bone density and structure, multiple sites: Secondary | ICD-10-CM | POA: Diagnosis not present

## 2024-08-17 DIAGNOSIS — Z79899 Other long term (current) drug therapy: Secondary | ICD-10-CM | POA: Diagnosis not present

## 2024-08-17 DIAGNOSIS — M1711 Unilateral primary osteoarthritis, right knee: Secondary | ICD-10-CM | POA: Diagnosis not present

## 2024-08-17 DIAGNOSIS — M25569 Pain in unspecified knee: Secondary | ICD-10-CM | POA: Diagnosis not present

## 2024-08-17 DIAGNOSIS — Z23 Encounter for immunization: Secondary | ICD-10-CM | POA: Diagnosis not present

## 2024-08-17 DIAGNOSIS — M0579 Rheumatoid arthritis with rheumatoid factor of multiple sites without organ or systems involvement: Secondary | ICD-10-CM | POA: Diagnosis not present

## 2024-08-17 DIAGNOSIS — M1990 Unspecified osteoarthritis, unspecified site: Secondary | ICD-10-CM | POA: Diagnosis not present

## 2024-08-17 DIAGNOSIS — M79643 Pain in unspecified hand: Secondary | ICD-10-CM | POA: Diagnosis not present

## 2024-08-17 DIAGNOSIS — M797 Fibromyalgia: Secondary | ICD-10-CM | POA: Diagnosis not present

## 2024-08-17 DIAGNOSIS — D649 Anemia, unspecified: Secondary | ICD-10-CM | POA: Diagnosis not present

## 2024-08-18 ENCOUNTER — Encounter: Payer: Self-pay | Admitting: Internal Medicine

## 2024-08-18 ENCOUNTER — Other Ambulatory Visit: Payer: Self-pay | Admitting: Internal Medicine

## 2024-08-18 DIAGNOSIS — S82892A Other fracture of left lower leg, initial encounter for closed fracture: Secondary | ICD-10-CM | POA: Diagnosis not present

## 2024-08-18 DIAGNOSIS — S92411D Displaced fracture of proximal phalanx of right great toe, subsequent encounter for fracture with routine healing: Secondary | ICD-10-CM | POA: Diagnosis not present

## 2024-08-18 DIAGNOSIS — E041 Nontoxic single thyroid nodule: Secondary | ICD-10-CM

## 2024-08-20 ENCOUNTER — Other Ambulatory Visit: Payer: Self-pay | Admitting: *Deleted

## 2024-08-20 ENCOUNTER — Ambulatory Visit
Admission: RE | Admit: 2024-08-20 | Discharge: 2024-08-20 | Disposition: A | Discharge status: Home / Self Care | Source: Ambulatory Visit | Attending: Internal Medicine | Admitting: Internal Medicine

## 2024-08-20 ENCOUNTER — Encounter: Payer: Self-pay | Admitting: Hematology and Oncology

## 2024-08-20 DIAGNOSIS — E041 Nontoxic single thyroid nodule: Secondary | ICD-10-CM

## 2024-08-20 DIAGNOSIS — D638 Anemia in other chronic diseases classified elsewhere: Secondary | ICD-10-CM

## 2024-08-20 DIAGNOSIS — H903 Sensorineural hearing loss, bilateral: Secondary | ICD-10-CM | POA: Diagnosis not present

## 2024-08-21 ENCOUNTER — Inpatient Hospital Stay: Attending: Hematology and Oncology

## 2024-08-21 ENCOUNTER — Inpatient Hospital Stay

## 2024-08-21 VITALS — BP 143/69 | HR 80 | Resp 19

## 2024-08-21 DIAGNOSIS — N1831 Chronic kidney disease, stage 3a: Secondary | ICD-10-CM | POA: Diagnosis not present

## 2024-08-21 DIAGNOSIS — I129 Hypertensive chronic kidney disease with stage 1 through stage 4 chronic kidney disease, or unspecified chronic kidney disease: Secondary | ICD-10-CM | POA: Insufficient documentation

## 2024-08-21 DIAGNOSIS — D631 Anemia in chronic kidney disease: Secondary | ICD-10-CM

## 2024-08-21 DIAGNOSIS — D638 Anemia in other chronic diseases classified elsewhere: Secondary | ICD-10-CM

## 2024-08-21 DIAGNOSIS — D649 Anemia, unspecified: Secondary | ICD-10-CM

## 2024-08-21 DIAGNOSIS — E1122 Type 2 diabetes mellitus with diabetic chronic kidney disease: Secondary | ICD-10-CM | POA: Diagnosis present

## 2024-08-21 LAB — CBC WITH DIFFERENTIAL (CANCER CENTER ONLY)
Abs Immature Granulocytes: 0.01 K/uL (ref 0.00–0.07)
Basophils Absolute: 0.1 K/uL (ref 0.0–0.1)
Basophils Relative: 1 %
Eosinophils Absolute: 0.1 K/uL (ref 0.0–0.5)
Eosinophils Relative: 2 %
HCT: 35.4 % — ABNORMAL LOW (ref 36.0–46.0)
Hemoglobin: 10.6 g/dL — ABNORMAL LOW (ref 12.0–15.0)
Immature Granulocytes: 0 %
Lymphocytes Relative: 21 %
Lymphs Abs: 1.3 K/uL (ref 0.7–4.0)
MCH: 26.5 pg (ref 26.0–34.0)
MCHC: 29.9 g/dL — ABNORMAL LOW (ref 30.0–36.0)
MCV: 88.5 fL (ref 80.0–100.0)
Monocytes Absolute: 0.6 K/uL (ref 0.1–1.0)
Monocytes Relative: 10 %
Neutro Abs: 4.3 K/uL (ref 1.7–7.7)
Neutrophils Relative %: 66 %
Platelet Count: 211 K/uL (ref 150–400)
RBC: 4 MIL/uL (ref 3.87–5.11)
RDW: 15.4 % (ref 11.5–15.5)
WBC Count: 6.5 K/uL (ref 4.0–10.5)
nRBC: 0 % (ref 0.0–0.2)

## 2024-08-21 LAB — CMP (CANCER CENTER ONLY)
ALT: 13 U/L (ref 0–44)
AST: 17 U/L (ref 15–41)
Albumin: 4 g/dL (ref 3.5–5.0)
Alkaline Phosphatase: 90 U/L (ref 38–126)
Anion gap: 6 (ref 5–15)
BUN: 32 mg/dL — ABNORMAL HIGH (ref 8–23)
CO2: 25 mmol/L (ref 22–32)
Calcium: 9.2 mg/dL (ref 8.9–10.3)
Chloride: 109 mmol/L (ref 98–111)
Creatinine: 1.55 mg/dL — ABNORMAL HIGH (ref 0.44–1.00)
GFR, Estimated: 36 mL/min — ABNORMAL LOW (ref >60)
Glucose, Bld: 137 mg/dL — ABNORMAL HIGH (ref 70–99)
Potassium: 5 mmol/L (ref 3.5–5.1)
Sodium: 140 mmol/L (ref 135–145)
Total Bilirubin: 0.4 mg/dL (ref 0.0–1.2)
Total Protein: 7 g/dL (ref 6.5–8.1)

## 2024-08-21 LAB — IRON AND IRON BINDING CAPACITY (CC-WL,HP ONLY)
Iron: 63 ug/dL (ref 28–170)
Saturation Ratios: 23 % (ref 10.4–31.8)
TIBC: 273 ug/dL (ref 250–450)
UIBC: 210 ug/dL (ref 148–442)

## 2024-08-21 LAB — FERRITIN: Ferritin: 287 ng/mL (ref 11–307)

## 2024-08-21 MED ORDER — EPOETIN ALFA-EPBX 40000 UNIT/ML IJ SOLN
40000.0000 [IU] | Freq: Once | INTRAMUSCULAR | Status: AC
  Administered 2024-08-21: 40000 [IU] via SUBCUTANEOUS
  Filled 2024-08-21: qty 1

## 2024-08-24 ENCOUNTER — Other Ambulatory Visit (HOSPITAL_COMMUNITY): Payer: Self-pay

## 2024-08-24 MED ORDER — EZETIMIBE 10 MG PO TABS
10.0000 mg | ORAL_TABLET | Freq: Every day | ORAL | 3 refills | Status: DC
  Filled 2024-08-24: qty 90, 90d supply, fill #0

## 2024-08-26 ENCOUNTER — Ambulatory Visit: Admitting: Podiatry

## 2024-08-26 DIAGNOSIS — E1142 Type 2 diabetes mellitus with diabetic polyneuropathy: Secondary | ICD-10-CM

## 2024-08-26 DIAGNOSIS — B351 Tinea unguium: Secondary | ICD-10-CM | POA: Diagnosis not present

## 2024-08-26 DIAGNOSIS — M79675 Pain in left toe(s): Secondary | ICD-10-CM | POA: Diagnosis not present

## 2024-08-26 DIAGNOSIS — M79674 Pain in right toe(s): Secondary | ICD-10-CM

## 2024-08-26 NOTE — Progress Notes (Signed)
 Subjective:  Patient ID: Jocelyn Kaufman, female    DOB: Jan 23, 1956,  MRN: [id number]  Jocelyn Kaufman presents to clinic today for at risk foot care with history of diabetic neuropathy and painful, discolored, thick toenails which interfere with daily activities. Pateitn wearing CamBoot LLE. Recovering from fibular fracture LLE sustained after a fall on 08/14/24. Evaluated at [facility] ED. She is being managed by Dr. Kendal, Orthopedic Surgeon. Chief Complaint  Patient presents with   Diabetes    DFC IDDM A1C 6.2. Toenail trim.LOV with PCP 04/21/24   New problem(s): None.   PCP is Clarice Nottingham, MD.  Allergies  Allergen Reactions   Semaglutide (0.25 Or 0.5mg -Dos) Other (See Comments)    Causes cataracts requiring surgery and impairing vision    Shellfish Protein-Containing Drug Products Anaphylaxis, Swelling and Other (See Comments)    Patient avoids   Empagliflozin Itching, Other (See Comments) and Swelling    UTI sx's and yeast infections   Semaglutide  Other (See Comments)    Other reaction(s): worsening DM RETINOPATHY   Tramadol Hcl Other (See Comments)    hallucinations   Canagliflozin Other (See Comments)    Urinary incontinence    Gemtesa  [Vibegron ] Rash    Itching all over    Latex Rash and Other (See Comments)   Liraglutide Itching    Other reaction(s): itching all over   Minoxidil Rash    skin irritation   Omeprazole Rash and Other (See Comments)   Percocet [Oxycodone -Acetaminophen ] Nausea And Vomiting    Review of Systems: Negative except as noted in the HPI.  Objective: No changes noted in today's physical examination. There were no vitals filed for this visit. Jocelyn Kaufman is a pleasant 68 y.o. female in NAD. AAO x 3.   Vascular Examination: Capillary refill time immediate b/l. Vascular status intact b/l with palpable pedal pulses. Pedal hair present b/l. No pain with calf compression b/l. Skin temperature gradient WNL b/l. No cyanosis or clubbing b/l. No ischemia or  gangrene noted b/l.   Neurological Examination: Sensation grossly intact b/l with 10 gram monofilament. Vibratory sensation intact b/l. Pt has subjective symptoms of neuropathy.  Dermatological Examination: Pedal skin with normal turgor, texture and tone b/l.  No open wounds. No interdigital macerations.   Toenails 1-5 b/l thick, discolored, elongated with subungual debris and pain on dorsal palpation.   No corns, calluses nor porokeratotic lesions noted.  Musculoskeletal Examination: Muscle strength testing deferred. Wearing CamBook LLE. 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  Radiographs: None Assessment/Plan: 1. Pain due to onychomycosis of toenails of both feet   2. Diabetic peripheral neuropathy associated with type 2 diabetes mellitus (HCC)   Patient was evaluated and treated. All patient's and/or POA's questions/concerns addressed on today's visit. Toenails 1-5 b/l debrided in length and girth without incident. Continue foot and shoe inspections daily. Monitor blood glucose per PCP/Endocrinologist's recommendations. Continue soft, supportive shoe gear daily. Report any pedal injuries to medical professional. Call office if there are any questions/concerns. -Patient/POA to call should there be question/concern in the interim.   Return in about 3 months (around 11/26/2024).  Delon LITTIE Merlin, DPM      [place] LOCATION: [address] [place], [place] [address]  Office [phone]   [facility] LOCATION: [address] [place], [place] [address] Office [phone]

## 2024-08-30 ENCOUNTER — Encounter: Payer: Self-pay | Admitting: Podiatry

## 2024-08-31 ENCOUNTER — Other Ambulatory Visit: Payer: Self-pay

## 2024-08-31 ENCOUNTER — Other Ambulatory Visit (HOSPITAL_COMMUNITY): Payer: Self-pay

## 2024-08-31 MED ORDER — HYDRALAZINE HCL 50 MG PO TABS
50.0000 mg | ORAL_TABLET | Freq: Three times a day (TID) | ORAL | 3 refills | Status: AC
  Filled 2024-08-31: qty 270, 90d supply, fill #0

## 2024-09-01 ENCOUNTER — Other Ambulatory Visit (HOSPITAL_COMMUNITY): Payer: Self-pay

## 2024-09-01 DIAGNOSIS — H401131 Primary open-angle glaucoma, bilateral, mild stage: Secondary | ICD-10-CM | POA: Diagnosis not present

## 2024-09-01 DIAGNOSIS — N1831 Chronic kidney disease, stage 3a: Secondary | ICD-10-CM | POA: Diagnosis not present

## 2024-09-01 MED ORDER — LATANOPROST 0.005 % OP SOLN
1.0000 [drp] | Freq: Every day | OPHTHALMIC | 6 refills | Status: DC
  Filled 2024-09-01: qty 7.5, 75d supply, fill #0

## 2024-09-03 DIAGNOSIS — N3946 Mixed incontinence: Secondary | ICD-10-CM | POA: Diagnosis not present

## 2024-09-07 ENCOUNTER — Other Ambulatory Visit (HOSPITAL_COMMUNITY): Payer: Self-pay

## 2024-09-07 ENCOUNTER — Other Ambulatory Visit: Payer: Self-pay

## 2024-09-07 DIAGNOSIS — R809 Proteinuria, unspecified: Secondary | ICD-10-CM | POA: Diagnosis not present

## 2024-09-07 DIAGNOSIS — E1122 Type 2 diabetes mellitus with diabetic chronic kidney disease: Secondary | ICD-10-CM | POA: Diagnosis not present

## 2024-09-07 DIAGNOSIS — N1831 Chronic kidney disease, stage 3a: Secondary | ICD-10-CM | POA: Diagnosis not present

## 2024-09-07 DIAGNOSIS — I129 Hypertensive chronic kidney disease with stage 1 through stage 4 chronic kidney disease, or unspecified chronic kidney disease: Secondary | ICD-10-CM | POA: Diagnosis not present

## 2024-09-07 DIAGNOSIS — E875 Hyperkalemia: Secondary | ICD-10-CM | POA: Diagnosis not present

## 2024-09-07 MED ORDER — DAPAGLIFLOZIN PROPANEDIOL 5 MG PO TABS
5.0000 mg | ORAL_TABLET | Freq: Every day | ORAL | 3 refills | Status: AC
  Filled 2024-09-07: qty 90, 90d supply, fill #0

## 2024-09-08 ENCOUNTER — Other Ambulatory Visit: Payer: Self-pay | Admitting: Internal Medicine

## 2024-09-08 DIAGNOSIS — E049 Nontoxic goiter, unspecified: Secondary | ICD-10-CM

## 2024-09-08 DIAGNOSIS — S8262XA Displaced fracture of lateral malleolus of left fibula, initial encounter for closed fracture: Secondary | ICD-10-CM | POA: Diagnosis not present

## 2024-09-09 ENCOUNTER — Other Ambulatory Visit (HOSPITAL_COMMUNITY): Payer: Self-pay

## 2024-09-09 MED ORDER — CLINDAMYCIN PHOSPHATE 1 % EX SOLN
Freq: Two times a day (BID) | CUTANEOUS | 5 refills | Status: AC | PRN
  Filled 2024-09-09: qty 60, 30d supply, fill #0

## 2024-09-11 DIAGNOSIS — N3281 Overactive bladder: Secondary | ICD-10-CM | POA: Diagnosis not present

## 2024-09-11 DIAGNOSIS — R351 Nocturia: Secondary | ICD-10-CM | POA: Diagnosis not present

## 2024-09-11 DIAGNOSIS — R350 Frequency of micturition: Secondary | ICD-10-CM | POA: Diagnosis not present

## 2024-09-14 ENCOUNTER — Other Ambulatory Visit: Payer: Self-pay | Admitting: Hematology and Oncology

## 2024-09-14 ENCOUNTER — Encounter: Payer: Self-pay | Admitting: Hematology and Oncology

## 2024-09-21 ENCOUNTER — Inpatient Hospital Stay: Attending: Hematology and Oncology

## 2024-09-21 ENCOUNTER — Inpatient Hospital Stay

## 2024-09-21 VITALS — BP 155/71 | HR 76 | Resp 18

## 2024-09-21 DIAGNOSIS — E1122 Type 2 diabetes mellitus with diabetic chronic kidney disease: Secondary | ICD-10-CM | POA: Insufficient documentation

## 2024-09-21 DIAGNOSIS — D638 Anemia in other chronic diseases classified elsewhere: Secondary | ICD-10-CM

## 2024-09-21 DIAGNOSIS — D631 Anemia in chronic kidney disease: Secondary | ICD-10-CM | POA: Diagnosis not present

## 2024-09-21 DIAGNOSIS — N1831 Chronic kidney disease, stage 3a: Secondary | ICD-10-CM | POA: Diagnosis not present

## 2024-09-21 DIAGNOSIS — D649 Anemia, unspecified: Secondary | ICD-10-CM

## 2024-09-21 DIAGNOSIS — N183 Chronic kidney disease, stage 3 unspecified: Secondary | ICD-10-CM

## 2024-09-21 LAB — CMP (CANCER CENTER ONLY)
ALT: 17 U/L (ref 0–44)
AST: 22 U/L (ref 15–41)
Albumin: 4.1 g/dL (ref 3.5–5.0)
Alkaline Phosphatase: 93 U/L (ref 38–126)
Anion gap: 8 (ref 5–15)
BUN: 16 mg/dL (ref 8–23)
CO2: 26 mmol/L (ref 22–32)
Calcium: 9.5 mg/dL (ref 8.9–10.3)
Chloride: 107 mmol/L (ref 98–111)
Creatinine: 1.07 mg/dL — ABNORMAL HIGH (ref 0.44–1.00)
GFR, Estimated: 56 mL/min — ABNORMAL LOW (ref >60)
Glucose, Bld: 91 mg/dL (ref 70–99)
Potassium: 4.3 mmol/L (ref 3.5–5.1)
Sodium: 141 mmol/L (ref 135–145)
Total Bilirubin: 0.4 mg/dL (ref 0.0–1.2)
Total Protein: 6.9 g/dL (ref 6.5–8.1)

## 2024-09-21 LAB — CBC WITH DIFFERENTIAL (CANCER CENTER ONLY)
Abs Immature Granulocytes: 0.01 K/uL (ref 0.00–0.07)
Basophils Absolute: 0.1 K/uL (ref 0.0–0.1)
Basophils Relative: 1 %
Eosinophils Absolute: 0.1 K/uL (ref 0.0–0.5)
Eosinophils Relative: 2 %
HCT: 33 % — ABNORMAL LOW (ref 36.0–46.0)
Hemoglobin: 10.1 g/dL — ABNORMAL LOW (ref 12.0–15.0)
Immature Granulocytes: 0 %
Lymphocytes Relative: 22 %
Lymphs Abs: 1.3 K/uL (ref 0.7–4.0)
MCH: 26.9 pg (ref 26.0–34.0)
MCHC: 30.6 g/dL (ref 30.0–36.0)
MCV: 87.8 fL (ref 80.0–100.0)
Monocytes Absolute: 0.5 K/uL (ref 0.1–1.0)
Monocytes Relative: 9 %
Neutro Abs: 4 K/uL (ref 1.7–7.7)
Neutrophils Relative %: 66 %
Platelet Count: 177 K/uL (ref 150–400)
RBC: 3.76 MIL/uL — ABNORMAL LOW (ref 3.87–5.11)
RDW: 15.9 % — ABNORMAL HIGH (ref 11.5–15.5)
WBC Count: 6 K/uL (ref 4.0–10.5)
nRBC: 0 % (ref 0.0–0.2)

## 2024-09-21 LAB — IRON AND IRON BINDING CAPACITY (CC-WL,HP ONLY)
Iron: 72 ug/dL (ref 28–170)
Saturation Ratios: 26 % (ref 10.4–31.8)
TIBC: 276 ug/dL (ref 250–450)
UIBC: 204 ug/dL

## 2024-09-21 LAB — FERRITIN: Ferritin: 310 ng/mL — ABNORMAL HIGH (ref 11–307)

## 2024-09-21 MED ORDER — EPOETIN ALFA-EPBX 40000 UNIT/ML IJ SOLN
40000.0000 [IU] | Freq: Once | INTRAMUSCULAR | Status: AC
  Administered 2024-09-21: 40000 [IU] via SUBCUTANEOUS
  Filled 2024-09-21: qty 1

## 2024-09-25 ENCOUNTER — Other Ambulatory Visit (HOSPITAL_COMMUNITY): Payer: Self-pay

## 2024-09-25 ENCOUNTER — Encounter (HOSPITAL_BASED_OUTPATIENT_CLINIC_OR_DEPARTMENT_OTHER): Payer: Self-pay

## 2024-09-28 ENCOUNTER — Other Ambulatory Visit (HOSPITAL_COMMUNITY): Payer: Self-pay

## 2024-09-28 DIAGNOSIS — I5032 Chronic diastolic (congestive) heart failure: Secondary | ICD-10-CM | POA: Diagnosis not present

## 2024-09-28 DIAGNOSIS — E785 Hyperlipidemia, unspecified: Secondary | ICD-10-CM | POA: Diagnosis not present

## 2024-09-28 DIAGNOSIS — I2510 Atherosclerotic heart disease of native coronary artery without angina pectoris: Secondary | ICD-10-CM | POA: Diagnosis not present

## 2024-09-28 LAB — LIPID PANEL
Chol/HDL Ratio: 1.5 ratio (ref 0.0–4.4)
Cholesterol, Total: 114 mg/dL (ref 100–199)
HDL: 76 mg/dL (ref >39)
LDL Chol Calc (NIH): 24 mg/dL (ref 0–99)
Triglycerides: 65 mg/dL (ref 0–149)
VLDL Cholesterol Cal: 14 mg/dL (ref 5–40)

## 2024-09-29 ENCOUNTER — Ambulatory Visit (HOSPITAL_BASED_OUTPATIENT_CLINIC_OR_DEPARTMENT_OTHER): Payer: Self-pay | Admitting: Family

## 2024-09-29 NOTE — Telephone Encounter (Signed)
-----   Message from Reche GORMAN Finder sent at 09/29/2024 10:35 AM EST ----- Cholesterol panel looks fantastic! LDL (bad cholesterol)  has improved from 94 to 24 which is at goal less than 70. Good result! Continue Repatha , Atorvastatin . As cholesterol numbers look so good,  okay to discontinue Zetia .  ----- Message ----- From: Interface, [facility] Lab Results In Sent: 09/28/2024  11:36 PM EST To: Reche GORMAN Finder, NP

## 2024-09-29 NOTE — Telephone Encounter (Signed)
 Pt made aware of results via mchart.    Pt did view this in mychart and responded. Last read by Darice MALVA Berger at 11:17AM on 09/29/2024.   Discontinued zetia  from pts medication list.

## 2024-10-01 ENCOUNTER — Ambulatory Visit
Admission: RE | Admit: 2024-10-01 | Discharge: 2024-10-01 | Disposition: A | Source: Ambulatory Visit | Attending: Internal Medicine | Admitting: Internal Medicine

## 2024-10-01 ENCOUNTER — Encounter: Payer: Self-pay | Admitting: Hematology and Oncology

## 2024-10-01 ENCOUNTER — Other Ambulatory Visit (HOSPITAL_COMMUNITY)
Admission: RE | Admit: 2024-10-01 | Discharge: 2024-10-01 | Disposition: A | Source: Ambulatory Visit | Attending: Interventional Radiology | Admitting: Interventional Radiology

## 2024-10-01 DIAGNOSIS — E049 Nontoxic goiter, unspecified: Secondary | ICD-10-CM

## 2024-10-01 DIAGNOSIS — E041 Nontoxic single thyroid nodule: Secondary | ICD-10-CM | POA: Insufficient documentation

## 2024-10-02 LAB — CYTOLOGY - NON PAP

## 2024-10-05 ENCOUNTER — Other Ambulatory Visit (HOSPITAL_COMMUNITY): Payer: Self-pay

## 2024-10-05 MED ORDER — ESCITALOPRAM OXALATE 20 MG PO TABS
20.0000 mg | ORAL_TABLET | Freq: Every day | ORAL | 3 refills | Status: AC
  Filled 2024-10-05: qty 90, 90d supply, fill #0

## 2024-10-06 ENCOUNTER — Other Ambulatory Visit (HOSPITAL_COMMUNITY): Payer: Self-pay

## 2024-10-06 DIAGNOSIS — S8262XD Displaced fracture of lateral malleolus of left fibula, subsequent encounter for closed fracture with routine healing: Secondary | ICD-10-CM | POA: Diagnosis not present

## 2024-10-15 ENCOUNTER — Ambulatory Visit: Admitting: Adult Health

## 2024-10-15 ENCOUNTER — Encounter: Payer: Self-pay | Admitting: Adult Health

## 2024-10-15 VITALS — BP 145/71 | HR 72 | Ht 64.5 in | Wt 222.0 lb

## 2024-10-15 DIAGNOSIS — G4733 Obstructive sleep apnea (adult) (pediatric): Secondary | ICD-10-CM | POA: Diagnosis not present

## 2024-10-15 NOTE — Patient Instructions (Signed)
 Your Plan:  Continue using CPAP nightly and greater than 4 hours each night If your symptoms worsen or you develop new symptoms please let us  know.       Thank you for coming to see us  at [facility]. I hope we have been able to provide you high quality care today.  You may receive a patient satisfaction survey over the next few weeks. We would appreciate your feedback and comments so that we may continue to improve ourselves and the health of our patients.

## 2024-10-15 NOTE — Progress Notes (Signed)
 PATIENT: Jocelyn Kaufman DOB: 01-08-1956  REASON FOR VISIT: follow up HISTORY FROM: patient PRIMARY NEUROLOGIST: Dr. Buck   Chief Complaint  Patient presents with   Follow-up    Patient in room 4 alone Patient here for autopap follow up. Patient states she has a lot of leakage with the machine.  ESS score 5     HISTORY OF PRESENT ILLNESS: Today 10/15/2024:  Jocelyn Kaufman is a 68 y.o. female with a history of obstructive sleep apnea on CPAP. Returns today for follow-up.  She had a repeat sleep study that showed severe sleep apnea.  She was given a new machine.  She states that she liked her old machine better.  She has found that the new machine shows that she has a leak.  The leak is not always bothersome to her.  Currently has the nasal pillows.  Her download is below      REVIEW OF SYSTEMS: Out of a complete 14 system review of symptoms, the patient complains only of the following symptoms, and all other reviewed systems are negative.  ESS 5  ALLERGIES: Allergies[1]  HOME MEDICATIONS: Outpatient Medications Prior to Visit  Medication Sig Dispense Refill   ACCU-CHEK GUIDE test strip      Accu-Chek Softclix Lancets lancets use to check blood sugar     Aflibercept  (EYLEA ) 2 MG/0.05ML SOLN 2 mg by Intravitreal route every 3 (three) months.     albuterol  (VENTOLIN  HFA) 108 (90 Base) MCG/ACT inhaler Inhale two puffs every four to six hours as needed for cough or wheeze. 18 g 2   amLODipine  (NORVASC ) 5 MG tablet Take 1 tablet (5 mg total) by mouth daily. 90 tablet 3   aspirin  EC 81 MG tablet Take 81 mg by mouth daily. Swallow whole.     atorvastatin  (LIPITOR) 40 MG tablet Take 1 tablet (40 mg total) by mouth daily at 6 PM. 30 tablet 0   Azelaic Acid (FINACEA) 15 % FOAM Apply topically. Apply to scalp twice daily     B-D ULTRAFINE III SHORT PEN 31G X 8 MM MISC SMARTSIG:1 Each SUB-Q Daily     CHELATED IRON PO Take 18 mg by mouth daily.     Cholecalciferol  (VITAMIN D3) 125 MCG  (5000 UT) CAPS Take 5,000 Units by mouth daily.     clindamycin  (CLEOCIN  T) 1 % external solution Apply to underarm daily to twice as needed.     CLOBETASOL PROPIONATE E 0.05 % emollient cream Apply topically 3 (three) times daily.     dapagliflozin  propanediol (FARXIGA ) 5 MG TABS tablet Take 1 tablet (5 mg total) by mouth daily before breakfast.     Dulaglutide  (TRULICITY ) 1.5 MG/0.5ML SOAJ Inject 1.5 mg into the skin once a week. 6 mL 1   EPINEPHrine  (EPIPEN  2-PAK) 0.3 mg/0.3 mL IJ SOAJ injection Use as directed for life-threatening allergic reaction. 2 each 1   epoetin  alfa-epbx (RETACRIT ) 2000 UNIT/ML injection See admin instructions.     escitalopram  (LEXAPRO ) 20 MG tablet Take 1 tablet (20 mg total) by mouth daily. 90 tablet 3   Evolocumab  (REPATHA  SURECLICK) 140 MG/ML SOAJ Inject 140 mg into the skin every 14 (fourteen) days. 6 mL 3   fexofenadine  (ALLEGRA  ALLERGY) 180 MG tablet Take 1 tablet (180 mg total) by mouth daily.     folic acid  (FOLVITE ) 1 MG tablet Take 1 mg by mouth 2 (two) times daily.     furosemide  (LASIX ) 20 MG tablet Take one tablet daily for  2 days then as needed for fluid retention. If taking more than 3 times per week, please let cardiology team know. 90 tablet 3   hydrALAZINE  (APRESOLINE ) 50 MG tablet Take 1 tablet (50 mg total) by mouth in the morning, at noon, and at bedtime. 270 tablet 3   HYDROcodone -acetaminophen  (NORCO/VICODIN) 5-325 MG tablet Take 1 tablet by mouth every 4 (four) hours as needed for moderate pain (pain score 4-6) or severe pain (pain score 7-10). 8 tablet 0   hydrocortisone cream 1 % Apply 1 Application topically 2 (two) times daily as needed for itching.     inFLIXimab  (REMICADE ) 100 MG injection Inject into the vein.     insulin  aspart (NOVOLOG ) 100 UNIT/ML FlexPen Inject into the skin See admin instructions. Sliding Scale     insulin  glargine, 2 Unit Dial, (TOUJEO  MAX SOLOSTAR) 300 UNIT/ML Solostar Pen Inject 42 Units into the skin at bedtime.      irbesartan  (AVAPRO ) 300 MG tablet Take 1 tablet (300 mg total) by mouth daily.     KERENDIA 10 MG TABS 10 mg daily.     Lancets Misc. (ACCU-CHEK SOFTCLIX LANCET DEV) KIT See admin instructions.     latanoprost  (XALATAN ) 0.005 % ophthalmic solution Place 1 drop into both eyes at bedtime.     leflunomide  (ARAVA ) 20 MG tablet Take 1 tablet (20 mg total) by mouth daily. 90 tablet 0   lidocaine  (LMX) 4 % cream Apply 1 Application topically as needed (pain).     metoprolol  succinate (TOPROL -XL) 25 MG 24 hr tablet Take 1.5 tablets (37.5 mg total) by mouth daily. May take additional half tablet as needed once daily for palpitations. 135 tablet 3   Multiple Vitamins-Minerals (CENTRUM SILVER  PO) Take 1 tablet by mouth daily with breakfast.     sulfaSALAzine  (AZULFIDINE ) 500 MG tablet Take 3 tablets (1,500 mg total) by mouth 2 (two) times daily.     SYNTHROID  125 MCG tablet Take 125 mcg by mouth every Monday, Tuesday, Wednesday, Thursday, and Friday.     triamcinolone  cream (KENALOG ) 0.1 % Apply 1 Application topically 2 (two) times daily as needed (for itching- affected areas).     vitamin B-12 (CYANOCOBALAMIN ) 1000 MCG tablet Take 1,000 mcg by mouth daily.     amLODipine  (NORVASC ) 5 MG tablet Take 5 mg by mouth daily. (Patient not taking: Reported on 10/15/2024)     atorvastatin  (LIPITOR) 40 MG tablet Take 1 tablet (40 mg total) by mouth daily. (Patient not taking: Reported on 10/15/2024) 90 tablet 1   dapagliflozin  propanediol (FARXIGA ) 5 MG TABS tablet Take 1 tablet (5 mg total) by mouth daily. (Patient not taking: Reported on 10/15/2024) 90 tablet 3   escitalopram  (LEXAPRO ) 20 MG tablet Take 20 mg by mouth daily. (Patient not taking: Reported on 10/15/2024)     hydrALAZINE  (APRESOLINE ) 50 MG tablet Take 50 mg by mouth 3 (three) times daily. (Patient not taking: Reported on 10/15/2024)     latanoprost  (XALATAN ) 0.005 % ophthalmic solution INSTILL 1 DROP IN BOTH EYES EVERYDAY AT BEDTIME (Patient not  taking: Reported on 10/15/2024) 7.5 mL 6   oxybutynin  (DITROPAN -XL) 10 MG 24 hr tablet Take 1 tablet (10 mg total) by mouth daily. (Patient not taking: Reported on 10/15/2024) 30 tablet 11   sulfaSALAzine  (AZULFIDINE ) 500 MG tablet Take 1 tablet (500 mg total) by mouth 2 (two) times daily. (Patient not taking: Reported on 10/15/2024) 180 tablet 0   No facility-administered medications prior to visit.    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   Urticaria     PAST SURGICAL HISTORY: Past Surgical History:  Procedure Laterality Date   ADENOIDECTOMY     APPENDECTOMY     biopsy Left 12/2021   shin   biospy Right 04/2021   R 5th digit   biospy Left 04/2021   foot plantar surface   colonoscopy and endoscopy  2021   CYST REMOVAL LEG Right    Removed from right thigh joint   EYE SURGERY Bilateral 09/2021   cataract   foot surgery Left 09/2021   Excision lesion plantar left foot   LIGAMENT REPAIR Right 09/26/2022   Procedure: LIGAMENT REPAIR/knee;  Surgeon: Cristy Bonner DASEN, MD;  Location: [facility] OR;  Service: Orthopedics;  Laterality: Right;   ORIF HUMERUS FRACTURE Right 07/09/2018   Procedure: OPEN REDUCTION INTERNAL FIXATION (ORIF) HUMERAL SHAFT FRACTURE;  Surgeon: Cristy Bonner DASEN, MD;  Location: [facility] OR;  Service: Orthopedics;  Laterality: Right;   QUADRICEPS TENDON REPAIR Right 09/26/2022   Procedure: REVISION REPAIR QUADRICEP  TENDON;  Surgeon: Cristy Bonner DASEN, MD;  Location: [facility] OR;  Service: Orthopedics;  Laterality: Right;   TONSILLECTOMY     torn right knee tendon repair Right    2023   TOTAL VAGINAL HYSTERECTOMY     UMBILICAL HERNIA REPAIR      FAMILY HISTORY: Family History  Problem Relation Age of Onset   Hypertension Mother    Stroke Mother    Kidney failure Mother    Heart disease Mother        heart murmur   Glaucoma Mother    Stroke Father    COPD Father    Cancer - Prostate Father    Colon cancer Father    Diabetes Sister    Thyroid  cancer Sister    Epilepsy Sister    Ovarian cancer Paternal Aunt     SOCIAL HISTORY: Social History   Socioeconomic History   Marital status: Single    Spouse name: Not on file   Number of children: 0   Years of education: Masters   Highest education level: Not on file  Occupational History   Occupation: [employer]  Tobacco Use   Smoking status: Never   Smokeless tobacco: Never  Vaping Use   Vaping status: Never Used  Substance and Sexual Activity   Alcohol use: Not Currently    Comment: occ   Drug use: No   Sexual activity: Not Currently  Other Topics Concern   Not on file  Social History Narrative   Lives alone in a one story home   Caffeine  use: 2 cups coffee per day   Tea sometimes   Soda-  once every other week   Right-handed   Retired.    Masters Degree  No Children   Single.   Social Drivers of Health   Tobacco Use: Low Risk (10/15/2024)   Patient History    Smoking Tobacco Use: Never    Smokeless Tobacco Use: Never    Passive Exposure: Not on file  Financial Resource Strain: Low Risk (04/25/2023)   Overall Financial Resource Strain (CARDIA)    Difficulty of Paying Living Expenses: Not very hard  Food Insecurity: No Food Insecurity (04/25/2023)   Hunger Vital Sign    Worried About Running Out of Food in the Last Year: Never true    Ran Out of Food in the Last Year: Never true  Transportation Needs: No  Transportation Needs (04/25/2023)   PRAPARE - Administrator, Civil Service (Medical): No    Lack of Transportation (Non-Medical): No  Physical Activity: Not on file  Stress: Stress Concern Present (04/25/2023)   [employer] of Occupational Health - Occupational Stress Questionnaire    Feeling of Stress : Rather much  Social Connections: Not on file  Intimate Partner Violence: Not At Risk (09/27/2022)   Humiliation, Afraid, Rape, and Kick questionnaire    Fear of Current or Ex-Partner: No    Emotionally Abused: No    Physically Abused: No    Sexually Abused: No  Depression (PHQ2-9): Not on file  Alcohol Screen: Not on file  Housing: Low Risk (04/25/2023)   Housing    Last Housing Risk Score: 0  Utilities: Not At Risk (04/25/2023)   AHC Utilities    Threatened with loss of utilities: No  Health Literacy: Not on file      PHYSICAL EXAM  Vitals:   10/15/24 1137  BP: (!) 145/71  Pulse: 72  Weight: 222 lb (100.7 kg)  Height: 5' 4.5 (1.638 m)   Body mass index is 37.52 kg/m.  Generalized: Well developed, in no acute distress  Chest: Lungs clear to auscultation bilaterally  Neurological examination  Mentation: Alert oriented to time, place, history taking. Follows all commands speech and language fluent Cranial nerve II-XII: Extraocular movements were full, visual field were full on confrontational test Head turning and shoulder shrug  were normal and symmetric. Motor: The motor testing reveals 5 over 5 strength of all 4 extremities. Good symmetric motor tone is noted throughout.  Sensory: Sensory testing is intact to soft touch on all 4 extremities. No evidence of extinction is noted.  Gait and station: Gait is normal.    DIAGNOSTIC DATA (LABS, IMAGING, TESTING) - I reviewed patient records, labs, notes, testing and imaging myself where available.  Lab Results  Component Value Date   WBC 6.0 09/21/2024   HGB 10.1 (L) 09/21/2024   HCT 33.0 (L)  09/21/2024   MCV 87.8 09/21/2024   PLT 177 09/21/2024      Component Value Date/Time   NA 141 09/21/2024 0903   NA 140 10/07/2018 0908   K 4.3 09/21/2024 0903   CL 107 09/21/2024 0903   CO2 26 09/21/2024 0903   GLUCOSE 91 09/21/2024 0903   BUN 16 09/21/2024 0903   BUN 26 10/07/2018 0908   CREATININE 1.07 (H) 09/21/2024 0903   CALCIUM  9.5 09/21/2024 0903   PROT 6.9 09/21/2024 0903   PROT 6.3 04/29/2017 1325   ALBUMIN 4.1 09/21/2024 0903   AST 22 09/21/2024 0903   ALT 17 09/21/2024 0903   ALKPHOS 93 09/21/2024 0903   BILITOT 0.4 09/21/2024 0903   GFRNONAA 56 (L) 09/21/2024 0903   GFRAA 60 10/07/2018 0908   Lab Results  Component Value Date   CHOL 114 09/28/2024   HDL 76 09/28/2024   LDLCALC 24 09/28/2024   TRIG 65 09/28/2024   CHOLHDL 1.5 09/28/2024   Lab Results  Component Value Date   HGBA1C 5.6 08/21/2022   Lab Results  Component Value Date   VITAMINB12 901 10/08/2023   No results found for: TSH    ASSESSMENT AND PLAN 68 y.o. 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 here with:  OSA on CPAP  - CPAP compliance excellent - Residual AHI slightly elevated I will adjust pressure 11 to 16 cm of water.  In the future may try a set pressure -Patient does have leak with the mask.  Mask refitting ordered. - Encourage patient to use CPAP nightly and > 4 hours each night - F/U in 1 year or sooner if needed  Orders Placed This Encounter  Procedures   For home use only DME continuous positive airway pressure (CPAP)     Mask refitting  Increase Pressure11-16 cmh20    Length of Need:   12 Months    Patient has OSA or probable OSA:   Yes    Is the patient currently using CPAP in the home:   Yes    Settings:   Other see comments    CPAP supplies needed:   Mask, headgear, cushions, filters, heated tubing and water chamber      Duwaine Russell, MSN, NP-C 10/15/2024, 12:59 PM [facility] [address] [place], [place] [address] [phone]      [1]  Allergies Allergen Reactions   Semaglutide (0.25 Or 0.5mg -Dos) Other (See Comments)    Causes cataracts requiring surgery and impairing vision    Shellfish Protein-Containing Drug Products Anaphylaxis, Swelling and Other (See Comments)    Patient avoids   Empagliflozin Itching, Other (See Comments) and Swelling    UTI sx's and yeast infections   Semaglutide  Other (See Comments)    Other reaction(s): worsening DM RETINOPATHY   Tramadol Hcl Other (See Comments)    hallucinations   Canagliflozin Other (See Comments)    Urinary incontinence    Gemtesa  [Vibegron ] Rash    Itching all over    Latex Rash and Other (See Comments)   Liraglutide Itching    Other reaction(s): itching all over   Minoxidil Rash    skin irritation   Omeprazole Rash and Other (See Comments)   Percocet [Oxycodone -Acetaminophen ] Nausea And Vomiting

## 2024-10-16 ENCOUNTER — Other Ambulatory Visit (HOSPITAL_COMMUNITY): Payer: Self-pay

## 2024-10-19 ENCOUNTER — Other Ambulatory Visit (HOSPITAL_COMMUNITY): Payer: Self-pay

## 2024-10-19 MED ORDER — SULFASALAZINE 500 MG PO TABS
500.0000 mg | ORAL_TABLET | Freq: Two times a day (BID) | ORAL | 0 refills | Status: AC
  Filled 2024-10-19 – 2024-10-26 (×2): qty 180, 90d supply, fill #0

## 2024-10-19 MED ORDER — LEFLUNOMIDE 20 MG PO TABS
20.0000 mg | ORAL_TABLET | Freq: Every day | ORAL | 0 refills | Status: AC
  Filled 2024-10-19: qty 90, 90d supply, fill #0

## 2024-10-21 ENCOUNTER — Inpatient Hospital Stay

## 2024-10-21 ENCOUNTER — Inpatient Hospital Stay: Attending: Hematology and Oncology | Admitting: Hematology and Oncology

## 2024-10-21 ENCOUNTER — Other Ambulatory Visit (HOSPITAL_COMMUNITY): Payer: Self-pay

## 2024-10-21 VITALS — BP 118/78 | HR 73 | Temp 97.6°F | Resp 18 | Ht 64.0 in | Wt 227.6 lb

## 2024-10-21 DIAGNOSIS — D638 Anemia in other chronic diseases classified elsewhere: Secondary | ICD-10-CM

## 2024-10-21 DIAGNOSIS — E1122 Type 2 diabetes mellitus with diabetic chronic kidney disease: Secondary | ICD-10-CM | POA: Diagnosis present

## 2024-10-21 DIAGNOSIS — D631 Anemia in chronic kidney disease: Secondary | ICD-10-CM | POA: Insufficient documentation

## 2024-10-21 DIAGNOSIS — N1831 Chronic kidney disease, stage 3a: Secondary | ICD-10-CM | POA: Diagnosis present

## 2024-10-21 DIAGNOSIS — D649 Anemia, unspecified: Secondary | ICD-10-CM

## 2024-10-21 DIAGNOSIS — N183 Chronic kidney disease, stage 3 unspecified: Secondary | ICD-10-CM | POA: Diagnosis not present

## 2024-10-21 LAB — CBC WITH DIFFERENTIAL (CANCER CENTER ONLY)
Abs Immature Granulocytes: 0 K/uL (ref 0.00–0.07)
Basophils Absolute: 0 K/uL (ref 0.0–0.1)
Basophils Relative: 1 %
Eosinophils Absolute: 0.1 K/uL (ref 0.0–0.5)
Eosinophils Relative: 2 %
HCT: 35.2 % — ABNORMAL LOW (ref 36.0–46.0)
Hemoglobin: 10.7 g/dL — ABNORMAL LOW (ref 12.0–15.0)
Immature Granulocytes: 0 %
Lymphocytes Relative: 21 %
Lymphs Abs: 1.2 K/uL (ref 0.7–4.0)
MCH: 26.8 pg (ref 26.0–34.0)
MCHC: 30.4 g/dL (ref 30.0–36.0)
MCV: 88.2 fL (ref 80.0–100.0)
Monocytes Absolute: 0.5 K/uL (ref 0.1–1.0)
Monocytes Relative: 9 %
Neutro Abs: 3.9 K/uL (ref 1.7–7.7)
Neutrophils Relative %: 67 %
Platelet Count: 191 K/uL (ref 150–400)
RBC: 3.99 MIL/uL (ref 3.87–5.11)
RDW: 16 % — ABNORMAL HIGH (ref 11.5–15.5)
WBC Count: 5.7 K/uL (ref 4.0–10.5)
nRBC: 0 % (ref 0.0–0.2)

## 2024-10-21 LAB — IRON AND IRON BINDING CAPACITY (CC-WL,HP ONLY)
Iron: 90 ug/dL (ref 28–170)
Saturation Ratios: 30 % (ref 10.4–31.8)
TIBC: 301 ug/dL (ref 250–450)
UIBC: 211 ug/dL

## 2024-10-21 LAB — CMP (CANCER CENTER ONLY)
ALT: 18 U/L (ref 0–44)
AST: 22 U/L (ref 15–41)
Albumin: 4.1 g/dL (ref 3.5–5.0)
Alkaline Phosphatase: 83 U/L (ref 38–126)
Anion gap: 8 (ref 5–15)
BUN: 31 mg/dL — ABNORMAL HIGH (ref 8–23)
CO2: 25 mmol/L (ref 22–32)
Calcium: 9.6 mg/dL (ref 8.9–10.3)
Chloride: 108 mmol/L (ref 98–111)
Creatinine: 1.33 mg/dL — ABNORMAL HIGH (ref 0.44–1.00)
GFR, Estimated: 43 mL/min — ABNORMAL LOW
Glucose, Bld: 86 mg/dL (ref 70–99)
Potassium: 5 mmol/L (ref 3.5–5.1)
Sodium: 141 mmol/L (ref 135–145)
Total Bilirubin: 0.4 mg/dL (ref 0.0–1.2)
Total Protein: 6.9 g/dL (ref 6.5–8.1)

## 2024-10-21 LAB — FERRITIN: Ferritin: 257 ng/mL (ref 11–307)

## 2024-10-21 MED ORDER — EPOETIN ALFA-EPBX 40000 UNIT/ML IJ SOLN: 40000.0000 [IU] | Freq: Once | INTRAMUSCULAR | Status: DC

## 2024-10-21 MED ORDER — EPOETIN ALFA-EPBX 40000 UNIT/ML IJ SOLN
40000.0000 [IU] | Freq: Once | INTRAMUSCULAR | Status: AC
  Administered 2024-10-21: 40000 [IU] via SUBCUTANEOUS
  Filled 2024-10-21: qty 1

## 2024-10-21 NOTE — Progress Notes (Signed)
 "  Patient Care Team: Clarice Nottingham, MD as PCP - General (Internal Medicine) Mona Vinie BROCKS, MD as PCP - Cardiology (Cardiology) Odean Potts, MD as Consulting Physician (Hematology and Oncology) Curt Lighter, MD as Consulting Physician (Rheumatology) Josephina Aures, MD as Consulting Physician (Endocrinology) Patrcia Sharper, MD as Consulting Physician (Ophthalmology) Jerrye Katheryn BROCKS, MD as Consulting Physician (Nephrology)  DIAGNOSIS:  Encounter Diagnosis  Name Primary?   Anemia of chronic disease Yes    CHIEF COMPLIANT: Follow-up of anemia of chronic disease  HISTORY OF PRESENT ILLNESS:  History of Present Illness Jocelyn Kaufman is a 68 year old female with anemia secondary to stage 3 chronic kidney disease who presents for hematology follow-up and ongoing injection therapy.  She has chronic anemia with hemoglobin typically 10-11 g/dL. She receives regular epoetin  alfa-epbx injections, most recently 11.6 units, and continues iron supplementation and chlorthalidone . Today's hemoglobin is 10.7 g/dL. She is asymptomatic without fatigue or other hematologic symptoms.  She has a benign thyroid  nodule with imaging and biopsy confirming benign pathology as of October 01, 2024.  She had recent falls with fractures of the right foot and left ankle and new hearing loss requiring hearing aids. She also had recent dental crown replacements. She does not need additional prescriptions today.       ALLERGIES:  is allergic to semaglutide (0.25 or 0.5mg -dos), shellfish protein-containing drug products, empagliflozin, semaglutide , tramadol hcl, canagliflozin, gemtesa  [vibegron ], latex, liraglutide, minoxidil, omeprazole, and percocet [oxycodone -acetaminophen ].  MEDICATIONS:  Current Outpatient Medications  Medication Sig Dispense Refill   ACCU-CHEK GUIDE test strip      Accu-Chek Softclix Lancets lancets use to check blood sugar     Aflibercept  (EYLEA ) 2 MG/0.05ML SOLN 2 mg by Intravitreal  route every 3 (three) months.     albuterol  (VENTOLIN  HFA) 108 (90 Base) MCG/ACT inhaler Inhale two puffs every four to six hours as needed for cough or wheeze. 18 g 2   amLODipine  (NORVASC ) 5 MG tablet Take 1 tablet (5 mg total) by mouth daily. 90 tablet 3   aspirin  EC 81 MG tablet Take 81 mg by mouth daily. Swallow whole.     atorvastatin  (LIPITOR) 40 MG tablet Take 1 tablet (40 mg total) by mouth daily. (Patient not taking: Reported on 10/15/2024) 90 tablet 1   Azelaic Acid (FINACEA) 15 % FOAM Apply topically. Apply to scalp twice daily     B-D ULTRAFINE III SHORT PEN 31G X 8 MM MISC SMARTSIG:1 Each SUB-Q Daily     CHELATED IRON PO Take 18 mg by mouth daily.     Cholecalciferol  (VITAMIN D3) 125 MCG (5000 UT) CAPS Take 5,000 Units by mouth daily.     clindamycin  (CLEOCIN  T) 1 % external solution Apply to underarm daily to twice as needed.     CLOBETASOL PROPIONATE E 0.05 % emollient cream Apply topically 3 (three) times daily.     dapagliflozin  propanediol (FARXIGA ) 5 MG TABS tablet Take 1 tablet (5 mg total) by mouth daily before breakfast.     dapagliflozin  propanediol (FARXIGA ) 5 MG TABS tablet Take 1 tablet (5 mg total) by mouth daily. (Patient not taking: Reported on 10/15/2024) 90 tablet 3   Dulaglutide  (TRULICITY ) 1.5 MG/0.5ML SOAJ Inject 1.5 mg into the skin once a week. 6 mL 1   EPINEPHrine  (EPIPEN  2-PAK) 0.3 mg/0.3 mL IJ SOAJ injection Use as directed for life-threatening allergic reaction. 2 each 1   epoetin  alfa-epbx (RETACRIT ) 2000 UNIT/ML injection See admin instructions.     escitalopram  (LEXAPRO ) 20 MG tablet Take  1 tablet (20 mg total) by mouth daily. 90 tablet 3   Evolocumab  (REPATHA  SURECLICK) 140 MG/ML SOAJ Inject 140 mg into the skin every 14 (fourteen) days. 6 mL 3   fexofenadine  (ALLEGRA  ALLERGY) 180 MG tablet Take 1 tablet (180 mg total) by mouth daily.     folic acid  (FOLVITE ) 1 MG tablet Take 1 mg by mouth 2 (two) times daily.     furosemide  (LASIX ) 20 MG tablet Take  one tablet daily for 2 days then as needed for fluid retention. If taking more than 3 times per week, please let cardiology team know. 90 tablet 3   hydrALAZINE  (APRESOLINE ) 50 MG tablet Take 50 mg by mouth 3 (three) times daily. (Patient not taking: Reported on 10/15/2024)     hydrALAZINE  (APRESOLINE ) 50 MG tablet Take 1 tablet (50 mg total) by mouth in the morning, at noon, and at bedtime. 270 tablet 3   HYDROcodone -acetaminophen  (NORCO/VICODIN) 5-325 MG tablet Take 1 tablet by mouth every 4 (four) hours as needed for moderate pain (pain score 4-6) or severe pain (pain score 7-10). 8 tablet 0   hydrocortisone cream 1 % Apply 1 Application topically 2 (two) times daily as needed for itching.     inFLIXimab  (REMICADE ) 100 MG injection Inject into the vein.     insulin  aspart (NOVOLOG ) 100 UNIT/ML FlexPen Inject into the skin See admin instructions. Sliding Scale     insulin  glargine, 2 Unit Dial, (TOUJEO  MAX SOLOSTAR) 300 UNIT/ML Solostar Pen Inject 42 Units into the skin at bedtime.     irbesartan  (AVAPRO ) 300 MG tablet Take 1 tablet (300 mg total) by mouth daily.     KERENDIA 10 MG TABS 10 mg daily.     Lancets Misc. (ACCU-CHEK SOFTCLIX LANCET DEV) KIT See admin instructions.     latanoprost  (XALATAN ) 0.005 % ophthalmic solution Place 1 drop into both eyes at bedtime.     latanoprost  (XALATAN ) 0.005 % ophthalmic solution INSTILL 1 DROP IN BOTH EYES EVERYDAY AT BEDTIME (Patient not taking: Reported on 10/15/2024) 7.5 mL 6   leflunomide  (ARAVA ) 20 MG tablet Take 1 tablet (20 mg total) by mouth daily. 90 tablet 0   lidocaine  (LMX) 4 % cream Apply 1 Application topically as needed (pain).     metoprolol  succinate (TOPROL -XL) 25 MG 24 hr tablet Take 1.5 tablets (37.5 mg total) by mouth daily. May take additional half tablet as needed once daily for palpitations. 135 tablet 3   Multiple Vitamins-Minerals (CENTRUM SILVER  PO) Take 1 tablet by mouth daily with breakfast.     oxybutynin  (DITROPAN -XL) 10 MG  24 hr tablet Take 1 tablet (10 mg total) by mouth daily. (Patient not taking: Reported on 10/15/2024) 30 tablet 11   sulfaSALAzine  (AZULFIDINE ) 500 MG tablet Take 3 tablets (1,500 mg total) by mouth 2 (two) times daily.     sulfaSALAzine  (AZULFIDINE ) 500 MG tablet Take 1 tablet (500 mg total) by mouth 2 (two) times daily. 180 tablet 0   SYNTHROID  125 MCG tablet Take 125 mcg by mouth every Monday, Tuesday, Wednesday, Thursday, and Friday.     triamcinolone  cream (KENALOG ) 0.1 % Apply 1 Application topically 2 (two) times daily as needed (for itching- affected areas).     vitamin B-12 (CYANOCOBALAMIN ) 1000 MCG tablet Take 1,000 mcg by mouth daily.     No current facility-administered medications for this visit.    PHYSICAL EXAMINATION: ECOG PERFORMANCE STATUS: 1 - Symptomatic but completely ambulatory  Vitals:   10/21/24 0824  BP: 118/78  Pulse: 73  Resp: 18  Temp: 97.6 F (36.4 C)  SpO2: 97%   Filed Weights   10/21/24 0824  Weight: 227 lb 9.6 oz (103.2 kg)      LABORATORY DATA:  I have reviewed the data as listed    Latest Ref Rng & Units 09/21/2024    9:03 AM 08/21/2024    8:28 AM 08/14/2024   11:35 AM  CMP  Glucose 70 - 99 mg/dL 91  862  880   BUN 8 - 23 mg/dL 16  32  21   Creatinine 0.44 - 1.00 mg/dL 8.92  8.44  8.90   Sodium 135 - 145 mmol/L 141  140  139   Potassium 3.5 - 5.1 mmol/L 4.3  5.0  4.0   Chloride 98 - 111 mmol/L 107  109  107   CO2 22 - 32 mmol/L 26  25  23    Calcium  8.9 - 10.3 mg/dL 9.5  9.2  9.4   Total Protein 6.5 - 8.1 g/dL 6.9  7.0  6.2   Total Bilirubin 0.0 - 1.2 mg/dL 0.4  0.4  0.6   Alkaline Phos 38 - 126 U/L 93  90  80   AST 15 - 41 U/L 22  17  26    ALT 0 - 44 U/L 17  13  19      Lab Results  Component Value Date   WBC 5.7 10/21/2024   HGB 10.7 (L) 10/21/2024   HCT 35.2 (L) 10/21/2024   MCV 88.2 10/21/2024   PLT 191 10/21/2024   NEUTROABS PENDING 10/21/2024    ASSESSMENT & PLAN:  Anemia of chronic disease Patient has history of  hypothyroidism as well as possible adrenal dysfunction issues for which she goes to [facility]    Etiology: Anemia of chronic disease secondary to chronic kidney disease, hypothyroidism and adrenal issues 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  (Retacrit  injections for hemoglobin 11 or less) Lab review 03/23/2020: Erythropoietin  27.9, hemoglobin 9.6 02/02/2021: Hemoglobin 10.6 (given her severe symptoms continue with Aranesp ) 01/02/2022: Hemoglobin 10.7 (no Retacrit  today): Last Retacrit  was on 12/05/2021  09/19/2022: Hemoglobin 11.2 (Retacrit  given 08/31/2022) 12/18/2022: Hemoglobin 10.6 (recommended that she receive Retacrit  today) 06/25/2023: Hemoglobin 11.2 (held Retacrit ) 07/17/2023: Hemoglobin 10.2 10/08/2023: Hemoglobin 9.9 12/10/2023: Hemoglobin 12.1 (methotrexate  was discontinued in December 2024) 02/04/2024: Hemoglobin 10.3 04/30/2024: Hemoglobin 11.9 (injection Retacrit  not being given today) 07/22/2024: Hemoglobin 10.2 09/21/2024: Hemoglobin 10.1, iron saturation 26%, ferritin 310 creatinine 1.07 10/21/2024: Hemoglobin 10.7      Recheck her labs every 4 weeks and injection appointments if hemoglobin is less than 11  I will see her back in 3 months with labs and follow-up.  Assessment & Plan Anemia in stage 3 chronic kidney disease Chronic anemia secondary to stage 3 CKD is well-managed with stable hemoglobin levels at 10.7 g/dL. No malignancy detected. Current iron and epoetin  alfa-epbx therapy effective. - Administered epoetin  alfa-epbx injection due to hemoglobin <11 g/dL. - Reviewed hemoglobin trends and confirmed stability. - Scheduled monthly appointments for epoetin  alfa-epbx administration. - Scheduled next follow-up in three months.      No orders of the defined types were placed in this encounter.  The patient has a good understanding of the overall plan. she agrees with it. she will call  with any problems that may develop before the next visit here.  I personally spent a total of 30 minutes in the care of the patient  today including preparing to see the patient, getting/reviewing separately obtained history, performing a medically appropriate exam/evaluation, counseling and educating, placing orders, referring and communicating with other health care professionals, documenting clinical information in the EHR, independently interpreting results, communicating results, and coordinating care.   Viinay K Rosenda Geffrard, MD 10/21/2024    "

## 2024-10-21 NOTE — Assessment & Plan Note (Signed)
 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  (Retacrit  injections for hemoglobin 11 or less) Lab review 03/23/2020: Erythropoietin  27.9, hemoglobin 9.6 02/02/2021: Hemoglobin 10.6 (given her severe symptoms continue with Aranesp ) 01/02/2022: Hemoglobin 10.7 (no Retacrit  today): Last Retacrit  was on 12/05/2021  09/19/2022: Hemoglobin 11.2 (Retacrit  given 08/31/2022) 12/18/2022: Hemoglobin 10.6 (recommended that she receive Retacrit  today) 06/25/2023: Hemoglobin 11.2 (held Retacrit ) 07/17/2023: Hemoglobin 10.2 10/08/2023: Hemoglobin 9.9 12/10/2023: Hemoglobin 12.1 (methotrexate  was discontinued in December 2024) 02/04/2024: Hemoglobin 10.3 04/30/2024: Hemoglobin 11.9 (injection Retacrit  not being given today) 07/22/2024: Hemoglobin 10.2 09/21/2024: Hemoglobin 10.1, iron saturation 26%, ferritin 310 creatinine 1.07     Recheck her labs every 4 weeks and injection appointments if hemoglobin is less than 10.6.  I will see her back in 3 months with labs and follow-up.

## 2024-10-26 ENCOUNTER — Other Ambulatory Visit (HOSPITAL_COMMUNITY): Payer: Self-pay

## 2024-11-06 ENCOUNTER — Other Ambulatory Visit (HOSPITAL_COMMUNITY): Payer: Self-pay

## 2024-11-06 ENCOUNTER — Encounter: Payer: Self-pay | Admitting: Hematology and Oncology

## 2024-11-06 MED ORDER — METHOCARBAMOL 750 MG PO TABS
750.0000 mg | ORAL_TABLET | Freq: Three times a day (TID) | ORAL | 0 refills | Status: AC | PRN
  Filled 2024-11-06: qty 30, 10d supply, fill #0

## 2024-11-13 ENCOUNTER — Other Ambulatory Visit: Payer: Self-pay | Admitting: Physician Assistant

## 2024-11-13 DIAGNOSIS — M67911 Unspecified disorder of synovium and tendon, right shoulder: Secondary | ICD-10-CM

## 2024-11-20 ENCOUNTER — Inpatient Hospital Stay

## 2024-11-20 ENCOUNTER — Inpatient Hospital Stay: Attending: Hematology and Oncology

## 2024-11-20 VITALS — BP 150/68 | HR 72 | Temp 99.0°F | Resp 19

## 2024-11-20 DIAGNOSIS — N1831 Chronic kidney disease, stage 3a: Secondary | ICD-10-CM | POA: Insufficient documentation

## 2024-11-20 DIAGNOSIS — E1122 Type 2 diabetes mellitus with diabetic chronic kidney disease: Secondary | ICD-10-CM | POA: Insufficient documentation

## 2024-11-20 DIAGNOSIS — D631 Anemia in chronic kidney disease: Secondary | ICD-10-CM

## 2024-11-20 DIAGNOSIS — D649 Anemia, unspecified: Secondary | ICD-10-CM

## 2024-11-20 DIAGNOSIS — D638 Anemia in other chronic diseases classified elsewhere: Secondary | ICD-10-CM

## 2024-11-20 LAB — CBC WITH DIFFERENTIAL (CANCER CENTER ONLY)
Abs Immature Granulocytes: 0 K/uL (ref 0.00–0.07)
Basophils Absolute: 0 K/uL (ref 0.0–0.1)
Basophils Relative: 1 %
Eosinophils Absolute: 0.1 K/uL (ref 0.0–0.5)
Eosinophils Relative: 2 %
HCT: 34.9 % — ABNORMAL LOW (ref 36.0–46.0)
Hemoglobin: 10.7 g/dL — ABNORMAL LOW (ref 12.0–15.0)
Immature Granulocytes: 0 %
Lymphocytes Relative: 23 %
Lymphs Abs: 1.3 K/uL (ref 0.7–4.0)
MCH: 27.1 pg (ref 26.0–34.0)
MCHC: 30.7 g/dL (ref 30.0–36.0)
MCV: 88.4 fL (ref 80.0–100.0)
Monocytes Absolute: 0.5 K/uL (ref 0.1–1.0)
Monocytes Relative: 9 %
Neutro Abs: 3.6 K/uL (ref 1.7–7.7)
Neutrophils Relative %: 65 %
Platelet Count: 218 K/uL (ref 150–400)
RBC: 3.95 MIL/uL (ref 3.87–5.11)
RDW: 15.9 % — ABNORMAL HIGH (ref 11.5–15.5)
WBC Count: 5.4 K/uL (ref 4.0–10.5)
nRBC: 0 % (ref 0.0–0.2)

## 2024-11-20 LAB — CMP (CANCER CENTER ONLY)
ALT: 19 U/L (ref 0–44)
AST: 25 U/L (ref 15–41)
Albumin: 4 g/dL (ref 3.5–5.0)
Alkaline Phosphatase: 83 U/L (ref 38–126)
Anion gap: 10 (ref 5–15)
BUN: 24 mg/dL — ABNORMAL HIGH (ref 8–23)
CO2: 26 mmol/L (ref 22–32)
Calcium: 9.5 mg/dL (ref 8.9–10.3)
Chloride: 107 mmol/L (ref 98–111)
Creatinine: 1.19 mg/dL — ABNORMAL HIGH (ref 0.44–1.00)
GFR, Estimated: 50 mL/min — ABNORMAL LOW
Glucose, Bld: 99 mg/dL (ref 70–99)
Potassium: 4.5 mmol/L (ref 3.5–5.1)
Sodium: 143 mmol/L (ref 135–145)
Total Bilirubin: 0.3 mg/dL (ref 0.0–1.2)
Total Protein: 6.7 g/dL (ref 6.5–8.1)

## 2024-11-20 LAB — IRON AND IRON BINDING CAPACITY (CC-WL,HP ONLY)
Iron: 73 ug/dL (ref 28–170)
Saturation Ratios: 26 % (ref 10.4–31.8)
TIBC: 277 ug/dL (ref 250–450)
UIBC: 204 ug/dL

## 2024-11-20 LAB — FERRITIN: Ferritin: 234 ng/mL (ref 11–307)

## 2024-11-20 MED ORDER — EPOETIN ALFA-EPBX 40000 UNIT/ML IJ SOLN
40000.0000 [IU] | Freq: Once | INTRAMUSCULAR | Status: AC
  Administered 2024-11-20: 40000 [IU] via SUBCUTANEOUS
  Filled 2024-11-20: qty 1

## 2024-11-26 ENCOUNTER — Other Ambulatory Visit (HOSPITAL_COMMUNITY): Payer: Self-pay

## 2024-11-26 MED ORDER — TACROLIMUS 0.03 % EX OINT
TOPICAL_OINTMENT | CUTANEOUS | 1 refills | Status: AC
  Filled 2024-11-26: qty 30, 30d supply, fill #0

## 2024-11-27 ENCOUNTER — Other Ambulatory Visit (HOSPITAL_COMMUNITY): Payer: Self-pay

## 2024-12-01 ENCOUNTER — Other Ambulatory Visit (HOSPITAL_COMMUNITY): Payer: Self-pay

## 2024-12-01 MED ORDER — INSULIN ASPART FLEXPEN 100 UNIT/ML ~~LOC~~ SOPN
2.0000 [IU] | PEN_INJECTOR | Freq: Three times a day (TID) | SUBCUTANEOUS | 1 refills | Status: AC
  Filled 2024-12-01: qty 12, 100d supply, fill #0

## 2024-12-03 ENCOUNTER — Other Ambulatory Visit (HOSPITAL_COMMUNITY): Payer: Self-pay

## 2024-12-08 ENCOUNTER — Other Ambulatory Visit

## 2024-12-09 ENCOUNTER — Ambulatory Visit: Admitting: Podiatry

## 2024-12-18 ENCOUNTER — Inpatient Hospital Stay: Attending: Hematology and Oncology

## 2024-12-18 ENCOUNTER — Inpatient Hospital Stay

## 2025-01-18 ENCOUNTER — Inpatient Hospital Stay

## 2025-01-18 ENCOUNTER — Inpatient Hospital Stay: Attending: Hematology and Oncology | Admitting: Hematology and Oncology

## 2025-01-19 ENCOUNTER — Ambulatory Visit (HOSPITAL_BASED_OUTPATIENT_CLINIC_OR_DEPARTMENT_OTHER): Admitting: Family

## 2025-02-15 ENCOUNTER — Inpatient Hospital Stay

## 2025-03-15 ENCOUNTER — Inpatient Hospital Stay

## 2025-04-12 ENCOUNTER — Inpatient Hospital Stay

## 2025-04-12 ENCOUNTER — Inpatient Hospital Stay: Admitting: Hematology and Oncology

## 2025-10-18 ENCOUNTER — Ambulatory Visit: Admitting: Adult Health
